# Patient Record
Sex: Male | Born: 1975
Health system: Southern US, Community
[De-identification: ages and names within clinical notes are randomized; demographics above are authoritative.]

## PROBLEM LIST (undated history)

## (undated) DIAGNOSIS — I10 Essential (primary) hypertension: Secondary | ICD-10-CM

## (undated) DIAGNOSIS — J45909 Unspecified asthma, uncomplicated: Secondary | ICD-10-CM

## (undated) DIAGNOSIS — J302 Other seasonal allergic rhinitis: Secondary | ICD-10-CM

## (undated) HISTORY — PX: OTHER SURGICAL HISTORY: SHX169

## (undated) HISTORY — DX: Unspecified asthma, uncomplicated: J45.909

## (undated) MED FILL — Dupilumab Subcutaneous Soln Auto-injector 300 MG/2ML: SUBCUTANEOUS | Fill #8 | Status: CN

---

## 2000-11-13 ENCOUNTER — Encounter: Payer: Self-pay | Admitting: *Deleted

## 2000-11-14 ENCOUNTER — Encounter: Payer: Self-pay | Admitting: Family Medicine

## 2000-11-14 ENCOUNTER — Inpatient Hospital Stay (HOSPITAL_COMMUNITY): Admission: RE | Admit: 2000-11-14 | Discharge: 2000-11-18 | Payer: Self-pay | Admitting: Family Medicine

## 2000-12-10 ENCOUNTER — Emergency Department (HOSPITAL_COMMUNITY): Admission: EM | Admit: 2000-12-10 | Discharge: 2000-12-10 | Payer: Self-pay | Admitting: *Deleted

## 2001-05-09 ENCOUNTER — Emergency Department (HOSPITAL_COMMUNITY): Admission: EM | Admit: 2001-05-09 | Discharge: 2001-05-09 | Payer: Self-pay | Admitting: Emergency Medicine

## 2001-10-19 ENCOUNTER — Emergency Department (HOSPITAL_COMMUNITY): Admission: EM | Admit: 2001-10-19 | Discharge: 2001-10-19 | Payer: Self-pay | Admitting: Emergency Medicine

## 2002-04-11 ENCOUNTER — Emergency Department (HOSPITAL_COMMUNITY): Admission: EM | Admit: 2002-04-11 | Discharge: 2002-04-11 | Payer: Self-pay | Admitting: *Deleted

## 2002-07-12 ENCOUNTER — Encounter: Payer: Self-pay | Admitting: Emergency Medicine

## 2002-07-12 ENCOUNTER — Emergency Department (HOSPITAL_COMMUNITY): Admission: EM | Admit: 2002-07-12 | Discharge: 2002-07-12 | Payer: Self-pay | Admitting: Emergency Medicine

## 2003-01-02 ENCOUNTER — Emergency Department (HOSPITAL_COMMUNITY): Admission: EM | Admit: 2003-01-02 | Discharge: 2003-01-03 | Payer: Self-pay | Admitting: Emergency Medicine

## 2007-02-15 ENCOUNTER — Emergency Department (HOSPITAL_COMMUNITY): Admission: EM | Admit: 2007-02-15 | Discharge: 2007-02-15 | Payer: Self-pay | Admitting: Emergency Medicine

## 2007-09-27 ENCOUNTER — Emergency Department (HOSPITAL_COMMUNITY): Admission: EM | Admit: 2007-09-27 | Discharge: 2007-09-28 | Payer: Self-pay | Admitting: Emergency Medicine

## 2008-04-20 ENCOUNTER — Emergency Department (HOSPITAL_COMMUNITY): Admission: EM | Admit: 2008-04-20 | Discharge: 2008-04-20 | Payer: Self-pay | Admitting: Emergency Medicine

## 2009-03-19 ENCOUNTER — Encounter: Admission: RE | Admit: 2009-03-19 | Discharge: 2009-03-19 | Payer: Self-pay | Admitting: Family Medicine

## 2009-06-08 ENCOUNTER — Emergency Department (HOSPITAL_COMMUNITY): Admission: EM | Admit: 2009-06-08 | Discharge: 2009-06-08 | Payer: Self-pay | Admitting: Emergency Medicine

## 2009-07-21 ENCOUNTER — Emergency Department (HOSPITAL_COMMUNITY): Admission: EM | Admit: 2009-07-21 | Discharge: 2009-07-21 | Payer: Self-pay | Admitting: Emergency Medicine

## 2009-10-06 ENCOUNTER — Emergency Department (HOSPITAL_COMMUNITY): Admission: EM | Admit: 2009-10-06 | Discharge: 2009-10-06 | Payer: Self-pay | Admitting: Emergency Medicine

## 2009-10-15 ENCOUNTER — Emergency Department (HOSPITAL_COMMUNITY): Admission: EM | Admit: 2009-10-15 | Discharge: 2009-10-16 | Payer: Self-pay | Admitting: Emergency Medicine

## 2009-10-16 ENCOUNTER — Emergency Department (HOSPITAL_COMMUNITY): Admission: EM | Admit: 2009-10-16 | Discharge: 2009-10-16 | Payer: Self-pay | Admitting: Emergency Medicine

## 2009-12-24 ENCOUNTER — Encounter: Admission: RE | Admit: 2009-12-24 | Discharge: 2009-12-24 | Payer: Self-pay | Admitting: Internal Medicine

## 2010-02-06 ENCOUNTER — Emergency Department (HOSPITAL_COMMUNITY): Admission: EM | Admit: 2010-02-06 | Discharge: 2010-02-06 | Payer: Self-pay | Admitting: Emergency Medicine

## 2010-03-12 ENCOUNTER — Emergency Department (HOSPITAL_COMMUNITY): Admission: EM | Admit: 2010-03-12 | Discharge: 2010-03-12 | Payer: Self-pay | Admitting: Emergency Medicine

## 2010-04-03 ENCOUNTER — Emergency Department (HOSPITAL_COMMUNITY): Admission: EM | Admit: 2010-04-03 | Discharge: 2010-04-03 | Payer: Self-pay | Admitting: Emergency Medicine

## 2010-04-11 ENCOUNTER — Emergency Department (HOSPITAL_COMMUNITY)
Admission: EM | Admit: 2010-04-11 | Discharge: 2010-04-12 | Payer: Self-pay | Source: Home / Self Care | Admitting: Emergency Medicine

## 2010-04-19 ENCOUNTER — Observation Stay (HOSPITAL_COMMUNITY): Admission: EM | Admit: 2010-04-19 | Discharge: 2010-04-20 | Payer: Self-pay | Source: Home / Self Care

## 2010-05-20 ENCOUNTER — Emergency Department (HOSPITAL_COMMUNITY)
Admission: EM | Admit: 2010-05-20 | Discharge: 2010-05-20 | Payer: Self-pay | Source: Home / Self Care | Admitting: Emergency Medicine

## 2010-05-24 ENCOUNTER — Emergency Department (HOSPITAL_COMMUNITY)
Admission: EM | Admit: 2010-05-24 | Discharge: 2010-05-24 | Payer: Self-pay | Source: Home / Self Care | Admitting: Emergency Medicine

## 2010-06-17 ENCOUNTER — Emergency Department (HOSPITAL_COMMUNITY): Payer: PRIVATE HEALTH INSURANCE

## 2010-06-17 ENCOUNTER — Emergency Department (HOSPITAL_COMMUNITY)
Admission: EM | Admit: 2010-06-17 | Discharge: 2010-06-17 | Disposition: A | Discharge status: Home / Self Care | Payer: PRIVATE HEALTH INSURANCE | Attending: Emergency Medicine | Admitting: Emergency Medicine

## 2010-06-17 DIAGNOSIS — J45909 Unspecified asthma, uncomplicated: Secondary | ICD-10-CM | POA: Insufficient documentation

## 2010-06-17 DIAGNOSIS — R0789 Other chest pain: Secondary | ICD-10-CM | POA: Insufficient documentation

## 2010-06-17 DIAGNOSIS — J40 Bronchitis, not specified as acute or chronic: Secondary | ICD-10-CM | POA: Insufficient documentation

## 2010-06-17 DIAGNOSIS — R059 Cough, unspecified: Secondary | ICD-10-CM | POA: Insufficient documentation

## 2010-06-17 DIAGNOSIS — R05 Cough: Secondary | ICD-10-CM | POA: Insufficient documentation

## 2010-07-05 ENCOUNTER — Emergency Department (HOSPITAL_COMMUNITY)
Admission: EM | Admit: 2010-07-05 | Discharge: 2010-07-05 | Disposition: A | Discharge status: Home / Self Care | Payer: PRIVATE HEALTH INSURANCE | Attending: Emergency Medicine | Admitting: Emergency Medicine

## 2010-07-05 ENCOUNTER — Emergency Department (HOSPITAL_COMMUNITY): Payer: PRIVATE HEALTH INSURANCE

## 2010-07-05 DIAGNOSIS — J45901 Unspecified asthma with (acute) exacerbation: Secondary | ICD-10-CM | POA: Insufficient documentation

## 2010-07-05 DIAGNOSIS — R0989 Other specified symptoms and signs involving the circulatory and respiratory systems: Secondary | ICD-10-CM | POA: Insufficient documentation

## 2010-07-05 DIAGNOSIS — R0609 Other forms of dyspnea: Secondary | ICD-10-CM | POA: Insufficient documentation

## 2010-07-16 ENCOUNTER — Encounter: Payer: Self-pay | Admitting: Pulmonary Disease

## 2010-07-16 ENCOUNTER — Institutional Professional Consult (permissible substitution) (INDEPENDENT_AMBULATORY_CARE_PROVIDER_SITE_OTHER): Payer: PRIVATE HEALTH INSURANCE | Admitting: Pulmonary Disease

## 2010-07-16 DIAGNOSIS — J4550 Severe persistent asthma, uncomplicated: Secondary | ICD-10-CM | POA: Insufficient documentation

## 2010-07-16 DIAGNOSIS — J45909 Unspecified asthma, uncomplicated: Secondary | ICD-10-CM

## 2010-07-16 HISTORY — DX: Severe persistent asthma, uncomplicated: J45.50

## 2010-07-20 LAB — BASIC METABOLIC PANEL
CO2: 25 mEq/L (ref 19–32)
Calcium: 9.5 mg/dL (ref 8.4–10.5)
Chloride: 107 mEq/L (ref 96–112)
Creatinine, Ser: 0.96 mg/dL (ref 0.4–1.5)
Potassium: 4.5 mEq/L (ref 3.5–5.1)

## 2010-07-20 LAB — URINALYSIS, ROUTINE W REFLEX MICROSCOPIC
Bilirubin Urine: NEGATIVE
Leukocytes, UA: NEGATIVE
Nitrite: NEGATIVE
Protein, ur: NEGATIVE mg/dL
Specific Gravity, Urine: 1.015 (ref 1.005–1.030)
pH: 7 (ref 5.0–8.0)

## 2010-07-20 LAB — DIFFERENTIAL
Basophils Relative: 0 % (ref 0–1)
Eosinophils Relative: 0 % (ref 0–5)
Lymphocytes Relative: 2 % — ABNORMAL LOW (ref 12–46)
Lymphs Abs: 0.2 10*3/uL — ABNORMAL LOW (ref 0.7–4.0)

## 2010-07-20 LAB — CBC
HCT: 42.6 % (ref 39.0–52.0)
MCHC: 34 g/dL (ref 30.0–36.0)
Platelets: 203 10*3/uL (ref 150–400)
RBC: 4.96 MIL/uL (ref 4.22–5.81)

## 2010-07-20 LAB — RAPID URINE DRUG SCREEN, HOSP PERFORMED: Opiates: NOT DETECTED

## 2010-07-21 LAB — CBC
Platelets: 170 10*3/uL (ref 150–400)
RDW: 13.2 % (ref 11.5–15.5)

## 2010-07-21 LAB — DIFFERENTIAL
Basophils Absolute: 0 10*3/uL (ref 0.0–0.1)
Lymphs Abs: 2.5 10*3/uL (ref 0.7–4.0)
Monocytes Relative: 6 % (ref 3–12)
Neutro Abs: 1.6 10*3/uL — ABNORMAL LOW (ref 1.7–7.7)
Neutrophils Relative %: 35 % — ABNORMAL LOW (ref 43–77)

## 2010-07-21 LAB — BASIC METABOLIC PANEL
CO2: 25 mEq/L (ref 19–32)
GFR calc Af Amer: >60 mL/min (ref >60)
Glucose, Bld: 99 mg/dL (ref 70–99)
Sodium: 141 mEq/L (ref 135–145)

## 2010-07-26 LAB — CBC
HCT: 41.2 % (ref 39.0–52.0)
MCHC: 33 g/dL (ref 30.0–36.0)
MCV: 92.2 fL (ref 78.0–100.0)
Platelets: 162 10*3/uL (ref 150–400)
WBC: 5.3 10*3/uL (ref 4.0–10.5)

## 2010-07-26 LAB — BASIC METABOLIC PANEL
GFR calc Af Amer: >60 mL/min (ref >60)
GFR calc non Af Amer: >60 mL/min (ref >60)

## 2010-07-26 LAB — DIFFERENTIAL
Lymphs Abs: 2.7 10*3/uL (ref 0.7–4.0)
Monocytes Relative: 8 % (ref 3–12)

## 2010-07-27 ENCOUNTER — Telehealth (INDEPENDENT_AMBULATORY_CARE_PROVIDER_SITE_OTHER): Payer: Self-pay | Admitting: *Deleted

## 2010-07-27 LAB — RAPID STREP SCREEN (MED CTR MEBANE ONLY): Streptococcus, Group A Screen (Direct): POSITIVE — AB

## 2010-07-28 NOTE — Assessment & Plan Note (Signed)
Summary: consult for asthma    Visit Type:  Initial Consult Copy to:  Renaye Rakers MD Primary Provider/Referring Provider:  Renaye Rakers MD  CC:  Pulmonary Consult. Pt states he has been having issues with his asthma. he is staying sob w/ activity and at rest and wheezing x couple months.  History of Present Illness: the pt is a 35y/o male who I have been asked to see for asthma.  The pt tells me he was diagnosed with asthma in Nov of last year, but has never had pfts.  At the time of his diagnosis, he was having sob, chest tightness, and was started on asthma meds.  Despite being compliant with his meds, he continues to have episodes of sob which can come on with rest or activity.  These are associated with chest tightness and audible wheezing.  He states he has one episode at least every other week, and he typically goes to hospital.  It is not improved by his rescue inhaler.  The pt is currently on decadron for a recent "flareup".  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  He also has chronic nasal congestion with allergic rhinitis symptoms, but has never been allergy tested.  He denies any history of reflux.  He did have a cxr last week at [facility] which showed hyperinflation but on acute process.   Preventive Screening-Counseling & Management  Alcohol-Tobacco     Smoking Status: quit  Current Medications (verified): 1)  Singulair 10 Mg Tabs (Montelukast Sodium) .Marland Kitchen.. 1 At Bedtime 2)  Symbicort 160-4.5 Mcg/act Aero (Budesonide-Formoterol Fumarate) .Marland Kitchen.. 1 Puff Once A Day 3)  Ventolin Hfa 108 (90 Base) Mcg/act Aers (Albuterol Sulfate) .... 2 Puffs As Needed 4)  Decadron (Unsure Dosage) .Marland Kitchen.. 1 Tablet Two Times A Day X 7 Days Then 1 Tablet Once Daily X 6 Days 5)  Ibuprofen 800 Mg Tabs (Ibuprofen) .... As Needed  Allergies (verified): 1)  ! Pcn  Past History:  Past Medical History: Asthma  Past Surgical History: none  Family  History: Reviewed history and no changes required. emphysema: father  Social History: Reviewed history and no changes required. Patient states former smoker. quit 04/09/2010. <1/2 ppd. started age 35 married occupation: sanitation Smoking Status:  quit  Review of Systems       The patient complains of shortness of breath with activity, shortness of breath at rest, productive cough, non-productive cough, tooth/dental problems, nasal congestion/difficulty breathing through nose, and change in color of mucus.  The patient denies coughing up blood, chest pain, irregular heartbeats, acid heartburn, indigestion, loss of appetite, weight change, abdominal pain, difficulty swallowing, sore throat, headaches, sneezing, itching, ear ache, anxiety, depression, hand/feet swelling, joint stiffness or pain, rash, and fever.    Vital Signs:  Patient profile:   35 year old male Height:      73 inches Weight:      175.38 pounds BMI:     23.22 O2 Sat:      96 % on Room air Temp:     97.8 degrees F oral Pulse rate:   70 / minute BP sitting:   124 / 72  (left arm) Cuff size:   regular  Vitals Entered By: Carver Fila (July 16, 2010 10:43 AM)  O2 Flow:  Room air CC: Pulmonary Consult. Pt states he has been having issues with his asthma. he is staying sob w/ activity and at rest, wheezing x couple months Comments meds  and allergies updated Phone number updated Carver Fila  July 16, 2010 10:44 AM    Physical Exam  General:  thin male in nad  Eyes:  PERRLA and EOMI.   Nose:  mild septal deviation to left with no obstruction no purulence or discharge Mouth:  clear, no lesons or exudates Neck:  no jvd, tmg, LN Lungs:  totally clear to auscultation  Heart:  rrr, no mrg  Abdomen:  soft and nontender, bs + Extremities:  no edema or cyanosis  pulses intact distally Neurologic:  alert and oriented, moves all 4    Impression & Recommendations:  Problem # 1:  ASTHMA (ICD-493.90) the pt has been  diagnosed with asthma on clinical grounds, but has never had pfts for documentation of airflow obstruction.  He is on a very good asthma regimen, and ongoing flareups in face of good medical compliance would make me very suspicious we are not dealing with asthma.  However, he does work in an environment with strong chemicals, and may have uncontrolled allergy symptoms which can cause flareups.  At this point, I have asked him to finish up his steroid taper, and need to see him asap next time he has worsening symptoms.  Will be able to do spirometry at that time to verify whether he truly has airflow obstruction.  I have asked him to call us for work in appt rather than going to ER (unless of course he is in distress).    Medications Added to Medication List This Visit: 1)  Singulair 10 Mg Tabs (Montelukast sodium) .Marland Kitchen.. 1 at bedtime 2)  Symbicort 160-4.5 Mcg/act Aero (Budesonide-formoterol fumarate) .Marland Kitchen.. 1 puff once a day 3)  Ventolin Hfa 108 (90 Base) Mcg/act Aers (Albuterol sulfate) .... 2 puffs as needed 4)  Decadron (unsure Dosage)  .Marland Kitchen.. 1 tablet two times a day x 7 days then 1 tablet once daily x 6 days 5)  Ibuprofen 800 Mg Tabs (Ibuprofen) .... As needed  Other Orders: Consultation Level IV (04540)  Patient Instructions: 1)  stay on symbicort 2puffs am and pm..rinse mouth well.  Take everyday no matter what 2)  stay on singulair 3)  finish up your decadron 4)  followup with me in 3 weeks, but call if start getting sick so we can get you in that day or within 24hrs.

## 2010-07-29 ENCOUNTER — Emergency Department (HOSPITAL_COMMUNITY): Payer: PRIVATE HEALTH INSURANCE

## 2010-07-29 ENCOUNTER — Emergency Department (HOSPITAL_COMMUNITY)
Admission: EM | Admit: 2010-07-29 | Discharge: 2010-07-29 | Disposition: A | Discharge status: Home / Self Care | Payer: PRIVATE HEALTH INSURANCE | Attending: Emergency Medicine | Admitting: Emergency Medicine

## 2010-07-29 DIAGNOSIS — J209 Acute bronchitis, unspecified: Secondary | ICD-10-CM | POA: Insufficient documentation

## 2010-07-29 DIAGNOSIS — R05 Cough: Secondary | ICD-10-CM | POA: Insufficient documentation

## 2010-07-29 DIAGNOSIS — R079 Chest pain, unspecified: Secondary | ICD-10-CM | POA: Insufficient documentation

## 2010-07-29 DIAGNOSIS — R059 Cough, unspecified: Secondary | ICD-10-CM | POA: Insufficient documentation

## 2010-07-29 LAB — CBC
HCT: 47.1 % (ref 39.0–52.0)
Hemoglobin: 15.7 g/dL (ref 13.0–17.0)
MCH: 30.1 pg (ref 26.0–34.0)
MCHC: 33.3 g/dL (ref 30.0–36.0)
MCV: 90.2 fL (ref 78.0–100.0)

## 2010-07-29 LAB — DIFFERENTIAL
Basophils Relative: 1 % (ref 0–1)
Lymphocytes Relative: 16 % (ref 12–46)
Monocytes Absolute: 0.5 10*3/uL (ref 0.1–1.0)
Monocytes Relative: 6 % (ref 3–12)
Neutro Abs: 5.8 10*3/uL (ref 1.7–7.7)

## 2010-07-29 LAB — POCT CARDIAC MARKERS: Myoglobin, poc: 68.5 ng/mL (ref 12–200)

## 2010-07-29 LAB — BASIC METABOLIC PANEL
CO2: 22 mEq/L (ref 19–32)
Calcium: 9.4 mg/dL (ref 8.4–10.5)
Creatinine, Ser: 1.04 mg/dL (ref 0.4–1.5)
Glucose, Bld: 121 mg/dL — ABNORMAL HIGH (ref 70–99)

## 2010-08-04 ENCOUNTER — Encounter: Payer: Self-pay | Admitting: Pulmonary Disease

## 2010-08-06 ENCOUNTER — Encounter: Payer: Self-pay | Admitting: Pulmonary Disease

## 2010-08-06 ENCOUNTER — Ambulatory Visit (INDEPENDENT_AMBULATORY_CARE_PROVIDER_SITE_OTHER): Payer: PRIVATE HEALTH INSURANCE | Admitting: Pulmonary Disease

## 2010-08-06 VITALS — BP 120/68 | HR 65 | Temp 98.2°F | Ht 75.0 in | Wt 180.2 lb

## 2010-08-06 DIAGNOSIS — J45909 Unspecified asthma, uncomplicated: Secondary | ICD-10-CM

## 2010-08-06 NOTE — Assessment & Plan Note (Signed)
The pt has had another acute sob since the last visit that does not really sound like an "asthma attack".  He is on excellent meds for asthma, but continues to have issues that may or may not be due to asthma.  He is either on prednisone, or just finishing a taper from the ER, and therefore it is difficult to get proper testing done.  He needs spirometry when he is having issues, and would also like to check a RAST to see if allergy is driving this.  He is having sinus symptoms today, so will check ct sinuses.   If pt comes in for acute visit and doesn't see me:  Needs RAST/IgE and spirometry.

## 2010-08-06 NOTE — Progress Notes (Signed)
Summary: would like to be seen today - LMTCB x 1  Phone Note Call from Patient   Caller: spouse/Susan Call For: Clance Summary of Call: Pts wife Darl Pikes phoned stated that Mr. Tullier is worse, he is having difficulty catching his breath. He stated that Dr Shelle Iron told him to call back and get worked in if he couldnt wait until his follow up 3/29. They would like to be seen today and can be reached at [phone] Initial call taken by: Vedia Coffer,  July 27, 2010 1:39 PM  Follow-up for Phone Call        Called # provided above.  Spoke with pt's wife.  States pt was told if he could not wait until appt on 3/29 to call back and he could be worked in sooner.  I then attempted to ask her if he is having problems but she said "but nevermind."  The call then ended.  I called # back - [facility]  Gweneth Dimitri RN  July 27, 2010 3:46 PM   Additional Follow-up for Phone Call Additional follow up Details #1::        will sign off on message and pt still has appt set for 08-06-10.Reynaldo Minium CMA  July 28, 2010 6:07 PM

## 2010-08-06 NOTE — Progress Notes (Signed)
  Subjective:    Patient ID: Drew Sandoval, male    DOB: [birth year], 35 y.o.   MRN: [id number]  HPI The pt comes in today for f/u of his ?asthma.  The plan was for the pt to come in today off prednisone and have spirometry and possibly rast.  He was in bed one night, and suddenly awakened with sob.  He went to the ER and was treated like an "asthma attack".  By history, this is not suggestive of acute exacerbation (?panic).  He has been treated with prednisone and feels he is at his usual baseline.  He is noting sinus pressure, congestion, and some scant purulence at times.   Review of Systems  Constitutional: Negative for fever, appetite change and unexpected weight change.  HENT: Positive for congestion, rhinorrhea, sneezing, dental problem and postnasal drip. Negative for ear pain, sore throat and trouble swallowing.   Eyes: Negative for redness.  Respiratory: Positive for shortness of breath and wheezing. Negative for cough.   Cardiovascular: Positive for palpitations. Negative for chest pain and leg swelling.  Gastrointestinal: Negative for nausea, abdominal pain and diarrhea.  Genitourinary: Negative for dysuria.  Musculoskeletal: Negative for joint swelling.  Skin: Negative for rash.  Neurological: Positive for headaches. Negative for syncope.  Hematological: Does not bruise/bleed easily.  Psychiatric/Behavioral: Negative for dysphoric mood. The patient is not nervous/anxious.        Objective:   Physical Exam Thin male in nad Nares with inflammation, edema, no purulence OP with no lesions or exudates No LN Chest totally clear to auscultation Cor with rrr, no mrg No edema or cyanosis  Alert and oriented, moves all 4        Assessment & Plan:

## 2010-08-06 NOTE — Patient Instructions (Signed)
No change in asthma meds.  Will check a scan of your sinuses followup with me in 3 weeks, but call for apptm if you get sick in the interim.

## 2010-08-07 ENCOUNTER — Ambulatory Visit (INDEPENDENT_AMBULATORY_CARE_PROVIDER_SITE_OTHER)
Admission: RE | Admit: 2010-08-07 | Discharge: 2010-08-07 | Disposition: A | Discharge status: Home / Self Care | Payer: PRIVATE HEALTH INSURANCE | Source: Ambulatory Visit | Attending: Pulmonary Disease | Admitting: Pulmonary Disease

## 2010-08-07 DIAGNOSIS — J45909 Unspecified asthma, uncomplicated: Secondary | ICD-10-CM

## 2010-08-11 ENCOUNTER — Ambulatory Visit (INDEPENDENT_AMBULATORY_CARE_PROVIDER_SITE_OTHER): Payer: PRIVATE HEALTH INSURANCE | Admitting: Adult Health

## 2010-08-11 ENCOUNTER — Other Ambulatory Visit (INDEPENDENT_AMBULATORY_CARE_PROVIDER_SITE_OTHER): Payer: PRIVATE HEALTH INSURANCE

## 2010-08-11 ENCOUNTER — Encounter: Payer: Self-pay | Admitting: Adult Health

## 2010-08-11 ENCOUNTER — Telehealth: Payer: Self-pay | Admitting: Pulmonary Disease

## 2010-08-11 DIAGNOSIS — J45909 Unspecified asthma, uncomplicated: Secondary | ICD-10-CM

## 2010-08-11 LAB — CBC WITH DIFFERENTIAL/PLATELET
Basophils Absolute: 0 10*3/uL (ref 0.0–0.1)
Eosinophils Absolute: 0.6 10*3/uL (ref 0.0–0.7)
Lymphocytes Relative: 19.3 % (ref 12.0–46.0)
MCHC: 34.1 g/dL (ref 30.0–36.0)
Monocytes Absolute: 0.7 10*3/uL (ref 0.1–1.0)
Neutro Abs: 4.3 10*3/uL (ref 1.4–7.7)
Neutrophils Relative %: 61.6 % (ref 43.0–77.0)
RDW: 14.3 % (ref 11.5–14.6)

## 2010-08-11 MED ORDER — PREDNISONE 10 MG PO TABS: ORAL_TABLET | ORAL | Status: AC

## 2010-08-11 MED ORDER — LEVOFLOXACIN 500 MG PO TABS: 500.0000 mg | ORAL_TABLET | Freq: Every day | ORAL | Status: AC

## 2010-08-11 NOTE — Telephone Encounter (Signed)
Called, spoke with pt.  He c/o increased SOB, wheezing, chest tightness, nonprod cough.  Onset last night.  Requesting appt.  Scheduled with TP for today at 3:30pm -- pt aware.  ED/UC advised if sxs worsen prior to OV.  He verbalized understanding of this.

## 2010-08-11 NOTE — Assessment & Plan Note (Addendum)
Flare: ?COPD component -he started smoking at age 35 (smoked 23+yrs ~1 PPD ) -send Alpha 1 today  FEV1 today 1.14 l/m (28%) Will need to be sorted out what is fixed and reversible once flare is over.  Will set up for full PFTS  Recent CT sinus shows acute sinusitis- will tx with abx,   Plan:  Levaquin 500mg  daily for 10 days  Mucinex DM Twice daily  As needed  Cough/congestion  Prednisone taper over next week.  Saline nasal rinses As needed   Albuterol inhaler 2 puffs every 4hr as needed for wheezing  I will call with blood work  We are sending genetic test for Emphysema  follow up in 2 weeks Dr. Shelle Iron as planned and As needed   follow up for PFT -lung function test -first available.  Please contact office for sooner follow up if symptoms do not improve or worsen or seek emergency care

## 2010-08-11 NOTE — Progress Notes (Signed)
Subjective:    Patient ID: Drew Sandoval, male    DOB: 07-07-1975, 35 y.o.   MRN: [id number]  HPI 35 yo male with dx of Asthma seen for initial pulmonary consult on 07/16/10.   07/16/10 -the pt is a 34y/o male who I have been asked to see for asthma. The pt tells me he was diagnosed with asthma in Nov of last year, but has never had pfts. At the time of his diagnosis, he was having sob, chest tightness, and was started on asthma meds. Despite being compliant with his meds, he continues to have episodes of sob which can come on with rest or activity. These are associated with chest tightness and audible wheezing. He states he has one episode at least every other week, and he typically goes to hospital. It is not improved by his rescue inhaler. The pt is currently on decadron for a recent "flareup". 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 He also has chronic nasal congestion with allergic rhinitis symptoms, but has never been allergy tested. He denies any history of reflux. He did have a cxr last week at [facility] which showed hyperinflation but on acute process.  REC: tapered off steroids, cont on symbicort.   08/11/2010 Acute OV- Presents for 1 day of acute cough-minimally productive, with wheezing and shortness of  Breath. Has increased albuterol use, 3 x yesterday. No fever, discolored mucus. Has a lot of drainage, post nasal drip. Says he has not missed any doses of Symbicort although he admits he depends on samples. Denies reflux or chest pain. Had CT sinus (3/30) which showed :  fluid within the right maxillary sinus and the sphenoid sinus consistent with sinusitis. He says he started smoking at age 35- and he quit few months ago. Father has empysema dx in early 79s. Today we did spirometry which showed FEV1 ~28% (although he is having acute flare and sinusitis).       Review of Systems Constitutional:   No  weight loss, night sweats,  Fevers,  chills, fatigue, or  lassitude.  HEENT:   No headaches,  Difficulty swallowing,  Tooth/dental problems, or  Sore throat,                Positive sneezing, itching, ear ache, nasal congestion, post nasal drip,   CV:  No chest pain,  Orthopnea, PND, swelling in lower extremities, anasarca, dizziness, palpitations, syncope.   GI  No heartburn, indigestion, abdominal pain, nausea, vomiting, diarrhea, change in bowel habits, loss of appetite, bloody stools.   Resp:    No coughing up of blood.  No change in color of mucus.     No chest wall deformity  Skin: no rash or lesions.  GU: no dysuria, change in color of urine, no urgency or frequency.  No flank pain, no hematuria   MS:  No joint pain or swelling.  No decreased range of motion.  No back pain.  Psych:  No change in mood or affect. No depression or anxiety.  No memory loss.         Objective:   Physical Exam GEN: A/Ox3; pleasant , NAD, well nourished   HEENT:  [place]/AT,  EACs-clear, TMs-wnl, NOSE-clear drainge, nontender sinus  THROAT-clear, no lesions, no postnasal drip or exudate noted.   NECK:  Supple w/ fair ROM; no JVD; normal carotid impulses w/o bruits; no thyromegaly or nodules palpated; no lymphadenopathy.  RESP  Coarse BS w/ faint exp  wheeze.no accessory muscle use, no dullness to percussion  CARD:  RRR, no m/r/g  , no peripheral edema, pulses intact, no cyanosis or clubbing.  GI:   Soft & nt; nml bowel sounds; no organomegaly or masses detected.  Musco: Warm bil, no deformities or joint swelling noted.   Neuro: alert, no focal deficits noted.    Skin: Warm, no lesions or rashes          Assessment & Plan:

## 2010-08-11 NOTE — Patient Instructions (Addendum)
Levaquin 500mg  daily for 10 days  Mucinex DM Twice daily  As needed  Cough/congestion  Prednisone taper over next week.  Saline nasal rinses As needed   Albuterol inhaler 2 puffs every 4hr as needed for wheezing  I will call with blood work  We are sending genetic test for Emphysema  follow up in 2 weeks Dr. Shelle Iron as planned and As needed   follow up for PFT -lung function test -first available.  Please contact office for sooner follow up if symptoms do not improve or worsen or seek emergency care

## 2010-08-13 ENCOUNTER — Telehealth: Payer: Self-pay | Admitting: Pulmonary Disease

## 2010-08-13 MED ORDER — CLARITHROMYCIN 500 MG PO TABS: 500.0000 mg | ORAL_TABLET | Freq: Two times a day (BID) | ORAL | Status: AC

## 2010-08-13 NOTE — Telephone Encounter (Signed)
Called to speak with pharmacy and they advised me they do not carry biaxin xl packs. Spoke with TD and Jess J about this and was to call in Biaxin 500 mg 1 bid x 10 days #20 0 refills take with food. Advised pharmacist we have attempted to contact pt about the change and was unable to reach him. Pharmacist states they will inform pt of this.

## 2010-08-13 NOTE — Telephone Encounter (Signed)
LMOMTCB X1 on cell phone and home #  Carver Fila, CMA

## 2010-08-13 NOTE — Telephone Encounter (Signed)
Pt is scheduled for pfts upcoming.  I would like to wait until he is over this episode before doing.  Megan, please cancel his upcoming pfts, but he is to keep his 2 week ov with me

## 2010-08-13 NOTE — Telephone Encounter (Signed)
Spoke w/ pt and he was put on levaquin on 08/11/10 when he saw TP and after taken the 1st dose pt states he broke out in a rash all over his body and it itches. Pt is requesting something different be called in to [facility]. Please advise Tammy. Thanks  Allergies  Allergen Reactions  . Penicillins     REACTION: rash    Carver Fila, CMA

## 2010-08-13 NOTE — Telephone Encounter (Signed)
Pt's wife called again.Darletta Moll

## 2010-08-13 NOTE — Telephone Encounter (Signed)
Biaxin XL pack for 10 days  Take with food.  Stop levaquin Please contact office for sooner follow up if symptoms do not improve or worsen or seek emergency care

## 2010-08-18 NOTE — Telephone Encounter (Signed)
Called and spoke with pt.  Pt aware to cancel pft appt but keep appt with kc for 4-19 at 10:45

## 2010-08-18 NOTE — Telephone Encounter (Signed)
ATC pt at home #.  NA and unable to leave message.  WCB.  Just need to inform pt that I have cancelled his PFT appt on 4-18 at 12pm but he needs to keep his f/u appt with [facility] for 4-19 at 10:45.

## 2010-08-21 ENCOUNTER — Encounter: Payer: Self-pay | Admitting: Adult Health

## 2010-08-25 ENCOUNTER — Encounter: Payer: Self-pay | Admitting: Pulmonary Disease

## 2010-08-25 ENCOUNTER — Encounter: Payer: Self-pay | Admitting: Adult Health

## 2010-08-26 ENCOUNTER — Emergency Department (HOSPITAL_COMMUNITY)
Admission: EM | Admit: 2010-08-26 | Discharge: 2010-08-26 | Disposition: A | Discharge status: Home / Self Care | Payer: Managed Care, Other (non HMO) | Attending: Emergency Medicine | Admitting: Emergency Medicine

## 2010-08-26 DIAGNOSIS — R05 Cough: Secondary | ICD-10-CM | POA: Insufficient documentation

## 2010-08-26 DIAGNOSIS — J45901 Unspecified asthma with (acute) exacerbation: Secondary | ICD-10-CM | POA: Insufficient documentation

## 2010-08-26 DIAGNOSIS — R059 Cough, unspecified: Secondary | ICD-10-CM | POA: Insufficient documentation

## 2010-08-26 DIAGNOSIS — R071 Chest pain on breathing: Secondary | ICD-10-CM | POA: Insufficient documentation

## 2010-08-27 ENCOUNTER — Ambulatory Visit (INDEPENDENT_AMBULATORY_CARE_PROVIDER_SITE_OTHER): Payer: PRIVATE HEALTH INSURANCE | Admitting: Pulmonary Disease

## 2010-08-27 ENCOUNTER — Encounter: Payer: Self-pay | Admitting: Pulmonary Disease

## 2010-08-27 VITALS — BP 142/88 | HR 79 | Temp 97.9°F | Ht 75.0 in | Wt 175.2 lb

## 2010-08-27 DIAGNOSIS — J45909 Unspecified asthma, uncomplicated: Secondary | ICD-10-CM

## 2010-08-27 MED ORDER — PREDNISONE 10 MG PO TABS: ORAL_TABLET | ORAL | Status: DC

## 2010-08-27 NOTE — Patient Instructions (Addendum)
Will treat with course of prednisone Continue with symbicort and singulair I have made an apptm with an allergist for 11am on 08/31/2010 followup with me in 4 weeks.

## 2010-08-27 NOTE — Progress Notes (Signed)
  Subjective:    Patient ID: Drew Sandoval, male    DOB: [birth year], 35 y.o.   MRN: [id number]  HPI The pt comes in today for an acute sick visit.  He has known asthma, and has been having very frequent ER visits with prednisone tapers.  He was recently at the ER again yesterday, and given one dose of prednisone and asked to f/u here.  He denies noncompliance with meds.  He recently had RAST testing done which showed impressive reactions to many allergens, with very high IgE as well.     Review of Systems  Constitutional: Negative for fever and unexpected weight change.  HENT: Positive for congestion, rhinorrhea, sneezing, dental problem and postnasal drip. Negative for ear pain, nosebleeds, sore throat, trouble swallowing and sinus pressure.   Eyes: Positive for itching. Negative for redness.  Respiratory: Positive for cough, chest tightness, shortness of breath and wheezing.   Cardiovascular: Negative for palpitations and leg swelling.  Gastrointestinal: Negative for nausea and vomiting.  Genitourinary: Negative for dysuria.  Musculoskeletal: Negative for joint swelling.  Skin: Negative for rash.  Neurological: Positive for headaches.  Hematological: Does not bruise/bleed easily.  Psychiatric/Behavioral: Negative for dysphoric mood. The patient is not nervous/anxious.        Objective:   Physical Exam Thin male in nad Nares without purulence or discharge OP clear Chest with wheezing and rhonchi noted throughout Cor with rrr, no mrg LE without edema, no cyanosis  Alert and oriented, moves all 4        Assessment & Plan:

## 2010-08-31 ENCOUNTER — Ambulatory Visit (INDEPENDENT_AMBULATORY_CARE_PROVIDER_SITE_OTHER): Payer: PRIVATE HEALTH INSURANCE | Admitting: Internal Medicine

## 2010-08-31 ENCOUNTER — Encounter: Payer: Self-pay | Admitting: Internal Medicine

## 2010-08-31 VITALS — BP 130/88 | HR 69 | Ht 75.0 in | Wt 179.6 lb

## 2010-08-31 DIAGNOSIS — D721 Eosinophilia, unspecified: Secondary | ICD-10-CM | POA: Insufficient documentation

## 2010-08-31 DIAGNOSIS — J320 Chronic maxillary sinusitis: Secondary | ICD-10-CM

## 2010-08-31 DIAGNOSIS — R768 Other specified abnormal immunological findings in serum: Secondary | ICD-10-CM | POA: Insufficient documentation

## 2010-08-31 DIAGNOSIS — R894 Abnormal immunological findings in specimens from other organs, systems and tissues: Secondary | ICD-10-CM

## 2010-08-31 DIAGNOSIS — J45909 Unspecified asthma, uncomplicated: Secondary | ICD-10-CM

## 2010-08-31 HISTORY — DX: Chronic maxillary sinusitis: J32.0

## 2010-08-31 MED ORDER — PHENYLEPHRINE HCL 1 % NA SOLN
3.0000 [drp] | Freq: Once | NASAL | Status: AC
  Administered 2010-08-31: 3 [drp] via NASAL

## 2010-08-31 MED ORDER — MOMETASONE FUROATE 50 MCG/ACT NA SUSP: NASAL | Status: DC

## 2010-08-31 NOTE — Assessment & Plan Note (Signed)
This would all fit if he has a chronic fungal allergic sinusitis and/or an ABPA pattern. CXR doesn't show mucus filled airways yet.  We will try getting sinuses to clear with treatment today, add Neti Pot and nasonex. All were discussed.

## 2010-08-31 NOTE — Assessment & Plan Note (Signed)
The hope is that correction of sinus disease may reduce inflammatory stimulation and allow airways to relax. He is on very reasonable meds now.

## 2010-08-31 NOTE — Progress Notes (Signed)
  Subjective:    Patient ID: Drew Sandoval, male    DOB: 03-09-1976, 35 y.o.   MRN: [id number]  HPI 74 yoM former smoker, referred by Dr Shelle Iron for allergy assessment due to hyper IgE. Hx of rhinitis and asthma. Wife is here. He says that he had no hx of asthma or allergy until around Thanksgiving, 2011. He doesn't remember any specific initial illness or event. Since then he has had persistent maxillary and frontal facial pressure, itching eyes, sneeze, nasal congestion and some postnasal drip; also persistent chest tightness witrh wheeze and dry cough. Denies problems with ears, eyes, nodes, skin or joints. Several prednisone bursts, most recent didn't make much difference, ending yesterday. He feels fairly well/ "okay" today. Always some wheeze and sinus pressure. CT sinus 08/06/10- sinusitis CBC/diff - WBC 6,900   EOS 8.5 H CXR 07/05/10- Hyperinflation c/w asthma.  Allergy Profile/ RAST- Total IgE 1,747. All specific agents on panel were elevated.   Review of Systems See HPI Constitutional:   No weight loss, night sweats,  Fevers, chills, fatigue, lassitude. HEENT:   No headaches,  Difficulty swallowing,  Tooth/dental problems,  Sore throat,                No sneezing, itching, ear ache,CV:  No chest pain,  Orthopnea, PND, swelling in lower extremities, anasarca, dizziness, palpitations  GI  No heartburn, indigestion, abdominal pain, nausea, vomiting, diarrhea, change in bowel habits, loss of appetite  Resp:.  No excess mucus, no productive cough,  No non-productive cough,  No coughing up of blood.  No change in color of mucus.  No wheezing.   Skin: no rash or lesions.  GU: no dysuria, change in color of urine, no urgency or frequency.  No flank pain.  MS:  No joint pain or swelling.  No decreased range of motion.  No back pain.  Psych:  No change in mood or affect. No depression or anxiety.  No memory loss.      Objective:   Physical Exam General- Alert, Oriented,  Affect-appropriate, Distress- none acute, quiet spoken, tall slender man  Skin- rash-none, , excoriation- none. Multiple hyperpigmented scars volar forearm attributed to an accident.  Lymphadenopathy- none  Head- atraumatic  Eyes- Gross vision intact, PERRLA, conjunctivae clear secretions. No injection or periorbital edema.  Ears- Normal- Hearing, canals, Tm L ,R ,  Nose- Clear, No- Septal dev, mucus, polyps, erosion, perforation   Throat- Mallampati II , mucosa clear , drainage- none, tonsils- atrophic  Neck- flexible , trachea midline, no stridor , thyroid nl, carotid no bruit  Chest - symmetrical excursion , unlabored     Heart/CV- RRR , no murmur , no gallop  , no rub, nl s1 s2                     - JVD- none , edema- none, stasis changes- none, varices- none     Lung- Unlabored, with distinct wheeze at right scapula, otw clear,       cough- none , dullness-none, rub- none     Chest wall- Abd- tender-no, distended-no, bowel sounds-present, HSM- no  Br/ Gen/ Rectal- Not done, not indicated  Extrem- cyanosis- none, clubbing, none, atrophy- none, strength- nl  Neuro- grossly intact to observation         Assessment & Plan:

## 2010-08-31 NOTE — Progress Notes (Signed)
Addended by: Reynaldo Minium on: 08/31/2010 02:48 PM   Modules accepted: Orders

## 2010-08-31 NOTE — Patient Instructions (Signed)
Orders-  1) neb neo nasal  2 ) Schedule return for allergy skin testing-    Antihistamines interfere with this test. For 3 days before your skin tests, please don't take any cold or allergy pills, cough meds, or otc sleep medicines. Your Singulair and the inhalers and nose spray we have given are ok to continue.  3) Sample and script for Nasonex nasal steroid spray-  2 puffs in each nostril every day at bedtime  4) Try rinsing your nasal passages with the Neti pot once or twice every day. Don't do it after the Nasonex so you don't rinse the Nasonex right out.

## 2010-09-04 ENCOUNTER — Encounter: Payer: Self-pay | Admitting: Pulmonary Disease

## 2010-09-04 NOTE — Assessment & Plan Note (Signed)
The pt has severe asthma with recurrent exacerbations that I suspect is being allergy driven.  He states that he has been compliant with his meds, and denies returning to smoking.  At this point, he will need to have a steroid taper to get him thru this acute exacerbation, but I think it is critical that he have an allergy evaluation.  More than likely he will need xolair or allergy vaccine.

## 2010-09-11 ENCOUNTER — Emergency Department (HOSPITAL_COMMUNITY)
Admission: EM | Admit: 2010-09-11 | Discharge: 2010-09-11 | Disposition: A | Discharge status: Home / Self Care | Payer: PRIVATE HEALTH INSURANCE | Attending: Emergency Medicine | Admitting: Emergency Medicine

## 2010-09-11 ENCOUNTER — Encounter: Payer: Self-pay | Admitting: Adult Health

## 2010-09-11 ENCOUNTER — Ambulatory Visit (INDEPENDENT_AMBULATORY_CARE_PROVIDER_SITE_OTHER): Payer: PRIVATE HEALTH INSURANCE | Admitting: Adult Health

## 2010-09-11 ENCOUNTER — Telehealth: Payer: Self-pay | Admitting: Pulmonary Disease

## 2010-09-11 VITALS — BP 110/84 | HR 96 | Temp 99.6°F | Ht 75.0 in | Wt 170.4 lb

## 2010-09-11 DIAGNOSIS — J45909 Unspecified asthma, uncomplicated: Secondary | ICD-10-CM | POA: Insufficient documentation

## 2010-09-11 DIAGNOSIS — R0989 Other specified symptoms and signs involving the circulatory and respiratory systems: Secondary | ICD-10-CM | POA: Insufficient documentation

## 2010-09-11 DIAGNOSIS — R0609 Other forms of dyspnea: Secondary | ICD-10-CM | POA: Insufficient documentation

## 2010-09-11 MED ORDER — PREDNISONE 10 MG PO TABS: ORAL_TABLET | ORAL | Status: AC

## 2010-09-11 MED ORDER — LEVOFLOXACIN 500 MG PO TABS: 500.0000 mg | ORAL_TABLET | Freq: Every day | ORAL | Status: AC

## 2010-09-11 NOTE — Progress Notes (Signed)
Subjective:    Patient ID: Drew Sandoval, male    DOB: 1975-11-04, 35 y.o.   MRN: [id number]  HPI 43 yoM former smoker, followed by by Dr Shelle Iron for asthma and Dr. Maple Hudson for Allergy. (Smoked from age [age] ~1/2PPD)  07/16/10 -the pt is a 34y/o male who I have been asked to see for asthma. The pt tells me he was diagnosed with asthma in Nov of last year, but has never had pfts. At the time of his diagnosis, he was having sob, chest tightness, and was started on asthma meds. Despite being compliant with his meds, he continues to have episodes of sob which can come on with rest or activity. These are associated with chest tightness and audible wheezing. He states he has one episode at least every other week, and he typically goes to hospital. It is not improved by his rescue inhaler. The pt is currently on decadron for a recent "flareup". 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 He also has chronic nasal congestion with allergic rhinitis symptoms, but has never been allergy tested. He denies any history of reflux. He did have a cxr last week at [facility] which showed hyperinflation but on acute process.  REC: tapered off steroids, cont on symbicort.   08/11/2010 Acute OV- Presents for 1 day of acute cough-minimally productive, with wheezing and shortness of  Breath. Has increased albuterol use, 3 x yesterday. No fever, discolored mucus. Has a lot of drainage, post nasal drip. Says he has not missed any doses of Symbicort although he admits he depends on samples. Denies reflux or chest pain. Had CT sinus (3/30) which showed :  fluid within the right maxillary sinus and the sphenoid sinus consistent with sinusitis. He says he started smoking at age 80- and he quit few months ago. Father has empysema dx in early 39s. Today we did spirometry which showed FEV1 ~28% (although he is having acute flare and sinusitis). >>tx w/ abx and steroid taper   08/31/10 -Allergy OV    for allergy assessment due to hyper IgE. Hx of rhinitis and asthma. Wife is here. He says that he had no hx of asthma or allergy until around Thanksgiving, 2011. He doesn't remember any specific initial illness or event. Since then he has had persistent maxillary and frontal facial pressure, itching eyes, sneeze, nasal congestion and some postnasal drip; also persistent chest tightness witrh wheeze and dry cough. Denies problems with ears, eyes, nodes, skin or joints. Several prednisone bursts, most recent didn't make much difference, ending yesterday. He feels fairly well/ "okay" today. Always some wheeze and sinus pressure. CT sinus 08/06/10- sinusitis CBC/diff - WBC 6,900   EOS 8.5 H CXR 07/05/10- Hyperinflation c/w asthma.  Allergy Profile/ RAST- Total IgE 1,747. All specific agents on panel were elevated. >>Nasonex added   09/11/10 Acute OV  Complains of increased cough, congestion, wheezing, sinus congestion increased on left side. Was seen ER yesterday given neb tx .  Says he did well for about 2 weeks but symptoms started up 1 week ago with increase ventolin use.  NO fever or chest pain. Feels tight especially with coughing.   Assures me of med compliance but uses only samples -has not filled any rx.   Was seen by Allergy 2 weeks ago started on Nasonex.   Review of Systems  See HPI Constitutional:   No weight loss, night sweats,  Fevers, chills, fatigue, lassitude. HEENT:   No headaches,  Difficulty swallowing,  Tooth/dental problems,  Sore throat,               + sneezing, itching, ear ache  CV:  No chest pain,  Orthopnea, PND, swelling in lower extremities, anasarca, dizziness, palpitations  GI  No heartburn, indigestion, abdominal pain, nausea, vomiting, diarrhea, change in bowel habits, loss of appetite  Resp:.  No excess mucus, no productive cough,  No non-productive cough,  No coughing up of blood.  No change in color of mucus.  No wheezing.   Skin: no rash or lesions.  GU:  no dysuria, change in color of urine, no urgency or frequency.  No flank pain.  MS:  No joint pain or swelling.  No decreased range of motion.  No back pain.  Psych:  No change in mood or affect. No depression or anxiety.  No memory loss.      Objective:   Physical Exam  General- Alert, Oriented, Affect-appropriate, Distress- none acute, quiet spoken, tall slender man  Skin- rash-none, , excoriation- none. Multiple hyperpigmented scars volar forearm attributed to an accident.  Lymphadenopathy- none  Head- atraumatic  Eyes- Gross vision intact, PERRLA, conjunctivae clear secretions. No injection or periorbital edema.  Ears- Normal- Hearing, canals, Tm L ,R ,  Nose- Clear, No- Septal dev, mucus, polyps, erosion, perforation , red and swollen membranes on left , frontal max tenderness   Throat- Mallampati II , mucosa clear , drainage- none, tonsils- atrophic, poor dental care  Neck- flexible , trachea midline, no stridor , thyroid nl, carotid no bruit  Chest - symmetrical excursion , unlabored     Heart/CV- RRR , no murmur , no gallop  , no rub, nl s1 s2                     - JVD- none , edema- none, stasis changes- none, varices- none     Lung- Unlabored, with exp  wheeze bilaterally.    Abd- tender-no, distended-no, bowel sounds-present, HSM- no   Extrem- cyanosis- none, clubbing, none, atrophy- none, strength- nl  Neuro- grossly intact to observation         Assessment & Plan:

## 2010-09-11 NOTE — Telephone Encounter (Signed)
Spoke w/ pt wife and she states pt was seen in the ER yesterday and was giving a breathing tx. Pt wife states pt is having a hard time breathing, coughing, and is stopped up. Pt requested to be seen. Since [facility] is not in the office pt is coming in to see TP today at 2:00

## 2010-09-11 NOTE — Patient Instructions (Addendum)
Levaquin 500mg  daily for 10 days  Mucinex DM Twice daily  For cough/congestion Saline nasal rinses Twice daily   Saline nasal gel At bedtime   Afrin Twice daily  For 3 days.  Prednisone taper over next week.  follow up 2 weeks and As needed  With dr Shelle Iron

## 2010-09-11 NOTE — Assessment & Plan Note (Addendum)
Recurrent exacerbation w/ suspected associated sinusitis May need ENT referral for sinus evaluation  Also if improved would like to repeat spirometry on return to see  If improved on tx  ? COPD vs asthma Plan:  Levaquin 500mg  daily for 10 days  Mucinex DM Twice daily  For cough/congestion Saline nasal rinses Twice daily   Saline nasal gel At bedtime   Afrin Twice daily  For 3 days.  Prednisone taper over next week.  follow up 2 weeks and As needed  With dr Shelle Iron

## 2010-09-14 ENCOUNTER — Encounter: Payer: Self-pay | Admitting: Adult Health

## 2010-09-17 ENCOUNTER — Telehealth: Payer: Self-pay | Admitting: Pulmonary Disease

## 2010-09-17 NOTE — Telephone Encounter (Signed)
Spoke w/ pt and he states his cough is not getting any better. Pt states he is getting up light green phlem. Pt is still on levaquin and will finish on Sunday and has 1 more day of prednisone left. Pt states the mucinex DM has not helped him at all. Pt requesting recs on what else to do for cough. Pt has upcoming OV 5/18 at 9:30. Please advise Dr. Shelle Iron. Thanks  Allergies  Allergen Reactions  . Penicillins     REACTION: rash    Carver Fila, CMA

## 2010-09-17 NOTE — Telephone Encounter (Signed)
KC out of office this afternoon.  Dr. Maple Hudson, pls advise.  Thanks!  Allergies  Allergen Reactions  . Penicillins     REACTION: rash

## 2010-09-18 ENCOUNTER — Emergency Department (HOSPITAL_COMMUNITY)
Admission: EM | Admit: 2010-09-18 | Discharge: 2010-09-18 | Disposition: A | Discharge status: Home / Self Care | Payer: PRIVATE HEALTH INSURANCE | Attending: Emergency Medicine | Admitting: Emergency Medicine

## 2010-09-18 DIAGNOSIS — R059 Cough, unspecified: Secondary | ICD-10-CM | POA: Insufficient documentation

## 2010-09-18 DIAGNOSIS — R05 Cough: Secondary | ICD-10-CM | POA: Insufficient documentation

## 2010-09-18 DIAGNOSIS — J45909 Unspecified asthma, uncomplicated: Secondary | ICD-10-CM | POA: Insufficient documentation

## 2010-09-18 MED ORDER — CLINDAMYCIN HCL 150 MG PO CAPS: ORAL_CAPSULE | ORAL | Status: DC

## 2010-09-18 NOTE — Telephone Encounter (Signed)
Would call in clindamycin 150mg  2 qid for 10 days.   He must really use neilmed sinus rinses twice a day to clean out sinuses.  Talk with Aundra Millet about getting him in to see me next week

## 2010-09-18 NOTE — Telephone Encounter (Signed)
Pt aware clindamycin will be sent to pharmacy and aware of directions. Pt states he has been using his neilmed sinus rinses twice a day. Pt already scheduled to come in 5/18 at 9:30 to see [facility].

## 2010-09-18 NOTE — Telephone Encounter (Signed)
I looked at this issue when he called and felt appropriate to leave till next day when Dr Shelle Iron returns.

## 2010-09-18 NOTE — Telephone Encounter (Signed)
Please advise Dr. Shelle Iron. Thanks  Carver Fila, CMA

## 2010-09-22 ENCOUNTER — Encounter: Payer: Self-pay | Admitting: Pulmonary Disease

## 2010-09-24 ENCOUNTER — Encounter: Payer: Self-pay | Admitting: Pulmonary Disease

## 2010-09-25 ENCOUNTER — Ambulatory Visit (INDEPENDENT_AMBULATORY_CARE_PROVIDER_SITE_OTHER): Payer: PRIVATE HEALTH INSURANCE | Admitting: Pulmonary Disease

## 2010-09-25 ENCOUNTER — Encounter: Payer: Self-pay | Admitting: Pulmonary Disease

## 2010-09-25 DIAGNOSIS — J45909 Unspecified asthma, uncomplicated: Secondary | ICD-10-CM

## 2010-09-25 DIAGNOSIS — J320 Chronic maxillary sinusitis: Secondary | ICD-10-CM

## 2010-09-25 NOTE — Progress Notes (Signed)
  Subjective:    Patient ID: Drew Sandoval, male    DOB: 1976/04/21, 35 y.o.   MRN: [id number]  HPI The pt comes in today for f/u of his severe asthma.  He has known allergic disease, and most recently had a flare of his asthma due to acute/chronic sinusitis.  He was treated with abx/steroids, and has returned near his usual baseline.  He has minimal cough at this time, and feels he is breathing ok.  He denies med noncompliance.    Review of Systems  Constitutional: Negative for fever and unexpected weight change.  HENT: Negative for ear pain, nosebleeds, congestion, sore throat, rhinorrhea, sneezing, trouble swallowing, dental problem, postnasal drip and sinus pressure.   Eyes: Negative for redness and itching.  Respiratory: Negative for cough, chest tightness, shortness of breath and wheezing.   Cardiovascular: Negative for palpitations and leg swelling.  Gastrointestinal: Negative for nausea and vomiting.  Genitourinary: Negative for dysuria.  Musculoskeletal: Negative for joint swelling.  Skin: Negative for rash.  Neurological: Negative for headaches.  Hematological: Does not bruise/bleed easily.  Psychiatric/Behavioral: Negative for dysphoric mood. The patient is not nervous/anxious.        Objective:   Physical Exam Wd male in nad Nares without purulence or discharge Chest is clear currently, no wheezing Cor with rrr LE without edema, no cyanosis Alert and oriented, moves all 4        Assessment & Plan:

## 2010-09-25 NOTE — Consult Note (Signed)
[place]. [facility]  Patient:    Drew Sandoval, Drew Sandoval                  MRN: [id number] Proc. Date: 11/16/00 Adm. Date:  [id number] Attending:  Beverely Low CC:         [name], M.D.   Consultation Report  HISTORY OF PRESENT ILLNESS:  The patient is a pleasant 35 year old black gentleman who I have been asked to see for probable pneumonia and an abnormal CT scan of the chest.  The patient has never had any pulmonary or dyspnea issues, until this past weekend, when he started with left-sided pleuritic chest pain, fever, increased shortness of breath, as well as cough that later produced green mucus.  The patient was seen at [facility] and was felt to have pneumonia.  He was treated with antibiotics by vein and also sent home on these.  The patient continued to have difficulties and saw Dr. Parke Simmers for evaluation and was admitted for further treatment.  The patient here has been treated with IV Tequin, as well as nebulizers for acute bronchospasm, who feels that he is definitely better.  He has undergone a CT scan of the chest to rule out a pulmonary and was negative for thromboembolic disease; however, he did have minimal hilar lymphadenopathy that is probably reactive, patchy areas of air space disease, primarily in the left upper lobe that is probably due to pneumonia, and finally, even though the radiologist mentioned something about emphysema, I really do not see anything that appears to be of significance.  PAST MEDICAL HISTORY:  Totally unremarkable.  He denies any history of hypertension, diabetes, or coronary disease.  ALLERGIES:  PENICILLIN.  SOCIAL HISTORY:  He is a Insurance account manager.  He has a history of smoking four cigars a day but no cigarettes.  REVIEW OF SYSTEMS:  As per history of present illness.  FAMILY HISTORY:  Noncontributory.  PHYSICAL EXAMINATION:  GENERAL:  He was a well-developed black male in no  acute distress.  VITAL SIGNS:  Blood pressure 110/68, pulse 81.  He is afebrile.  2 L saturation is 100%.  HEENT:  Pupils are equal, round and reactive to light and accommodation. Extraocular muscles are intact.  Nares patent without discharge.  Oropharynx is clear.  NECK:  Supple without JVD or lymphadenopathy.  There is no palpable thyromegaly.  CHEST:  Crackles primarily on the left but pops and wheezes throughout.  CARDIAC:  Regular rate and rhythm with no murmurs, rubs, or gallops.  ABDOMEN:  Soft and nontender with good bowel sounds.  GENITALIA/RECTAL/BREAST:  Not done, not indicated.  EXTREMITIES:  Lower extremities were without edema.  Good pulses distally. There was no calf tenderness.  NEUROLOGIC:  He is alert and oriented x 3 and no obvious motor deficits.  LABORATORY DATA:  Chest x-ray is totally clear.  CT scan as stated above.  His labs are currently unremarkable except for mild anemia.  His arterial blood gas on room air on admission was PO2 of 55, PCO2 of 33, pH of 7.43.  IMPRESSION:  Probable community-acquired pneumonia with acute asthmatic bronchitis.  I agree with antibiotics and nebulizers at this time.  I would also go ahead and add IV steroids, in light of his persistent bronchospasm.  I am not overly impressed by the CT findings.  His hilar lymphadenopathy is minimal and most likely is reactive.  I cannot 100% exclude mild sarcoidosis, which is usually asymptomatic.  I also do not see any significant emphysematous changes.  Of note, the patient has had PFTs at his work and the last ones were done approximately two years ago and they were normal, according to the patient.  PLAN: 1. Would continue Tequin, as well as albuterol, and add IV Solu-Medrol;    would therefore discontinue Pulmicort Respules with systemic steroids on    board. 2. Check sedimentation rate and angiotensin-converting enzyme level for    completeness. 3. Discontinue the O2 and  keep his saturations greater than or equal to 90%. 4. Stop smoking cigars. 5. Would get full PFTs, including spirometry, lung volumes, and DLCO as an    outpatient to follow this up. DD:  11/16/00 TD:  11/16/00 Job: [id number]

## 2010-09-25 NOTE — Discharge Summary (Signed)
[place]. [facility]  Patient:    Drew Sandoval, Drew Sandoval Visit Number: [id number] MRN: [id number]          Service Type: MED Location: 5000 [id number] [id number] Attending Physician:  Beverely Low Adm. Date:  [id number] Disc. Date: [id number]                             Discharge Summary  HISTORY OF PRESENT ILLNESS:  The patient is a 35 year old male who has been seen in the emergency room at [facility] and told that he had pneumonia.  He was started on medication and came to see me the next day complaining of severe shortness of breath.  He has to bend over to try to breathe.  His O2 saturations were low in our office and we felt that he needed to be hospitalized.  He was brought to the hospital and hospitalized.  His O2 was 55.4 with his pH being 7.428.  HOSPITAL COURSE:  Question of pneumonia versus pulmonary embolus was entertained.  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  The patient was started on IV antibiotics.  He was also noted to have bronchospasm and appropriate medications were started for that.  We initially started to start him on steroids with the definition of the pneumonia and the multiple focci, we were somewhat hesitant in starting steroids.  We sought consultation with pulmonology to ask if that was an appropriate method of treatment.  Dr. Shelle Iron saw the patient and he felt that it was probable COPD with acute asthmatic bronchitis.  He felt that we should add the IV steroids in light of the persistent bronchospasm.  He did not feel that the hilar adenopathy was significant, but rather reactive to what was going on with the patient.  We continued him on his IV  antibiotics and IV steroids and the patient was then able to maintain O2 saturations of 90.  His O2 was stopped.  He had an ACE done which was low at 16, so therefore not positive.  His sed rate was low at 9 also.  He continued to show improvement and we taught him how to use an inhaler and Serevent.  He was also started on decreasing prednisone and continued on Tequin and Biaxin.  Also he was informed of the findings in his lungs that showed him to have COPD at a young age of 38 and that he needed to cease his cigarette smoking immediately.  The patient was thus sent home with the diagnosis of COPD with bronchospasms, pneumonia.  He will be seen in the office in one week period of time.  He is to stay on the medications as prescribed earlier. DD:  12/26/00 TD:  12/27/00 Job: [id number]

## 2010-09-25 NOTE — Assessment & Plan Note (Signed)
The pt is getting over a recent asthma exacerbation.  He had acute/chronic sinusitis, and has been successfully treated with abx and steroids.  He has allergy testing upcoming with Dr. Maple Hudson.  I think it will be important to have ENT evaluate the pt from a nasal anatomy standpoint, since it is unlikely his asthma will stay controlled if his sinus disease is not controlled.  Allergy treatment may also help to minimize acute episodes of sinusitis.  I have again counseled the pt to not use the ER as his primary provider.  He needs to call the office if he is getting sick so that we can get him taken care of.

## 2010-09-25 NOTE — Patient Instructions (Addendum)
Continue on current meds Will refer you to ENT for evaluation of sinuses Keep allergy appt with Dr. Maple Hudson followup with me in 4 weeks.

## 2010-09-28 ENCOUNTER — Encounter: Payer: Self-pay | Admitting: Pulmonary Disease

## 2010-09-29 ENCOUNTER — Telehealth: Payer: Self-pay | Admitting: Pulmonary Disease

## 2010-09-29 MED ORDER — ALBUTEROL SULFATE HFA 108 (90 BASE) MCG/ACT IN AERS: 2.0000 | INHALATION_SPRAY | Freq: Four times a day (QID) | RESPIRATORY_TRACT | Status: DC | PRN

## 2010-09-29 NOTE — Telephone Encounter (Signed)
Refill sent. Pt aware.Drew Sandoval, CMA  

## 2010-09-30 ENCOUNTER — Telehealth: Payer: Self-pay | Admitting: Pulmonary Disease

## 2010-09-30 ENCOUNTER — Emergency Department (HOSPITAL_COMMUNITY)
Admission: EM | Admit: 2010-09-30 | Discharge: 2010-09-30 | Disposition: A | Discharge status: Home / Self Care | Payer: PRIVATE HEALTH INSURANCE | Attending: Emergency Medicine | Admitting: Emergency Medicine

## 2010-09-30 DIAGNOSIS — J45909 Unspecified asthma, uncomplicated: Secondary | ICD-10-CM | POA: Insufficient documentation

## 2010-09-30 DIAGNOSIS — T368X5A Adverse effect of other systemic antibiotics, initial encounter: Secondary | ICD-10-CM | POA: Insufficient documentation

## 2010-09-30 DIAGNOSIS — Z79899 Other long term (current) drug therapy: Secondary | ICD-10-CM | POA: Insufficient documentation

## 2010-09-30 DIAGNOSIS — B370 Candidal stomatitis: Secondary | ICD-10-CM | POA: Insufficient documentation

## 2010-09-30 NOTE — Telephone Encounter (Signed)
Pt aware of KC recs and Pt verbalized understanding

## 2010-09-30 NOTE — Telephone Encounter (Signed)
He needs to restart his clindamycin.  That is not the cause of his thrush symbicort probably is causing his thrush, and that means he is not rinsing well enough.  Needs to rinse with water, gargle, then swallow after each use of symbicort am and pm.  Brushing teeth and tongue after using symbicort helps as well.

## 2010-09-30 NOTE — Telephone Encounter (Signed)
Called, spoke with pt.  States he was seen at [facility] ED today bc his "throat was feeling funny."  States he was told he has a yeast infection on his tongue.  Per pt, he was given 2 pills in the ED and was told to stop the clindamycin and call Dr. Shelle Iron to see what he wants to do.  Allergies verified.  [facility].  Dr. Shelle Iron, pls advise.  Thanks!  Allergies  Allergen Reactions  . Penicillins     REACTION: rash

## 2010-10-03 ENCOUNTER — Inpatient Hospital Stay (HOSPITAL_COMMUNITY)
Admission: EM | Admit: 2010-10-03 | Discharge: 2010-10-05 | DRG: 202 | Disposition: A | Discharge status: Home / Self Care | Payer: PRIVATE HEALTH INSURANCE | Attending: Internal Medicine | Admitting: Internal Medicine

## 2010-10-03 ENCOUNTER — Emergency Department (HOSPITAL_COMMUNITY): Payer: PRIVATE HEALTH INSURANCE

## 2010-10-03 DIAGNOSIS — R911 Solitary pulmonary nodule: Secondary | ICD-10-CM | POA: Diagnosis present

## 2010-10-03 DIAGNOSIS — J45901 Unspecified asthma with (acute) exacerbation: Principal | ICD-10-CM | POA: Diagnosis present

## 2010-10-03 DIAGNOSIS — J189 Pneumonia, unspecified organism: Secondary | ICD-10-CM | POA: Diagnosis present

## 2010-10-03 LAB — BASIC METABOLIC PANEL
Calcium: 10.4 mg/dL (ref 8.4–10.5)
GFR calc Af Amer: >60 mL/min (ref >60)
GFR calc non Af Amer: >60 mL/min (ref >60)
Glucose, Bld: 102 mg/dL — ABNORMAL HIGH (ref 70–99)
Potassium: 4.1 mEq/L (ref 3.5–5.1)
Sodium: 139 mEq/L (ref 135–145)

## 2010-10-03 LAB — DIFFERENTIAL
Basophils Absolute: 0.1 10*3/uL (ref 0.0–0.1)
Basophils Relative: 1 % (ref 0–1)
Eosinophils Relative: 8 % — ABNORMAL HIGH (ref 0–5)
Lymphocytes Relative: 40 % (ref 12–46)
Neutro Abs: 3.9 10*3/uL (ref 1.7–7.7)

## 2010-10-03 LAB — BLOOD GAS, ARTERIAL
O2 Content: 4 L/min
Patient temperature: 37
TCO2: 18.9 mmol/L (ref 0–100)
pCO2 arterial: 42.4 mmHg (ref 35.0–45.0)
pH, Arterial: 7.333 — ABNORMAL LOW (ref 7.350–7.450)

## 2010-10-03 LAB — CBC
HCT: 47.9 % (ref 39.0–52.0)
RDW: 14 % (ref 11.5–15.5)
WBC: 9.1 10*3/uL (ref 4.0–10.5)

## 2010-10-04 ENCOUNTER — Inpatient Hospital Stay (HOSPITAL_COMMUNITY): Payer: PRIVATE HEALTH INSURANCE

## 2010-10-04 LAB — MRSA PCR SCREENING: MRSA by PCR: NEGATIVE

## 2010-10-04 LAB — BLOOD GAS, ARTERIAL
Acid-base deficit: 4.6 mmol/L — ABNORMAL HIGH (ref 0.0–2.0)
Bicarbonate: 19.9 mEq/L — ABNORMAL LOW (ref 20.0–24.0)
O2 Saturation: 94.4 %
Patient temperature: 37

## 2010-10-04 MED ORDER — IOHEXOL 350 MG/ML SOLN
100.0000 mL | Freq: Once | INTRAVENOUS | Status: AC | PRN
  Administered 2010-10-04: 100 mL via INTRAVENOUS

## 2010-10-06 NOTE — Discharge Summary (Signed)
Drew Sandoval, Drew Sandoval             ACCOUNT NO.:  [id number]  MEDICAL RECORD NO.:  [id number]           PATIENT TYPE:  I  LOCATION:  A305                          FACILITY:  [facility]  PHYSICIAN:  Jeoffrey Massed, MD    DATE OF BIRTH:  [birth year]  DATE OF ADMISSION:  10/03/2010 DATE OF DISCHARGE:  05/28/2012LH                         DISCHARGE SUMMARY-REFERRING   PRIMARY CARE PRACTITIONER:  Renaye Rakers, MD  PRIMARY PULMONOLOGIST:  Dr. Marcelyn Bruins, [facility].  PRIMARY DISCHARGE DIAGNOSES: 1. Bronchial asthma exacerbation. 2. Likely community-acquired pneumonia. 3. Small nonspecific pulmonary nodules seen on the CT scan.  May need     followup.  SECONDARY DISCHARGE DIAGNOSIS:  Bronchial asthma.  DISCHARGE MEDICATIONS: 1. Avelox 400 mg 1 tablet daily for 5 more days. 2. Prednisone 40 mg 1 tablet daily from May 28 to Oct 07, 2010. 3. Prednisone 30 mg 1 tablet daily from May 31 to October 10, 2010. 4. Prednisone 20 mg 1 tablet daily from June 3 to October 13, 2010. 5. Prednisone 10 mg 1 tablet p.o. daily from June 6 to October 16, 2010     and then discontinue. 6. Albuterol inhaler 2 puffs inhaled q.4 h. p.r.n. 7. Albuterol nebulization one vial inhale every 6 hours p.r.n. 8. Ibuprofen 200 mg tablet p.o. q.6 h p.r.n. 9. Afrin 2 sprays nasally daily at bedtime. 10.Singulair 10 mg 1 tablet p.o. twice daily. 11.Symbicort 160/4.5 mcg 1 puff inhaled daily.  CONSULTATIONS:  None.  HISTORY OF PRESENT ILLNESS:  The patient is a 35 year old black male with a history of asthma, came in for shortness of breath.  He was initially seen in by the ED by the admitting physician and admitted to a Step-Down area.  For further details, please see the history and physical that was dictated by Dr. Onalee Hua, on admission.  PERTINENT RADIOLOGICAL STUDIES: 1. X-ray of the chest done on Oct 03, 1010, showed lung is hyper     aerated, with chronic peribronchial thickening. 2. CT angiogram of the chest with  contrast showed right middle lobe     atelectasis/consolidation.  No pulmonary embolism.  Small     nonspecific pulmonary nodules which measured up to 4.6 mm.  PERTINENT LABORATORY DATA: 1. D-dimer was 1.23. 2. CBC on admission showed WBC of 9.1, hemoglobin of 6.2, and platelet     count of 202.  CHEMISTRIES ON ADMISSION:  Sodium of 139, potassium of 4.1, chloride of 105, bicarb of 24, glucose of 102, BUN of 21, creatinine of 0.85. Calcium is 10.4.  BRIEF HOSPITAL COURSE: 1. Bronchial asthma exacerbation.  The patient was admitted to the     Hospitalist Service, initially was placed in the Step-Down Unit.     However, he rapidly improved and the patient was then subsequently     transitioned to a regular floor.  He was started on Solu-Medrol and     given nebulizer treatment.  On the first day of his admission     although the patient had clear lungs.  He did appear to be hypoxic     and subsequent D-dimer was high and subsequently the patient did  undergo a CT angiogram of the chest which did not show any     pulmonary embolism, but did show a right-sided consolidation.  The     patient had persistent cough and has now been given Avelox on     discharge to complete 5 days of course.  On discharge, the patient     has clear lungs, is ambulating without any difficulty, and his     vital signs are stable.  He does not even require any oxygen to     maintain his O2 saturation.  The patient continued taking tapered     prednisone as outlined above.  The patient will follow up with his     primary care practitioner and primary pulmonologist within 1 or 2     weeks upon discharge he is to call and make an appointment. 2. Pulmonary nodule seen on CT scan.  We will defer further workup for     this to his primary care practitioner and primary pulmonologist.  DISPOSITION:  It is felt that this patient has met maximum benefit of inpatient hospitalization and is stable to be discharged  home.  FOLLOWUP INSTRUCTIONS: 1. The patient has been asked to call and make an appointment, Dr.     Renaye Rakers in 1-week time. 2. The patient has also been instructed to call Dr. Marcelyn Bruins and     make an appointment in the next 1 or 2     weeks as well.  He claims understanding. 3. Total time spent coordinating discharge 35 minutes.     Jeoffrey Massed, MD     SG/MEDQ  D:  10/05/2010  T:  10/05/2010  Job:  [id number]  cc:   Barbaraann Share, MD,FCCP [address] [place] [address]  Renaye Rakers, M.D. Fax: [phone]  Electronically Signed by Jeoffrey Massed  on 10/06/2010 05:50:48 PM

## 2010-10-09 NOTE — H&P (Signed)
Drew Sandoval, Drew Sandoval             ACCOUNT NO.:  [id number]  MEDICAL RECORD NO.:  [id number]           PATIENT TYPE:  I  LOCATION:  IC06                          FACILITY:  [facility]  PHYSICIAN:  Tarry Kos, MD       DATE OF BIRTH:  28-Sep-1975  DATE OF ADMISSION:  10/03/2010 DATE OF DISCHARGE:  LH                             HISTORY & PHYSICAL   CHIEF COMPLAINT:  Shortness of breath for 1 day.  HISTORY OF PRESENT ILLNESS:  Drew Sandoval is a 35 year old male with a history of asthma.  He came into the emergency department because he was having difficulty breathing since earlier this morning.  His shortness of breath progressively got worse along with his wheezing.  He came to the ED and was found to be in respiratory distress.  He was given several albuterol neb treatments, IV Solu-Medrol along with magnesium sulfate and he has had much improvement since his treatment in the ED and he denies having run any fevers.  He denies any productive cough. He says that he is much better and is breathing.  He is breathing a lot easier.  Denies any nausea, vomiting, or diarrhea.  No rashes.  REVIEW OF SYSTEMS:  Otherwise negative.  PAST MEDICAL HISTORY:  Asthma.  MEDICATIONS:  He takes Symbicort, albuterol, and Singulair.  He says he is compliant with these.  ALLERGIES:  PENICILLIN causes a rash.  SOCIAL HISTORY:  He does not smoke.  No alcohol.  No IV drug abuse.  PHYSICAL EXAMINATION:  VITAL SIGNS:  Temperature 97.7.  His initial O2 sats are 89% on room air.  Blood pressure 149/105, pulse initially 117, respiration was as high as 40 now it has normalized.  It is down to 15. GENERAL:  He is alert and oriented x4.  No apparent distress right now, cooperative and friendly. HEENT:  Extraocular movements are intact.  Pupils are equal and reactive to light.  Oropharynx is clear.  Mucous membranes is moist. NECK: No JVD.  No carotid bruits. CARDIOVASCULAR:  Regular rate and rhythm without  murmurs or gallops. CHEST:  Clear to auscultation bilaterally with no wheezes, rhonchi, or rales right now.  He has got very good air movement now. ABDOMEN:  Soft, nontender, nondistended.  Positive bowel sounds.  No hepatosplenomegaly. EXTREMITIES:  No clubbing, cyanosis, or edema. PSYCHIATRIC:  Normal affect. NEUROLOGICAL:  No focal neurologic deficits.  LABORATORY DATA:  Labs chest x-ray is negative for any infiltrate.  ABG initially was on 4 liters of nasal cannula, pH of 7.33, pCO2 of 42.4, pO2 of 67.5.  BMP is normal.  CBC is normal.  ASSESSMENT/PLAN:  This is a 35 year old male with acute asthma exacerbation.  Acute asthma exacerbation.  He has had marked improvement in the ED already.  He has already gotten Solu-Medrol IV currently he is not have any wheezing.  I am going to put him on prednisone 30 mg twice a day.  Give him q.2-hour albuterol neb treatments overnight and monitor him in the step-down unit.  He will probably be able to be obviously discharged tomorrow.  I will also repeat ABG in the  morning.  At discharge, I would obviously discharge him on steroids.  I will not institute antibiotics at this time as it is not indicated.  There is no source of infection on his chest x-ray.  He is afebrile.  His white count is normal.  The patient is full code.  Further recommendations pending on overall hospital course.                                           ______________________________ Tarry Kos, MD     RD/MEDQ  D:  10/03/2010  T:  10/04/2010  Job:  [id number]  Electronically Signed by Tarry Kos MD on 10/09/2010 11:50:29 AM

## 2010-10-19 ENCOUNTER — Ambulatory Visit (INDEPENDENT_AMBULATORY_CARE_PROVIDER_SITE_OTHER): Payer: PRIVATE HEALTH INSURANCE | Admitting: Internal Medicine

## 2010-10-19 ENCOUNTER — Telehealth: Payer: Self-pay | Admitting: Pulmonary Disease

## 2010-10-19 ENCOUNTER — Inpatient Hospital Stay (HOSPITAL_COMMUNITY): Payer: 59

## 2010-10-19 ENCOUNTER — Inpatient Hospital Stay (HOSPITAL_COMMUNITY)
Admission: AD | Admit: 2010-10-19 | Discharge: 2010-10-24 | DRG: 208 | Disposition: A | Discharge status: Home / Self Care | Payer: 59 | Source: Ambulatory Visit | Attending: Emergency Medicine | Admitting: Emergency Medicine

## 2010-10-19 ENCOUNTER — Encounter: Payer: Self-pay | Admitting: Internal Medicine

## 2010-10-19 VITALS — BP 120/88 | HR 88 | Temp 98.0°F | Ht 75.0 in | Wt 176.6 lb

## 2010-10-19 DIAGNOSIS — J45902 Unspecified asthma with status asthmaticus: Secondary | ICD-10-CM

## 2010-10-19 DIAGNOSIS — E876 Hypokalemia: Secondary | ICD-10-CM | POA: Diagnosis present

## 2010-10-19 DIAGNOSIS — J45909 Unspecified asthma, uncomplicated: Secondary | ICD-10-CM | POA: Insufficient documentation

## 2010-10-19 DIAGNOSIS — J9600 Acute respiratory failure, unspecified whether with hypoxia or hypercapnia: Principal | ICD-10-CM | POA: Diagnosis present

## 2010-10-19 DIAGNOSIS — IMO0002 Reserved for concepts with insufficient information to code with codable children: Secondary | ICD-10-CM | POA: Diagnosis present

## 2010-10-19 DIAGNOSIS — F3289 Other specified depressive episodes: Secondary | ICD-10-CM | POA: Diagnosis present

## 2010-10-19 DIAGNOSIS — R509 Fever, unspecified: Secondary | ICD-10-CM | POA: Diagnosis present

## 2010-10-19 DIAGNOSIS — I10 Essential (primary) hypertension: Secondary | ICD-10-CM | POA: Diagnosis present

## 2010-10-19 DIAGNOSIS — F329 Major depressive disorder, single episode, unspecified: Secondary | ICD-10-CM

## 2010-10-19 DIAGNOSIS — R7309 Other abnormal glucose: Secondary | ICD-10-CM | POA: Diagnosis not present

## 2010-10-19 DIAGNOSIS — F341 Dysthymic disorder: Secondary | ICD-10-CM

## 2010-10-19 DIAGNOSIS — I498 Other specified cardiac arrhythmias: Secondary | ICD-10-CM | POA: Diagnosis not present

## 2010-10-19 HISTORY — DX: Unspecified asthma with status asthmaticus: J45.902

## 2010-10-19 LAB — BLOOD GAS, ARTERIAL
Acid-Base Excess: 1.1 mmol/L (ref 0.0–2.0)
Bicarbonate: 25.3 meq/L — ABNORMAL HIGH (ref 20.0–24.0)
Bicarbonate: 28.1 mEq/L — ABNORMAL HIGH (ref 20.0–24.0)
Drawn by: 129801
Drawn by: 129801
O2 Content: 2 L/min
O2 Saturation: 92.6 %
O2 Saturation: 99.4 %
PEEP: 5 cmH2O
Patient temperature: 98.6
Patient temperature: 98.6
RATE: 10 resp/min
TCO2: 22.5 mmol/L (ref 0–100)
TCO2: 25.9 mmol/L (ref 0–100)
pCO2 arterial: 40.9 mmHg (ref 35.0–45.0)
pCO2 arterial: 70.5 mmHg (ref 35.0–45.0)
pH, Arterial: 7.224 — ABNORMAL LOW (ref 7.350–7.450)
pH, Arterial: 7.408 (ref 7.350–7.450)
pO2, Arterial: 66.6 mmHg — ABNORMAL LOW (ref 80.0–100.0)

## 2010-10-19 LAB — CBC
HCT: 42.3 % (ref 39.0–52.0)
Hemoglobin: 13.7 g/dL (ref 13.0–17.0)
MCHC: 32.4 g/dL (ref 30.0–36.0)
MCV: 89.1 fL (ref 78.0–100.0)

## 2010-10-19 LAB — BASIC METABOLIC PANEL
CO2: 28 mEq/L (ref 19–32)
Calcium: 9 mg/dL (ref 8.4–10.5)
Creatinine, Ser: 0.83 mg/dL (ref 0.4–1.5)
Glucose, Bld: 85 mg/dL (ref 70–99)

## 2010-10-19 LAB — HEPATIC FUNCTION PANEL
ALT: 10 U/L (ref 0–53)
Albumin: 3.6 g/dL (ref 3.5–5.2)
Alkaline Phosphatase: 61 U/L (ref 39–117)
Indirect Bilirubin: 0.3 mg/dL (ref 0.3–0.9)
Total Protein: 6.5 g/dL (ref 6.0–8.3)

## 2010-10-19 LAB — DIFFERENTIAL
Basophils Absolute: 0.1 10*3/uL (ref 0.0–0.1)
Lymphocytes Relative: 32 % (ref 12–46)
Lymphs Abs: 3.5 10*3/uL (ref 0.7–4.0)
Monocytes Absolute: 0.9 10*3/uL (ref 0.1–1.0)
Neutro Abs: 5.3 10*3/uL (ref 1.7–7.7)

## 2010-10-19 MED ORDER — LEVALBUTEROL HCL 1.25 MG/3ML IN NEBU
1.2500 mg | INHALATION_SOLUTION | Freq: Once | RESPIRATORY_TRACT | Status: AC
  Administered 2010-10-19: 1.25 mg via RESPIRATORY_TRACT

## 2010-10-19 MED ORDER — METHYLPREDNISOLONE ACETATE 80 MG/ML IJ SUSP
120.0000 mg | Freq: Once | INTRAMUSCULAR | Status: AC
  Administered 2010-10-19: 120 mg via INTRAMUSCULAR

## 2010-10-19 NOTE — Assessment & Plan Note (Signed)
Threatened to kill himself in office when I mentioned admission. This is in reaction to disease burden. He was sobbing and crying  Plan Need to explore further  Does not appear suicidal - does not likely need sitter Might need pscyh consult

## 2010-10-19 NOTE — Assessment & Plan Note (Addendum)
xopenex in office Dept medrol 120mg  IM given at 10:35 am in office Aggressive nebs in ICU step down IV steroids IV abx - doxy Check fungal HP panel and investigate for workplace asthma Check ACE level  40 minutes critical care service provided Monitor with spirometry for response Check basic labs Admit ICU

## 2010-10-19 NOTE — Progress Notes (Signed)
Addended by: Darrell Jewel on: 10/19/2010 11:56 AM   Modules accepted: Orders

## 2010-10-19 NOTE — Patient Instructions (Signed)
Admit icu [facility]

## 2010-10-19 NOTE — Telephone Encounter (Signed)
Called and spoke with pt's spouse.  Spouse states pt is having increased sob and is requesting an appt.  KC out of office this week.  Scheduled pt to see MR today at 9:15am

## 2010-10-19 NOTE — Telephone Encounter (Signed)
ATC pt x 3.  NA and no option to leave message. WCB

## 2010-10-19 NOTE — Progress Notes (Addendum)
Subjective:    Patient ID: Drew Sandoval, male    DOB: 01/03/1976, 35 y.o.   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 Gives hx of flour dust at work in another area but possibly in the air of the room he enters. Diagnosed with asthma thanksgiving 2011. Uses symbicort 1 puff bid and singulair. Non-smolker. Baseline igE 1747 - allergy test pending., Alpha 1 MM phenotype  He was admitted 5/26-5/28/2012 at [facility] for asthma exacerbation and possible CAP (PE ruled out, CT chest no ILD like sarcoid) and per hx states he was nearly intubated in ER. Discharged with prednisone taper ending 10/16/2010. Was feeling well. Last night, acute dyspnea at work with wheezing, cough, chest tightness and multiple albuterol use. Presents acutely to office with wife. Spirometry shows FEv1 0.83L/20% (was 1.14L/20% on 08/11/2010). Denies fever, sputum, edema, chest pain.  Past Medical History  Diagnosis Date  . Asthma      Family History  Problem Relation Age of Onset  . Emphysema Father      History   Social History  . Marital Status: Married    Spouse Name: N/A    Number of Children: N/A  . Years of Education: N/A   Occupational History  . sanitation    Social History Main Topics  . Smoking status: Former Smoker -- 0.5 packs/day for 11.5 years    Types: Cigarettes    Quit date: 04/09/2010  . Smokeless tobacco: Never Used   Comment: started at age 81.  1/2 ppd.    . Alcohol Use: No  . Drug Use: No  . Sexually Active: Not on file   Other Topics Concern  . Not on file   Social History Narrative  . No narrative on file     Allergies  Allergen Reactions  . Penicillins     REACTION: rash     Outpatient Prescriptions Prior to Visit  Medication Sig Dispense Refill  . albuterol (VENTOLIN HFA) 108 (90 BASE)  MCG/ACT inhaler Inhale 2 puffs into the lungs every 6 (six) hours as needed for wheezing. Take 2 puffs as needed  1 Inhaler  2  . budesonide-formoterol (SYMBICORT) 160-4.5 MCG/ACT inhaler Inhale 2 puffs into the lungs 2 (two) times daily.       . mometasone (NASONEX) 50 MCG/ACT nasal spray 2 prays each nostril, once daily at bedtime  17 g  2  . montelukast (SINGULAIR) 10 MG tablet Take 10 mg by mouth at bedtime.        Marland Kitchen ibuprofen (ADVIL,MOTRIN) 200 MG tablet Take as needed       . clindamycin (CLEOCIN) 150 MG capsule 2 capsules four times a day  80 capsule  0      Review of Systems  Constitutional: Negative for fever and unexpected weight change.  HENT: Positive for congestion, rhinorrhea, sneezing, dental problem, postnasal drip and sinus pressure. Negative for ear pain, nosebleeds, sore throat and trouble swallowing.   Eyes: Negative for redness and itching.  Respiratory: Positive for cough, chest tightness and shortness of breath. Negative for wheezing.   Cardiovascular: Negative for palpitations and leg swelling.  Gastrointestinal: Negative for nausea and vomiting.  Genitourinary: Negative for dysuria.  Musculoskeletal: Negative for joint swelling.  Skin: Negative for rash.  Neurological: Positive for headaches.  Hematological: Does not bruise/bleed easily.  Psychiatric/Behavioral: Negative for dysphoric mood. The patient is not nervous/anxious.        Objective:   Physical Exam  Nursing note and vitals reviewed. Constitutional: He is oriented to person, place, and time. He appears well-developed and well-nourished. No distress.  HENT:  Head: Normocephalic and atraumatic.  Right Ear: External ear normal.  Left Ear: External ear normal.  Mouth/Throat: Oropharynx is clear and moist. No oropharyngeal exudate.  Eyes: Conjunctivae and EOM are normal. Pupils are equal, round, and reactive to light. Right eye exhibits no discharge. Left eye exhibits no discharge. No scleral icterus.    Neck: Normal range of motion. Neck supple. No JVD present. No tracheal deviation present. No thyromegaly present.  Cardiovascular: Normal rate, regular rhythm and intact distal pulses.  Exam reveals no gallop and no friction rub.   No murmur heard. Pulmonary/Chest: He is in respiratory distress. He has wheezes. He has no rales. He exhibits no tenderness.       Using acc muscle Full sentennces Wheeze with diminshed air entry +  Abdominal: Soft. Bowel sounds are normal. He exhibits no distension and no mass. There is no tenderness. There is no rebound and no guarding.  Musculoskeletal: Normal range of motion. He exhibits no edema and no tenderness.  Lymphadenopathy:    He has no cervical adenopathy.  Neurological: He is alert and oriented to person, place, and time. He has normal reflexes. No cranial nerve deficit. Coordination normal.  Skin: Skin is warm and dry. No rash noted. He is not diaphoretic. No erythema. No pallor.  Psychiatric: Judgment and thought content normal.       Crying when I said I had to admit him Stated he would rather kill himself rather than go trhough this diseae Wife upset at chronicity of problems and caregiver inability to help him Initially refused admit Later agreed for admission          Assessment & Plan:

## 2010-10-20 ENCOUNTER — Inpatient Hospital Stay (HOSPITAL_COMMUNITY): Payer: 59

## 2010-10-20 LAB — GLUCOSE, CAPILLARY
Glucose-Capillary: 121 mg/dL — ABNORMAL HIGH (ref 70–99)
Glucose-Capillary: 121 mg/dL — ABNORMAL HIGH (ref 70–99)

## 2010-10-20 LAB — BLOOD GAS, ARTERIAL
Acid-base deficit: 0.8 mmol/L (ref 0.0–2.0)
Acid-base deficit: 0.6 mmol/L (ref 0.0–2.0)
Drawn by: 129801
FIO2: 0.4 %
FIO2: 0.4 %
MECHVT: 680 mL
MECHVT: 680 mL
O2 Saturation: 91.3 %
Patient temperature: 98.6
RATE: 12 resp/min
TCO2: 23.9 mmol/L (ref 0–100)
TCO2: 23.9 mmol/L (ref 0–100)
pCO2 arterial: 56.6 mmHg — ABNORMAL HIGH (ref 35.0–45.0)
pCO2 arterial: 54 mmHg — ABNORMAL HIGH (ref 35.0–45.0)

## 2010-10-20 LAB — BASIC METABOLIC PANEL
BUN: 16 mg/dL (ref 6–23)
CO2: 27 mEq/L (ref 19–32)
Chloride: 104 mEq/L (ref 96–112)
Creatinine, Ser: 1.11 mg/dL (ref 0.4–1.5)
Glucose, Bld: 148 mg/dL — ABNORMAL HIGH (ref 70–99)

## 2010-10-20 LAB — PHOSPHORUS: Phosphorus: 3.2 mg/dL (ref 2.3–4.6)

## 2010-10-20 LAB — CBC
HCT: 41.2 % (ref 39.0–52.0)
Hemoglobin: 13 g/dL (ref 13.0–17.0)
MCH: 29.1 pg (ref 26.0–34.0)
MCV: 92.4 fL (ref 78.0–100.0)
RBC: 4.46 MIL/uL (ref 4.22–5.81)

## 2010-10-20 LAB — DIFFERENTIAL
Eosinophils Absolute: 0.1 10*3/uL (ref 0.0–0.7)
Lymphocytes Relative: 4 % — ABNORMAL LOW (ref 12–46)
Lymphs Abs: 0.6 10*3/uL — ABNORMAL LOW (ref 0.7–4.0)
Monocytes Relative: 1 % — ABNORMAL LOW (ref 3–12)
Neutro Abs: 15.1 10*3/uL — ABNORMAL HIGH (ref 1.7–7.7)
Neutrophils Relative %: 95 % — ABNORMAL HIGH (ref 43–77)

## 2010-10-20 LAB — MAGNESIUM: Magnesium: 2.2 mg/dL (ref 1.5–2.5)

## 2010-10-20 LAB — RAPID URINE DRUG SCREEN, HOSP PERFORMED
Barbiturates: NOT DETECTED
Benzodiazepines: POSITIVE — AB

## 2010-10-20 NOTE — Telephone Encounter (Signed)
ATC number given again and NA, no option to leave a msg. Pt is currently being taken care of in ICU. Will sign encounter.

## 2010-10-21 ENCOUNTER — Inpatient Hospital Stay (HOSPITAL_COMMUNITY): Payer: 59

## 2010-10-21 ENCOUNTER — Institutional Professional Consult (permissible substitution): Payer: PRIVATE HEALTH INSURANCE | Admitting: Internal Medicine

## 2010-10-21 LAB — BLOOD GAS, ARTERIAL
Acid-Base Excess: 2.2 mmol/L — ABNORMAL HIGH (ref 0.0–2.0)
Bicarbonate: 26.2 mEq/L — ABNORMAL HIGH (ref 20.0–24.0)
FIO2: 0.4 %
MECHVT: 0.68 mL
MECHVT: 680 mL
O2 Saturation: 93.3 %
O2 Saturation: 95.1 %
PEEP: 8 cmH2O
Patient temperature: 98.2
Patient temperature: 98.6
TCO2: 24.4 mmol/L (ref 0–100)
TCO2: 24.8 mmol/L (ref 0–100)

## 2010-10-21 LAB — GLUCOSE, CAPILLARY
Glucose-Capillary: 132 mg/dL — ABNORMAL HIGH (ref 70–99)
Glucose-Capillary: 134 mg/dL — ABNORMAL HIGH (ref 70–99)
Glucose-Capillary: 154 mg/dL — ABNORMAL HIGH (ref 70–99)
Glucose-Capillary: 157 mg/dL — ABNORMAL HIGH (ref 70–99)

## 2010-10-21 LAB — CBC
Hemoglobin: 12 g/dL — ABNORMAL LOW (ref 13.0–17.0)
MCH: 29.1 pg (ref 26.0–34.0)
Platelets: 160 10*3/uL (ref 150–400)
RBC: 4.12 MIL/uL — ABNORMAL LOW (ref 4.22–5.81)
WBC: 20.1 10*3/uL — ABNORMAL HIGH (ref 4.0–10.5)

## 2010-10-21 LAB — COMPREHENSIVE METABOLIC PANEL
AST: 14 U/L (ref 0–37)
Albumin: 3.2 g/dL — ABNORMAL LOW (ref 3.5–5.2)
Alkaline Phosphatase: 55 U/L (ref 39–117)
BUN: 19 mg/dL (ref 6–23)
Chloride: 105 mEq/L (ref 96–112)
Potassium: 4.6 mEq/L (ref 3.5–5.1)
Total Protein: 6.2 g/dL (ref 6.0–8.3)

## 2010-10-21 LAB — PROCALCITONIN: Procalcitonin: <0.1 ng/mL

## 2010-10-21 LAB — VANCOMYCIN, TROUGH: Vancomycin Tr: 21 ug/mL — ABNORMAL HIGH (ref 10.0–20.0)

## 2010-10-21 LAB — PHOSPHORUS: Phosphorus: 4.5 mg/dL (ref 2.3–4.6)

## 2010-10-21 LAB — MAGNESIUM: Magnesium: 2.5 mg/dL (ref 1.5–2.5)

## 2010-10-21 LAB — LACTIC ACID, PLASMA: Lactic Acid, Venous: 2.2 mmol/L (ref 0.5–2.2)

## 2010-10-22 LAB — CBC
HCT: 40.3 % (ref 39.0–52.0)
Hemoglobin: 12.8 g/dL — ABNORMAL LOW (ref 13.0–17.0)
MCH: 28.9 pg (ref 26.0–34.0)
MCHC: 31.8 g/dL (ref 30.0–36.0)
MCV: 91 fL (ref 78.0–100.0)
RDW: 14.3 % (ref 11.5–15.5)

## 2010-10-22 LAB — BLOOD GAS, ARTERIAL
Acid-base deficit: 0.7 mmol/L (ref 0.0–2.0)
Bicarbonate: 22.7 mEq/L (ref 20.0–24.0)
O2 Saturation: 93.5 %
Patient temperature: 99.4
TCO2: 20.1 mmol/L (ref 0–100)
pH, Arterial: 7.416 (ref 7.350–7.450)

## 2010-10-22 LAB — GLUCOSE, CAPILLARY
Glucose-Capillary: 151 mg/dL — ABNORMAL HIGH (ref 70–99)
Glucose-Capillary: 113 mg/dL — ABNORMAL HIGH (ref 70–99)
Glucose-Capillary: 97 mg/dL (ref 70–99)

## 2010-10-22 LAB — COMPREHENSIVE METABOLIC PANEL
Albumin: 3.1 g/dL — ABNORMAL LOW (ref 3.5–5.2)
Alkaline Phosphatase: 56 U/L (ref 39–117)
BUN: 24 mg/dL — ABNORMAL HIGH (ref 6–23)
Creatinine, Ser: 0.98 mg/dL (ref 0.4–1.5)
GFR calc non Af Amer: >60 mL/min (ref >60)
Glucose, Bld: 152 mg/dL — ABNORMAL HIGH (ref 70–99)
Potassium: 3.7 mEq/L (ref 3.5–5.1)
Total Bilirubin: 0.2 mg/dL — ABNORMAL LOW (ref 0.3–1.2)
Total Protein: 7 g/dL (ref 6.0–8.3)

## 2010-10-22 LAB — URINE CULTURE: Special Requests: NEGATIVE

## 2010-10-23 ENCOUNTER — Inpatient Hospital Stay (HOSPITAL_COMMUNITY): Payer: 59

## 2010-10-23 ENCOUNTER — Telehealth: Payer: Self-pay | Admitting: Pulmonary Disease

## 2010-10-23 LAB — GLUCOSE, CAPILLARY
Glucose-Capillary: 105 mg/dL — ABNORMAL HIGH (ref 70–99)
Glucose-Capillary: 111 mg/dL — ABNORMAL HIGH (ref 70–99)
Glucose-Capillary: 114 mg/dL — ABNORMAL HIGH (ref 70–99)
Glucose-Capillary: 115 mg/dL — ABNORMAL HIGH (ref 70–99)

## 2010-10-23 NOTE — Telephone Encounter (Signed)
Per Aundra Millet, okay for pt to keep the 6.19.12 appt w/ KC.  Time was changed to and the open 12N slot was held d/t the overbook.  ATC Brandi to inform her that pt may keep this appt, but NA.

## 2010-10-24 LAB — GLUCOSE, CAPILLARY
Glucose-Capillary: 121 mg/dL — ABNORMAL HIGH (ref 70–99)
Glucose-Capillary: 130 mg/dL — ABNORMAL HIGH (ref 70–99)

## 2010-10-26 LAB — GLUCOSE, CAPILLARY: Glucose-Capillary: 116 mg/dL — ABNORMAL HIGH (ref 70–99)

## 2010-10-26 NOTE — Telephone Encounter (Signed)
ATC pt to verify that he is aware of appt date and time. Called number listed as his home number and NA, no option to leave a msg. Called cell number and  msg states this number is no longer in service. WCB later.

## 2010-10-27 ENCOUNTER — Encounter: Payer: Self-pay | Admitting: Pulmonary Disease

## 2010-10-27 ENCOUNTER — Ambulatory Visit (INDEPENDENT_AMBULATORY_CARE_PROVIDER_SITE_OTHER): Payer: 59 | Admitting: Pulmonary Disease

## 2010-10-27 DIAGNOSIS — J45902 Unspecified asthma with status asthmaticus: Secondary | ICD-10-CM

## 2010-10-27 DIAGNOSIS — J45909 Unspecified asthma, uncomplicated: Secondary | ICD-10-CM

## 2010-10-27 LAB — CULTURE, BLOOD (ROUTINE X 2): Culture  Setup Time: 201206132329

## 2010-10-27 NOTE — Patient Instructions (Signed)
You will follow up with Dr. Maple Hudson at 1:15pm on June 27th. Change your prednisone dose to 30mg  each day for 2 days, then 20mg  each day for 2 days, then 10mg  each day until you see Dr. Maple Hudson. Continue on dulera and singulair.   Please view xolair disc prior to visit with Dr. Maple Hudson. followup with me in 8 weeks.

## 2010-10-27 NOTE — Progress Notes (Signed)
  Subjective:    Patient ID: Drew Sandoval, male    DOB: 1976/03/06, 35 y.o.   MRN: [id number]  HPI The pt comes in today for posthospital f/u.  He recently had acute asthma exacerbation requiring ETI and vent short term.  He was discharged on high dose prednisone, and his symbicort changed to dulera.  He is doing well currently, and feels he is at his "good baseline".  Denies congestion, cough, or purulence.  He has significant allergic disease, and is due for f/u with Dr. Maple Hudson.    Review of Systems  Constitutional: Negative for fever and unexpected weight change.  HENT: Positive for sore throat. Negative for ear pain, nosebleeds, congestion, rhinorrhea, sneezing, trouble swallowing, dental problem, postnasal drip and sinus pressure.   Eyes: Negative for redness and itching.  Respiratory: Positive for cough. Negative for chest tightness, shortness of breath and wheezing.   Cardiovascular: Negative for palpitations and leg swelling.  Gastrointestinal: Negative for nausea and vomiting.  Genitourinary: Negative for dysuria.  Musculoskeletal: Negative for joint swelling.  Skin: Negative for rash.  Neurological: Negative for headaches.  Hematological: Does not bruise/bleed easily.  Psychiatric/Behavioral: Negative for dysphoric mood. The patient is not nervous/anxious.        Objective:   Physical Exam Thin male in nad Nares patent without discharge, no purulence Chest totally clear to auscultation Cor with rrr LE without edema, no cyanosis noted. Alert and oriented, moves all 4        Assessment & Plan:

## 2010-10-27 NOTE — Telephone Encounter (Signed)
Pt has an appt today with KC. Carron Curie, CMA'

## 2010-10-31 NOTE — Assessment & Plan Note (Signed)
The pt is s/p episode of status asthmaticus requiring short term ETI with ventilation.  He is much improved since discharge, and feels he is at a well functioning baseline currently.

## 2010-10-31 NOTE — Assessment & Plan Note (Signed)
The pt has significant asthma with a large allergy component.  It is unclear if his work environment around [employer] play a role here as well.  He is doing well on his current regimen, and we need to get his prednisone dosing tapered very quickly.  He needs to follow through with his allergy evaluation with Dr. Maple Hudson, and I have given him a xolair information DVD to view prior to his next visit with Dr. Maple Hudson so they can discuss.

## 2010-11-02 ENCOUNTER — Encounter: Payer: Self-pay | Admitting: Internal Medicine

## 2010-11-04 ENCOUNTER — Telehealth: Payer: Self-pay | Admitting: Internal Medicine

## 2010-11-04 ENCOUNTER — Encounter: Payer: Self-pay | Admitting: Internal Medicine

## 2010-11-04 ENCOUNTER — Telehealth: Payer: Self-pay | Admitting: Pulmonary Disease

## 2010-11-04 ENCOUNTER — Ambulatory Visit (INDEPENDENT_AMBULATORY_CARE_PROVIDER_SITE_OTHER): Payer: 59 | Admitting: Internal Medicine

## 2010-11-04 VITALS — BP 128/86 | HR 70 | Ht 75.0 in | Wt 188.6 lb

## 2010-11-04 DIAGNOSIS — J45909 Unspecified asthma, uncomplicated: Secondary | ICD-10-CM

## 2010-11-04 MED ORDER — EPINEPHRINE 0.3 MG/0.3ML IJ DEVI: 0.3000 mg | Freq: Once | INTRAMUSCULAR | Status: AC | PRN

## 2010-11-04 NOTE — Telephone Encounter (Signed)
Spoke with pt. He was just seen today for allergy eval with Dr. Maple Hudson. He is c/o swelling in both ankles x 1 wk. Denies any other new complaints. Would like to know KC's recs on this. Please advise, thanks!

## 2010-11-04 NOTE — Patient Instructions (Addendum)
We will start allergy shots as discussed. I will have the allergy lab call you when your vaccine is ready.  Script for Epipen  I suggest you strongly consider changing to a job without the irritant/ bleach exposure.

## 2010-11-04 NOTE — Telephone Encounter (Signed)
Error - duplicate

## 2010-11-04 NOTE — Progress Notes (Signed)
  Subjective:    Patient ID: Drew Sandoval, male    DOB: November 01, 1975, 35 y.o.   MRN: [id number]  HPI 11/04/10- 47 yo former smoker followed for severe asthma, chronic sinusitis, hype-reosinophilia and hyper IgE Sent to me by Dr Shelle Iron for allergy evaluation. 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 Allergy profile- 08/11/10 IgE 1,747 with broad specific elevation Last here- August 31, 2010.- note reviewed. Since last here has been in hospital again, getting out 2 weeks ago. Feels well today.  Comes today as planned for allergy skin testing Skin test: Strongly positive especially grass, tree, dust mite  Review of Systems Constitutional:   No weight loss, night sweats,  Fevers, chills, fatigue, lassitude. HEENT:   No headaches,  Difficulty swallowing,  Tooth/dental problems,  Sore throat,                No sneezing, itching, ear ache,                                Little  nasal congestion, post nasal drip,   CV:  No chest pain, orthopnea, PND, swelling in lower extremities, anasarca, dizziness, palpitations  GI  No heartburn, indigestion, abdominal pain, nausea, vomiting, diarrhea, change in bowel habits, loss of appetite  Resp: Today-  Denies shortness of breath with exertion or at rest today.  No excess mucus, no productive cough,  No non-productive cough,  No coughing up of blood.  No change in color of mucus.  No wheezing today.  Skin: no rash or lesions.  GU: no dysuria, change in color of urine, no urgency or frequency.  No flank pain.  MS:  No joint pain or swelling.  No decreased range of motion.  No back pain.  Psych:  No change in mood or affect. No depression or anxiety.  No memory loss.      Objective:   Physical Exam General- Alert, Oriented, Affect-appropriate, Distress- none acute  Tall, trim  Skin- rash-none, lesions- none, excoriation- none  Lymphadenopathy- none  Head- atraumatic  Eyes- Gross vision intact, PERRLA,  conjunctivae clear secretions  Ears- Hearing, canals, Tm - normal  Nose- Clear, No- Septal dev, mucus, polyps, erosion, perforation   Throat- Mallampati II , mucosa clear , drainage- none, tonsils- atrophic  Neck- flexible , trachea midline, no stridor , thyroid nl, carotid no bruit  Chest - symmetrical excursion , unlabored     Heart/CV- RRR , no murmur , no gallop  , no rub, nl s1 s2                     - JVD- none , edema- none, stasis changes- none, varices- none     Lung- clear to P&A, wheeze- none, cough- none , dullness-none, rub- none   Completely clear today     Chest wall-   Abd- tender-no, distended-no, bowel sounds-present, HSM- no  Br/ Gen/ Rectal- Not done, not indicated  Extrem- cyanosis- none, clubbing, none, atrophy- none, strength- nl  Neuro- grossly intact to observation         Assessment & Plan:

## 2010-11-04 NOTE — Telephone Encounter (Signed)
Let pt know that his breathing issues should not make his ankles swell, but the prednisone can do this at high doses.  Now that he is at lower dose, this should get better.  Make sure to keep feet elevated when sitting down.  If swelling persists,will need to see his primary md for evaluation.

## 2010-11-04 NOTE — Assessment & Plan Note (Addendum)
This is the kind of pattern suggestive of sustained reaginic/ IgE allergy stimulation, as seen with ABPA. His IgE level is out of range for Xolair at this time. With his wife here, we discussed opportunity to try standard allergy vaccine hyposensitization here. I was careful, detailed and frank about risk of anaphyllaxis, use of Epipen, and realistic expectations. He wishes to try vaccine.  We discussed dust control measures and irritant exposure mitigation including potential to get a different job without bleach exposure.  He does want to start allergy shots.

## 2010-11-04 NOTE — Telephone Encounter (Signed)
Spoke with pt and notified of recs per KC He verbalized understanding and denied any questions 

## 2010-11-08 NOTE — Assessment & Plan Note (Signed)
I explained potential for cumulative risk factors and suggested he and his wife think about job options without irritant exposure.

## 2010-11-10 ENCOUNTER — Ambulatory Visit (INDEPENDENT_AMBULATORY_CARE_PROVIDER_SITE_OTHER): Payer: 59

## 2010-11-10 DIAGNOSIS — J309 Allergic rhinitis, unspecified: Secondary | ICD-10-CM

## 2010-11-13 ENCOUNTER — Ambulatory Visit (INDEPENDENT_AMBULATORY_CARE_PROVIDER_SITE_OTHER): Payer: 59

## 2010-11-13 DIAGNOSIS — J309 Allergic rhinitis, unspecified: Secondary | ICD-10-CM

## 2010-11-16 ENCOUNTER — Encounter: Payer: Self-pay | Admitting: Internal Medicine

## 2010-11-17 ENCOUNTER — Emergency Department (HOSPITAL_COMMUNITY)
Admission: EM | Admit: 2010-11-17 | Discharge: 2010-11-17 | Disposition: A | Discharge status: Home / Self Care | Payer: 59 | Attending: Emergency Medicine | Admitting: Emergency Medicine

## 2010-11-17 ENCOUNTER — Emergency Department (HOSPITAL_COMMUNITY): Payer: 59

## 2010-11-17 ENCOUNTER — Ambulatory Visit (INDEPENDENT_AMBULATORY_CARE_PROVIDER_SITE_OTHER): Payer: 59

## 2010-11-17 ENCOUNTER — Encounter (HOSPITAL_COMMUNITY): Payer: Self-pay

## 2010-11-17 DIAGNOSIS — J309 Allergic rhinitis, unspecified: Secondary | ICD-10-CM

## 2010-11-17 DIAGNOSIS — Z87891 Personal history of nicotine dependence: Secondary | ICD-10-CM | POA: Insufficient documentation

## 2010-11-17 DIAGNOSIS — J45909 Unspecified asthma, uncomplicated: Secondary | ICD-10-CM

## 2010-11-17 HISTORY — DX: Other seasonal allergic rhinitis: J30.2

## 2010-11-17 MED ORDER — PREDNISONE 20 MG PO TABS
60.0000 mg | ORAL_TABLET | Freq: Once | ORAL | Status: AC
  Administered 2010-11-17: 60 mg via ORAL
  Filled 2010-11-17: qty 3

## 2010-11-17 MED ORDER — PREDNISONE 10 MG PO TABS: 20.0000 mg | ORAL_TABLET | Freq: Every day | ORAL | Status: DC

## 2010-11-17 MED ORDER — PREDNISONE 10 MG PO TABS: 20.0000 mg | ORAL_TABLET | Freq: Every day | ORAL | Status: AC

## 2010-11-17 MED ORDER — ALBUTEROL SULFATE (2.5 MG/3ML) 0.083% IN NEBU
INHALATION_SOLUTION | RESPIRATORY_TRACT | Status: AC
  Filled 2010-11-17: qty 3

## 2010-11-17 MED ORDER — IPRATROPIUM BROMIDE 0.02 % IN SOLN
RESPIRATORY_TRACT | Status: AC
  Filled 2010-11-17: qty 2.5

## 2010-11-17 MED ORDER — IPRATROPIUM BROMIDE 0.02 % IN SOLN: 0.5000 mg | Freq: Once | RESPIRATORY_TRACT | Status: DC

## 2010-11-17 MED ORDER — ALBUTEROL SULFATE (5 MG/ML) 0.5% IN NEBU: 5.0000 mg | INHALATION_SOLUTION | Freq: Once | RESPIRATORY_TRACT | Status: DC

## 2010-11-17 NOTE — ED Notes (Signed)
Patient standing in room. Airway patent. Respirations even and nonlabored. Resp in room to give breathing treatment. No acute distress noted. No verbalized requests given.

## 2010-11-17 NOTE — ED Notes (Signed)
Patient was given ordered albuterol and atrovent breathing treatment by Ladona Horns RT. Unable to remove from order screen.

## 2010-11-17 NOTE — ED Notes (Signed)
C/o sob for 1 week. Increases with activity and does not change with rest. nad noted in triage.

## 2010-11-17 NOTE — ED Notes (Signed)
Patient resting quietly in bed. Airway patent. Respirations even and nonlabored. No acute distress noted. No verbalized requests given. 

## 2010-11-17 NOTE — ED Provider Notes (Addendum)
History     Chief Complaint  Patient presents with  . Shortness of Breath   Patient is a 35 y.o. male presenting with shortness of breath. The history is provided by the patient.  Shortness of Breath  The current episode started 5 to 7 days ago. The problem occurs continuously. The problem has been unchanged. The problem is mild. The symptoms are relieved by nothing. The symptoms are aggravated by nothing. Associated symptoms include cough, shortness of breath and wheezing. Pertinent negatives include no chest pain, no fever and no rhinorrhea. His past medical history is significant for asthma.    Past Medical History  Diagnosis Date  . Asthma   . Seasonal allergies     History reviewed. No pertinent past surgical history.  Family History  Problem Relation Age of Onset  . Emphysema Father     History  Substance Use Topics  . Smoking status: Former Smoker -- 0.5 packs/day for 11.5 years    Types: Cigarettes    Quit date: 04/09/2010  . Smokeless tobacco: Never Used   Comment: started at age 29.  1/2 ppd.    . Alcohol Use: No      Review of Systems  Constitutional: Negative for fever.  HENT: Negative for rhinorrhea.   Respiratory: Positive for cough, shortness of breath and wheezing.   Cardiovascular: Negative for chest pain.  All other systems reviewed and are negative.    Physical Exam  BP 102/88  Pulse 87  Temp(Src) 97.7 F (36.5 C) (Oral)  Resp 14  Ht 6\' 3"  (1.905 m)  Wt 177 lb (80.287 kg)  BMI 22.12 kg/m2  SpO2 94%  Physical Exam  Nursing note and vitals reviewed. Constitutional: He is oriented to person, place, and time. Vital signs are normal. He appears well-developed and well-nourished.  Non-toxic appearance. No distress.  HENT:  Head: Normocephalic and atraumatic.  Eyes: Conjunctivae and EOM are normal. Pupils are equal, round, and reactive to light.  Neck: Normal range of motion. Neck supple. No tracheal deviation present.  Cardiovascular:  Normal rate, regular rhythm and normal heart sounds.  Exam reveals no gallop.   No murmur heard. Pulmonary/Chest: Effort normal. No stridor. No respiratory distress. He has wheezes.  Abdominal: Soft. Normal appearance and bowel sounds are normal. He exhibits no distension. There is no tenderness. There is no rebound.  Musculoskeletal: Normal range of motion. He exhibits no edema and no tenderness.  Neurological: He is alert and oriented to person, place, and time. He has normal strength. No cranial nerve deficit or sensory deficit. GCS eye subscore is 4. GCS verbal subscore is 5. GCS motor subscore is 6.  Skin: Skin is warm and dry.  Psychiatric: He has a normal mood and affect. His speech is normal and behavior is normal.    ED Course  Procedures  MDM  will give pt albuterol tx and prednisone, will f/u his pulmonolgist this week      Toy Baker, MD 11/17/10 1209  Toy Baker, MD 12/19/10 1715

## 2010-11-18 ENCOUNTER — Ambulatory Visit: Payer: PRIVATE HEALTH INSURANCE | Admitting: Internal Medicine

## 2010-11-18 NOTE — Discharge Summary (Addendum)
NAMEDEBORAH, Drew Sandoval             ACCOUNT NO.:  [id number]  MEDICAL RECORD NO.:  [id number]  LOCATION:  1316                         FACILITY:  [facility]  PHYSICIAN:  Kalman Shan, MD   DATE OF BIRTH:  06/15/1975  DATE OF ADMISSION:  10/19/2010 DATE OF DISCHARGE:  10/24/2010                              DISCHARGE SUMMARY   DISCHARGE DIAGNOSES: 1. Acute respiratory failure secondary to status asthmaticus. 2. Agitation/concern for depression. 3. Supraventricular tachycardia/hypertension. 4. Fever. 5. Hypokalemia. 6. Hyperglycemia.  LINES/TUBES:  Oral endotracheal tube from June 11th to June 14th at which time the patient self-extubated.  MICRO DATA: 1. MRSA PCR on June 11th is negative. 2. Lactate on June 11th and June 12th are normal. 3. On June 13th, procalcitonin is 0.1 and lactate is 2.2. 4. Blood cultures x2 on June 13th demonstrate no growth to date 5. Urine culture is negative on June 13th.  ANTIBIOTIC DATA:1. The patient was placed on vancomycin from June 11th to June 14th     for acute respiratory failure and asthmatic exacerbation. 2. Avelox from June 11th until June 16th.  At this practice, the patient was placed on proton pump inhibitor for stress ulcer prophylaxis and subcutaneous heparin for DVT prophylaxis.  PROTOCOL/CONSULTANTS:  Sedation protocol on June 11th until June 13th and Precedex from June 13th to 14th.  KEY EVENTS/STUDIES:  Urine drug screen positive for benzodiazepines. June 15th, full PFTs demonstrated FEV-1 of 1.5 which is 34% predicted forced vital capacity of 2.66 which is 50% predicted with a positive bronchodilator change.  DLCO is 57, this indicates severe obstruction.  HISTORY OF PRESENT ILLNESS:  Drew Sandoval is a 35 year old male with a past medical history of asthma who was recently admitted at [facility] for asthmatic exacerbation from May 26th to May 28th with a question of possible community-acquired pneumonia.  Post  discharge, he was feeling well and his prednisone taper was completed on June 8th.  On June 10th, he developed acute dyspnea while at work with wheezing, cough, and chest tightness.  This did not improve with short-acting beta agonist.  He presented to the pulmonary office on June 11th with acute dyspnea.  Spirometry in office on June 11th demonstrated an FEV-1 of 1.14 to 0.83 L.  He was admitted in acute respiratory distress and within 2 hours of arrival in the intensive care unit required intubation for progressive respiratory failure.  He was maintained on mechanical ventilation from June 11th to June 14th at which time he self-extubated. Culture data to date are negative.  He was placed on empiric vancomycin and Avelox for asthmatic exacerbation.  He was also placed on high-dose steroids.  Postextubation, he continued to improve from a respiratory standpoint and was transitioned from IV to p.o. regimen.  He previously was on Symbicort prior to admission for asthma maintenance, however, during this hospitalization, he was changed to [facility].  He indicates that prior to hospitalization he required using his short-acting beta agonist approximately 3-4 times daily.  He also indicates that the periods that he is on prednisone, he virtually has no symptoms of asthma; however, after tapering off, he notices repeat flare of symptoms.  This is something to be  considered as an outpatient in discussion with Dr. Shelle Iron whether or not Mr. Kaspar would benefit from low-dose prednisone therapy.  He currently will be tapered on a very slow prednisone taper.  He also will be continued on home Singulair. The patient at time of discharge indicates he does not need any prescriptions for refill of pulmonary medications. Also to be considered at time of followup with the CT of the chest/chest x-ray for low DLCO and possible pneumonia.  This can be reviewed with Dr. Shelle Iron at time of followup.  Mr. Salvia's  hospital course was complicated by transient SVT.  This resolved with position change and Cardizem.  His albuterol during the hospitalization was changed to Xopenex.  However, it was at this time his heart rate is ranging in the 60s and 70s and do not believe that he is having any negative cardiac effects of the albuterol at home and he will continue on it post discharge.  Mr. Loveland did have significant agitation and anxiety during hospitalization.  Apparently on admission in the pulmonary office, he said that "he would rather kill himself than be admitted" and cried inconsolably as a reaction to hospital admission.  Unfortunately, this is one of his weeks of vacation during the year and he spent it at the hospital.  Postextubation, he had a normal mental status and is not keen to see Psychiatry, however, is pending evaluation.  At time of dictation, Mr. Dillenburg is awake, alert, and oriented.  He is very pleasant and does have somewhat guarded/flat affect, however, with continued discussion he does open up and is cooperative to education regarding asthma.  Mr. Frazer works at [employer] for 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  It was discussed that he does have an option of face mask during work hours when near chemicals, strongly encouraged that he needs to wear this at all times anytime he is exposed to noxious chemicals if possible.  LABORATORY DATA:  Blood cultures x2 on June 13th demonstrate no growth on preliminary results, final results pending.  On June 14th, CMP demonstrates sodium 141, potassium 3.7, chloride 105, CO2 of 23, glucose 152, BUN 24, creatinine 0.98; total bilirubin 0.2, alkaline phosphatase 56, AST 17, ALT 13, total protein 7, albumin 3.1.  on June 14th, CBC demonstrates WBC 16.7, RBC 4.43, hemoglobin 12.8, hematocrit 40.3, platelet count 166.  RADIOLOGIC DATA:  On June 13th, chest x-ray  demonstrates low lung volumes with bibasilar atelectasis.  HOSPITAL COURSE BY DISCHARGE DIAGNOSES: 1. Acute respiratory failure secondary to status asthmaticus.  As per     HPI, Mr. Mcinerny was admitted on June 11th after being seen in the     pulmonary office with acute asthma exacerbation.  Symptoms     progressed to respiratory failure, requiring mechanical     ventilation.  He did unfortunately self-extubated on June 14th, his     respiratory status continued to improve post extubation.  Please     see history of present illness for details.  At time of discharge,     he will continue on a slow prednisone taper over 3 weeks.  He will     be changed from Symbicort to [facility] and continues home Singulair     and albuterol as needed.  He does have his medications at home.     This was reviewed at time of discharge.  He indicates he does not     need  prescriptions. 2. Agitation/concern for depression.  Mr. Leather did demonstrate     inconsolable crying after being told that he would have to be     admitted to the hospital and apparently made statements concerning     for self-harm, however, after discussing with him postextubation,     he was exasperated regarding health status, he wants to be well,     and indicates he has no suicidal ideations at this time. 3. Supraventricular tachycardia/hypertension.  Mr. Sobotka did have a     transient episode of supraventricular tachycardia during     hospitalization.  This resolved with Cardizem and position changing     as well as albuterol and Xopenex.  This was thought to likely be     secondary to acute stress.  No further needs/medications were     warranted during hospitalization.  At time of discharge, it was     felt that he can go back to home albuterol as he was previously     tolerating this without difficulty.  During hospital course, he did     have blood pressures ranging in the 140 systolic.  This needs to be     reviewed for  possible hypertension with primary care provider at     time of followup. 4. Fever.  He did have mild fever with negative sepsis biomarkers that     did resolve.  Pan cultures are negative. 5. Hypokalemia.  This was transient during hospital course and     replaced as needed. 6. Hyperglycemia secondary to systemic steroids.  It is anticipated     that this will resolve as steroids are tapered down.  DISCHARGE INSTRUCTIONS: 1. Return to work after being seen by Dr. Shelle Iron on June 19th. 2. Activity, increase activity slowly and as tolerated.  It is     recommended at time of discharge that Mr. 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  He     was given a prescription for protective mask at work as the     facility apparently does provide them to the workers if     prescription available. 3. Diet, no restrictions.  FOLLOWUP:  He is scheduled to follow up with Dr. Shelle Iron at [facility] on June 19th at 9:30 a.m.  DISCHARGE MEDICATIONS: 1. Prednisone 10 mg tablets 6 tablets for 3 days, then 4 tablets for 3     days, then 3 tablets for 3 days, then 2 tablets for 3 days, and 1     tablet for 3 days, and half tab for 3 days and stop. 2. Dulera 200/5 mcg 2 puffs inhaled twice daily. 3. Albuterol inhaler 2 puffs inhaled every 4 hours as needed. 4. Albuterol nebulization 1 puff inhaled every 6 hours as needed. 5. Singulair 10 mg 1 tablet by mouth daily.  The patient has been instructed to stop taking the following medications, 1. Aspirin. 2. Symbicort  DISPOSITION AT TIME OF DISCHARGE:  Mr. Nou has met maximum benefit of inpatient therapy and is currently medically stable and cleared for discharge pending followup with Dr. Shelle Iron.     Canary Brim, NP   ______________________________ Kalman Shan, MD    BO/MEDQ  D:  10/23/2010  T:  10/24/2010  Job:  [id number]  cc:   Renaye Rakers, M.D. Fax: [phone]  Barbaraann Share,  MD,FCCP [address] [place] [address]  Electronically Signed by Canary Brim  on  11/18/2010 09:17:24 AM Electronically Signed by Kalman Shan MD on 11/26/2010 02:57:42 AM MedRecNo: [id number] [facility], Account: [id number], DocSeq: [id number] 35 min discharge planning Electronically Signed by Kalman Shan MD on 11/26/2010 02:57:35 AM

## 2010-11-20 ENCOUNTER — Ambulatory Visit (INDEPENDENT_AMBULATORY_CARE_PROVIDER_SITE_OTHER): Payer: 59

## 2010-11-20 DIAGNOSIS — J309 Allergic rhinitis, unspecified: Secondary | ICD-10-CM

## 2010-11-24 ENCOUNTER — Ambulatory Visit (INDEPENDENT_AMBULATORY_CARE_PROVIDER_SITE_OTHER): Payer: 59

## 2010-11-24 DIAGNOSIS — J309 Allergic rhinitis, unspecified: Secondary | ICD-10-CM

## 2010-11-25 DIAGNOSIS — J329 Chronic sinusitis, unspecified: Secondary | ICD-10-CM | POA: Insufficient documentation

## 2010-11-25 DIAGNOSIS — R51 Headache: Secondary | ICD-10-CM | POA: Insufficient documentation

## 2010-11-25 DIAGNOSIS — J45909 Unspecified asthma, uncomplicated: Secondary | ICD-10-CM | POA: Insufficient documentation

## 2010-11-25 DIAGNOSIS — Z87891 Personal history of nicotine dependence: Secondary | ICD-10-CM | POA: Insufficient documentation

## 2010-11-26 ENCOUNTER — Encounter (HOSPITAL_COMMUNITY): Payer: Self-pay

## 2010-11-26 ENCOUNTER — Emergency Department (HOSPITAL_COMMUNITY)
Admission: EM | Admit: 2010-11-26 | Discharge: 2010-11-26 | Disposition: A | Discharge status: Home / Self Care | Payer: 59 | Attending: Emergency Medicine | Admitting: Emergency Medicine

## 2010-11-26 ENCOUNTER — Telehealth: Payer: Self-pay | Admitting: Pulmonary Disease

## 2010-11-26 DIAGNOSIS — R51 Headache: Secondary | ICD-10-CM

## 2010-11-26 DIAGNOSIS — J329 Chronic sinusitis, unspecified: Secondary | ICD-10-CM

## 2010-11-26 DIAGNOSIS — J45909 Unspecified asthma, uncomplicated: Secondary | ICD-10-CM

## 2010-11-26 MED ORDER — IPRATROPIUM BROMIDE 0.02 % IN SOLN
0.5000 mg | Freq: Once | RESPIRATORY_TRACT | Status: AC
  Administered 2010-11-26: 0.5 mg via RESPIRATORY_TRACT
  Filled 2010-11-26: qty 2.5

## 2010-11-26 MED ORDER — ALBUTEROL SULFATE (2.5 MG/3ML) 0.083% IN NEBU
INHALATION_SOLUTION | RESPIRATORY_TRACT | Status: AC
  Administered 2010-11-26: 2.5 mg via RESPIRATORY_TRACT
  Filled 2010-11-26: qty 6

## 2010-11-26 MED ORDER — SULFAMETHOXAZOLE-TRIMETHOPRIM 800-160 MG PO TABS: 1.0000 | ORAL_TABLET | Freq: Two times a day (BID) | ORAL | Status: AC

## 2010-11-26 MED ORDER — HYDROCODONE-ACETAMINOPHEN 5-325 MG PO TABS
1.0000 | ORAL_TABLET | Freq: Once | ORAL | Status: AC
  Administered 2010-11-26: 1 via ORAL
  Filled 2010-11-26: qty 1

## 2010-11-26 MED ORDER — ALBUTEROL SULFATE (5 MG/ML) 0.5% IN NEBU
5.0000 mg | INHALATION_SOLUTION | Freq: Once | RESPIRATORY_TRACT | Status: AC
  Administered 2010-11-26: 2.5 mg via RESPIRATORY_TRACT

## 2010-11-26 MED ORDER — SULFAMETHOXAZOLE-TMP DS 800-160 MG PO TABS
1.0000 | ORAL_TABLET | Freq: Once | ORAL | Status: AC
  Administered 2010-11-26: 1 via ORAL
  Filled 2010-11-26: qty 1

## 2010-11-26 MED ORDER — IBUPROFEN 800 MG PO TABS
800.0000 mg | ORAL_TABLET | Freq: Once | ORAL | Status: AC
  Administered 2010-11-26: 800 mg via ORAL
  Filled 2010-11-26: qty 1

## 2010-11-26 NOTE — Telephone Encounter (Signed)
Spoke with pt's spouse. She states that he was seen in ED yesterday for cough and SOB and this does not seem to be improving. She states that earlier when she called, he was out of breath and coughing a lot. ? If he is coughing up sputum, and now he is sleeping. She is requesting appt with [facility] for tomorrow, nothing available with him so sched pt to see TP at 9:45 am. I advised to seek emergency care in the meantime if needed. She verbalized understanding.

## 2010-11-26 NOTE — Progress Notes (Signed)
Patient feeling better. Ready for discharge

## 2010-11-26 NOTE — ED Provider Notes (Signed)
History     Chief Complaint  Patient presents with  . Facial Pain   HPI Comments: Patient with h/o asthma and recurrent sinusitis recently treated with albuterol and prednisone. Followed by Dr. Shelle Iron for pulmonology. Patient states albuterol and prednisone have not helped sinusitis or headache associated with sinusitis. Denies fever, chills, chest pain. Has mild cough associated with the wheezing. Sinusitis with increased light green mucus.  Patient is a 35 y.o. male presenting with sinusitis.  Sinusitis  This is a chronic problem. The current episode started more than 1 week ago. The problem has not changed since onset.There has been no fever. The pain is at a severity of 6/10. The pain is moderate. The pain has been constant since onset. Associated symptoms include congestion, sinus pressure and cough. Associated symptoms comments: headache. He has tried other medications and inhaler use for the symptoms. The treatment provided mild relief.    Past Medical History  Diagnosis Date  . Asthma   . Seasonal allergies     History reviewed. No pertinent past surgical history.  Family History  Problem Relation Age of Onset  . Emphysema Father     History  Substance Use Topics  . Smoking status: Former Smoker -- 0.5 packs/day for 11.5 years    Types: Cigarettes    Quit date: 04/09/2010  . Smokeless tobacco: Never Used   Comment: started at age 89.  1/2 ppd.    . Alcohol Use: No      Review of Systems  HENT: Positive for congestion and sinus pressure.   Respiratory: Positive for cough and wheezing.   Neurological: Positive for headaches.  All other systems reviewed and are negative.    Physical Exam  BP 156/94  Pulse 88  Temp(Src) 97.8 F (36.6 C) (Oral)  Resp 20  Ht 6\' 3"  (1.905 m)  Wt 177 lb (80.287 kg)  BMI 22.12 kg/m2  SpO2 98%  Physical Exam  Nursing note and vitals reviewed. Constitutional: He appears well-nourished. No distress.  HENT:  Head:  Normocephalic and atraumatic.       Mild facial tenderness to palpation over maxillary sinuses, no pain with palpation or percussion to frontal sinuses  Pulmonary/Chest: Effort normal. He has wheezes. He has no rales.  Abdominal: Soft.    ED Course  Procedures  MDM       Nicoletta Dress. Colon Branch, MD 11/26/10 [id number]

## 2010-11-26 NOTE — ED Notes (Signed)
Sinus headache, congestion for one week

## 2010-11-27 ENCOUNTER — Ambulatory Visit (INDEPENDENT_AMBULATORY_CARE_PROVIDER_SITE_OTHER): Payer: 59 | Admitting: Adult Health

## 2010-11-27 ENCOUNTER — Encounter: Payer: Self-pay | Admitting: *Deleted

## 2010-11-27 ENCOUNTER — Encounter: Payer: Self-pay | Admitting: Adult Health

## 2010-11-27 ENCOUNTER — Ambulatory Visit (INDEPENDENT_AMBULATORY_CARE_PROVIDER_SITE_OTHER): Payer: 59

## 2010-11-27 VITALS — BP 106/74 | HR 116 | Temp 98.9°F | Ht 75.0 in | Wt 170.0 lb

## 2010-11-27 DIAGNOSIS — J309 Allergic rhinitis, unspecified: Secondary | ICD-10-CM

## 2010-11-27 DIAGNOSIS — J45909 Unspecified asthma, uncomplicated: Secondary | ICD-10-CM

## 2010-11-27 MED ORDER — PREDNISONE 10 MG PO TABS: ORAL_TABLET | ORAL | Status: AC

## 2010-11-27 MED ORDER — MOMETASONE FURO-FORMOTEROL FUM 200-5 MCG/ACT IN AERO: 2.0000 | INHALATION_SPRAY | Freq: Two times a day (BID) | RESPIRATORY_TRACT | Status: DC

## 2010-11-27 NOTE — Progress Notes (Signed)
  Subjective:    Patient ID: Drew Sandoval, male    DOB: [birth year], 35 y.o.   MRN: [id number]  HPI 35 yo AAM with difficult to control severe asthma Acute Resp Failure w/ short term vent support 10/2010  11/04/10- 37 yo former smoker followed for severe asthma, chronic sinusitis, hype-reosinophilia and hyper IgE Sent to me by Dr Shelle Iron for allergy evaluation. 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 Allergy profile- 08/11/10 IgE 1,747 with broad specific elevation Last here- August 31, 2010.- note reviewed. Since last here has been in hospital again, getting out 2 weeks ago. Feels well today.  Comes today as planned for allergy skin testing Skin test: Strongly positive especially grass, tree, dust mite >>started on allergy vaccine twice weekly  11/27/2010 Acute OV  Pt complains of sinus pressure/congestion, prod cough with clear mucus x5days - was given bactrim 2 days ago by [name] Seen on 11/11/10 in ER as well given steroid taper.  PT continues to have frequent flares with frequent ER visits. We discussed possible triggers-he does have dog inside.  Discussed hypoallergenic pillow cases, dust control in home. He does have poor dental care-encourage dentist and tooth care.  He says he does not miss any of his meds. On Dulera Twice daily  .    Review of Systems  Constitutional:   No weight loss, night sweats,  Fevers, chills, fatigue, lassitude. HEENT:   No headaches,  Difficulty swallowing,  Tooth/dental problems,  Sore throat,  +sinus pressure , congestion sinus pain   CV:  No chest pain, orthopnea, PND, swelling in lower extremities, anasarca, dizziness, palpitations  GI  No heartburn, indigestion, abdominal pain, nausea, vomiting, diarrhea, change in bowel habits, loss of appetite  Resp:     No coughing up of blood.     Skin: no rash or lesions.  GU: no dysuria, change in color of urine, no urgency or frequency.  No flank pain.  MS:   No joint pain or swelling.  No decreased range of motion.  No back pain.  Psych:  No change in mood or affect. No depression or anxiety.  No memory loss.      Objective:   Physical Exam  General- Alert, Oriented, Affect-appropriate, Distress- none acute  Tall, trim  Skin- rash-none, lesions- none, excoriation- none  Lymphadenopathy- none  Head- atraumatic, poor dentition.   Eyes- Gross vision intact, PERRLA, conjunctivae clear secretions  Ears- Hearing, canals, Tm - normal  Nose- Clear, No- Septal dev, mucus, polyps, erosion, perforation   Throat- Mallampati II , mucosa clear , drainage- none, tonsils- atrophic  Neck- flexible , trachea midline, no stridor , thyroid nl, carotid no bruit  Chest - symmetrical excursion , unlabored     Heart/CV- RRR , no murmur , no gallop  , no rub, nl s1 s2                     - JVD- none , edema- none, stasis changes- none, varices- none     Lung- coarse BS w/ exp wheezing, speaking in full sentences, no accessory muscle use      Chest wall-   Abd- tender-no, distended-no, bowel sounds-present, HSM- no  Br/ Gen/ Rectal- Not done, not indicated  Extrem- cyanosis- none, clubbing, none, atrophy- none, strength- nl  Neuro- grossly intact to observation         Assessment & Plan:

## 2010-11-27 NOTE — Patient Instructions (Signed)
Finish Septra DS  Begin Prednisone taper.  Mucinex DM Twice daily  As needed  Cough/congestion  Begin Zyrtec 10mg  At bedtime  When finished with antibiotics Saline nasal rinses As needed   Better dental care and follow up with dentist on regular basis.  follow up Dr. Shelle Iron in 3 weeks and As needed  . Please contact office for sooner follow up if symptoms do not improve or worsen or seek emergency care

## 2010-11-27 NOTE — Progress Notes (Signed)
Note reviewed in detail. Agree with plan as outlined.

## 2010-11-27 NOTE — Assessment & Plan Note (Signed)
Flare with associated sinusitis  Plan:  Finis Bud DS  Begin Prednisone taper.  Mucinex DM Twice daily  As needed  Cough/congestion  Begin Zyrtec 10mg  At bedtime  When finished with antibiotics Saline nasal rinses As needed   Better dental care and follow up with dentist on regular basis.  follow up Dr. Shelle Iron in 3 weeks and As needed  . Please contact office for sooner follow up if symptoms do not improve or worsen or seek emergency care

## 2010-12-01 ENCOUNTER — Ambulatory Visit (INDEPENDENT_AMBULATORY_CARE_PROVIDER_SITE_OTHER): Payer: 59

## 2010-12-01 DIAGNOSIS — J309 Allergic rhinitis, unspecified: Secondary | ICD-10-CM

## 2010-12-02 ENCOUNTER — Other Ambulatory Visit: Payer: Self-pay

## 2010-12-02 ENCOUNTER — Emergency Department (HOSPITAL_COMMUNITY)
Admission: EM | Admit: 2010-12-02 | Discharge: 2010-12-02 | Disposition: A | Discharge status: Home / Self Care | Payer: 59 | Attending: Emergency Medicine | Admitting: Emergency Medicine

## 2010-12-02 ENCOUNTER — Emergency Department (HOSPITAL_COMMUNITY): Payer: 59

## 2010-12-02 ENCOUNTER — Encounter (HOSPITAL_COMMUNITY): Payer: Self-pay | Admitting: Emergency Medicine

## 2010-12-02 DIAGNOSIS — IMO0001 Reserved for inherently not codable concepts without codable children: Secondary | ICD-10-CM

## 2010-12-02 DIAGNOSIS — J45909 Unspecified asthma, uncomplicated: Secondary | ICD-10-CM | POA: Insufficient documentation

## 2010-12-02 DIAGNOSIS — Z87891 Personal history of nicotine dependence: Secondary | ICD-10-CM | POA: Insufficient documentation

## 2010-12-02 DIAGNOSIS — R0602 Shortness of breath: Secondary | ICD-10-CM | POA: Insufficient documentation

## 2010-12-02 LAB — BLOOD GAS, ARTERIAL
Bicarbonate: 23.7 mEq/L (ref 20.0–24.0)
pH, Arterial: 7.366 (ref 7.350–7.450)
pO2, Arterial: 67.7 mmHg — ABNORMAL LOW (ref 80.0–100.0)

## 2010-12-02 LAB — CBC
HCT: 42.8 % (ref 39.0–52.0)
Hemoglobin: 15 g/dL (ref 13.0–17.0)
MCH: 30.7 pg (ref 26.0–34.0)
RBC: 4.88 MIL/uL (ref 4.22–5.81)

## 2010-12-02 LAB — BASIC METABOLIC PANEL
CO2: 21 mEq/L (ref 19–32)
Calcium: 9.6 mg/dL (ref 8.4–10.5)
Glucose, Bld: 95 mg/dL (ref 70–99)
Potassium: 3.5 mEq/L (ref 3.5–5.1)
Sodium: 137 mEq/L (ref 135–145)

## 2010-12-02 MED ORDER — METHYLPREDNISOLONE SODIUM SUCC 125 MG IJ SOLR
125.0000 mg | Freq: Once | INTRAMUSCULAR | Status: AC
  Administered 2010-12-02: 125 mg via INTRAVENOUS
  Filled 2010-12-02 (×2): qty 2

## 2010-12-02 MED ORDER — ONDANSETRON HCL 4 MG/2ML IJ SOLN
4.0000 mg | Freq: Once | INTRAMUSCULAR | Status: AC
  Administered 2010-12-02: 4 mg via INTRAVENOUS
  Filled 2010-12-02: qty 2

## 2010-12-02 MED ORDER — IPRATROPIUM BROMIDE 0.02 % IN SOLN
0.5000 mg | Freq: Once | RESPIRATORY_TRACT | Status: AC
  Administered 2010-12-02: 0.5 mg via RESPIRATORY_TRACT
  Filled 2010-12-02: qty 2.5

## 2010-12-02 MED ORDER — PREDNISONE 10 MG PO TABS: 60.0000 mg | ORAL_TABLET | Freq: Every day | ORAL | Status: AC

## 2010-12-02 MED ORDER — ALBUTEROL SULFATE HFA 108 (90 BASE) MCG/ACT IN AERS: 2.0000 | INHALATION_SPRAY | RESPIRATORY_TRACT | Status: DC | PRN

## 2010-12-02 MED ORDER — MORPHINE SULFATE 4 MG/ML IJ SOLN
4.0000 mg | Freq: Once | INTRAMUSCULAR | Status: AC
  Administered 2010-12-02: 4 mg via INTRAVENOUS
  Filled 2010-12-02: qty 1

## 2010-12-02 MED ORDER — MAGNESIUM SULFATE 40 MG/ML IJ SOLN
2.0000 g | Freq: Once | INTRAMUSCULAR | Status: AC
  Administered 2010-12-02: 2 g via INTRAVENOUS
  Filled 2010-12-02 (×2): qty 50

## 2010-12-02 MED ORDER — ALBUTEROL SULFATE (5 MG/ML) 0.5% IN NEBU
5.0000 mg | INHALATION_SOLUTION | Freq: Once | RESPIRATORY_TRACT | Status: AC
  Administered 2010-12-02: 5 mg via RESPIRATORY_TRACT
  Filled 2010-12-02: qty 1

## 2010-12-02 MED ORDER — ALBUTEROL SULFATE (5 MG/ML) 0.5% IN NEBU
10.0000 mg | INHALATION_SOLUTION | Freq: Once | RESPIRATORY_TRACT | Status: AC
  Administered 2010-12-02: 10 mg via RESPIRATORY_TRACT
  Filled 2010-12-02: qty 2

## 2010-12-02 NOTE — ED Provider Notes (Signed)
History   Written by Enos Fling acting as scribe for Drew Co, MD.   Chief Complaint  Patient presents with  . Cough   HPI Comments: Pt c/o cough x few weeks which is making his asthma worse. Uses an albuterol inhaler at home, but it is providing no relief. Pt was admitted in June at [facility] x 6 days, placed on ventilator at that time for a few days. Breathing had improved but is worse again since cough. Pt currently on prednisone treatment, taking 3 pills a day currently.  Patient is a 35 y.o. male presenting with cough. The history is provided by the patient.  Cough This is a recurrent problem. The current episode started more than 1 week ago. The problem occurs constantly. The problem has been gradually worsening. The cough is non-productive. There has been no fever. Associated symptoms include shortness of breath and wheezing. Pertinent negatives include no chest pain, no headaches, no rhinorrhea, no sore throat and no eye redness. Treatments tried: albuterol inhaler. The treatment provided no relief. His past medical history is significant for asthma. Past medical history comments: seasonal allergies.    Past Medical History  Diagnosis Date  . Asthma   . Seasonal allergies     History reviewed. No pertinent past surgical history.  Family History  Problem Relation Age of Onset  . Emphysema Father     History  Substance Use Topics  . Smoking status: Former Smoker -- 0.5 packs/day for 11.5 years    Quit date: 04/09/2010  . Smokeless tobacco: Never Used   Comment: started at age 37.  1/2 ppd.    . Alcohol Use: No      Review of Systems  HENT: Negative for sore throat and rhinorrhea.   Eyes: Negative for redness.  Respiratory: Positive for cough, shortness of breath and wheezing.   Cardiovascular: Negative for chest pain.  Neurological: Negative for headaches.  All other systems reviewed and are negative.    Physical Exam  BP 132/79  Pulse 97   Temp(Src) 98.4 F (36.9 C) (Oral)  Resp 19  SpO2 92%  Physical Exam  Nursing note and vitals reviewed. Constitutional: He is oriented to person, place, and time. He appears well-developed and well-nourished. No distress.  HENT:  Head: Normocephalic.  Mouth/Throat: Mucous membranes are normal.  Eyes:       Normal appearance  Neck: Normal range of motion. Neck supple.  Cardiovascular: Normal rate and regular rhythm.  Exam reveals no gallop and no friction rub.   No murmur heard. Pulmonary/Chest: Effort normal. He has wheezes. He has no rhonchi. He has no rales.       Bilateral wheezing with good air movement down to bases.  Abdominal: Soft. There is no tenderness.  Musculoskeletal: Normal range of motion.       Normal appearance  Neurological: He is alert and oriented to person, place, and time.       Motor intact in all extremities  Skin: Skin is warm and dry. No rash noted.       Color normal  Psychiatric: He has a normal mood and affect.    ED Course  CRITICAL CARE Performed by: Drew Sandoval Authorized by: Drew Sandoval Total critical care time: 30 minutes Critical care was necessary to treat or prevent imminent or life-threatening deterioration of the following conditions: respiratory failure. Critical care was time spent personally by me on the following activities: evaluation of patient's response to treatment, ordering and  performing treatments and interventions, examination of patient, ordering and review of laboratory studies, obtaining history from patient or surrogate, ordering and review of radiographic studies, pulse oximetry, review of old charts and re-evaluation of patient's condition.    10:31 AM Pt recheck: pt feeling a little better on breathing treatment but reports increased coughing.  Results for orders placed during the hospital encounter of 12/02/10  CBC      Component Value Range   WBC 17.9 (*) 4.0 - 10.5 (K/uL)   RBC 4.88  4.22 - 5.81 (MIL/uL)     Hemoglobin 15.0  13.0 - 17.0 (g/dL)   HCT 95.6  21.3 - 08.6 (%)   MCV 87.7  78.0 - 100.0 (fL)   MCH 30.7  26.0 - 34.0 (pg)   MCHC 35.0  30.0 - 36.0 (g/dL)   RDW 57.8  46.9 - 62.9 (%)   Platelets 228  150 - 400 (K/uL)  BASIC METABOLIC PANEL      Component Value Range   Sodium 137  135 - 145 (mEq/L)   Potassium 3.5  3.5 - 5.1 (mEq/L)   Chloride 102  96 - 112 (mEq/L)   CO2 21  19 - 32 (mEq/L)   Glucose, Bld 95  70 - 99 (mg/dL)   BUN 18  6 - 23 (mg/dL)   Creatinine, Ser 5.28  0.50 - 1.35 (mg/dL)   Calcium 9.6  8.4 - 41.3 (mg/dL)   GFR calc non Af Amer >60  >60 (mL/min)   GFR calc Af Amer >60  >60 (mL/min)  BLOOD GAS, ARTERIAL      Component Value Range   O2 Content, Ven 2.0     Delivery systems NASAL CANNULA     pH, Arterial 7.366  7.350 - 7.450    pCO2 42.3  35.0 - 45.0 (mmHg)   pO2, Arterial 67.7 (*) 80.0 - 100.0 (mmHg)   Bicarbonate 23.7  20.0 - 24.0 (mEq/L)   TCO2 20.8  0 - 100 (mmol/L)   Acid-base deficit 0.9  0.0 - 2.0 (mmol/L)   O2 Saturation 91.9     Patient temperature 37.0     Collection site LEFT RADIAL     Drawn by [id number]     Sample type ARTERIAL     Allens test (pass/fail) PASS  PASS    Dg Chest 2 View  12/02/2010  *RADIOLOGY REPORT*  Clinical Data: Shortness of breath, cough x2 weeks, asthma  CHEST - 2 VIEW  Comparison: 11/17/2010  Findings: Unchanged cardiac silhouette and mediastinal contours. The lungs remain mildly hyperinflated.  No pneumothorax.  No focal airspace opacity.  No pleural effusion.  Unchanged bones.  IMPRESSION: No acute cardiopulmonary disease.  Original Report Authenticated By: Waynard Reeds, M.D.     MDM  Patient has evidence of an acute asthma exacerbation at this time.  No hypoxia currently will treat with hour-long albuterol nebulized treatment.  IV magnesium.  Awaiting chest x-ray and laboratory data as well as repeat evaluation.  On low dose prednisone at this time will provide the patient with an IV dose of Solu-Medrol.    12:42  PM The patient's symptoms at this time are improved however his breathing has not returned to baseline.  I think the best course of action for this patient given his significant history of asthma including recent intubation for status asthmaticus the admission for observation and ongoing therapy.  This option was presented to the patient and his spouse however the patient was adamant that he  would not be admitted.  I let him know I was concerned about significant worsening of his symptoms he returned home.  However at this time the patient still does not want to be admitted.  The patient has a clear sensorium, is not hypercarbic and has the ability to make that decision.  We'll plan to discharge home with a steroid burst and schedule albuterol nebulized treatments while at home.  Instructed to call the pulmonology office later today for followup either later today or tomorrow.  Patient understands to return to the ER for any new or worsening symptoms.  He understands that I disagree with this rationale and a significant concern if he were to return home.  Despite this he still wishes to return home.  The patient's spouse was in the room during this entire conversation.   I personally performed the services described in this documentation, which was scribed in my presence. The recorded information has been reviewed and considered. Drew Co, MD  Personally reviewed the patient's x-ray as well as laboratory data.  Old records reviewed including discharge summary    Drew Co, MD 12/02/10 1247

## 2010-12-02 NOTE — ED Notes (Signed)
Pt  C/o dry cough x 2 weeks and "triggering my asthma". nad at this time. No sob observed. C/o intermittant wheezing.

## 2010-12-04 ENCOUNTER — Ambulatory Visit (INDEPENDENT_AMBULATORY_CARE_PROVIDER_SITE_OTHER): Payer: 59

## 2010-12-04 DIAGNOSIS — J309 Allergic rhinitis, unspecified: Secondary | ICD-10-CM

## 2010-12-08 ENCOUNTER — Ambulatory Visit (INDEPENDENT_AMBULATORY_CARE_PROVIDER_SITE_OTHER): Payer: 59

## 2010-12-08 DIAGNOSIS — J309 Allergic rhinitis, unspecified: Secondary | ICD-10-CM

## 2010-12-09 ENCOUNTER — Ambulatory Visit (INDEPENDENT_AMBULATORY_CARE_PROVIDER_SITE_OTHER): Payer: 59

## 2010-12-09 DIAGNOSIS — J309 Allergic rhinitis, unspecified: Secondary | ICD-10-CM

## 2010-12-11 ENCOUNTER — Ambulatory Visit (INDEPENDENT_AMBULATORY_CARE_PROVIDER_SITE_OTHER): Payer: 59

## 2010-12-11 DIAGNOSIS — J309 Allergic rhinitis, unspecified: Secondary | ICD-10-CM

## 2010-12-15 ENCOUNTER — Telehealth: Payer: Self-pay | Admitting: Pulmonary Disease

## 2010-12-15 ENCOUNTER — Emergency Department (HOSPITAL_COMMUNITY)
Admission: EM | Admit: 2010-12-15 | Discharge: 2010-12-16 | Disposition: A | Discharge status: Home / Self Care | Payer: 59 | Attending: Emergency Medicine | Admitting: Emergency Medicine

## 2010-12-15 ENCOUNTER — Encounter (HOSPITAL_COMMUNITY): Payer: Self-pay

## 2010-12-15 ENCOUNTER — Emergency Department (HOSPITAL_COMMUNITY): Payer: 59

## 2010-12-15 DIAGNOSIS — J45901 Unspecified asthma with (acute) exacerbation: Secondary | ICD-10-CM

## 2010-12-15 DIAGNOSIS — J45909 Unspecified asthma, uncomplicated: Secondary | ICD-10-CM | POA: Insufficient documentation

## 2010-12-15 DIAGNOSIS — Z87891 Personal history of nicotine dependence: Secondary | ICD-10-CM | POA: Insufficient documentation

## 2010-12-15 LAB — BASIC METABOLIC PANEL
CO2: 21 mEq/L (ref 19–32)
GFR calc non Af Amer: >60 mL/min (ref >60)
Glucose, Bld: 88 mg/dL (ref 70–99)
Potassium: 4.3 mEq/L (ref 3.5–5.1)
Sodium: 140 mEq/L (ref 135–145)

## 2010-12-15 LAB — CBC
Hemoglobin: 14.1 g/dL (ref 13.0–17.0)
RBC: 4.77 MIL/uL (ref 4.22–5.81)

## 2010-12-15 MED ORDER — ALBUTEROL SULFATE (5 MG/ML) 0.5% IN NEBU
2.5000 mg | INHALATION_SOLUTION | Freq: Once | RESPIRATORY_TRACT | Status: AC
  Administered 2010-12-15: 2.5 mg via RESPIRATORY_TRACT
  Filled 2010-12-15: qty 0.5

## 2010-12-15 MED ORDER — IPRATROPIUM BROMIDE 0.02 % IN SOLN
RESPIRATORY_TRACT | Status: AC
  Administered 2010-12-15: 0.2 mg via RESPIRATORY_TRACT
  Filled 2010-12-15: qty 2.5

## 2010-12-15 MED ORDER — ALBUTEROL (5 MG/ML) CONTINUOUS INHALATION SOLN
10.0000 mg/h | INHALATION_SOLUTION | RESPIRATORY_TRACT | Status: AC
  Administered 2010-12-15: 10 mg/h via RESPIRATORY_TRACT

## 2010-12-15 MED ORDER — ALBUTEROL SULFATE (5 MG/ML) 0.5% IN NEBU
INHALATION_SOLUTION | RESPIRATORY_TRACT | Status: AC
  Administered 2010-12-15: 5 mg via RESPIRATORY_TRACT
  Filled 2010-12-15: qty 1.5

## 2010-12-15 MED ORDER — METHYLPREDNISOLONE SODIUM SUCC 125 MG IJ SOLR
125.0000 mg | Freq: Once | INTRAMUSCULAR | Status: AC
  Administered 2010-12-15: 22:00:00 via INTRAVENOUS

## 2010-12-15 MED ORDER — IPRATROPIUM BROMIDE 0.02 % IN SOLN
0.5000 mg | Freq: Once | RESPIRATORY_TRACT | Status: AC
  Administered 2010-12-15: 0.2 mg via RESPIRATORY_TRACT

## 2010-12-15 MED ORDER — LEVOFLOXACIN 750 MG PO TABS: 750.0000 mg | ORAL_TABLET | Freq: Every day | ORAL | Status: AC

## 2010-12-15 MED ORDER — METHYLPREDNISOLONE SODIUM SUCC 125 MG IJ SOLR
INTRAMUSCULAR | Status: AC
  Filled 2010-12-15: qty 2

## 2010-12-15 NOTE — Telephone Encounter (Signed)
Sounds like he has a sinusitis Call in levaquin 750mg  one a day for 10 days neilmed sinus rinses am and pm  (tell him what to get and spell for him) Can use afrin nasal spray 2 each nostril am and pm for 4 days ONLY.  Do not use after this. Keep apptm with me

## 2010-12-15 NOTE — ED Provider Notes (Signed)
History     CSN: [id number] Arrival date & time: 12/15/2010  9:53 PM  Chief Complaint  Patient presents with  . Asthma   HPI Comments: Patient has a history of severe asthma. He presents to the emergency department with gradual onset of shortness of breath which occurred last night. He has become gradually worse and is associated with a productive cough. It has improved only slightly with albuterol inhaler at home. He currently uses Singulair, dulera , albuterol with marginal relief. In June of this year he was intubated on a ventilator for severe asthma. Currently his symptoms are severe, oxygen levels approximately 93%.  Patient is a 35 y.o. male presenting with asthma. The history is provided by the patient, medical records and a significant other.  Asthma    Past Medical History  Diagnosis Date  . Asthma   . Seasonal allergies     History reviewed. No pertinent past surgical history.  Family History  Problem Relation Age of Onset  . Emphysema Father     History  Substance Use Topics  . Smoking status: Former Smoker -- 0.5 packs/day for 11.5 years    Quit date: 04/09/2010  . Smokeless tobacco: Never Used   Comment: started at age 38.  1/2 ppd.    . Alcohol Use: No      Review of Systems  All other systems reviewed and are negative.    Physical Exam  BP 128/85  Pulse 111  Temp(Src) 98.2 F (36.8 C) (Oral)  Resp 26  Ht 6\' 3"  (1.905 m)  Wt 172 lb (78.019 kg)  BMI 21.50 kg/m2  SpO2 93%  Physical Exam  Nursing note and vitals reviewed. Constitutional: He appears well-developed and well-nourished. He appears distressed (Mild).  HENT:  Head: Normocephalic and atraumatic.  Mouth/Throat: Oropharynx is clear and moist. No oropharyngeal exudate.  Eyes: Conjunctivae and EOM are normal. Pupils are equal, round, and reactive to light. Right eye exhibits no discharge. Left eye exhibits no discharge. No scleral icterus.  Neck: Normal range of motion. Neck supple. No  JVD present. No thyromegaly present.  Cardiovascular: Regular rhythm, normal heart sounds and intact distal pulses.  Exam reveals no gallop and no friction rub.   No murmur heard.      Tachycardia  Pulmonary/Chest: He is in respiratory distress. He has wheezes. He has no rales.       Increased respiratory rate, increased work of breathing. Wheezing in all lung fields. Speech in short sentences due to difficulty breathing.  Abdominal: Soft. Bowel sounds are normal. He exhibits no distension and no mass. There is no tenderness.  Musculoskeletal: Normal range of motion. He exhibits no edema and no tenderness.  Lymphadenopathy:    He has no cervical adenopathy.  Neurological: He is alert. Coordination normal.  Skin: Skin is warm and dry. No rash noted. No erythema.  Psychiatric: He has a normal mood and affect. His behavior is normal.    ED Course  Procedures  MDM Patient appears in mild respiratory distress with a mild tachycardia and hypoxia of 93% on room air. Will initiate a continuous nebulizer treatment of albuterol, Solu-Medrol, chest x-ray and labs.  Patient has improved significantly with breathing treatments, current oxygen status is 97% on room air. Repeat exam shows decreased wheezing and increased air movement throughout lung fields. Chest x-ray negative for acute infiltrate. Will discharge home at patient's request. I did offer admission for ongoing wheezing and he declined at this time.  Vida Roller, MD  12/16/10 0139 

## 2010-12-15 NOTE — Telephone Encounter (Signed)
Called spoke with patient who c/o prod cough with green mucus, increased SOB, sinus pressure/congestion, PND, sore throat, sweats x1week and lightheadedness with coughing spells onset last PM.  Pt also states that he has had a "contant HA" x2weeks that advil cold & sinus does not help with.  Pt has upcoming appt with [facility] 8.14.12.  Dr Shelle Iron please advise, thanks!  Allergies  Allergen Reactions  . Penicillins     REACTION: rash

## 2010-12-15 NOTE — ED Notes (Signed)
Sob, wheezing onset last night, worse this am,  Using albuterol inhaler without relief

## 2010-12-15 NOTE — Telephone Encounter (Signed)
CALLED BACK AGAIN. WANTS TO HAVE RECS ASAP TODAY. Drew Sandoval

## 2010-12-15 NOTE — Telephone Encounter (Signed)
lmomtcb x1 rx for levaquin already sent

## 2010-12-16 MED ORDER — ALBUTEROL SULFATE HFA 108 (90 BASE) MCG/ACT IN AERS: 1.0000 | INHALATION_SPRAY | Freq: Four times a day (QID) | RESPIRATORY_TRACT | Status: DC | PRN

## 2010-12-16 MED ORDER — ACETAMINOPHEN 500 MG PO TABS
1000.0000 mg | ORAL_TABLET | Freq: Once | ORAL | Status: AC
  Administered 2010-12-16: 1000 mg via ORAL
  Filled 2010-12-16: qty 2

## 2010-12-16 MED ORDER — PREDNISONE 20 MG PO TABS: 20.0000 mg | ORAL_TABLET | Freq: Every day | ORAL | Status: DC

## 2010-12-16 MED ORDER — AZITHROMYCIN 250 MG PO TABS: 250.0000 mg | ORAL_TABLET | Freq: Every day | ORAL | Status: AC

## 2010-12-16 MED ORDER — BENZONATATE 100 MG PO CAPS: 200.0000 mg | ORAL_CAPSULE | Freq: Two times a day (BID) | ORAL | Status: AC | PRN

## 2010-12-16 NOTE — ED Notes (Signed)
Pt self ambulated out with a steady gait stating no needs 

## 2010-12-16 NOTE — Telephone Encounter (Signed)
Pt advised of recs.Jennifer Castillo, CMA  

## 2010-12-16 NOTE — ED Notes (Signed)
Pt resting in bed in room family at bedside no noted distress no stated needs 

## 2010-12-18 ENCOUNTER — Ambulatory Visit (INDEPENDENT_AMBULATORY_CARE_PROVIDER_SITE_OTHER): Payer: 59

## 2010-12-18 DIAGNOSIS — J309 Allergic rhinitis, unspecified: Secondary | ICD-10-CM

## 2010-12-22 ENCOUNTER — Ambulatory Visit (INDEPENDENT_AMBULATORY_CARE_PROVIDER_SITE_OTHER): Payer: 59

## 2010-12-22 ENCOUNTER — Encounter: Payer: Self-pay | Admitting: Pulmonary Disease

## 2010-12-22 ENCOUNTER — Ambulatory Visit (INDEPENDENT_AMBULATORY_CARE_PROVIDER_SITE_OTHER): Payer: 59 | Admitting: Pulmonary Disease

## 2010-12-22 VITALS — BP 132/80 | HR 70 | Temp 97.9°F | Ht 75.0 in | Wt 183.4 lb

## 2010-12-22 DIAGNOSIS — J309 Allergic rhinitis, unspecified: Secondary | ICD-10-CM

## 2010-12-22 DIAGNOSIS — J45909 Unspecified asthma, uncomplicated: Secondary | ICD-10-CM

## 2010-12-22 MED ORDER — ALBUTEROL SULFATE (2.5 MG/3ML) 0.083% IN NEBU: 2.5000 mg | INHALATION_SOLUTION | Freq: Four times a day (QID) | RESPIRATORY_TRACT | Status: DC | PRN

## 2010-12-22 NOTE — Assessment & Plan Note (Signed)
The patient is back to his usual baseline after an acute exacerbation last month.  His recurrent flares have been due to medical compliance issues at times, significant allergic disease, and also sinusitis.  He feels the allergy vaccine is helping him, especially when he is getting higher concentrations.  Hopefully he will stabilize once on maintenance.  He continues to have issues, might consider trying Xolair even though his IgE is outside the treatment range.

## 2010-12-22 NOTE — Patient Instructions (Signed)
No change in current meds. Continue with allergy vaccine Please call early if you feel you are having worsening symptoms. followup with me in 3mos if you are doing well.

## 2010-12-22 NOTE — Progress Notes (Signed)
  Subjective:    Patient ID: Drew Sandoval, male    DOB: 28-Nov-1975, 35 y.o.   MRN: [id number]  HPI The patient comes in today for followup of his known severe asthma with significant allergy component.  He had a recent exacerbation last month, and has been treated with a course of antibiotics and prednisone that he is currently finishing.  He feels his breathing is much improved, and is returned to baseline.  He denies any significant cough, congestion, or increased rescue inhaler use.  He has noted that his asthma has been better as he has gotten to the higher allergy vaccine concentrations.  I would hope things will stabilize once he gets to the maintenance dose.   Review of Systems  Constitutional: Negative for fever and unexpected weight change.  HENT: Negative for ear pain, nosebleeds, congestion, sore throat, rhinorrhea, sneezing, trouble swallowing, dental problem, postnasal drip and sinus pressure.   Eyes: Negative for redness and itching.  Respiratory: Negative for cough, chest tightness, shortness of breath and wheezing.   Cardiovascular: Negative for palpitations and leg swelling.  Gastrointestinal: Negative for nausea and vomiting.  Genitourinary: Negative for dysuria.  Musculoskeletal: Negative for joint swelling.  Skin: Negative for rash.  Neurological: Negative for headaches.  Hematological: Does not bruise/bleed easily.  Psychiatric/Behavioral: Negative for dysphoric mood. The patient is not nervous/anxious.        Objective:   Physical Exam Well-developed male in no acute distress Nose without discharge or purulence noted Chest fairly clear to auscultation Cardiac with regular rate and rhythm Lower extremities without edema, no cyanosis noted Alert and oriented, moves all 4       Assessment & Plan:

## 2010-12-25 ENCOUNTER — Ambulatory Visit (INDEPENDENT_AMBULATORY_CARE_PROVIDER_SITE_OTHER): Payer: 59

## 2010-12-25 DIAGNOSIS — J309 Allergic rhinitis, unspecified: Secondary | ICD-10-CM

## 2010-12-29 ENCOUNTER — Encounter (HOSPITAL_COMMUNITY): Payer: Self-pay

## 2010-12-29 ENCOUNTER — Emergency Department (HOSPITAL_COMMUNITY)
Admission: EM | Admit: 2010-12-29 | Discharge: 2010-12-29 | Disposition: A | Discharge status: Home / Self Care | Payer: 59 | Attending: Emergency Medicine | Admitting: Emergency Medicine

## 2010-12-29 DIAGNOSIS — J069 Acute upper respiratory infection, unspecified: Secondary | ICD-10-CM | POA: Insufficient documentation

## 2010-12-29 DIAGNOSIS — J45909 Unspecified asthma, uncomplicated: Secondary | ICD-10-CM | POA: Insufficient documentation

## 2010-12-29 DIAGNOSIS — Z87891 Personal history of nicotine dependence: Secondary | ICD-10-CM | POA: Insufficient documentation

## 2010-12-29 MED ORDER — GUAIFENESIN-CODEINE 100-10 MG/5ML PO SYRP: 5.0000 mL | ORAL_SOLUTION | Freq: Three times a day (TID) | ORAL | Status: AC | PRN

## 2010-12-29 MED ORDER — AZITHROMYCIN 250 MG PO TABS: ORAL_TABLET | ORAL | Status: DC

## 2010-12-29 NOTE — ED Provider Notes (Signed)
History     CSN: [id number] Arrival date & time: 12/29/2010  9:09 AM  Chief Complaint  Patient presents with  . Nasal Congestion   Patient is a 35 y.o. male presenting with URI. The history is provided by the patient.  URI The primary symptoms include cough. Primary symptoms do not include fever, fatigue, headaches, ear pain, sore throat, swollen glands, abdominal pain, nausea, vomiting, myalgias or rash. The current episode started 3 to 5 days ago. This is a new problem. The problem has not changed since onset. The cough began 3 to 5 days ago. The cough is recurrent. The cough is non-productive. There is nondescript sputum produced.  Symptoms associated with the illness include sinus pressure, congestion and rhinorrhea. The illness is not associated with chills or facial pain.    Past Medical History  Diagnosis Date  . Asthma   . Seasonal allergies     History reviewed. No pertinent past surgical history.  Family History  Problem Relation Age of Onset  . Emphysema Father     History  Substance Use Topics  . Smoking status: Former Smoker -- 0.5 packs/day for 11.5 years    Quit date: 04/09/2010  . Smokeless tobacco: Never Used   Comment: started at age 80.  1/2 ppd.    . Alcohol Use: No      Review of Systems  Constitutional: Negative for fever, chills and fatigue.  HENT: Positive for congestion, rhinorrhea, sneezing and sinus pressure. Negative for ear pain, sore throat, facial swelling, trouble swallowing, neck pain, neck stiffness and dental problem.   Respiratory: Positive for cough.   Gastrointestinal: Negative for nausea, vomiting and abdominal pain.  Musculoskeletal: Negative for myalgias.  Skin: Negative for rash.  Neurological: Negative for dizziness and headaches.  All other systems reviewed and are negative.    Physical Exam  BP 150/85  Pulse 85  Temp 98.6 F (37 C)  Resp 18  Ht 6\' 3"  (1.905 m)  Wt 184 lb (83.462 kg)  BMI 23.00 kg/m2  SpO2  94%  Physical Exam  Nursing note and vitals reviewed. Constitutional: He is oriented to person, place, and time. He appears well-developed and well-nourished.  Non-toxic appearance. He does not have a sickly appearance. He does not appear ill.  HENT:  Head: Normocephalic and atraumatic.  Right Ear: External ear normal.  Left Ear: External ear normal.  Nose: Mucosal edema and rhinorrhea present.  Mouth/Throat: Uvula is midline, oropharynx is clear and moist and mucous membranes are normal. No oropharyngeal exudate.  Eyes: EOM are normal. Pupils are equal, round, and reactive to light.  Neck: Normal range of motion. Neck supple. No thyromegaly present.  Cardiovascular: Normal rate, regular rhythm and normal heart sounds.   Abdominal: Soft. There is no tenderness.  Musculoskeletal: He exhibits no edema and no tenderness.  Lymphadenopathy:    He has no cervical adenopathy.  Neurological: He is alert and oriented to person, place, and time. He exhibits normal muscle tone. Coordination normal.  Skin: Skin is warm and dry.  Psychiatric: He has a normal mood and affect.    ED Course  Procedures  MDM   1040  Pt previous ED charts and nursing notes were reviewed by me,  Pt is non-toxic appearing.  Vitals stable, no tachycardia, tachypnea or hypoxia.  Sx's today are likely related to URI, will prescribe antihistamine and cough medication  Patient / Family / Caregiver understand and agree with initial ED impression and plan with expectations set for ED  visit.   The patient appears reasonably screened and/or stabilized for discharge and I doubt any other medical condition or other [facility] requiring further screening, evaluation, or treatment in the ED at this time prior to discharge.       Gionna Polak L. Odie Rauen, [place] 01/06/11 1250

## 2010-12-29 NOTE — ED Notes (Signed)
Cough and congestion since friday

## 2011-01-01 ENCOUNTER — Ambulatory Visit (INDEPENDENT_AMBULATORY_CARE_PROVIDER_SITE_OTHER): Payer: 59

## 2011-01-01 DIAGNOSIS — J309 Allergic rhinitis, unspecified: Secondary | ICD-10-CM

## 2011-01-04 ENCOUNTER — Ambulatory Visit (INDEPENDENT_AMBULATORY_CARE_PROVIDER_SITE_OTHER): Payer: 59 | Admitting: Internal Medicine

## 2011-01-04 ENCOUNTER — Other Ambulatory Visit: Payer: 59

## 2011-01-04 ENCOUNTER — Encounter: Payer: Self-pay | Admitting: Internal Medicine

## 2011-01-04 VITALS — BP 140/88 | HR 77 | Ht 75.0 in | Wt 170.2 lb

## 2011-01-04 DIAGNOSIS — J45909 Unspecified asthma, uncomplicated: Secondary | ICD-10-CM

## 2011-01-04 DIAGNOSIS — D721 Eosinophilia, unspecified: Secondary | ICD-10-CM

## 2011-01-04 MED ORDER — PREDNISONE 20 MG PO TABS: 20.0000 mg | ORAL_TABLET | Freq: Every day | ORAL | Status: DC

## 2011-01-04 MED ORDER — METHYLPREDNISOLONE ACETATE 80 MG/ML IJ SUSP
80.0000 mg | Freq: Once | INTRAMUSCULAR | Status: AC
  Administered 2011-01-04: 80 mg via INTRAMUSCULAR

## 2011-01-04 NOTE — Assessment & Plan Note (Addendum)
Another exacerbation- unclear trigger, but note time of year.  I will put him on maintenance prednisone, trying to buy time as allergy vaccine builds. We will recheck IgE level in return Consider if Daliresp might have any role.  Will check allergy for chicken  If IgE gets down to 700, he may be candidate for Xolair.

## 2011-01-04 NOTE — Patient Instructions (Signed)
Depo 80  Prednisone script sent- to take 20 mg once daily till seen by Dr Maple Hudson again  Order-lab- chicken allergy [phone]                   Chicken feather  [id number]                   Chicken IgG [id number]                   Chicken IgG4 [id number]                    IgE

## 2011-01-04 NOTE — Progress Notes (Signed)
Subjective:    Patient ID: Drew Sandoval, male    DOB: 07/07/1975, 35 y.o.   MRN: [id number]  HPI  Subjective:    Patient ID: Drew Sandoval, male    DOB: 1975-08-08, 35 y.o.   MRN: [id number]  HPI 11/04/10- 67 yo former smoker followed for severe asthma, chronic sinusitis, hype-reosinophilia and hyper IgE Sent to me by Dr Shelle Iron for allergy evaluation. 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 Allergy profile- 08/11/10 IgE 1,747 with broad specific elevation Last here- August 31, 2010.- note reviewed. Since last here has been in hospital again, getting out 2 weeks ago. Feels well today.  Comes today as planned for allergy skin testing Skin test: Strongly positive especially grass, tree, dust mite  01/04/11- 35 yo former smoker followed for severe asthma, chronic sinusitis, hyper-eosinophilia and hyper IgE.  wife is with him Sent to me by Dr Shelle Iron for allergy evaluation. 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 Allergy vaccine was started 11/09/2010 and is building without problems He was still on prednisone when he saw Dr Shelle Iron recently and at that time he seemed improved after a recent exacerbation- 2 weeks ago. About 2 weeks after their visit he began coughing again. He hasn't noted any unusual exposure at work, or any sense that he had a cold.  His last IgE was 1747.  He doesn't feel "sick"- just tight and wheezy.  Review of Systems Constitutional:   No weight loss, night sweats,  Fevers, chills, fatigue, lassitude. HEENT:   No headaches,  Difficulty swallowing,  Tooth/dental problems,  Sore throat,                No sneezing, itching, ear ache,                                + sniffing CV:  No chest pain, orthopnea, PND, swelling in lower extremities, anasarca, dizziness, palpitations GI  No heartburn, indigestion, abdominal pain, nausea,  vomiting, diarrhea, change in bowel habits, loss of appetite Resp: Today-  .  No coughing up of blood.  No change in color of mucus.   Skin: no rash or lesions. GU: no dysuria, change in color of urine, no urgency or frequency.  No flank pain. MS:  No joint pain or swelling.  No decreased range of motion.  No back pain. Psych:  No change in mood or affect. No depression or anxiety.  No memory loss.      Objective:   Physical Exam General- Alert, Oriented, Affect-appropriate, Distress- none acute; not toxic but subdued. Tall, trim Skin- rash-none, lesions- none, excoriation- none Lymphadenopathy- none Head- atraumatic            Eyes- Gross vision intact, PERRLA, conjunctivae clear secretions            Ears- Hearing, canals normal            Nose- Clear, no-Septal dev, mucus, polyps, erosion, perforation             Throat- Mallampati II , mucosa clear , drainage- none, tonsils- atrophic Neck- flexible , trachea midline, no stridor , thyroid nl, carotid no bruit Chest - symmetrical excursion , unlabored           Heart/CV- RRR , no murmur ,  no gallop  , no rub, nl s1 s2                           - JVD- none , edema- none, stasis changes- none, varices- none           Lung- clear to P&A, +wheeze- bilateral without accessory muscle use,             cough- none , dullness-none, rub- none           Chest wall-  Abd- tender-no, distended-no, bowel sounds-present, HSM- no Br/ Gen/ Rectal- Not done, not indicated Extrem- cyanosis- none, clubbing, none, atrophy- none, strength- nl Neuro- grossly intact to observation         Assessment & Plan:     Review of Systems     Objective:   Physical Exam        Assessment & Plan:

## 2011-01-05 ENCOUNTER — Telehealth: Payer: Self-pay | Admitting: Pulmonary Disease

## 2011-01-05 ENCOUNTER — Encounter: Payer: Self-pay | Admitting: *Deleted

## 2011-01-05 ENCOUNTER — Ambulatory Visit (INDEPENDENT_AMBULATORY_CARE_PROVIDER_SITE_OTHER): Payer: 59

## 2011-01-05 DIAGNOSIS — J309 Allergic rhinitis, unspecified: Secondary | ICD-10-CM

## 2011-01-05 NOTE — Telephone Encounter (Signed)
Spoke with pt and notified of recs per [facility]. He states that the note that [facility] described will do. Wants to pick this up once completed.

## 2011-01-05 NOTE — Telephone Encounter (Signed)
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  He may get sob because it is a heavy load.  Most people do get sob lifting heavy items.   He has to do decide whether he can do that job or not, and whether he needs to consider a change.  If I write a note for "light loads", he may be at risk for losing his job.  He really needs to think about this.

## 2011-01-05 NOTE — Telephone Encounter (Signed)
i usually do not define what loads pt can and can't do, that is the job of an occupational md.  I am happy to write a note saying has significant asthma that may limit his capacity to lift loads.  Then he and his employer have to decide what that is.

## 2011-01-05 NOTE — Telephone Encounter (Signed)
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 Pt states when he does this he gets out of breath easily. Pt states he spoke with his supervisor and advised him the only way he can do light duties if he had a note from his physician stating so. Pt is wanting to know if Dr. Shelle Iron would be willing to write him a note. Please advise Dr. Shelle Iron Thanks  Carver Fila, CMA

## 2011-01-05 NOTE — Telephone Encounter (Signed)
Closed message before finishing note.  I spoke with the pt and he states his boss is ok with him doing light loads. He also states the he is looking for another job, but has not found another one yet. He requests that Dr. Shelle Iron write the letter statign he can only do "light loads." Please advise. Carron Curie, CMA

## 2011-01-05 NOTE — Telephone Encounter (Signed)
I spoke with the pt and advised of KC recs. He states that his job will not have an issue with him doing "light loads." He states he is looking for another job at this time but he has not found one yet.

## 2011-01-05 NOTE — Telephone Encounter (Signed)
See response

## 2011-01-06 NOTE — Telephone Encounter (Signed)
Pt picked up letter yesterday so will sign off message

## 2011-01-08 ENCOUNTER — Ambulatory Visit (INDEPENDENT_AMBULATORY_CARE_PROVIDER_SITE_OTHER): Payer: 59

## 2011-01-08 ENCOUNTER — Encounter: Payer: Self-pay | Admitting: Internal Medicine

## 2011-01-08 DIAGNOSIS — J309 Allergic rhinitis, unspecified: Secondary | ICD-10-CM

## 2011-01-12 ENCOUNTER — Ambulatory Visit (INDEPENDENT_AMBULATORY_CARE_PROVIDER_SITE_OTHER): Payer: 59

## 2011-01-12 DIAGNOSIS — J309 Allergic rhinitis, unspecified: Secondary | ICD-10-CM

## 2011-01-14 NOTE — ED Provider Notes (Signed)
Medical screening examination/treatment/procedure(s) were performed by non-physician practitioner and as supervising physician I was immediately available for consultation/collaboration.   Joya Gaskins, MD 01/14/11 [phone]

## 2011-01-15 ENCOUNTER — Ambulatory Visit (INDEPENDENT_AMBULATORY_CARE_PROVIDER_SITE_OTHER): Payer: 59

## 2011-01-15 DIAGNOSIS — J309 Allergic rhinitis, unspecified: Secondary | ICD-10-CM

## 2011-01-19 ENCOUNTER — Ambulatory Visit (INDEPENDENT_AMBULATORY_CARE_PROVIDER_SITE_OTHER): Payer: 59

## 2011-01-19 DIAGNOSIS — J309 Allergic rhinitis, unspecified: Secondary | ICD-10-CM

## 2011-01-20 ENCOUNTER — Ambulatory Visit (INDEPENDENT_AMBULATORY_CARE_PROVIDER_SITE_OTHER): Payer: 59

## 2011-01-20 DIAGNOSIS — J309 Allergic rhinitis, unspecified: Secondary | ICD-10-CM

## 2011-01-22 ENCOUNTER — Ambulatory Visit (INDEPENDENT_AMBULATORY_CARE_PROVIDER_SITE_OTHER): Payer: 59

## 2011-01-22 DIAGNOSIS — J309 Allergic rhinitis, unspecified: Secondary | ICD-10-CM

## 2011-01-26 ENCOUNTER — Ambulatory Visit (INDEPENDENT_AMBULATORY_CARE_PROVIDER_SITE_OTHER): Payer: 59

## 2011-01-26 DIAGNOSIS — J309 Allergic rhinitis, unspecified: Secondary | ICD-10-CM

## 2011-01-29 ENCOUNTER — Ambulatory Visit (INDEPENDENT_AMBULATORY_CARE_PROVIDER_SITE_OTHER): Payer: 59

## 2011-01-29 DIAGNOSIS — J309 Allergic rhinitis, unspecified: Secondary | ICD-10-CM

## 2011-02-01 ENCOUNTER — Telehealth: Payer: Self-pay | Admitting: Internal Medicine

## 2011-02-01 DIAGNOSIS — J45909 Unspecified asthma, uncomplicated: Secondary | ICD-10-CM

## 2011-02-01 MED ORDER — PREDNISONE 20 MG PO TABS: 20.0000 mg | ORAL_TABLET | Freq: Every day | ORAL | Status: AC

## 2011-02-01 NOTE — Telephone Encounter (Signed)
Rx for prednisone was refilled. LMTCB.

## 2011-02-02 ENCOUNTER — Ambulatory Visit (INDEPENDENT_AMBULATORY_CARE_PROVIDER_SITE_OTHER): Payer: 59

## 2011-02-02 DIAGNOSIS — J309 Allergic rhinitis, unspecified: Secondary | ICD-10-CM

## 2011-02-02 NOTE — Telephone Encounter (Signed)
LMOM for pt to be made aware that the rx for pred refilled.

## 2011-02-03 LAB — DIFFERENTIAL
Basophils Relative: 1
Eosinophils Absolute: 0.1
Eosinophils Relative: 2
Neutrophils Relative %: 79 — ABNORMAL HIGH

## 2011-02-03 LAB — CBC
HCT: 38.5 — ABNORMAL LOW
MCHC: 35.2
MCV: 89.7
Platelets: 172
RDW: 13.4

## 2011-02-05 ENCOUNTER — Encounter: Payer: Self-pay | Admitting: Internal Medicine

## 2011-02-05 ENCOUNTER — Ambulatory Visit (INDEPENDENT_AMBULATORY_CARE_PROVIDER_SITE_OTHER): Payer: 59

## 2011-02-05 DIAGNOSIS — J309 Allergic rhinitis, unspecified: Secondary | ICD-10-CM

## 2011-02-09 ENCOUNTER — Ambulatory Visit (INDEPENDENT_AMBULATORY_CARE_PROVIDER_SITE_OTHER): Payer: 59

## 2011-02-09 DIAGNOSIS — J309 Allergic rhinitis, unspecified: Secondary | ICD-10-CM

## 2011-02-11 LAB — RAPID STREP SCREEN (MED CTR MEBANE ONLY): Streptococcus, Group A Screen (Direct): NEGATIVE

## 2011-02-11 LAB — STREP A DNA PROBE

## 2011-02-12 ENCOUNTER — Ambulatory Visit (INDEPENDENT_AMBULATORY_CARE_PROVIDER_SITE_OTHER): Payer: 59

## 2011-02-12 DIAGNOSIS — J309 Allergic rhinitis, unspecified: Secondary | ICD-10-CM

## 2011-02-16 ENCOUNTER — Ambulatory Visit (INDEPENDENT_AMBULATORY_CARE_PROVIDER_SITE_OTHER): Payer: 59

## 2011-02-16 DIAGNOSIS — J309 Allergic rhinitis, unspecified: Secondary | ICD-10-CM

## 2011-02-18 ENCOUNTER — Ambulatory Visit (INDEPENDENT_AMBULATORY_CARE_PROVIDER_SITE_OTHER): Payer: 59 | Admitting: Internal Medicine

## 2011-02-18 ENCOUNTER — Encounter: Payer: Self-pay | Admitting: Internal Medicine

## 2011-02-18 VITALS — BP 122/80 | HR 76 | Ht 75.0 in | Wt 184.4 lb

## 2011-02-18 DIAGNOSIS — J45909 Unspecified asthma, uncomplicated: Secondary | ICD-10-CM

## 2011-02-18 DIAGNOSIS — J4550 Severe persistent asthma, uncomplicated: Secondary | ICD-10-CM

## 2011-02-18 DIAGNOSIS — Z23 Encounter for immunization: Secondary | ICD-10-CM

## 2011-02-18 LAB — BASIC METABOLIC PANEL
BUN: 10
CO2: 26
Chloride: 105
Creatinine, Ser: 0.94
Potassium: 3.5

## 2011-02-18 LAB — CBC
HCT: 38.7 — ABNORMAL LOW
MCHC: 34.1
MCV: 89.6
RBC: 4.31
WBC: 4.4

## 2011-02-18 LAB — DIFFERENTIAL
Basophils Relative: 1
Eosinophils Absolute: 0.1
Eosinophils Relative: 3
Lymphs Abs: 1.3
Monocytes Relative: 13 — ABNORMAL HIGH
Neutrophils Relative %: 53

## 2011-02-18 NOTE — Assessment & Plan Note (Signed)
Better control. We can try to taper prednisone some. Explained steroid withdrawal, adrenal issues.  Flu vax

## 2011-02-18 NOTE — Progress Notes (Signed)
Patient ID: Drew Sandoval, male    DOB: 1975-11-16, 35 y.o.   MRN: [id number]  HPI 11/04/10- 60 yo former smoker followed for severe asthma, chronic sinusitis, hype-reosinophilia and hyper IgE Sent to me by Dr Shelle Iron for allergy evaluation. 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 Allergy profile- 08/11/10 IgE 1,747 with broad specific elevation Last here- August 31, 2010.- note reviewed. Since last here has been in hospital again, getting out 2 weeks ago. Feels well today.  Comes today as planned for allergy skin testing Skin test: Strongly positive especially grass, tree, dust mite  01/04/11- 43 yo former smoker followed for severe asthma, chronic sinusitis, hyper-eosinophilia and hyper IgE.  wife is with him Sent to me by Dr Shelle Iron for allergy evaluation. 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 Allergy vaccine was started 11/09/2010 and is building without problems He was still on prednisone when he saw Dr Shelle Iron recently and at that time he seemed improved after a recent exacerbation- 2 weeks ago. About 2 weeks after their visit he began coughing again. He hasn't noted any unusual exposure at work, or any sense that he had a cold.  His last IgE was 1747.  He doesn't feel "sick"- just tight and wheezy.  02/18/11-  35 yo former smoker followed for severe asthma, chronic sinusitis, hyper-eosinophilia and hyper IgE.  wife is with him Cooler weather is more comfortable for him. He continues to build allergy vaccine, now at 1:500, without problems. Some days continued to be better than others. 2 days ago thought his eyes were starting to itch and took Benadryl. We discussed comparison antihistamines. Not much sneezing. He has not needed his rescue inhaler in a long time but continues prednisone 20 mg daily. We educated on prednisone again.   Review of  Systems Constitutional:   No weight loss, night sweats,  Fevers, chills, fatigue, lassitude. HEENT:   No headaches,  Difficulty swallowing,  Tooth/dental problems,  Sore throat,                No sneezing, ear ache,                                + some sniffing, throat itches CV:  No chest pain, orthopnea, PND, swelling in lower extremities, anasarca, dizziness, palpitations GI  No heartburn, indigestion, abdominal pain, nausea, vomiting, diarrhea, change in bowel habits, loss of appetite Resp: Today-  .  No coughing up of blood.  No change in color of mucus.   Skin: no rash or lesions. GU: no dysuria, change in color of urine, no urgency or frequency.  No flank pain. MS:  No joint pain or swelling.  No decreased range of motion.  No back pain. Psych:  No change in mood or affect. No depression or anxiety.  No memory loss.      Objective:   Physical Exam General- Alert, Oriented, Affect-appropriate, Distress- none acute; not toxic but subdued. Tall, trim Skin- rash-none, lesions- none, excoriation- none Lymphadenopathy- none Head- atraumatic            Eyes- Gross vision intact, PERRLA, conjunctivae clear secretions            Ears- Hearing, canals normal  Nose- Clear, no-Septal dev, mucus, polyps, erosion, perforation             Throat- Mallampati II , mucosa clear , drainage- none, tonsils- atrophic Neck- flexible , trachea midline, no stridor , thyroid nl, carotid no bruit Chest - symmetrical excursion , unlabored           Heart/CV- RRR , no murmur , no gallop  , no rub, nl s1 s2                           - JVD- none , edema- none, stasis changes- none, varices- none           Lung- clear to P&A,no wheeze            cough- none , dullness-none, rub- none           Chest wall-  Abd- tender-no, distended-no, bowel sounds-present, HSM- no Br/ Gen/ Rectal- Not done, not indicated Extrem- cyanosis- none, clubbing, none, atrophy- none, strength- nl Neuro- grossly intact to  observation

## 2011-02-18 NOTE — Patient Instructions (Addendum)
Flu vax  Ok to try slow prednisone taper, to lowest dose that holds you comfortable.     Try cutting your 20 mg tabs into halves, then half some of those to make 1/4 tablets.   Try taking 3/4 tab(15 mg) x 1 week. If stable, then try taking 1/2 tab (10 mg) daily for a week, then 1/4 tab (5 mg) for 1 week., then quit if your still feel comfortable and controlled.

## 2011-02-19 ENCOUNTER — Ambulatory Visit (INDEPENDENT_AMBULATORY_CARE_PROVIDER_SITE_OTHER): Payer: 59

## 2011-02-19 DIAGNOSIS — J309 Allergic rhinitis, unspecified: Secondary | ICD-10-CM

## 2011-02-23 ENCOUNTER — Ambulatory Visit (INDEPENDENT_AMBULATORY_CARE_PROVIDER_SITE_OTHER): Payer: 59

## 2011-02-23 DIAGNOSIS — J309 Allergic rhinitis, unspecified: Secondary | ICD-10-CM

## 2011-02-24 ENCOUNTER — Ambulatory Visit (INDEPENDENT_AMBULATORY_CARE_PROVIDER_SITE_OTHER): Payer: 59

## 2011-02-24 DIAGNOSIS — J309 Allergic rhinitis, unspecified: Secondary | ICD-10-CM

## 2011-02-26 ENCOUNTER — Ambulatory Visit (INDEPENDENT_AMBULATORY_CARE_PROVIDER_SITE_OTHER): Payer: 59

## 2011-02-26 DIAGNOSIS — J309 Allergic rhinitis, unspecified: Secondary | ICD-10-CM

## 2011-03-02 ENCOUNTER — Ambulatory Visit (INDEPENDENT_AMBULATORY_CARE_PROVIDER_SITE_OTHER): Payer: 59

## 2011-03-02 DIAGNOSIS — J309 Allergic rhinitis, unspecified: Secondary | ICD-10-CM

## 2011-03-03 ENCOUNTER — Telehealth: Payer: Self-pay | Admitting: *Deleted

## 2011-03-03 NOTE — Telephone Encounter (Signed)
I'm puzzled;here's why. I gave Mr.Elsey his shot 02-26-11(new vials 1:50 0.2) a knot came up about the size of a thumbtack.(lasted a day and 1/2 to 2 days) This wk.(03/02/11 1:50 0.15) a bigger knot came up(he called this morning). Am I doing something wrong? Please let me know.

## 2011-03-03 NOTE — Telephone Encounter (Signed)
Let's divide vials at 0.15 ml and see what happens.

## 2011-03-05 ENCOUNTER — Ambulatory Visit (INDEPENDENT_AMBULATORY_CARE_PROVIDER_SITE_OTHER): Payer: 59

## 2011-03-05 ENCOUNTER — Telehealth: Payer: Self-pay | Admitting: *Deleted

## 2011-03-05 DIAGNOSIS — J309 Allergic rhinitis, unspecified: Secondary | ICD-10-CM

## 2011-03-05 NOTE — Telephone Encounter (Signed)
[name] came in today for his allergy injection-had a pea sized reaction on his Right arm-was given 0.15 of Vial A-contains: 9 [place] grass, Ragweed, cocklebur, and feather. Pt was given a loratadine 10mg  tablet while in the office and was monitored for approx 15-20 minutes with no other reactions. I allowed patient to leave with the understanding if any other reactions happened or worsened then he was to seek care at nearest ER. Pt verbalized his understanding. Will send to [facility] for advice on what to decrease patient to for next injections.

## 2011-03-05 NOTE — Telephone Encounter (Signed)
Pt started new vial of 1:50 on 02-23-2011

## 2011-03-05 NOTE — Telephone Encounter (Signed)
Pt aware and we have documented in allergy for this dose and see how patients does.

## 2011-03-08 NOTE — Telephone Encounter (Signed)
I discussed with T.S. and advised splitting vials for now, with observation.

## 2011-03-09 ENCOUNTER — Ambulatory Visit (INDEPENDENT_AMBULATORY_CARE_PROVIDER_SITE_OTHER): Payer: 59

## 2011-03-09 DIAGNOSIS — J309 Allergic rhinitis, unspecified: Secondary | ICD-10-CM

## 2011-03-12 ENCOUNTER — Ambulatory Visit (INDEPENDENT_AMBULATORY_CARE_PROVIDER_SITE_OTHER): Payer: 59

## 2011-03-12 DIAGNOSIS — J309 Allergic rhinitis, unspecified: Secondary | ICD-10-CM

## 2011-03-16 ENCOUNTER — Ambulatory Visit (INDEPENDENT_AMBULATORY_CARE_PROVIDER_SITE_OTHER): Payer: 59

## 2011-03-16 DIAGNOSIS — J309 Allergic rhinitis, unspecified: Secondary | ICD-10-CM

## 2011-03-19 ENCOUNTER — Ambulatory Visit (INDEPENDENT_AMBULATORY_CARE_PROVIDER_SITE_OTHER): Payer: 59

## 2011-03-19 DIAGNOSIS — J309 Allergic rhinitis, unspecified: Secondary | ICD-10-CM

## 2011-03-22 ENCOUNTER — Ambulatory Visit: Payer: 59

## 2011-03-23 ENCOUNTER — Ambulatory Visit (INDEPENDENT_AMBULATORY_CARE_PROVIDER_SITE_OTHER): Payer: 59 | Admitting: Pulmonary Disease

## 2011-03-23 ENCOUNTER — Encounter: Payer: Self-pay | Admitting: Pulmonary Disease

## 2011-03-23 ENCOUNTER — Ambulatory Visit (INDEPENDENT_AMBULATORY_CARE_PROVIDER_SITE_OTHER): Payer: 59

## 2011-03-23 DIAGNOSIS — J320 Chronic maxillary sinusitis: Secondary | ICD-10-CM

## 2011-03-23 DIAGNOSIS — J45909 Unspecified asthma, uncomplicated: Secondary | ICD-10-CM

## 2011-03-23 DIAGNOSIS — J309 Allergic rhinitis, unspecified: Secondary | ICD-10-CM

## 2011-03-23 DIAGNOSIS — J4550 Severe persistent asthma, uncomplicated: Secondary | ICD-10-CM

## 2011-03-23 MED ORDER — MOXIFLOXACIN HCL 400 MG PO TABS: 400.0000 mg | ORAL_TABLET | Freq: Every day | ORAL | Status: AC

## 2011-03-23 NOTE — Progress Notes (Signed)
  Subjective:    Patient ID: Drew Sandoval, male    DOB: [birth year], 35 y.o.   MRN: [id number]  HPI The patient comes in today for followup of his known severe asthma with chronic sinusitis and significant allergic disease.  He has been on aggressive asthma regimen, as well as allergy vaccine.  Overall he has seen significant improvement in his breathing, and has not been to the emergency room in quite some time.  He is now off prednisone and seems to be holding his own.  His only complaint today is that of worsening sinus congestion with purulent material, and also has seen some worsening of his breathing.  However, he has not had to use his rescue inhaler.  He denies any fevers or chills.   Review of Systems  Constitutional: Negative for fever and unexpected weight change.  HENT: Positive for congestion and sore throat. Negative for ear pain, nosebleeds, rhinorrhea, sneezing, trouble swallowing, dental problem, postnasal drip and sinus pressure.   Eyes: Negative for redness and itching.  Respiratory: Positive for cough, shortness of breath and wheezing. Negative for chest tightness.   Cardiovascular: Negative for palpitations and leg swelling.  Gastrointestinal: Negative for nausea and vomiting.  Genitourinary: Negative for dysuria.  Musculoskeletal: Negative for joint swelling.  Skin: Negative for rash.  Neurological: Positive for headaches.  Hematological: Does not bruise/bleed easily.  Psychiatric/Behavioral: Negative for dysphoric mood. The patient is not nervous/anxious.        Objective:   Physical Exam Well-developed male in no acute distress No purulence or discharge noted from nose Chest completely clear, with good air flow bilaterally Cardiac exam with regular rate and rhythm Lower extremities without edema, no cyanosis noted Alert, oriented, moves all 4.         Assessment & Plan:

## 2011-03-23 NOTE — Patient Instructions (Signed)
Will treat with avelox 400mg  one each day for 7 days for possible sinusitis Continue nasal rinses am and pm until better, then back to at night only if doing well. No change in asthma meds, but call if you think you are not doing well from a breathing standpoint Continue on allergy vaccine followup with me in 3mos.

## 2011-03-23 NOTE — Assessment & Plan Note (Signed)
The pt has symptoms suggestive of acute on chronic sinusutis.  Will treat with a short course of abx, and have asked him to be more consistent with sinus rinses.  If he continues to have recurrent issues, will need to see ent.

## 2011-03-23 NOTE — Assessment & Plan Note (Signed)
The pt has been doing fairly well, even off prednisone.  He has not been using his rescue inhaler.  He has noticed some worsening with the weather change, and recent sx of sinusitis.  His lungs are clear today, so will try to avoid steroid taper.  He is to continue on his asthma meds and allergy vaccine, and to call us if symptoms are worsening.

## 2011-03-26 ENCOUNTER — Ambulatory Visit (INDEPENDENT_AMBULATORY_CARE_PROVIDER_SITE_OTHER): Payer: 59

## 2011-03-26 DIAGNOSIS — J309 Allergic rhinitis, unspecified: Secondary | ICD-10-CM

## 2011-03-30 ENCOUNTER — Ambulatory Visit (INDEPENDENT_AMBULATORY_CARE_PROVIDER_SITE_OTHER): Payer: 59

## 2011-03-30 DIAGNOSIS — J309 Allergic rhinitis, unspecified: Secondary | ICD-10-CM

## 2011-04-02 ENCOUNTER — Ambulatory Visit (INDEPENDENT_AMBULATORY_CARE_PROVIDER_SITE_OTHER): Payer: 59

## 2011-04-02 DIAGNOSIS — J309 Allergic rhinitis, unspecified: Secondary | ICD-10-CM

## 2011-04-05 ENCOUNTER — Encounter: Payer: Self-pay | Admitting: Internal Medicine

## 2011-04-06 ENCOUNTER — Ambulatory Visit (INDEPENDENT_AMBULATORY_CARE_PROVIDER_SITE_OTHER): Payer: 59

## 2011-04-06 DIAGNOSIS — J309 Allergic rhinitis, unspecified: Secondary | ICD-10-CM

## 2011-04-09 ENCOUNTER — Ambulatory Visit (INDEPENDENT_AMBULATORY_CARE_PROVIDER_SITE_OTHER): Payer: 59

## 2011-04-09 DIAGNOSIS — J309 Allergic rhinitis, unspecified: Secondary | ICD-10-CM

## 2011-04-11 ENCOUNTER — Emergency Department (HOSPITAL_COMMUNITY): Payer: 59

## 2011-04-11 ENCOUNTER — Emergency Department (HOSPITAL_COMMUNITY)
Admission: EM | Admit: 2011-04-11 | Discharge: 2011-04-11 | Disposition: A | Discharge status: Home / Self Care | Payer: 59 | Attending: Emergency Medicine | Admitting: Emergency Medicine

## 2011-04-11 ENCOUNTER — Encounter (HOSPITAL_COMMUNITY): Payer: Self-pay

## 2011-04-11 ENCOUNTER — Telehealth: Payer: Self-pay | Admitting: Internal Medicine

## 2011-04-11 DIAGNOSIS — J441 Chronic obstructive pulmonary disease with (acute) exacerbation: Secondary | ICD-10-CM | POA: Insufficient documentation

## 2011-04-11 DIAGNOSIS — I498 Other specified cardiac arrhythmias: Secondary | ICD-10-CM | POA: Insufficient documentation

## 2011-04-11 DIAGNOSIS — Z87891 Personal history of nicotine dependence: Secondary | ICD-10-CM | POA: Insufficient documentation

## 2011-04-11 DIAGNOSIS — R0902 Hypoxemia: Secondary | ICD-10-CM | POA: Insufficient documentation

## 2011-04-11 LAB — BLOOD GAS, ARTERIAL
Acid-base deficit: 1 mmol/L (ref 0.0–2.0)
O2 Content: 4 L/min
O2 Saturation: 93 %
TCO2: 20.3 mmol/L (ref 0–100)
pCO2 arterial: 37.4 mmHg (ref 35.0–45.0)
pO2, Arterial: 66.2 mmHg — ABNORMAL LOW (ref 80.0–100.0)

## 2011-04-11 LAB — BASIC METABOLIC PANEL
BUN: 14 mg/dL (ref 6–23)
Calcium: 9.4 mg/dL (ref 8.4–10.5)
Chloride: 105 mEq/L (ref 96–112)
Creatinine, Ser: 0.96 mg/dL (ref 0.50–1.35)
GFR calc Af Amer: >90 mL/min (ref >90)

## 2011-04-11 LAB — CARDIAC PANEL(CRET KIN+CKTOT+MB+TROPI)
CK, MB: 1.4 ng/mL (ref 0.3–4.0)
Total CK: 101 U/L (ref 7–232)

## 2011-04-11 LAB — CBC
HCT: 43.3 % (ref 39.0–52.0)
MCH: 30.3 pg (ref 26.0–34.0)
MCV: 90.4 fL (ref 78.0–100.0)
Platelets: 217 10*3/uL (ref 150–400)
RDW: 13.7 % (ref 11.5–15.5)

## 2011-04-11 LAB — D-DIMER, QUANTITATIVE: D-Dimer, Quant: 0.93 ug/mL-FEU — ABNORMAL HIGH (ref 0.00–0.48)

## 2011-04-11 MED ORDER — ALBUTEROL SULFATE (5 MG/ML) 0.5% IN NEBU
5.0000 mg | INHALATION_SOLUTION | Freq: Once | RESPIRATORY_TRACT | Status: AC
  Administered 2011-04-11: 5 mg via RESPIRATORY_TRACT
  Filled 2011-04-11: qty 1

## 2011-04-11 MED ORDER — SODIUM CHLORIDE 0.9 % IV SOLN
Freq: Once | INTRAVENOUS | Status: AC
  Administered 2011-04-11: 09:00:00 via INTRAVENOUS

## 2011-04-11 MED ORDER — PREDNISONE 10 MG PO TABS: ORAL_TABLET | ORAL | Status: DC

## 2011-04-11 MED ORDER — ACETAMINOPHEN 500 MG PO TABS
1000.0000 mg | ORAL_TABLET | Freq: Once | ORAL | Status: AC
  Administered 2011-04-11: 1000 mg via ORAL
  Filled 2011-04-11: qty 2

## 2011-04-11 MED ORDER — HYDROCOD POLST-CHLORPHEN POLST 10-8 MG/5ML PO LQCR: 5.0000 mL | Freq: Two times a day (BID) | ORAL | Status: DC

## 2011-04-11 MED ORDER — AZITHROMYCIN 250 MG PO TABS: ORAL_TABLET | ORAL | Status: DC

## 2011-04-11 MED ORDER — IOHEXOL 350 MG/ML SOLN
100.0000 mL | Freq: Once | INTRAVENOUS | Status: AC | PRN
  Administered 2011-04-11: 100 mL via INTRAVENOUS

## 2011-04-11 MED ORDER — METHYLPREDNISOLONE SODIUM SUCC 125 MG IJ SOLR
125.0000 mg | Freq: Once | INTRAMUSCULAR | Status: AC
  Administered 2011-04-11: 125 mg via INTRAVENOUS
  Filled 2011-04-11: qty 2

## 2011-04-11 MED ORDER — ALBUTEROL SULFATE (5 MG/ML) 0.5% IN NEBU
2.5000 mg | INHALATION_SOLUTION | RESPIRATORY_TRACT | Status: DC
  Administered 2011-04-11 (×2): 2.5 mg via RESPIRATORY_TRACT
  Filled 2011-04-11 (×2): qty 0.5

## 2011-04-11 MED ORDER — IPRATROPIUM BROMIDE 0.02 % IN SOLN
0.5000 mg | RESPIRATORY_TRACT | Status: DC
  Administered 2011-04-11 (×2): 0.5 mg via RESPIRATORY_TRACT
  Filled 2011-04-11 (×2): qty 2.5

## 2011-04-11 MED ORDER — HYDROCOD POLST-CHLORPHEN POLST 10-8 MG/5ML PO LQCR
5.0000 mL | Freq: Once | ORAL | Status: AC
  Administered 2011-04-11: 5 mL via ORAL
  Filled 2011-04-11: qty 5

## 2011-04-11 NOTE — ED Notes (Signed)
Pt's HR increased to 160 while PA in the room.  Continuing to monitor pt.

## 2011-04-11 NOTE — ED Notes (Signed)
PT's room air o2 sat 88%.  Pt says is not SOB.  NOtified H. Beverely Pace PA and asked pt to reconsider staying and being admitted but pt refuses.  Instructed family to return for any worsening SOB or heart racing.   Pt and family   Verbalized understanding.

## 2011-04-11 NOTE — ED Notes (Signed)
Pt's wife states pt has had cough, generalized body aches, wheezing, and chest since yesterday.  Called allergist and was started on prednisone and azithromycin.  Pt says is no better.  PT tearful.  Uses albuterol nebs at home but says hasn't helped.

## 2011-04-11 NOTE — ED Notes (Addendum)
Patient with 13 beat run of Vtach on monitor. Patient never lost consciousness, but stated he felt his heart racing. Patient diaphoretic when assessed after episode. Remains tachycardic at 118 with PVCs noted. Link Snuffer, [place] and Dr Effie Shy made aware and informed of pt condition. Verbal order for EKG obtained and Lupita Leash, ED tech in to obtain EKG.   Monitor strip placed on strip paper and placed with patient's chart.

## 2011-04-11 NOTE — Telephone Encounter (Signed)
On call 04/10/11- Wife reported he had sinus headache, cough till retch. Plan- Zpak and prednisone taper called to [facility] [phone]

## 2011-04-11 NOTE — ED Notes (Signed)
[name] PA at bedside, labs ordered.  Pt says feels a little better.

## 2011-04-11 NOTE — ED Provider Notes (Signed)
History     CSN: [id number] Arrival date & time: 04/11/2011  8:30 AM   First MD Initiated Contact with Patient 04/11/11 [phone]      Chief Complaint  Patient presents with  . Shortness of Breath    (Consider location/radiation/quality/duration/timing/severity/associated sxs/prior treatment) Patient is a 35 y.o. male presenting with shortness of breath. The history is provided by the patient.  Shortness of Breath  The current episode started yesterday. The problem occurs frequently. The problem has been gradually worsening. The problem is severe. The symptoms are relieved by nothing. The symptoms are aggravated by nothing. Associated symptoms include a fever, cough, shortness of breath and wheezing. Pertinent negatives include no chest pain. He was not exposed to toxic fumes. He has had prior hospitalizations. He has had prior ICU admissions. He has had prior intubations. His past medical history is significant for asthma and past wheezing. He has been behaving normally. Urine output has been normal. Recently, medical care has been given by a specialist. Services received include medications given.    Past Medical History  Diagnosis Date  . Asthma   . Seasonal allergies     History reviewed. No pertinent past surgical history.  Family History  Problem Relation Age of Onset  . Emphysema Father     History  Substance Use Topics  . Smoking status: Former Smoker -- 0.5 packs/day for 11.5 years    Types: Cigarettes    Quit date: 04/09/2010  . Smokeless tobacco: Never Used   Comment: started at age 59.  1/2 ppd.    . Alcohol Use: No      Review of Systems  Constitutional: Positive for fever. Negative for activity change.       All ROS Neg except as noted in HPI  HENT: Negative for nosebleeds and neck pain.   Eyes: Negative for photophobia and discharge.  Respiratory: Positive for cough, shortness of breath and wheezing.   Cardiovascular: Negative for chest pain and  palpitations.  Gastrointestinal: Negative for abdominal pain and blood in stool.  Genitourinary: Negative for dysuria, frequency and hematuria.  Musculoskeletal: Negative for back pain and arthralgias.  Skin: Negative.   Neurological: Negative for dizziness, seizures and speech difficulty.  Psychiatric/Behavioral: Negative for hallucinations and confusion.    Allergies  Penicillins  Home Medications   Current Outpatient Rx  Name Route Sig Dispense Refill  . ALBUTEROL SULFATE HFA 108 (90 BASE) MCG/ACT IN AERS Inhalation Inhale 1-2 puffs into the lungs every 6 (six) hours as needed for wheezing. 1 Inhaler 0  . ALBUTEROL SULFATE (2.5 MG/3ML) 0.083% IN NEBU Nebulization Take 2.5 mg by nebulization every 6 (six) hours as needed. For shortness of breath 75 mL 2  . DIPHENHYDRAMINE HCL 25 MG PO TABS Oral Take 25 mg by mouth at bedtime.      Marland Kitchen EPINEPHRINE 0.3 MG/0.3ML IJ DEVI Subcutaneous Inject 0.3 mLs (0.3 mg total) into the skin once as needed (For severe asthma or allergic reaction). 1 Device 6  . IBUPROFEN 200 MG PO TABS Oral Take 400 mg by mouth every 6 (six) hours as needed. pain    . MOMETASONE FURO-FORMOTEROL FUM 200-5 MCG/ACT IN AERO Inhalation Inhale 2 puffs into the lungs 2 (two) times daily. 1 Inhaler 5  . MONTELUKAST SODIUM 10 MG PO TABS Oral Take 10 mg by mouth at bedtime.        BP 133/93  Pulse 125  Temp(Src) 100.9 F (38.3 C) (Oral)  Resp 32  Ht 6\' 3"  (1.905  m)  Wt 191 lb (86.637 kg)  BMI 23.87 kg/m2  SpO2 88%  Physical Exam  Nursing note and vitals reviewed. Constitutional: He is oriented to person, place, and time. He appears well-developed and well-nourished.  Non-toxic appearance.  HENT:  Head: Normocephalic.  Right Ear: Tympanic membrane and external ear normal.  Left Ear: Tympanic membrane and external ear normal.  Eyes: EOM and lids are normal. Pupils are equal, round, and reactive to light.  Neck: Normal range of motion. Neck supple. Carotid bruit is not  present.  Cardiovascular: Regular rhythm, normal heart sounds, intact distal pulses and normal pulses.  Tachycardia present.   Pulmonary/Chest: He is in respiratory distress. He has wheezes.  Abdominal: Soft. Bowel sounds are normal. There is no tenderness. There is no guarding.  Musculoskeletal: Normal range of motion.  Lymphadenopathy:       Head (right side): No submandibular adenopathy present.       Head (left side): No submandibular adenopathy present.    He has no cervical adenopathy.  Neurological: He is alert and oriented to person, place, and time. He has normal strength. No cranial nerve deficit or sensory deficit.  Skin: Skin is warm and dry.  Psychiatric: He has a normal mood and affect. His speech is normal.    ED Course: Pt had a 5 beat run of V-Tach on monitor without change in mentation or mental status. No decrease in blood pressure. EKG and cardiac markers obtained. Will check Electrolytes. Cardiac markers negative for acute changes. D-dimer elevated. CT ordered. CT angio chest neg for PE or mass .  Procedures (including critical care time)  Labs Reviewed - No data to display No results found. EKG: EKG at 11:11. Rate 101, Rhythm: Sinus Tachycardia. Nonspecific T wave changes. NO STEMI, or life threatening arrhythmias.  No diagnosis found.    MDM  Pt has a run of V-Tach without hemodynamic changes. EKG unchanged from October 21, 2010. Cardiac markers negative. CT Chest neg for PE, but ABG reveals O2 of 63 on O2. Discussed need for admission with pt. He refuses, states he has an appointment with his pulmonary MD tomorrow 12/3. O2 taken off and final vital signs obtained at d/c. Pulse ox 88. Pt again advised to be admitted. He refuses and request to be discharged with cough med. He states he has neb machine and prednisone and antibiotics at home.        Kathie Dike, [place] 04/11/11 [phone]

## 2011-04-12 ENCOUNTER — Telehealth: Payer: Self-pay | Admitting: Pulmonary Disease

## 2011-04-12 ENCOUNTER — Encounter (HOSPITAL_COMMUNITY): Payer: Self-pay | Admitting: General Practice

## 2011-04-12 ENCOUNTER — Emergency Department (HOSPITAL_COMMUNITY)
Admission: EM | Admit: 2011-04-12 | Discharge: 2011-04-12 | Disposition: A | Discharge status: Home / Self Care | Payer: 59 | Attending: Emergency Medicine | Admitting: Emergency Medicine

## 2011-04-12 DIAGNOSIS — J45909 Unspecified asthma, uncomplicated: Secondary | ICD-10-CM | POA: Insufficient documentation

## 2011-04-12 DIAGNOSIS — J40 Bronchitis, not specified as acute or chronic: Secondary | ICD-10-CM

## 2011-04-12 DIAGNOSIS — Z87891 Personal history of nicotine dependence: Secondary | ICD-10-CM | POA: Insufficient documentation

## 2011-04-12 MED ORDER — ALBUTEROL SULFATE (5 MG/ML) 0.5% IN NEBU
5.0000 mg | INHALATION_SOLUTION | Freq: Once | RESPIRATORY_TRACT | Status: AC
  Administered 2011-04-12: 5 mg via RESPIRATORY_TRACT
  Filled 2011-04-12: qty 1

## 2011-04-12 MED ORDER — PREDNISONE 20 MG PO TABS
60.0000 mg | ORAL_TABLET | Freq: Once | ORAL | Status: AC
  Administered 2011-04-12: 60 mg via ORAL
  Filled 2011-04-12: qty 3

## 2011-04-12 NOTE — ED Provider Notes (Signed)
History    This chart was scribed for [employer]. Colon Branch, MD, MD by Smitty Pluck. The patient was seen in room APA11 and the patient's care was started at 11:25AM.   CSN: [id number] Arrival date & time: 04/12/2011 10:32 AM   First MD Initiated Contact with Patient 04/12/11 1118      Chief Complaint  Patient presents with  . Shortness of Breath    (Consider location/radiation/quality/duration/timing/severity/associated sxs/prior treatment) The history is provided by the patient.   Drew Sandoval is a 35 y.o. male who presents to the Emergency Department complaining of SOB with exertion onset 1 day ago. Pt was in ED 1 day ago and was diagnosed with bronchitis and low oxygen. Pt states exertion aggravates symptoms and He states he initially felt better but SOB has returned. Pt takes albuterol, z-pack and prednisone. PCP is Dr. Maple Hudson.   Past Medical History  Diagnosis Date  . Asthma   . Seasonal allergies     History reviewed. No pertinent past surgical history.  Family History  Problem Relation Age of Onset  . Emphysema Father     History  Substance Use Topics  . Smoking status: Former Smoker -- 0.5 packs/day for 11.5 years    Types: Cigarettes    Quit date: 04/09/2010  . Smokeless tobacco: Never Used   Comment: started at age 10.  1/2 ppd.    . Alcohol Use: No      Review of Systems  All other systems reviewed and are negative.   10 Systems reviewed and are negative for acute change except as noted in the HPI.  Allergies  Penicillins  Home Medications   Current Outpatient Rx  Name Route Sig Dispense Refill  . ALBUTEROL SULFATE HFA 108 (90 BASE) MCG/ACT IN AERS Inhalation Inhale 1-2 puffs into the lungs every 6 (six) hours as needed for wheezing. 1 Inhaler 0  . ALBUTEROL SULFATE (2.5 MG/3ML) 0.083% IN NEBU Nebulization Take 2.5 mg by nebulization every 6 (six) hours as needed. For shortness of breath 75 mL 2  . AZITHROMYCIN 250 MG PO TABS Oral Take 250-500 mg by  mouth daily. Take 2 tablets (500mg ) on day one then 1 tablet (250mg ) for 4 days. Started on 04/10/11     . DIPHENHYDRAMINE HCL 25 MG PO TABS Oral Take 25 mg by mouth at bedtime.      . IBUPROFEN 200 MG PO TABS Oral Take 400 mg by mouth every 6 (six) hours as needed. pain    . MOMETASONE FURO-FORMOTEROL FUM 200-5 MCG/ACT IN AERO Inhalation Inhale 2 puffs into the lungs 2 (two) times daily. 1 Inhaler 5  . MONTELUKAST SODIUM 10 MG PO TABS Oral Take 10 mg by mouth at bedtime.      Marland Kitchen PREDNISONE 10 MG PO TABS Oral Take 10-40 mg by mouth daily. Tapering regimen: start at 40mg  then reduce down to 10 mg daily.     Marland Kitchen EPINEPHRINE 0.3 MG/0.3ML IJ DEVI Subcutaneous Inject 0.3 mLs (0.3 mg total) into the skin once as needed (For severe asthma or allergic reaction). 1 Device 6    BP 132/85  Pulse 82  Temp 98.9 F (37.2 C)  Resp 20  Ht 6\' 3"  (1.905 m)  Wt 191 lb (86.637 kg)  BMI 23.87 kg/m2  SpO2 96%  Physical Exam  Nursing note and vitals reviewed. Constitutional: He is oriented to person, place, and time. He appears well-developed and well-nourished. No distress.  HENT:  Head: Normocephalic and atraumatic.  Eyes: Conjunctivae  and EOM are normal. Pupils are equal, round, and reactive to light.  Neck: Normal range of motion. Neck supple. No tracheal deviation present.  Cardiovascular: Normal rate, regular rhythm and normal heart sounds.   Pulmonary/Chest: Effort normal. No respiratory distress.       Fair air movement Wheezing in the upper fields Wheezing with expiration throughout  Abdominal: Soft. Bowel sounds are normal. He exhibits no distension. There is no tenderness.  Neurological: He is alert and oriented to person, place, and time.  Skin: Skin is warm and dry.  Psychiatric: He has a normal mood and affect. His behavior is normal.    ED Course  Procedures (including critical care time)  DIAGNOSTIC STUDIES: Oxygen Saturation is 100% on room air, normal by my interpretation.     COORDINATION OF CARE:  2:13PM Recheck: Pt feels better. He has stopped wheezing. Dr discusses results and treatment. Pt is ready for discharge.  Labs Reviewed - No data to display Dg Chest 2 View  04/11/2011  *RADIOLOGY REPORT*  Clinical Data: Shortness of breath, fever, wheezing.  CHEST - 2 VIEW  Comparison: 12/15/2010  Findings: Chronic peribronchial thickening.  Heart is normal size. Lungs are clear.  No effusions or acute bony abnormality.  IMPRESSION: Chronic bronchitic changes.  Original Report Authenticated By: Cyndie Chime, M.D.   Ct Angio Chest W/cm &/or Wo Cm  04/11/2011  **ADDENDUM** CREATED: 04/11/2011 13:21:57  2 cm left thyroid nodule.  Recommend further evaluation with a dedicated thyroid ultrasound.  **END ADDENDUM** SIGNED BY: Richarda Overlie, M.D.   04/11/2011  *RADIOLOGY REPORT*  Clinical Data:  Cough and fever.  Elevated D-dimer.  CT ANGIOGRAPHY CHEST WITH CONTRAST  Technique:  Multidetector CT imaging of the chest was performed using the standard protocol during bolus administration of intravenous contrast.  Multiplanar CT image reconstructions including MIPs were obtained to evaluate the vascular anatomy.  Contrast: OMNIPAQUE IOHEXOL 350 MG/ML IV SOLN  Comparison:  10/04/2010  Findings:  No evidence for pulmonary emboli.  There is a 2.0 cm left thyroid nodule.  Images of the upper abdomen are unremarkable. There is bronchiectasis in the lower lobes bilaterally.   New small ground-glass opacifications in the medial right upper lung. Improved aeration in the right middle lobe compared to the prior examination.  There is bronchiectasis and a small nodule in the right middle lobe.  There are stable sub centimeter nodules in the right lower lobe.  Stable 7 mm ground-glass density in the right lower lobe on sequence five, image 62.  Few stable nodules in the left lung.  There are no large areas of consolidation.  No acute bony abnormalities.  Review of the MIP images confirms the  above findings.  IMPRESSION: Negative for pulmonary embolism.  Evidence for postinflammatory changes.  There is bronchiectasis in the lower lungs and multiple stable nodular densities.   New small ground-glass densities in the medial right upper lung could represent acute inflammation or infection. A 51-month follow-up may be useful to ensure stability of these nodules and ground-glass opacities.  Original Report Authenticated By: Richarda Overlie, M.D.       MDM  Patient with h/o asthma seen yesterday in the ER with respiratory distress and encouraged to be admitted due to low O2 sats and continued wheezing. Patient did not want to be admitted. He went to work and had difficulty with SOB when active. He has been started on both antibiotics and prednisone. Improved today, O2 sates remain 96-100% on RA with  ambulation.  Given nebulizer treatment and additional prednisone. Wheezing resolved.Pt stable in ED with no significant deterioration in condition.The patient appears reasonably screened and/or stabilized for discharge and I doubt any other medical condition or other [facility] requiring further screening, evaluation, or treatment in the ED at this time prior to discharge.  I personally performed the services described in this documentation, which was scribed in my presence. The recorded information has been reviewed and considered.     MDM Reviewed: nursing note, vitals and previous chart Reviewed previous: labs and x-ray Interpretation: x-ray     Nicoletta Dress. Colon Branch, MD 04/13/11 [phone]

## 2011-04-12 NOTE — Telephone Encounter (Signed)
Per CY-he called in Zpak and prednisone this weekend; best he can suggest is return to ER for eval and admit if necessary. Pt is aware and will go to [facility].

## 2011-04-12 NOTE — ED Notes (Signed)
Pt ambulatory around department-walked entire length of department x 1 while maintaining RA SpO2 of 91%.  No dyspnea noted. R20, P110.

## 2011-04-12 NOTE — ED Notes (Signed)
Reports was seen and tx yesterday for same-c/o cough, nasal congestion and shortness of breath; states was Rx prednisone, but "it's not helping".  C/o left lateral chest wall pain, worse with deep breaths and cough; in no apparent distress; a&ox4; answers questions appropriately.

## 2011-04-12 NOTE — ED Notes (Signed)
Pt with c/o SOB starting yesterday.  Pt states he was in ED yesterday and went home against advice to be admitted.  Pt states he had worsening of symptoms after leaving yesterday and returns today.  Pt states symptoms are aggravated with activity.

## 2011-04-12 NOTE — Telephone Encounter (Signed)
Spoke with pt. He states having increased SOB x 2 days and also prod cough with white sputum. Fever last night 101.9. States that he went to ED yesterday am and had low sats and they wanted to admit pt but he refused stating that he thought it was not needed, but now thinks he should have stayed. No openings with [facility] today. Please advise, thanks! Allergies  Allergen Reactions  . Penicillins Rash

## 2011-04-12 NOTE — ED Notes (Signed)
RT at bedside to administer albuterol neb

## 2011-04-13 ENCOUNTER — Encounter: Payer: Self-pay | Admitting: *Deleted

## 2011-04-13 ENCOUNTER — Ambulatory Visit (INDEPENDENT_AMBULATORY_CARE_PROVIDER_SITE_OTHER): Payer: 59

## 2011-04-13 DIAGNOSIS — J309 Allergic rhinitis, unspecified: Secondary | ICD-10-CM

## 2011-04-13 NOTE — ED Provider Notes (Signed)
Medical screening examination/treatment/procedure(s) were performed by non-physician practitioner and as supervising physician I was immediately available for consultation/collaboration.  Flint Melter, MD 04/13/11 [phone]

## 2011-04-16 ENCOUNTER — Ambulatory Visit (INDEPENDENT_AMBULATORY_CARE_PROVIDER_SITE_OTHER): Payer: 59

## 2011-04-16 DIAGNOSIS — J309 Allergic rhinitis, unspecified: Secondary | ICD-10-CM

## 2011-04-20 ENCOUNTER — Ambulatory Visit (INDEPENDENT_AMBULATORY_CARE_PROVIDER_SITE_OTHER): Payer: 59

## 2011-04-20 ENCOUNTER — Telehealth: Payer: Self-pay | Admitting: Internal Medicine

## 2011-04-20 ENCOUNTER — Ambulatory Visit (INDEPENDENT_AMBULATORY_CARE_PROVIDER_SITE_OTHER): Payer: 59 | Admitting: Internal Medicine

## 2011-04-20 ENCOUNTER — Encounter: Payer: Self-pay | Admitting: Internal Medicine

## 2011-04-20 ENCOUNTER — Other Ambulatory Visit: Payer: 59

## 2011-04-20 VITALS — BP 122/88 | HR 86 | Ht 75.0 in | Wt 190.0 lb

## 2011-04-20 DIAGNOSIS — J309 Allergic rhinitis, unspecified: Secondary | ICD-10-CM

## 2011-04-20 DIAGNOSIS — J45909 Unspecified asthma, uncomplicated: Secondary | ICD-10-CM

## 2011-04-20 DIAGNOSIS — J4550 Severe persistent asthma, uncomplicated: Secondary | ICD-10-CM

## 2011-04-20 MED ORDER — PREDNISONE 10 MG PO TABS: ORAL_TABLET | ORAL | Status: DC

## 2011-04-20 NOTE — Progress Notes (Signed)
Patient ID: Drew Sandoval, male    DOB: 13-May-1975, 35 y.o.   MRN: [id number]  HPI 11/04/10- 35 yo former smoker followed for severe asthma, chronic sinusitis, hype-reosinophilia and hyper IgE Sent to me by Dr Shelle Iron for allergy evaluation. 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 Allergy profile- 08/11/10 IgE 1,747 with broad specific elevation Last here- August 31, 2010.- note reviewed. Since last here has been in hospital again, getting out 2 weeks ago. Feels well today.  Comes today as planned for allergy skin testing Skin test: Strongly positive especially grass, tree, dust mite  01/04/11- 35 yo former smoker followed for severe asthma, chronic sinusitis, hyper-eosinophilia and hyper IgE.  wife is with him Sent to me by Dr Shelle Iron for allergy evaluation. 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 Allergy vaccine was started 11/09/2010 and is building without problems He was still on prednisone when he saw Dr Shelle Iron recently and at that time he seemed improved after a recent exacerbation- 2 weeks ago. About 2 weeks after their visit he began coughing again. He hasn't noted any unusual exposure at work, or any sense that he had a cold.  His last IgE was 1747.  He doesn't feel "sick"- just tight and wheezy.  02/18/11-  35 yo former smoker followed for severe asthma, chronic sinusitis, hyper-eosinophilia and hyper IgE.  wife is with him Cooler weather is more comfortable for him. He continues to build allergy vaccine, now at 1:500, without problems. Some days continued to be better than others. 2 days ago thought his eyes were starting to itch and took Benadryl. We discussed comparison antihistamines. Not much sneezing. He has not needed his rescue inhaler in a long time but continues prednisone 20 mg daily. We educated on prednisone again.  04/20/11- 35  yo former smoker followed for severe asthma, chronic sinusitis, hyper-eosinophilia and hyper IgE.  wife is with him Went to ER 3 days ago- got neb and prednisone 60 mg x1. He finished our last taper yesterday. Has had flu shot. Bending over, long walks or climbing stairs, and long work shifts all make him short of breath with wheezing. He worked last night and now feels "65%". He does not think he has an acute cold or infection. He was last really well about 5 days ago. He denies heartburn. He continues building allergy vaccine now at 1:50, without problems. IgE level has been out of range for Xolair.  Review of Systems- see HPI Constitutional:   No weight loss, night sweats,  Fevers, chills, fatigue, lassitude. HEENT:   No headaches,  Difficulty swallowing,  Tooth/dental problems,  Sore throat,                No sneezing, ear ache,                                + some sniffing, throat itches CV:  No chest pain, orthopnea, PND, swelling in lower extremities, anasarca, dizziness, palpitations GI  No heartburn, indigestion, abdominal pain, nausea, vomiting, diarrhea, change in bowel habits, loss of appetite Resp: Today-  .  No coughing up of blood.  No change in color of mucus.   Skin: no rash or lesions. GU: no dysuria, change in color of urine, no urgency  or frequency.  No flank pain. MS:  No joint pain or swelling.  No decreased range of motion.  No back pain. Psych:  No change in mood or affect. No depression or anxiety.  No memory loss.      Objective:   Physical Exam General- Alert, Oriented, Affect-appropriate, Distress- none acute; not toxic but subdued. Tall, trim Skin- rash-none, lesions- none, excoriation- none Lymphadenopathy- none Head- atraumatic            Eyes- Gross vision intact, PERRLA, conjunctivae clear secretions            Ears- Hearing, canals normal            Nose- Clear, no-Septal dev, mucus, polyps, erosion, perforation             Throat- Mallampati II , mucosa  clear , drainage- none, tonsils- atrophic Neck- flexible , trachea midline, no stridor , thyroid nl, carotid no bruit Chest - symmetrical excursion , unlabored           Heart/CV- RRR , no murmur , no gallop  , no rub, nl s1 s2                           - JVD- none , edema- none, stasis changes- none, varices- none           Lung-  crackles right lower lobe, no wheeze, unlabored, cough- none , dullness-none, rub- none           Chest wall-  Abd- tender-no, distended-no, bowel sounds-present, HSM- no Br/ Gen/ Rectal- Not done, not indicated Extrem- cyanosis- none, clubbing, none, atrophy- none, strength- nl Neuro- grossly intact to observation

## 2011-04-20 NOTE — Patient Instructions (Signed)
Script sent for prednisone taper to hold  Order lab- IgE dx allergic asthma

## 2011-04-20 NOTE — Telephone Encounter (Signed)
Pharmacist advised pt has already picked up Prednisone.

## 2011-04-21 ENCOUNTER — Telehealth: Payer: Self-pay | Admitting: Internal Medicine

## 2011-04-21 LAB — IGE: IgE (Immunoglobulin E), Serum: 6648.9 IU/mL — ABNORMAL HIGH (ref 0.0–180.0)

## 2011-04-21 MED ORDER — PREDNISONE 5 MG PO TABS: ORAL_TABLET | ORAL | Status: DC

## 2011-04-21 NOTE — Telephone Encounter (Signed)
Per CY- He was just intending to let him hold a taper Rx. If they think he needs to stay on maintenance prednisone, we can call in Prednisone 5mg  #100 take 2 daily or as directed no refills.

## 2011-04-21 NOTE — Telephone Encounter (Signed)
LMOMTCB x 1 

## 2011-04-21 NOTE — Telephone Encounter (Signed)
Pt is wanting rx for Prednisone called to his pharmacy to take daily. He was given instructions for use.

## 2011-04-21 NOTE — Telephone Encounter (Signed)
Spoke with pt's spouse. She states that they thought that [facility] was going to send prescription to pharmacy electronically for prednisone and also print rx for him to hold. She states that when they went to pharmacy to pick up, no rx was there so he had to fill the rx that he gave him to hold. Wants new rx for prednisone sent to pharm- I am assuming that she means for maintenance dosing, but she sounded very confused. Please advise, thanks!

## 2011-04-23 ENCOUNTER — Ambulatory Visit (INDEPENDENT_AMBULATORY_CARE_PROVIDER_SITE_OTHER): Payer: 59

## 2011-04-23 DIAGNOSIS — J309 Allergic rhinitis, unspecified: Secondary | ICD-10-CM

## 2011-04-24 NOTE — Assessment & Plan Note (Signed)
I remain concerned that there is an exposure in his work place which is aggravating. We will recheck IgE level, hoping for an opportunity to initiate Xolair therapy. Meanwhile allergy vaccine has not reached goal yet and we will continue with that.

## 2011-04-30 ENCOUNTER — Ambulatory Visit (INDEPENDENT_AMBULATORY_CARE_PROVIDER_SITE_OTHER): Payer: 59

## 2011-04-30 DIAGNOSIS — J309 Allergic rhinitis, unspecified: Secondary | ICD-10-CM

## 2011-04-30 NOTE — Progress Notes (Signed)
Quick Note:  Pt aware of results. ______ 

## 2011-05-07 ENCOUNTER — Ambulatory Visit (INDEPENDENT_AMBULATORY_CARE_PROVIDER_SITE_OTHER): Payer: 59

## 2011-05-07 DIAGNOSIS — J309 Allergic rhinitis, unspecified: Secondary | ICD-10-CM

## 2011-05-14 ENCOUNTER — Ambulatory Visit (INDEPENDENT_AMBULATORY_CARE_PROVIDER_SITE_OTHER): Payer: 59

## 2011-05-14 ENCOUNTER — Telehealth: Payer: Self-pay | Admitting: *Deleted

## 2011-05-14 DIAGNOSIS — J309 Allergic rhinitis, unspecified: Secondary | ICD-10-CM

## 2011-05-14 NOTE — Telephone Encounter (Signed)
Noted. Please let us know if we need to do anything else.

## 2011-05-14 NOTE — Telephone Encounter (Signed)
Drew Sandoval had a gold dollar size reaction to his shot last Friday.

## 2011-05-14 NOTE — Telephone Encounter (Signed)
Continuation of first note. He didn't tell me until he came in today for his next shot. It was his lt.arm this time. That was his third dose at 0.5. I backed him down to 0.4(vial b) I asked him to call us and let us know if had any problems this time. He also asked me about redness in his eyes, it's been going on for about a wk.. I gave him your recommendation to try visine allergy eye drops and if that didn't work to call and leave a message for you.

## 2011-05-17 ENCOUNTER — Encounter: Payer: Self-pay | Admitting: Internal Medicine

## 2011-05-25 ENCOUNTER — Ambulatory Visit (INDEPENDENT_AMBULATORY_CARE_PROVIDER_SITE_OTHER): Payer: 59

## 2011-05-25 DIAGNOSIS — J309 Allergic rhinitis, unspecified: Secondary | ICD-10-CM

## 2011-06-04 ENCOUNTER — Ambulatory Visit (INDEPENDENT_AMBULATORY_CARE_PROVIDER_SITE_OTHER): Payer: 59

## 2011-06-04 DIAGNOSIS — J309 Allergic rhinitis, unspecified: Secondary | ICD-10-CM

## 2011-06-11 ENCOUNTER — Ambulatory Visit (INDEPENDENT_AMBULATORY_CARE_PROVIDER_SITE_OTHER): Payer: 59

## 2011-06-11 DIAGNOSIS — J309 Allergic rhinitis, unspecified: Secondary | ICD-10-CM

## 2011-06-13 ENCOUNTER — Other Ambulatory Visit: Payer: Self-pay | Admitting: Internal Medicine

## 2011-06-14 ENCOUNTER — Telehealth: Payer: Self-pay | Admitting: Pulmonary Disease

## 2011-06-14 MED ORDER — MOMETASONE FURO-FORMOTEROL FUM 200-5 MCG/ACT IN AERO: 2.0000 | INHALATION_SPRAY | Freq: Two times a day (BID) | RESPIRATORY_TRACT | Status: DC

## 2011-06-14 NOTE — Telephone Encounter (Signed)
Calling back stating patient works 3rd shift and needs dulera rx called in asap.  [facility] [place]

## 2011-06-14 NOTE — Telephone Encounter (Signed)
I spoke with pt and is aware the rx for [facility]ulera [facility] [place]. Pt needed nothing further

## 2011-06-15 ENCOUNTER — Ambulatory Visit (INDEPENDENT_AMBULATORY_CARE_PROVIDER_SITE_OTHER): Payer: 59

## 2011-06-15 DIAGNOSIS — J309 Allergic rhinitis, unspecified: Secondary | ICD-10-CM

## 2011-06-22 ENCOUNTER — Ambulatory Visit: Payer: 59 | Admitting: Pulmonary Disease

## 2011-06-24 ENCOUNTER — Emergency Department (HOSPITAL_COMMUNITY): Payer: 59

## 2011-06-24 ENCOUNTER — Encounter (HOSPITAL_COMMUNITY): Payer: Self-pay | Admitting: Emergency Medicine

## 2011-06-24 ENCOUNTER — Emergency Department (HOSPITAL_COMMUNITY)
Admission: EM | Admit: 2011-06-24 | Discharge: 2011-06-24 | Disposition: A | Discharge status: Home / Self Care | Payer: 59 | Attending: Emergency Medicine | Admitting: Emergency Medicine

## 2011-06-24 DIAGNOSIS — J45909 Unspecified asthma, uncomplicated: Secondary | ICD-10-CM | POA: Insufficient documentation

## 2011-06-24 DIAGNOSIS — R109 Unspecified abdominal pain: Secondary | ICD-10-CM

## 2011-06-24 DIAGNOSIS — Z87891 Personal history of nicotine dependence: Secondary | ICD-10-CM | POA: Insufficient documentation

## 2011-06-24 LAB — URINALYSIS, ROUTINE W REFLEX MICROSCOPIC
Leukocytes, UA: NEGATIVE
Nitrite: NEGATIVE
Specific Gravity, Urine: 1.01 (ref 1.005–1.030)
Urobilinogen, UA: 0.2 mg/dL (ref 0.0–1.0)
pH: 7 (ref 5.0–8.0)

## 2011-06-24 LAB — URINE MICROSCOPIC-ADD ON

## 2011-06-24 MED ORDER — HYDROCODONE-ACETAMINOPHEN 5-500 MG PO TABS: 1.0000 | ORAL_TABLET | Freq: Four times a day (QID) | ORAL | Status: AC | PRN

## 2011-06-24 MED ORDER — NAPROXEN 500 MG PO TABS: 500.0000 mg | ORAL_TABLET | Freq: Two times a day (BID) | ORAL | Status: DC

## 2011-06-24 MED ORDER — SODIUM CHLORIDE 0.9 % IV BOLUS (SEPSIS)
1000.0000 mL | Freq: Once | INTRAVENOUS | Status: AC
  Administered 2011-06-24: 1000 mL via INTRAVENOUS

## 2011-06-24 MED ORDER — KETOROLAC TROMETHAMINE 30 MG/ML IJ SOLN
30.0000 mg | Freq: Once | INTRAMUSCULAR | Status: AC
  Administered 2011-06-24: 30 mg via INTRAVENOUS
  Filled 2011-06-24: qty 1

## 2011-06-24 NOTE — ED Notes (Signed)
Patient with c/o left flank pain that started Monday. Patient started right flank Monday and moved to left flank yesterday. Patient reports pressure when sitting down. Reports dysuria.

## 2011-06-24 NOTE — Discharge Instructions (Signed)
Flank Pain Flank pain refers to pain that is located on the side of the body between the upper abdomen and the back. It can be caused by many things. CAUSES  Some of the more common causes of flank pain include:  Muscle strain.   Muscle spasms.   A disease of your spine (vertebral disk disease).   A lung infection (pneumonia).   Fluid around your lungs (pulmonary edema).   A kidney infection.   Kidney stones.   A very painful skin rash on only one side of your body (shingles).   Gallbladder disease.  DIAGNOSIS  Blood tests, urine tests, and X-rays may help your caregiver determine what is wrong. TREATMENT  The treatment of pain depends on the cause. Your caregiver will determine what treatment will work best for you. HOME CARE INSTRUCTIONS   Home care will depend on the cause of your pain.   Some medications may help relieve the pain. Take medication for relief of pain as directed by your caregiver.   Tell your caregiver about any changes in your pain.   Follow up with your caregiver.  SEEK IMMEDIATE MEDICAL CARE IF:   Your pain is not controlled with medication.   The pain increases.   You have abdominal pain.   You have shortness of breath.   You have persistent nausea or vomiting.   You have swelling in your abdomen.   You feel faint or pass out.   You have a temperature by mouth above 102 F (38.9 C), not controlled by medicine.  MAKE SURE YOU:   Understand these instructions.   Will watch your condition.   Will get help right away if you are not doing well or get worse.  Document Released: 06/17/2005 Document Revised: 01/06/2011 Document Reviewed: 10/11/2009 [employer] Patient Information 2012 [employer]. 

## 2011-06-24 NOTE — ED Provider Notes (Signed)
History   This chart was scribed for Drew Bailiff, MD scribed by Magnus Sinning. The patient was seen in room APA03/APA03 seen at 8:45.      CSN: [id number]  Arrival date & time 06/24/11  0825   First MD Initiated Contact with Patient 06/24/11 [phone]      Chief Complaint  Patient presents with  . Flank Pain    (Consider location/radiation/quality/duration/timing/severity/associated sxs/prior treatment) HPI Drew Sandoval is a 36 y.o. male who presents to the Emergency Department complaining of constant moderate flank pain, onset 4 days ago, which he believes is associated kidney stones that he claims to have had before. He states he had been taking plenty of fluids. Describes pain as starting at LUQ, but that it has now moved to his right extending to his groin. Pt denies taking any medications or any urinary symptoms.  Has not tried any alleviating measures at home.  Denies cp, sob, n/v, hematuria  Past Medical History  Diagnosis Date  . Asthma   . Seasonal allergies     History reviewed. No pertinent past surgical history.  Family History  Problem Relation Age of Onset  . Emphysema Father     History  Substance Use Topics  . Smoking status: Former Smoker -- 0.5 packs/day for 11.5 years    Types: Cigarettes    Quit date: 04/09/2010  . Smokeless tobacco: Never Used   Comment: started at age 24.  1/2 ppd.    . Alcohol Use: No     Review of Systems  Gastrointestinal: Positive for abdominal pain.  Genitourinary: Negative.   All other systems reviewed and are negative.   Allergies  Penicillins  Home Medications   Current Outpatient Rx  Name Route Sig Dispense Refill  . ALBUTEROL SULFATE HFA 108 (90 BASE) MCG/ACT IN AERS Inhalation Inhale 1-2 puffs into the lungs every 6 (six) hours as needed for wheezing. 1 Inhaler 0  . ALBUTEROL SULFATE (2.5 MG/3ML) 0.083% IN NEBU Nebulization Take 2.5 mg by nebulization every 6 (six) hours as needed. For shortness of breath 75 mL  2  . DIPHENHYDRAMINE HCL 25 MG PO TABS Oral Take 25 mg by mouth at bedtime.      Marland Kitchen EPINEPHRINE 0.3 MG/0.3ML IJ DEVI Subcutaneous Inject 0.3 mLs (0.3 mg total) into the skin once as needed (For severe asthma or allergic reaction). 1 Device 6  . HYDROCODONE-ACETAMINOPHEN 5-500 MG PO TABS Oral Take 1-2 tablets by mouth every 6 (six) hours as needed for pain. 8 tablet 0  . IBUPROFEN 200 MG PO TABS Oral Take 400 mg by mouth every 6 (six) hours as needed. pain    . MOMETASONE FURO-FORMOTEROL FUM 200-5 MCG/ACT IN AERO Inhalation Inhale 2 puffs into the lungs 2 (two) times daily. 1 Inhaler 5  . MONTELUKAST SODIUM 10 MG PO TABS Oral Take 10 mg by mouth at bedtime.      Marland Kitchen NAPROXEN 500 MG PO TABS Oral Take 1 tablet (500 mg total) by mouth 2 (two) times daily. 30 tablet 0  . PREDNISONE 10 MG PO TABS  4 X 2 DAYS, 3 X 2 DAYS, 2 X 2 DAYS, 1 X 2 DAYS  20 tablet 0  . PREDNISONE 5 MG PO TABS  Take 2 tablets by mouth daily or as instructed 100 tablet 0    BP 140/87  Pulse 74  Temp(Src) 98.3 F (36.8 C) (Oral)  Resp 16  Ht 6\' 3"  (1.905 m)  Wt 190 lb (86.183 kg)  BMI 23.75  kg/m2  SpO2 98%  Physical Exam  Nursing note and vitals reviewed. Constitutional: He is oriented to person, place, and time. He appears well-developed and well-nourished. No distress.  HENT:  Head: Normocephalic and atraumatic.  Eyes: EOM are normal. Pupils are equal, round, and reactive to light.  Neck: Neck supple. No tracheal deviation present.  Cardiovascular: Normal rate.   Pulmonary/Chest: Effort normal. No respiratory distress.  Abdominal: Soft. He exhibits no distension. There is no rebound and no guarding. Hernia confirmed negative in the right inguinal area and confirmed negative in the left inguinal area.  Genitourinary: Testes normal and penis normal. Right testis shows no mass, no swelling and no tenderness. Left testis shows no mass, no swelling and no tenderness. Circumcised.  Musculoskeletal: Normal range of motion. He  exhibits no edema.  Neurological: He is alert and oriented to person, place, and time. No sensory deficit.  Skin: Skin is warm and dry.  Psychiatric: He has a normal mood and affect. His behavior is normal.    ED Course  Procedures (including critical care time) DIAGNOSTIC STUDIES: Oxygen Saturation is 98% on room air, normal by my interpretation.    COORDINATION OF CARE: Labs Reviewed  URINALYSIS, ROUTINE W REFLEX MICROSCOPIC - Abnormal; Notable for the following:    Hgb urine dipstick SMALL (*)    All other components within normal limits  URINE MICROSCOPIC-ADD ON   Ct Abdomen Pelvis Wo Contrast  06/24/2011  *RADIOLOGY REPORT*  Clinical Data: Bilateral flank pain since June 21, 2011. History of kidney stones.  CT ABDOMEN AND PELVIS WITHOUT CONTRAST  Technique:  Multidetector CT imaging of the abdomen and pelvis was performed following the standard protocol without intravenous contrast.  Comparison: CT of abdomen and pelvis dated of 06/08/2009.  Findings:  Lung Bases: 6 mm nodule in the lateral aspect of the left lower lobe (image 7 of series 3), unchanged compared to prior study 06/08/2009.  3 mm nodule in the inferior aspect of the right major fissure (image 2 of series 3) and 2 mm nodule in the periphery of the right lower lobe (image 380 of series 3) also unchanged compared to prior exam from 06/08/2009.  Abdomen/Pelvis:  No abnormal urinary tract calcifications.  No signs of hydroureteronephrosis.  Within the anatomic pelvis there are two small calcifications which appears slightly larger than the prior exam from 06/08/2009, but the these are both consistent with phleboliths (the one on the right side is well below the right ureter, the one on the left side is just above the distal left ureter).  The unenhanced appearance of the liver, gallbladder, pancreas, spleen, bilateral adrenal glands and bilateral kidneys is unremarkable.  No ascites or pneumoperitoneum and no pathologic  distension of bowel.  There are a few colonic diverticula, predominately in the region of the sigmoid colon, without surrounding inflammatory changes to suggest acute diverticulitis at this time.  The appendix is normal.  Urinary bladder is unremarkable.  Musculoskeletal: There are no aggressive appearing lytic or blastic lesions noted in the visualized portions of the skeleton.  IMPRESSION:  1.  No acute findings in the abdomen or pelvis to account for the patient's history of flank pain.  Specifically, no abnormal urinary tract calculi. 2.  Multiple tiny pulmonary nodules seen in the lung bases bilaterally.  In retrospect, these are completely unchanged compared to prior examination from 06/08/2009, and given their stability in size and appearance over greater than 2-year time interval these can be considered radiographically benign requiring no further  imaging follow-up. This recommendation follows the consensus statement: Guidelines for Management of Small Pulmonary Nodules Detected on CT Scans:  A Statement from the Fleischner Society as published in Radiology 2005; 237:395-400.  Available online at:  DietDisorder.cz. 3. Mild colonic diverticulosis without findings to suggest acute diverticulitis.  Original Report Authenticated By: Florencia Reasons, M.D.    1. Flank pain       MDM  CT scan with no evidence of kidney stone. There may have been a stone she's passed in the past few days and he has some residuals ureteral spasm. His pain was treated numerous department with Toradol. He received IV fluids. Symptoms have improved. I have no concern about a testicular etiology of his pain as his testicular exam is normal. His urinalysis is unremarkable. He'll be discharged home with pain medication. I personally performed the services described in this documentation, which was scribed in my presence. The recorded information has been reviewed and  considered.         Drew Bailiff, MD 06/24/11 1017

## 2011-06-25 ENCOUNTER — Ambulatory Visit (INDEPENDENT_AMBULATORY_CARE_PROVIDER_SITE_OTHER): Payer: 59

## 2011-06-25 DIAGNOSIS — J309 Allergic rhinitis, unspecified: Secondary | ICD-10-CM

## 2011-07-09 ENCOUNTER — Ambulatory Visit (INDEPENDENT_AMBULATORY_CARE_PROVIDER_SITE_OTHER): Payer: 59

## 2011-07-09 DIAGNOSIS — J309 Allergic rhinitis, unspecified: Secondary | ICD-10-CM

## 2011-07-15 ENCOUNTER — Encounter: Payer: Self-pay | Admitting: Internal Medicine

## 2011-07-19 ENCOUNTER — Ambulatory Visit (INDEPENDENT_AMBULATORY_CARE_PROVIDER_SITE_OTHER): Payer: 59 | Admitting: Internal Medicine

## 2011-07-19 ENCOUNTER — Encounter: Payer: Self-pay | Admitting: Internal Medicine

## 2011-07-19 VITALS — BP 146/80 | HR 110 | Ht 75.0 in | Wt 176.0 lb

## 2011-07-19 DIAGNOSIS — L239 Allergic contact dermatitis, unspecified cause: Secondary | ICD-10-CM

## 2011-07-19 DIAGNOSIS — J4550 Severe persistent asthma, uncomplicated: Secondary | ICD-10-CM

## 2011-07-19 DIAGNOSIS — L259 Unspecified contact dermatitis, unspecified cause: Secondary | ICD-10-CM

## 2011-07-19 DIAGNOSIS — J45909 Unspecified asthma, uncomplicated: Secondary | ICD-10-CM

## 2011-07-19 NOTE — Patient Instructions (Signed)
For the skin rash- eczema- try an otc cortisone cream   I recommend we continue allergy vaccine for now.

## 2011-07-19 NOTE — Progress Notes (Signed)
Patient ID: Drew Sandoval, male    DOB: [birth year], 36 y.o.   MRN: [id number]  HPI 11/04/10- 79 yo former smoker followed for severe asthma, chronic sinusitis, hype-reosinophilia and hyper IgE Sent to me by Dr Shelle Iron for allergy evaluation. 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 Allergy profile- 08/11/10 IgE 1,747 with broad specific elevation Last here- August 31, 2010.- note reviewed. Since last here has been in hospital again, getting out 2 weeks ago. Feels well today.  Comes today as planned for allergy skin testing Skin test: Strongly positive especially grass, tree, dust mite  01/04/11- 42 yo former smoker followed for severe asthma, chronic sinusitis, hyper-eosinophilia and hyper IgE.  wife is with him Sent to me by Dr Shelle Iron for allergy evaluation. 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 Allergy vaccine was started 11/09/2010 and is building without problems He was still on prednisone when he saw Dr Shelle Iron recently and at that time he seemed improved after a recent exacerbation- 2 weeks ago. About 2 weeks after their visit he began coughing again. He hasn't noted any unusual exposure at work, or any sense that he had a cold.  His last IgE was 1747.  He doesn't feel "sick"- just tight and wheezy.  02/18/11-  6 yo former smoker followed for severe asthma, chronic sinusitis, hyper-eosinophilia and hyper IgE.  wife is with him Cooler weather is more comfortable for him. He continues to build allergy vaccine, now at 1:500, without problems. Some days continued to be better than others. 2 days ago thought his eyes were starting to itch and took Benadryl. We discussed comparison antihistamines. Not much sneezing. He has not needed his rescue inhaler in a long time but continues prednisone 20 mg daily. We educated on prednisone again.  04/20/11- 83  yo former smoker followed for severe asthma, chronic sinusitis, hyper-eosinophilia and hyper IgE.  wife is with him Went to ER 3 days ago- got neb and prednisone 60 mg x1. He finished our last taper yesterday. Has had flu shot. Bending over, long walks or climbing stairs, and long work shifts all make him short of breath with wheezing. He worked last night and now feels "65%". He does not think he has an acute cold or infection. He was last really well about 5 days ago. He denies heartburn. He continues building allergy vaccine now at 1:50, without problems. IgE level has been out of range for Xolair.  07/19/11- 64 yo former smoker followed for severe asthma, chronic sinusitis, hyper-eosinophilia and hyper IgE.   IgE level has been out of range for Xolair. Separated from wife have now living with his mother. Asthma control is "a whole lot better". He has not missed work. Did have to go to emergency room for kidney stone. Questions allergy vaccine causing "dark spots" on his neck. Using prednisone only occasionally. No routine wheeze, does short of breath at times.  Review of Systems- see HPI Constitutional:   No weight loss, night sweats,  Fevers, chills, fatigue, lassitude. HEENT:   No headaches,  Difficulty swallowing,  Tooth/dental problems,  Sore throat,                No sneezing, ear ache,  + some sniffing, throat itches CV:  No chest pain, orthopnea, PND, swelling in lower extremities, anasarca, dizziness, palpitations GI  No heartburn, indigestion, abdominal pain, nausea, vomiting,  Resp: Today-  .  No coughing up of blood.  No change in color of mucus.   Skin: "dark spots". GU: no dysuria,  MS:  No joint pain or swelling.   Psych:  No change in mood or affect. No depression or anxiety.  No memory loss.      Objective:   Physical Exam General- Alert, Oriented, Affect-appropriate, Distress- none acute; not toxic but subdued. Tall, trim Skin- areas of mildly hyperpigmented eczema on  the sides of his neck Lymphadenopathy- none Head- atraumatic            Eyes- Gross vision intact, PERRLA, conjunctivae clear secretions            Ears- Hearing, canals normal            Nose- Clear, no-Septal dev, mucus, polyps, erosion, perforation             Throat- Mallampati II , mucosa clear , drainage- none, tonsils- atrophic Neck- flexible , trachea midline, no stridor , thyroid nl, carotid no bruit Chest - symmetrical excursion , unlabored           Heart/CV- RRR , no murmur , no gallop  , no rub, nl s1 s2                           - JVD- none , edema- none, stasis changes- none, varices- none           Lung-  clear, no wheeze, unlabored, cough- none , dullness-none, rub- none           Chest wall-  Abd-  Br/ Gen/ Rectal- Not done, not indicated Extrem- cyanosis- none, clubbing, none, atrophy- none, strength- nl Neuro- grossly intact to observation

## 2011-07-22 DIAGNOSIS — L239 Allergic contact dermatitis, unspecified cause: Secondary | ICD-10-CM

## 2011-07-22 HISTORY — DX: Allergic contact dermatitis, unspecified cause: L23.9

## 2011-07-22 NOTE — Assessment & Plan Note (Signed)
Areas of mild eczema on the sides of his neck. I don't believe allergy vaccine is causing this. We discussed management options. Recommend OTC topical steroid.

## 2011-07-22 NOTE — Assessment & Plan Note (Signed)
She credits allergy vaccine for much better asthma control. Plan-continue present treatment.

## 2011-08-03 ENCOUNTER — Telehealth: Payer: Self-pay | Admitting: Internal Medicine

## 2011-08-03 ENCOUNTER — Ambulatory Visit (INDEPENDENT_AMBULATORY_CARE_PROVIDER_SITE_OTHER): Payer: 59

## 2011-08-03 DIAGNOSIS — J309 Allergic rhinitis, unspecified: Secondary | ICD-10-CM

## 2011-08-03 MED ORDER — ALBUTEROL SULFATE HFA 108 (90 BASE) MCG/ACT IN AERS: 1.0000 | INHALATION_SPRAY | Freq: Four times a day (QID) | RESPIRATORY_TRACT | Status: DC | PRN

## 2011-08-03 NOTE — Telephone Encounter (Signed)
Sent in refill to patient's pharmacy and notified patient

## 2011-08-13 ENCOUNTER — Ambulatory Visit (INDEPENDENT_AMBULATORY_CARE_PROVIDER_SITE_OTHER): Payer: 59

## 2011-08-13 DIAGNOSIS — J309 Allergic rhinitis, unspecified: Secondary | ICD-10-CM

## 2011-08-18 ENCOUNTER — Encounter (HOSPITAL_COMMUNITY): Payer: Self-pay | Admitting: Emergency Medicine

## 2011-08-18 ENCOUNTER — Emergency Department (HOSPITAL_COMMUNITY)
Admission: EM | Admit: 2011-08-18 | Discharge: 2011-08-18 | Disposition: A | Discharge status: Home / Self Care | Payer: 59 | Attending: Emergency Medicine | Admitting: Emergency Medicine

## 2011-08-18 ENCOUNTER — Emergency Department (HOSPITAL_COMMUNITY): Payer: 59

## 2011-08-18 DIAGNOSIS — Z87891 Personal history of nicotine dependence: Secondary | ICD-10-CM | POA: Insufficient documentation

## 2011-08-18 DIAGNOSIS — J45901 Unspecified asthma with (acute) exacerbation: Secondary | ICD-10-CM

## 2011-08-18 DIAGNOSIS — J45909 Unspecified asthma, uncomplicated: Secondary | ICD-10-CM | POA: Insufficient documentation

## 2011-08-18 MED ORDER — ALBUTEROL SULFATE (5 MG/ML) 0.5% IN NEBU
2.5000 mg | INHALATION_SOLUTION | Freq: Once | RESPIRATORY_TRACT | Status: AC
  Administered 2011-08-18: 2.5 mg via RESPIRATORY_TRACT
  Filled 2011-08-18: qty 0.5

## 2011-08-18 MED ORDER — IPRATROPIUM BROMIDE 0.02 % IN SOLN
0.5000 mg | Freq: Once | RESPIRATORY_TRACT | Status: AC
  Administered 2011-08-18: 0.5 mg via RESPIRATORY_TRACT
  Filled 2011-08-18: qty 2.5

## 2011-08-18 MED ORDER — ALBUTEROL SULFATE (5 MG/ML) 0.5% IN NEBU
5.0000 mg | INHALATION_SOLUTION | Freq: Once | RESPIRATORY_TRACT | Status: AC
  Administered 2011-08-18: 5 mg via RESPIRATORY_TRACT
  Filled 2011-08-18: qty 1

## 2011-08-18 MED ORDER — METHYLPREDNISOLONE SODIUM SUCC 125 MG IJ SOLR
125.0000 mg | Freq: Once | INTRAMUSCULAR | Status: AC
  Administered 2011-08-18: 125 mg via INTRAVENOUS
  Filled 2011-08-18: qty 2

## 2011-08-18 MED ORDER — PREDNISONE 10 MG PO TABS: 40.0000 mg | ORAL_TABLET | Freq: Every day | ORAL | Status: DC

## 2011-08-18 MED ORDER — SODIUM CHLORIDE 0.9 % IV SOLN
Freq: Once | INTRAVENOUS | Status: AC
  Administered 2011-08-18: 1000 mL via INTRAVENOUS

## 2011-08-18 NOTE — ED Notes (Signed)
Pt c/o sob since last night.  

## 2011-08-18 NOTE — Discharge Instructions (Signed)
Asthma, Adult Asthma is a disease of the lungs and can make it hard to breathe. Asthma cannot be cured, but medicine can help control it. Asthma may be started (triggered) by:  Pollen.   Dust.   Animal skin flakes (dander).   Molds.   Foods.   Respiratory infections (colds, flu).   Smoke.   Exercise.   Stress.   Other things that cause allergic reactions or allergies (allergens).  HOME CARE   Talk to your doctor about how to manage your attacks at home. This may include:   Using a tool called a peak flow meter.   Having medicine ready to stop the attack.   Take all medicine as told by your doctor.   Wash bed sheets and blankets every week in hot water and put them in the dryer.   Drink enough fluids to keep your pee (urine) clear or pale yellow.   Always be ready to get emergency help. Write down the phone number for your doctor. Keep it where you can easily find it.   Talk about exercise routines with your doctor.   If animal dander is causing your asthma, you may need to find a new home for your pet(s).  GET HELP RIGHT AWAY IF:   You have muscle aches.   You cough more.   You have chest pain.   You have thick spit (sputum) that changes to yellow, green, gray, or bloody.   Medicine does not stop your wheezing.   You have problems breathing.   You have a fever.   Your medicine causes:   A rash.   Itching.   Puffiness (swelling).   Breathing problems.  MAKE SURE YOU:   Understand these instructions.   Will watch your condition.   Will get help right away if you are not doing well or get worse.  Document Released: 10/13/2007 Document Revised: 04/15/2011 Document Reviewed: 03/06/2008 [facility] Patient Information 2012 [place], [place].  returnfor any new or worse breathing problems. Take prednisone as directed until completed. Followup with your primary care Dr. As scheduled. Use albuterol inhaler every 6 hours 2 puffs for the next week and then  as usually scheduled.

## 2011-08-18 NOTE — ED Notes (Signed)
Prior to breathing treatment pt's breath sounds are more diminished.   Breathing treatment administered by respiratory therapy, edp notified and IV steroids administered as well.

## 2011-08-18 NOTE — ED Provider Notes (Addendum)
History   This chart was scribed for Drew Jakes, MD by Brooks Sailors. The patient was seen in room APA06/APA06. Patient's care was started at 0810.   CSN: [id number]  Arrival date & time 08/18/11  0810   First MD Initiated Contact with Patient 08/18/11 0818      Chief Complaint  Patient presents with  . Shortness of Breath    (Consider location/radiation/quality/duration/timing/severity/associated sxs/prior treatment) HPI  Drew Sandoval is a 36 y.o. male who presents to the Emergency Department complaining of constant severe SOB onset last night and persistent since. Patient has history of asthma and uses albuterol inhaler to relieve SOB. Last use of inhaler was last night. Previously used but not currently on prednisone.     Past Medical History  Diagnosis Date  . Asthma   . Seasonal allergies     History reviewed. No pertinent past surgical history.  Family History  Problem Relation Age of Onset  . Emphysema Father     History  Substance Use Topics  . Smoking status: Former Smoker -- 0.5 packs/day for 11.5 years    Types: Cigarettes    Quit date: 04/09/2010  . Smokeless tobacco: Never Used   Comment: started at age 25.  1/2 ppd.    . Alcohol Use: No      Review of Systems  Constitutional: Negative for fever.  HENT: Negative for neck pain.   Respiratory: Positive for cough (dry) and shortness of breath.   Gastrointestinal: Negative for nausea, vomiting, abdominal pain and diarrhea.  Genitourinary: Negative for dysuria.  Musculoskeletal: Negative for back pain and joint swelling.  Skin: Negative for rash.  Neurological: Positive for headaches.  Hematological: Does not bruise/bleed easily.  All other systems reviewed and are negative.    Allergies  Penicillins  Home Medications   Current Outpatient Rx  Name Route Sig Dispense Refill  . ALBUTEROL SULFATE HFA 108 (90 BASE) MCG/ACT IN AERS Inhalation Inhale 1-2 puffs into the lungs every 6  (six) hours as needed for wheezing. 1 Inhaler 5  . ALBUTEROL SULFATE (2.5 MG/3ML) 0.083% IN NEBU Nebulization Take 2.5 mg by nebulization every 6 (six) hours as needed. For shortness of breath 75 mL 2  . DIPHENHYDRAMINE HCL 25 MG PO TABS Oral Take 25 mg by mouth at bedtime.      Marland Kitchen EPINEPHRINE 0.3 MG/0.3ML IJ DEVI Subcutaneous Inject 0.3 mLs (0.3 mg total) into the skin once as needed (For severe asthma or allergic reaction). 1 Device 6  . IBUPROFEN 200 MG PO TABS Oral Take 400 mg by mouth every 6 (six) hours as needed. pain    . MOMETASONE FURO-FORMOTEROL FUM 200-5 MCG/ACT IN AERO Inhalation Inhale 2 puffs into the lungs 2 (two) times daily. 1 Inhaler 5  . MONTELUKAST SODIUM 10 MG PO TABS Oral Take 10 mg by mouth at bedtime.      Marland Kitchen NAPROXEN 500 MG PO TABS Oral Take 1 tablet (500 mg total) by mouth 2 (two) times daily. 30 tablet 0  . PREDNISONE 10 MG PO TABS  4 X 2 DAYS, 3 X 2 DAYS, 2 X 2 DAYS, 1 X 2 DAYS  20 tablet 0  . PREDNISONE 5 MG PO TABS  Take 2 tablets by mouth daily or as instructed 100 tablet 0    BP 143/84  Pulse 82  Temp(Src) 97.6 F (36.4 C) (Oral)  Resp 24  Ht 6\' 3"  (1.905 m)  Wt 174 lb (78.926 kg)  BMI 21.75 kg/m2  SpO2 94%  Physical Exam  Nursing note and vitals reviewed. Constitutional: He is oriented to person, place, and time. He appears well-developed and well-nourished. No distress.  HENT:  Head: Normocephalic and atraumatic.  Mouth/Throat: Oropharynx is clear and moist.  Eyes: EOM are normal. Pupils are equal, round, and reactive to light.  Neck: Neck supple. No tracheal deviation present.  Cardiovascular: Normal rate.  Exam reveals no gallop and no friction rub.   No murmur heard. Pulmonary/Chest: He has wheezes (bilateral).  Abdominal: Soft. Bowel sounds are normal. He exhibits no distension. There is no tenderness.  Musculoskeletal: Normal range of motion. He exhibits no edema.  Lymphadenopathy:    He has no cervical adenopathy.  Neurological: He is alert  and oriented to person, place, and time. No sensory deficit.  Skin: Skin is warm and dry.  Psychiatric: He has a normal mood and affect. His behavior is normal.    ED Course  Procedures (including critical care time) DIAGNOSTIC STUDIES:   COORDINATION OF CARE: 8:40AM- Patient informed of current plan for treatment and evaluation and agrees with plan at this time.  10:00AM- Patient stats he feels much better after albuterol treatment. Still has wheezes. Ordering another albuterol treatment.  11:14AM- Patient feeling cleared up and says he would be comfortable going home on own. No wheezing at this time.    Labs Reviewed - No data to display No results found.   No diagnosis found. Results for orders placed during the hospital encounter of 06/24/11  URINALYSIS, ROUTINE W REFLEX MICROSCOPIC      Component Value Range   Color, Urine YELLOW  YELLOW    APPearance CLEAR  CLEAR    Specific Gravity, Urine 1.010  1.005 - 1.030    pH 7.0  5.0 - 8.0    Glucose, UA NEGATIVE  NEGATIVE (mg/dL)   Hgb urine dipstick SMALL (*) NEGATIVE    Bilirubin Urine NEGATIVE  NEGATIVE    Ketones, ur NEGATIVE  NEGATIVE (mg/dL)   Protein, ur NEGATIVE  NEGATIVE (mg/dL)   Urobilinogen, UA 0.2  0.0 - 1.0 (mg/dL)   Nitrite NEGATIVE  NEGATIVE    Leukocytes, UA NEGATIVE  NEGATIVE   URINE MICROSCOPIC-ADD ON      Component Value Range   RBC / HPF 7-10  <3 (RBC/hpf)      MDM  Patient with history of severe asthma in the past. Presented today with bronchospasm room air oxygen saturations was always good 96-97% on room air lowest recorded was 94%. Patient received albuterol Atrovent nebulizers x2 for mild wheezing in both lung fields. After the second nebulizer all wheezing had resolved patient feels much better. Chest x-ray negative no evidence of pneumothorax or pneumonia. Patient also received IV Solu-Medrol and 25 mg all in the emergency department in case he deteriorated into more severe bronchospasm as is done  in the past. Patient clinically stable now patient also feels very comfortable with going home states that he feels well enough to go home. We will continue prednisone and have him use his albuterol inhaler and and/or nebulizer every 6 hours for the next week.      I personally performed the services described in this documentation, which was scribed in my presence. The recorded information has been reviewed and considered.     Drew Jakes, MD 08/18/11 1125  Drew Jakes, MD 08/18/11 [phone]

## 2011-08-20 ENCOUNTER — Ambulatory Visit (INDEPENDENT_AMBULATORY_CARE_PROVIDER_SITE_OTHER): Payer: 59

## 2011-08-20 ENCOUNTER — Telehealth: Payer: Self-pay | Admitting: Internal Medicine

## 2011-08-20 DIAGNOSIS — J309 Allergic rhinitis, unspecified: Secondary | ICD-10-CM

## 2011-08-23 ENCOUNTER — Ambulatory Visit (INDEPENDENT_AMBULATORY_CARE_PROVIDER_SITE_OTHER): Payer: 59

## 2011-08-23 DIAGNOSIS — J309 Allergic rhinitis, unspecified: Secondary | ICD-10-CM

## 2011-08-23 NOTE — Telephone Encounter (Signed)
Pt. had not been in since 08-13-2011, just called to check on him. Pt. came in Fri. had not had any problems so I continued with build-up.

## 2011-08-23 NOTE — Telephone Encounter (Signed)
Drew Sandoval, please advise if you attempted to reach this patient and if you did please document and close encounter. If not please call patient back. Thanks.

## 2011-08-24 ENCOUNTER — Ambulatory Visit (INDEPENDENT_AMBULATORY_CARE_PROVIDER_SITE_OTHER): Payer: 59

## 2011-08-24 DIAGNOSIS — J309 Allergic rhinitis, unspecified: Secondary | ICD-10-CM

## 2011-08-27 ENCOUNTER — Ambulatory Visit (INDEPENDENT_AMBULATORY_CARE_PROVIDER_SITE_OTHER): Payer: 59

## 2011-08-27 DIAGNOSIS — J309 Allergic rhinitis, unspecified: Secondary | ICD-10-CM

## 2011-08-31 ENCOUNTER — Ambulatory Visit (INDEPENDENT_AMBULATORY_CARE_PROVIDER_SITE_OTHER): Payer: 59

## 2011-08-31 DIAGNOSIS — J309 Allergic rhinitis, unspecified: Secondary | ICD-10-CM

## 2011-09-07 ENCOUNTER — Encounter (HOSPITAL_COMMUNITY): Payer: Self-pay

## 2011-09-07 ENCOUNTER — Emergency Department (HOSPITAL_COMMUNITY): Payer: 59

## 2011-09-07 ENCOUNTER — Emergency Department (HOSPITAL_COMMUNITY)
Admission: EM | Admit: 2011-09-07 | Discharge: 2011-09-07 | Disposition: A | Discharge status: Home / Self Care | Payer: 59 | Attending: Emergency Medicine | Admitting: Emergency Medicine

## 2011-09-07 DIAGNOSIS — Z87891 Personal history of nicotine dependence: Secondary | ICD-10-CM | POA: Insufficient documentation

## 2011-09-07 DIAGNOSIS — J45901 Unspecified asthma with (acute) exacerbation: Secondary | ICD-10-CM | POA: Insufficient documentation

## 2011-09-07 MED ORDER — PREDNISONE 20 MG PO TABS: 60.0000 mg | ORAL_TABLET | Freq: Every day | ORAL | Status: DC

## 2011-09-07 MED ORDER — ALBUTEROL SULFATE (5 MG/ML) 0.5% IN NEBU
2.5000 mg | INHALATION_SOLUTION | Freq: Once | RESPIRATORY_TRACT | Status: AC
  Administered 2011-09-07: 2.5 mg via RESPIRATORY_TRACT
  Filled 2011-09-07: qty 0.5

## 2011-09-07 MED ORDER — IPRATROPIUM BROMIDE 0.02 % IN SOLN
0.5000 mg | Freq: Once | RESPIRATORY_TRACT | Status: AC
  Administered 2011-09-07: 0.5 mg via RESPIRATORY_TRACT
  Filled 2011-09-07: qty 2.5

## 2011-09-07 MED ORDER — PREDNISONE 20 MG PO TABS
60.0000 mg | ORAL_TABLET | Freq: Once | ORAL | Status: AC
  Administered 2011-09-07: 60 mg via ORAL
  Filled 2011-09-07: qty 3

## 2011-09-07 NOTE — Discharge Instructions (Signed)
Followup with either your primary care doctor or your lung Dr. to set up an appropriate taper of your prednisone dose.  Asthma, Adult Asthma is caused by narrowing of the air passages in the lungs. It may be triggered by pollen, dust, animal dander, molds, some foods, respiratory infections, exposure to smoke, exercise, emotional stress or other allergens (things that cause allergic reactions or allergies). Repeat attacks are common. HOME CARE INSTRUCTIONS   Use prescription medications as ordered by your caregiver.   Avoid pollen, dust, animal dander, molds, smoke and other things that cause attacks at home and at work.   You may have fewer attacks if you decrease dust in your home. Electrostatic air cleaners may help.   It may help to replace your pillows or mattress with materials less likely to cause allergies.   Talk to your caregiver about an action plan for managing asthma attacks at home, including, the use of a peak flow meter which measures the severity of your asthma attack. An action plan can help minimize or stop the attack without having to seek medical care.   If you are not on a fluid restriction, drink 8 to 10 glasses of water each day.   Always have a plan prepared for seeking medical attention, including, calling your physician, accessing local emergency care, and calling 911 (in the [place] for a severe attack.   Discuss possible exercise routines with your caregiver.   If animal dander is the cause of asthma, you may need to get rid of pets.  SEEK MEDICAL CARE IF:   You have wheezing and shortness of breath even if taking medicine to prevent attacks.   You have muscle aches, chest pain or thickening of sputum.   Your sputum changes from clear or white to yellow, green, gray, or bloody.   You have any problems that may be related to the medicine you are taking (such as a rash, itching, swelling or trouble breathing).  SEEK IMMEDIATE MEDICAL CARE IF:   Your usual  medicines do not stop your wheezing or there is increased coughing and/or shortness of breath.   You have increased difficulty breathing.   You have a fever.  MAKE SURE YOU:   Understand these instructions.   Will watch your condition.   Will get help right away if you are not doing well or get worse.  Document Released: 04/26/2005 Document Revised: 04/15/2011 Document Reviewed: 12/13/2007 [employer] Patient Information 2012 [place], [place].  Prednisone tablets What is this medicine? PREDNISONE (PRED ni sone) is a corticosteroid. It is commonly used to treat inflammation of the skin, joints, lungs, and other organs. Common conditions treated include asthma, allergies, and arthritis. It is also used for other conditions, such as blood disorders and diseases of the adrenal glands. This medicine may be used for other purposes; ask your health care provider or pharmacist if you have questions. What should I tell my health care provider before I take this medicine? They need to know if you have any of these conditions: -Cushing's syndrome -diabetes -glaucoma -heart disease -high blood pressure -infection (especially a virus infection such as chickenpox, cold sores, or herpes) -kidney disease -liver disease -mental illness -myasthenia gravis -osteoporosis -seizures -stomach or intestine problems -thyroid disease -an unusual or allergic reaction to lactose, prednisone, other medicines, foods, dyes, or preservatives -pregnant or trying to get pregnant -breast-feeding How should I use this medicine? Take this medicine by mouth with a glass of water. Follow the directions on the prescription label.  Take this medicine with food. If you are taking this medicine once a day, take it in the morning. Do not take more medicine than you are told to take. Do not suddenly stop taking your medicine because you may develop a severe reaction. Your doctor will tell you how much medicine to take. If your  doctor wants you to stop the medicine, the dose may be slowly lowered over time to avoid any side effects. Talk to your pediatrician regarding the use of this medicine in children. Special care may be needed. Overdosage: If you think you have taken too much of this medicine contact a poison control center or emergency room at once. NOTE: This medicine is only for you. Do not share this medicine with others. What if I miss a dose? If you miss a dose, take it as soon as you can. If it is almost time for your next dose, talk to your doctor or health care professional. You may need to miss a dose or take an extra dose. Do not take double or extra doses without advice. What may interact with this medicine? Do not take this medicine with any of the following medications: -metyrapone -mifepristone This medicine may also interact with the following medications: -aminoglutethimide -amphotericin B -aspirin and aspirin-like medicines -barbiturates -certain medicines for diabetes, like glipizide or glyburide -cholestyramine -cholinesterase inhibitors -cyclosporine -digoxin -diuretics -ephedrine -male hormones, like estrogens and birth control pills -isoniazid -ketoconazole -NSAIDS, medicines for pain and inflammation, like ibuprofen or naproxen -phenytoin -rifampin -toxoids -vaccines -warfarin This list may not describe all possible interactions. Give your health care provider a list of all the medicines, herbs, non-prescription drugs, or dietary supplements you use. Also tell them if you smoke, drink alcohol, or use illegal drugs. Some items may interact with your medicine. What should I watch for while using this medicine? Visit your doctor or health care professional for regular checks on your progress. If you are taking this medicine over a prolonged period, carry an identification card with your name and address, the type and dose of your medicine, and your doctor's name and  address. This medicine may increase your risk of getting an infection. Tell your doctor or health care professional if you are around anyone with measles or chickenpox, or if you develop sores or blisters that do not heal properly. If you are going to have surgery, tell your doctor or health care professional that you have taken this medicine within the last twelve months. Ask your doctor or health care professional about your diet. You may need to lower the amount of salt you eat. This medicine may affect blood sugar levels. If you have diabetes, check with your doctor or health care professional before you change your diet or the dose of your diabetic medicine. What side effects may I notice from receiving this medicine? Side effects that you should report to your doctor or health care professional as soon as possible: -allergic reactions like skin rash, itching or hives, swelling of the face, lips, or tongue -changes in emotions or moods -changes in vision -depressed mood -eye pain -fever or chills, cough, sore throat, pain or difficulty passing urine -increased thirst -swelling of ankles, feet Side effects that usually do not require medical attention (report to your doctor or health care professional if they continue or are bothersome): -confusion, excitement, restlessness -headache -nausea, vomiting -skin problems, acne, thin and shiny skin -trouble sleeping -weight gain This list may not describe all possible side effects. Call your  doctor for medical advice about side effects. You may report side effects to FDA at 1-800-FDA-1088. Where should I keep my medicine? Keep out of the reach of children. Store at room temperature between 15 and 30 degrees C (59 and 86 degrees F). Protect from light. Keep container tightly closed. Throw away any unused medicine after the expiration date. NOTE: This sheet is a summary. It may not cover all possible information. If you have questions about  this medicine, talk to your doctor, pharmacist, or health care provider.  2012, [employer]. (12/10/2010 10:57:14 AM)

## 2011-09-07 NOTE — ED Notes (Signed)
"  my asthma is acting up", out of meds and no money to refill, has been having trouble breathing since sunday

## 2011-09-07 NOTE — ED Provider Notes (Signed)
History   This chart was scribed for [name], MD by Sofie Rower. The patient was seen in room APA07/APA07 and the patient's care was started at 8:25 AM     CSN: [id number]  Arrival date & time 09/07/11  0809   First MD Initiated Contact with Patient 09/07/11 0820      Chief Complaint  Patient presents with  . Asthma    (Consider location/radiation/quality/duration/timing/severity/associated sxs/prior treatment) HPI  Drew Sandoval is a 36 y.o. male who presents to the Emergency Department complaining of moderate, episodic asthma onset two days ago with associated symptoms of difficulty breathing, shortness of breath, wheezing. The pt states "my asthma has been acting up and getting worse since Sunday." Modifying factors include application of a "breathing machine" which provides moderate relief, walking which intensifies the shortness of breath. Pt has a hx of asthma, seasonal allergies.   Pt denies fever, chills, sweats, smoking,   PCP is Dr. Parke Simmers.   Past Medical History  Diagnosis Date  . Asthma   . Seasonal allergies      Family History  Problem Relation Age of Onset  . Emphysema Father     History  Substance Use Topics  . Smoking status: Former Smoker -- 0.5 packs/day for 11.5 years    Types: Cigarettes    Quit date: 04/09/2010  . Smokeless tobacco: Never Used   Comment: started at age 62.  1/2 ppd.    . Alcohol Use: No      Review of Systems  All other systems reviewed and are negative.    10 Systems reviewed and all are negative for acute change except as noted in the HPI.    Allergies  Penicillins  Home Medications   Current Outpatient Rx  Name Route Sig Dispense Refill  . ALBUTEROL SULFATE HFA 108 (90 BASE) MCG/ACT IN AERS Inhalation Inhale 1-2 puffs into the lungs every 6 (six) hours as needed for wheezing. 1 Inhaler 5  . ALBUTEROL SULFATE (2.5 MG/3ML) 0.083% IN NEBU Nebulization Take 2.5 mg by nebulization every 6 (six) hours as needed.  For shortness of breath 75 mL 2  . EPINEPHRINE 0.3 MG/0.3ML IJ DEVI Subcutaneous Inject 0.3 mLs (0.3 mg total) into the skin once as needed (For severe asthma or allergic reaction). 1 Device 6  . IBUPROFEN 200 MG PO TABS Oral Take 400 mg by mouth every 6 (six) hours as needed. pain    . MOMETASONE FURO-FORMOTEROL FUM 200-5 MCG/ACT IN AERO Inhalation Inhale 2 puffs into the lungs 2 (two) times daily. 1 Inhaler 5  . MONTELUKAST SODIUM 10 MG PO TABS Oral Take 10 mg by mouth at bedtime.      Marland Kitchen PREDNISONE 10 MG PO TABS Oral Take 4 tablets (40 mg total) by mouth daily. 20 tablet 0    BP 119/68  Pulse 93  Temp(Src) 97.9 F (36.6 C) (Oral)  Resp 20  Ht 6\' 3"  (1.905 m)  Wt 170 lb (77.111 kg)  BMI 21.25 kg/m2  SpO2 96%  Physical Exam  Nursing note and vitals reviewed. Constitutional: He is oriented to person, place, and time. He appears well-developed and well-nourished.  HENT:  Head: Normocephalic and atraumatic.  Nose: Nose normal.  Eyes: Conjunctivae and EOM are normal. Right eye exhibits no discharge. Left eye exhibits no discharge.  Neck: Normal range of motion. Neck supple.  Cardiovascular: Normal rate, regular rhythm and normal heart sounds.   Pulmonary/Chest: Effort normal. He has wheezes (Diffusely. ).  Abdominal: Soft. Bowel sounds  are normal.  Musculoskeletal: Normal range of motion. He exhibits no edema.  Neurological: He is alert and oriented to person, place, and time.  Skin: Skin is warm and dry.  Psychiatric: He has a normal mood and affect. His behavior is normal.    ED Course  Procedures (including critical care time)  DIAGNOSTIC STUDIES: Oxygen Saturation is 96% on room air, adequate by my interpretation.    COORDINATION OF CARE:     Dg Chest 2 View  09/07/2011  *RADIOLOGY REPORT*  Clinical Data: Cough and shortness of breath.  CHEST - 2 VIEW  Comparison: Chest x-ray 08/18/2011.  Findings: Lungs are borderline hyperexpanded without focal airspace consolidation.   No pleural effusions.  Pulmonary vasculature and the cardiomediastinal silhouette are within normal limits.  IMPRESSION: 1.  Borderline hyperexpansion of the lungs, as above.  No other acute findings.  Findings are nonspecific, but could be seen in the setting of reactive airway disease.  Original Report Authenticated By: Florencia Reasons, M.D.    Reexam after albuterol with Atrovent and oral prednisone: The lungs are completely clear and patient states he feels like he is back to baseline. He states he has an adequate supply of albuterol at home. He is sent home with a prescription for prednisone and advised to followup with either his PCP or his pulmonologist to set up an appropriate taper of his steroid dose.   1. Asthma      8:29AM- EDP at bedside discusses treatment plan concerning following up with a PCP.   MDM  Exacerbation of asthma. I am concerned he had a recent exacerbation for which she had been on prednisone. Old records are reviewed he has several ED visits for asthma exacerbation.      I personally performed the services described in this documentation, which was scribed in my presence. The recorded information has been reviewed and considered.      [name], MD 09/07/11 [phone]

## 2011-09-10 ENCOUNTER — Ambulatory Visit (INDEPENDENT_AMBULATORY_CARE_PROVIDER_SITE_OTHER): Payer: 59

## 2011-09-10 DIAGNOSIS — J309 Allergic rhinitis, unspecified: Secondary | ICD-10-CM

## 2011-09-17 ENCOUNTER — Ambulatory Visit (INDEPENDENT_AMBULATORY_CARE_PROVIDER_SITE_OTHER): Payer: 59

## 2011-09-17 DIAGNOSIS — J309 Allergic rhinitis, unspecified: Secondary | ICD-10-CM

## 2011-09-24 ENCOUNTER — Ambulatory Visit (INDEPENDENT_AMBULATORY_CARE_PROVIDER_SITE_OTHER): Payer: 59

## 2011-09-24 DIAGNOSIS — J309 Allergic rhinitis, unspecified: Secondary | ICD-10-CM

## 2011-10-01 ENCOUNTER — Ambulatory Visit (INDEPENDENT_AMBULATORY_CARE_PROVIDER_SITE_OTHER): Payer: 59

## 2011-10-01 DIAGNOSIS — J309 Allergic rhinitis, unspecified: Secondary | ICD-10-CM

## 2011-10-08 ENCOUNTER — Ambulatory Visit (INDEPENDENT_AMBULATORY_CARE_PROVIDER_SITE_OTHER): Payer: 59

## 2011-10-08 DIAGNOSIS — J309 Allergic rhinitis, unspecified: Secondary | ICD-10-CM

## 2011-11-05 ENCOUNTER — Ambulatory Visit (INDEPENDENT_AMBULATORY_CARE_PROVIDER_SITE_OTHER): Payer: 59

## 2011-11-05 DIAGNOSIS — J309 Allergic rhinitis, unspecified: Secondary | ICD-10-CM

## 2011-11-11 ENCOUNTER — Emergency Department (HOSPITAL_COMMUNITY)
Admission: EM | Admit: 2011-11-11 | Discharge: 2011-11-11 | Disposition: A | Discharge status: Home / Self Care | Payer: 59 | Attending: Emergency Medicine | Admitting: Emergency Medicine

## 2011-11-11 ENCOUNTER — Emergency Department (HOSPITAL_COMMUNITY): Payer: 59

## 2011-11-11 ENCOUNTER — Encounter (HOSPITAL_COMMUNITY): Payer: Self-pay | Admitting: *Deleted

## 2011-11-11 DIAGNOSIS — Z79899 Other long term (current) drug therapy: Secondary | ICD-10-CM | POA: Insufficient documentation

## 2011-11-11 DIAGNOSIS — J45901 Unspecified asthma with (acute) exacerbation: Secondary | ICD-10-CM

## 2011-11-11 DIAGNOSIS — R0602 Shortness of breath: Secondary | ICD-10-CM | POA: Insufficient documentation

## 2011-11-11 DIAGNOSIS — Z87891 Personal history of nicotine dependence: Secondary | ICD-10-CM | POA: Insufficient documentation

## 2011-11-11 MED ORDER — IPRATROPIUM BROMIDE 0.02 % IN SOLN
0.5000 mg | Freq: Once | RESPIRATORY_TRACT | Status: AC
  Administered 2011-11-11: 0.5 mg via RESPIRATORY_TRACT
  Filled 2011-11-11: qty 2.5

## 2011-11-11 MED ORDER — ALBUTEROL (5 MG/ML) CONTINUOUS INHALATION SOLN
15.0000 mg/h | INHALATION_SOLUTION | RESPIRATORY_TRACT | Status: AC
  Administered 2011-11-11: 15 mg/h via RESPIRATORY_TRACT
  Filled 2011-11-11: qty 20

## 2011-11-11 MED ORDER — PREDNISONE 20 MG PO TABS: ORAL_TABLET | ORAL | Status: DC

## 2011-11-11 MED ORDER — PREDNISONE 20 MG PO TABS
60.0000 mg | ORAL_TABLET | ORAL | Status: AC
  Administered 2011-11-11: 60 mg via ORAL
  Filled 2011-11-11: qty 3

## 2011-11-11 NOTE — ED Notes (Signed)
Pt c/o sob, wheezing, cough with clear sputum production since last Wednesday, denies any n/v, fever, pt states that he has been having to use his inhaler more during the past week

## 2011-11-11 NOTE — ED Provider Notes (Signed)
History     CSN: [id number]  Arrival date & time 11/11/11  1349   First MD Initiated Contact with Patient 11/11/11 1400      Chief Complaint  Patient presents with  . Wheezing  . Shortness of Breath    (Consider location/radiation/quality/duration/timing/severity/associated sxs/prior treatment) HPI  Patient reports he has a long history of asthma and is followed by Dr. Fannie Knee. He has been having allergy shots for about a year. He relates his last hospitalization was a year ago in January. He reports with the frequent rains and humid weather he has started having shortness of breath and wheezing about 8 days ago that's getting progressively worse. He states he feels short of breath and is having wheezing. He's coughing up clear mucus and has clear rhinorrhea and feels like his nose is often stuffy. He denies any fever, sore throat, vomiting or diarrhea. He states he has to use his nebulizer 2 or 3 times a day because of shortness of breath. He relates he's not currently on steroids.  PCP Dr Lowella Bandy Pulmonary Dr Fannie Knee  Past Medical History  Diagnosis Date  . Asthma   . Seasonal allergies     History reviewed. No pertinent past surgical history.  Family History  Problem Relation Age of Onset  . Emphysema Father     History  Substance Use Topics  . Smoking status: Former Smoker -- 0.5 packs/day for 11.5 years    Types: Cigarettes    Quit date: 04/09/2010  . Smokeless tobacco: Never Used   Comment: started at age 87.  1/2 ppd.    . Alcohol Use: No   employed   Review of Systems  All other systems reviewed and are negative.    Allergies  Penicillins  Home Medications   Current Outpatient Rx  Name Route Sig Dispense Refill  . ALBUTEROL SULFATE HFA 108 (90 BASE) MCG/ACT IN AERS Inhalation Inhale 1-2 puffs into the lungs every 6 (six) hours as needed for wheezing. 1 Inhaler 5  . ALBUTEROL SULFATE (2.5 MG/3ML) 0.083% IN NEBU Nebulization Take 2.5 mg by  nebulization every 6 (six) hours as needed. For shortness of breath 75 mL 2  . MOMETASONE FURO-FORMOTEROL FUM 200-5 MCG/ACT IN AERO Inhalation Inhale 2 puffs into the lungs 2 (two) times daily. 1 Inhaler 5  . MONTELUKAST SODIUM 10 MG PO TABS Oral Take 10 mg by mouth at bedtime.        BP 119/70  Pulse 70  Temp 98.3 F (36.8 C) (Oral)  Resp 20  Ht 6' (1.829 m)  Wt 170 lb (77.111 kg)  BMI 23.06 kg/m2  SpO2 95%  Vital signs normal    Physical Exam  Constitutional: He is oriented to person, place, and time. He appears well-developed and well-nourished.  Non-toxic appearance. He does not appear ill. No distress.  HENT:  Head: Normocephalic and atraumatic.  Right Ear: External ear normal.  Left Ear: External ear normal.  Nose: Nose normal. No mucosal edema or rhinorrhea.  Mouth/Throat: Oropharynx is clear and moist and mucous membranes are normal. No dental abscesses or uvula swelling.  Eyes: Conjunctivae and EOM are normal. Pupils are equal, round, and reactive to light.  Neck: Normal range of motion and full passive range of motion without pain. Neck supple.  Cardiovascular: Normal rate, regular rhythm and normal heart sounds.  Exam reveals no gallop and no friction rub.   No murmur heard. Pulmonary/Chest: He has wheezes. He has no rhonchi. He has  no rales. He exhibits no tenderness and no crepitus.       Retractions. He has diffuse expiratory wheezing in all lung fields.  Abdominal: Soft. Normal appearance and bowel sounds are normal. He exhibits no distension. There is no tenderness. There is no rebound and no guarding.  Musculoskeletal: Normal range of motion. He exhibits no edema and no tenderness.       Moves all extremities well.   Neurological: He is alert and oriented to person, place, and time. He has normal strength. No cranial nerve deficit.  Skin: Skin is warm, dry and intact. No rash noted. No erythema. No pallor.  Psychiatric: He has a normal mood and affect. His speech  is normal and behavior is normal. His mood appears not anxious.    ED Course  Procedures (including critical care time)   Medications  albuterol (PROVENTIL,VENTOLIN) solution continuous neb (15 mg/hr Nebulization New Bag/Given 11/11/11 1431)  predniSONE (DELTASONE) 20 MG tablet (not administered)  predniSONE (DELTASONE) tablet 60 mg (60 mg Oral Given 11/11/11 1416)  ipratropium (ATROVENT) nebulizer solution 0.5 mg (0.5 mg Nebulization Given 11/11/11 1431)   15:35 recheck towards end of continuous neb--lungs clear, rare rhonchi, states he feels better.   Labs Reviewed - No data to display Dg Chest 2 View  11/11/2011  *RADIOLOGY REPORT*  Clinical Data: Wheezing, shortness of breath  CHEST - 2 VIEW  Comparison: 09/07/2011  Findings: Cardiomediastinal silhouette is stable.  Mild hyperinflation again noted.  No acute infiltrate or pleural effusion.  No pulmonary edema.  Bony thorax is stable.  IMPRESSION: No active disease.  Hyperinflation again noted.  Original Report Authenticated By: Natasha Mead, M.D.     1. Asthma exacerbation    New Prescriptions   PREDNISONE (DELTASONE) 20 MG TABLET    Take 3 po QD x 2d starting tomorrow, then 2 po QD x 3d then 1 po QD x 3d    Plan discharge  [name], MD, [facility] Gang    MDM          Ward Givens, MD 11/11/11 1537

## 2011-11-18 ENCOUNTER — Other Ambulatory Visit (INDEPENDENT_AMBULATORY_CARE_PROVIDER_SITE_OTHER): Payer: 59

## 2011-11-18 ENCOUNTER — Ambulatory Visit (INDEPENDENT_AMBULATORY_CARE_PROVIDER_SITE_OTHER): Payer: 59 | Admitting: Internal Medicine

## 2011-11-18 ENCOUNTER — Encounter: Payer: Self-pay | Admitting: Internal Medicine

## 2011-11-18 VITALS — BP 102/64 | HR 54 | Ht 75.0 in | Wt 167.0 lb

## 2011-11-18 DIAGNOSIS — J4550 Severe persistent asthma, uncomplicated: Secondary | ICD-10-CM

## 2011-11-18 DIAGNOSIS — J45909 Unspecified asthma, uncomplicated: Secondary | ICD-10-CM

## 2011-11-18 DIAGNOSIS — R894 Abnormal immunological findings in specimens from other organs, systems and tissues: Secondary | ICD-10-CM

## 2011-11-18 DIAGNOSIS — R768 Other specified abnormal immunological findings in serum: Secondary | ICD-10-CM

## 2011-11-18 LAB — CBC WITH DIFFERENTIAL/PLATELET
Basophils Absolute: 0 10*3/uL (ref 0.0–0.1)
Eosinophils Relative: 0.1 % (ref 0.0–5.0)
HCT: 41 % (ref 39.0–52.0)
Lymphocytes Relative: 20.6 % (ref 12.0–46.0)
Lymphs Abs: 1.9 10*3/uL (ref 0.7–4.0)
Monocytes Relative: 7.9 % (ref 3.0–12.0)
Neutrophils Relative %: 71.2 % (ref 43.0–77.0)
Platelets: 186 10*3/uL (ref 150.0–400.0)
RDW: 14.5 % (ref 11.5–14.6)
WBC: 9.1 10*3/uL (ref 4.5–10.5)

## 2011-11-18 MED ORDER — PREDNISONE 5 MG PO TABS: ORAL_TABLET | ORAL | Status: DC

## 2011-11-18 NOTE — Patient Instructions (Addendum)
When you run out of your prednisone taper, start the prednisone script to take 10 mg daily- sent to drug store  Order- lab- CBC w/ diff, IgE     Dx asthma

## 2011-11-18 NOTE — Progress Notes (Signed)
Patient ID: Drew Sandoval, male    DOB: July 06, 1975, 36 y.o.   MRN: [id number]  HPI 11/04/10- 36 yo former smoker followed for severe asthma, chronic sinusitis, hype-reosinophilia and hyper IgE Sent to me by Dr [name] for allergy evaluation. 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 Allergy profile- 08/11/10 IgE 1,747 with broad specific elevation Last here- August 31, 2010.- note reviewed. Since last here has been in hospital again, getting out 2 weeks ago. Feels well today.  Comes today as planned for allergy skin testing Skin test: Strongly positive especially grass, tree, dust mite  01/04/11- 89 yo former smoker followed for severe asthma, chronic sinusitis, hyper-eosinophilia and hyper IgE.  wife is with him Sent to me by Dr [name] for allergy evaluation. 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 Allergy vaccine was started 11/09/2010 and is building without problems He was still on prednisone when he saw Dr [name] recently and at that time he seemed improved after a recent exacerbation- 2 weeks ago. About 2 weeks after their visit he began coughing again. He hasn't noted any unusual exposure at work, or any sense that he had a cold.  His last IgE was 1747.  He doesn't feel "sick"- just tight and wheezy.  02/18/11-  36 yo former smoker followed for severe asthma, chronic sinusitis, hyper-eosinophilia and hyper IgE.  wife is with him Cooler weather is more comfortable for him. He continues to build allergy vaccine, now at 1:500, without problems. Some days continued to be better than others. 2 days ago thought his eyes were starting to itch and took Benadryl. We discussed comparison antihistamines. Not much sneezing. He has not needed his rescue inhaler in a long time but continues prednisone 20 mg daily. We educated on prednisone again.  04/20/11- 36  yo former smoker followed for severe asthma, chronic sinusitis, hyper-eosinophilia and hyper IgE.  wife is with him Went to ER 3 days ago- got neb and prednisone 60 mg x1. He finished our last taper yesterday. Has had flu shot. Bending over, long walks or climbing stairs, and long work shifts all make him short of breath with wheezing. He worked last night and now feels "65%". He does not think he has an acute cold or infection. He was last really well about 5 days ago. He denies heartburn. He continues building allergy vaccine now at 1:50, without problems. IgE level has been out of range for Xolair.  07/19/11- 36 yo former smoker followed for severe asthma, chronic sinusitis, hyper-eosinophilia and hyper IgE. Separated from wife have now living with his mother. Asthma control is "a whole lot better". He has not missed work. Did have to go to emergency room for kidney stone. Questions allergy vaccine causing "dark spots" on his neck. Using prednisone only occasionally. No routine wheeze, does short of breath at times.  11/18/11- 36 yo former smoker followed for severe asthma, chronic sinusitis, hyper-eosinophilia and hyper IgE. Went to ER for approximately 4 asthma attacks in the past month; He has continued allergy vaccine at 1:50. He is needed his nebulizer 2 or 3 times daily and occasional rescue inhaler. He denies infection or change in workplace exposure. He does not recognize reflux. He usually flares as soon as he finishes the prednisone taper so he and his wife asked about maintenance prednisone which we  discussed. Carefully. Sinus congestion tends to parallel his asthma episodes but he denies purulent nasal discharge or headache. We again discussed the significance of his elevated IgE and eosinophil levels. His IgE has been too high for Xolair but we will reassess. CXR 11/11/11-  IMPRESSION: No active disease. Hyperinflation again noted.  Original Report Authenticated By: Natasha Mead, M.D.     Review of Systems- see HPI Constitutional:   No weight loss, night sweats,  Fevers, chills, fatigue, lassitude. HEENT:   No headaches,  Difficulty swallowing,  Tooth/dental problems,  Sore throat,                No sneezing, ear ache,  + some sniffing, throat itches CV:  No chest pain, orthopnea, PND, swelling in lower extremities, anasarca, dizziness, palpitations GI  No heartburn, indigestion, abdominal pain, nausea, vomiting,  Resp: Today-  .  No coughing up of blood.  No change in color of mucus.   Skin: "dark spots" have improved GU: no dysuria,  MS:  No joint pain or swelling.   Psych:  No change in mood or affect. No depression or anxiety.  No memory loss.  Objective:   Physical Exam General- Alert, Oriented, Affect-appropriate, Distress- none acute;  Tall, trim Skin- areas of mildly hyperpigmented eczema on the sides of his neck Lymphadenopathy- none Head- atraumatic            Eyes- Gross vision intact, PERRLA, conjunctivae clear secretions            Ears- Hearing, canals normal            Nose- Clear, no-Septal dev, mucus, polyps, erosion, perforation             Throat- Mallampati II , mucosa clear , drainage- none, tonsils- atrophic Neck- flexible , trachea midline, no stridor , thyroid nl, carotid no bruit Chest - symmetrical excursion , unlabored           Heart/CV- RRR , no murmur , no gallop  , no rub, nl s1 s2                           - JVD- none , edema- none, stasis changes- none, varices- none           Lung-  end-expiratory rattle with laughter otherwise breath sounds are diminished but clear , dullness-none, rub- none           Chest wall-  Abd-  Br/ Gen/ Rectal- Not done, not indicated Extrem- cyanosis- none, clubbing, none, atrophy- none, strength- nl Neuro- grossly intact to observation

## 2011-11-19 ENCOUNTER — Ambulatory Visit (INDEPENDENT_AMBULATORY_CARE_PROVIDER_SITE_OTHER): Payer: 59

## 2011-11-19 DIAGNOSIS — J309 Allergic rhinitis, unspecified: Secondary | ICD-10-CM

## 2011-11-19 LAB — IGE: IgE (Immunoglobulin E), Serum: 5342.8 IU/mL — ABNORMAL HIGH (ref 0.0–180.0)

## 2011-11-19 NOTE — Progress Notes (Signed)
Quick Note:  LMTCB ______ 

## 2011-11-24 NOTE — Progress Notes (Signed)
Quick Note:  lmomtcb ______ 

## 2011-11-27 NOTE — Assessment & Plan Note (Signed)
Recent triggers are not well defined but probably are related to weather and seasonal changes. His elevated IgE and eosinophilia are probably related and may respond to low-dose maintenance prednisone better than anything else.

## 2011-11-30 ENCOUNTER — Telehealth: Payer: Self-pay | Admitting: Internal Medicine

## 2011-11-30 NOTE — Telephone Encounter (Signed)
Notes Recorded by [name], MD on 11/19/2011 at 1:55 PM Blood count is normal, but IgE allergy antibody score remains too high for Xolair therapy. Best to continue as we are doing for now.  ---  Called, spoke with pt.  I informed him of above results and recs per Dr. Maple Hudson.  He verbalized understanding of this and voiced no further questions/concerns at this time.

## 2011-11-30 NOTE — Progress Notes (Signed)
Quick Note:  Spoke with pt. I informed him of results and recs per Dr. Maple Hudson. He verbalized understanding of this and voiced no further questions/concerns at this time. ______

## 2011-12-10 ENCOUNTER — Ambulatory Visit (INDEPENDENT_AMBULATORY_CARE_PROVIDER_SITE_OTHER): Payer: 59

## 2011-12-10 DIAGNOSIS — J309 Allergic rhinitis, unspecified: Secondary | ICD-10-CM

## 2011-12-30 ENCOUNTER — Emergency Department (HOSPITAL_COMMUNITY): Payer: 59

## 2011-12-30 ENCOUNTER — Encounter (HOSPITAL_COMMUNITY): Payer: Self-pay

## 2011-12-30 ENCOUNTER — Emergency Department (HOSPITAL_COMMUNITY)
Admission: EM | Admit: 2011-12-30 | Discharge: 2011-12-30 | Disposition: A | Discharge status: Home / Self Care | Payer: 59 | Attending: Emergency Medicine | Admitting: Emergency Medicine

## 2011-12-30 DIAGNOSIS — J45901 Unspecified asthma with (acute) exacerbation: Secondary | ICD-10-CM | POA: Insufficient documentation

## 2011-12-30 DIAGNOSIS — Z9109 Other allergy status, other than to drugs and biological substances: Secondary | ICD-10-CM | POA: Insufficient documentation

## 2011-12-30 DIAGNOSIS — Z87891 Personal history of nicotine dependence: Secondary | ICD-10-CM | POA: Insufficient documentation

## 2011-12-30 DIAGNOSIS — Z79899 Other long term (current) drug therapy: Secondary | ICD-10-CM | POA: Insufficient documentation

## 2011-12-30 MED ORDER — PREDNISONE 10 MG PO TABS: 20.0000 mg | ORAL_TABLET | Freq: Every day | ORAL | Status: DC

## 2011-12-30 MED ORDER — SODIUM CHLORIDE 0.9 % IV BOLUS (SEPSIS)
500.0000 mL | Freq: Once | INTRAVENOUS | Status: AC
  Administered 2011-12-30: 500 mL via INTRAVENOUS

## 2011-12-30 MED ORDER — ALBUTEROL SULFATE (5 MG/ML) 0.5% IN NEBU: 2.5000 mg | INHALATION_SOLUTION | Freq: Four times a day (QID) | RESPIRATORY_TRACT | Status: DC | PRN

## 2011-12-30 MED ORDER — ALBUTEROL SULFATE (5 MG/ML) 0.5% IN NEBU
2.5000 mg | INHALATION_SOLUTION | Freq: Once | RESPIRATORY_TRACT | Status: DC
  Filled 2011-12-30: qty 0.5

## 2011-12-30 MED ORDER — METHYLPREDNISOLONE SODIUM SUCC 125 MG IJ SOLR
125.0000 mg | Freq: Once | INTRAMUSCULAR | Status: AC
  Administered 2011-12-30: 125 mg via INTRAVENOUS
  Filled 2011-12-30: qty 2

## 2011-12-30 MED ORDER — ALBUTEROL SULFATE (5 MG/ML) 0.5% IN NEBU
2.5000 mg | INHALATION_SOLUTION | Freq: Once | RESPIRATORY_TRACT | Status: AC
  Administered 2011-12-30: 2.5 mg via RESPIRATORY_TRACT

## 2011-12-30 MED ORDER — SODIUM CHLORIDE 0.9 % IV SOLN: INTRAVENOUS | Status: DC

## 2011-12-30 MED ORDER — IPRATROPIUM BROMIDE HFA 17 MCG/ACT IN AERS: 2.0000 | INHALATION_SPRAY | Freq: Once | RESPIRATORY_TRACT | Status: DC

## 2011-12-30 MED ORDER — IPRATROPIUM BROMIDE 0.02 % IN SOLN
RESPIRATORY_TRACT | Status: AC
  Administered 2011-12-30: 0.5 mg via RESPIRATORY_TRACT
  Filled 2011-12-30: qty 2.5

## 2011-12-30 MED ORDER — IPRATROPIUM BROMIDE 0.02 % IN SOLN
0.5000 mg | Freq: Once | RESPIRATORY_TRACT | Status: AC
  Administered 2011-12-30: 0.5 mg via RESPIRATORY_TRACT

## 2011-12-30 NOTE — ED Notes (Signed)
Asthma attack. States he has been having problems for a few weeks but has been using is inhaler. States he left his inhaler at home today. Two albuterol breathing treatment given prior to arrival

## 2011-12-30 NOTE — ED Provider Notes (Signed)
History    This chart was scribed for Shelda Jakes, MD, MD by Smitty Pluck. The patient was seen in room APA15 and the patient's care was started at 3:37PM.   CSN: [id number]  Arrival date & time 12/30/11  1524   First MD Initiated Contact with Patient 12/30/11 1537      Chief Complaint  Patient presents with  . Shortness of Breath    (Consider location/radiation/quality/duration/timing/severity/associated sxs/prior treatment) Patient is a 36 y.o. male presenting with shortness of breath. The history is provided by the patient.  Shortness of Breath  The current episode started more than 2 weeks ago. The onset was sudden. The problem occurs frequently. The problem has been gradually worsening. The problem is moderate. The symptoms are relieved by beta-agonist inhalers. Nothing aggravates the symptoms. Associated symptoms include chest pain, a fever, cough and shortness of breath. His past medical history is significant for asthma. There were no sick contacts. Recently, medical care has been given by EMS. Services received include medications given.   Drew Sandoval is a 36 y.o. male who presents to the Emergency Department BIB EMS due to asthma and SOB onset 2-3 weeks ago but with symptoms worsening today. Pt reports that he has persistent cough and is unable to cough up phlegm. Pt was given two albuterol treatments via EMS with minor relief. He states he is still wheezing. Pt reports mild chest pain associated with breathing.  Past Medical History  Diagnosis Date  . Asthma   . Seasonal allergies     History reviewed. No pertinent past surgical history.  Family History  Problem Relation Age of Onset  . Emphysema Father     History  Substance Use Topics  . Smoking status: Former Smoker -- 0.5 packs/day for 11.5 years    Types: Cigarettes    Quit date: 04/09/2010  . Smokeless tobacco: Never Used   Comment: started at age 11.  1/2 ppd.    . Alcohol Use: No       Review of Systems  Constitutional: Positive for fever.  HENT: Negative for congestion and neck pain.   Respiratory: Positive for cough and shortness of breath.   Cardiovascular: Positive for chest pain.  Gastrointestinal: Positive for abdominal pain. Negative for nausea, vomiting and diarrhea.  Musculoskeletal: Negative for back pain.    Allergies  Penicillins  Home Medications   Current Outpatient Rx  Name Route Sig Dispense Refill  . ALBUTEROL SULFATE HFA 108 (90 BASE) MCG/ACT IN AERS Inhalation Inhale 1-2 puffs into the lungs every 6 (six) hours as needed for wheezing. 1 Inhaler 5  . ALBUTEROL SULFATE (2.5 MG/3ML) 0.083% IN NEBU Nebulization Take 2.5 mg by nebulization every 6 (six) hours as needed. For shortness of breath 75 mL 2  . ALBUTEROL SULFATE (5 MG/ML) 0.5% IN NEBU Nebulization Take 0.5 mLs (2.5 mg total) by nebulization every 6 (six) hours as needed for wheezing. 20 mL 12  . MOMETASONE FURO-FORMOTEROL FUM 200-5 MCG/ACT IN AERO Inhalation Inhale 2 puffs into the lungs 2 (two) times daily. 1 Inhaler 5  . MONTELUKAST SODIUM 10 MG PO TABS Oral Take 10 mg by mouth at bedtime.      Marland Kitchen PREDNISONE 10 MG PO TABS Oral Take 2 tablets (20 mg total) by mouth daily. 10 tablet 0  . PREDNISONE 20 MG PO TABS  Take 3 po QD x 2d starting tomorrow, then 2 po QD x 3d then 1 po QD x 3d 15 tablet 0  . PREDNISONE  5 MG PO TABS  Take 2 tablets by mouth daily or as instructed 100 tablet 0    BP 118/74  Pulse 79  Temp 97.9 F (36.6 C) (Oral)  Resp 20  SpO2 97%  Physical Exam  Nursing note and vitals reviewed. Constitutional: He is oriented to person, place, and time. He appears well-developed and well-nourished. No distress.  HENT:  Head: Normocephalic and atraumatic.  Neck: Normal range of motion. Neck supple.  Cardiovascular: Regular rhythm and normal heart sounds.  Tachycardia present.   Pulmonary/Chest: Effort normal. No respiratory distress. He has wheezes (bilaterally).        Good air movement   Abdominal: Soft. Bowel sounds are normal. He exhibits no distension. There is no tenderness.  Lymphadenopathy:    He has no cervical adenopathy.  Neurological: He is alert and oriented to person, place, and time. No cranial nerve deficit.  Skin: Skin is warm and dry.  Psychiatric: He has a normal mood and affect. His behavior is normal.    ED Course  Procedures (including critical care time) DIAGNOSTIC STUDIES: Oxygen Saturation is 97% on room air, normal by my interpretation.    COORDINATION OF CARE: 3:45PM Ordered:  Medications  0.9 %  sodium chloride infusion (not administered)  ipratropium (ATROVENT HFA) inhaler 2 puff (  Inhalation Canceled Entry 12/30/11 1605)  predniSONE (DELTASONE) 10 MG tablet (not administered)  albuterol (PROVENTIL) (5 MG/ML) 0.5% nebulizer solution (not administered)  sodium chloride 0.9 % bolus 500 mL (500 mL Intravenous Given 12/30/11 1551)  methylPREDNISolone sodium succinate (SOLU-MEDROL) 125 mg/2 mL injection 125 mg (125 mg Intravenous Given 12/30/11 1551)  albuterol (PROVENTIL) (5 MG/ML) 0.5% nebulizer solution 2.5 mg (2.5 mg Nebulization Given 12/30/11 1606)  ipratropium (ATROVENT) nebulizer solution 0.5 mg (0.5 mg Nebulization Given 12/30/11 1606)     Labs Reviewed - No data to display Dg Chest 2 View  12/30/2011  *RADIOLOGY REPORT*  Clinical Data: 36 year old male with shortness of breath x2 weeks.  CHEST - 2 VIEW  Comparison: 11/11/2011 and earlier.  Findings: Stable or slightly lower lung volumes.  Cardiac size and mediastinal contours are within normal limits.  Visualized tracheal air column is within normal limits.  No pneumothorax, pulmonary edema, pleural effusion or confluent pulmonary opacity. No acute osseous abnormality identified.  IMPRESSION: No acute cardiopulmonary abnormality.   Original Report Authenticated By: Harley Hallmark, M.D.      1. Asthma exacerbation attacks       MDM   Patient presented with  wheezing Re: had 2 albuterol nebulizers via EMS wheezing was still persistent bilaterally. Oxygen saturations were normal. Patient given IV Solu Medrol having another treatment of albuterol Atrovent nebulizer wheezing all resolved. Chest x-rays negative no acute findings. Patient's albuterol nebulizer solution will be renewed and he was started on a five-day course of prednisone 40 mg each day.     I personally performed the services described in this documentation, which was scribed in my presence. The recorded information has been reviewed and considered.     Shelda Jakes, MD 12/30/11 [phone]

## 2011-12-31 ENCOUNTER — Ambulatory Visit: Payer: 59 | Admitting: Internal Medicine

## 2012-01-14 ENCOUNTER — Ambulatory Visit (INDEPENDENT_AMBULATORY_CARE_PROVIDER_SITE_OTHER): Payer: 59

## 2012-01-14 DIAGNOSIS — J309 Allergic rhinitis, unspecified: Secondary | ICD-10-CM

## 2012-01-21 ENCOUNTER — Ambulatory Visit (INDEPENDENT_AMBULATORY_CARE_PROVIDER_SITE_OTHER): Payer: 59

## 2012-01-21 DIAGNOSIS — J309 Allergic rhinitis, unspecified: Secondary | ICD-10-CM

## 2012-02-01 ENCOUNTER — Emergency Department (HOSPITAL_COMMUNITY)
Admission: EM | Admit: 2012-02-01 | Discharge: 2012-02-01 | Disposition: A | Discharge status: Home / Self Care | Payer: 59 | Attending: Emergency Medicine | Admitting: Emergency Medicine

## 2012-02-01 ENCOUNTER — Encounter (HOSPITAL_COMMUNITY): Payer: Self-pay

## 2012-02-01 ENCOUNTER — Emergency Department (HOSPITAL_COMMUNITY): Payer: 59

## 2012-02-01 DIAGNOSIS — J9801 Acute bronchospasm: Secondary | ICD-10-CM

## 2012-02-01 DIAGNOSIS — J45909 Unspecified asthma, uncomplicated: Secondary | ICD-10-CM | POA: Insufficient documentation

## 2012-02-01 DIAGNOSIS — Z87891 Personal history of nicotine dependence: Secondary | ICD-10-CM | POA: Insufficient documentation

## 2012-02-01 LAB — CBC WITH DIFFERENTIAL/PLATELET
Basophils Absolute: 0.1 10*3/uL (ref 0.0–0.1)
Basophils Relative: 1 % (ref 0–1)
Hemoglobin: 15.2 g/dL (ref 13.0–17.0)
Lymphocytes Relative: 14 % (ref 12–46)
MCHC: 34 g/dL (ref 30.0–36.0)
Monocytes Relative: 7 % (ref 3–12)
Neutro Abs: 4.7 10*3/uL (ref 1.7–7.7)
Neutrophils Relative %: 62 % (ref 43–77)
WBC: 7.6 10*3/uL (ref 4.0–10.5)

## 2012-02-01 LAB — COMPREHENSIVE METABOLIC PANEL
AST: 14 U/L (ref 0–37)
Albumin: 4.1 g/dL (ref 3.5–5.2)
Alkaline Phosphatase: 73 U/L (ref 39–117)
BUN: 8 mg/dL (ref 6–23)
CO2: 28 mEq/L (ref 19–32)
Chloride: 105 mEq/L (ref 96–112)
Potassium: 3.5 mEq/L (ref 3.5–5.1)
Total Bilirubin: 0.5 mg/dL (ref 0.3–1.2)

## 2012-02-01 MED ORDER — LEVALBUTEROL HCL 1.25 MG/0.5ML IN NEBU
1.2500 mg | INHALATION_SOLUTION | Freq: Once | RESPIRATORY_TRACT | Status: AC
  Administered 2012-02-01: 1.25 mg via RESPIRATORY_TRACT
  Filled 2012-02-01: qty 0.5

## 2012-02-01 MED ORDER — LORAZEPAM 2 MG/ML IJ SOLN
1.0000 mg | Freq: Once | INTRAMUSCULAR | Status: AC
  Administered 2012-02-01: 1 mg via INTRAVENOUS
  Filled 2012-02-01: qty 1

## 2012-02-01 MED ORDER — IPRATROPIUM BROMIDE HFA 17 MCG/ACT IN AERS: INHALATION_SPRAY | RESPIRATORY_TRACT | Status: DC

## 2012-02-01 MED ORDER — PREDNISONE 10 MG PO TABS: 20.0000 mg | ORAL_TABLET | Freq: Every day | ORAL | Status: DC

## 2012-02-01 MED ORDER — METHYLPREDNISOLONE SODIUM SUCC 125 MG IJ SOLR
125.0000 mg | Freq: Once | INTRAMUSCULAR | Status: AC
  Administered 2012-02-01: 125 mg via INTRAVENOUS
  Filled 2012-02-01: qty 2

## 2012-02-01 MED ORDER — IPRATROPIUM BROMIDE 0.02 % IN SOLN
0.5000 mg | Freq: Once | RESPIRATORY_TRACT | Status: AC
  Administered 2012-02-01: 0.5 mg via RESPIRATORY_TRACT
  Filled 2012-02-01: qty 2.5

## 2012-02-01 MED ORDER — ALBUTEROL SULFATE (5 MG/ML) 0.5% IN NEBU
5.0000 mg | INHALATION_SOLUTION | Freq: Once | RESPIRATORY_TRACT | Status: AC
  Administered 2012-02-01: 5 mg via RESPIRATORY_TRACT
  Filled 2012-02-01: qty 1

## 2012-02-01 NOTE — ED Notes (Signed)
Respiratory paged on pt's arrival to ED room.

## 2012-02-01 NOTE — ED Provider Notes (Signed)
History   This chart was scribed for Benny Lennert, MD by Gerlean Ren. This patient was seen in room APA14/APA14 and the patient's care was started at 10:42AM.   CSN: [id number]  Arrival date & time 02/01/12  1028   First MD Initiated Contact with Patient 02/01/12 1039      Chief Complaint  Patient presents with  . Cough  . Shortness of Breath    (Consider location/radiation/quality/duration/timing/severity/associated sxs/prior treatment) Patient is a 36 y.o. male presenting with shortness of breath. The history is provided by the patient. No language interpreter was used.  Shortness of Breath  The current episode started today. The onset was sudden. The problem occurs continuously. The problem has been unchanged. The problem is moderate. Associated symptoms include cough and shortness of breath. Pertinent negatives include no chest pain and no wheezing.  Drew Sandoval is a 36 y.o. male with h/o asthma who presents to the Emergency Department complaining of sudden onset SOB beginning approx. 8 hours ago with associated cough producing clear sputum.  Pt reports taking breathing treatment at onset of SOB with improvements, but symptoms have persisted.  Pt is a former smoker (0.5pack/day for 11.5 years, quit 04/2010) and denies alcohol use.     Past Medical History  Diagnosis Date  . Asthma   . Seasonal allergies     History reviewed. No pertinent past surgical history.  Family History  Problem Relation Age of Onset  . Emphysema Father     History  Substance Use Topics  . Smoking status: Former Smoker -- 0.5 packs/day for 11.5 years    Types: Cigarettes    Quit date: 04/09/2010  . Smokeless tobacco: Never Used   Comment: started at age 35.  1/2 ppd.    . Alcohol Use: No      Review of Systems  Constitutional: Negative for fatigue.  HENT: Negative for congestion, sinus pressure and ear discharge.   Eyes: Negative for discharge.  Respiratory: Positive for cough and  shortness of breath. Negative for wheezing.   Cardiovascular: Negative for chest pain.  Gastrointestinal: Negative for abdominal pain and diarrhea.  Genitourinary: Negative for frequency and hematuria.  Musculoskeletal: Negative for back pain.  Skin: Negative for rash.  Neurological: Negative for seizures.  Hematological: Negative.   Psychiatric/Behavioral: Negative for hallucinations.    Allergies  Penicillins  Home Medications   Current Outpatient Rx  Name Route Sig Dispense Refill  . ALBUTEROL SULFATE HFA 108 (90 BASE) MCG/ACT IN AERS Inhalation Inhale 1-2 puffs into the lungs every 6 (six) hours as needed for wheezing. 1 Inhaler 5  . ALBUTEROL SULFATE (2.5 MG/3ML) 0.083% IN NEBU Nebulization Take 2.5 mg by nebulization every 6 (six) hours as needed. For shortness of breath 75 mL 2  . ALBUTEROL SULFATE (5 MG/ML) 0.5% IN NEBU Nebulization Take 0.5 mLs (2.5 mg total) by nebulization every 6 (six) hours as needed for wheezing. 20 mL 12  . MOMETASONE FURO-FORMOTEROL FUM 200-5 MCG/ACT IN AERO Inhalation Inhale 2 puffs into the lungs 2 (two) times daily. 1 Inhaler 5  . MONTELUKAST SODIUM 10 MG PO TABS Oral Take 10 mg by mouth at bedtime.      Marland Kitchen PREDNISONE 10 MG PO TABS Oral Take 2 tablets (20 mg total) by mouth daily. 10 tablet 0  . PREDNISONE 20 MG PO TABS  Take 3 po QD x 2d starting tomorrow, then 2 po QD x 3d then 1 po QD x 3d 15 tablet 0  . PREDNISONE 5  MG PO TABS  Take 2 tablets by mouth daily or as instructed 100 tablet 0    BP 150/116  Pulse 91  Temp 98 F (36.7 C) (Oral)  Resp 28  Ht 6\' 3"  (1.905 m)  Wt 170 lb (77.111 kg)  BMI 21.25 kg/m2  SpO2 95%  Physical Exam  Nursing note and vitals reviewed. Constitutional: He is oriented to person, place, and time. He appears well-developed.       Anxious.  HENT:  Head: Normocephalic and atraumatic.  Eyes: Conjunctivae normal and EOM are normal. No scleral icterus.  Neck: Neck supple. No thyromegaly present.  Cardiovascular:  Normal rate, regular rhythm and normal heart sounds.  Exam reveals no gallop and no friction rub.   No murmur heard. Pulmonary/Chest: Effort normal and breath sounds normal. No stridor. He has no wheezes. He has no rales. He exhibits no tenderness.       Tachypneic.  Abdominal: He exhibits no distension. There is no tenderness. There is no rebound.  Musculoskeletal: Normal range of motion. He exhibits no edema.  Lymphadenopathy:    He has no cervical adenopathy.  Neurological: He is oriented to person, place, and time. Coordination normal.  Skin: No rash noted. No erythema.  Psychiatric: He has a normal mood and affect. His behavior is normal.    ED Course  Procedures (including critical care time) DIAGNOSTIC STUDIES: Oxygen Saturation is 95% on [place], adequate by my interpretation.    COORDINATION OF CARE: 10:45AM- Ordered breathing treatment and ativan injection.    Labs Reviewed - No data to display No results found. Results for orders placed during the hospital encounter of 02/01/12  CBC WITH DIFFERENTIAL      Component Value Range   WBC 7.6  4.0 - 10.5 K/uL   RBC 5.07  4.22 - 5.81 MIL/uL   Hemoglobin 15.2  13.0 - 17.0 g/dL   HCT 40.9  81.1 - 91.4 %   MCV 88.2  78.0 - 100.0 fL   MCH 30.0  26.0 - 34.0 pg   MCHC 34.0  30.0 - 36.0 g/dL   RDW 78.2  95.6 - 21.3 %   Platelets 254  150 - 400 K/uL   Neutrophils Relative 62  43 - 77 %   Neutro Abs 4.7  1.7 - 7.7 K/uL   Lymphocytes Relative 14  12 - 46 %   Lymphs Abs 1.1  0.7 - 4.0 K/uL   Monocytes Relative 7  3 - 12 %   Monocytes Absolute 0.6  0.1 - 1.0 K/uL   Eosinophils Relative 15 (*) 0 - 5 %   Eosinophils Absolute 1.2 (*) 0.0 - 0.7 K/uL   Basophils Relative 1  0 - 1 %   Basophils Absolute 0.1  0.0 - 0.1 K/uL  COMPREHENSIVE METABOLIC PANEL      Component Value Range   Sodium 143  135 - 145 mEq/L   Potassium 3.5  3.5 - 5.1 mEq/L   Chloride 105  96 - 112 mEq/L   CO2 28  19 - 32 mEq/L   Glucose, Bld 88  70 - 99 mg/dL    BUN 8  6 - 23 mg/dL   Creatinine, Ser 0.86  0.50 - 1.35 mg/dL   Calcium 9.1  8.4 - 57.8 mg/dL   Total Protein 7.6  6.0 - 8.3 g/dL   Albumin 4.1  3.5 - 5.2 g/dL   AST 14  0 - 37 U/L   ALT 8  0 - 53  U/L   Alkaline Phosphatase 73  39 - 117 U/L   Total Bilirubin 0.5  0.3 - 1.2 mg/dL   GFR calc non Af Amer >90  >90 mL/min   GFR calc Af Amer >90  >90 mL/min    Dg Chest Port 1 View  02/01/2012  *RADIOLOGY REPORT*  Clinical Data: Asthma attack, shortness of breath  PORTABLE CHEST - 1 VIEW  Comparison: Portable exam 1058 hours compared to 12/30/2011  Findings: Very tips of the lung apices are excluded. Normal heart size, mediastinal contours, and pulmonary vascularity. Peribronchial thickening and hyperlucency compatible with history asthma. No infiltrate, pleural effusion or definite pneumothorax. Bones unremarkable.  IMPRESSION: Peribronchial thickening and hyperlucency of the lungs compatible with history of asthma. No acute abnormalities.   Original Report Authenticated By: Lollie Marrow, M.D.     No diagnosis found.  Pt improved at discharge. Pt had minimal wheezes  MDM   The chart was scribed for me under my direct supervision.  I personally performed the history, physical, and medical decision making and all procedures in the evaluation of this patient.Benny Lennert, MD 02/01/12 [phone]

## 2012-02-01 NOTE — ED Notes (Signed)
Pt c/o cough and SOB since yesterday.  Pt tearful, labored respirations.  Last albuterol treatment was around 3am.

## 2012-02-03 ENCOUNTER — Telehealth: Payer: Self-pay | Admitting: Internal Medicine

## 2012-02-03 NOTE — Telephone Encounter (Signed)
Per Katie:  Enter message as relayed by Dr. Laural Benes, send directly to [facility] & close message.  Drew Sandoval

## 2012-02-07 ENCOUNTER — Encounter: Payer: Self-pay | Admitting: *Deleted

## 2012-02-07 ENCOUNTER — Ambulatory Visit (INDEPENDENT_AMBULATORY_CARE_PROVIDER_SITE_OTHER): Payer: 59 | Admitting: Internal Medicine

## 2012-02-07 ENCOUNTER — Encounter: Payer: Self-pay | Admitting: Internal Medicine

## 2012-02-07 ENCOUNTER — Ambulatory Visit: Payer: 59 | Admitting: Internal Medicine

## 2012-02-07 VITALS — BP 120/76 | HR 71 | Ht 75.0 in | Wt 161.8 lb

## 2012-02-07 DIAGNOSIS — R0989 Other specified symptoms and signs involving the circulatory and respiratory systems: Secondary | ICD-10-CM

## 2012-02-07 DIAGNOSIS — J4550 Severe persistent asthma, uncomplicated: Secondary | ICD-10-CM

## 2012-02-07 DIAGNOSIS — D71 Functional disorders of polymorphonuclear neutrophils: Secondary | ICD-10-CM

## 2012-02-07 DIAGNOSIS — J45909 Unspecified asthma, uncomplicated: Secondary | ICD-10-CM

## 2012-02-07 DIAGNOSIS — D824 Hyperimmunoglobulin E [IgE] syndrome: Secondary | ICD-10-CM

## 2012-02-07 DIAGNOSIS — R0600 Dyspnea, unspecified: Secondary | ICD-10-CM

## 2012-02-07 MED ORDER — THEOPHYLLINE ER 400 MG PO CP24: 400.0000 mg | ORAL_CAPSULE | Freq: Every day | ORAL | Status: DC

## 2012-02-07 NOTE — Progress Notes (Signed)
Patient ID: Drew Sandoval, male    DOB: July 06, 1975, 36 y.o.   MRN: [id number]  HPI 11/04/10- 36 yo former smoker followed for severe asthma, chronic sinusitis, hype-reosinophilia and hyper IgE Sent to me by Dr [name] for allergy evaluation. 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 Allergy profile- 08/11/10 IgE 1,747 with broad specific elevation Last here- August 31, 2010.- note reviewed. Since last here has been in hospital again, getting out 2 weeks ago. Feels well today.  Comes today as planned for allergy skin testing Skin test: Strongly positive especially grass, tree, dust mite  01/04/11- 89 yo former smoker followed for severe asthma, chronic sinusitis, hyper-eosinophilia and hyper IgE.  wife is with him Sent to me by Dr [name] for allergy evaluation. 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 Allergy vaccine was started 11/09/2010 and is building without problems He was still on prednisone when he saw Dr [name] recently and at that time he seemed improved after a recent exacerbation- 2 weeks ago. About 2 weeks after their visit he began coughing again. He hasn't noted any unusual exposure at work, or any sense that he had a cold.  His last IgE was 1747.  He doesn't feel "sick"- just tight and wheezy.  02/18/11-  36 yo former smoker followed for severe asthma, chronic sinusitis, hyper-eosinophilia and hyper IgE.  wife is with him Cooler weather is more comfortable for him. He continues to build allergy vaccine, now at 1:500, without problems. Some days continued to be better than others. 2 days ago thought his eyes were starting to itch and took Benadryl. We discussed comparison antihistamines. Not much sneezing. He has not needed his rescue inhaler in a long time but continues prednisone 20 mg daily. We educated on prednisone again.  04/20/11- 36  yo former smoker followed for severe asthma, chronic sinusitis, hyper-eosinophilia and hyper IgE.  wife is with him Went to ER 3 days ago- got neb and prednisone 60 mg x1. He finished our last taper yesterday. Has had flu shot. Bending over, long walks or climbing stairs, and long work shifts all make him short of breath with wheezing. He worked last night and now feels "65%". He does not think he has an acute cold or infection. He was last really well about 5 days ago. He denies heartburn. He continues building allergy vaccine now at 1:50, without problems. IgE level has been out of range for Xolair.  07/19/11- 36 yo former smoker followed for severe asthma, chronic sinusitis, hyper-eosinophilia and hyper IgE. Separated from wife have now living with his mother. Asthma control is "a whole lot better". He has not missed work. Did have to go to emergency room for kidney stone. Questions allergy vaccine causing "dark spots" on his neck. Using prednisone only occasionally. No routine wheeze, does short of breath at times.  11/18/11- 36 yo former smoker followed for severe asthma, chronic sinusitis, hyper-eosinophilia and hyper IgE. Went to ER for approximately 4 asthma attacks in the past month; He has continued allergy vaccine at 1:50. He is needed his nebulizer 2 or 3 times daily and occasional rescue inhaler. He denies infection or change in workplace exposure. He does not recognize reflux. He usually flares as soon as he finishes the prednisone taper so he and his wife asked about maintenance prednisone which we  discussed. Carefully. Sinus congestion tends to parallel his asthma episodes but he denies purulent nasal discharge or headache. We again discussed the significance of his elevated IgE and eosinophil levels. His IgE has been too high for Xolair but we will reassess. CXR 11/11/11-  IMPRESSION: No active disease. Hyperinflation again noted.  Original Report Authenticated By: Natasha Mead, M.D.    02/07/12- 36 yo former smoker followed for severe asthma, chronic sinusitis, hyper-eosinophilia and hyper IgE. FOLLOWS FOR: post hospital at [facility] 924 through 02/04/2012. DC summary reviewed. Discharge diagnosis asthma. Asthma began during the night. He took his own nebulizer, felt better and tried to go to work but had to leave. He had presented to [facility] ER where he was treated with 3 nebulizer treatments and sent out. He went from there to the [facility] emergency room. Discharged on a steroid taper. They comment in the summary that he was significantly depressed because he had watched his father died of asthma. He admits today that he had not told us of that history before. He feels better now but still wheezing and washed out as he tapers prednisone. He denies any sense of reflux or any recognized triggering experience. In the past he had thought there was something associated with his work place as discussed in earlier notes. He comments today that he can't breathe if he sleeps on his left side. There is no history of heart disease. He quit allergy vaccine in April, 2013.  Review of Systems- see HPI Constitutional:   No weight loss, night sweats,  Fevers, chills, fatigue, lassitude. HEENT:   No headaches,  Difficulty swallowing,  Tooth/dental problems,  Sore throat,                No sneezing, ear ache,  + some sniffing, throat itches CV:  No chest pain, orthopnea, PND, swelling in lower extremities, anasarca, dizziness, palpitations GI  No heartburn, indigestion, abdominal pain, nausea, vomiting,  Resp: Today-  + wheeze.  No coughing up of blood.  No change in color of mucus.   Skin: "dark spots" have improved GU: no dysuria,  MS:  No joint pain or swelling.   Psych:  No change in mood or affect. + Situational depression or anxiety.  No memory loss.  Objective:   Physical Exam General- Alert, Oriented, Affect-+nonchalant, Distress- none acute;   Tall, trim Skin- clear Lymphadenopathy- none Head- atraumatic            Eyes- Gross vision intact, PERRLA, conjunctivae clear secretions            Ears- Hearing, canals normal            Nose- Clear, no-Septal dev, mucus, polyps, erosion, perforation             Throat- Mallampati II , mucosa clear , drainage- none, tonsils- atrophic Neck- flexible , trachea midline, no stridor , thyroid nl, carotid no bruit Chest - symmetrical excursion , unlabored           Heart/CV- RRR , no murmur , no gallop  , no rub, nl s1 s2                           - JVD- none , edema- none, stasis changes- none, varices- none           Lung- + bilateral wheeze, unlabored  , dullness-none, rub- none  Chest wall-  Abd-  Br/ Gen/ Rectal- Not done, not indicated Extrem- cyanosis- none, clubbing, none, atrophy- none, strength- nl Neuro- grossly intact to observation

## 2012-02-07 NOTE — Patient Instructions (Addendum)
Taper the steroid pills as directed then go back to prednisone 10 mg daily  Script sent to try theophylline/ Theo-24  1 each morning with food  Order- schedule 2D echocardiogram  Dx dyspnea, question pulmonary hypertension ( comment- dyspnea prevents sleep on left side)

## 2012-02-10 ENCOUNTER — Ambulatory Visit: Payer: 59 | Admitting: Internal Medicine

## 2012-02-11 ENCOUNTER — Ambulatory Visit (INDEPENDENT_AMBULATORY_CARE_PROVIDER_SITE_OTHER): Payer: 59

## 2012-02-11 DIAGNOSIS — J309 Allergic rhinitis, unspecified: Secondary | ICD-10-CM

## 2012-02-13 DIAGNOSIS — D824 Hyperimmunoglobulin E [IgE] syndrome: Secondary | ICD-10-CM | POA: Insufficient documentation

## 2012-02-13 NOTE — Assessment & Plan Note (Signed)
Repeated elevation of IgE level into the thousands precludes use of Xolair

## 2012-02-13 NOTE — Assessment & Plan Note (Addendum)
Severe asthma exacerbation, uncertain trigger. Slowly resolving on steroid taper. No obvious infection.  Question why he is too dyspneic to sleep on his left side. He has no cardiac history but this can be a cardiac marker related to left ventricular filling. We will seek evaluation because his asthma is so difficult.-> Echo  Plan-taper  steroids down to 10 mg daily maintenance. Add theophylline with discussion

## 2012-02-14 ENCOUNTER — Ambulatory Visit: Payer: 59 | Admitting: Internal Medicine

## 2012-02-16 ENCOUNTER — Other Ambulatory Visit (HOSPITAL_COMMUNITY): Payer: 59

## 2012-02-18 ENCOUNTER — Ambulatory Visit (INDEPENDENT_AMBULATORY_CARE_PROVIDER_SITE_OTHER): Payer: 59

## 2012-02-18 DIAGNOSIS — J309 Allergic rhinitis, unspecified: Secondary | ICD-10-CM

## 2012-03-03 ENCOUNTER — Ambulatory Visit (INDEPENDENT_AMBULATORY_CARE_PROVIDER_SITE_OTHER): Payer: 59

## 2012-03-03 ENCOUNTER — Telehealth: Payer: Self-pay | Admitting: Internal Medicine

## 2012-03-03 DIAGNOSIS — J309 Allergic rhinitis, unspecified: Secondary | ICD-10-CM

## 2012-03-03 DIAGNOSIS — J45909 Unspecified asthma, uncomplicated: Secondary | ICD-10-CM

## 2012-03-03 MED ORDER — ALBUTEROL SULFATE (2.5 MG/3ML) 0.083% IN NEBU: 2.5000 mg | INHALATION_SOLUTION | Freq: Four times a day (QID) | RESPIRATORY_TRACT | Status: DC | PRN

## 2012-03-03 MED ORDER — PREDNISONE 10 MG PO TABS: ORAL_TABLET | ORAL | Status: DC

## 2012-03-03 NOTE — Telephone Encounter (Signed)
Spoke with pt to advise that I refilled his albuterol neb sol He is now requesting a pred taper be called in- c/o increased SOB, wheezing and chest tightness- onset approx 4 hours ago.  Please advise thanks! Allergies  Allergen Reactions  . Penicillins Rash

## 2012-03-03 NOTE — Telephone Encounter (Signed)
Per Dr. Maple Hudson - okay to send in prednisone taper, 10mg  QID for 2 days, TID for 2 days, BID for 2 days, 1 Q day for 2 days then stop.  This has been sent to pt pharmacy and he is aware of this.

## 2012-03-10 ENCOUNTER — Ambulatory Visit (INDEPENDENT_AMBULATORY_CARE_PROVIDER_SITE_OTHER): Payer: 59

## 2012-03-10 DIAGNOSIS — J309 Allergic rhinitis, unspecified: Secondary | ICD-10-CM

## 2012-03-13 ENCOUNTER — Telehealth: Payer: Self-pay | Admitting: Internal Medicine

## 2012-03-13 ENCOUNTER — Encounter: Payer: Self-pay | Admitting: *Deleted

## 2012-03-13 MED ORDER — PREDNISONE 10 MG PO TABS: ORAL_TABLET | ORAL | Status: DC

## 2012-03-13 NOTE — Telephone Encounter (Signed)
Spoke with pt. He states "asthma acting up this morning"- had some chest tightness and increased SOB, so did not go to work. I offered ov with CDY for tomorrow, but he refused. He states that he is feeling much better now after using neb tx and feels like he can go back to work tomorrow. Pt is requesting that CDY write him a note to excuse him from the day he missed today. Please advise, thanks!

## 2012-03-13 NOTE — Telephone Encounter (Signed)
Letter given to patient and aware that [facility] would like patient to begin prednisone taper. Sent to pharmacy on file.

## 2012-03-21 ENCOUNTER — Encounter: Payer: Self-pay | Admitting: Internal Medicine

## 2012-03-21 ENCOUNTER — Ambulatory Visit (INDEPENDENT_AMBULATORY_CARE_PROVIDER_SITE_OTHER): Payer: 59

## 2012-03-21 ENCOUNTER — Ambulatory Visit: Payer: 59

## 2012-03-21 ENCOUNTER — Ambulatory Visit (INDEPENDENT_AMBULATORY_CARE_PROVIDER_SITE_OTHER): Payer: 59 | Admitting: Internal Medicine

## 2012-03-21 VITALS — BP 124/70 | HR 68 | Ht 75.0 in | Wt 164.0 lb

## 2012-03-21 DIAGNOSIS — J309 Allergic rhinitis, unspecified: Secondary | ICD-10-CM

## 2012-03-21 DIAGNOSIS — J4550 Severe persistent asthma, uncomplicated: Secondary | ICD-10-CM

## 2012-03-21 DIAGNOSIS — J45909 Unspecified asthma, uncomplicated: Secondary | ICD-10-CM

## 2012-03-21 MED ORDER — MOMETASONE FURO-FORMOTEROL FUM 200-5 MCG/ACT IN AERO: 2.0000 | INHALATION_SPRAY | Freq: Two times a day (BID) | RESPIRATORY_TRACT | Status: DC

## 2012-03-21 MED ORDER — MONTELUKAST SODIUM 10 MG PO TABS: 10.0000 mg | ORAL_TABLET | Freq: Every day | ORAL | Status: DC

## 2012-03-21 MED ORDER — PREDNISONE 10 MG PO TABS: ORAL_TABLET | ORAL | Status: DC

## 2012-03-21 NOTE — Progress Notes (Signed)
Patient ID: Drew Sandoval, male    DOB: July 06, 1975, 36 y.o.   MRN: [id number]  HPI 11/04/10- 36 yo former smoker followed for severe asthma, chronic sinusitis, hype-reosinophilia and hyper IgE Sent to me by Dr [name] for allergy evaluation. 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 Allergy profile- 08/11/10 IgE 1,747 with broad specific elevation Last here- August 31, 2010.- note reviewed. Since last here has been in hospital again, getting out 2 weeks ago. Feels well today.  Comes today as planned for allergy skin testing Skin test: Strongly positive especially grass, tree, dust mite  01/04/11- 36 yo former smoker followed for severe asthma, chronic sinusitis, hyper-eosinophilia and hyper IgE.  wife is with him Sent to me by Dr [name] for allergy evaluation. 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 Allergy vaccine was started 11/09/2010 and is building without problems He was still on prednisone when he saw Dr [name] recently and at that time he seemed improved after a recent exacerbation- 2 weeks ago. About 2 weeks after their visit he began coughing again. He hasn't noted any unusual exposure at work, or any sense that he had a cold.  His last IgE was 1747.  He doesn't feel "sick"- just tight and wheezy.  02/18/11-  36 yo former smoker followed for severe asthma, chronic sinusitis, hyper-eosinophilia and hyper IgE.  wife is with him Cooler weather is more comfortable for him. He continues to build allergy vaccine, now at 1:500, without problems. Some days continued to be better than others. 2 days ago thought his eyes were starting to itch and took Benadryl. We discussed comparison antihistamines. Not much sneezing. He has not needed his rescue inhaler in a long time but continues prednisone 20 mg daily. We educated on prednisone again.  04/20/11- 36  yo former smoker followed for severe asthma, chronic sinusitis, hyper-eosinophilia and hyper IgE.  wife is with him Went to ER 3 days ago- got neb and prednisone 60 mg x1. He finished our last taper yesterday. Has had flu shot. Bending over, long walks or climbing stairs, and long work shifts all make him short of breath with wheezing. He worked last night and now feels "65%". He does not think he has an acute cold or infection. He was last really well about 5 days ago. He denies heartburn. He continues building allergy vaccine now at 1:50, without problems. IgE level has been out of range for Xolair.  07/19/11- 36 yo former smoker followed for severe asthma, chronic sinusitis, hyper-eosinophilia and hyper IgE. Separated from wife have now living with his mother. Asthma control is "a whole lot better". He has not missed work. Did have to go to emergency room for kidney stone. Questions allergy vaccine causing "dark spots" on his neck. Using prednisone only occasionally. No routine wheeze, does short of breath at times.  11/18/11- 36 yo former smoker followed for severe asthma, chronic sinusitis, hyper-eosinophilia and hyper IgE. Went to ER for approximately 4 asthma attacks in the past month; He has continued allergy vaccine at 1:50. He is needed his nebulizer 2 or 3 times daily and occasional rescue inhaler. He denies infection or change in workplace exposure. He does not recognize reflux. He usually flares as soon as he finishes the prednisone taper so he and his wife asked about maintenance prednisone which we  discussed. Carefully. Sinus congestion tends to parallel his asthma episodes but he denies purulent nasal discharge or headache. We again discussed the significance of his elevated IgE and eosinophil levels. His IgE has been too high for Xolair but we will reassess. CXR 11/11/11-  IMPRESSION: No active disease. Hyperinflation again noted.  Original Report Authenticated By: Natasha Mead, M.D.    02/07/12- 36 yo former smoker followed for severe asthma, chronic sinusitis, hyper-eosinophilia and hyper IgE. FOLLOWS FOR: post hospital at [facility] 924 through 02/04/2012. DC summary reviewed. Discharge diagnosis asthma. Asthma began during the night. He took his own nebulizer, felt better and tried to go to work but had to leave. He had presented to [facility] ER where he was treated with 3 nebulizer treatments and sent out. He went from there to the [facility] emergency room. Discharged on a steroid taper. They comment in the summary that he was significantly depressed because he had watched his father died of asthma. He admits today that he had not told us of that history before. He feels better now but still wheezing and washed out as he tapers prednisone. He denies any sense of reflux or any recognized triggering experience. In the past he had thought there was something associated with his work place as discussed in earlier notes. He comments today that he can't breathe if he sleeps on his left side. There is no history of heart disease. He quit allergy vaccine in April, 2013.  03/21/12- 36 yo former smoker followed for severe asthma, chronic sinusitis, hyper-eosinophilia and hyper IgE. FOLLOWS FOR: states that breathing is doing good, still gets allergy shots 1:50 GH. He says he is doing well since her last call for prednisone on November 4. He had to Miss work for a day and needed a note. Missed one day last week as well. He says his job is safe and he actually has most asthma flares at home, not at work. Denies any infection now. Tends to wake up in the morning wheezing without any sense of acid reflux. He can usually tell at bedtime if he is going to have trouble during the night or in the morning. [name] is cost prohibitive. Uses nebulizer about twice daily. We are asking for records release so [facility] consent as results of cultures and chest x-ray  report.   Review of Systems- see HPI Constitutional:   No weight loss, night sweats,  Fevers, chills, fatigue, lassitude. HEENT:   No headaches,  Difficulty swallowing,  Tooth/dental problems,  Sore throat,                No sneezing, ear ache,  + some sniffing, throat itches CV:  No chest pain, orthopnea, PND, swelling in lower extremities, anasarca, dizziness, palpitations GI  No heartburn, indigestion, abdominal pain, nausea, vomiting,  Resp: Today-  no- wheeze.  No coughing up of blood.  No change in color of mucus.   Skin: "dark spots" have improved GU: no dysuria,  MS:  No joint pain or swelling.   Psych:  No change in mood or affect. + Situational depression or anxiety.  No memory loss.  Objective:   Physical Exam General- Alert, Oriented, Affect-+nonchalant, Distress- none acute;  Tall, trim Skin- clear Lymphadenopathy- none Head- atraumatic            Eyes- Gross vision intact, PERRLA, conjunctivae clear secretions            Ears- Hearing, canals normal  Nose- Clear, no-Septal dev, mucus, polyps, erosion, perforation             Throat- Mallampati II , mucosa clear , drainage- none, tonsils- atrophic Neck- flexible , trachea midline, no stridor , thyroid nl, carotid no bruit Chest - symmetrical excursion , unlabored           Heart/CV- RRR , no murmur , no gallop  , no rub, nl s1 s2                           - JVD- none , edema- none, stasis changes- none, varices- none           Lung-  clear, unlabored  , dullness-none, rub- none, wheeze-none           Chest wall-  Abd-  Br/ Gen/ Rectal- Not done, not indicated Extrem- cyanosis- none, clubbing, none, atrophy- none, strength- nl Neuro- grossly intact to observation

## 2012-03-21 NOTE — Patient Instructions (Addendum)
We will ask [facility] for lab and CXR results including microbiology/ blood cultures  Refill scripts for prednisone and Singulair  Sample x 2 and script Dulera 200    2puffs, then rinse mouth, twice daily

## 2012-03-29 NOTE — Assessment & Plan Note (Signed)
Intermittently steroid sensitive. Unable to afford [facility] and I think access to a maintenance controller will be he. We have educated on avoidance of allergy triggers and good environmental precautions. If his IgE  will come down, he may someday qualify for Xolair. For now we will continue allergy vaccine.

## 2012-03-31 ENCOUNTER — Ambulatory Visit (INDEPENDENT_AMBULATORY_CARE_PROVIDER_SITE_OTHER): Payer: 59

## 2012-03-31 DIAGNOSIS — J309 Allergic rhinitis, unspecified: Secondary | ICD-10-CM

## 2012-04-03 ENCOUNTER — Ambulatory Visit (INDEPENDENT_AMBULATORY_CARE_PROVIDER_SITE_OTHER): Payer: 59

## 2012-04-03 DIAGNOSIS — J309 Allergic rhinitis, unspecified: Secondary | ICD-10-CM

## 2012-05-22 ENCOUNTER — Ambulatory Visit (INDEPENDENT_AMBULATORY_CARE_PROVIDER_SITE_OTHER): Payer: 59 | Admitting: Internal Medicine

## 2012-05-22 ENCOUNTER — Encounter: Payer: Self-pay | Admitting: Internal Medicine

## 2012-05-22 VITALS — BP 124/74 | HR 78 | Ht 75.0 in | Wt 165.0 lb

## 2012-05-22 DIAGNOSIS — J4550 Severe persistent asthma, uncomplicated: Secondary | ICD-10-CM

## 2012-05-22 DIAGNOSIS — J45909 Unspecified asthma, uncomplicated: Secondary | ICD-10-CM

## 2012-05-22 MED ORDER — ALBUTEROL SULFATE HFA 108 (90 BASE) MCG/ACT IN AERS: 2.0000 | INHALATION_SPRAY | RESPIRATORY_TRACT | Status: DC | PRN

## 2012-05-22 NOTE — Patient Instructions (Addendum)
Script for albuterol HFA rescue inhaler printed  Take the prednisone if you need it, but pay attention to how much you take so we know over time, as discussed  Please call as needed

## 2012-05-22 NOTE — Progress Notes (Signed)
Patient ID: Drew Sandoval, male    DOB: July 06, 1975, 37 y.o.   MRN: [id number]  HPI 11/04/10- 37 yo former smoker followed for severe asthma, chronic sinusitis, hype-reosinophilia and hyper IgE Sent to me by Dr [name] for allergy evaluation. 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 Allergy profile- 08/11/10 IgE 1,747 with broad specific elevation Last here- August 31, 2010.- note reviewed. Since last here has been in hospital again, getting out 2 weeks ago. Feels well today.  Comes today as planned for allergy skin testing Skin test: Strongly positive especially grass, tree, dust mite  01/04/11- 89 yo former smoker followed for severe asthma, chronic sinusitis, hyper-eosinophilia and hyper IgE.  wife is with him Sent to me by Dr [name] for allergy evaluation. 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 Allergy vaccine was started 11/09/2010 and is building without problems He was still on prednisone when he saw Dr [name] recently and at that time he seemed improved after a recent exacerbation- 2 weeks ago. About 2 weeks after their visit he began coughing again. He hasn't noted any unusual exposure at work, or any sense that he had a cold.  His last IgE was 1747.  He doesn't feel "sick"- just tight and wheezy.  02/18/11-  37 yo former smoker followed for severe asthma, chronic sinusitis, hyper-eosinophilia and hyper IgE.  wife is with him Cooler weather is more comfortable for him. He continues to build allergy vaccine, now at 1:500, without problems. Some days continued to be better than others. 2 days ago thought his eyes were starting to itch and took Benadryl. We discussed comparison antihistamines. Not much sneezing. He has not needed his rescue inhaler in a long time but continues prednisone 20 mg daily. We educated on prednisone again.  04/20/11- 37  yo former smoker followed for severe asthma, chronic sinusitis, hyper-eosinophilia and hyper IgE.  wife is with him Went to ER 3 days ago- got neb and prednisone 60 mg x1. He finished our last taper yesterday. Has had flu shot. Bending over, long walks or climbing stairs, and long work shifts all make him short of breath with wheezing. He worked last night and now feels "65%". He does not think he has an acute cold or infection. He was last really well about 5 days ago. He denies heartburn. He continues building allergy vaccine now at 1:50, without problems. IgE level has been out of range for Xolair.  07/19/11- 37 yo former smoker followed for severe asthma, chronic sinusitis, hyper-eosinophilia and hyper IgE. Separated from wife have now living with his mother. Asthma control is "a whole lot better". He has not missed work. Did have to go to emergency room for kidney stone. Questions allergy vaccine causing "dark spots" on his neck. Using prednisone only occasionally. No routine wheeze, does short of breath at times.  11/18/11- 37 yo former smoker followed for severe asthma, chronic sinusitis, hyper-eosinophilia and hyper IgE. Went to ER for approximately 4 asthma attacks in the past month; He has continued allergy vaccine at 1:50. He is needed his nebulizer 2 or 3 times daily and occasional rescue inhaler. He denies infection or change in workplace exposure. He does not recognize reflux. He usually flares as soon as he finishes the prednisone taper so he and his wife asked about maintenance prednisone which we  discussed. Carefully. Sinus congestion tends to parallel his asthma episodes but he denies purulent nasal discharge or headache. We again discussed the significance of his elevated IgE and eosinophil levels. His IgE has been too high for Xolair but we will reassess. CXR 11/11/11-  IMPRESSION: No active disease. Hyperinflation again noted.  Original Report Authenticated By: Natasha Mead, M.D.    02/07/12- 38 yo former smoker followed for severe asthma, chronic sinusitis, hyper-eosinophilia and hyper IgE. FOLLOWS FOR: post hospital at [facility] 924 through 02/04/2012. DC summary reviewed. Discharge diagnosis asthma. Asthma began during the night. He took his own nebulizer, felt better and tried to go to work but had to leave. He had presented to [facility] ER where he was treated with 3 nebulizer treatments and sent out. He went from there to the [facility] emergency room. Discharged on a steroid taper. They comment in the summary that he was significantly depressed because he had watched his father died of asthma. He admits today that he had not told Drew Sandoval of that history before. He feels better now but still wheezing and washed out as he tapers prednisone. He denies any sense of reflux or any recognized triggering experience. In the past he had thought there was something associated with his work place as discussed in earlier notes. He comments today that he can't breathe if he sleeps on his left side. There is no history of heart disease. He quit allergy vaccine in April, 2013.  03/21/12- 34 yo former smoker followed for severe asthma, chronic sinusitis, hyper-eosinophilia and hyper IgE. FOLLOWS FOR: states that breathing is doing good, still gets allergy shots 1:50 GH. He says he is doing well since her last call for prednisone on November 4. He had to Miss work for a day and needed a note. Missed one day last week as well. He says his job is safe and he actually has most asthma flares at home, not at work. Denies any infection now. Tends to wake up in the morning wheezing without any sense of acid reflux. He can usually tell at bedtime if he is going to have trouble during the night or in the morning. Drew Sandoval is cost prohibitive. Uses nebulizer about twice daily. We are asking for records release so [facility] consent as results of cultures and chest x-ray  report.  05/22/12- 18 yo former smoker followed for severe asthma, chronic sinusitis, hyper-eosinophilia and hyper IgE.  Wife here FOLLOWS FOR: breathing was doing bad earlier today but he took a breathing tx and that helped. denies chest pain,  chest tightness or increased SOB. Last pred yeterday- we did steroid safety talk again. Started feeling tighter last night- blames"getting hot". Used his neb. CXR 02/01/12 IMPRESSION:  Peribronchial thickening and hyperlucency of the lungs compatible  with history of asthma.  No acute abnormalities.  Original Report Authenticated By: Lollie Marrow, M.D.   Review of Systems- see HPI Constitutional:   No weight loss, night sweats,  Fevers, chills, fatigue, lassitude. HEENT:   No headaches,  Difficulty swallowing,  Tooth/dental problems,  Sore throat,                No sneezing, ear ache,  + some sniffing, throat itches CV:  No chest pain, orthopnea, PND, swelling in lower extremities, anasarca, dizziness, palpitations GI  No heartburn, indigestion, abdominal pain, nausea, vomiting,  Resp: + wheeze.  No coughing up of blood.  No change in color of mucus.   Skin: no  acute rash GU: no dysuria,  MS:  No joint pain or swelling.   Psych:  No change in mood or affect. No depression or anxiety.  No memory loss.  Objective:   Physical Exam General- Alert, Oriented, Affect-+calm, Distress- none acute;  Tall, trim Skin- clear Lymphadenopathy- none Head- atraumatic            Eyes- Gross vision intact, PERRLA, conjunctivae clear secretions            Ears- Hearing, canals normal            Nose- Clear, no-Septal dev, mucus, polyps, erosion, perforation             Throat- Mallampati II , mucosa clear , drainage- none, tonsils- atrophic Neck- flexible , trachea midline, no stridor , thyroid nl, carotid no bruit Chest - symmetrical excursion , unlabored           Heart/CV- RRR , no murmur , no gallop  , no rub, nl s1 s2                           - JVD- none  , edema- none, stasis changes- none, varices- none           Lung-   unlabored, no cough , dullness-none, rub- none, + light wheeze R chest           Chest wall-  Abd-  Br/ Gen/ Rectal- Not done, not indicated Extrem- cyanosis- none, clubbing, none, atrophy- none, strength- nl Neuro- grossly intact to observation

## 2012-06-02 ENCOUNTER — Telehealth: Payer: Self-pay | Admitting: Internal Medicine

## 2012-06-02 NOTE — Assessment & Plan Note (Signed)
Recent exacerbation, doesn't sound like infection. He feels he can control with his usual meds. We had a discussion about steroids. If his IgE ever comes down, he may be candidate for Xolair.

## 2012-06-02 NOTE — Telephone Encounter (Signed)
Spoke with pt He is c/o increased SOB, chest tightness, and dry cough x 2 wks He states that he finished a pred taper 4 days ago, and while he took the med it only helped for about 2 days He states taking neb tx 5 times per day with very little relief Please advise thanks! Allergies  Allergen Reactions  . Penicillins Rash   Last ov 05/22/12 Next ov 08/21/12

## 2012-06-02 NOTE — Telephone Encounter (Signed)
Per CY-Prednisone 40 mg daily till more stable then taper gradually

## 2012-06-02 NOTE — Telephone Encounter (Signed)
Spoke with patient, informed him of recs as listed below per Dr. Maple Hudson. Patient verbalized understand, states he does not need any more prednisone called in and nothing further needed at this time.

## 2012-06-26 ENCOUNTER — Telehealth: Payer: Self-pay | Admitting: Internal Medicine

## 2012-06-26 MED ORDER — HYDROCODONE-HOMATROPINE 5-1.5 MG/5ML PO SYRP: 5.0000 mL | ORAL_SOLUTION | Freq: Four times a day (QID) | ORAL | Status: DC | PRN

## 2012-06-26 NOTE — Telephone Encounter (Signed)
Last OV 05-22-12. Pt states he went to the hospital in [place] on Feb 8th due to an asthma flare and he was prescribed some cough syrup. Pt states he is out and is still having a dry cough mainly at night and is asking for an rx for hydrocodone cough syrup. Please advise. Carron Curie, CMA Allergies  Allergen Reactions  . Penicillins Rash

## 2012-06-26 NOTE — Telephone Encounter (Signed)
Ok to refill hydromet cough syrup  200 ml, 1 tsp every 6 hours for occasional use  For cough    No refill

## 2012-06-26 NOTE — Telephone Encounter (Signed)
Pt aware that RX has been called to [facility] in [place], [place] at his request.

## 2012-07-18 ENCOUNTER — Telehealth: Payer: Self-pay | Admitting: Internal Medicine

## 2012-07-18 ENCOUNTER — Encounter: Payer: Self-pay | Admitting: *Deleted

## 2012-07-18 NOTE — Telephone Encounter (Signed)
Per CY-okay to give patient letter. Pt came by the office and got letter.

## 2012-08-11 ENCOUNTER — Telehealth: Payer: Self-pay

## 2012-08-11 ENCOUNTER — Telehealth: Payer: Self-pay | Admitting: Internal Medicine

## 2012-08-11 MED ORDER — PREDNISONE 10 MG PO TABS: ORAL_TABLET | ORAL | Status: DC

## 2012-08-11 NOTE — Telephone Encounter (Signed)
Called and spoke with family member and they stated that the pt is requesting a refill of the prednisone.  Stated that pt is c/o chest tightness and increase sob x 2 days.  CY please advise. Thanks  Last ov--05/22/2012 Next ov--08/21/2012  Allergies  Allergen Reactions  . Penicillins Rash

## 2012-08-11 NOTE — Telephone Encounter (Signed)
Error.  Wrong doc.  Holly D Pryor ° °

## 2012-08-11 NOTE — Telephone Encounter (Signed)
Ok to refill prednisone 10 mg, # 20, 4 X 2 DAYS, 3 X 2 DAYS, 2 X 2 DAYS, 1 X 2 DAYS

## 2012-08-11 NOTE — Telephone Encounter (Signed)
Called, spoke with pt's family member.  Informed her of below per Dr. Maple Hudson.  She verbalized understanding of this, is aware [facility] [place], and will inform pt.  She will have him call back if symptoms do not improve or worsen.  Nothing further needed at this time.

## 2012-08-16 ENCOUNTER — Telehealth: Payer: Self-pay | Admitting: Internal Medicine

## 2012-08-16 NOTE — Telephone Encounter (Signed)
Per pt's chart, was given an 8-day pred taper on 4.4.14 for chest tightness and SOB.  Is he still taking this?  Called spoke with patient who c/o "constant dry cough", denying any SOB, tightness, wheezing, f/c/s, mucus production.  Asked patient if he is still taking his prednisone prescribed on 4.4.14 and how many tabs he is taking.  Pt stated that he is not taking the prednisone anymore and "only takes it when he needs it."  Asked pt to clarify, as the script was sent with specific tapering instructions.  Pt stated that he took 2 tabs when he got the rx "to open him up and help his breathing" and has not taken any since.  Explained to pt the importance of taking his medication the way CY prescribes it to get maximum benefit but pt just stated again that he only takes prednisone prn.  Dr Maple Hudson please advise, thanks. [facility] Upcoming ov w/ CY 4.14.14 @ 4pm (verified that pt is aware of ov) Allergies  Allergen Reactions  . Penicillins Rash   Katie spoke with CY Per CY: needs to take prednisone as directed from the 4.4.14 prescription.  If symptoms are no better or are worse before appt, needs to call or seek emergency assistance.  Called spoke with patient.  Advised of CY's recs as stated above with explicit tapering instructions.  Pt verbalized his understanding and denied any questions.  Pt to call if anything further is needed prior to appt or seek emergency care.  Will sign off.

## 2012-08-21 ENCOUNTER — Encounter: Payer: Self-pay | Admitting: Internal Medicine

## 2012-08-21 ENCOUNTER — Ambulatory Visit (INDEPENDENT_AMBULATORY_CARE_PROVIDER_SITE_OTHER): Payer: 59 | Admitting: Internal Medicine

## 2012-08-21 VITALS — BP 136/80 | HR 98 | Ht 75.0 in | Wt 161.2 lb

## 2012-08-21 DIAGNOSIS — J45909 Unspecified asthma, uncomplicated: Secondary | ICD-10-CM

## 2012-08-21 DIAGNOSIS — J4550 Severe persistent asthma, uncomplicated: Secondary | ICD-10-CM

## 2012-08-21 MED ORDER — LEVALBUTEROL HCL 0.63 MG/3ML IN NEBU
0.6300 mg | INHALATION_SOLUTION | Freq: Once | RESPIRATORY_TRACT | Status: AC
  Administered 2012-08-21: 0.63 mg via RESPIRATORY_TRACT

## 2012-08-21 MED ORDER — PREDNISONE 10 MG PO TABS: ORAL_TABLET | ORAL | Status: DC

## 2012-08-21 MED ORDER — METHYLPREDNISOLONE ACETATE 80 MG/ML IJ SUSP
80.0000 mg | Freq: Once | INTRAMUSCULAR | Status: AC
  Administered 2012-08-21: 80 mg via INTRAMUSCULAR

## 2012-08-21 NOTE — Progress Notes (Signed)
Patient ID: Drew Sandoval, male    DOB: July 06, 1975, 37 y.o.   MRN: [id number]  HPI 11/04/10- 37 yo former smoker followed for severe asthma, chronic sinusitis, hype-reosinophilia and hyper IgE Sent to me by Dr [name] for allergy evaluation. 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 Allergy profile- 08/11/10 IgE 1,747 with broad specific elevation Last here- August 31, 2010.- note reviewed. Since last here has been in hospital again, getting out 2 weeks ago. Feels well today.  Comes today as planned for allergy skin testing Skin test: Strongly positive especially grass, tree, dust mite  01/04/11- 89 yo former smoker followed for severe asthma, chronic sinusitis, hyper-eosinophilia and hyper IgE.  wife is with him Sent to me by Dr [name] for allergy evaluation. 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 Allergy vaccine was started 11/09/2010 and is building without problems He was still on prednisone when he saw Dr [name] recently and at that time he seemed improved after a recent exacerbation- 2 weeks ago. About 2 weeks after their visit he began coughing again. He hasn't noted any unusual exposure at work, or any sense that he had a cold.  His last IgE was 1747.  He doesn't feel "sick"- just tight and wheezy.  02/18/11-  37 yo former smoker followed for severe asthma, chronic sinusitis, hyper-eosinophilia and hyper IgE.  wife is with him Cooler weather is more comfortable for him. He continues to build allergy vaccine, now at 1:500, without problems. Some days continued to be better than others. 2 days ago thought his eyes were starting to itch and took Benadryl. We discussed comparison antihistamines. Not much sneezing. He has not needed his rescue inhaler in a long time but continues prednisone 20 mg daily. We educated on prednisone again.  04/20/11- 37  yo former smoker followed for severe asthma, chronic sinusitis, hyper-eosinophilia and hyper IgE.  wife is with him Went to ER 3 days ago- got neb and prednisone 60 mg x1. He finished our last taper yesterday. Has had flu shot. Bending over, long walks or climbing stairs, and long work shifts all make him short of breath with wheezing. He worked last night and now feels "65%". He does not think he has an acute cold or infection. He was last really well about 5 days ago. He denies heartburn. He continues building allergy vaccine now at 1:50, without problems. IgE level has been out of range for Xolair.  07/19/11- 37 yo former smoker followed for severe asthma, chronic sinusitis, hyper-eosinophilia and hyper IgE. Separated from wife have now living with his mother. Asthma control is "a whole lot better". He has not missed work. Did have to go to emergency room for kidney stone. Questions allergy vaccine causing "dark spots" on his neck. Using prednisone only occasionally. No routine wheeze, does short of breath at times.  11/18/11- 37 yo former smoker followed for severe asthma, chronic sinusitis, hyper-eosinophilia and hyper IgE. Went to ER for approximately 4 asthma attacks in the past month; He has continued allergy vaccine at 1:50. He is needed his nebulizer 2 or 3 times daily and occasional rescue inhaler. He denies infection or change in workplace exposure. He does not recognize reflux. He usually flares as soon as he finishes the prednisone taper so he and his wife asked about maintenance prednisone which we  discussed. Carefully. Sinus congestion tends to parallel his asthma episodes but he denies purulent nasal discharge or headache. We again discussed the significance of his elevated IgE and eosinophil levels. His IgE has been too high for Xolair but we will reassess. CXR 11/11/11-  IMPRESSION: No active disease. Hyperinflation again noted.  Original Report Authenticated By: Natasha Mead, M.D.    02/07/12- 37 yo former smoker followed for severe asthma, chronic sinusitis, hyper-eosinophilia and hyper IgE. FOLLOWS FOR: post hospital at [facility] 924 through 02/04/2012. DC summary reviewed. Discharge diagnosis asthma. Asthma began during the night. He took his own nebulizer, felt better and tried to go to work but had to leave. He had presented to [facility] ER where he was treated with 3 nebulizer treatments and sent out. He went from there to the [facility] emergency room. Discharged on a steroid taper. They comment in the summary that he was significantly depressed because he had watched his father died of asthma. He admits today that he had not told us of that history before. He feels better now but still wheezing and washed out as he tapers prednisone. He denies any sense of reflux or any recognized triggering experience. In the past he had thought there was something associated with his work place as discussed in earlier notes. He comments today that he can't breathe if he sleeps on his left side. There is no history of heart disease. He quit allergy vaccine in April, 2013.  03/21/12- 37 yo former smoker followed for severe asthma, chronic sinusitis, hyper-eosinophilia and hyper IgE. FOLLOWS FOR: states that breathing is doing good, still gets allergy shots 1:50 GH. He says he is doing well since her last call for prednisone on November 4. He had to Miss work for a day and needed a note. Missed one day last week as well. He says his job is safe and he actually has most asthma flares at home, not at work. Denies any infection now. Tends to wake up in the morning wheezing without any sense of acid reflux. He can usually tell at bedtime if he is going to have trouble during the night or in the morning. [name] is cost prohibitive. Uses nebulizer about twice daily. We are asking for records release so [facility] consent as results of cultures and chest x-ray  report.  05/22/12- 37 yo former smoker followed for severe asthma, chronic sinusitis, hyper-eosinophilia and hyper IgE.  Wife here FOLLOWS FOR: breathing was doing bad earlier today but he took a breathing tx and that helped. denies chest pain,  chest tightness or increased SOB. Last pred yeterday- we did steroid safety talk again. Started feeling tighter last night- blames"getting hot". Used his neb. CXR 02/01/12 IMPRESSION:  Peribronchial thickening and hyperlucency of the lungs compatible  with history of asthma.  No acute abnormalities.  Original Report Authenticated By: Lollie Marrow, M.D.   08/21/12- 73 yo former smoker followed for severe asthma, chronic sinusitis, hyper-eosinophilia and hyper IgE- too high for Xolair.   FOLLOWS FOR: went to hospital in [place] last night-asthmatic bronchitis. Alot of wheezing, cough; was given Zpak(has to get filled today) Has not taken allergy injection in a while due to illness ER visit in [place] last night for exacerbation of asthma again. Treated with antibiotic and still tightly wheezing. He has continued allergy vaccine 1:50 GH with no reactions. He is only clear when he is on prednisone.  Review of Systems- see HPI Constitutional:   No weight  loss, night sweats,  Fevers, chills, fatigue, lassitude. HEENT:   No headaches,  Difficulty swallowing,  Tooth/dental problems,  Sore throat,                No sneezing, ear ache,  + some sniffing, throat itches CV:  No chest pain, orthopnea, PND, swelling in lower extremities, anasarca, dizziness, palpitations GI  No heartburn, indigestion, abdominal pain, nausea, vomiting,  Resp: + wheeze.  No coughing up of blood.  No change in color of mucus.   Skin: no acute rash GU: no dysuria,  MS:  No joint pain or swelling.   Psych:  No change in mood or affect. No depression or anxiety.  No memory loss.  Objective:   Physical Exam General- Alert, Oriented, Affect-+calm, Distress- none acute;  Tall, trim Skin-  clear Lymphadenopathy- none Head- atraumatic            Eyes- Gross vision intact, PERRLA, conjunctivae clear secretions            Ears- Hearing, canals normal            Nose- Clear, no-Septal dev, mucus, polyps, erosion, perforation             Throat- Mallampati II , mucosa clear , drainage- none, tonsils- atrophic Neck- flexible , trachea midline, no stridor , thyroid nl, carotid no bruit Chest - symmetrical excursion , unlabored           Heart/CV- RRR , no murmur , no gallop  , no rub, nl s1 s2                           - JVD- none , edema- none, stasis changes- none, varices- none           Lung-   +labored, no cough , dullness-none, rub- none, + bilateral wheeze I&E           Chest wall-  Abd-  Br/ Gen/ Rectal- Not done, not indicated Extrem- cyanosis- none, clubbing, none, atrophy- none, strength- nl Neuro- grossly intact to observation

## 2012-08-21 NOTE — Patient Instructions (Addendum)
Neb xop 0.63   Depo 11  Ok to fill your prescription for zithromax from [facility] for slow prednisone taper   40 mg x 3 days, 30 mg x 3 days, 20 mg x 3 days, 10 mg x 3 days

## 2012-08-22 ENCOUNTER — Telehealth: Payer: Self-pay | Admitting: Internal Medicine

## 2012-08-22 ENCOUNTER — Encounter: Payer: Self-pay | Admitting: *Deleted

## 2012-08-22 NOTE — Telephone Encounter (Signed)
Per CY-okay to give letter

## 2012-08-22 NOTE — Telephone Encounter (Signed)
Spoke with patient, patient seen yesterday by Dr. Maple Hudson. States he used his FMLA to be off work and now needs a note to return. Would like note to say that its ok to return to work WITHOUT any restrictions. Dr. Maple Hudson please advise if this note can be done, thank you   If so, patient would like to pick it up from our office.

## 2012-08-22 NOTE — Telephone Encounter (Signed)
Letter done and pt aware; picked up in office today.

## 2012-08-27 NOTE — Assessment & Plan Note (Signed)
Another exacerbation requiring ER trip in [place] on 08/20/2012. We discussed treatment options. Plan-Xopenex nebulizer treatment, Depo-Medrol, prednisone slow taper from 40 mg

## 2012-08-30 ENCOUNTER — Telehealth: Payer: Self-pay | Admitting: Internal Medicine

## 2012-08-30 NOTE — Telephone Encounter (Signed)
If and only if he is prepared to restart and come regularly for his shots, then restart him at 1:50, 0.1 ml/ vial and build as tolerated.

## 2012-08-30 NOTE — Telephone Encounter (Signed)
Drew Sandoval's last inj. Was 03/31/12(1:50 at 0.5). I'll call him and ask how are things going I know you've been sick. Are you ready to start your shots again? If he says yes what dose and strength do you want me to start him at? Please advise.

## 2012-08-30 NOTE — Telephone Encounter (Signed)
Called pt. Told him what you said and He said he'd be in Fri.(09/01/12). He said he ment to ask you when he saw you earlier this month,he forgot.

## 2012-09-05 ENCOUNTER — Ambulatory Visit (INDEPENDENT_AMBULATORY_CARE_PROVIDER_SITE_OTHER): Payer: 59

## 2012-09-05 DIAGNOSIS — J309 Allergic rhinitis, unspecified: Secondary | ICD-10-CM

## 2012-09-12 ENCOUNTER — Ambulatory Visit (INDEPENDENT_AMBULATORY_CARE_PROVIDER_SITE_OTHER): Payer: 59

## 2012-09-12 ENCOUNTER — Encounter: Payer: Self-pay | Admitting: *Deleted

## 2012-09-12 ENCOUNTER — Telehealth: Payer: Self-pay | Admitting: Internal Medicine

## 2012-09-12 DIAGNOSIS — J309 Allergic rhinitis, unspecified: Secondary | ICD-10-CM

## 2012-09-12 NOTE — Telephone Encounter (Signed)
Per CY-okay to give work note.

## 2012-09-12 NOTE — Telephone Encounter (Signed)
I spoke with pt. 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 Please advise Dr. Maple Hudson thanks Last OV 08/21/12 Pending 09/19/12

## 2012-09-12 NOTE — Telephone Encounter (Signed)
Spoke with patient, patient is aware note will be left upfront for his pickup. Nothing further at this time.

## 2012-09-12 NOTE — Telephone Encounter (Signed)
ATC patient, no answer LMOMTCB 

## 2012-09-19 ENCOUNTER — Encounter: Payer: Self-pay | Admitting: Internal Medicine

## 2012-09-19 ENCOUNTER — Ambulatory Visit (INDEPENDENT_AMBULATORY_CARE_PROVIDER_SITE_OTHER): Payer: 59

## 2012-09-19 ENCOUNTER — Ambulatory Visit (INDEPENDENT_AMBULATORY_CARE_PROVIDER_SITE_OTHER): Payer: 59 | Admitting: Internal Medicine

## 2012-09-19 VITALS — BP 130/84 | HR 93 | Ht 75.0 in | Wt 163.0 lb

## 2012-09-19 DIAGNOSIS — J309 Allergic rhinitis, unspecified: Secondary | ICD-10-CM

## 2012-09-19 DIAGNOSIS — J4550 Severe persistent asthma, uncomplicated: Secondary | ICD-10-CM

## 2012-09-19 DIAGNOSIS — J45909 Unspecified asthma, uncomplicated: Secondary | ICD-10-CM

## 2012-09-19 NOTE — Progress Notes (Signed)
Patient ID: Drew Sandoval, male    DOB: July 06, 1975, 37 y.o.   MRN: [id number]  HPI 11/04/10- 37 yo former smoker followed for severe asthma, chronic sinusitis, hype-reosinophilia and hyper IgE Sent to me by Dr [name] for allergy evaluation. 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 Allergy profile- 08/11/10 IgE 1,747 with broad specific elevation Last here- August 31, 2010.- note reviewed. Since last here has been in hospital again, getting out 2 weeks ago. Feels well today.  Comes today as planned for allergy skin testing Skin test: Strongly positive especially grass, tree, dust mite  01/04/11- 37 yo former smoker followed for severe asthma, chronic sinusitis, hyper-eosinophilia and hyper IgE.  wife is with him Sent to me by Dr [name] for allergy evaluation. 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 Allergy vaccine was started 11/09/2010 and is building without problems He was still on prednisone when he saw Dr [name] recently and at that time he seemed improved after a recent exacerbation- 2 weeks ago. About 2 weeks after their visit he began coughing again. He hasn't noted any unusual exposure at work, or any sense that he had a cold.  His last IgE was 1747.  He doesn't feel "sick"- just tight and wheezy.  02/18/11-  37 yo former smoker followed for severe asthma, chronic sinusitis, hyper-eosinophilia and hyper IgE.  wife is with him Cooler weather is more comfortable for him. He continues to build allergy vaccine, now at 1:500, without problems. Some days continued to be better than others. 2 days ago thought his eyes were starting to itch and took Benadryl. We discussed comparison antihistamines. Not much sneezing. He has not needed his rescue inhaler in a long time but continues prednisone 20 mg daily. We educated on prednisone again.  04/20/11- 37 yo former smoker followed for severe asthma, chronic sinusitis, hyper-eosinophilia and hyper IgE.  wife is with him Went to ER 3 days ago- got neb and prednisone 60 mg x1. He finished our last taper yesterday. Has had flu shot. Bending over, long walks or climbing stairs, and long work shifts all make him short of breath with wheezing. He worked last night and now feels "65%". He does not think he has an acute cold or infection. He was last really well about 5 days ago. He denies heartburn. He continues building allergy vaccine now at 1:50, without problems. IgE level has been out of range for Xolair.  07/19/11- 37 yo former smoker followed for severe asthma, chronic sinusitis, hyper-eosinophilia and hyper IgE. Separated from wife have now living with his mother. Asthma control is "a whole lot better". He has not missed work. Did have to go to emergency room for kidney stone. Questions allergy vaccine causing "dark spots" on his neck. Using prednisone only occasionally. No routine wheeze, does short of breath at times.  11/18/11- 37 yo former smoker followed for severe asthma, chronic sinusitis, hyper-eosinophilia and hyper IgE. Went to ER for approximately 4 asthma attacks in the past month; He has continued allergy vaccine at 1:50. He is needed his nebulizer 2 or 3 times daily and occasional rescue inhaler. He denies infection or change in workplace exposure. He does not recognize reflux. He usually flares as soon as he finishes the prednisone taper so he and his wife asked about maintenance prednisone which we  discussed. Carefully. Sinus congestion tends to parallel his asthma episodes but he denies purulent nasal discharge or headache. We again discussed the significance of his elevated IgE and eosinophil levels. His IgE has been too high for Xolair but we will reassess. CXR 11/11/11-  IMPRESSION: No active disease. Hyperinflation again noted.  Original Report Authenticated By: Natasha Mead, M.D.    02/07/12- 37 yo former smoker followed for severe asthma, chronic sinusitis, hyper-eosinophilia and hyper IgE. FOLLOWS FOR: post hospital at [facility] 924 through 02/04/2012. DC summary reviewed. Discharge diagnosis asthma. Asthma began during the night. He took his own nebulizer, felt better and tried to go to work but had to leave. He had presented to [facility] ER where he was treated with 3 nebulizer treatments and sent out. He went from there to the [facility] emergency room. Discharged on a steroid taper. They comment in the summary that he was significantly depressed because he had watched his father died of asthma. He admits today that he had not told us of that history before. He feels better now but still wheezing and washed out as he tapers prednisone. He denies any sense of reflux or any recognized triggering experience. In the past he had thought there was something associated with his work place as discussed in earlier notes. He comments today that he can't breathe if he sleeps on his left side. There is no history of heart disease. He quit allergy vaccine in April, 2013.  03/21/12- 37 yo former smoker followed for severe asthma, chronic sinusitis, hyper-eosinophilia and hyper IgE. FOLLOWS FOR: states that breathing is doing good, still gets allergy shots 1:50 GH. He says he is doing well since her last call for prednisone on November 4. He had to Miss work for a day and needed a note. Missed one day last week as well. He says his job is safe and he actually has most asthma flares at home, not at work. Denies any infection now. Tends to wake up in the morning wheezing without any sense of acid reflux. He can usually tell at bedtime if he is going to have trouble during the night or in the morning. [name] is cost prohibitive. Uses nebulizer about twice daily. We are asking for records release so [facility] consent as results of cultures and chest x-ray  report.  05/22/12- 51 yo former smoker followed for severe asthma, chronic sinusitis, hyper-eosinophilia and hyper IgE.  Wife here FOLLOWS FOR: breathing was doing bad earlier today but he took a breathing tx and that helped. denies chest pain,  chest tightness or increased SOB. Last pred yeterday- we did steroid safety talk again. Started feeling tighter last night- blames"getting hot". Used his neb. CXR 02/01/12 IMPRESSION:  Peribronchial thickening and hyperlucency of the lungs compatible  with history of asthma.  No acute abnormalities.  Original Report Authenticated By: Lollie Marrow, M.D.   08/21/12- 7 yo former smoker followed for severe asthma, chronic sinusitis, hyper-eosinophilia and hyper IgE- too high for Xolair.   FOLLOWS FOR: went to hospital in [place] last night-asthmatic bronchitis. Alot of wheezing, cough; was given Zpak(has to get filled today) Has not taken allergy injection in a while due to illness ER visit in [place] last night for exacerbation of asthma again. Treated with antibiotic and still tightly wheezing. He has continued allergy vaccine 1:50 GH with no reactions. He is only clear when he is on prednisone.  09/19/12- 39 yo former smoker followed for severe asthma, chronic  sinusitis, hyper-eosinophilia and hyper IgE- too high for Xolair.   FOLLOWS FOR: pt states he has good and bad days with asthma flare ups-more good than bad though. No Er visits since last here. Not missing work. Not reacting to his allergy shots 1:50 GH.Discussed possibility of moving up to 1:10.   Review of Systems- see HPI Constitutional:   No weight loss, night sweats,  Fevers, chills, fatigue, lassitude. HEENT:   No headaches,  Difficulty swallowing,  Tooth/dental problems,  Sore throat,                No sneezing, ear ache,  + some sniffing, throat itches CV:  No chest pain, orthopnea, PND, swelling in lower extremities, anasarca, dizziness, palpitations GI  No heartburn, indigestion, abdominal  pain, nausea, vomiting,  Resp: + occasional wheeze.  No coughing up of blood.  No change in color of mucus.   Skin: no acute rash GU: no dysuria,  MS:  No joint pain or swelling.   Psych:  No change in mood or affect. No depression or anxiety.  No memory loss.  Objective:   Physical Exam General- Alert, Oriented, Affect-+calm, Distress- none acute;  Tall, trim Skin- clear Lymphadenopathy- none Head- atraumatic            Eyes- Gross vision intact, PERRLA, conjunctivae clear secretions            Ears- Hearing, canals normal            Nose- Clear, no-Septal dev, mucus, polyps, erosion, perforation             Throat- Mallampati II , mucosa clear , drainage- none, tonsils- atrophic Neck- flexible , trachea midline, no stridor , thyroid nl, carotid no bruit Chest - symmetrical excursion , unlabored           Heart/CV- RRR , no murmur , no gallop  , no rub, nl s1 s2                           - JVD- none , edema- none, stasis changes- none, varices- none           Lung-   Very clear, no cough , dullness-none, rub- none, + bilateral wheeze I&E           Chest wall-  Abd-  Br/ Gen/ Rectal- Not done, not indicated Extrem- cyanosis- none, clubbing, none, atrophy- none, strength- nl Neuro- grossly intact to observation

## 2012-09-19 NOTE — Assessment & Plan Note (Signed)
Better currently, probably because he is being compliant with treatment and has avoided colds.  Consider advance vaccine to 1:10- will discuss with lab.

## 2012-09-19 NOTE — Patient Instructions (Addendum)
We can continue allergy vaccine. i will discuss dosing with the allergy lab.  We can continue present meds.  Please call as needed

## 2012-09-26 ENCOUNTER — Ambulatory Visit (INDEPENDENT_AMBULATORY_CARE_PROVIDER_SITE_OTHER): Payer: 59

## 2012-09-26 DIAGNOSIS — J309 Allergic rhinitis, unspecified: Secondary | ICD-10-CM

## 2012-10-03 ENCOUNTER — Ambulatory Visit (INDEPENDENT_AMBULATORY_CARE_PROVIDER_SITE_OTHER): Payer: 59

## 2012-10-03 ENCOUNTER — Telehealth: Payer: Self-pay | Admitting: Internal Medicine

## 2012-10-03 DIAGNOSIS — J309 Allergic rhinitis, unspecified: Secondary | ICD-10-CM

## 2012-10-03 MED ORDER — PREDNISONE 10 MG PO TABS: ORAL_TABLET | ORAL | Status: DC

## 2012-10-03 NOTE — Telephone Encounter (Signed)
Per CY-okay to give Prednisone 10 mg #20 take 4 x 2 days, 3 x 2 days, 2 x 2 days, 1 x 2 days then stop no refills. Pt aware of rx and [facility] [place], [place]. Nothing more needed at this time. Will sign off on message.

## 2012-10-03 NOTE — Telephone Encounter (Signed)
Spoke to pt. Reports SOB, chest tightness and coughing with production of white mucus. Onset was 4 days ago. Denies fever/chills or body aches. Wants prednisone called in.  Last OV 09/19/12 Pending OV 01/22/13  Allergies  Allergen Reactions  . Penicillins Rash    CY - please advise. Thanks.

## 2012-10-10 ENCOUNTER — Ambulatory Visit: Payer: 59

## 2012-10-17 ENCOUNTER — Ambulatory Visit (INDEPENDENT_AMBULATORY_CARE_PROVIDER_SITE_OTHER): Payer: 59

## 2012-10-17 DIAGNOSIS — J309 Allergic rhinitis, unspecified: Secondary | ICD-10-CM

## 2012-10-24 ENCOUNTER — Ambulatory Visit: Payer: 59

## 2012-10-26 ENCOUNTER — Encounter: Payer: Self-pay | Admitting: *Deleted

## 2012-10-26 ENCOUNTER — Telehealth: Payer: Self-pay | Admitting: Internal Medicine

## 2012-10-26 NOTE — Telephone Encounter (Signed)
CY please advise if okay to give letter as requested?

## 2012-10-26 NOTE — Telephone Encounter (Signed)
Per CY-okay to give letter just need to know what 2 days patient was out of work for. Thanks.

## 2012-10-26 NOTE — Telephone Encounter (Signed)
Pt was out of work Tuesday and wed. Letter done and placed on [facility] cart for signature.

## 2012-10-26 NOTE — Telephone Encounter (Signed)
Spoke with patient-aware that letter is done and would like to pick up letter today-at front for pick up. Pt will be here by closing today.

## 2012-10-27 ENCOUNTER — Ambulatory Visit (INDEPENDENT_AMBULATORY_CARE_PROVIDER_SITE_OTHER): Payer: 59

## 2012-10-27 DIAGNOSIS — J309 Allergic rhinitis, unspecified: Secondary | ICD-10-CM

## 2012-10-31 ENCOUNTER — Ambulatory Visit: Payer: 59

## 2012-11-07 ENCOUNTER — Emergency Department (HOSPITAL_COMMUNITY)
Admission: EM | Admit: 2012-11-07 | Discharge: 2012-11-07 | Disposition: A | Discharge status: Home / Self Care | Payer: 59 | Attending: Emergency Medicine | Admitting: Emergency Medicine

## 2012-11-07 ENCOUNTER — Telehealth: Payer: Self-pay | Admitting: Internal Medicine

## 2012-11-07 ENCOUNTER — Encounter (HOSPITAL_COMMUNITY): Payer: Self-pay | Admitting: *Deleted

## 2012-11-07 DIAGNOSIS — R05 Cough: Secondary | ICD-10-CM | POA: Insufficient documentation

## 2012-11-07 DIAGNOSIS — J4541 Moderate persistent asthma with (acute) exacerbation: Secondary | ICD-10-CM

## 2012-11-07 DIAGNOSIS — R1110 Vomiting, unspecified: Secondary | ICD-10-CM | POA: Insufficient documentation

## 2012-11-07 DIAGNOSIS — Z87891 Personal history of nicotine dependence: Secondary | ICD-10-CM | POA: Insufficient documentation

## 2012-11-07 DIAGNOSIS — R059 Cough, unspecified: Secondary | ICD-10-CM | POA: Insufficient documentation

## 2012-11-07 DIAGNOSIS — J029 Acute pharyngitis, unspecified: Secondary | ICD-10-CM | POA: Insufficient documentation

## 2012-11-07 DIAGNOSIS — J45901 Unspecified asthma with (acute) exacerbation: Secondary | ICD-10-CM | POA: Insufficient documentation

## 2012-11-07 DIAGNOSIS — R0789 Other chest pain: Secondary | ICD-10-CM | POA: Insufficient documentation

## 2012-11-07 MED ORDER — IPRATROPIUM BROMIDE 0.02 % IN SOLN
0.5000 mg | Freq: Once | RESPIRATORY_TRACT | Status: AC
  Administered 2012-11-07: 0.5 mg via RESPIRATORY_TRACT
  Filled 2012-11-07: qty 2.5

## 2012-11-07 MED ORDER — PREDNISONE 10 MG PO TABS: ORAL_TABLET | ORAL | Status: DC

## 2012-11-07 MED ORDER — ALBUTEROL SULFATE (5 MG/ML) 0.5% IN NEBU
5.0000 mg | INHALATION_SOLUTION | Freq: Once | RESPIRATORY_TRACT | Status: AC
  Administered 2012-11-07: 5 mg via RESPIRATORY_TRACT
  Filled 2012-11-07: qty 0.5

## 2012-11-07 MED ORDER — PREDNISONE 20 MG PO TABS: ORAL_TABLET | ORAL | Status: DC

## 2012-11-07 MED ORDER — ALBUTEROL SULFATE (5 MG/ML) 0.5% IN NEBU: 2.5000 mg | INHALATION_SOLUTION | Freq: Once | RESPIRATORY_TRACT | Status: DC

## 2012-11-07 NOTE — ED Provider Notes (Signed)
History  This chart was scribed for Ward Givens, MD, by Yevette Edwards, ED Scribe. This patient was seen in room APA04/APA04 and the patient's care was started at 8:24 AM.  CSN: [id number] Arrival date & time 11/07/12  0808  First MD Initiated Contact with Patient 11/07/12 [phone]     Chief Complaint  Patient presents with  . Asthma    The history is provided by the patient. No language interpreter was used.   HPI Comments:  Drew Sandoval is a 37 y.o. male who presents to the Emergency Department complaining of an asthma flare-up which began four days ago. He states the he has experienced a non-productive cough, post-tussive emesis, wheezing, chest tightness, SOB with exertion, and a sore throat which has since resolved. The pt denies a fever, rhinorrhea, and diarrhea. He states that his nebulizer is no longer effective for his asthma. The pt reports that most months, he experiences several asthma attacks. His most recent hospital admission due to asthma occurred last year, and in 2011 he was hospitalized in the ICU and placed on a ventilator due to his asthma. He has a h/o seasonal allergies in addition to his asthma. He is a former smoker, and he admits to occasional alcohol use.   PCP Dr Lowella Bandy Pulmonologist Dr Maple Hudson  Past Medical History  Diagnosis Date  . Asthma   . Seasonal allergies    History reviewed. No pertinent past surgical history. Family History  Problem Relation Age of Onset  . Emphysema Father    History  Substance Use Topics  . Smoking status: Former Smoker -- 0.50 packs/day for 11.5 years    Types: Cigarettes    Quit date: 04/09/2010  . Smokeless tobacco: Never Used     Comment: started at age 53.  1/2 ppd.    . Alcohol Use: No  employed  Review of Systems  Constitutional: Negative for fever.  HENT: Positive for sore throat (Resolved.). Negative for rhinorrhea.   Respiratory: Positive for cough, chest tightness, shortness of breath and wheezing.    Gastrointestinal: Positive for vomiting (Posttussive vomiting ). Negative for nausea and diarrhea.  All other systems reviewed and are negative.   Allergies  Banana and Penicillins  Home Medications   Current Outpatient Rx  Name  Route  Sig  Dispense  Refill  . albuterol (PROVENTIL HFA;VENTOLIN HFA) 108 (90 BASE) MCG/ACT inhaler   Inhalation   Inhale 2 puffs into the lungs every 4 (four) hours as needed for wheezing or shortness of breath.   1 Inhaler   prn   . albuterol (PROVENTIL) (2.5 MG/3ML) 0.083% nebulizer solution   Nebulization   Take 3 mLs (2.5 mg total) by nebulization every 6 (six) hours as needed. For shortness of breath   75 mL   11     Dx-493.90   . ibuprofen (ADVIL,MOTRIN) 200 MG tablet   Oral   Take 200 mg by mouth every 6 (six) hours as needed. Pain         . mometasone-formoterol (DULERA) 200-5 MCG/ACT AERO   Inhalation   Inhale 2 puffs into the lungs 2 (two) times daily.         . montelukast (SINGULAIR) 10 MG tablet   Oral   Take 1 tablet (10 mg total) by mouth at bedtime.   30 tablet   prn    Triage Vitals: BP 152/87  Pulse 82  Temp(Src) 97.8 F (36.6 C)  Resp 24  SpO2 95%  Vital signs  normal    Physical Exam  Nursing note and vitals reviewed. Constitutional: He is oriented to person, place, and time.  Non-toxic appearance. He does not appear ill. No distress.  Tall, thin  HENT:  Head: Normocephalic and atraumatic.  Right Ear: External ear normal.  Left Ear: External ear normal.  Nose: Nose normal. No mucosal edema or rhinorrhea.  Mouth/Throat: Oropharynx is clear and moist and mucous membranes are normal. No dental abscesses or edematous.  Eyes: Conjunctivae and EOM are normal. Pupils are equal, round, and reactive to light.  Neck: Normal range of motion and full passive range of motion without pain. Neck supple.  Cardiovascular: Normal rate, regular rhythm and normal heart sounds.  Exam reveals no gallop and no friction rub.    No murmur heard. Pulmonary/Chest: Effort normal and breath sounds normal. No respiratory distress. He has no wheezes. He has no rhonchi. He has no rales. He exhibits no tenderness and no crepitus.  Has a tight sounding cough during exam. Lungs were clear. Moved good air.   Abdominal: Soft. Normal appearance and bowel sounds are normal. He exhibits no distension. There is no tenderness. There is no rebound and no guarding.  Musculoskeletal: Normal range of motion. He exhibits no edema and no tenderness.  Moves all extremities well.   Neurological: He is alert and oriented to person, place, and time. He has normal strength. No cranial nerve deficit.  Skin: Skin is warm, dry and intact. No rash noted. No erythema. No pallor.  Psychiatric: He has a normal mood and affect. His speech is normal and behavior is normal. His mood appears not anxious.    ED Course  Procedures (including critical care time)  Medications  ipratropium (ATROVENT) nebulizer solution 0.5 mg (0.5 mg Nebulization Given 11/07/12 0913)  albuterol (PROVENTIL) (5 MG/ML) 0.5% nebulizer solution 5 mg (5 mg Nebulization Given 11/07/12 0913)  albuterol (PROVENTIL) (5 MG/ML) 0.5% nebulizer solution 5 mg (5 mg Nebulization Given 11/07/12 1027)  ipratropium (ATROVENT) nebulizer solution 0.5 mg (0.5 mg Nebulization Given 11/07/12 1027)      DIAGNOSTIC STUDIES: Oxygen Saturation is 95% on room air, adequate by my interpretation.    COORDINATION OF CARE:  8:28 AM- Discussed treatment plan with pt which includes a visit by the respiratory therapist, and the pt agreed.   10:10 AM- Rechecked pt and performed a second PE; there was scattered wheezing present.   11:27 AM- Rechecked pt. Before his first nebulizer, his peak flow was 190, and after the treatment his peak flow went up to 260. Before the second nebulizer, his peak flow was 260, and after the treatment his peak flow went up to 290. He has improved air movement, though he has some  rhonchi. The pt reports that post-treatments, he has been coughing up clear sputum.  He feels ready to go home    1. Asthma exacerbation, moderate persistent     Discharge Medication List as of 11/07/2012 11:32 AM    START taking these medications   Details  predniSONE (DELTASONE) 20 MG tablet Take 3 po QD x 3d , then 2 po QD x 3d then 1 po QD x 3d, Print        Plan discharge  [name], MD, FACEP   MDM    I personally performed the services described in this documentation, which was scribed in my presence. The recorded information has been reviewed and considered.  [name], MD, Armando Gang    Ward Givens, MD 11/08/12 [phone]

## 2012-11-07 NOTE — Telephone Encounter (Signed)
Pt was seen in [facility] ED this morning for increased Sob and wheezing for 3 days.  Dry cough. Chest tightness.  Per pt he was given a breathing treatment only.  Pt is taking Dulera bid, Singulair and Albuterol HHN prn at home.  Please advise on what else pt can do.   Last ov: 09-19-12    Next ov: 01-22-13 Allergies  Allergen Reactions  . Banana     Throat sweeling   . Penicillins Rash

## 2012-11-07 NOTE — ED Notes (Signed)
Pt c/o asthma flaring up, non productive cough since Friday, has used his nebulizer and inhaler with minimal improvement in symptoms,

## 2012-11-07 NOTE — Telephone Encounter (Signed)
[facility] [place] [place] [email or web address] to inform pt

## 2012-11-07 NOTE — Telephone Encounter (Signed)
Pt returned call Advised of the recs by Crystal below Pt verbalized his understanding and denied any questions Will sign off

## 2012-11-07 NOTE — Telephone Encounter (Signed)
Per CY-give patient Prednisone 10 mg #20 take 4 x 2 days, 3 x 2 days, 2 x 2 days, 1 x 2 days, then stop no refills.

## 2012-11-17 ENCOUNTER — Ambulatory Visit (INDEPENDENT_AMBULATORY_CARE_PROVIDER_SITE_OTHER): Payer: 59

## 2012-11-17 DIAGNOSIS — J309 Allergic rhinitis, unspecified: Secondary | ICD-10-CM

## 2012-11-24 ENCOUNTER — Ambulatory Visit (INDEPENDENT_AMBULATORY_CARE_PROVIDER_SITE_OTHER): Payer: 59

## 2012-11-24 DIAGNOSIS — J309 Allergic rhinitis, unspecified: Secondary | ICD-10-CM

## 2012-12-01 ENCOUNTER — Ambulatory Visit (INDEPENDENT_AMBULATORY_CARE_PROVIDER_SITE_OTHER): Payer: 59

## 2012-12-01 DIAGNOSIS — J309 Allergic rhinitis, unspecified: Secondary | ICD-10-CM

## 2012-12-08 ENCOUNTER — Ambulatory Visit: Payer: 59

## 2012-12-15 ENCOUNTER — Ambulatory Visit (INDEPENDENT_AMBULATORY_CARE_PROVIDER_SITE_OTHER): Payer: 59

## 2012-12-15 DIAGNOSIS — J309 Allergic rhinitis, unspecified: Secondary | ICD-10-CM

## 2012-12-21 ENCOUNTER — Telehealth: Payer: Self-pay | Admitting: Internal Medicine

## 2012-12-21 MED ORDER — PREDNISONE 10 MG PO TABS: ORAL_TABLET | ORAL | Status: DC

## 2012-12-21 MED ORDER — CLARITHROMYCIN 500 MG PO TABS: 500.0000 mg | ORAL_TABLET | Freq: Two times a day (BID) | ORAL | Status: DC

## 2012-12-21 NOTE — Telephone Encounter (Signed)
Called, spoke with pt -  C/o increased SOB at rest and with exertion, wheezing, dry cough, and chest tightness since Monday.  No f/c/s.  Symptoms started on Monday.  Is using albuterol neb and albuterol hfa with relief for approx 1 hr.  Requesting rx be called in - Dr. Maple Hudson, pls advise.  Thank you.  Last OV with CDY: 09/19/12; was asked to f/u in 4 months Pending OV with CDY: 01/22/13  [facility] [place]  Allergies verified with pt: Allergies  Allergen Reactions  . Banana     Throat sweeling   . Penicillins Rash

## 2012-12-21 NOTE — Telephone Encounter (Signed)
Pt aware of recs. RX's called in. Pt stated he will try to tuff this week.

## 2012-12-21 NOTE — Telephone Encounter (Signed)
Per CY-okay to give Prednisone 10 mg #20 take 4 x 2 days, 3 x 2 days, 2 x 2 days, 1 x 2 days, then stop no refills, IF patient feels he needs an ABX okay to give Biaxin 500 mg #14 take 1 po BID no refills. Also, if patient needs a work note for this episode(flare up) then okay to give.

## 2012-12-22 ENCOUNTER — Ambulatory Visit: Payer: 59

## 2013-01-01 ENCOUNTER — Encounter (HOSPITAL_COMMUNITY): Payer: Self-pay | Admitting: *Deleted

## 2013-01-01 ENCOUNTER — Emergency Department (HOSPITAL_COMMUNITY)
Admission: EM | Admit: 2013-01-01 | Discharge: 2013-01-01 | Disposition: A | Discharge status: Home / Self Care | Payer: 59 | Attending: Emergency Medicine | Admitting: Emergency Medicine

## 2013-01-01 DIAGNOSIS — Z87891 Personal history of nicotine dependence: Secondary | ICD-10-CM | POA: Insufficient documentation

## 2013-01-01 DIAGNOSIS — Z79899 Other long term (current) drug therapy: Secondary | ICD-10-CM | POA: Insufficient documentation

## 2013-01-01 DIAGNOSIS — R059 Cough, unspecified: Secondary | ICD-10-CM | POA: Insufficient documentation

## 2013-01-01 DIAGNOSIS — Z880 Allergy status to penicillin: Secondary | ICD-10-CM | POA: Insufficient documentation

## 2013-01-01 DIAGNOSIS — R05 Cough: Secondary | ICD-10-CM | POA: Insufficient documentation

## 2013-01-01 DIAGNOSIS — J45909 Unspecified asthma, uncomplicated: Secondary | ICD-10-CM

## 2013-01-01 DIAGNOSIS — R0789 Other chest pain: Secondary | ICD-10-CM | POA: Insufficient documentation

## 2013-01-01 DIAGNOSIS — J45901 Unspecified asthma with (acute) exacerbation: Secondary | ICD-10-CM | POA: Insufficient documentation

## 2013-01-01 MED ORDER — ALBUTEROL SULFATE (5 MG/ML) 0.5% IN NEBU
5.0000 mg | INHALATION_SOLUTION | Freq: Once | RESPIRATORY_TRACT | Status: AC
  Administered 2013-01-01: 5 mg via RESPIRATORY_TRACT
  Filled 2013-01-01: qty 1

## 2013-01-01 MED ORDER — PREDNISONE 20 MG PO TABS: ORAL_TABLET | ORAL | Status: DC

## 2013-01-01 MED ORDER — IPRATROPIUM BROMIDE 0.02 % IN SOLN
0.5000 mg | Freq: Once | RESPIRATORY_TRACT | Status: AC
  Administered 2013-01-01: 0.5 mg via RESPIRATORY_TRACT
  Filled 2013-01-01: qty 2.5

## 2013-01-01 MED ORDER — PREDNISONE 50 MG PO TABS
60.0000 mg | ORAL_TABLET | Freq: Once | ORAL | Status: AC
  Administered 2013-01-01: 60 mg via ORAL
  Filled 2013-01-01: qty 1

## 2013-01-01 NOTE — ED Provider Notes (Signed)
CSN: [id number]     Arrival date & time 01/01/13  0708 History  This chart was scribed for Donnetta Hutching, MD by Leone Payor, ED Scribe. This patient was seen in room APA14/APA14 and the patient's care was started 7:20 AM.    Chief Complaint  Patient presents with  . Asthma    The history is provided by the patient. No language interpreter was used.   HPI Comments: Drew Sandoval is a 37 y.o. male with past medical history of asthma who presents to the Emergency Department complaining of a new episode of asthma that began about 1 week ago. Pt has associated, intermittent dry cough, SOB, and chest tightness. He has used his MDI inhaler (uses about 4 per day) and nebulizer (3 times per day) with minimal relief. He reports having a breathing treatment and prednisone that has been helpful for him in the past. He explains that his asthma flares up mostly with increased physical activity.   Pt is a former smoker but denies alcohol use.  PCP Dr. Parke Simmers  Past Medical History  Diagnosis Date  . Asthma   . Seasonal allergies    No past surgical history on file. Family History  Problem Relation Age of Onset  . Emphysema Father    History  Substance Use Topics  . Smoking status: Former Smoker -- 0.50 packs/day for 11.5 years    Types: Cigarettes    Quit date: 04/09/2010  . Smokeless tobacco: Never Used     Comment: started at age 80.  1/2 ppd.    . Alcohol Use: No    Review of Systems A complete 10 system review of systems was obtained and all systems are negative except as noted in the HPI and PMH.   Allergies  Banana and Penicillins  Home Medications   Current Outpatient Rx  Name  Route  Sig  Dispense  Refill  . albuterol (PROVENTIL HFA;VENTOLIN HFA) 108 (90 BASE) MCG/ACT inhaler   Inhalation   Inhale 2 puffs into the lungs every 4 (four) hours as needed for wheezing or shortness of breath.   1 Inhaler   prn   . albuterol (PROVENTIL) (2.5 MG/3ML) 0.083% nebulizer solution    Nebulization   Take 3 mLs (2.5 mg total) by nebulization every 6 (six) hours as needed. For shortness of breath   75 mL   11     Dx-493.90   . clarithromycin (BIAXIN) 500 MG tablet   Oral   Take 1 tablet (500 mg total) by mouth 2 (two) times daily.   14 tablet   0   . ibuprofen (ADVIL,MOTRIN) 200 MG tablet   Oral   Take 200 mg by mouth every 6 (six) hours as needed. Pain         . mometasone-formoterol (DULERA) 200-5 MCG/ACT AERO   Inhalation   Inhale 2 puffs into the lungs 2 (two) times daily.         . montelukast (SINGULAIR) 10 MG tablet   Oral   Take 1 tablet (10 mg total) by mouth at bedtime.   30 tablet   prn   . predniSONE (DELTASONE) 10 MG tablet      take 4 x 2 days, 3 x 2 days, 2 x 2 days, 1 x 2 days, then stop   20 tablet   0   . predniSONE (DELTASONE) 20 MG tablet      Take 3 po QD x 3d , then 2 po QD x 3d then  1 po QD x 3d   18 tablet   0    There were no vitals taken for this visit. Physical Exam  Nursing note and vitals reviewed. Constitutional: He is oriented to person, place, and time. He appears well-developed and well-nourished.  HENT:  Head: Normocephalic and atraumatic.  Eyes: Conjunctivae and EOM are normal. Pupils are equal, round, and reactive to light.  Neck: Normal range of motion. Neck supple.  Cardiovascular: Normal rate, regular rhythm and normal heart sounds.   Pulmonary/Chest: Effort normal. He has wheezes (Minimal expiratory wheezing. ).  Abdominal: Soft. Bowel sounds are normal.  Musculoskeletal: Normal range of motion.  Neurological: He is alert and oriented to person, place, and time.  Skin: Skin is warm and dry.  Psychiatric: He has a normal mood and affect.    ED Course  DIAGNOSTIC STUDIES: Oxygen Saturation is 94% on RA, adequate by my interpretation.    COORDINATION OF CARE: 7:36 AM Discussed treatment plan with pt at bedside and pt agreed to plan. Will order breathing treatment and prednisone.   Procedures  (including critical care time)  Labs Reviewed - No data to display No results found. No diagnosis found.  MDM  Patient feels better after breathing treatment of albuterol and Atrovent. Discharge meds prednisone.  I personally performed the services described in this documentation, which was scribed in my presence. The recorded information has been reviewed and is accurate.   Donnetta Hutching, MD 01/01/13 [phone]

## 2013-01-01 NOTE — ED Notes (Signed)
Last used nebulizer and inhalers before work last night.  Recently completed course of steroids Rx by Dr. Maple Hudson.  States he "just feels tight" in anterior chest.

## 2013-01-01 NOTE — ED Notes (Signed)
Patient with no complaints at this time. Respirations even and unlabored. Skin warm/dry. Discharge instructions reviewed with patient at this time. Patient given opportunity to voice concerns/ask questions. Patient discharged at this time and left Emergency Department with steady gait.   

## 2013-01-01 NOTE — ED Notes (Addendum)
Asthma flare up x 1 week. Pt states dry cough and sob at times. NAD at this time. No audible wheezing at this time.

## 2013-01-05 ENCOUNTER — Ambulatory Visit (INDEPENDENT_AMBULATORY_CARE_PROVIDER_SITE_OTHER): Payer: 59

## 2013-01-05 DIAGNOSIS — J309 Allergic rhinitis, unspecified: Secondary | ICD-10-CM

## 2013-01-11 ENCOUNTER — Encounter (HOSPITAL_COMMUNITY): Payer: Self-pay | Admitting: Emergency Medicine

## 2013-01-11 ENCOUNTER — Emergency Department (HOSPITAL_COMMUNITY): Payer: 59

## 2013-01-11 ENCOUNTER — Emergency Department (HOSPITAL_COMMUNITY)
Admission: EM | Admit: 2013-01-11 | Discharge: 2013-01-11 | Disposition: A | Discharge status: Home / Self Care | Payer: 59 | Attending: Emergency Medicine | Admitting: Emergency Medicine

## 2013-01-11 DIAGNOSIS — R0789 Other chest pain: Secondary | ICD-10-CM

## 2013-01-11 DIAGNOSIS — IMO0002 Reserved for concepts with insufficient information to code with codable children: Secondary | ICD-10-CM | POA: Insufficient documentation

## 2013-01-11 DIAGNOSIS — Z79899 Other long term (current) drug therapy: Secondary | ICD-10-CM | POA: Insufficient documentation

## 2013-01-11 DIAGNOSIS — R071 Chest pain on breathing: Secondary | ICD-10-CM | POA: Insufficient documentation

## 2013-01-11 DIAGNOSIS — Z880 Allergy status to penicillin: Secondary | ICD-10-CM | POA: Insufficient documentation

## 2013-01-11 DIAGNOSIS — Z87891 Personal history of nicotine dependence: Secondary | ICD-10-CM | POA: Insufficient documentation

## 2013-01-11 DIAGNOSIS — J45909 Unspecified asthma, uncomplicated: Secondary | ICD-10-CM | POA: Insufficient documentation

## 2013-01-11 LAB — CBC WITH DIFFERENTIAL/PLATELET
Basophils Absolute: 0.1 10*3/uL (ref 0.0–0.1)
Basophils Relative: 1 % (ref 0–1)
Eosinophils Absolute: 0.6 10*3/uL (ref 0.0–0.7)
Eosinophils Relative: 6 % — ABNORMAL HIGH (ref 0–5)
HCT: 42.6 % (ref 39.0–52.0)
MCHC: 34.5 g/dL (ref 30.0–36.0)
MCV: 89.9 fL (ref 78.0–100.0)
Monocytes Absolute: 0.8 10*3/uL (ref 0.1–1.0)
RDW: 13.4 % (ref 11.5–15.5)

## 2013-01-11 LAB — BASIC METABOLIC PANEL
Calcium: 9.5 mg/dL (ref 8.4–10.5)
Creatinine, Ser: 0.84 mg/dL (ref 0.50–1.35)
GFR calc Af Amer: >90 mL/min (ref >90)

## 2013-01-11 LAB — TROPONIN I: Troponin I: <0.3 ng/mL (ref <0.30)

## 2013-01-11 MED ORDER — NAPROXEN 250 MG PO TABS: 250.0000 mg | ORAL_TABLET | Freq: Two times a day (BID) | ORAL | Status: DC

## 2013-01-11 MED ORDER — METHOCARBAMOL 500 MG PO TABS: 1000.0000 mg | ORAL_TABLET | Freq: Four times a day (QID) | ORAL | Status: DC | PRN

## 2013-01-11 MED ORDER — HYDROCODONE-ACETAMINOPHEN 5-325 MG PO TABS: ORAL_TABLET | ORAL | Status: DC

## 2013-01-11 MED ORDER — OXYCODONE-ACETAMINOPHEN 5-325 MG PO TABS
2.0000 | ORAL_TABLET | Freq: Once | ORAL | Status: AC
  Administered 2013-01-11: 2 via ORAL
  Filled 2013-01-11: qty 2

## 2013-01-11 MED ORDER — IBUPROFEN 400 MG PO TABS
400.0000 mg | ORAL_TABLET | Freq: Once | ORAL | Status: AC
  Administered 2013-01-11: 400 mg via ORAL
  Filled 2013-01-11: qty 1

## 2013-01-11 NOTE — ED Notes (Signed)
Patient informed that he cannot drive home after the administration of percocet. Patient verbalized understanding and stated would have someone to pick him up if he is discharged.

## 2013-01-11 NOTE — ED Notes (Signed)
Patient complaining of right-sided chest pain that started today. States constant pain with intermittent sharp pains, worse with deep inspiration.

## 2013-01-11 NOTE — ED Provider Notes (Signed)
CSN: [id number]     Arrival date & time 01/11/13  7829 History   First MD Initiated Contact with Patient 01/11/13 1024     Chief Complaint  Patient presents with  . Chest Pain    HPI Pt was seen at 1055. Per pt, c/o gradual onset and persistence of constant right sided chest wall "pain" that began overnight last night. Describes the pain as "sharp," worsens with deep breath, palpation of the area and body position changes. Denies palpitations, no SOB/cough, no wheezing, no rash, no fevers, no abd pain, no N/V/D, no back pain, no injury.     Past Medical History  Diagnosis Date  . Asthma   . Seasonal allergies    History reviewed. No pertinent past surgical history.  Family History  Problem Relation Age of Onset  . Emphysema Father    History  Substance Use Topics  . Smoking status: Former Smoker -- 0.50 packs/day for 11.5 years    Types: Cigarettes    Quit date: 04/09/2010  . Smokeless tobacco: Never Used     Comment: started at age 75.  1/2 ppd.    . Alcohol Use: No    Review of Systems ROS: Statement: All systems negative except as marked or noted in the HPI; Constitutional: Negative for fever and chills. ; ; Eyes: Negative for eye pain, redness and discharge. ; ; ENMT: Negative for ear pain, hoarseness, nasal congestion, sinus pressure and sore throat. ; ; Cardiovascular: +CP. Negative for palpitations, diaphoresis, dyspnea and peripheral edema. ; ; Respiratory: Negative for cough, wheezing and stridor. ; ; Gastrointestinal: Negative for nausea, vomiting, diarrhea, abdominal pain, blood in stool, hematemesis, jaundice and rectal bleeding. . ; ; Genitourinary: Negative for dysuria, flank pain and hematuria. ; ; Musculoskeletal: Negative for back pain and neck pain. Negative for swelling and trauma.; ; Skin: Negative for pruritus, rash, abrasions, blisters, bruising and skin lesion.; ; Neuro: Negative for headache, lightheadedness and neck stiffness. Negative for weakness, altered  level of consciousness , altered mental status, extremity weakness, paresthesias, involuntary movement, seizure and syncope.       Allergies  Banana and Penicillins  Home Medications   Current Outpatient Rx  Name  Route  Sig  Dispense  Refill  . albuterol (PROVENTIL HFA;VENTOLIN HFA) 108 (90 BASE) MCG/ACT inhaler   Inhalation   Inhale 2 puffs into the lungs every 4 (four) hours as needed for wheezing or shortness of breath.   1 Inhaler   prn   . mometasone-formoterol (DULERA) 200-5 MCG/ACT AERO   Inhalation   Inhale 2 puffs into the lungs 2 (two) times daily.         . montelukast (SINGULAIR) 10 MG tablet   Oral   Take 1 tablet (10 mg total) by mouth at bedtime.   30 tablet   prn   . predniSONE (DELTASONE) 20 MG tablet      Take 3 po QD x 3d , then 2 po QD x 3d then 1 po QD x 3d   18 tablet   0   . albuterol (PROVENTIL) (2.5 MG/3ML) 0.083% nebulizer solution   Nebulization   Take 3 mLs (2.5 mg total) by nebulization every 6 (six) hours as needed. For shortness of breath   75 mL   11     Dx-493.90   . ibuprofen (ADVIL,MOTRIN) 200 MG tablet   Oral   Take 200 mg by mouth every 6 (six) hours as needed. Pain  BP 132/95  Pulse 86  Temp(Src) 98.2 F (36.8 C) (Oral)  Resp 18  Ht 6\' 3"  (1.905 m)  Wt 160 lb (72.576 kg)  BMI 20 kg/m2  SpO2 96% Physical Exam 1100: Physical examination:  Nursing notes reviewed; Vital signs and O2 SAT reviewed;  Constitutional: Well developed, Well nourished, Well hydrated, In no acute distress; Head:  Normocephalic, atraumatic; Eyes: EOMI, PERRL, No scleral icterus; ENMT: Mouth and pharynx normal, Mucous membranes moist; Neck: Supple, Full range of motion, No lymphadenopathy; Cardiovascular: Regular rate and rhythm, No murmur, rub, or gallop; Respiratory: Breath sounds clear & equal bilaterally, No rales, rhonchi, wheezes.  Speaking full sentences with ease, Normal respiratory effort/excursion; Chest: +TTP right parasternal  and anterior chest wall areas which reproduces pt's pain. No rash. No deformity. Movement normal; Abdomen: Soft, Nontender, Nondistended, Normal bowel sounds; Genitourinary: No CVA tenderness; Extremities: Pulses normal, No tenderness, No edema, No calf edema or asymmetry.; Neuro: AA&Ox3, Major CN grossly intact.  Speech clear. No gross focal motor or sensory deficits in extremities.; Skin: Color normal, Warm, Dry.   ED Course  Procedures    MDM  MDM Reviewed: previous chart, nursing note and vitals Reviewed previous: labs and ECG Interpretation: labs, ECG and x-ray       Date: 01/11/2013  Rate: 72  Rhythm: normal sinus rhythm  QRS Axis: normal  Intervals: normal  ST/T Wave abnormalities: normal  Conduction Disutrbances:none  Narrative Interpretation:   Old EKG Reviewed: unchanged; no significant changes from previous EKG dated 04/11/2011.  Results for orders placed during the hospital encounter of 01/11/13  CBC WITH DIFFERENTIAL      Result Value Range   WBC 10.7 (*) 4.0 - 10.5 K/uL   RBC 4.74  4.22 - 5.81 MIL/uL   Hemoglobin 14.7  13.0 - 17.0 g/dL   HCT 62.9  52.8 - 41.3 %   MCV 89.9  78.0 - 100.0 fL   MCH 31.0  26.0 - 34.0 pg   MCHC 34.5  30.0 - 36.0 g/dL   RDW 24.4  01.0 - 27.2 %   Platelets 182  150 - 400 K/uL   Neutrophils Relative % 50  43 - 77 %   Neutro Abs 5.4  1.7 - 7.7 K/uL   Lymphocytes Relative 36  12 - 46 %   Lymphs Abs 3.9  0.7 - 4.0 K/uL   Monocytes Relative 8  3 - 12 %   Monocytes Absolute 0.8  0.1 - 1.0 K/uL   Eosinophils Relative 6 (*) 0 - 5 %   Eosinophils Absolute 0.6  0.0 - 0.7 K/uL   Basophils Relative 1  0 - 1 %   Basophils Absolute 0.1  0.0 - 0.1 K/uL  BASIC METABOLIC PANEL      Result Value Range   Sodium 142  135 - 145 mEq/L   Potassium 3.6  3.5 - 5.1 mEq/L   Chloride 105  96 - 112 mEq/L   CO2 29  19 - 32 mEq/L   Glucose, Bld 80  70 - 99 mg/dL   BUN 14  6 - 23 mg/dL   Creatinine, Ser 5.36  0.50 - 1.35 mg/dL   Calcium 9.5  8.4 - 64.4  mg/dL   GFR calc non Af Amer >90  >90 mL/min   GFR calc Af Amer >90  >90 mL/min  TROPONIN I      Result Value Range   Troponin I <0.30  <0.30 ng/mL  D-DIMER, QUANTITATIVE  Result Value Range   D-Dimer, Quant <0.27  0.00 - 0.48 ug/mL-FEU   Dg Chest 2 View 01/11/2013   CLINICAL DATA:  Right-sided pleuritic chest pain. Asthma.  EXAM: CHEST  2 VIEW  COMPARISON:  02/01/2012  FINDINGS: The heart size and mediastinal contours are within normal limits. Both lungs are clear. The visualized skeletal structures are unremarkable.  IMPRESSION: No active cardiopulmonary disease.   Electronically Signed   By: Myles Rosenthal   On: 01/11/2013 11:23    1300:  Workup reassuring. Will tx symptomatically for msk pain. Dx and testing d/w pt.  Questions answered.  Verb understanding, agreeable to d/c home with outpt f/u.     Laray Anger, DO 01/14/13 1156

## 2013-01-12 ENCOUNTER — Ambulatory Visit: Payer: 59

## 2013-01-19 ENCOUNTER — Ambulatory Visit (INDEPENDENT_AMBULATORY_CARE_PROVIDER_SITE_OTHER): Payer: 59

## 2013-01-19 DIAGNOSIS — J309 Allergic rhinitis, unspecified: Secondary | ICD-10-CM

## 2013-01-22 ENCOUNTER — Encounter: Payer: Self-pay | Admitting: Internal Medicine

## 2013-01-22 ENCOUNTER — Ambulatory Visit (INDEPENDENT_AMBULATORY_CARE_PROVIDER_SITE_OTHER): Payer: 59 | Admitting: Internal Medicine

## 2013-01-22 VITALS — BP 120/70 | HR 97 | Ht 75.0 in | Wt 150.0 lb

## 2013-01-22 DIAGNOSIS — D71 Functional disorders of polymorphonuclear neutrophils: Secondary | ICD-10-CM

## 2013-01-22 DIAGNOSIS — J4550 Severe persistent asthma, uncomplicated: Secondary | ICD-10-CM

## 2013-01-22 DIAGNOSIS — D824 Hyperimmunoglobulin E [IgE] syndrome: Secondary | ICD-10-CM

## 2013-01-22 DIAGNOSIS — Z23 Encounter for immunization: Secondary | ICD-10-CM

## 2013-01-22 DIAGNOSIS — J45909 Unspecified asthma, uncomplicated: Secondary | ICD-10-CM

## 2013-01-22 MED ORDER — CICLESONIDE 160 MCG/ACT IN AERS: 2.0000 | INHALATION_SPRAY | Freq: Two times a day (BID) | RESPIRATORY_TRACT | Status: DC

## 2013-01-22 MED ORDER — UMECLIDINIUM-VILANTEROL 62.5-25 MCG/INH IN AEPB: 1.0000 | INHALATION_SPRAY | Freq: Every day | RESPIRATORY_TRACT | Status: DC

## 2013-01-22 NOTE — Progress Notes (Signed)
Patient ID: Drew PavlovMarshall Sandoval, male    DOB: July 06, 1975, 37 y.o.   MRN: [id number]  HPI 11/04/10- 37 yo former smoker followed for severe asthma, chronic sinusitis, hype-reosinophilia and hyper IgE Sent to me by Dr [name] for allergy evaluation. 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 Allergy profile- 08/11/10 IgE 1,747 with broad specific elevation Last here- August 31, 2010.- note reviewed. Since last here has been in hospital again, getting out 2 weeks ago. Feels well today.  Comes today as planned for allergy skin testing Skin test: Strongly positive especially grass, tree, dust mite  01/04/11- 89 yo former smoker followed for severe asthma, chronic sinusitis, hyper-eosinophilia and hyper IgE.  wife is with him Sent to me by Dr [name] for allergy evaluation. 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 Allergy vaccine was started 11/09/2010 and is building without problems He was still on prednisone when he saw Dr [name] recently and at that time he seemed improved after a recent exacerbation- 2 weeks ago. About 2 weeks after their visit he began coughing again. He hasn't noted any unusual exposure at work, or any sense that he had a cold.  His last IgE was 1747.  He doesn't feel "sick"- just tight and wheezy.  02/18/11-  37 yo former smoker followed for severe asthma, chronic sinusitis, hyper-eosinophilia and hyper IgE.  wife is with him Cooler weather is more comfortable for him. He continues to build allergy vaccine, now at 1:500, without problems. Some days continued to be better than others. 2 days ago thought his eyes were starting to itch and took Benadryl. We discussed comparison antihistamines. Not much sneezing. He has not needed his rescue inhaler in a long time but continues prednisone 20 mg daily. We educated on prednisone again.  04/20/11- 37  yo former smoker followed for severe asthma, chronic sinusitis, hyper-eosinophilia and hyper IgE.  wife is with him Went to ER 3 days ago- got neb and prednisone 60 mg x1. He finished our last taper yesterday. Has had flu shot. Bending over, long walks or climbing stairs, and long work shifts all make him short of breath with wheezing. He worked last night and now feels "65%". He does not think he has an acute cold or infection. He was last really well about 5 days ago. He denies heartburn. He continues building allergy vaccine now at 1:50, without problems. IgE level has been out of range for Xolair.  07/19/11- 37 yo former smoker followed for severe asthma, chronic sinusitis, hyper-eosinophilia and hyper IgE. Separated from wife have now living with his mother. Asthma control is "a whole lot better". He has not missed work. Did have to go to emergency room for kidney stone. Questions allergy vaccine causing "dark spots" on his neck. Using prednisone only occasionally. No routine wheeze, does short of breath at times.  11/18/11- 37 yo former smoker followed for severe asthma, chronic sinusitis, hyper-eosinophilia and hyper IgE. Went to ER for approximately 4 asthma attacks in the past month; He has continued allergy vaccine at 1:50. He is needed his nebulizer 2 or 3 times daily and occasional rescue inhaler. He denies infection or change in workplace exposure. He does not recognize reflux. He usually flares as soon as he finishes the prednisone taper so he and his wife asked about maintenance prednisone which we  discussed. Carefully. Sinus congestion tends to parallel his asthma episodes but he denies purulent nasal discharge or headache. We again discussed the significance of his elevated IgE and eosinophil levels. His IgE has been too high for Xolair but we will reassess. CXR 11/11/11-  IMPRESSION: No active disease. Hyperinflation again noted.  Original Report Authenticated By: Natasha Mead, M.D.    02/07/12- 37 yo former smoker followed for severe asthma, chronic sinusitis, hyper-eosinophilia and hyper IgE. FOLLOWS FOR: post hospital at [facility] 924 through 02/04/2012. DC summary reviewed. Discharge diagnosis asthma. Asthma began during the night. He took his own nebulizer, felt better and tried to go to work but had to leave. He had presented to [facility] ER where he was treated with 3 nebulizer treatments and sent out. He went from there to the [facility] emergency room. Discharged on a steroid taper. They comment in the summary that he was significantly depressed because he had watched his father died of asthma. He admits today that he had not told us of that history before. He feels better now but still wheezing and washed out as he tapers prednisone. He denies any sense of reflux or any recognized triggering experience. In the past he had thought there was something associated with his work place as discussed in earlier notes. He comments today that he can't breathe if he sleeps on his left side. There is no history of heart disease. He quit allergy vaccine in April, 2013.  03/21/12- 37 yo former smoker followed for severe asthma, chronic sinusitis, hyper-eosinophilia and hyper IgE. FOLLOWS FOR: states that breathing is doing good, still gets allergy shots 1:50 GH. He says he is doing well since her last call for prednisone on November 4. He had to Miss work for a day and needed a note. Missed one day last week as well. He says his job is safe and he actually has most asthma flares at home, not at work. Denies any infection now. Tends to wake up in the morning wheezing without any sense of acid reflux. He can usually tell at bedtime if he is going to have trouble during the night or in the morning. [name] is cost prohibitive. Uses nebulizer about twice daily. We are asking for records release so [facility] consent as results of cultures and chest x-ray  report.  05/22/12- 51 yo former smoker followed for severe asthma, chronic sinusitis, hyper-eosinophilia and hyper IgE.  Wife here FOLLOWS FOR: breathing was doing bad earlier today but he took a breathing tx and that helped. denies chest pain,  chest tightness or increased SOB. Last pred yeterday- we did steroid safety talk again. Started feeling tighter last night- blames"getting hot". Used his neb. CXR 02/01/12 IMPRESSION:  Peribronchial thickening and hyperlucency of the lungs compatible  with history of asthma.  No acute abnormalities.  Original Report Authenticated By: Lollie Marrow, M.D.   08/21/12- 7 yo former smoker followed for severe asthma, chronic sinusitis, hyper-eosinophilia and hyper IgE- too high for Xolair.   FOLLOWS FOR: went to hospital in [place] last night-asthmatic bronchitis. Alot of wheezing, cough; was given Zpak(has to get filled today) Has not taken allergy injection in a while due to illness ER visit in [place] last night for exacerbation of asthma again. Treated with antibiotic and still tightly wheezing. He has continued allergy vaccine 1:50 GH with no reactions. He is only clear when he is on prednisone.  09/19/12- 39 yo former smoker followed for severe asthma, chronic  sinusitis, hyper-eosinophilia and hyper IgE- too high for Xolair.   FOLLOWS FOR: pt states he has good and bad days with asthma flare ups-more good than bad though. No Er visits since last here. Not missing work. Not reacting to his allergy shots 1:50 GH.Discussed possibility of moving up to 1:10.   01/22/13- 49 yo former smoker followed for severe asthma, chronic sinusitis, hyper-eosinophilia and hyper IgE- too high for Xolair.  FOLLOWS FOR:  Waking up in nights and having asthma attacks x1 month. Also, having some snoring Tell him he has apnea. Wheezing is worse at night. Denies reflux and denies infection. Says he now feels "fine" at work.   Review of Systems- see HPI Constitutional:   No weight  loss, night sweats,  Fevers, chills, fatigue, lassitude. HEENT:   No headaches,  Difficulty swallowing,  Tooth/dental problems,  Sore throat,                No sneezing, ear ache,  + some sniffing, throat itches CV:  No chest pain, orthopnea, PND, swelling in lower extremities, anasarca, dizziness, palpitations GI  No heartburn, indigestion, abdominal pain, nausea, vomiting,  Resp: +  wheeze.  No coughing up of blood.  No change in color of mucus.   Skin: no acute rash GU: no dysuria,  MS:  No joint pain or swelling.   Psych:  No change in mood or affect. No depression or anxiety.  No memory loss.  Objective:   Physical Exam General- Alert, Oriented, Affect-+calm, Distress- none acute;  Tall, trim Skin- clear Lymphadenopathy- none Head- atraumatic            Eyes- Gross vision intact, PERRLA, conjunctivae clear secretions            Ears- Hearing, canals normal            Nose- Clear, no-Septal dev, mucus, polyps, erosion, perforation             Throat- Mallampati II , mucosa clear , drainage- none, tonsils- atrophic Neck- flexible , trachea midline, no stridor , thyroid nl, carotid no bruit Chest - symmetrical excursion , unlabored           Heart/CV- RRR , no murmur , no gallop  , no rub, nl s1 s2                           - JVD- none , edema- none, stasis changes- none, varices- none           Lung-   Very clear, no cough , dullness-none, rub- none, + bibasilar wheeze I&E           Chest wall-  Abd-  Br/ Gen/ Rectal- Not done, not indicated Extrem- cyanosis- none, clubbing, none, atrophy- none, strength- nl Neuro- grossly intact to observation

## 2013-01-22 NOTE — Patient Instructions (Addendum)
Try sample Anoro Ellipta (#2)   1 puff, ONE TIME DAILY         Sample Alvesco inhaler (#2)   2 puffs then rinse mouth, twice daily   Try these instead of Dulera. When the samples run out, go back to [employer]

## 2013-01-26 ENCOUNTER — Ambulatory Visit: Payer: 59

## 2013-01-28 NOTE — Assessment & Plan Note (Signed)
He is not recognizing his work exposure as a trigger for his wheezing now. We discussed medication options. Plan-working with samples for trial, Anoro and Alvesco 160, flu vaccine

## 2013-02-02 ENCOUNTER — Ambulatory Visit (INDEPENDENT_AMBULATORY_CARE_PROVIDER_SITE_OTHER): Payer: 59

## 2013-02-02 DIAGNOSIS — J309 Allergic rhinitis, unspecified: Secondary | ICD-10-CM

## 2013-02-05 ENCOUNTER — Ambulatory Visit (INDEPENDENT_AMBULATORY_CARE_PROVIDER_SITE_OTHER): Payer: 59

## 2013-02-05 DIAGNOSIS — J309 Allergic rhinitis, unspecified: Secondary | ICD-10-CM

## 2013-02-09 ENCOUNTER — Ambulatory Visit (INDEPENDENT_AMBULATORY_CARE_PROVIDER_SITE_OTHER): Payer: 59

## 2013-02-09 ENCOUNTER — Other Ambulatory Visit: Payer: Self-pay | Admitting: Internal Medicine

## 2013-02-09 ENCOUNTER — Telehealth: Payer: Self-pay | Admitting: Internal Medicine

## 2013-02-09 DIAGNOSIS — J309 Allergic rhinitis, unspecified: Secondary | ICD-10-CM

## 2013-02-09 MED ORDER — PREDNISONE 10 MG PO TABS: ORAL_TABLET | ORAL | Status: DC

## 2013-02-09 NOTE — Telephone Encounter (Signed)
Drew Sandoval and wife walked in to report few days of itching rash, mild chest tightness. He questions samples of Anoro and Alvesco given last ov. Exam- Unlabored. Excoriated eczematous areas on back and neck, conjunctival injection L eye. No evident wheeze. Imp- Possibly reaction to one of sample meds. Had also gotten flu vax. Plan- D/C Anoro and Alvesco. Prednisone 10 mg sent to [facility] to take 10 mg daily x 7 days to control rash and asthma. Call then for update.

## 2013-02-16 ENCOUNTER — Ambulatory Visit (INDEPENDENT_AMBULATORY_CARE_PROVIDER_SITE_OTHER): Payer: 59

## 2013-02-16 ENCOUNTER — Telehealth: Payer: Self-pay | Admitting: Internal Medicine

## 2013-02-16 DIAGNOSIS — J309 Allergic rhinitis, unspecified: Secondary | ICD-10-CM

## 2013-02-16 NOTE — Telephone Encounter (Signed)
I spoke with pt. He stated the prednisone Dr. Maple Hudson called in helped clear up his rash and it helped with his asthma. This is just FYI and does not need a call back

## 2013-02-16 NOTE — Telephone Encounter (Signed)
We will need to explain to him that Medicare is requiring a new sleep study to cover for his CPAP machine.   Order- Split protocol NPSG for dx OSA Thanks

## 2013-02-16 NOTE — Telephone Encounter (Signed)
Information entered by CY in wrong chart.

## 2013-02-23 ENCOUNTER — Ambulatory Visit (INDEPENDENT_AMBULATORY_CARE_PROVIDER_SITE_OTHER): Payer: 59

## 2013-02-23 DIAGNOSIS — J309 Allergic rhinitis, unspecified: Secondary | ICD-10-CM

## 2013-03-02 ENCOUNTER — Ambulatory Visit: Payer: 59

## 2013-03-05 ENCOUNTER — Encounter: Payer: Self-pay | Admitting: Internal Medicine

## 2013-03-05 ENCOUNTER — Ambulatory Visit (INDEPENDENT_AMBULATORY_CARE_PROVIDER_SITE_OTHER): Payer: 59 | Admitting: Internal Medicine

## 2013-03-05 VITALS — BP 108/62 | HR 94 | Ht 75.0 in | Wt 152.8 lb

## 2013-03-05 DIAGNOSIS — J45909 Unspecified asthma, uncomplicated: Secondary | ICD-10-CM

## 2013-03-05 DIAGNOSIS — J4550 Severe persistent asthma, uncomplicated: Secondary | ICD-10-CM

## 2013-03-05 MED ORDER — EPINEPHRINE 0.3 MG/0.3ML IJ SOAJ: INTRAMUSCULAR | Status: DC

## 2013-03-05 NOTE — Progress Notes (Signed)
Patient ID: Drew Sandoval, male    DOB: July 06, 1975, 37 y.o.   MRN: [id number]  HPI 11/04/10- 37 yo former smoker followed for severe asthma, chronic sinusitis, hype-reosinophilia and hyper IgE Sent to me by Dr [name] for allergy evaluation. 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 Allergy profile- 08/11/10 IgE 1,747 with broad specific elevation Last here- August 31, 2010.- note reviewed. Since last here has been in hospital again, getting out 2 weeks ago. Feels well today.  Comes today as planned for allergy skin testing Skin test: Strongly positive especially grass, tree, dust mite  01/04/11- 89 yo former smoker followed for severe asthma, chronic sinusitis, hyper-eosinophilia and hyper IgE.  wife is with him Sent to me by Dr [name] for allergy evaluation. 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 Allergy vaccine was started 11/09/2010 and is building without problems He was still on prednisone when he saw Dr [name] recently and at that time he seemed improved after a recent exacerbation- 2 weeks ago. About 2 weeks after their visit he began coughing again. He hasn't noted any unusual exposure at work, or any sense that he had a cold.  His last IgE was 1747.  He doesn't feel "sick"- just tight and wheezy.  02/18/11-  37 yo former smoker followed for severe asthma, chronic sinusitis, hyper-eosinophilia and hyper IgE.  wife is with him Cooler weather is more comfortable for him. He continues to build allergy vaccine, now at 1:500, without problems. Some days continued to be better than others. 2 days ago thought his eyes were starting to itch and took Benadryl. We discussed comparison antihistamines. Not much sneezing. He has not needed his rescue inhaler in a long time but continues prednisone 20 mg daily. We educated on prednisone again.  04/20/11- 37  yo former smoker followed for severe asthma, chronic sinusitis, hyper-eosinophilia and hyper IgE.  wife is with him Went to ER 3 days ago- got neb and prednisone 60 mg x1. He finished our last taper yesterday. Has had flu shot. Bending over, long walks or climbing stairs, and long work shifts all make him short of breath with wheezing. He worked last night and now feels "65%". He does not think he has an acute cold or infection. He was last really well about 5 days ago. He denies heartburn. He continues building allergy vaccine now at 1:50, without problems. IgE level has been out of range for Xolair.  07/19/11- 37 yo former smoker followed for severe asthma, chronic sinusitis, hyper-eosinophilia and hyper IgE. Separated from wife have now living with his mother. Asthma control is "a whole lot better". He has not missed work. Did have to go to emergency room for kidney stone. Questions allergy vaccine causing "dark spots" on his neck. Using prednisone only occasionally. No routine wheeze, does short of breath at times.  11/18/11- 37 yo former smoker followed for severe asthma, chronic sinusitis, hyper-eosinophilia and hyper IgE. Went to ER for approximately 4 asthma attacks in the past month; He has continued allergy vaccine at 1:50. He is needed his nebulizer 2 or 3 times daily and occasional rescue inhaler. He denies infection or change in workplace exposure. He does not recognize reflux. He usually flares as soon as he finishes the prednisone taper so he and his wife asked about maintenance prednisone which we  discussed. Carefully. Sinus congestion tends to parallel his asthma episodes but he denies purulent nasal discharge or headache. We again discussed the significance of his elevated IgE and eosinophil levels. His IgE has been too high for Xolair but we will reassess. CXR 11/11/11-  IMPRESSION: No active disease. Hyperinflation again noted.  Original Report Authenticated By: Natasha Mead, M.D.    02/07/12- 56 yo former smoker followed for severe asthma, chronic sinusitis, hyper-eosinophilia and hyper IgE. FOLLOWS FOR: post hospital at [facility] 924 through 02/04/2012. DC summary reviewed. Discharge diagnosis asthma. Asthma began during the night. He took his own nebulizer, felt better and tried to go to work but had to leave. He had presented to [facility] ER where he was treated with 3 nebulizer treatments and sent out. He went from there to the [facility] emergency room. Discharged on a steroid taper. They comment in the summary that he was significantly depressed because he had watched his father died of asthma. He admits today that he had not told us of that history before. He feels better now but still wheezing and washed out as he tapers prednisone. He denies any sense of reflux or any recognized triggering experience. In the past he had thought there was something associated with his work place as discussed in earlier notes. He comments today that he can't breathe if he sleeps on his left side. There is no history of heart disease. He quit allergy vaccine in April, 2013.  03/21/12- 34 yo former smoker followed for severe asthma, chronic sinusitis, hyper-eosinophilia and hyper IgE. FOLLOWS FOR: states that breathing is doing good, still gets allergy shots 1:50 GH. He says he is doing well since her last call for prednisone on November 4. He had to Miss work for a day and needed a note. Missed one day last week as well. He says his job is safe and he actually has most asthma flares at home, not at work. Denies any infection now. Tends to wake up in the morning wheezing without any sense of acid reflux. He can usually tell at bedtime if he is going to have trouble during the night or in the morning. [name] is cost prohibitive. Uses nebulizer about twice daily. We are asking for records release so [facility] consent as results of cultures and chest x-ray  report.  05/22/12- 51 yo former smoker followed for severe asthma, chronic sinusitis, hyper-eosinophilia and hyper IgE.  Wife here FOLLOWS FOR: breathing was doing bad earlier today but he took a breathing tx and that helped. denies chest pain,  chest tightness or increased SOB. Last pred yeterday- we did steroid safety talk again. Started feeling tighter last night- blames"getting hot". Used his neb. CXR 02/01/12 IMPRESSION:  Peribronchial thickening and hyperlucency of the lungs compatible  with history of asthma.  No acute abnormalities.  Original Report Authenticated By: Lollie Marrow, M.D.   08/21/12- 7 yo former smoker followed for severe asthma, chronic sinusitis, hyper-eosinophilia and hyper IgE- too high for Xolair.   FOLLOWS FOR: went to hospital in [place] last night-asthmatic bronchitis. Alot of wheezing, cough; was given Zpak(has to get filled today) Has not taken allergy injection in a while due to illness ER visit in [place] last night for exacerbation of asthma again. Treated with antibiotic and still tightly wheezing. He has continued allergy vaccine 1:50 GH with no reactions. He is only clear when he is on prednisone.  09/19/12- 39 yo former smoker followed for severe asthma, chronic  sinusitis, hyper-eosinophilia and hyper IgE- too high for Xolair.   FOLLOWS FOR: pt states he has good and bad days with asthma flare ups-more good than bad though. No Er visits since last here. Not missing work. Not reacting to his allergy shots 1:50 GH.Discussed possibility of moving up to 1:10.   01/22/13- 57 yo former smoker followed for severe asthma, chronic sinusitis, hyper-eosinophilia and hyper IgE- too high for Xolair.  FOLLOWS FOR:  Waking up in nights and having asthma attacks x1 month. Also, having some snoring Tell him he has apnea. Wheezing is worse at night. Denies reflux and denies infection. Says he now feels "fine" at work.  03/05/13- 29 yo former smoker followed for severe asthma,  chronic sinusitis, hyper-eosinophilia and hyper IgE- too high for Xolair.  FOLLOWS FOR: denies any recent asthma attacks or use of prednisone; still on allergy vaccine 1:50 GH. He says he is doing very well since last here with no prednisone in 6 weeks. It may help with that the weather is cooler. He's working the same job, but no longer working with [employer]  Review of Systems- see HPI Constitutional:   No weight loss, night sweats,  Fevers, chills, fatigue, lassitude. HEENT:   No headaches,  Difficulty swallowing,  Tooth/dental problems,  Sore throat,                No sneezing, ear ache,  + some sniffing, throat itches CV:  No chest pain, orthopnea, PND, swelling in lower extremities, anasarca, dizziness, palpitations GI  No heartburn, indigestion, abdominal pain, nausea, vomiting,  Resp: +  wheeze.  No coughing up of blood.  No change in color of mucus.   Skin: no acute rash GU: no dysuria,  MS:  No joint pain or swelling.   Psych:  No change in mood or affect. No depression or anxiety.  No memory loss.  Objective:   Physical Exam General- Alert, Oriented, Affect-+calm, Distress- none acute;  Tall, trim Skin- clear Lymphadenopathy- none Head- atraumatic            Eyes- Gross vision intact, PERRLA, conjunctivae clear secretions            Ears- Hearing, canals normal            Nose- Clear, no-Septal dev, mucus, polyps, erosion, perforation             Throat- Mallampati II , mucosa clear , drainage- none, tonsils- atrophic Neck- flexible , trachea midline, no stridor , thyroid nl, carotid no bruit Chest - symmetrical excursion , unlabored           Heart/CV- RRR , no murmur , no gallop  , no rub, nl s1 s2                           - JVD- none , edema- none, stasis changes- none, varices- none           Lung-   +coarse without wheeze, unlabored, no cough , dullness-none, rub- none,            Chest wall-  Abd-  Br/ Gen/ Rectal- Not done, not indicated Extrem- cyanosis- none,  clubbing, none, atrophy- none, strength- nl Neuro- grossly intact to observation

## 2013-03-05 NOTE — Patient Instructions (Signed)
Script sent to refill Epipen  We can increase the strength of your allergy shots up to 1:10 next time you order.

## 2013-03-09 ENCOUNTER — Ambulatory Visit (INDEPENDENT_AMBULATORY_CARE_PROVIDER_SITE_OTHER): Payer: 59

## 2013-03-09 DIAGNOSIS — J309 Allergic rhinitis, unspecified: Secondary | ICD-10-CM

## 2013-03-15 ENCOUNTER — Other Ambulatory Visit: Payer: Self-pay | Admitting: Internal Medicine

## 2013-03-15 ENCOUNTER — Telehealth: Payer: Self-pay | Admitting: Internal Medicine

## 2013-03-15 MED ORDER — ALBUTEROL SULFATE (2.5 MG/3ML) 0.083% IN NEBU: 2.5000 mg | INHALATION_SOLUTION | Freq: Four times a day (QID) | RESPIRATORY_TRACT | Status: DC | PRN

## 2013-03-15 NOTE — Telephone Encounter (Signed)
Pt advised that rx for Albuterol neb was sent to pharmacy requested.

## 2013-03-18 NOTE — Assessment & Plan Note (Signed)
Better control. Plan-advance allergy vaccine to 1:10, GH, refill EpiPen

## 2013-03-23 ENCOUNTER — Ambulatory Visit (INDEPENDENT_AMBULATORY_CARE_PROVIDER_SITE_OTHER): Payer: 59

## 2013-03-23 DIAGNOSIS — J309 Allergic rhinitis, unspecified: Secondary | ICD-10-CM

## 2013-03-28 ENCOUNTER — Telehealth: Payer: Self-pay | Admitting: Internal Medicine

## 2013-03-28 ENCOUNTER — Other Ambulatory Visit: Payer: Self-pay | Admitting: Internal Medicine

## 2013-03-28 MED ORDER — MOMETASONE FURO-FORMOTEROL FUM 200-5 MCG/ACT IN AERO: 2.0000 | INHALATION_SPRAY | Freq: Two times a day (BID) | RESPIRATORY_TRACT | Status: DC

## 2013-03-28 NOTE — Telephone Encounter (Signed)
Rx has been sent in. Pt is aware. Nothing further was needed. 

## 2013-03-30 ENCOUNTER — Ambulatory Visit: Payer: 59

## 2013-04-06 ENCOUNTER — Other Ambulatory Visit: Payer: Self-pay | Admitting: Internal Medicine

## 2013-04-11 ENCOUNTER — Telehealth: Payer: Self-pay | Admitting: Internal Medicine

## 2013-04-11 MED ORDER — ALBUTEROL SULFATE HFA 108 (90 BASE) MCG/ACT IN AERS: 2.0000 | INHALATION_SPRAY | RESPIRATORY_TRACT | Status: DC | PRN

## 2013-04-11 NOTE — Telephone Encounter (Signed)
Spoke with patient -aware that I have sent RX

## 2013-05-04 ENCOUNTER — Ambulatory Visit (INDEPENDENT_AMBULATORY_CARE_PROVIDER_SITE_OTHER): Payer: 59

## 2013-05-04 ENCOUNTER — Telehealth: Payer: Self-pay | Admitting: Internal Medicine

## 2013-05-04 DIAGNOSIS — J309 Allergic rhinitis, unspecified: Secondary | ICD-10-CM

## 2013-05-04 MED ORDER — PREDNISONE 10 MG PO TABS: ORAL_TABLET | ORAL | Status: DC

## 2013-05-04 MED ORDER — DOXYCYCLINE HYCLATE 100 MG PO TABS: ORAL_TABLET | ORAL | Status: DC

## 2013-05-04 NOTE — Telephone Encounter (Signed)
Called and spoke with pt and he is aware of CY recs and that these meds have been sent to the pharmacy per pts request. Nothing further is needed.

## 2013-05-04 NOTE — Telephone Encounter (Signed)
Dry cough, chest tightness since Christmas; thick phelgm at times-clear in color. Has tired cough meds OTC and no relief. Feels like Prednisone taper and possible abx to hold in case phelgm changes color. Please advise.   Allergies  Allergen Reactions  . Azithromycin Rash  . Alvesco [Ciclesonide]     Possible reaction- rash  . Anoro Ellipta [Umeclidinium-Vilanterol]     Possible- rash  . Banana Swelling    Throat swelling   . Penicillins Rash

## 2013-05-04 NOTE — Telephone Encounter (Signed)
Per CY-okay to give Prednisone 10 mg #20 take 4 x 2 days, 3 x 2 days, 2 x 2 days, 1 x 2 days, then stop no refills; Doxycycline 100 mg #8 take 2 today then 1 daily no refills if he starts to feel worse.

## 2013-05-23 ENCOUNTER — Emergency Department (HOSPITAL_COMMUNITY): Payer: 59

## 2013-05-23 ENCOUNTER — Encounter (HOSPITAL_COMMUNITY): Payer: Self-pay | Admitting: Emergency Medicine

## 2013-05-23 DIAGNOSIS — Z792 Long term (current) use of antibiotics: Secondary | ICD-10-CM | POA: Insufficient documentation

## 2013-05-23 DIAGNOSIS — Z79899 Other long term (current) drug therapy: Secondary | ICD-10-CM | POA: Insufficient documentation

## 2013-05-23 DIAGNOSIS — IMO0002 Reserved for concepts with insufficient information to code with codable children: Secondary | ICD-10-CM | POA: Insufficient documentation

## 2013-05-23 DIAGNOSIS — Z87891 Personal history of nicotine dependence: Secondary | ICD-10-CM | POA: Insufficient documentation

## 2013-05-23 DIAGNOSIS — Z880 Allergy status to penicillin: Secondary | ICD-10-CM | POA: Insufficient documentation

## 2013-05-23 DIAGNOSIS — J45901 Unspecified asthma with (acute) exacerbation: Secondary | ICD-10-CM | POA: Insufficient documentation

## 2013-05-23 NOTE — ED Notes (Signed)
Patient states he has had a cough since yesterday; states he feels like his chest is on fire.  States brings up clear sputum when he coughs.

## 2013-05-24 ENCOUNTER — Emergency Department (HOSPITAL_COMMUNITY)
Admission: EM | Admit: 2013-05-24 | Discharge: 2013-05-24 | Disposition: A | Discharge status: Home / Self Care | Payer: 59 | Attending: Emergency Medicine | Admitting: Emergency Medicine

## 2013-05-24 DIAGNOSIS — J45901 Unspecified asthma with (acute) exacerbation: Secondary | ICD-10-CM

## 2013-05-24 MED ORDER — ALBUTEROL SULFATE (2.5 MG/3ML) 0.083% IN NEBU
5.0000 mg | INHALATION_SOLUTION | Freq: Once | RESPIRATORY_TRACT | Status: AC
  Administered 2013-05-24: 5 mg via RESPIRATORY_TRACT

## 2013-05-24 MED ORDER — PREDNISONE 50 MG PO TABS
60.0000 mg | ORAL_TABLET | Freq: Once | ORAL | Status: AC
  Administered 2013-05-24: 03:00:00 60 mg via ORAL
  Filled 2013-05-24 (×2): qty 1

## 2013-05-24 MED ORDER — PREDNISONE 20 MG PO TABS: 60.0000 mg | ORAL_TABLET | Freq: Every day | ORAL | Status: DC

## 2013-05-24 NOTE — ED Provider Notes (Signed)
CSN: [id number]     Arrival date & time 05/23/13  2315 History   First MD Initiated Contact with Patient 05/24/13 0159     Chief Complaint  Patient presents with  . Cough   (Consider location/radiation/quality/duration/timing/severity/associated sxs/prior Treatment) HPI History provided by patient. Has a history of asthma followed by Dr. [name] pulmonology. His asthma has been getting worse or lasts 24 hours with wheezing and difficulty breathing despite using his inhaler as prescribed. He denies any fevers, sore throat, runny nose or congestion. No known sick contacts.  No productive sputum. No hemoptysis. Symptoms moderate in severity.  Past Medical History  Diagnosis Date  . Asthma   . Seasonal allergies    History reviewed. No pertinent past surgical history. Family History  Problem Relation Age of Onset  . Emphysema Father    History  Substance Use Topics  . Smoking status: Former Smoker -- 0.50 packs/day for 11.5 years    Types: Cigarettes    Quit date: 04/09/2010  . Smokeless tobacco: Never Used     Comment: started at age 38.  1/2 ppd.    . Alcohol Use: No    Review of Systems  Constitutional: Negative for fever and chills.  Respiratory: Positive for shortness of breath and wheezing.   Cardiovascular: Negative for chest pain.  Gastrointestinal: Negative for abdominal pain.  Genitourinary: Negative for flank pain.  Musculoskeletal: Negative for back pain, neck pain and neck stiffness.  Skin: Negative for rash.  Neurological: Negative for headaches.  All other systems reviewed and are negative.    Allergies  Azithromycin; Alvesco; Anoro ellipta; Banana; and Penicillins  Home Medications   Current Outpatient Rx  Name  Route  Sig  Dispense  Refill  . albuterol (PROVENTIL HFA;VENTOLIN HFA) 108 (90 BASE) MCG/ACT inhaler   Inhalation   Inhale 2 puffs into the lungs every 4 (four) hours as needed for wheezing or shortness of breath.   1 Inhaler   prn   .  albuterol (PROVENTIL) (2.5 MG/3ML) 0.083% nebulizer solution      USE 1 VIAL IN VEBULIZER EVERY 6 HRS.AS NEEDED FOR SHORTNESS OF BREATH   75 mL   8   . DULERA 200-5 MCG/ACT AERO      INHALE 2 PUFFS TWICE A DAY   1 Inhaler   3     PATIENT NEEDS REFILLS   . EPINEPHrine (EPIPEN) 0.3 mg/0.3 mL SOAJ injection      Inject into thigh for severe allergic reaction   1 Device   prn   . ibuprofen (ADVIL,MOTRIN) 200 MG tablet   Oral   Take 200 mg by mouth every 6 (six) hours as needed. Pain         . mometasone-formoterol (DULERA) 200-5 MCG/ACT AERO   Inhalation   Inhale 2 puffs into the lungs 2 (two) times daily.   1 Inhaler   5   . EXPIRED: montelukast (SINGULAIR) 10 MG tablet   Oral   Take 1 tablet (10 mg total) by mouth at bedtime.   30 tablet   prn   . doxycycline (VIBRA-TABS) 100 MG tablet      Take 2 tablets by mouth today then 1 daily until gone   8 tablet   0   . predniSONE (DELTASONE) 10 MG tablet      TAKE 1 TABLET BY MOUTH DAILY   30 tablet   1   . predniSONE (DELTASONE) 10 MG tablet      Take 4 tabs x  2 days, 3 tabs x 2 days, 2 tabs x 2 days, 1 tab x 2 days then stop   20 tablet   0    BP 130/85  Pulse 66  Temp(Src) 97.8 F (36.6 C) (Oral)  Resp 20  Ht 6\' 3"  (1.905 m)  Wt 150 lb (68.04 kg)  BMI 18.75 kg/m2  SpO2 98% Physical Exam  Constitutional: He is oriented to person, place, and time. He appears well-developed and well-nourished.  HENT:  Head: Normocephalic and atraumatic.  Eyes: EOM are normal. Pupils are equal, round, and reactive to light.  Neck: Neck supple.  Cardiovascular: Normal rate, regular rhythm and intact distal pulses.   Pulmonary/Chest: No respiratory distress. He exhibits no tenderness.  Bilateral expiratory wheezes with some prolonged expirations  Abdominal: Soft. He exhibits no distension. There is no tenderness.  Musculoskeletal: Normal range of motion. He exhibits no edema and no tenderness.  Neurological: He is  alert and oriented to person, place, and time.  Skin: Skin is warm and dry.    ED Course  Procedures (including critical care time) Labs Review Labs Reviewed - No data to display Imaging Review Dg Chest 2 View  05/24/2013   CLINICAL DATA:  Cough for 2 day  EXAM: CHEST  2 VIEW  COMPARISON:  01/11/2013  FINDINGS: Hyperinflation. No infiltrate or edema. No effusion in a pneumothorax. Normal heart size.  IMPRESSION: No active cardiopulmonary disease.   Electronically Signed   By: Tiburcio Pea M.D.   On: 05/24/2013 00:25    EKG Interpretation   None      Room air pulse ox 90% borderline hypoxic  Albuterol and prednisone provided. Repeat pulse ox improving after treatment.  6:17 AM feeling better with lung sounds improved. Plan discharge home with prescription for prednisone and followup with Dr. Maple Hudson, pulmonologist. Patient has albuterol inhaler at home and will use it as prescribed. He has no flu symptoms. No fevers or productive sputum.  MDM  Diagnosis: Asthma exacerbation.  Improved with steroids and albuterol treatments. Chest x-ray obtained and reviewed as above. No infiltrates. Vital signs nurse's notes reviewed and considered.   Sunnie Nielsen, MD 05/24/13 [phone]

## 2013-05-24 NOTE — Discharge Instructions (Signed)
Asthma, Adult Asthma is a recurring condition in which the airways tighten and narrow. Asthma can make it difficult to breathe. It can cause coughing, wheezing, and shortness of breath. Asthma episodes (also called asthma attacks) range from minor to life-threatening. Asthma cannot be cured, but medicines and lifestyle changes can help control it. CAUSES Asthma is believed to be caused by inherited (genetic) and environmental factors, but its exact cause is unknown. Asthma may be triggered by allergens, lung infections, or irritants in the air. Asthma triggers are different for each person. Common triggers include:   Animal dander.  Dust mites.  Cockroaches.  Pollen from trees or grass.  Mold.  Smoke.  Air pollutants such as dust, household cleaners, hair sprays, aerosol sprays, paint fumes, strong chemicals, or strong odors.  Cold air, weather changes, and winds (which increase molds and pollens in the air).  Strong emotional expressions such as crying or laughing hard.  Stress.  Certain medicines (such as aspirin) or types of drugs (such as beta-blockers).  Sulfites in foods and drinks. Foods and drinks that may contain sulfites include dried fruit, potato chips, and sparkling grape juice.  Infections or inflammatory conditions such as the flu, a cold, or an inflammation of the nasal membranes (rhinitis).  Gastroesophageal reflux disease (GERD).  Exercise or strenuous activity. SYMPTOMS Symptoms may occur immediately after asthma is triggered or many hours later. Symptoms include:  Wheezing.  Excessive nighttime or early morning coughing.  Frequent or severe coughing with a common cold.  Chest tightness.  Shortness of breath. DIAGNOSIS  The diagnosis of asthma is made by a review of your medical history and a physical exam. Tests may also be performed. These may include:  Lung function studies. These tests show how much air you breath in and out.  Allergy  tests.  Imaging tests such as X-rays. TREATMENT  Asthma cannot be cured, but it can usually be controlled. Treatment involves identifying and avoiding your asthma triggers. It also involves medicines. There are 2 classes of medicine used for asthma treatment:   Controller medicines. These prevent asthma symptoms from occurring. They are usually taken every day.  Reliever or rescue medicines. These quickly relieve asthma symptoms. They are used as needed and provide short-term relief. Your health care provider will help you create an asthma action plan. An asthma action plan is a written plan for managing and treating your asthma attacks. It includes a list of your asthma triggers and how they may be avoided. It also includes information on when medicines should be taken and when their dosage should be changed. An action plan may also involve the use of a device called a peak flow meter. A peak flow meter measures how well the lungs are working. It helps you monitor your condition. HOME CARE INSTRUCTIONS   Take medicine as directed by your health care provider. Speak with your health care provider if you have questions about how or when to take the medicines.  Use a peak flow meter as directed by your health care provider. Record and keep track of readings.  Understand and use the action plan to help minimize or stop an asthma attack without needing to seek medical care.  Control your home environment in the following ways to help prevent asthma attacks:  Do not smoke. Avoid being exposed to secondhand smoke.  Change your heating and air conditioning filter regularly.  Limit your use of fireplaces and wood stoves.  Get rid of pests (such as roaches and   mice) and their droppings.  Throw away plants if you see mold on them.  Clean your floors and dust regularly. Use unscented cleaning products.  Try to have someone else vacuum for you regularly. Stay out of rooms while they are being  vacuumed and for a short while afterward. If you vacuum, use a dust mask from a hardware store, a double-layered or microfilter vacuum cleaner bag, or a vacuum cleaner with a HEPA filter.  Replace carpet with wood, tile, or vinyl flooring. Carpet can trap dander and dust.  Use allergy-proof pillows, mattress covers, and box spring covers.  Wash bed sheets and blankets every week in hot water and dry them in a dryer.  Use blankets that are made of polyester or cotton.  Clean bathrooms and kitchens with bleach. If possible, have someone repaint the walls in these rooms with mold-resistant paint. Keep out of the rooms that are being cleaned and painted.  Wash hands frequently. SEEK MEDICAL CARE IF:   You have wheezing, shortness of breath, or a cough even if taking medicine to prevent attacks.  The colored mucus you cough up (sputum) is thicker than usual.  Your sputum changes from clear or white to yellow, green, gray, or bloody.  You have any problems that may be related to the medicines you are taking (such as a rash, itching, swelling, or trouble breathing).  You are using a reliever medicine more than 2 3 times per week.  Your peak flow is still at 50 79% of you personal best after following your action plan for 1 hour. SEEK IMMEDIATE MEDICAL CARE IF:   You seem to be getting worse and are unresponsive to treatment during an asthma attack.  You are short of breath even at rest.  You get short of breath when doing very little physical activity.  You have difficulty eating, drinking, or talking due to asthma symptoms.  You develop chest pain.  You develop a fast heartbeat.  You have a bluish color to your lips or fingernails.  You are lightheaded, dizzy, or faint.  Your peak flow is less than 50% of your personal best.  You have a fever or persistent symptoms for more than 2 3 days.  You have a fever and symptoms suddenly get worse. MAKE SURE YOU:   Understand these  instructions.  Will watch your condition.  Will get help right away if you are not doing well or get worse. Document Released: 04/26/2005 Document Revised: 12/27/2012 Document Reviewed: 11/23/2012 [employer] Patient Information 2014 [employer].  

## 2013-05-25 ENCOUNTER — Telehealth: Payer: Self-pay | Admitting: Internal Medicine

## 2013-05-25 ENCOUNTER — Encounter: Payer: Self-pay | Admitting: *Deleted

## 2013-05-25 NOTE — Telephone Encounter (Signed)
Last OV 03-05-13. Pt states he went to the ER on late Wed night for sob. He states they gave him neb treatments and 60 mg prednisone. Pt states they gave him a note for wed and Thursday. He states he is still having increased SOB, chest tightness, productive cough with clear phelgm. He is requesting to have a work note for tonight and Saturday night. He wants to return on Monday without restrictions. Pt in lobby. Please advise.Carron CurieJennifer Trayton Szabo, CMA Allergies  Allergen Reactions  . Azithromycin Rash  . Alvesco [Ciclesonide]     Possible reaction- rash  . Anoro Ellipta [Umeclidinium-Vilanterol]     Possible- rash  . Banana Swelling    Throat swelling   . Penicillins Rash

## 2013-05-25 NOTE — Telephone Encounter (Signed)
Per CY ok to do letter. Letter given to patient. Drew CurieJennifer Lacee Sandoval, CMA

## 2013-06-01 ENCOUNTER — Telehealth: Payer: Self-pay | Admitting: Internal Medicine

## 2013-06-01 ENCOUNTER — Ambulatory Visit (INDEPENDENT_AMBULATORY_CARE_PROVIDER_SITE_OTHER): Payer: 59

## 2013-06-01 DIAGNOSIS — J309 Allergic rhinitis, unspecified: Secondary | ICD-10-CM

## 2013-06-01 MED ORDER — PREDNISONE 10 MG PO TABS: ORAL_TABLET | ORAL | Status: DC

## 2013-06-01 NOTE — Telephone Encounter (Signed)
Per CDY: call in prednisone 8 day pack Called pt lmtcb x1 on # listed Called alternate contact # for pt and they are not working #'s Called mother phone # listed. Spoke with pt and made aware of recs. RX has been called in. Nothing further needed

## 2013-06-01 NOTE — Telephone Encounter (Signed)
Spoke with the pt  He is c/o increased SOB, wheezing with exertion and increased cough x 4 days  Cough is mainly non prod but will occ produce minimal clear sputum  No f/c/s, CP or other co's  Please advise, thanks! Last ov 03/05/14  Next appt 07/06/13   Allergies  Allergen Reactions  . Azithromycin Rash  . Alvesco [Ciclesonide]     Possible reaction- rash  . Anoro Ellipta [Umeclidinium-Vilanterol]     Possible- rash  . Banana Swelling    Throat swelling   . Penicillins Rash   Current Outpatient Prescriptions on File Prior to Visit  Medication Sig Dispense Refill  . albuterol (PROVENTIL HFA;VENTOLIN HFA) 108 (90 BASE) MCG/ACT inhaler Inhale 2 puffs into the lungs every 4 (four) hours as needed for wheezing or shortness of breath.  1 Inhaler  prn  . albuterol (PROVENTIL) (2.5 MG/3ML) 0.083% nebulizer solution USE 1 VIAL IN VEBULIZER EVERY 6 HRS.AS NEEDED FOR SHORTNESS OF BREATH  75 mL  8  . doxycycline (VIBRA-TABS) 100 MG tablet Take 2 tablets by mouth today then 1 daily until gone  8 tablet  0  . DULERA 200-5 MCG/ACT AERO INHALE 2 PUFFS TWICE A DAY  1 Inhaler  3  . EPINEPHrine (EPIPEN) 0.3 mg/0.3 mL SOAJ injection Inject into thigh for severe allergic reaction  1 Device  prn  . ibuprofen (ADVIL,MOTRIN) 200 MG tablet Take 200 mg by mouth every 6 (six) hours as needed. Pain      . mometasone-formoterol (DULERA) 200-5 MCG/ACT AERO Inhale 2 puffs into the lungs 2 (two) times daily.  1 Inhaler  5  . montelukast (SINGULAIR) 10 MG tablet Take 1 tablet (10 mg total) by mouth at bedtime.  30 tablet  prn  . predniSONE (DELTASONE) 10 MG tablet TAKE 1 TABLET BY MOUTH DAILY  30 tablet  1  . predniSONE (DELTASONE) 10 MG tablet Take 4 tabs x 2 days, 3 tabs x 2 days, 2 tabs x 2 days, 1 tab x 2 days then stop  20 tablet  0  . predniSONE (DELTASONE) 20 MG tablet Take 3 tablets (60 mg total) by mouth daily.  15 tablet  0   No current facility-administered medications on file prior to visit.

## 2013-06-08 ENCOUNTER — Ambulatory Visit (INDEPENDENT_AMBULATORY_CARE_PROVIDER_SITE_OTHER): Payer: 59

## 2013-06-08 ENCOUNTER — Other Ambulatory Visit: Payer: Self-pay | Admitting: Internal Medicine

## 2013-06-08 DIAGNOSIS — J309 Allergic rhinitis, unspecified: Secondary | ICD-10-CM

## 2013-06-15 ENCOUNTER — Ambulatory Visit: Payer: 59

## 2013-06-18 ENCOUNTER — Emergency Department (HOSPITAL_COMMUNITY)
Admission: EM | Admit: 2013-06-18 | Discharge: 2013-06-18 | Disposition: A | Discharge status: Home / Self Care | Payer: 59 | Attending: Emergency Medicine | Admitting: Emergency Medicine

## 2013-06-18 ENCOUNTER — Encounter (HOSPITAL_COMMUNITY): Payer: Self-pay | Admitting: Emergency Medicine

## 2013-06-18 DIAGNOSIS — Z880 Allergy status to penicillin: Secondary | ICD-10-CM | POA: Insufficient documentation

## 2013-06-18 DIAGNOSIS — Z87891 Personal history of nicotine dependence: Secondary | ICD-10-CM | POA: Insufficient documentation

## 2013-06-18 DIAGNOSIS — Z79899 Other long term (current) drug therapy: Secondary | ICD-10-CM | POA: Insufficient documentation

## 2013-06-18 DIAGNOSIS — K0889 Other specified disorders of teeth and supporting structures: Secondary | ICD-10-CM

## 2013-06-18 DIAGNOSIS — J45909 Unspecified asthma, uncomplicated: Secondary | ICD-10-CM | POA: Insufficient documentation

## 2013-06-18 DIAGNOSIS — K089 Disorder of teeth and supporting structures, unspecified: Secondary | ICD-10-CM | POA: Insufficient documentation

## 2013-06-18 DIAGNOSIS — IMO0002 Reserved for concepts with insufficient information to code with codable children: Secondary | ICD-10-CM | POA: Insufficient documentation

## 2013-06-18 DIAGNOSIS — Z792 Long term (current) use of antibiotics: Secondary | ICD-10-CM | POA: Insufficient documentation

## 2013-06-18 MED ORDER — TRAMADOL HCL 50 MG PO TABS: 50.0000 mg | ORAL_TABLET | Freq: Four times a day (QID) | ORAL | Status: DC | PRN

## 2013-06-18 MED ORDER — CLINDAMYCIN HCL 150 MG PO CAPS: 300.0000 mg | ORAL_CAPSULE | Freq: Three times a day (TID) | ORAL | Status: DC

## 2013-06-18 MED ORDER — KETOROLAC TROMETHAMINE 60 MG/2ML IM SOLN
60.0000 mg | Freq: Once | INTRAMUSCULAR | Status: AC
  Administered 2013-06-18: 60 mg via INTRAMUSCULAR
  Filled 2013-06-18: qty 2

## 2013-06-18 MED ORDER — NAPROXEN 500 MG PO TABS: 500.0000 mg | ORAL_TABLET | Freq: Two times a day (BID) | ORAL | Status: DC

## 2013-06-18 NOTE — ED Provider Notes (Signed)
CSN: [id number]     Arrival date & time 06/18/13  0107 History   First MD Initiated Contact with Patient 06/18/13 0158     Chief Complaint  Patient presents with  . Dental Pain   (Consider location/radiation/quality/duration/timing/severity/associated sxs/prior Treatment) HPI Comments: 38 year old male, history of poor dentition, states that he had a filling that came out several days ago, strangely the patient describes calling for a dental appointment on Friday however he states that in the same sentence the dental implant did not come out until Saturday. He denies fevers chills nausea vomiting but does have tenderness chewing and states that this came out while he was chewing on something hard. He has tried over-the-counter medications including anti-inflammatories with minimal improvement  Patient is a 38 y.o. male presenting with tooth pain. The history is provided by the patient.  Dental Pain Associated symptoms: no facial swelling and no fever     Past Medical History  Diagnosis Date  . Asthma   . Seasonal allergies    History reviewed. No pertinent past surgical history. Family History  Problem Relation Age of Onset  . Emphysema Father    History  Substance Use Topics  . Smoking status: Former Smoker -- 0.50 packs/day for 11.5 years    Types: Cigarettes    Quit date: 04/09/2010  . Smokeless tobacco: Never Used     Comment: started at age 38.  1/2 ppd.    . Alcohol Use: No    Review of Systems  Constitutional: Negative for fever and chills.  HENT: Positive for dental problem. Negative for facial swelling, sore throat, trouble swallowing and voice change.        Toothache  Gastrointestinal: Negative for nausea and vomiting.    Allergies  Azithromycin; Alvesco; Anoro ellipta; Banana; and Penicillins  Home Medications   Current Outpatient Rx  Name  Route  Sig  Dispense  Refill  . albuterol (PROVENTIL HFA;VENTOLIN HFA) 108 (90 BASE) MCG/ACT inhaler   Inhalation    Inhale 2 puffs into the lungs every 4 (four) hours as needed for wheezing or shortness of breath.   1 Inhaler   prn   . albuterol (PROVENTIL) (2.5 MG/3ML) 0.083% nebulizer solution      USE 1 VIAL IN VEBULIZER EVERY 6 HRS.AS NEEDED FOR SHORTNESS OF BREATH   75 mL   8   . clindamycin (CLEOCIN) 150 MG capsule   Oral   Take 2 capsules (300 mg total) by mouth 3 (three) times daily. May dispense as 150mg  capsules   60 capsule   0   . doxycycline (VIBRA-TABS) 100 MG tablet      Take 2 tablets by mouth today then 1 daily until gone   8 tablet   0   . DULERA 200-5 MCG/ACT AERO      INHALE 2 PUFFS TWICE A DAY   1 Inhaler   3     PATIENT NEEDS REFILLS   . EPINEPHrine (EPIPEN) 0.3 mg/0.3 mL SOAJ injection      Inject into thigh for severe allergic reaction   1 Device   prn   . ibuprofen (ADVIL,MOTRIN) 200 MG tablet   Oral   Take 200 mg by mouth every 6 (six) hours as needed. Pain         . mometasone-formoterol (DULERA) 200-5 MCG/ACT AERO   Inhalation   Inhale 2 puffs into the lungs 2 (two) times daily.   1 Inhaler   5   . montelukast (SINGULAIR) 10 MG  tablet      TAKE 1 TABLET BY MOUTH AT BEDTIME   30 tablet   2   . naproxen (NAPROSYN) 500 MG tablet   Oral   Take 1 tablet (500 mg total) by mouth 2 (two) times daily with a meal.   30 tablet   0   . predniSONE (DELTASONE) 10 MG tablet      Take 4 tabs daily x 2 days, 3 tabs daily x 2 days, 2 tabs daily x 2 days, 1 tab daily x 2 days then stop   20 tablet   0   . traMADol (ULTRAM) 50 MG tablet   Oral   Take 1 tablet (50 mg total) by mouth every 6 (six) hours as needed.   15 tablet   0    BP 173/96  Pulse 71  Temp(Src) 98.4 F (36.9 C) (Oral)  Resp 16  Ht 6\' 3"  (1.905 m)  Wt 150 lb (68.04 kg)  BMI 18.75 kg/m2  SpO2 97% Physical Exam  Nursing note and vitals reviewed. Constitutional: He appears well-developed and well-nourished. No distress.  HENT:  Head: Normocephalic and atraumatic.   Mouth/Throat: Oropharynx is clear and moist. No oropharyngeal exudate.  Dental Disease - rear molar on the bottom right jaw with partial filling which is missing, no surrounding abscess, no surrounding erythema, mild tenderness over the tooth. No pain with tongue extrusion, no tenderness underneath the tongue to palpation, no trismus or torticollis  Eyes: Conjunctivae are normal. No scleral icterus.  Neck: Normal range of motion. Neck supple. No thyromegaly present.  Cardiovascular: Normal rate and regular rhythm.   Pulmonary/Chest: Effort normal and breath sounds normal.  Lymphadenopathy:    He has no cervical adenopathy.  Neurological: He is alert.  Skin: Skin is warm and dry. No rash noted. He is not diaphoretic.    ED Course  Procedures (including critical care time) Labs Review Labs Reviewed - No data to display Imaging Review No results found.  EKG Interpretation   None       MDM   1. Pain, dental    The patient appears to have some dental pain, there is no associated abscess which is drainable, no asymmetry to the jaw and no trismus or torticollis or signs of Ludwig angina, medications as below, encouraged", patient states that he can be seen on Wednesday.  Penicillin allergy   Meds given in ED:  Medications  ketorolac (TORADOL) injection 60 mg (not administered)    New Prescriptions   CLINDAMYCIN (CLEOCIN) 150 MG CAPSULE    Take 2 capsules (300 mg total) by mouth 3 (three) times daily. May dispense as 150mg  capsules   NAPROXEN (NAPROSYN) 500 MG TABLET    Take 1 tablet (500 mg total) by mouth 2 (two) times daily with a meal.   TRAMADOL (ULTRAM) 50 MG TABLET    Take 1 tablet (50 mg total) by mouth every 6 (six) hours as needed.        Vida Roller, MD 06/18/13 [phone]

## 2013-06-18 NOTE — Discharge Instructions (Signed)
Please call your doctor for a followup appointment within 24-48 hours. When you talk to your doctor please let them know that you were seen in the emergency department and have them acquire all of your records so that they can discuss the findings with you and formulate a treatment plan to fully care for your new and ongoing problems. ° °

## 2013-06-18 NOTE — ED Notes (Signed)
Patient reports noticed dental filling came out of tooth Saturday night. Complaining of pain since.

## 2013-06-21 ENCOUNTER — Telehealth: Payer: Self-pay | Admitting: Internal Medicine

## 2013-06-21 MED ORDER — PREDNISONE 10 MG PO TABS: ORAL_TABLET | ORAL | Status: DC

## 2013-06-21 NOTE — Telephone Encounter (Signed)
Offer prednisone 10 mg, # 20, 4 X 2 DAYS, 3 X 2 DAYS, 2 X 2 DAYS, 1 X 2 DAYS  

## 2013-06-21 NOTE — Telephone Encounter (Signed)
Spoke with pt. Reports SOB, wheezing, dry cough and chest tightness since last night. Used albuterol neb and inhaler with minimal relief. Still using Dulera 2 puffs BID. Wants CY's recs.  Allergies  Allergen Reactions  . Azithromycin Rash  . Alvesco [Ciclesonide]     Possible reaction- rash  . Anoro Ellipta [Umeclidinium-Vilanterol]     Possible- rash  . Banana Swelling    Throat swelling   . Penicillins Rash   Current Outpatient Prescriptions on File Prior to Visit  Medication Sig Dispense Refill  . albuterol (PROVENTIL HFA;VENTOLIN HFA) 108 (90 BASE) MCG/ACT inhaler Inhale 2 puffs into the lungs every 4 (four) hours as needed for wheezing or shortness of breath.  1 Inhaler  prn  . albuterol (PROVENTIL) (2.5 MG/3ML) 0.083% nebulizer solution USE 1 VIAL IN VEBULIZER EVERY 6 HRS.AS NEEDED FOR SHORTNESS OF BREATH  75 mL  8  . clindamycin (CLEOCIN) 150 MG capsule Take 2 capsules (300 mg total) by mouth 3 (three) times daily. May dispense as 150mg  capsules  60 capsule  0  . doxycycline (VIBRA-TABS) 100 MG tablet Take 2 tablets by mouth today then 1 daily until gone  8 tablet  0  . DULERA 200-5 MCG/ACT AERO INHALE 2 PUFFS TWICE A DAY  1 Inhaler  3  . EPINEPHrine (EPIPEN) 0.3 mg/0.3 mL SOAJ injection Inject into thigh for severe allergic reaction  1 Device  prn  . ibuprofen (ADVIL,MOTRIN) 200 MG tablet Take 200 mg by mouth every 6 (six) hours as needed. Pain      . mometasone-formoterol (DULERA) 200-5 MCG/ACT AERO Inhale 2 puffs into the lungs 2 (two) times daily.  1 Inhaler  5  . montelukast (SINGULAIR) 10 MG tablet TAKE 1 TABLET BY MOUTH AT BEDTIME  30 tablet  2  . naproxen (NAPROSYN) 500 MG tablet Take 1 tablet (500 mg total) by mouth 2 (two) times daily with a meal.  30 tablet  0  . predniSONE (DELTASONE) 10 MG tablet Take 4 tabs daily x 2 days, 3 tabs daily x 2 days, 2 tabs daily x 2 days, 1 tab daily x 2 days then stop  20 tablet  0  . traMADol (ULTRAM) 50 MG tablet Take 1 tablet (50  mg total) by mouth every 6 (six) hours as needed.  15 tablet  0   No current facility-administered medications on file prior to visit.    CY - please advise. Thanks.

## 2013-06-21 NOTE — Telephone Encounter (Signed)
Spoke with pt and advised of Dr Roxy CedarYoung's recommendations.  Prednisone rx sent to pharmacy.

## 2013-06-27 ENCOUNTER — Inpatient Hospital Stay (HOSPITAL_COMMUNITY)
Admission: EM | Admit: 2013-06-27 | Discharge: 2013-06-28 | DRG: 203 | Disposition: A | Discharge status: Home / Self Care | Payer: 59 | Attending: Family Medicine | Admitting: Family Medicine

## 2013-06-27 ENCOUNTER — Encounter (HOSPITAL_COMMUNITY): Payer: Self-pay | Admitting: Emergency Medicine

## 2013-06-27 ENCOUNTER — Emergency Department (HOSPITAL_COMMUNITY): Payer: 59

## 2013-06-27 DIAGNOSIS — D71 Functional disorders of polymorphonuclear neutrophils: Secondary | ICD-10-CM | POA: Diagnosis present

## 2013-06-27 DIAGNOSIS — D824 Hyperimmunoglobulin E [IgE] syndrome: Secondary | ICD-10-CM | POA: Diagnosis present

## 2013-06-27 DIAGNOSIS — D721 Eosinophilia, unspecified: Secondary | ICD-10-CM | POA: Diagnosis present

## 2013-06-27 DIAGNOSIS — Z87891 Personal history of nicotine dependence: Secondary | ICD-10-CM

## 2013-06-27 DIAGNOSIS — R768 Other specified abnormal immunological findings in serum: Secondary | ICD-10-CM | POA: Diagnosis present

## 2013-06-27 DIAGNOSIS — J4550 Severe persistent asthma, uncomplicated: Secondary | ICD-10-CM | POA: Diagnosis present

## 2013-06-27 DIAGNOSIS — J45909 Unspecified asthma, uncomplicated: Secondary | ICD-10-CM

## 2013-06-27 DIAGNOSIS — J4540 Moderate persistent asthma, uncomplicated: Secondary | ICD-10-CM | POA: Diagnosis present

## 2013-06-27 DIAGNOSIS — J45901 Unspecified asthma with (acute) exacerbation: Secondary | ICD-10-CM | POA: Diagnosis present

## 2013-06-27 LAB — CBC WITH DIFFERENTIAL/PLATELET
Basophils Absolute: 0.1 10*3/uL (ref 0.0–0.1)
Basophils Relative: 1 % (ref 0–1)
EOS PCT: 17 % — AB (ref 0–5)
Eosinophils Absolute: 1.5 10*3/uL — ABNORMAL HIGH (ref 0.0–0.7)
HCT: 42.9 % (ref 39.0–52.0)
Hemoglobin: 14.6 g/dL (ref 13.0–17.0)
LYMPHS ABS: 4 10*3/uL (ref 0.7–4.0)
LYMPHS PCT: 46 % (ref 12–46)
MCH: 30.5 pg (ref 26.0–34.0)
MCHC: 34 g/dL (ref 30.0–36.0)
MCV: 89.6 fL (ref 78.0–100.0)
Monocytes Absolute: 0.6 10*3/uL (ref 0.1–1.0)
Monocytes Relative: 6 % (ref 3–12)
NEUTROS ABS: 2.6 10*3/uL (ref 1.7–7.7)
Neutrophils Relative %: 30 % — ABNORMAL LOW (ref 43–77)
PLATELETS: 276 10*3/uL (ref 150–400)
RBC: 4.79 MIL/uL (ref 4.22–5.81)
RDW: 13.5 % (ref 11.5–15.5)
WBC: 8.8 10*3/uL (ref 4.0–10.5)

## 2013-06-27 LAB — COMPREHENSIVE METABOLIC PANEL
ALK PHOS: 94 U/L (ref 39–117)
ALT: 6 U/L (ref 0–53)
AST: 16 U/L (ref 0–37)
Albumin: 3.6 g/dL (ref 3.5–5.2)
BILIRUBIN TOTAL: 0.2 mg/dL — AB (ref 0.3–1.2)
BUN: 11 mg/dL (ref 6–23)
CALCIUM: 8.8 mg/dL (ref 8.4–10.5)
CHLORIDE: 106 meq/L (ref 96–112)
CO2: 28 meq/L (ref 19–32)
Creatinine, Ser: 0.78 mg/dL (ref 0.50–1.35)
GLUCOSE: 94 mg/dL (ref 70–99)
POTASSIUM: 3.7 meq/L (ref 3.7–5.3)
SODIUM: 146 meq/L (ref 137–147)
Total Protein: 7.5 g/dL (ref 6.0–8.3)

## 2013-06-27 MED ORDER — ALBUTEROL SULFATE (2.5 MG/3ML) 0.083% IN NEBU
2.5000 mg | INHALATION_SOLUTION | Freq: Once | RESPIRATORY_TRACT | Status: AC
  Administered 2013-06-27: 2.5 mg via RESPIRATORY_TRACT
  Filled 2013-06-27: qty 3

## 2013-06-27 MED ORDER — SODIUM CHLORIDE 0.9 % IJ SOLN
3.0000 mL | Freq: Two times a day (BID) | INTRAMUSCULAR | Status: DC
  Administered 2013-06-27 – 2013-06-28 (×2): 3 mL via INTRAVENOUS

## 2013-06-27 MED ORDER — ALBUTEROL SULFATE HFA 108 (90 BASE) MCG/ACT IN AERS: 2.0000 | INHALATION_SPRAY | Freq: Four times a day (QID) | RESPIRATORY_TRACT | Status: DC

## 2013-06-27 MED ORDER — ALBUTEROL SULFATE (2.5 MG/3ML) 0.083% IN NEBU
INHALATION_SOLUTION | RESPIRATORY_TRACT | Status: AC
  Filled 2013-06-27: qty 12

## 2013-06-27 MED ORDER — ACETAMINOPHEN 325 MG PO TABS: 650.0000 mg | ORAL_TABLET | Freq: Four times a day (QID) | ORAL | Status: DC | PRN

## 2013-06-27 MED ORDER — IPRATROPIUM-ALBUTEROL 0.5-2.5 (3) MG/3ML IN SOLN
3.0000 mL | Freq: Once | RESPIRATORY_TRACT | Status: AC
  Administered 2013-06-27: 3 mL via RESPIRATORY_TRACT
  Filled 2013-06-27: qty 3

## 2013-06-27 MED ORDER — ALBUTEROL SULFATE (2.5 MG/3ML) 0.083% IN NEBU
2.5000 mg | INHALATION_SOLUTION | RESPIRATORY_TRACT | Status: DC | PRN
  Filled 2013-06-27: qty 3

## 2013-06-27 MED ORDER — SODIUM CHLORIDE 0.9 % IV SOLN: 250.0000 mL | INTRAVENOUS | Status: DC | PRN

## 2013-06-27 MED ORDER — METHYLPREDNISOLONE SODIUM SUCC 125 MG IJ SOLR
125.0000 mg | Freq: Once | INTRAMUSCULAR | Status: AC
  Administered 2013-06-27: 125 mg via INTRAVENOUS
  Filled 2013-06-27: qty 2

## 2013-06-27 MED ORDER — ONDANSETRON HCL 4 MG PO TABS: 4.0000 mg | ORAL_TABLET | Freq: Four times a day (QID) | ORAL | Status: DC | PRN

## 2013-06-27 MED ORDER — METHYLPREDNISOLONE SODIUM SUCC 40 MG IJ SOLR
40.0000 mg | Freq: Four times a day (QID) | INTRAMUSCULAR | Status: DC
  Administered 2013-06-27 – 2013-06-28 (×3): 40 mg via INTRAVENOUS
  Filled 2013-06-27 (×3): qty 1

## 2013-06-27 MED ORDER — ACETAMINOPHEN 650 MG RE SUPP
650.0000 mg | Freq: Four times a day (QID) | RECTAL | Status: DC | PRN
Start: 2013-06-27 — End: 2013-06-28

## 2013-06-27 MED ORDER — ALBUTEROL (5 MG/ML) CONTINUOUS INHALATION SOLN
10.0000 mg/h | INHALATION_SOLUTION | Freq: Once | RESPIRATORY_TRACT | Status: AC
  Administered 2013-06-27: 10 mg/h via RESPIRATORY_TRACT

## 2013-06-27 MED ORDER — SODIUM CHLORIDE 0.9 % IJ SOLN: 3.0000 mL | INTRAMUSCULAR | Status: DC | PRN

## 2013-06-27 MED ORDER — ONDANSETRON HCL 4 MG/2ML IJ SOLN: 4.0000 mg | Freq: Four times a day (QID) | INTRAMUSCULAR | Status: DC | PRN

## 2013-06-27 MED ORDER — MONTELUKAST SODIUM 10 MG PO TABS
10.0000 mg | ORAL_TABLET | Freq: Every day | ORAL | Status: DC
  Administered 2013-06-27: 10 mg via ORAL
  Filled 2013-06-27: qty 1

## 2013-06-27 MED ORDER — ALBUTEROL SULFATE (2.5 MG/3ML) 0.083% IN NEBU
2.5000 mg | INHALATION_SOLUTION | Freq: Four times a day (QID) | RESPIRATORY_TRACT | Status: DC
  Administered 2013-06-27 – 2013-06-28 (×2): 2.5 mg via RESPIRATORY_TRACT
  Filled 2013-06-27 (×2): qty 3

## 2013-06-27 MED ORDER — IPRATROPIUM BROMIDE 0.02 % IN SOLN
0.5000 mg | Freq: Once | RESPIRATORY_TRACT | Status: AC
  Administered 2013-06-27: 0.5 mg via RESPIRATORY_TRACT
  Filled 2013-06-27: qty 2.5

## 2013-06-27 NOTE — ED Notes (Signed)
Pt asthma attack that started a few min ago.   Pt having SOB, chest pain, and cough.

## 2013-06-27 NOTE — H&P (Signed)
History and Physical  Drew Sandoval [id number] DOB: 04-09-1976 DOA: 06/27/2013  Referring physician: Dr. Estell HarpinZammit PCP: [name],[name], MD  Pulmonologist: Fannie Kneelint Young, MD  Chief Complaint: SOB  HPI:  37yom with severe persistent asthma followed by Dr. [name] presents to the emergency department with increasing shortness of breath. Initial evaluation suggested asthma exacerbation, patient was treated with nebulizer therapy and Solu-Medrol and referred for admission. Chest x-ray no acute abnormalities.  He felt well yesterday. This morning he awoke very short of breath. No specific aggravating or alleviating factors, no inciting factors. He feels better now after treatment in the emergency department. He does note that his asthma exacerbations only start in the morning.  In the emergency department afebrile, respiratory rate is 26, no hypoxia. Complete metabolic panel and CBC unremarkable.  Review of Systems:  Negative for fever, visual changes, sore throat, rash, new muscle aches, chest pain, dysuria, bleeding, n/v/abdominal pain.  Past Medical History  Diagnosis Date  . Asthma   . Seasonal allergies     Past Surgical History  Procedure Laterality Date  . None      Social History:  reports that he quit smoking about 3 years ago. His smoking use included Cigarettes. He has a 5.75 pack-year smoking history. He has never used smokeless tobacco. He reports that he does not drink alcohol or use illicit drugs.  Allergies  Allergen Reactions  . Azithromycin Rash  . Alvesco [Ciclesonide]     Possible reaction- rash  . Anoro Ellipta [Umeclidinium-Vilanterol]     Possible- rash  . Banana Swelling    Throat swelling   . Penicillins Rash    Family History  Problem Relation Age of Onset  . Emphysema Father      Prior to Admission medications   Medication Sig Start Date End Date Taking? Authorizing Provider  albuterol (PROVENTIL HFA;VENTOLIN HFA) 108 (90 BASE) MCG/ACT inhaler  Inhale 2 puffs into the lungs every 4 (four) hours as needed for wheezing or shortness of breath. 04/11/13 04/11/14 Yes Clinton D Young, MD  albuterol (PROVENTIL) (2.5 MG/3ML) 0.083% nebulizer solution USE 1 VIAL IN VEBULIZER EVERY 6 HRS.AS NEEDED FOR SHORTNESS OF BREATH 03/15/13  Yes Waymon Budgelinton D Young, MD  EPINEPHrine (EPIPEN) 0.3 mg/0.3 mL SOAJ injection Inject into thigh for severe allergic reaction 03/05/13  Yes Waymon Budgelinton D Young, MD  ibuprofen (ADVIL,MOTRIN) 200 MG tablet Take 200 mg by mouth every 6 (six) hours as needed. Pain   Yes Historical Provider, MD  mometasone-formoterol (DULERA) 200-5 MCG/ACT AERO Inhale 2 puffs into the lungs 2 (two) times daily. 03/28/13  Yes Waymon Budgelinton D Young, MD  montelukast (SINGULAIR) 10 MG tablet TAKE 1 TABLET BY MOUTH AT BEDTIME 06/08/13  Yes Waymon Budgelinton D Young, MD   Physical Exam: Filed Vitals:   06/27/13 1151 06/27/13 1213 06/27/13 1331 06/27/13 1459  BP:   129/79 133/67  Pulse:   90 83  Temp:      TempSrc:      Resp:   18 18  Height:      Weight:      SpO2: 98% 99% 93% 93%    General: Examined in emergency department.  Appears calm and mildly uncomfortable. Nontoxic. Eyes: PERRL, normal lids, irises  ENT: grossly normal hearing, lips & tongue Neck: no LAD, masses or thyromegaly Cardiovascular: RRR, no m/r/g. No LE edema. Respiratory: Poor air movement. No frank wheezes, rales or rhonchi. Mild increased respiratory effort. It will speak in full sentences. Abdomen: soft, ntnd Skin: no rash or induration seen  Musculoskeletal: grossly normal tone BUE/BLE Psychiatric: grossly normal mood and affect, speech fluent and appropriate Neurologic: grossly non-focal.  Wt Readings from Last 3 Encounters:  06/27/13 68.04 kg (150 lb)  06/18/13 68.04 kg (150 lb)  05/23/13 68.04 kg (150 lb)    Labs on Admission:  Basic Metabolic Panel:  Recent Labs Lab 06/27/13 1155  NA 146  K 3.7  CL 106  CO2 28  GLUCOSE 94  BUN 11  CREATININE 0.78  CALCIUM 8.8     Liver Function Tests:  Recent Labs Lab 06/27/13 1155  AST 16  ALT 6  ALKPHOS 94  BILITOT 0.2*  PROT 7.5  ALBUMIN 3.6   CBC:  Recent Labs Lab 06/27/13 1155  WBC 8.8  NEUTROABS 2.6  HGB 14.6  HCT 42.9  MCV 89.6  PLT 276    Radiological Exams on Admission: Dg Chest Portable 1 View  06/27/2013   CLINICAL DATA:  Shortness of Breath ; history of asthma  EXAM: PORTABLE CHEST - 1 VIEW  COMPARISON:  May 23, 2013  FINDINGS: The lungs are hyperexpanded but clear. Heart size and pulmonary vascularity are normal. No adenopathy. No pneumothorax. No bone lesions.  IMPRESSION: Lungs hyperexpanded; this finding may be consistent with history of chronic asthma. No edema or consolidation.   Electronically Signed   By: Bretta Bang M.D.   On: 06/27/2013 12:07    Principal Problem:   Asthma exacerbation Active Problems:   Asthma, severe persistent   Allergic eosinophilia   Hyper-IgE syndrome   Asthma   Assessment/Plan 1. Asthma exacerbation superimposed on severe asthma. Somewhat improved in the emergency department. Continue steroids, nebulizers, oxygen as needed. 2. Hypereosinophilia. 3. Hyper IgE.    Plan admission for further treatment of asthma exacerbation as above. Continue Singulair. Resume Dulera on discharge.  Code Status: full code  DVT prophylaxis: SCDs Family Communication: none peresent Disposition Plan/Anticipated LOS: admit 1-2 days  Time spent: 50 minutes  Brendia Sacks, MD  [facility] Pager [phone] 06/27/2013, 3:14 PM

## 2013-06-27 NOTE — ED Notes (Signed)
[facility] and no where else.

## 2013-06-27 NOTE — ED Notes (Signed)
Pt is currently sleeping

## 2013-06-27 NOTE — ED Provider Notes (Signed)
CSN: [id number]     Arrival date & time 06/27/13  1133 History  This chart was scribed for Drew LennertJoseph L Sandoval Dorce, MD by Leone PayorSonum Patel, ED Scribe. This patient was seen in room APA04/APA04 and the patient's care was started 11:45 AM.    Chief Complaint  Patient presents with  . Shortness of Breath      Patient is a 38 y.o. male presenting with shortness of breath. The history is provided by the patient. No language interpreter was used.  Shortness of Breath Severity:  Moderate Onset quality:  Sudden Timing:  Constant Progression:  Unchanged Chronicity:  Recurrent Relieved by:  Nothing Associated symptoms: no abdominal pain, no chest pain, no cough, no fever, no headaches, no rash and no sore throat     HPI Comments: Drew Sandoval is a 38 y.o. male who presents to the Emergency Department complaining of sudden onset, constant SOB and chest tightness that began PTA. Pt reports a history of asthma and states this is a flare up. He denies cough, sore throat, fever. He is no longer a smoker.   Past Medical History  Diagnosis Date  . Asthma   . Seasonal allergies    History reviewed. No pertinent past surgical history. Family History  Problem Relation Age of Onset  . Emphysema Father    History  Substance Use Topics  . Smoking status: Former Smoker -- 0.50 packs/day for 11.5 years    Types: Cigarettes    Quit date: 04/09/2010  . Smokeless tobacco: Never Used     Comment: started at age 38.  1/2 ppd.    . Alcohol Use: No    Review of Systems  Constitutional: Negative for fever, appetite change and fatigue.  HENT: Negative for congestion, ear discharge, sinus pressure and sore throat.   Eyes: Negative for discharge.  Respiratory: Positive for chest tightness and shortness of breath. Negative for cough.   Cardiovascular: Negative for chest pain.  Gastrointestinal: Negative for abdominal pain and diarrhea.  Genitourinary: Negative for frequency and hematuria.  Musculoskeletal:  Negative for back pain.  Skin: Negative for rash.  Neurological: Negative for seizures and headaches.  Psychiatric/Behavioral: Negative for hallucinations.      Allergies  Azithromycin; Alvesco; Anoro ellipta; Banana; and Penicillins  Home Medications   Current Outpatient Rx  Name  Route  Sig  Dispense  Refill  . albuterol (PROVENTIL HFA;VENTOLIN HFA) 108 (90 BASE) MCG/ACT inhaler   Inhalation   Inhale 2 puffs into the lungs every 4 (four) hours as needed for wheezing or shortness of breath.   1 Inhaler   prn   . albuterol (PROVENTIL) (2.5 MG/3ML) 0.083% nebulizer solution      USE 1 VIAL IN VEBULIZER EVERY 6 HRS.AS NEEDED FOR SHORTNESS OF BREATH   75 mL   8   . EPINEPHrine (EPIPEN) 0.3 mg/0.3 mL SOAJ injection      Inject into thigh for severe allergic reaction   1 Device   prn   . ibuprofen (ADVIL,MOTRIN) 200 MG tablet   Oral   Take 200 mg by mouth every 6 (six) hours as needed. Pain         . mometasone-formoterol (DULERA) 200-5 MCG/ACT AERO   Inhalation   Inhale 2 puffs into the lungs 2 (two) times daily.   1 Inhaler   5   . montelukast (SINGULAIR) 10 MG tablet      TAKE 1 TABLET BY MOUTH AT BEDTIME   30 tablet   2  BP 129/79  Pulse 90  Temp(Src) 97.5 F (36.4 C) (Oral)  Resp 18  Ht 6\' 3"  (1.905 m)  Wt 150 lb (68.04 kg)  BMI 18.75 kg/m2  SpO2 93% Physical Exam  Nursing note and vitals reviewed. Constitutional: He is oriented to person, place, and time. He appears well-developed.  HENT:  Head: Normocephalic.  Eyes: Conjunctivae and EOM are normal. No scleral icterus.  Neck: Neck supple. No thyromegaly present.  Cardiovascular: Normal rate and regular rhythm.  Exam reveals no gallop and no friction rub.   No murmur heard. Pulmonary/Chest: No stridor. Tachypnea noted. He is in respiratory distress. He has wheezes (moderate wheezing bilaterally). He has no rales. He exhibits no tenderness.  Abdominal: He exhibits no distension. There is no  tenderness. There is no rebound.  Musculoskeletal: Normal range of motion. He exhibits no edema.  Lymphadenopathy:    He has no cervical adenopathy.  Neurological: He is oriented to person, place, and time. He exhibits normal muscle tone. Coordination normal.  Skin: No rash noted. No erythema.  Psychiatric: He has a normal mood and affect. His behavior is normal.    ED Course  Procedures (including critical care time)  DIAGNOSTIC STUDIES: Oxygen Saturation is 91% on RA, low by my interpretation.    COORDINATION OF CARE: 11:45 AM Discussed treatment plan with pt at bedside and pt agreed to plan.   Labs Review Labs Reviewed  CBC WITH DIFFERENTIAL - Abnormal; Notable for the following:    Neutrophils Relative % 30 (*)    Eosinophils Relative 17 (*)    Eosinophils Absolute 1.5 (*)    All other components within normal limits  COMPREHENSIVE METABOLIC PANEL - Abnormal; Notable for the following:    Total Bilirubin 0.2 (*)    All other components within normal limits   Imaging Review Dg Chest Portable 1 View  06/27/2013   CLINICAL DATA:  Shortness of Breath ; history of asthma  EXAM: PORTABLE CHEST - 1 VIEW  COMPARISON:  May 23, 2013  FINDINGS: The lungs are hyperexpanded but clear. Heart size and pulmonary vascularity are normal. No adenopathy. No pneumothorax. No bone lesions.  IMPRESSION: Lungs hyperexpanded; this finding may be consistent with history of chronic asthma. No edema or consolidation.   Electronically Signed   By: Bretta Bang M.D.   On: 06/27/2013 12:07    EKG Interpretation   None       MDM   Final diagnoses:  None  asthma The chart was scribed for me under my direct supervision.  I personally performed the history, physical, and medical decision making and all procedures in the evaluation of this patient.Drew Lennert, MD 06/27/13 [phone]

## 2013-06-28 ENCOUNTER — Encounter: Payer: Self-pay | Admitting: *Deleted

## 2013-06-28 ENCOUNTER — Telehealth: Payer: Self-pay | Admitting: Internal Medicine

## 2013-06-28 LAB — BASIC METABOLIC PANEL
BUN: 11 mg/dL (ref 6–23)
CALCIUM: 9.2 mg/dL (ref 8.4–10.5)
CO2: 23 meq/L (ref 19–32)
CREATININE: 0.58 mg/dL (ref 0.50–1.35)
Chloride: 107 mEq/L (ref 96–112)
GFR calc Af Amer: >90 mL/min (ref >90)
GFR calc non Af Amer: >90 mL/min (ref >90)
GLUCOSE: 103 mg/dL — AB (ref 70–99)
Potassium: 4.4 mEq/L (ref 3.7–5.3)
Sodium: 142 mEq/L (ref 137–147)

## 2013-06-28 LAB — CBC
HCT: 41.3 % (ref 39.0–52.0)
Hemoglobin: 14.2 g/dL (ref 13.0–17.0)
MCH: 30.5 pg (ref 26.0–34.0)
MCHC: 34.4 g/dL (ref 30.0–36.0)
MCV: 88.6 fL (ref 78.0–100.0)
PLATELETS: 283 10*3/uL (ref 150–400)
RBC: 4.66 MIL/uL (ref 4.22–5.81)
RDW: 13.5 % (ref 11.5–15.5)
WBC: 10.5 10*3/uL (ref 4.0–10.5)

## 2013-06-28 MED ORDER — PREDNISONE 10 MG PO TABS: 40.0000 mg | ORAL_TABLET | Freq: Every day | ORAL | Status: DC

## 2013-06-28 MED ORDER — PREDNISONE 20 MG PO TABS: 40.0000 mg | ORAL_TABLET | Freq: Every day | ORAL | Status: DC

## 2013-06-28 NOTE — Discharge Summary (Signed)
Physician Discharge Summary  Drew Sandoval [id number] DOB: 1975/06/28 DOA: 06/27/2013  PCP: Drew Sandoval,[name], MD Pulmonologist: Dr. Jetty Duhamellinton Drew Sandoval  Admit date: 06/27/2013 Discharge date: 06/28/2013  Recommendations for Outpatient Follow-up:  1. Resolution of asthma exacerbation.  Follow-up Information   Follow up with Drew Sandoval,[name], MD In 2 weeks.   Specialty:  Family Medicine   Contact information:   [address] [place] [address] [phone]      Discharge Diagnoses:  1. Asthma exacerbation. 2. Severe persistent asthma. 3. History of hypereosinophilia and hyper IgE  Discharge Condition: Improved Disposition: Home  Diet recommendation: Regular  Filed Weights   06/27/13 1140 06/27/13 1615  Weight: 68.04 kg (150 lb) 68.04 kg (150 lb)    History of present illness:  37yom with severe persistent asthma followed by Dr. [name] presents to the emergency department with increasing shortness of breath. Initial evaluation suggested asthma exacerbation, patient was treated with nebulizer therapy and Solu-Medrol and referred for admission.   Hospital Course:  Patient history with IV steroids, nebulizer treatments and rapidly improved with resolution of dyspnea on exertion and shortness of breath. No hypoxia. No evidence of complicating features. He will be discharged home today, complete steroid taper as an outpatient, he has followup arranged with pulmonology next week.  1. Asthma exacerbation. Much improved today. No hypoxia. No dyspnea on exertion. Improved air movement. Slow steroid taper as an outpatient. 2. Severe persistent asthma 3. Hypereosinophilia 4. Hyper-IgE  Consultants:  none Procedures:  none Antibiotics:  None  Discharge Instructions  Discharge Orders   Future Appointments Provider Department Dept Phone   07/06/2013 9:15 AM Waymon Budgelinton D Young, MD [facility] [phone]   Future Orders Complete By Expires   Activity as tolerated -  No restrictions  As directed    Diet general  As directed    Discharge instructions  As directed    Comments:     Be sure to finish prednisone. Call your physician or seek immediate medical attention for increased shortness of breath, wheezing or worsening of condition.       Medication List         albuterol (2.5 MG/3ML) 0.083% nebulizer solution  Commonly known as:  PROVENTIL  USE 1 VIAL IN VEBULIZER EVERY 6 HRS.AS NEEDED FOR SHORTNESS OF BREATH     albuterol 108 (90 BASE) MCG/ACT inhaler  Commonly known as:  PROVENTIL HFA;VENTOLIN HFA  Inhale 2 puffs into the lungs every 4 (four) hours as needed for wheezing or shortness of breath.     EPINEPHrine 0.3 mg/0.3 mL Soaj injection  Commonly known as:  EPIPEN  Inject into thigh for severe allergic reaction     ibuprofen 200 MG tablet  Commonly known as:  ADVIL,MOTRIN  Take 200 mg by mouth every 6 (six) hours as needed. Pain     mometasone-formoterol 200-5 MCG/ACT Aero  Commonly known as:  DULERA  Inhale 2 puffs into the lungs 2 (two) times daily.     montelukast 10 MG tablet  Commonly known as:  SINGULAIR  TAKE 1 TABLET BY MOUTH AT BEDTIME     predniSONE 10 MG tablet  Commonly known as:  DELTASONE  Take 4 tablets (40 mg total) by mouth daily with breakfast. 2/20-2/22: Take 40 mg by mouth daily. 2/23-2/25: Take 20 mg by mouth daily. 2/26-2/28: Take 10 mg by mouth daily then stop.  Start taking on:  06/29/2013       Allergies  Allergen Reactions  . Azithromycin Rash  .  Alvesco [Ciclesonide]     Possible reaction- rash  . Anoro Ellipta [Umeclidinium-Vilanterol]     Possible- rash  . Banana Swelling    Throat swelling   . Penicillins Rash    The results of significant diagnostics from this hospitalization (including imaging, microbiology, ancillary and laboratory) are listed below for reference.    Significant Diagnostic Studies: Dg Chest Portable 1 View  06/27/2013   CLINICAL DATA:  Shortness of Breath ; history of  asthma  EXAM: PORTABLE CHEST - 1 VIEW  COMPARISON:  May 23, 2013  FINDINGS: The lungs are hyperexpanded but clear. Heart size and pulmonary vascularity are normal. No adenopathy. No pneumothorax. No bone lesions.  IMPRESSION: Lungs hyperexpanded; this finding may be consistent with history of chronic asthma. No edema or consolidation.   Electronically Signed   By: Bretta Bang M.D.   On: 06/27/2013 12:07    Labs: Basic Metabolic Panel:  Recent Labs Lab 06/27/13 1155 06/28/13 0512  NA 146 142  K 3.7 4.4  CL 106 107  CO2 28 23  GLUCOSE 94 103*  BUN 11 11  CREATININE 0.78 0.58  CALCIUM 8.8 9.2   Liver Function Tests:  Recent Labs Lab 06/27/13 1155  AST 16  ALT 6  ALKPHOS 94  BILITOT 0.2*  PROT 7.5  ALBUMIN 3.6   CBC:  Recent Labs Lab 06/27/13 1155 06/28/13 0512  WBC 8.8 10.5  NEUTROABS 2.6  --   HGB 14.6 14.2  HCT 42.9 41.3  MCV 89.6 88.6  PLT 276 283    Principal Problem:   Asthma exacerbation Active Problems:   Asthma, severe persistent   Allergic eosinophilia   Hyper-IgE syndrome   Asthma   Time coordinating discharge: 20 minutes  Signed:  Brendia Sacks, MD [facility] 06/28/2013, 10:32 AM

## 2013-06-28 NOTE — Progress Notes (Signed)
UR chart review completed.  

## 2013-06-28 NOTE — Progress Notes (Signed)
Patient states  Understanding of discharge instructions, prescription given.

## 2013-06-28 NOTE — Progress Notes (Signed)
  PROGRESS NOTE  Drew Sandoval [id number] DOB: Sep 18, 1975 DOA: 06/27/2013 PCP: [name],[name], MD  Summary: 37yom with severe persistent asthma followed by Dr. [name] presents to the emergency department with increasing shortness of breath. Initial evaluation suggested asthma exacerbation, patient was treated with nebulizer therapy and Solu-Medrol and referred for admission.   Assessment/Plan: 1. Asthma exacerbation. Much improved today. No hypoxia. No dyspnea on exertion. Improved air movement. Slow steroid taper as an outpatient. 2. Severe persistent asthma 3. Hypereosinophilia 4. Hyper-IgE   Discharge home today on steroid taper.  He has followup appointment with Dr. [name] arrange for next week.  Brendia Sacksaniel Winford Hehn, MD  [facility]  Pager [phone] If 7PM-7AM, please contact night-coverage at [email or web address], password [email or web address] 06/28/2013, 10:21 AM  LOS: 1 day   Consultants:  none  Procedures:  none  Antibiotics:  none  HPI/Subjective: Feels much better today. Ambulating without shortness of breath. No difficulty breathing this morning. No issues overnight.  Objective: Filed Vitals:   06/27/13 1951 06/27/13 2002 06/28/13 0438 06/28/13 0704  BP:  154/83 132/77   Pulse:  85 96   Temp:  97.7 F (36.5 C) 98.4 F (36.9 C)   TempSrc:  Oral Oral   Resp:  20 20   Height:      Weight:      SpO2: 94% 95% 93% 94%    Intake/Output Summary (Last 24 hours) at 06/28/13 1021 Last data filed at 06/27/13 1828  Gross per 24 hour  Intake      0 ml  Output      0 ml  Net      0 ml     Filed Weights   06/27/13 1140 06/27/13 1615  Weight: 68.04 kg (150 lb) 68.04 kg (150 lb)    Exam:   Afebrile, vital signs stable. No hypoxia.Respiratory: Clear to auscultation bilaterally. No wheezes, rales, rhonchi. Normal respiratory effort..  Gen. Appears calm and comfortable. Speech fluent and clear.  Cardiovascular. Regular rate and rhythm. No murmur, rub or  gallop.  Respiratory. Decreased breath sounds. No frank wheezes, rales or rhonchi. Normal respiratory effort. Improved air movement today.  Psychiatric. Grossly normal mood and affect.  Data Reviewed:  Basic metabolic panel unremarkable. CBC unremarkable.  Scheduled Meds: . albuterol  2.5 mg Nebulization QID  . methylPREDNISolone (SOLU-MEDROL) injection  40 mg Intravenous Q6H  . montelukast  10 mg Oral QHS  . sodium chloride  3 mL Intravenous Q12H   Continuous Infusions:   Principal Problem:   Asthma exacerbation Active Problems:   Asthma, severe persistent   Allergic eosinophilia   Hyper-IgE syndrome   Asthma

## 2013-06-28 NOTE — Telephone Encounter (Signed)
Ok to give him his work note.

## 2013-06-28 NOTE — Telephone Encounter (Signed)
Spoke with the pt  He states had to use FMLA day for asthma last night  He had a flare, but feeling better today and wants to return to work  Needs note that he can return today with no restrictions  Please advise thanks!

## 2013-06-28 NOTE — Telephone Encounter (Signed)
Note is ready to pick up  Spoke with the pt and notified of this

## 2013-07-06 ENCOUNTER — Ambulatory Visit: Payer: 59 | Admitting: Internal Medicine

## 2013-07-06 ENCOUNTER — Ambulatory Visit (INDEPENDENT_AMBULATORY_CARE_PROVIDER_SITE_OTHER): Payer: 59

## 2013-07-06 DIAGNOSIS — J309 Allergic rhinitis, unspecified: Secondary | ICD-10-CM

## 2013-07-09 ENCOUNTER — Telehealth: Payer: Self-pay | Admitting: Internal Medicine

## 2013-07-09 NOTE — Telephone Encounter (Signed)
Pt came by to pick up letter. Pt is very mad that it was not taken care of today. Pt wants to pick this up first thing in the morning.

## 2013-07-09 NOTE — Telephone Encounter (Signed)
[place]rista came to discuss as pt had come to the office to pick up work note A work note was last done on 2.19.15 that pt never picked up from the office (was found in the brown folder up front)  Last ov w/ CDY 10.27.14, follow up in 4 months >> appt scheduled for 3.11.15  CDY not in the office this afternoon after 3pm and unfortunately work note was not completed and we cannot do this in CDY's absence without his approval  Will forward to CDY to be addressed when he returns to the office in the morning.  Dr [name] please advise if pt may have work note, thank you.

## 2013-07-09 NOTE — Telephone Encounter (Signed)
CDY stopped by the office -- he is actually scheduled out of the office tomorrow but did okay for pt's work note. Work note has been done for 3.2.15 Detailed message left on pt's voicemail apologizing for the inconvenience and informing pt that CDY okayed for work note and it is being placed up front to be picked up at his convenience. Will sign off.

## 2013-07-10 NOTE — Telephone Encounter (Signed)
Pt here to pick up letter, stated that it is incorrect and needs to excuse him for 2/28, 3/2 with no restriction and he may return to work [employer]. CDY not in office, but as this was discussed with him yesterday evening, will go ahead and okay letter. Done and handed to pt by [place].

## 2013-07-13 ENCOUNTER — Ambulatory Visit (INDEPENDENT_AMBULATORY_CARE_PROVIDER_SITE_OTHER): Payer: 59

## 2013-07-13 DIAGNOSIS — J309 Allergic rhinitis, unspecified: Secondary | ICD-10-CM

## 2013-07-16 ENCOUNTER — Telehealth: Payer: Self-pay | Admitting: Internal Medicine

## 2013-07-16 NOTE — Telephone Encounter (Signed)
Spoke with pt briefly until phone was disconnected.  Tried to call back but had to LM to call back.

## 2013-07-17 ENCOUNTER — Emergency Department (HOSPITAL_COMMUNITY)
Admission: EM | Admit: 2013-07-17 | Discharge: 2013-07-17 | Disposition: A | Discharge status: Home / Self Care | Payer: 59 | Attending: Emergency Medicine | Admitting: Emergency Medicine

## 2013-07-17 ENCOUNTER — Emergency Department (HOSPITAL_COMMUNITY): Payer: 59

## 2013-07-17 ENCOUNTER — Encounter (HOSPITAL_COMMUNITY): Payer: Self-pay | Admitting: Emergency Medicine

## 2013-07-17 DIAGNOSIS — Z880 Allergy status to penicillin: Secondary | ICD-10-CM | POA: Insufficient documentation

## 2013-07-17 DIAGNOSIS — IMO0002 Reserved for concepts with insufficient information to code with codable children: Secondary | ICD-10-CM | POA: Insufficient documentation

## 2013-07-17 DIAGNOSIS — Z79899 Other long term (current) drug therapy: Secondary | ICD-10-CM | POA: Insufficient documentation

## 2013-07-17 DIAGNOSIS — J45901 Unspecified asthma with (acute) exacerbation: Secondary | ICD-10-CM

## 2013-07-17 DIAGNOSIS — Z87891 Personal history of nicotine dependence: Secondary | ICD-10-CM | POA: Insufficient documentation

## 2013-07-17 MED ORDER — ALBUTEROL SULFATE (2.5 MG/3ML) 0.083% IN NEBU
5.0000 mg | INHALATION_SOLUTION | Freq: Once | RESPIRATORY_TRACT | Status: AC
  Administered 2013-07-17: 5 mg via RESPIRATORY_TRACT
  Filled 2013-07-17: qty 6

## 2013-07-17 MED ORDER — ALBUTEROL SULFATE (2.5 MG/3ML) 0.083% IN NEBU
INHALATION_SOLUTION | RESPIRATORY_TRACT | Status: AC
  Administered 2013-07-17: 2.5 mg via RESPIRATORY_TRACT
  Filled 2013-07-17: qty 3

## 2013-07-17 MED ORDER — SULFAMETHOXAZOLE-TRIMETHOPRIM 800-160 MG PO TABS: 1.0000 | ORAL_TABLET | Freq: Two times a day (BID) | ORAL | Status: DC

## 2013-07-17 MED ORDER — METHYLPREDNISOLONE SODIUM SUCC 125 MG IJ SOLR
125.0000 mg | Freq: Once | INTRAMUSCULAR | Status: AC
  Administered 2013-07-17: 125 mg via INTRAVENOUS
  Filled 2013-07-17: qty 2

## 2013-07-17 MED ORDER — ALBUTEROL SULFATE (2.5 MG/3ML) 0.083% IN NEBU
2.5000 mg | INHALATION_SOLUTION | Freq: Once | RESPIRATORY_TRACT | Status: AC
  Administered 2013-07-17: 2.5 mg via RESPIRATORY_TRACT
  Filled 2013-07-17: qty 3

## 2013-07-17 MED ORDER — ALBUTEROL SULFATE (2.5 MG/3ML) 0.083% IN NEBU
2.5000 mg | INHALATION_SOLUTION | Freq: Once | RESPIRATORY_TRACT | Status: AC
  Administered 2013-07-17: 2.5 mg via RESPIRATORY_TRACT

## 2013-07-17 NOTE — Discharge Instructions (Signed)
Asthma, Adult Asthma is a recurring condition in which the airways tighten and narrow. Asthma can make it difficult to breathe. It can cause coughing, wheezing, and shortness of breath. Asthma episodes (also called asthma attacks) range from minor to life-threatening. Asthma cannot be cured, but medicines and lifestyle changes can help control it. CAUSES Asthma is believed to be caused by inherited (genetic) and environmental factors, but its exact cause is unknown. Asthma may be triggered by allergens, lung infections, or irritants in the air. Asthma triggers are different for each person. Common triggers include:   Animal dander.  Dust mites.  Cockroaches.  Pollen from trees or grass.  Mold.  Smoke.  Air pollutants such as dust, household cleaners, hair sprays, aerosol sprays, paint fumes, strong chemicals, or strong odors.  Cold air, weather changes, and winds (which increase molds and pollens in the air).  Strong emotional expressions such as crying or laughing hard.  Stress.  Certain medicines (such as aspirin) or types of drugs (such as beta-blockers).  Sulfites in foods and drinks. Foods and drinks that may contain sulfites include dried fruit, potato chips, and sparkling grape juice.  Infections or inflammatory conditions such as the flu, a cold, or an inflammation of the nasal membranes (rhinitis).  Gastroesophageal reflux disease (GERD).  Exercise or strenuous activity. SYMPTOMS Symptoms may occur immediately after asthma is triggered or many hours later. Symptoms include:  Wheezing.  Excessive nighttime or early morning coughing.  Frequent or severe coughing with a common cold.  Chest tightness.  Shortness of breath. DIAGNOSIS  The diagnosis of asthma is made by a review of your medical history and a physical exam. Tests may also be performed. These may include:  Lung function studies. These tests show how much air you breath in and out.  Allergy  tests.  Imaging tests such as X-rays. TREATMENT  Asthma cannot be cured, but it can usually be controlled. Treatment involves identifying and avoiding your asthma triggers. It also involves medicines. There are 2 classes of medicine used for asthma treatment:   Controller medicines. These prevent asthma symptoms from occurring. They are usually taken every day.  Reliever or rescue medicines. These quickly relieve asthma symptoms. They are used as needed and provide short-term relief. Your health care provider will help you create an asthma action plan. An asthma action plan is a written plan for managing and treating your asthma attacks. It includes a list of your asthma triggers and how they may be avoided. It also includes information on when medicines should be taken and when their dosage should be changed. An action plan may also involve the use of a device called a peak flow meter. A peak flow meter measures how well the lungs are working. It helps you monitor your condition. HOME CARE INSTRUCTIONS   Take medicine as directed by your health care provider. Speak with your health care provider if you have questions about how or when to take the medicines.  Use a peak flow meter as directed by your health care provider. Record and keep track of readings.  Understand and use the action plan to help minimize or stop an asthma attack without needing to seek medical care.  Control your home environment in the following ways to help prevent asthma attacks:  Do not smoke. Avoid being exposed to secondhand smoke.  Change your heating and air conditioning filter regularly.  Limit your use of fireplaces and wood stoves.  Get rid of pests (such as roaches and   mice) and their droppings.  Throw away plants if you see mold on them.  Clean your floors and dust regularly. Use unscented cleaning products.  Try to have someone else vacuum for you regularly. Stay out of rooms while they are being  vacuumed and for a short while afterward. If you vacuum, use a dust mask from a hardware store, a double-layered or microfilter vacuum cleaner bag, or a vacuum cleaner with a HEPA filter.  Replace carpet with wood, tile, or vinyl flooring. Carpet can trap dander and dust.  Use allergy-proof pillows, mattress covers, and box spring covers.  Wash bed sheets and blankets every week in hot water and dry them in a dryer.  Use blankets that are made of polyester or cotton.  Clean bathrooms and kitchens with bleach. If possible, have someone repaint the walls in these rooms with mold-resistant paint. Keep out of the rooms that are being cleaned and painted.  Wash hands frequently. SEEK MEDICAL CARE IF:   You have wheezing, shortness of breath, or a cough even if taking medicine to prevent attacks.  The colored mucus you cough up (sputum) is thicker than usual.  Your sputum changes from clear or white to yellow, green, gray, or bloody.  You have any problems that may be related to the medicines you are taking (such as a rash, itching, swelling, or trouble breathing).  You are using a reliever medicine more than 2 3 times per week.  Your peak flow is still at 50 79% of you personal best after following your action plan for 1 hour. SEEK IMMEDIATE MEDICAL CARE IF:   You seem to be getting worse and are unresponsive to treatment during an asthma attack.  You are short of breath even at rest.  You get short of breath when doing very little physical activity.  You have difficulty eating, drinking, or talking due to asthma symptoms.  You develop chest pain.  You develop a fast heartbeat.  You have a bluish color to your lips or fingernails.  You are lightheaded, dizzy, or faint.  Your peak flow is less than 50% of your personal best.  You have a fever or persistent symptoms for more than 2 3 days.  You have a fever and symptoms suddenly get worse. MAKE SURE YOU:   Understand these  instructions.  Will watch your condition.  Will get help right away if you are not doing well or get worse. Document Released: 04/26/2005 Document Revised: 12/27/2012 Document Reviewed: 11/23/2012 [employer] Patient Information 2014 [employer].  

## 2013-07-17 NOTE — ED Notes (Signed)
Pt has HX of asthma, noticed worsening symptoms this weekend, has used inhaler and breathing treatments at home with no improvement.

## 2013-07-17 NOTE — ED Notes (Signed)
Pt's sats 93-94% on room air while ambulating.

## 2013-07-17 NOTE — ED Provider Notes (Signed)
CSN: [id number]     Arrival date & time 07/17/13  0815 History   First MD Initiated Contact with Patient 07/17/13 0830     Chief Complaint  Patient presents with  . Asthma     (Consider location/radiation/quality/duration/timing/severity/associated sxs/prior Treatment) Patient is a 38 y.o. male presenting with wheezing. The history is provided by the patient.  Wheezing Severity:  Moderate Severity compared to prior episodes:  Similar Onset quality:  Gradual Duration:  3 days Timing:  Constant Progression:  Unchanged Chronicity:  Recurrent Context comment:  Patient has hx of asthma Relieved by:  Nothing Worsened by:  Activity Ineffective treatments:  Beta-agonist inhaler and steroid inhaler Associated symptoms: chest tightness, cough and shortness of breath   Associated symptoms: no chest pain, no ear pain, no fatigue, no fever, no headaches, no orthopnea, no PND, no rash, no rhinorrhea, no sore throat, no sputum production, no stridor and no swollen glands   Cough:    Cough characteristics:  Non-productive and harsh   Sputum characteristics:  Yellow and white   Severity:  Moderate   Onset quality:  Gradual   Timing:  Intermittent   Progression:  Unchanged   Chronicity:  Recurrent Shortness of breath:    Severity:  Mild   Onset quality:  Gradual   Timing:  Intermittent   Progression:  Waxing and waning Risk factors: not exposed to toxic fumes, no prior hospitalizations and no prior intubations       Past Medical History  Diagnosis Date  . Asthma   . Seasonal allergies    Past Surgical History  Procedure Laterality Date  . None     Family History  Problem Relation Age of Onset  . Emphysema Father    History  Substance Use Topics  . Smoking status: Former Smoker -- 0.50 packs/day for 11.5 years    Types: Cigarettes    Quit date: 04/09/2010  . Smokeless tobacco: Never Used     Comment: started at age 73.  1/2 ppd.    . Alcohol Use: No    Review of Systems   Constitutional: Negative for fever, chills, activity change, appetite change and fatigue.  HENT: Positive for congestion. Negative for ear pain, facial swelling, rhinorrhea, sore throat and trouble swallowing.   Eyes: Negative for visual disturbance.  Respiratory: Positive for cough, chest tightness, shortness of breath and wheezing. Negative for sputum production and stridor.   Cardiovascular: Negative for chest pain, orthopnea, leg swelling and PND.  Gastrointestinal: Negative for nausea and vomiting.  Genitourinary: Negative for dysuria and hematuria.  Musculoskeletal: Negative for neck pain and neck stiffness.  Skin: Negative.  Negative for rash.  Neurological: Negative for dizziness, weakness, numbness and headaches.  Hematological: Negative for adenopathy.  Psychiatric/Behavioral: Negative for confusion.  All other systems reviewed and are negative.      Allergies  Azithromycin; Alvesco; Anoro ellipta; Banana; and Penicillins  Home Medications   Current Outpatient Rx  Name  Route  Sig  Dispense  Refill  . albuterol (PROVENTIL HFA;VENTOLIN HFA) 108 (90 BASE) MCG/ACT inhaler   Inhalation   Inhale 2 puffs into the lungs every 4 (four) hours as needed for wheezing or shortness of breath.   1 Inhaler   prn   . albuterol (PROVENTIL) (2.5 MG/3ML) 0.083% nebulizer solution   Nebulization   Take 2.5 mg by nebulization every 6 (six) hours as needed for wheezing or shortness of breath.         Marland Kitchen ibuprofen (ADVIL,MOTRIN) 200 MG tablet  Oral   Take 200 mg by mouth every 6 (six) hours as needed. Pain         . mometasone-formoterol (DULERA) 200-5 MCG/ACT AERO   Inhalation   Inhale 2 puffs into the lungs 2 (two) times daily.   1 Inhaler   5   . montelukast (SINGULAIR) 10 MG tablet   Oral   Take 10 mg by mouth at bedtime.         Marland Kitchen. EPINEPHrine (EPIPEN) 0.3 mg/0.3 mL SOAJ injection      Inject into thigh for severe allergic reaction   1 Device   prn    BP 138/91   Pulse 77  Temp(Src) 98.2 F (36.8 C) (Oral)  Resp 18  Ht 6\' 3"  (1.905 m)  Wt 150 lb (68.04 kg)  BMI 18.75 kg/m2  SpO2 99% Physical Exam  Nursing note and vitals reviewed. Constitutional: He is oriented to person, place, and time. He appears well-developed and well-nourished. No distress.  HENT:  Head: Normocephalic and atraumatic.  Right Ear: Tympanic membrane and ear canal normal.  Left Ear: Tympanic membrane and ear canal normal.  Mouth/Throat: Uvula is midline, oropharynx is clear and moist and mucous membranes are normal. No oropharyngeal exudate.  Eyes: EOM are normal. Pupils are equal, round, and reactive to light.  Neck: Normal range of motion, full passive range of motion without pain and phonation normal. Neck supple.  Cardiovascular: Normal rate, regular rhythm, normal heart sounds and intact distal pulses.   No murmur heard. Pulmonary/Chest: No accessory muscle usage or stridor. Tachypnea noted. No respiratory distress. He has decreased breath sounds. He has wheezes. He has rhonchi. He has no rales. He exhibits no tenderness.  Coarse lungs sounds bilaterally with scattered inspiratory and expiratory wheezing.  No rales   Abdominal: Soft. He exhibits no distension. There is no tenderness. There is no rebound.  Musculoskeletal: Normal range of motion. He exhibits no edema.  Lymphadenopathy:    He has no cervical adenopathy.  Neurological: He is alert and oriented to person, place, and time. He exhibits normal muscle tone. Coordination normal.  Skin: Skin is warm and dry.    ED Course  Procedures (including critical care time) Labs Review Labs Reviewed - No data to display Imaging Review Dg Chest 2 View  07/17/2013   CLINICAL DATA:  Asthma and chest tightness  EXAM: CHEST  2 VIEW  COMPARISON:  June 27, 2013  FINDINGS: Lungs are mildly hyperexpanded, a finding that may be seen with asthma. There is no edema or consolidation. The heart size and pulmonary vascularity  are normal. There is some slight central interstitial and peribronchial thickening which may be seen with chronic asthma. No adenopathy. No bone lesions.  IMPRESSION: Central interstitial and peribronchial thickening as well as mild a per expansion, all findings at may be seen with asthma. Superimposed acute bronchitis may well be present as well. No consolidation.   Electronically Signed   By: Bretta BangWilliam  Woodruff M.D.   On: 07/17/2013 09:39     EKG Interpretation None      MDM   Final diagnoses:  Asthma exacerbation   patiet with hx of asthma and previous exacerbations, no recent hospital admissions.  Has appt with pulmonologist tomorrow.  He is feeling much better after nebs.  Discussed XR findings with pt and EDP.  Will treat with antibiotics and pt was given steroids in dept and also had recent oral steroid taper, and since pt has appt tomorrow, I will not prescribe  additional steriods at this time.    Patient ambulated in the dept w/o difficulty.  Mantained O2 sat of 94% on RA while ambulating.  Lung sounds improved, and pt is feeling much better. Pt understands and agrees to care plan and advised to return if needed.  Will prescribe z-pack.    The patient appears reasonably screened and/or stabilized for discharge and I doubt any other medical condition or other [facility] requiring further screening, evaluation, or treatment in the ED at this time prior to discharge.   Kaedance Magos L. Trisha Mangle, PA-C 07/19/13 1938

## 2013-07-17 NOTE — Telephone Encounter (Signed)
I spoke with the pt and he states he no longer needs any prednisone. Nothing further needed.Drew CurieJennifer Castillo, CMA

## 2013-07-18 ENCOUNTER — Ambulatory Visit (INDEPENDENT_AMBULATORY_CARE_PROVIDER_SITE_OTHER): Payer: 59 | Admitting: Internal Medicine

## 2013-07-18 ENCOUNTER — Encounter: Payer: Self-pay | Admitting: Internal Medicine

## 2013-07-18 VITALS — BP 118/76 | HR 70 | Ht 75.0 in | Wt 150.8 lb

## 2013-07-18 DIAGNOSIS — J4550 Severe persistent asthma, uncomplicated: Secondary | ICD-10-CM

## 2013-07-18 DIAGNOSIS — J45909 Unspecified asthma, uncomplicated: Secondary | ICD-10-CM

## 2013-07-18 NOTE — Progress Notes (Signed)
Patient ID: Carlus PavlovMarshall Hymas, male    DOB: July 06, 1975, 38 y.o.   MRN: [id number]  HPI 11/04/10- 38 yo former smoker followed for severe asthma, chronic sinusitis, hype-reosinophilia and hyper IgE Sent to me by Dr [name] for allergy evaluation. 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 Allergy profile- 08/11/10 IgE 1,747 with broad specific elevation Last here- August 31, 2010.- note reviewed. Since last here has been in hospital again, getting out 2 weeks ago. Feels well today.  Comes today as planned for allergy skin testing Skin test: Strongly positive especially grass, tree, dust mite  01/04/11- 89 yo former smoker followed for severe asthma, chronic sinusitis, hyper-eosinophilia and hyper IgE.  wife is with him Sent to me by Dr [name] for allergy evaluation. 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 Allergy vaccine was started 11/09/2010 and is building without problems He was still on prednisone when he saw Dr [name] recently and at that time he seemed improved after a recent exacerbation- 2 weeks ago. About 2 weeks after their visit he began coughing again. He hasn't noted any unusual exposure at work, or any sense that he had a cold.  His last IgE was 1747.  He doesn't feel "sick"- just tight and wheezy.  02/18/11-  38 yo former smoker followed for severe asthma, chronic sinusitis, hyper-eosinophilia and hyper IgE.  wife is with him Cooler weather is more comfortable for him. He continues to build allergy vaccine, now at 1:500, without problems. Some days continued to be better than others. 2 days ago thought his eyes were starting to itch and took Benadryl. We discussed comparison antihistamines. Not much sneezing. He has not needed his rescue inhaler in a long time but continues prednisone 20 mg daily. We educated on prednisone again.  04/20/11- 38  yo former smoker followed for severe asthma, chronic sinusitis, hyper-eosinophilia and hyper IgE.  wife is with him Went to ER 3 days ago- got neb and prednisone 60 mg x1. He finished our last taper yesterday. Has had flu shot. Bending over, long walks or climbing stairs, and long work shifts all make him short of breath with wheezing. He worked last night and now feels "65%". He does not think he has an acute cold or infection. He was last really well about 5 days ago. He denies heartburn. He continues building allergy vaccine now at 1:50, without problems. IgE level has been out of range for Xolair.  07/19/11- 38 yo former smoker followed for severe asthma, chronic sinusitis, hyper-eosinophilia and hyper IgE. Separated from wife have now living with his mother. Asthma control is "a whole lot better". He has not missed work. Did have to go to emergency room for kidney stone. Questions allergy vaccine causing "dark spots" on his neck. Using prednisone only occasionally. No routine wheeze, does short of breath at times.  11/18/11- 38 yo former smoker followed for severe asthma, chronic sinusitis, hyper-eosinophilia and hyper IgE. Went to ER for approximately 4 asthma attacks in the past month; He has continued allergy vaccine at 1:50. He is needed his nebulizer 2 or 3 times daily and occasional rescue inhaler. He denies infection or change in workplace exposure. He does not recognize reflux. He usually flares as soon as he finishes the prednisone taper so he and his wife asked about maintenance prednisone which we  discussed. Carefully. Sinus congestion tends to parallel his asthma episodes but he denies purulent nasal discharge or headache. We again discussed the significance of his elevated IgE and eosinophil levels. His IgE has been too high for Xolair but we will reassess. CXR 11/11/11-  IMPRESSION: No active disease. Hyperinflation again noted.  Original Report Authenticated By: Natasha Mead, M.D.    02/07/12- 56 yo former smoker followed for severe asthma, chronic sinusitis, hyper-eosinophilia and hyper IgE. FOLLOWS FOR: post hospital at [facility] 924 through 02/04/2012. DC summary reviewed. Discharge diagnosis asthma. Asthma began during the night. He took his own nebulizer, felt better and tried to go to work but had to leave. He had presented to [facility] ER where he was treated with 3 nebulizer treatments and sent out. He went from there to the [facility] emergency room. Discharged on a steroid taper. They comment in the summary that he was significantly depressed because he had watched his father died of asthma. He admits today that he had not told us of that history before. He feels better now but still wheezing and washed out as he tapers prednisone. He denies any sense of reflux or any recognized triggering experience. In the past he had thought there was something associated with his work place as discussed in earlier notes. He comments today that he can't breathe if he sleeps on his left side. There is no history of heart disease. He quit allergy vaccine in April, 2013.  03/21/12- 34 yo former smoker followed for severe asthma, chronic sinusitis, hyper-eosinophilia and hyper IgE. FOLLOWS FOR: states that breathing is doing good, still gets allergy shots 1:50 GH. He says he is doing well since her last call for prednisone on November 4. He had to Miss work for a day and needed a note. Missed one day last week as well. He says his job is safe and he actually has most asthma flares at home, not at work. Denies any infection now. Tends to wake up in the morning wheezing without any sense of acid reflux. He can usually tell at bedtime if he is going to have trouble during the night or in the morning. [name] is cost prohibitive. Uses nebulizer about twice daily. We are asking for records release so [facility] consent as results of cultures and chest x-ray  report.  05/22/12- 51 yo former smoker followed for severe asthma, chronic sinusitis, hyper-eosinophilia and hyper IgE.  Wife here FOLLOWS FOR: breathing was doing bad earlier today but he took a breathing tx and that helped. denies chest pain,  chest tightness or increased SOB. Last pred yeterday- we did steroid safety talk again. Started feeling tighter last night- blames"getting hot". Used his neb. CXR 02/01/12 IMPRESSION:  Peribronchial thickening and hyperlucency of the lungs compatible  with history of asthma.  No acute abnormalities.  Original Report Authenticated By: Lollie Marrow, M.D.   08/21/12- 7 yo former smoker followed for severe asthma, chronic sinusitis, hyper-eosinophilia and hyper IgE- too high for Xolair.   FOLLOWS FOR: went to hospital in [place] last night-asthmatic bronchitis. Alot of wheezing, cough; was given Zpak(has to get filled today) Has not taken allergy injection in a while due to illness ER visit in [place] last night for exacerbation of asthma again. Treated with antibiotic and still tightly wheezing. He has continued allergy vaccine 1:50 GH with no reactions. He is only clear when he is on prednisone.  09/19/12- 39 yo former smoker followed for severe asthma, chronic  sinusitis, hyper-eosinophilia and hyper IgE- too high for Xolair.   FOLLOWS FOR: pt states he has good and bad days with asthma flare ups-more good than bad though. No Er visits since last here. Not missing work. Not reacting to his allergy shots 1:50 GH.Discussed possibility of moving up to 1:10.   01/22/13- 5 yo former smoker followed for severe asthma, chronic sinusitis, hyper-eosinophilia and hyper IgE- too high for Xolair.  FOLLOWS FOR:  Waking up in nights and having asthma attacks x1 month. Also, having some snoring Tell him he has apnea. Wheezing is worse at night. Denies reflux and denies infection. Says he now feels "fine" at work.  03/05/13- 73 yo former smoker followed for severe asthma,  chronic sinusitis, hyper-eosinophilia and hyper IgE- too high for Xolair.  FOLLOWS FOR: denies any recent asthma attacks or use of prednisone; still on allergy vaccine 1:50 GH. He says he is doing very well since last here with no prednisone in 6 weeks. It may help with that the weather is cooler. He's working the same job, but no longer working with [employer]  07/18/13- 8 yo former smoker followed for severe asthma, chronic sinusitis, hyper-eosinophilia and hyper IgE- too high for Xolair.  FOLLOWS FOR:  Cough with yellow/clear mucus.  Denies wheezing and sob.   Allergy Vaccine 1:50  doing well Emergency room for acute bronchitis after a cold, treated with Z-Pak. Bothersome wheezy cough is usually dry. CXR 07/17/13 IMPRESSION:  Central interstitial and peribronchial thickening as well as mild a  per expansion, all findings at may be seen with asthma. Superimposed  acute bronchitis may well be present as well. No consolidation.  Electronically Signed  By: Bretta Bang M.D.  On: 07/17/2013 09:39  Review of Systems- see HPI Constitutional:   No weight loss, night sweats,  Fevers, chills, fatigue, lassitude. HEENT:   No headaches,  Difficulty swallowing,  Tooth/dental problems,  Sore throat,                No sneezing, ear ache,  + some sniffing, throat itches CV:  No chest pain, orthopnea, PND, swelling in lower extremities, anasarca, dizziness, palpitations GI  No heartburn, indigestion, abdominal pain, nausea, vomiting,  Resp: +  wheeze.  No coughing up of blood.  No change in color of mucus.   Skin: no acute rash GU: no dysuria,  MS:  No joint pain or swelling.   Psych:  No change in mood or affect. No depression or anxiety.  No memory loss.  Objective:   Physical Exam General- Alert, Oriented, Affect-+calm, Distress- none acute;  Tall, trim Skin- clear Lymphadenopathy- none Head- atraumatic            Eyes- Gross vision intact, PERRLA, conjunctivae clear secretions             Ears- Hearing, canals normal            Nose- Clear, no-Septal dev, mucus, polyps, erosion, perforation             Throat- Mallampati II , mucosa clear , drainage- none, tonsils- atrophic Neck- flexible , trachea midline, no stridor , thyroid nl, carotid no bruit Chest - symmetrical excursion , unlabored           Heart/CV- RRR , no murmur , no gallop  , no rub, nl s1 s2                           - JVD-  none , edema- none, stasis changes- none, varices- none           Lung-   +coarse without wheeze, unlabored, no cough , dullness-none, rub- none,            Chest wall-  Abd-  Br/ Gen/ Rectal- Not done, not indicated Extrem- cyanosis- none, clubbing, none, atrophy- none, strength- nl Neuro- grossly intact to observation

## 2013-07-18 NOTE — Patient Instructions (Addendum)
Ok to finish the Zpak antibiotic  We will hold off on giving prednisone since you are doing pretty well  Call us as needed, so you don't run out of your regular medicines  We will increase the strength of your allergy shots as planned, with the next order.

## 2013-07-19 NOTE — ED Provider Notes (Signed)
Medical screening examination/treatment/procedure(s) were performed by non-physician practitioner and as supervising physician I was immediately available for consultation/collaboration.   EKG Interpretation None        Sandee Bernath L Gabriell Daigneault, MD 07/19/13 2247 

## 2013-07-23 ENCOUNTER — Emergency Department (HOSPITAL_COMMUNITY): Payer: 59

## 2013-07-23 ENCOUNTER — Encounter (HOSPITAL_COMMUNITY): Payer: Self-pay | Admitting: Emergency Medicine

## 2013-07-23 ENCOUNTER — Inpatient Hospital Stay (HOSPITAL_COMMUNITY)
Admission: EM | Admit: 2013-07-23 | Discharge: 2013-07-27 | DRG: 202 | Disposition: A | Discharge status: Home / Self Care | Payer: 59 | Attending: Family Medicine | Admitting: Family Medicine

## 2013-07-23 DIAGNOSIS — J9601 Acute respiratory failure with hypoxia: Secondary | ICD-10-CM

## 2013-07-23 DIAGNOSIS — Z87891 Personal history of nicotine dependence: Secondary | ICD-10-CM | POA: Diagnosis not present

## 2013-07-23 DIAGNOSIS — Z23 Encounter for immunization: Secondary | ICD-10-CM

## 2013-07-23 DIAGNOSIS — J45902 Unspecified asthma with status asthmaticus: Principal | ICD-10-CM | POA: Diagnosis present

## 2013-07-23 DIAGNOSIS — J45909 Unspecified asthma, uncomplicated: Secondary | ICD-10-CM

## 2013-07-23 DIAGNOSIS — J4550 Severe persistent asthma, uncomplicated: Secondary | ICD-10-CM | POA: Diagnosis present

## 2013-07-23 DIAGNOSIS — J9600 Acute respiratory failure, unspecified whether with hypoxia or hypercapnia: Secondary | ICD-10-CM | POA: Diagnosis present

## 2013-07-23 DIAGNOSIS — R0602 Shortness of breath: Secondary | ICD-10-CM | POA: Diagnosis not present

## 2013-07-23 DIAGNOSIS — J45901 Unspecified asthma with (acute) exacerbation: Secondary | ICD-10-CM | POA: Diagnosis present

## 2013-07-23 HISTORY — DX: Acute respiratory failure with hypoxia: J96.01

## 2013-07-23 LAB — I-STAT CHEM 8, ED
BUN: 26 mg/dL — ABNORMAL HIGH (ref 6–23)
CREATININE: 0.9 mg/dL (ref 0.50–1.35)
Calcium, Ion: 1.11 mmol/L — ABNORMAL LOW (ref 1.12–1.23)
Chloride: 109 mEq/L (ref 96–112)
GLUCOSE: 97 mg/dL (ref 70–99)
HCT: 48 % (ref 39.0–52.0)
Hemoglobin: 16.3 g/dL (ref 13.0–17.0)
Potassium: 4.3 mEq/L (ref 3.7–5.3)
Sodium: 143 mEq/L (ref 137–147)
TCO2: 23 mmol/L (ref 0–100)

## 2013-07-23 LAB — CBC
HCT: 45.2 % (ref 39.0–52.0)
HEMOGLOBIN: 15.5 g/dL (ref 13.0–17.0)
MCH: 30.6 pg (ref 26.0–34.0)
MCHC: 34.3 g/dL (ref 30.0–36.0)
MCV: 89.2 fL (ref 78.0–100.0)
Platelets: 216 10*3/uL (ref 150–400)
RBC: 5.07 MIL/uL (ref 4.22–5.81)
RDW: 14.1 % (ref 11.5–15.5)
WBC: 8.2 10*3/uL (ref 4.0–10.5)

## 2013-07-23 LAB — CREATININE, SERUM: CREATININE: 0.92 mg/dL (ref 0.50–1.35)

## 2013-07-23 LAB — MRSA PCR SCREENING: MRSA BY PCR: NEGATIVE

## 2013-07-23 MED ORDER — ALBUTEROL (5 MG/ML) CONTINUOUS INHALATION SOLN
10.0000 mg/h | INHALATION_SOLUTION | Freq: Once | RESPIRATORY_TRACT | Status: AC
  Administered 2013-07-23: 10 mg/h via RESPIRATORY_TRACT

## 2013-07-23 MED ORDER — ALBUTEROL SULFATE (2.5 MG/3ML) 0.083% IN NEBU: 2.5000 mg | INHALATION_SOLUTION | RESPIRATORY_TRACT | Status: DC | PRN

## 2013-07-23 MED ORDER — ONDANSETRON HCL 4 MG PO TABS: 4.0000 mg | ORAL_TABLET | Freq: Four times a day (QID) | ORAL | Status: DC | PRN

## 2013-07-23 MED ORDER — IPRATROPIUM BROMIDE 0.02 % IN SOLN
RESPIRATORY_TRACT | Status: AC
  Administered 2013-07-23: 0.5 mg via RESPIRATORY_TRACT
  Filled 2013-07-23: qty 2.5

## 2013-07-23 MED ORDER — INFLUENZA VAC SPLIT QUAD 0.5 ML IM SUSP
0.5000 mL | INTRAMUSCULAR | Status: AC
Start: 2013-07-24 — End: 2013-07-24
  Administered 2013-07-24: 0.5 mL via INTRAMUSCULAR
  Filled 2013-07-23: qty 0.5

## 2013-07-23 MED ORDER — ALBUTEROL SULFATE (2.5 MG/3ML) 0.083% IN NEBU
2.5000 mg | INHALATION_SOLUTION | RESPIRATORY_TRACT | Status: DC
Start: 2013-07-23 — End: 2013-07-24
  Administered 2013-07-23 – 2013-07-24 (×4): 2.5 mg via RESPIRATORY_TRACT
  Filled 2013-07-23 (×5): qty 3

## 2013-07-23 MED ORDER — MOMETASONE FURO-FORMOTEROL FUM 200-5 MCG/ACT IN AERO
2.0000 | INHALATION_SPRAY | Freq: Two times a day (BID) | RESPIRATORY_TRACT | Status: DC
  Administered 2013-07-23 – 2013-07-27 (×8): 2 via RESPIRATORY_TRACT
  Filled 2013-07-23: qty 8.8

## 2013-07-23 MED ORDER — IPRATROPIUM-ALBUTEROL 0.5-2.5 (3) MG/3ML IN SOLN
3.0000 mL | Freq: Once | RESPIRATORY_TRACT | Status: AC
  Administered 2013-07-23: 3 mL via RESPIRATORY_TRACT
  Filled 2013-07-23: qty 3

## 2013-07-23 MED ORDER — MONTELUKAST SODIUM 10 MG PO TABS
10.0000 mg | ORAL_TABLET | Freq: Every day | ORAL | Status: DC
  Administered 2013-07-23 – 2013-07-26 (×4): 10 mg via ORAL
  Filled 2013-07-23 (×4): qty 1

## 2013-07-23 MED ORDER — LEVOFLOXACIN IN D5W 750 MG/150ML IV SOLN
750.0000 mg | INTRAVENOUS | Status: DC
  Administered 2013-07-23 – 2013-07-24 (×2): 750 mg via INTRAVENOUS
  Filled 2013-07-23 (×2): qty 150

## 2013-07-23 MED ORDER — IPRATROPIUM-ALBUTEROL 0.5-2.5 (3) MG/3ML IN SOLN
RESPIRATORY_TRACT | Status: AC
  Filled 2013-07-23: qty 3

## 2013-07-23 MED ORDER — ALBUTEROL SULFATE (2.5 MG/3ML) 0.083% IN NEBU
5.0000 mg | INHALATION_SOLUTION | Freq: Once | RESPIRATORY_TRACT | Status: AC
  Administered 2013-07-23: 5 mg via RESPIRATORY_TRACT
  Filled 2013-07-23: qty 6

## 2013-07-23 MED ORDER — SODIUM CHLORIDE 0.9 % IJ SOLN
3.0000 mL | Freq: Two times a day (BID) | INTRAMUSCULAR | Status: DC
  Administered 2013-07-23 – 2013-07-26 (×4): 3 mL via INTRAVENOUS

## 2013-07-23 MED ORDER — ENOXAPARIN SODIUM 40 MG/0.4ML ~~LOC~~ SOLN
40.0000 mg | SUBCUTANEOUS | Status: DC
  Administered 2013-07-23 – 2013-07-25 (×3): 40 mg via SUBCUTANEOUS
  Filled 2013-07-23 (×3): qty 0.4

## 2013-07-23 MED ORDER — ACETAMINOPHEN 650 MG RE SUPP: 650.0000 mg | Freq: Four times a day (QID) | RECTAL | Status: DC | PRN

## 2013-07-23 MED ORDER — BENZONATATE 100 MG PO CAPS
100.0000 mg | ORAL_CAPSULE | Freq: Four times a day (QID) | ORAL | Status: DC | PRN
  Administered 2013-07-23 – 2013-07-24 (×4): 100 mg via ORAL
  Filled 2013-07-23 (×4): qty 1

## 2013-07-23 MED ORDER — ONDANSETRON HCL 4 MG/2ML IJ SOLN: 4.0000 mg | Freq: Four times a day (QID) | INTRAMUSCULAR | Status: DC | PRN

## 2013-07-23 MED ORDER — PREDNISONE 50 MG PO TABS
60.0000 mg | ORAL_TABLET | Freq: Once | ORAL | Status: AC
  Administered 2013-07-23: 60 mg via ORAL
  Filled 2013-07-23 (×2): qty 1

## 2013-07-23 MED ORDER — ALBUTEROL (5 MG/ML) CONTINUOUS INHALATION SOLN
INHALATION_SOLUTION | RESPIRATORY_TRACT | Status: AC
  Administered 2013-07-23: 10 mg/h via RESPIRATORY_TRACT
  Filled 2013-07-23: qty 20

## 2013-07-23 MED ORDER — OXYCODONE-ACETAMINOPHEN 5-325 MG PO TABS
1.0000 | ORAL_TABLET | Freq: Three times a day (TID) | ORAL | Status: DC | PRN
  Administered 2013-07-23 – 2013-07-25 (×5): 1 via ORAL
  Filled 2013-07-23 (×6): qty 1

## 2013-07-23 MED ORDER — ACETAMINOPHEN 325 MG PO TABS
650.0000 mg | ORAL_TABLET | Freq: Four times a day (QID) | ORAL | Status: DC | PRN
  Administered 2013-07-23: 650 mg via ORAL
  Filled 2013-07-23: qty 2

## 2013-07-23 MED ORDER — IPRATROPIUM BROMIDE 0.02 % IN SOLN
0.5000 mg | Freq: Once | RESPIRATORY_TRACT | Status: AC
  Administered 2013-07-23: 0.5 mg via RESPIRATORY_TRACT

## 2013-07-23 MED ORDER — PNEUMOCOCCAL VAC POLYVALENT 25 MCG/0.5ML IJ INJ
0.5000 mL | INJECTION | INTRAMUSCULAR | Status: DC
  Filled 2013-07-23: qty 0.5

## 2013-07-23 MED ORDER — METHYLPREDNISOLONE SODIUM SUCC 125 MG IJ SOLR
80.0000 mg | Freq: Four times a day (QID) | INTRAMUSCULAR | Status: DC
  Administered 2013-07-23 – 2013-07-25 (×8): 80 mg via INTRAVENOUS
  Filled 2013-07-23 (×8): qty 2

## 2013-07-23 NOTE — ED Notes (Signed)
Paged RT.

## 2013-07-23 NOTE — ED Notes (Signed)
Pt c/o continued sob, productive cough-clear sputum and low oxygen level. 02 sats 88% on ra.

## 2013-07-23 NOTE — ED Provider Notes (Signed)
CSN: [id number]     Arrival date & time 07/23/13  [id number] History   This chart was scribed for Joya Gaskinsonald W Johncharles Fusselman, MD by Manuela Schwartzaylor Day, ED scribe. This patient was seen in room APA01/APA01 and the patient's care was started at 0814.  Chief Complaint  Patient presents with  . Asthma   The history is provided by the patient. No language interpreter was used.   HPI Comments: Drew Sandoval is a 38 y.o. male who presents to the Emergency Department with h/o asthma complaining of asthma exacerbation this AM and has had x3 ER visits for same within the past week; he was [facility] and is currently on steroids which he states does not seem to be helping. He reports associated coughing, wheezing, SOB and asthma/SOB worsens while walking around. He denies cp, abdominal pain or back pain. He has been in the ICU for asthma and was also [facility] in [facility]. He does not smoke. He has no other health problems.   [place] Dr. [name] in [place]  Past Medical History  Diagnosis Date  . Asthma   . Seasonal allergies    Past Surgical History  Procedure Laterality Date  . None     Family History  Problem Relation Age of Onset  . Emphysema Father    History  Substance Use Topics  . Smoking status: Former Smoker -- 0.50 packs/day for 11.5 years    Types: Cigarettes    Quit date: 04/09/2010  . Smokeless tobacco: Never Used     Comment: started at age 38.  1/2 ppd.    . Alcohol Use: No    Review of Systems  Constitutional: Negative for fever and chills.  Respiratory: Positive for cough, shortness of breath and wheezing.   Cardiovascular: Negative for chest pain.  Gastrointestinal: Negative for abdominal pain.  Musculoskeletal: Negative for back pain.  All other systems reviewed and are negative.    Allergies  Azithromycin; Alvesco; Anoro ellipta; Banana; and Penicillins  Home Medications   Current Outpatient Rx  Name  Route  Sig  Dispense  Refill  .  albuterol (PROVENTIL HFA;VENTOLIN HFA) 108 (90 BASE) MCG/ACT inhaler   Inhalation   Inhale 2 puffs into the lungs every 4 (four) hours as needed for wheezing or shortness of breath.   1 Inhaler   prn   . albuterol (PROVENTIL) (2.5 MG/3ML) 0.083% nebulizer solution   Nebulization   Take 2.5 mg by nebulization every 6 (six) hours as needed for wheezing or shortness of breath.         . EPINEPHrine (EPIPEN) 0.3 mg/0.3 mL SOAJ injection      Inject into thigh for severe allergic reaction   1 Device   prn   . ibuprofen (ADVIL,MOTRIN) 200 MG tablet   Oral   Take 200 mg by mouth every 6 (six) hours as needed. Pain         . mometasone-formoterol (DULERA) 200-5 MCG/ACT AERO   Inhalation   Inhale 2 puffs into the lungs 2 (two) times daily.   1 Inhaler   5   . montelukast (SINGULAIR) 10 MG tablet   Oral   Take 10 mg by mouth at bedtime.         . sulfamethoxazole-trimethoprim (SEPTRA DS) 800-160 MG per tablet   Oral   Take 1 tablet by mouth 2 (two) times daily. For 10 days   20 tablet   0    Triage Vitals: BP 131/85  Pulse 104  Temp(Src) 98.2 F (36.8 C)  Resp 22  Ht 6\' 3"  (1.905 m)  Wt 150 lb (68.04 kg)  BMI 18.75 kg/m2  SpO2 90%  Physical Exam CONSTITUTIONAL: Well developed/well nourished HEAD: Normocephalic/atraumatic EYES: EOMI/PERRL ENMT: Mucous membranes moist NECK: supple no meningeal signs SPINE:entire spine nontender CV: S1/S2 noted, no murmurs/rubs/gallops noted LUNGS: wheezing and tachypnea bilaterally ABDOMEN: soft, nontender, no rebound or guarding GU:no cva tenderness NEURO: Pt is awake/alert, moves all extremitiesx4 EXTREMITIES: pulses normal, full ROM SKIN: warm, color normal PSYCH: no abnormalities of mood noted ED Course  Procedures  CRITICAL CARE Performed by: Joya Gaskins Total critical care time: 33 Critical care time was exclusive of separately billable procedures and treating other patients. Critical care was necessary to  treat or prevent imminent or life-threatening deterioration. Critical care was time spent personally by me on the following activities: development of treatment plan with patient and/or surrogate as well as nursing, discussions with consultants, evaluation of patient's response to treatment, examination of patient, obtaining history from patient or surrogate, ordering and performing treatments and interventions, ordering and review of laboratory studies, ordering and review of radiographic studies, pulse oximetry and re-evaluation of patient's condition.  DIAGNOSTIC STUDIES: Oxygen Saturation is 92% on room air, adequate by my interpretation.    COORDINATION OF CARE: At 845 AM Discussed treatment plan with patient which includes proventil, deltasone, atrovent. Patient agrees.   10:12 AM Pt now with RA hypoxia despite nebs.  Will need continued treatment and will need admission due to failure of outpatient management 11:24 AM I checked on patient frequently and he is still hypoxic Pt with continued hypoxia, now on 5L [place] He is stable and protecting airway, but still with wheezing D/w dr York Spaniel, will place on stepdown  Labs Review Labs Reviewed  I-STAT CHEM 8, ED - Abnormal; Notable for the following:    BUN 26 (*)    Calcium, Ion 1.11 (*)    All other components within normal limits   Imaging Review Dg Chest Portable 1 View  07/23/2013   CLINICAL DATA:  Shortness of breath and cough  EXAM: PORTABLE CHEST - 1 VIEW  COMPARISON:  07/17/2013  FINDINGS: The heart size and mediastinal contours are within normal limits. Both lungs are clear. The visualized skeletal structures are unremarkable.  IMPRESSION: No active disease.   Electronically Signed   By: Alcide Clever M.D.   On: 07/23/2013 10:27     MDM   Final diagnoses:  Status asthmaticus    Nursing notes including past medical history and social history reviewed and considered in documentation xrays reviewed and considered Labs/vital  reviewed and considered Previous records reviewed and considered  I personally performed the services described in this documentation, which was scribed in my presence. The recorded information has been reviewed and is accurate.      Joya Gaskins, MD 07/23/13 1125

## 2013-07-23 NOTE — H&P (Signed)
[facility] History and Physical  Carlus PavlovMarshall Hochstatter JYN:[id number] DOB: 1975-11-22 DOA: 07/23/2013  Referring physician:  PCP: [name],[name], MD  Specialists:   Chief Complaint: SOB  HPI: Carlus PavlovMarshall Vineyard is a 38 y.o. male with PMH of asthma presented with SOB, cough, chest tightness found to have hypoxia with asthma; he was recently seen in ED for asthma exacerbation, prescribed steroids, inhalers but still having intermittent persistent symptoms; he was [facility] and is currently on steroids which he states does not seem to be helping. He reports associated coughing, wheezing, SOB and asthma/SOB worsens while walking around -denies chest pain, no fever, no nausea, vomiting, diarrhea   Review of Systems: The patient denies anorexia, fever, weight loss,, vision loss, decreased hearing, hoarseness, chest pain, syncope,peripheral edema, balance deficits, hemoptysis, abdominal pain, melena, hematochezia, severe indigestion/heartburn, hematuria, incontinence, genital sores, muscle weakness, suspicious skin lesions, transient blindness, difficulty walking, depression, unusual weight change, abnormal bleeding, enlarged lymph nodes, angioedema, and breast masses.    Past Medical History  Diagnosis Date  . Asthma   . Seasonal allergies    Past Surgical History  Procedure Laterality Date  . None     Social History:  reports that he quit smoking about 3 years ago. His smoking use included Cigarettes. He has a 5.75 pack-year smoking history. He has never used smokeless tobacco. He reports that he does not drink alcohol or use illicit drugs. Home;  where does patient live--home, ALF, SNF? and with whom if at home? Yes;  Can patient participate in ADLs?  Allergies  Allergen Reactions  . Azithromycin Rash  . Alvesco [Ciclesonide]     Possible reaction- rash  . Anoro Ellipta [Umeclidinium-Vilanterol]     Possible- rash  . Banana Swelling    Throat swelling   .  Penicillins Rash    Family History  Problem Relation Age of Onset  . Emphysema Father     (be sure to complete)  Prior to Admission medications   Medication Sig Start Date End Date Taking? Authorizing Provider  albuterol (PROVENTIL HFA;VENTOLIN HFA) 108 (90 BASE) MCG/ACT inhaler Inhale 2 puffs into the lungs every 4 (four) hours as needed for wheezing or shortness of breath. 04/11/13 04/11/14 Yes Clinton D Young, MD  albuterol (PROVENTIL) (2.5 MG/3ML) 0.083% nebulizer solution Take 2.5 mg by nebulization every 6 (six) hours as needed for wheezing or shortness of breath.   Yes Historical Provider, MD  EPINEPHrine (EPIPEN) 0.3 mg/0.3 mL SOAJ injection Inject into thigh for severe allergic reaction 03/05/13  Yes Waymon Budgelinton D Young, MD  mometasone-formoterol (DULERA) 200-5 MCG/ACT AERO Inhale 2 puffs into the lungs 2 (two) times daily. 03/28/13  Yes Waymon Budgelinton D Young, MD  montelukast (SINGULAIR) 10 MG tablet Take 10 mg by mouth at bedtime.   Yes Historical Provider, MD  sulfamethoxazole-trimethoprim (SEPTRA DS) 800-160 MG per tablet Take 1 tablet by mouth 2 (two) times daily. For 10 days 07/17/13  Yes Tammy L. Triplett, PA-C   Physical Exam: Filed Vitals:   07/23/13 1106  BP: 130/72  Pulse: 102  Temp:   Resp: 21     General:  alert  Eyes: eom-i, perrla   ENT: no oral ulcers   Neck: supple   Cardiovascular: s1,s2 rrr  Respiratory: poor ventilation BL  Abdomen: soft, nt,nd   Skin: no rash  Musculoskeletal: no LE edema  Psychiatric: no hallucinations   Neurologic: CN 2-12 intact  Labs on Admission:  Basic Metabolic Panel:  Recent Labs Lab 07/23/13 1010  NA 143  K 4.3  CL 109  GLUCOSE 97  BUN 26*  CREATININE 0.90   Liver Function Tests: No results found for this basename: AST, ALT, ALKPHOS, BILITOT, PROT, ALBUMIN,  in the last 168 hours No results found for this basename: LIPASE, AMYLASE,  in the last 168 hours No results found for this basename: AMMONIA,  in the last  168 hours CBC:  Recent Labs Lab 07/23/13 1010  HGB 16.3  HCT 48.0   Cardiac Enzymes: No results found for this basename: CKTOTAL, CKMB, CKMBINDEX, TROPONINI,  in the last 168 hours  BNP (last 3 results) No results found for this basename: PROBNP,  in the last 8760 hours CBG: No results found for this basename: GLUCAP,  in the last 168 hours  Radiological Exams on Admission: Dg Chest Portable 1 View  07/23/2013   CLINICAL DATA:  Shortness of breath and cough  EXAM: PORTABLE CHEST - 1 VIEW  COMPARISON:  07/17/2013  FINDINGS: The heart size and mediastinal contours are within normal limits. Both lungs are clear. The visualized skeletal structures are unremarkable.  IMPRESSION: No active disease.   Electronically Signed   By: Alcide Clever M.D.   On: 07/23/2013 10:27    EKG: Independently reviewed. Not don e  Assessment/Plan Principal Problem:   Status asthmaticus Active Problems:   Asthma, severe persistent   Asthma exacerbation   Acute respiratory failure with hypoxia   38 y.o. male with PMH of asthma presented with SOB, cough, chest tightness found to have hypoxia with asthma;   1. Acute asthma exacerbation; persistent severe asthma at baseline;   -started IV steroids, continue bronchodilators, IV atx, oxygen; NiPPV prn;   2. Acute respiratory failure with hypoxia due to asthma; as above; monitor SDU   None;  if consultant consulted, please document name and whether formally or informally consulted  Code Status: full (must indicate code status--if unknown or must be presumed, indicate so) Family Communication: d/w patient (indicate person spoken with, if applicable, with phone number if by telephone) Disposition Plan: home 2-3 days (indicate anticipated LOS)  Time spent: >35 minutes   Esperanza Sheets [facility] Pager [phone]  If 7PM-7AM, please contact night-coverage [email or web address] Password [facility] 07/23/2013, 11:37 AM

## 2013-07-24 MED ORDER — LEVALBUTEROL HCL 0.63 MG/3ML IN NEBU
0.6300 mg | INHALATION_SOLUTION | RESPIRATORY_TRACT | Status: DC
  Administered 2013-07-24 – 2013-07-25 (×6): 0.63 mg via RESPIRATORY_TRACT
  Filled 2013-07-24 (×6): qty 3

## 2013-07-24 MED ORDER — GUAIFENESIN ER 600 MG PO TB12
600.0000 mg | ORAL_TABLET | Freq: Two times a day (BID) | ORAL | Status: DC
  Administered 2013-07-24 (×2): 600 mg via ORAL
  Filled 2013-07-24 (×2): qty 1

## 2013-07-24 MED ORDER — LEVALBUTEROL HCL 0.63 MG/3ML IN NEBU: 0.6300 mg | INHALATION_SOLUTION | Freq: Four times a day (QID) | RESPIRATORY_TRACT | Status: DC

## 2013-07-24 MED ORDER — LEVALBUTEROL HCL 0.63 MG/3ML IN NEBU
INHALATION_SOLUTION | RESPIRATORY_TRACT | Status: AC
  Administered 2013-07-24: 0.63 mg
  Filled 2013-07-24: qty 3

## 2013-07-24 NOTE — Care Management Note (Signed)
    Page 1 of 1   07/24/2013     3:11:24 PM   CARE MANAGEMENT NOTE 07/24/2013  Patient:  Carlus PavlovRUCKER,Mister   Account Number:  [id number]  Date Initiated:  07/24/2013  Documentation initiated by:  Sharrie RothmanBLACKWELL,Aldean Suddeth C  Subjective/Objective Assessment:   Pt admitted from home with asthma exacerbation. Pt lives alone and will return home at discharge. Pt is independent with ADL's. pt has a neb machine for home use.     Action/Plan:   Pt would like a new PCP and this was established with Dr. Karilyn CotaGosrani. Appt made and documented on AVS. No other CM needs noted.   Anticipated DC Date:  07/26/2013   Anticipated DC Plan:  HOME/SELF CARE      DC Planning Services  CM consult      Choice offered to / List presented to:             Status of service:  Completed, signed off Medicare Important Message given?   (If response is "NO", the following Medicare IM given date fields will be blank) Date Medicare IM given:   Date Additional Medicare IM given:    Discharge Disposition:  HOME/SELF CARE  Per UR Regulation:    If discussed at Long Length of Stay Meetings, dates discussed:    Comments:  07/24/13 1510 Arlyss Queenammy Breeze Angell, RN BSN CM

## 2013-07-24 NOTE — Progress Notes (Signed)
[facility] PROGRESS NOTE  Drew Sandoval ZOX:[id number] DOB: 04/30/1976 DOA: 07/23/2013 PCP: Drew Sandoval,Drew J, MD   Brief narrative 38 y.o. male with PMH of asthma presented with SOB, cough, chest tightness found to have hypoxia with asthma; he was recently seen in ED for asthma exacerbation, prescribed steroids, inhalers but still having intermittent persistent symptoms; he was [facility] 1 day prior to admission on steroids which did not help his symptoms. He reports associated coughing, wheezing, SOB and asthma/SOB worsens while walking around.  Assessment/Plan: Acute hypoxic respiratory failure secondary to asthma exacerbation Patient has persistent severe asthma. Continue IV Solu Medrol 80 mg every 6 hours. Oxygen via nasal cannula. He should tachycardic on albuterol nebs, switched to Xopenex nebs q4 hours. Continue dulera, Singulair and when necessary albuterol nebs. -Continue empiric Levaquin. Added mucinex. -patient follows with Dr [name] and reports frequent asthma attacks.     Code Status: full code Family Communication: none at bedside Disposition Plan: home once improved   Consultants:  None  Procedures:  none  Antibiotics:  IV Levaquin  HPI/Subjective: Patient seen and examined this morning. Reports mild improvement in his symptoms but still being wheezy and persistent cough.  Objective: Filed Vitals:   07/24/13 0800  BP: 125/89  Pulse: 89  Temp: 97.8 F (36.6 C)  Resp: 23    Intake/Output Summary (Last 24 hours) at 07/24/13 1131 Last data filed at 07/24/13 0500  Gross per 24 hour  Intake      0 ml  Output    600 ml  Net   -600 ml   Filed Weights   07/23/13 0821 07/23/13 1238 07/24/13 0500  Weight: 68.04 kg (150 lb) 68.04 kg (150 lb) 63.4 kg (139 lb 12.4 oz)    Exam:   General:  Revealed aged thin built male lying in bed ,  using accessory muscles of respiration  HEENT: No pallor, moist oral mucosa  Chest: scattered  wheeze bilateral, fine crackles over left lung base  Cardiovascular: S1&S2 tachycardic, no murmurs, rubs or gallop  Abdomen: Soft, nontender, nondistended, bowel sounds present  Musculoskeletal: Long: No edema  CNS: AAO x3  Data Reviewed: Basic Metabolic Panel:  Recent Labs Lab 07/23/13 1000 07/23/13 1010  NA  --  143  K  --  4.3  CL  --  109  GLUCOSE  --  97  BUN  --  26*  CREATININE 0.92 0.90   Liver Function Tests: No results found for this basename: AST, ALT, ALKPHOS, BILITOT, PROT, ALBUMIN,  in the last 168 hours No results found for this basename: LIPASE, AMYLASE,  in the last 168 hours No results found for this basename: AMMONIA,  in the last 168 hours CBC:  Recent Labs Lab 07/23/13 1000 07/23/13 1010  WBC 8.2  --   HGB 15.5 16.3  HCT 45.2 48.0  MCV 89.2  --   PLT 216  --    Cardiac Enzymes: No results found for this basename: CKTOTAL, CKMB, CKMBINDEX, TROPONINI,  in the last 168 hours BNP (last 3 results) No results found for this basename: PROBNP,  in the last 8760 hours CBG: No results found for this basename: GLUCAP,  in the last 168 hours  Recent Results (from the past 240 hour(s))  MRSA PCR SCREENING     Status: None   Collection Time    07/23/13 12:35 PM      Result Value Ref Range Status   MRSA by PCR NEGATIVE  NEGATIVE Final  Comment:            The GeneXpert MRSA Assay (FDA     approved for NASAL specimens     only), is one component of a     comprehensive MRSA colonization     surveillance program. It is not     intended to diagnose MRSA     infection nor to guide or     monitor treatment for     MRSA infections.     Studies: Dg Chest Portable 1 View  07/23/2013   CLINICAL DATA:  Shortness of breath and cough  EXAM: PORTABLE CHEST - 1 VIEW  COMPARISON:  07/17/2013  FINDINGS: The heart size and mediastinal contours are within normal limits. Both lungs are clear. The visualized skeletal structures are unremarkable.  IMPRESSION: No  active disease.   Electronically Signed   By: Alcide Clever M.D.   On: 07/23/2013 10:27    Scheduled Meds: . enoxaparin (LOVENOX) injection  40 mg Subcutaneous Q24H  . guaiFENesin  600 mg Oral BID  . levalbuterol  0.63 mg Nebulization Q4H  . levofloxacin (LEVAQUIN) IV  750 mg Intravenous Q24H  . methylPREDNISolone (SOLU-MEDROL) injection  80 mg Intravenous Q6H  . mometasone-formoterol  2 puff Inhalation BID  . montelukast  10 mg Oral QHS  . pneumococcal 23 valent vaccine  0.5 mL Intramuscular Tomorrow-1000  . sodium chloride  3 mL Intravenous Q12H   Continuous Infusions:    Time spent: 35 minutes    Janavia Rottman  [facility] Pager [phone] If 7PM-7AM, please contact night-coverage at [email or web address], password [facility] 07/24/2013, 11:31 AM  LOS: 1 day

## 2013-07-24 NOTE — Progress Notes (Signed)
UR chart review completed.  

## 2013-07-25 MED ORDER — METOPROLOL TARTRATE 1 MG/ML IV SOLN
2.5000 mg | Freq: Once | INTRAVENOUS | Status: DC
  Filled 2013-07-25: qty 5

## 2013-07-25 MED ORDER — ALBUTEROL SULFATE (2.5 MG/3ML) 0.083% IN NEBU
5.0000 mg | INHALATION_SOLUTION | Freq: Four times a day (QID) | RESPIRATORY_TRACT | Status: DC
  Administered 2013-07-26 – 2013-07-27 (×5): 5 mg via RESPIRATORY_TRACT
  Filled 2013-07-25 (×5): qty 6

## 2013-07-25 MED ORDER — ALBUTEROL SULFATE (2.5 MG/3ML) 0.083% IN NEBU
5.0000 mg | INHALATION_SOLUTION | RESPIRATORY_TRACT | Status: DC
  Administered 2013-07-25 (×3): 5 mg via RESPIRATORY_TRACT
  Filled 2013-07-25 (×4): qty 6

## 2013-07-25 NOTE — Progress Notes (Signed)
[facility] PROGRESS NOTE  Keeon Zurn ZOX:[id number] DOB: 20-Apr-1976 DOA: 07/23/2013 PCP: [name], MD   Brief narrative 38 y.o. male with PMH of asthma with significant environmental allergies, being treated with vaccines, admitted 3/60/14 with SOB, cough, chest tightness found to have hypoxia with asthma; he was recently seen in ED for asthma exacerbation, prescribed steroids, inhalers but still having intermittent persistent symptoms; he was discharge  from [facility] ER 1 day prior to admission on steroids which did not help his symptoms. He reports associated coughing, wheezing, SOB and asthma/SOB worsens while walking around.  Assessment/Plan: Acute hypoxic respiratory failure secondary to asthma exacerbation Patient has persistent severe asthma.  Initially placed on IV Solu Medrol 80 mg every 6 hours--. Tapered on 3/18 to prednisone 60 daily Initially was on Oxygen via nasal cannula-currently does not have an oxygen requirement. Initiallytachycardic on albuterol nebs-initially was switched to Xopenex but did not like the same and therefore switch back to albuterol nebs  Continue dulera, Singulair and when necessary albuterol nebs. -Discontinued  empiric Levaquin 3/80 . Added mucinex. -patient follows with Dr Maple Hudson and reports frequent asthma attacks. -Stable transfer to the step down this morning -He will need further vaccines/immunotherapy to prevent this from worsening   Code Status: full code Family Communication: none at bedside Disposition Plan:  Transfer to MedSurg, home in one to 2 days with close followup with pulmonology     Consultants:  None  Procedures:  none  Antibiotics:  IV Levaquin  HPI/Subjective: Doing fair, and feels that 60% of his normal self Tolerating diet Thinks he is almost December 21 came in Has not ambulated without oxygen however Patient off oxygen mask this morning on exam and was 92% Chest pain or shortness No  sputum   Objective: Filed Vitals:   07/25/13 0800  BP: 109/65  Pulse: 79  Temp:   Resp: 18    Intake/Output Summary (Last 24 hours) at 07/25/13 1121 Last data filed at 07/25/13 0700  Gross per 24 hour  Intake    240 ml  Output   1100 ml  Net   -860 ml   Filed Weights   07/23/13 1238 07/24/13 0500 07/25/13 0500  Weight: 68.04 kg (150 lb) 63.4 kg (139 lb 12.4 oz) 62.9 kg (138 lb 10.7 oz)    Exam:   General:  Pleasant oriented NAD using accessory muscles of respiration  HEENT: No pallor, moist oral mucosa  Chest: scattered wheeze bilateral, no rales or rhonchi no fremitus  Cardiovascular: S1&S2 tachycardic, no murmurs, rubs or gallop  Abdomen: Soft, nontender, nondistended, bowel sounds present  Musculoskeletal: Long: No edema  CNS: AAO x3  Data Reviewed: Basic Metabolic Panel:  Recent Labs Lab 07/23/13 1000 07/23/13 1010  NA  --  143  K  --  4.3  CL  --  109  GLUCOSE  --  97  BUN  --  26*  CREATININE 0.92 0.90   Liver Function Tests: No results found for this basename: AST, ALT, ALKPHOS, BILITOT, PROT, ALBUMIN,  in the last 168 hours No results found for this basename: LIPASE, AMYLASE,  in the last 168 hours No results found for this basename: AMMONIA,  in the last 168 hours CBC:  Recent Labs Lab 07/23/13 1000 07/23/13 1010  WBC 8.2  --   HGB 15.5 16.3  HCT 45.2 48.0  MCV 89.2  --   PLT 216  --    Cardiac Enzymes: No results found for this basename: CKTOTAL, CKMB, CKMBINDEX, TROPONINI,  in the last 168 hours BNP (last 3 results) No results found for this basename: PROBNP,  in the last 8760 hours CBG: No results found for this basename: GLUCAP,  in the last 168 hours  Recent Results (from the past 240 hour(s))  MRSA PCR SCREENING     Status: None   Collection Time    07/23/13 12:35 PM      Result Value Ref Range Status   MRSA by PCR NEGATIVE  NEGATIVE Final   Comment:            The GeneXpert MRSA Assay (FDA     approved for NASAL  specimens     only), is one component of a     comprehensive MRSA colonization     surveillance program. It is not     intended to diagnose MRSA     infection nor to guide or     monitor treatment for     MRSA infections.     Studies: No results found.  Scheduled Meds: . albuterol  5 mg Nebulization Q4H  . enoxaparin (LOVENOX) injection  40 mg Subcutaneous Q24H  . mometasone-formoterol  2 puff Inhalation BID  . montelukast  10 mg Oral QHS  . pneumococcal 23 valent vaccine  0.5 mL Intramuscular Tomorrow-1000  . sodium chloride  3 mL Intravenous Q12H   Continuous Infusions:    Time spent: 25 minutes   Pleas KochJai Jady Braggs, MD [facility] [phone]

## 2013-07-25 NOTE — Progress Notes (Signed)
Pt ambulated around dept approx 200 ft. Patient had no complaints of SOB, patient's O2 sat remained above 95% during ambulation. Patient ambulated on room air.

## 2013-07-26 LAB — COMPREHENSIVE METABOLIC PANEL WITH GFR
ALT: 14 U/L (ref 0–53)
AST: 18 U/L (ref 0–37)
Albumin: 3.4 g/dL — ABNORMAL LOW (ref 3.5–5.2)
Alkaline Phosphatase: 93 U/L (ref 39–117)
BUN: 28 mg/dL — ABNORMAL HIGH (ref 6–23)
CO2: 30 meq/L (ref 19–32)
Calcium: 9 mg/dL (ref 8.4–10.5)
Chloride: 105 meq/L (ref 96–112)
Creatinine, Ser: 0.68 mg/dL (ref 0.50–1.35)
GFR calc Af Amer: >90 mL/min
GFR calc non Af Amer: >90 mL/min
Glucose, Bld: 95 mg/dL (ref 70–99)
Potassium: 4.2 meq/L (ref 3.7–5.3)
Sodium: 144 meq/L (ref 137–147)
Total Bilirubin: 0.2 mg/dL — ABNORMAL LOW (ref 0.3–1.2)
Total Protein: 7.4 g/dL (ref 6.0–8.3)

## 2013-07-26 LAB — CBC
HCT: 45.2 % (ref 39.0–52.0)
Hemoglobin: 14.9 g/dL (ref 13.0–17.0)
MCH: 30 pg (ref 26.0–34.0)
MCHC: 33 g/dL (ref 30.0–36.0)
MCV: 91.1 fL (ref 78.0–100.0)
Platelets: 224 10*3/uL (ref 150–400)
RBC: 4.96 MIL/uL (ref 4.22–5.81)
RDW: 14.1 % (ref 11.5–15.5)
WBC: 8.5 10*3/uL (ref 4.0–10.5)

## 2013-07-26 MED ORDER — PREDNISONE 20 MG PO TABS
60.0000 mg | ORAL_TABLET | Freq: Every day | ORAL | Status: DC
  Administered 2013-07-26 – 2013-07-27 (×2): 60 mg via ORAL
  Filled 2013-07-26 (×2): qty 3

## 2013-07-26 NOTE — Progress Notes (Signed)
[facility] PROGRESS NOTE  Drew Sandoval ZOX:[id number] DOB: [birth year] DOA: 07/23/2013 PCP: [name], MD   Brief narrative 38 y.o. male with PMH of asthma with significant environmental allergies, being treated with vaccines, admitted 3/60/14 with SOB, cough, chest tightness found to have hypoxia with asthma; he was recently seen in ED for asthma exacerbation, prescribed steroids, inhalers but still having intermittent persistent symptoms; he was discharge  from [facility] ER 1 day prior to admission on steroids which did not help his symptoms. He reports associated coughing, wheezing, SOB and asthma/SOB worsens while walking around.  Assessment/Plan:  Acute hypoxic respiratory failure secondary to asthma exacerbation Patient has persistent severe asthma.  Initially placed on IV Solu Medrol 80 mg every 6 hours--Tapered on 3/18 to prednisone 60 daily Initially was on Oxygen via nasal cannula-currently does not have an oxygen requirement. Initially tachycardic on albuterol nebs-initially was switched to Xopenex but did not like the same and therefore switch back to albuterol nebs  -Continue dulera, Singulair and when necessary albuterol nebs. -Discontinued  empiric Levaquin 3/18 . Added mucinex. -patient follows with Dr Maple Hudson and reports frequent asthma attacks. -Transferred to telemetry -He will need further vaccines/immunotherapy to prevent this from worsening   Code Status: full code Family Communication: none at bedside Disposition Plan:  Transfer to MedSurg, home in one to 2 days with close followup with pulmonology     Consultants:  None  Procedures:  none  Antibiotics:  IV Levaquin  HPI/Subjective: Doing fair, doesn't feel close to normal Look well, no wheeze no n/v/d   Objective: Filed Vitals:   07/26/13 1455  BP: 127/90  Pulse: 104  Temp: 97.7 F (36.5 C)  Resp: 22    Intake/Output Summary (Last 24 hours) at 07/26/13 1517 Last data filed at  07/26/13 1300  Gross per 24 hour  Intake    360 ml  Output    250 ml  Net    110 ml   Filed Weights   07/24/13 0500 07/25/13 0500 07/26/13 0547  Weight: 63.4 kg (139 lb 12.4 oz) 62.9 kg (138 lb 10.7 oz) 63.4 kg (139 lb 12.4 oz)    Exam:   General:  Pleasant oriented NAD using accessory muscles of respiration  HEENT: No pallor, moist oral mucosa  Chest: scattered wheeze bilateral, no rales or rhonchi no fremitus  Cardiovascular: S1&S2 tachycardic, no murmurs, rubs or gallop  Abdomen: Soft, nontender, nondistended, bowel sounds present  Musculoskeletal: Long: No edema  CNS: AAO x3  Data Reviewed: Basic Metabolic Panel:  Recent Labs Lab 07/23/13 1000 07/23/13 1010 07/26/13 0522  NA  --  143 144  K  --  4.3 4.2  CL  --  109 105  CO2  --   --  30  GLUCOSE  --  97 95  BUN  --  26* 28*  CREATININE 0.92 0.90 0.68  CALCIUM  --   --  9.0   Liver Function Tests:  Recent Labs Lab 07/26/13 0522  AST 18  ALT 14  ALKPHOS 93  BILITOT 0.2*  PROT 7.4  ALBUMIN 3.4*   No results found for this basename: LIPASE, AMYLASE,  in the last 168 hours No results found for this basename: AMMONIA,  in the last 168 hours CBC:  Recent Labs Lab 07/23/13 1000 07/23/13 1010 07/26/13 0522  WBC 8.2  --  8.5  HGB 15.5 16.3 14.9  HCT 45.2 48.0 45.2  MCV 89.2  --  91.1  PLT 216  --  224  Cardiac Enzymes: No results found for this basename: CKTOTAL, CKMB, CKMBINDEX, TROPONINI,  in the last 168 hours BNP (last 3 results) No results found for this basename: PROBNP,  in the last 8760 hours CBG: No results found for this basename: GLUCAP,  in the last 168 hours  Recent Results (from the past 240 hour(s))  MRSA PCR SCREENING     Status: None   Collection Time    07/23/13 12:35 PM      Result Value Ref Range Status   MRSA by PCR NEGATIVE  NEGATIVE Final   Comment:            The GeneXpert MRSA Assay (FDA     approved for NASAL specimens     only), is one component of a      comprehensive MRSA colonization     surveillance program. It is not     intended to diagnose MRSA     infection nor to guide or     monitor treatment for     MRSA infections.     Studies: No results found.  Scheduled Meds: . albuterol  5 mg Nebulization Q6H  . enoxaparin (LOVENOX) injection  40 mg Subcutaneous Q24H  . metoprolol  2.5 mg Intravenous Once  . mometasone-formoterol  2 puff Inhalation BID  . montelukast  10 mg Oral QHS  . pneumococcal 23 valent vaccine  0.5 mL Intramuscular Tomorrow-1000  . predniSONE  60 mg Oral QAC breakfast  . sodium chloride  3 mL Intravenous Q12H   Continuous Infusions:    Time spent: 15 minutes   Pleas KochJai Caylie Sandquist, MD [facility] [phone]([phone]

## 2013-07-27 ENCOUNTER — Encounter: Payer: Self-pay | Admitting: Family Medicine

## 2013-07-27 MED ORDER — PREDNISONE 20 MG PO TABS: 60.0000 mg | ORAL_TABLET | Freq: Every day | ORAL | Status: DC

## 2013-07-27 NOTE — Discharge Summary (Signed)
Physician Discharge Summary  Drew PavlovMarshall Sandoval NFA:[id number] DOB: [birth year] DOA: 07/23/2013  PCP: [name],[name], MD  Admit date: 07/23/2013 Discharge date: 07/27/2013  Time spent: 35 minutes  Recommendations for Outpatient Follow-up:  1. Complete course of steroids 07/29/13 2. Followup closely with lung doctor Dr. [name] for further management of allergy disease   Discharge Diagnoses:  Principal Problem:   Status asthmaticus Active Problems:   Asthma, severe persistent   Asthma exacerbation   Acute respiratory failure with hypoxia   Acute respiratory failure   Discharge Condition: Stable  Diet recommendation: Regular  Filed Weights   07/24/13 0500 07/25/13 0500 07/26/13 0547  Weight: 63.4 kg (139 lb 12.4 oz) 62.9 kg (138 lb 10.7 oz) 63.4 kg (139 lb 12.4 oz)    History of present illness:  38 y.o. male with PMH of asthma with significant environmental allergies, being treated with immunotherapy/ vaccines, admitted 07/23/12 with SOB, cough, chest tightness found to have hypoxia with asthma; he was recently seen in ED for asthma exacerbation, prescribed steroids, inhalers but still having intermittent persistent symptoms; he was [facility] 1 day prior to admission on steroids which did not help his symptoms.    Hospital Course:  Acute hypoxic respiratory failure secondary to asthma exacerbation  Patient has persistent severe asthma.  Initially placed on IV Solu Medrol 80 mg every 6 hours--Tapered on 3/18 to prednisone 60 daily-stop date = 3/22 Initially was on Oxygen via nasal cannula-currently does not have an oxygen requirement.  Initially tachycardic on albuterol nebs-initially was switched to Xopenex but did not like the same and therefore switch back to albuterol nebs  -Continue dulera, Singulair and when necessary albuterol nebs-these will be continued at home -Discontinued empiric Levaquin 3/18 as he did productive sputum . Added mucinex.  -patient follows  with Dr [name] and reports frequent asthma attacks-will need close follow up  -He will need further vaccines/immunotherapy to prevent this from worsening  Discharge Exam: Filed Vitals:   07/27/13 0533  BP: 133/80  Pulse: 77  Temp: 98 F (36.7 C)  Resp: 20   patient looks well. He feels ready to go home. He is ambulated well  General:  EOMI, NCAT Cardiovascular:  S1-S2 no murmur rub or gallop  Respiratory:  mild wheeze but significantly improved from previous day   Discharge Instructions   Future Appointments Provider Department Dept Phone   11/19/2013 9:45 AM Waymon Budgelinton D Young, MD [facility] [phone]       Medication List    STOP taking these medications       sulfamethoxazole-trimethoprim 800-160 MG per tablet  Commonly known as:  SEPTRA DS      TAKE these medications       albuterol 108 (90 BASE) MCG/ACT inhaler  Commonly known as:  PROVENTIL HFA;VENTOLIN HFA  Inhale 2 puffs into the lungs every 4 (four) hours as needed for wheezing or shortness of breath.     EPINEPHrine 0.3 mg/0.3 mL Soaj injection  Commonly known as:  EPIPEN  Inject into thigh for severe allergic reaction     mometasone-formoterol 200-5 MCG/ACT Aero  Commonly known as:  DULERA  Inhale 2 puffs into the lungs 2 (two) times daily.     montelukast 10 MG tablet  Commonly known as:  SINGULAIR  Take 10 mg by mouth at bedtime.     predniSONE 20 MG tablet  Commonly known as:  DELTASONE  Take 3 tablets (60 mg total) by mouth daily before breakfast.  Allergies  Allergen Reactions  . Azithromycin Rash  . Alvesco [Ciclesonide]     Possible reaction- rash  . Anoro Ellipta [Umeclidinium-Vilanterol]     Possible- rash  . Banana Swelling    Throat swelling   . Penicillins Rash       Follow-up Information   Follow up with Wilson Singer, MD.   Specialty:  Internal Medicine   Contact information:   [address] [place] [address] [phone]        The  results of significant diagnostics from this hospitalization (including imaging, microbiology, ancillary and laboratory) are listed below for reference.    Significant Diagnostic Studies: Dg Chest 2 View  07/17/2013   CLINICAL DATA:  Asthma and chest tightness  EXAM: CHEST  2 VIEW  COMPARISON:  June 27, 2013  FINDINGS: Lungs are mildly hyperexpanded, a finding that may be seen with asthma. There is no edema or consolidation. The heart size and pulmonary vascularity are normal. There is some slight central interstitial and peribronchial thickening which may be seen with chronic asthma. No adenopathy. No bone lesions.  IMPRESSION: Central interstitial and peribronchial thickening as well as mild a per expansion, all findings at may be seen with asthma. Superimposed acute bronchitis may well be present as well. No consolidation.   Electronically Signed   By: Bretta Bang M.D.   On: 07/17/2013 09:39   Dg Chest Portable 1 View  07/23/2013   CLINICAL DATA:  Shortness of breath and cough  EXAM: PORTABLE CHEST - 1 VIEW  COMPARISON:  07/17/2013  FINDINGS: The heart size and mediastinal contours are within normal limits. Both lungs are clear. The visualized skeletal structures are unremarkable.  IMPRESSION: No active disease.   Electronically Signed   By: Alcide Clever M.D.   On: 07/23/2013 10:27   Dg Chest Portable 1 View  06/27/2013   CLINICAL DATA:  Shortness of Breath ; history of asthma  EXAM: PORTABLE CHEST - 1 VIEW  COMPARISON:  May 23, 2013  FINDINGS: The lungs are hyperexpanded but clear. Heart size and pulmonary vascularity are normal. No adenopathy. No pneumothorax. No bone lesions.  IMPRESSION: Lungs hyperexpanded; this finding may be consistent with history of chronic asthma. No edema or consolidation.   Electronically Signed   By: Bretta Bang M.D.   On: 06/27/2013 12:07    Microbiology: Recent Results (from the past 240 hour(s))  MRSA PCR SCREENING     Status: None   Collection  Time    07/23/13 12:35 PM      Result Value Ref Range Status   MRSA by PCR NEGATIVE  NEGATIVE Final   Comment:            The GeneXpert MRSA Assay (FDA     approved for NASAL specimens     only), is one component of a     comprehensive MRSA colonization     surveillance program. It is not     intended to diagnose MRSA     infection nor to guide or     monitor treatment for     MRSA infections.     Labs: Basic Metabolic Panel:  Recent Labs Lab 07/23/13 1000 07/23/13 1010 07/26/13 0522  NA  --  143 144  K  --  4.3 4.2  CL  --  109 105  CO2  --   --  30  GLUCOSE  --  97 95  BUN  --  26* 28*  CREATININE 0.92  0.90 0.68  CALCIUM  --   --  9.0   Liver Function Tests:  Recent Labs Lab 07/26/13 0522  AST 18  ALT 14  ALKPHOS 93  BILITOT 0.2*  PROT 7.4  ALBUMIN 3.4*   No results found for this basename: LIPASE, AMYLASE,  in the last 168 hours No results found for this basename: AMMONIA,  in the last 168 hours CBC:  Recent Labs Lab 07/23/13 1000 07/23/13 1010 07/26/13 0522  WBC 8.2  --  8.5  HGB 15.5 16.3 14.9  HCT 45.2 48.0 45.2  MCV 89.2  --  91.1  PLT 216  --  224   Cardiac Enzymes: No results found for this basename: CKTOTAL, CKMB, CKMBINDEX, TROPONINI,  in the last 168 hours BNP: BNP (last 3 results) No results found for this basename: PROBNP,  in the last 8760 hours CBG: No results found for this basename: GLUCAP,  in the last 168 hours     Signed:  Rhetta Mura  [facility] 07/27/2013, 10:15 AM

## 2013-07-27 NOTE — Progress Notes (Signed)
Discharge instructions and work note given, verbalized understanding, out ambulatory in stable condition with staff.

## 2013-08-03 ENCOUNTER — Ambulatory Visit (INDEPENDENT_AMBULATORY_CARE_PROVIDER_SITE_OTHER): Payer: 59

## 2013-08-03 DIAGNOSIS — J309 Allergic rhinitis, unspecified: Secondary | ICD-10-CM

## 2013-08-05 NOTE — Assessment & Plan Note (Signed)
He insists there is less irritant exposure in his job now. It sounds as if the major trigger through the winter has been virus colds Plan-finish Z-Pak. Advance allergy vaccine to 1:10

## 2013-08-09 ENCOUNTER — Ambulatory Visit: Payer: 59

## 2013-08-17 ENCOUNTER — Ambulatory Visit (INDEPENDENT_AMBULATORY_CARE_PROVIDER_SITE_OTHER): Payer: 59

## 2013-08-17 DIAGNOSIS — J309 Allergic rhinitis, unspecified: Secondary | ICD-10-CM

## 2013-08-24 ENCOUNTER — Ambulatory Visit (INDEPENDENT_AMBULATORY_CARE_PROVIDER_SITE_OTHER): Payer: 59

## 2013-08-24 DIAGNOSIS — J309 Allergic rhinitis, unspecified: Secondary | ICD-10-CM

## 2013-08-27 ENCOUNTER — Telehealth: Payer: Self-pay | Admitting: Internal Medicine

## 2013-08-27 ENCOUNTER — Encounter: Payer: Self-pay | Admitting: *Deleted

## 2013-08-27 MED ORDER — PREDNISONE 10 MG PO TABS: ORAL_TABLET | ORAL | Status: DC

## 2013-08-27 NOTE — Telephone Encounter (Signed)
Pt is aware of CY's response. Letter will be drafted and printed for CY to sign. Rx for prednisone has been printed. Pt is aware the these will be placed at the front desk for pick up.

## 2013-08-27 NOTE — Telephone Encounter (Signed)
Pt states that he missed work on Friday due to increased asthma symptoms (SOB and wheezing).  Pt states he took Prednisone 3 tablets of 20mg  each that he had left from [facility] and he feels better now.  Pt is requesting a letter for work stating that he missed Friday due to Asthma and will return tonight.

## 2013-08-27 NOTE — Telephone Encounter (Signed)
Per CY-ok for work letter and also RX Prednisone 10 mg #20 take 4 x 2 days, 3 x 2 days,  2 x 2 days, 1 x 2 days, then stop no refills to HOLD in case he feels he needs it. Thanks.

## 2013-08-31 ENCOUNTER — Ambulatory Visit (INDEPENDENT_AMBULATORY_CARE_PROVIDER_SITE_OTHER): Payer: 59

## 2013-08-31 DIAGNOSIS — J309 Allergic rhinitis, unspecified: Secondary | ICD-10-CM

## 2013-09-07 ENCOUNTER — Ambulatory Visit (INDEPENDENT_AMBULATORY_CARE_PROVIDER_SITE_OTHER): Payer: 59

## 2013-09-07 DIAGNOSIS — J309 Allergic rhinitis, unspecified: Secondary | ICD-10-CM

## 2013-09-14 ENCOUNTER — Ambulatory Visit (INDEPENDENT_AMBULATORY_CARE_PROVIDER_SITE_OTHER): Payer: 59

## 2013-09-14 DIAGNOSIS — J309 Allergic rhinitis, unspecified: Secondary | ICD-10-CM

## 2013-09-19 ENCOUNTER — Emergency Department (HOSPITAL_COMMUNITY)
Admission: EM | Admit: 2013-09-19 | Discharge: 2013-09-19 | Disposition: A | Discharge status: Home / Self Care | Payer: 59 | Attending: Emergency Medicine | Admitting: Emergency Medicine

## 2013-09-19 ENCOUNTER — Encounter (HOSPITAL_COMMUNITY): Payer: Self-pay | Admitting: Emergency Medicine

## 2013-09-19 ENCOUNTER — Emergency Department (HOSPITAL_COMMUNITY): Payer: 59

## 2013-09-19 DIAGNOSIS — J441 Chronic obstructive pulmonary disease with (acute) exacerbation: Secondary | ICD-10-CM | POA: Insufficient documentation

## 2013-09-19 DIAGNOSIS — J45901 Unspecified asthma with (acute) exacerbation: Secondary | ICD-10-CM

## 2013-09-19 DIAGNOSIS — Z880 Allergy status to penicillin: Secondary | ICD-10-CM | POA: Insufficient documentation

## 2013-09-19 DIAGNOSIS — Z79899 Other long term (current) drug therapy: Secondary | ICD-10-CM | POA: Insufficient documentation

## 2013-09-19 DIAGNOSIS — IMO0002 Reserved for concepts with insufficient information to code with codable children: Secondary | ICD-10-CM | POA: Insufficient documentation

## 2013-09-19 DIAGNOSIS — Z87891 Personal history of nicotine dependence: Secondary | ICD-10-CM | POA: Insufficient documentation

## 2013-09-19 MED ORDER — IPRATROPIUM BROMIDE 0.02 % IN SOLN
1.0000 mg | Freq: Once | RESPIRATORY_TRACT | Status: AC
  Administered 2013-09-19: 1 mg via RESPIRATORY_TRACT
  Filled 2013-09-19: qty 5

## 2013-09-19 MED ORDER — PREDNISONE 20 MG PO TABS: 40.0000 mg | ORAL_TABLET | Freq: Every day | ORAL | Status: DC

## 2013-09-19 MED ORDER — ALBUTEROL (5 MG/ML) CONTINUOUS INHALATION SOLN
10.0000 mg/h | INHALATION_SOLUTION | Freq: Once | RESPIRATORY_TRACT | Status: AC
  Administered 2013-09-19: 10 mg/h via RESPIRATORY_TRACT
  Filled 2013-09-19: qty 20

## 2013-09-19 MED ORDER — PREDNISONE 50 MG PO TABS
60.0000 mg | ORAL_TABLET | Freq: Once | ORAL | Status: AC
  Administered 2013-09-19: 60 mg via ORAL
  Filled 2013-09-19 (×2): qty 1

## 2013-09-19 NOTE — ED Provider Notes (Signed)
CSN: [id number]     Arrival date & time 09/19/13  [id number] History   First MD Initiated Contact with Patient 09/19/13 [phone]     Chief Complaint  Patient presents with  . Shortness of Breath      HPI Pt was seen at 0850.  Per pt, c/o gradual onset and worsening of persistent cough, wheezing and SOB for the past 2 to 3 days.  Describes her symptoms as "my asthma is acting up."  Has been using home MDI and nebs with transient relief.  Denies CP/palpitations, no back pain, no abd pain, no N/V/D, no fevers, no rash. The symptoms have been associated with no other complaints. The patient has a significant history of similar symptoms previously, recently being evaluated for this complaint and multiple prior evals for same.       Past Medical History  Diagnosis Date  . Asthma   . Seasonal allergies    Past Surgical History  Procedure Laterality Date  . None     Family History  Problem Relation Age of Onset  . Emphysema Father   . Lung cancer Mother    History  Substance Use Topics  . Smoking status: Former Smoker -- 0.50 packs/day for 11.5 years    Types: Cigarettes    Quit date: 04/09/2010  . Smokeless tobacco: Never Used     Comment: started at age 38.  1/2 ppd.    . Alcohol Use: No    Review of Systems ROS: Statement: All systems negative except as marked or noted in the HPI; Constitutional: Negative for fever and chills. ; ; Eyes: Negative for eye pain, redness and discharge. ; ; ENMT: Negative for ear pain, hoarseness, nasal congestion, sinus pressure and sore throat. ; ; Cardiovascular: Negative for chest pain, palpitations, diaphoresis, and peripheral edema. ; ; Respiratory: +cough, wheezing, SOB. Negative for stridor. ; ; Gastrointestinal: Negative for nausea, vomiting, diarrhea, abdominal pain, blood in stool, hematemesis, jaundice and rectal bleeding. ; ; Genitourinary: Negative for dysuria, flank pain and hematuria. ; ; Musculoskeletal: Negative for back pain and neck pain.  Negative for swelling and trauma.; ; Skin: Negative for pruritus, rash, abrasions, blisters, bruising and skin lesion.; ; Neuro: Negative for headache, lightheadedness and neck stiffness. Negative for weakness, altered level of consciousness , altered mental status, extremity weakness, paresthesias, involuntary movement, seizure and syncope.      Allergies  Azithromycin; Alvesco; Anoro ellipta; Banana; and Penicillins  Home Medications   Prior to Admission medications   Medication Sig Start Date End Date Taking? Authorizing Provider  albuterol (PROVENTIL HFA;VENTOLIN HFA) 108 (90 BASE) MCG/ACT inhaler Inhale 2 puffs into the lungs every 4 (four) hours as needed for wheezing or shortness of breath. 04/11/13 04/11/14  Waymon Budgelinton D Young, MD  EPINEPHrine (EPIPEN) 0.3 mg/0.3 mL SOAJ injection Inject into thigh for severe allergic reaction 03/05/13   Waymon Budgelinton D Young, MD  mometasone-formoterol [facility](DULERA) 200-5 MCG/ACT AERO Inhale 2 puffs into the lungs 2 (two) times daily. 03/28/13   Waymon Budgelinton D Young, MD  montelukast (SINGULAIR) 10 MG tablet Take 10 mg by mouth at bedtime.    Historical Provider, MD  predniSONE (DELTASONE) 10 MG tablet Take 4 x 2 days, 3 x 2 days,  2 x 2 days, 1 x 2 days, then stop 08/27/13   Waymon Budgelinton D Young, MD  predniSONE (DELTASONE) 20 MG tablet Take 3 tablets (60 mg total) by mouth daily before breakfast. 07/27/13   Rhetta MuraJai-Gurmukh Samtani, MD   BP 152/94  Pulse 76  Temp(Src) 97.7 F (36.5 C) (Oral)  Resp 18  Ht 6\' 3"  (1.905 m)  Wt 150 lb (68.04 kg)  BMI 18.75 kg/m2  SpO2 93% Physical Exam 0855: Physical examination:  Nursing notes reviewed; Vital signs and O2 SAT reviewed;  Constitutional: Well developed, Well nourished, Well hydrated, In no acute distress; Head:  Normocephalic, atraumatic; Eyes: EOMI, PERRL, No scleral icterus; ENMT: TM's clear bilat  +edemetous nasal turbinates bilat with clear rhinorrhea. Mouth and pharynx normal, Mucous membranes moist; Neck: Supple, Full range of  motion, No lymphadenopathy; Cardiovascular: Regular rate and rhythm, No murmur, rub, or gallop; Respiratory: Breath sounds diminished & equal bilaterally, faint scattered wheezes. No audible wheezing. Speaking full sentences, no retrax or access mm use. Normal respiratory effort/excursion; Chest: Nontender, Movement normal; Abdomen: Soft, Nontender, Nondistended, Normal bowel sounds; Genitourinary: No CVA tenderness; Extremities: Pulses normal, No tenderness, No edema, No calf edema or asymmetry.; Neuro: AA&Ox3, Major CN grossly intact.  Speech clear. No gross focal motor or sensory deficits in extremities.; Skin: Color normal, Warm, Dry.   ED Course  Procedures     EKG Interpretation None      MDM  MDM Reviewed: previous chart, nursing note and vitals Reviewed previous: x-ray Interpretation: x-ray    Dg Chest 2 View 09/19/2013   CLINICAL DATA:  Shortness of breath; history of asthma  EXAM: CHEST  2 VIEW  COMPARISON:  DG CHEST 1V PORT dated 07/23/2013; DG CHEST 2 VIEW dated 07/17/2013; DG CHEST 2 VIEW dated 01/11/2013  FINDINGS: The lungs are mildly hyperinflated. There is no focal infiltrate. The cardiopericardial silhouette is normal in size. The pulmonary vascularity is not engorged. The mediastinum is normal in width. There is no pleural effusion. There is mild stable prominence of central pulmonary vascularity. The observed portions of the bony thorax appear normal.  IMPRESSION: There is hyperinflation consistent with reactive airway disease or COPD. There is no evidence of pneumonia.   Electronically Signed   By: David  [place]   On: 09/19/2013 08:56    1155:  Pt states he "feels better" after neb and steroid.  NAD, lungs CTA bilat, no wheezing, resps easy, speaking full sentences, Sats 99% R/A.  Pt ambulated around the ED with resps easy, NAD.  Pt has gotten himself dressed and states he wants to go home now. States he has enough MDI and neb solution at home and does not need a rx for  either. Dx and testing d/w pt.  Questions answered.  Verb understanding, agreeable to d/c home with outpt f/u.    Laray AngerKathleen M Benjamine Strout, DO 09/21/13 2144

## 2013-09-19 NOTE — ED Notes (Addendum)
Pt states productive cough, clear, along with sob x 3 days. States SOB became worse this morning. Hx of asthma. NAD at this time.

## 2013-09-19 NOTE — ED Notes (Signed)
nad noted prior to dc. Dc instructions reviewed and explained. Voiced understanding. 1 script given to pt and ambulated out without difficulty.

## 2013-09-19 NOTE — ED Notes (Signed)
Pt report mask coming loose and solution falling out. Solution just about gone from nebulizer. Resp paged and aware.

## 2013-09-19 NOTE — Discharge Instructions (Signed)
°Emergency Department Resource Guide °1) Find a Doctor and Pay Out of Pocket °Although you won't have to find out who is covered by your insurance plan, it is a good idea to ask around and get recommendations. You will then need to call the office and see if the doctor you have chosen will accept you as a new patient and what types of options they offer for patients who are self-pay. Some doctors offer discounts or will set up payment plans for their patients who do not have insurance, but you will need to ask so you aren't surprised when you get to your appointment. ° °2) Contact Your Local Health Department °Not all health departments have doctors that can see patients for sick visits, but many do, so it is worth a call to see if yours does. If you don't know where your local health department is, you can check in your phone book. The CDC also has a tool to help you locate your state's health department, and many state websites also have listings of all of their local health departments. ° °3) Find a Walk-in Clinic °If your illness is not likely to be very severe or complicated, you may want to try a walk in clinic. These are popping up all over the country in pharmacies, drugstores, and shopping centers. They're usually staffed by nurse practitioners or physician assistants that have been trained to treat common illnesses and complaints. They're usually fairly quick and inexpensive. However, if you have serious medical issues or chronic medical problems, these are probably not your best option. ° °No Primary Care Doctor: °- Call Health Connect at  832-8000 - they can help you locate a primary care doctor that  accepts your insurance, provides certain services, etc. °- Physician Referral Service- 1-800-533-3463 ° °Chronic Pain Problems: °Organization         Address  Phone   Notes  °[facility]  ([phone]) [phone] Patients need to be referred by their primary care doctor.  ° °Medication  Assistance: °Organization         Address  Phone   Notes  °[place] [facility] [address] °[place], [place] [address] (336) 641-8030 --Must be a resident of [place] °-- Must have NO insurance coverage whatsoever (no Medicaid/ Medicare, etc.) °-- The pt. MUST have a primary care doctor that directs their care regularly and follows them in the community °  °[facility]  (866) 331-1348   °[employer] Way  (888) 892-1162   ° °Agencies that provide inexpensive medical care: °Organization         Address  Phone   Notes  °[facility]  (336) 832-8035   °[facility]    (336) 832-7272   °[facility] [address] °[place], [place] [address] (336) 832-4777   °[facility] [address], °[place] (336) 271-4999   °[facility]    (336) 373-0678   °[facility]    (336) 272-1050   °[facility] ° [address], [place] Phone:  (336) 832-4444, Fax:  (336) 832-4440 Hours of Operation:  9 am - 6 pm, M-F.  Also accepts Medicaid/Medicare and self-pay.  °[facility] ° [address], [place] Phone: (336) [phone], Fax: (336) [phone]. Hours of Operation:  8:30 am - 5:30 pm, M-F.  Also accepts Medicaid and self-pay.  °[facility] [place] [address], [place] Phone: (336) 878-6027   °[facility] [address], [place], [place] (336)723-1848, Ext. 123 Mondays & Thursdays: 7-9 AM.  First 15 patients are seen on a first come, first serve basis. °  ° °Medicaid-accepting [place] Providers: ° °Organization         Address  Phone   Notes  °[facility] [address], [place] (336) 641-2100 Also accepts self-pay patients.  °[facility] [address], [place] ° (336) 856-9996   °[facility] [address], [place]  (336) 288-8857   °[facility] [address] [place] [address], [place] (336) 299-7000   °Veita Bland [address], [place]  ° (336) 373-1557 Only accepts [place] Access Medicaid patients after they have their name applied to their card.  ° °Self-Pay (no insurance) in [place]: ° °Organization         Address  Phone   Notes  °Sickle Cell Patients, [facility] [address], [place] (336) 832-1970   °[facility] [address], [place] (336) 832-4400   °[facility] ° [address], [place] (336) 992-4800   °[facility]/Dr. Osei-Bonsu ° [address] [place] [address], [place] or [address], [place] (336) 841-8500 Phone number for both [place] and [place] locations is the same.  °[facility] [address], [place] (336) 299-0000   °[facility] [address] [place] [address], [place] or [address] (336) 852-7530 °(336) 878-2260   °[facility] [address], [place] (336) 350-1642, phone; (336) 294-5005, fax Sees patients 1st and 3rd Saturday of every month.  Must not qualify for public or private insurance (i.e. Medicaid, Medicare, [place] Health Choice, Veterans' Benefits) • Household income should be no more than 200% of the poverty level •The clinic cannot treat you if you are pregnant or think you are pregnant • Sexually transmitted diseases are not treated at the clinic.  ° ° °Dental Care: °Organization         Address  Phone  Notes  °[place] [facility] [address], [place] (336) 641-6152 Accepts children up to age 21 who are enrolled in Medicaid or [place] Health Choice; pregnant women with a Medicaid card; and children who have applied for Medicaid or [place] Health Choice, but were declined, whose parents can pay a reduced fee at time of service.  °[place]  [facility] [place]  [address], [place] (336) 641-7733 Accepts children up to age 21 who are enrolled in Medicaid or [place] Health Choice; pregnant women with a Medicaid card; and children who have applied for Medicaid or [place] Health Choice, but were declined, whose parents can pay a reduced fee at time of service.  °[facility] ° [address], [place] (336) 641-4533 Patients are seen by appointment only. Walk-ins are not accepted. [facility] will see patients 18 years of age and older. °Monday - Tuesday (8am-5pm) °Most Wednesdays (8:30-5pm) °$30 per visit, cash only  °[facility] ° [address], [place] (336) 641-4533 Patients are seen by appointment only. Walk-ins are not accepted. [facility] will see patients 18 years of age and older. °One   Wednesday Evening (Monthly: Volunteer Based).  $30 per visit, cash only  °[facility]  (919) [phone] for adults; Children under age 4, call Graduate Pediatric Dentistry at ([phone]) [phone]. Children aged 4-14, please call ([phone]) [phone] to request a pediatric application. ° Dental services are provided in all areas of dental care including fillings, crowns and bridges, complete and partial dentures, implants, gum treatment, root canals, and extractions. Preventive care is also provided. Treatment is provided to both adults and children. °Patients are selected via a lottery and there is often a waiting list. °  °[facility] [address], °[place] ° (336) 763-8833 [email or web address] °  °[facility] [address], [place], [place] (336)723-1848, Ext. 123 Second and Fourth Thursday of each month, opens at 6:30 AM; Clinic ends at 9 AM.  Patients are seen on a first-come first-served basis, and a limited number are seen during each clinic.  ° °[facility] ° [address], [place], [place] (336) 723-7904    Eligibility Requirements °You must have lived in [place], [place], or [place] counties for at least the last three months. °  You cannot be eligible for state or federal sponsored healthcare insurance, including Veterans Administration, Medicaid, or Medicare. °  You generally cannot be eligible for healthcare insurance through your employer.  °  How to apply: °Eligibility screenings are held every Tuesday and Wednesday afternoon from 1:00 pm until 4:00 pm. You do not need an appointment for the interview!  °[facility] [address], [place], [place] 336-631-2330   °[facility]  336-342-8273   °[facility]  336-703-3100   °[facility]  336-570-6415   ° °Behavioral Health Resources in the Community: °Intensive Outpatient Programs °Organization         Address  Phone  Notes  °[facility] [address], [place], [place] 336-878-6098   °[facility] [address], [place], [place] 336-832-9800   °[facility] [address], [place], [place] ° 336-882-2125   °[facility] [address],  °[place], [place] 1-800-853-5163 or 336-641-4981   °Substance Abuse Resources °Organization         Address  Phone  Notes  °[facility]  336-882-2125   °[facility]  336-784-9470   °[facility]  336-285-9073   °[facility]  336-845-3988   °Residential & Outpatient Substance Abuse Program  1-800-659-3381   °Psychological Services °Organization         Address  Phone  Notes  °[facility]  336- 832-9600   °[facility]  336- 378-7881   °[facility] [address], [place] 1-800-853-5163 or 336-641-4981   ° °Mobile Crisis Teams °Organization         Address  Phone  Notes  °[facility], Mobile Crisis Care Unit  1-877-626-1772   °Assertive °[facility] ° [address]  [place], [place] 336-834-9664   °Sharon DeEsch [address] °[place] 336-554-5454   ° °Self-Help/Support Groups °Organization         Address  Phone             Notes  °[facility]oc[facility] of  - variety of support groups  336- 373-1402 Call for more information  °Narcotics Anonymous (NA), [facility] [address], °[place]  2 meetings at this location  ° °  Residential Treatment Programs Organization         Address  Phone  Notes  [facility] [address],    [place]  [phone]-(603) [phone]   [facility]  [address], [place] [phone], [place], [place] [phone]   [facility] [address], [place] [phone] Admissions: 8am-3pm M-F  [facility] [address]    [place], [place] [phone]   [facility] [address], [place], [place] [phone]   [facility] [address]  [place], [place] [phone]   [facility] Intensive Outpatient [address], [place], [place] [phone])[phone]   [facility] ([facility] [address]  [place], [place] [phone] or [phone]   [facility] ([facility] [address] [place], [place] [phone] Accepts Medicaid  [facility] [address]  [place] [phone] Substance Abuse/Addiction Treatment   [facility] Organization         Address  Phone  Notes  [facility]  531-209-2168(888) (269) [phone]   Angie FavaJulie Brannon, PhD [address], [address] [place], [place]   [phone](336) [phone] or (947)707-1317(336) [phone]   [facility]   [address] [place], [place] ([phone](336) [phone]   [facility] [address], [place], [place] [phone](336) [phone] Insurance/Medicaid/sponsorship through [facility]  [facility] [address]                                    [place], [place] [phone]) [phone](336) [phone] Therapy/tele-psych/case    [facility] [address]   [place], [place] [phone](336) [phone]    Dr. Lolly MustacheArfeen  [phone](336) [phone]   [facility] [place]  [employer] [facility] 1) [address], [place] 2) [address], [place] 3)  [address], [place] [phone](336) [phone] [phone](336) [phone]  [phone](336) [phone]   [facility] Child Abuse Hotline 5862798780(336) (201)123-9365 or [phone](336) [phone] (After Hours)       Take the prescription as directed.  Use your albuterol inhaler (2 to 4 puffs) or your albuterol nebulizer (1 unit dose) every 4 hours for the next 7 days, then as needed for cough, wheezing, or shortness of breath.  Call your regular Pulmonary doctor today to schedule a follow up appointment within the next 2 days.  Return to the Emergency Department immediately sooner if worsening.

## 2013-09-21 ENCOUNTER — Ambulatory Visit: Payer: 59

## 2013-10-03 ENCOUNTER — Other Ambulatory Visit: Payer: Self-pay | Admitting: Internal Medicine

## 2013-10-04 MED ORDER — PREDNISONE 10 MG PO TABS: ORAL_TABLET | ORAL | Status: DC

## 2013-10-04 NOTE — Telephone Encounter (Signed)
Spoke with the pt  He is requesting pred taper  Having wheezing, increased SOB and prod cough with minimal clear sputum x 3 days  Please advise if okay to refill pred, thanks! Allergies  Allergen Reactions  . Azithromycin Rash  . Alvesco [Ciclesonide]     Possible reaction- rash  . Anoro Ellipta [Umeclidinium-Vilanterol]     Possible- rash  . Banana Swelling    Throat swelling   . Penicillins Rash   Current Outpatient Prescriptions on File Prior to Visit  Medication Sig Dispense Refill  . albuterol (PROVENTIL HFA;VENTOLIN HFA) 108 (90 BASE) MCG/ACT inhaler Inhale 2 puffs into the lungs every 4 (four) hours as needed for wheezing or shortness of breath.  1 Inhaler  prn  . albuterol (PROVENTIL) (2.5 MG/3ML) 0.083% nebulizer solution Take 2.5 mg by nebulization every 6 (six) hours as needed for wheezing or shortness of breath.      . EPINEPHrine (EPIPEN) 0.3 mg/0.3 mL SOAJ injection Inject into thigh for severe allergic reaction  1 Device  prn  . mometasone-formoterol (DULERA) 200-5 MCG/ACT AERO Inhale 2 puffs into the lungs 2 (two) times daily.  1 Inhaler  5  . montelukast (SINGULAIR) 10 MG tablet Take 10 mg by mouth at bedtime.      . predniSONE (DELTASONE) 20 MG tablet Take 2 tablets (40 mg total) by mouth daily. Start 09/20/2013  10 tablet  0   No current facility-administered medications on file prior to visit.

## 2013-10-04 NOTE — Telephone Encounter (Signed)
Pt calling for a refill on prednisone [facility] [place].Caren Griffins

## 2013-10-04 NOTE — Telephone Encounter (Signed)
Per CY---  Ok to refill the prednisone 10 mg  #20 4 x 2 3x2 2x2  1x2  Then stop.    Called and spoke with pt and he is aware of pred taper that has been sent to the pharmacy.

## 2013-10-09 ENCOUNTER — Encounter: Payer: Self-pay | Admitting: Internal Medicine

## 2013-10-25 ENCOUNTER — Other Ambulatory Visit: Payer: Self-pay | Admitting: Internal Medicine

## 2013-10-28 ENCOUNTER — Encounter (HOSPITAL_COMMUNITY): Payer: Self-pay | Admitting: Emergency Medicine

## 2013-10-28 ENCOUNTER — Emergency Department (HOSPITAL_COMMUNITY)
Admission: EM | Admit: 2013-10-28 | Discharge: 2013-10-28 | Disposition: A | Discharge status: Home / Self Care | Payer: 59 | Attending: Emergency Medicine | Admitting: Emergency Medicine

## 2013-10-28 ENCOUNTER — Emergency Department (HOSPITAL_COMMUNITY): Payer: 59

## 2013-10-28 DIAGNOSIS — J45901 Unspecified asthma with (acute) exacerbation: Secondary | ICD-10-CM | POA: Insufficient documentation

## 2013-10-28 DIAGNOSIS — Z79899 Other long term (current) drug therapy: Secondary | ICD-10-CM | POA: Insufficient documentation

## 2013-10-28 DIAGNOSIS — J4520 Mild intermittent asthma, uncomplicated: Secondary | ICD-10-CM

## 2013-10-28 DIAGNOSIS — Z880 Allergy status to penicillin: Secondary | ICD-10-CM | POA: Insufficient documentation

## 2013-10-28 DIAGNOSIS — Z87891 Personal history of nicotine dependence: Secondary | ICD-10-CM | POA: Insufficient documentation

## 2013-10-28 MED ORDER — CIPROFLOXACIN HCL 500 MG PO TABS: 500.0000 mg | ORAL_TABLET | Freq: Two times a day (BID) | ORAL | Status: DC

## 2013-10-28 MED ORDER — PREDNISONE 50 MG PO TABS
60.0000 mg | ORAL_TABLET | Freq: Once | ORAL | Status: AC
  Administered 2013-10-28: 60 mg via ORAL
  Filled 2013-10-28 (×2): qty 1

## 2013-10-28 MED ORDER — ALBUTEROL SULFATE (2.5 MG/3ML) 0.083% IN NEBU
5.0000 mg | INHALATION_SOLUTION | Freq: Once | RESPIRATORY_TRACT | Status: AC
  Administered 2013-10-28: 5 mg via RESPIRATORY_TRACT
  Filled 2013-10-28: qty 6

## 2013-10-28 MED ORDER — IPRATROPIUM-ALBUTEROL 0.5-2.5 (3) MG/3ML IN SOLN: 3.0000 mL | Freq: Once | RESPIRATORY_TRACT | Status: DC

## 2013-10-28 MED ORDER — HYDROCOD POLST-CHLORPHEN POLST 10-8 MG/5ML PO LQCR: 5.0000 mL | Freq: Two times a day (BID) | ORAL | Status: DC | PRN

## 2013-10-28 MED ORDER — PREDNISONE 20 MG PO TABS: ORAL_TABLET | ORAL | Status: DC

## 2013-10-28 NOTE — ED Notes (Signed)
Wheezing for one month, worse in past few days. Tonight unrelieved by home nebulizer

## 2013-10-28 NOTE — ED Provider Notes (Signed)
CSN: [id number]     Arrival date & time 10/28/13  0607 History  This chart was scribed for Donnetta HutchingBrian Cook, MD by Karle PlumberJennifer Tensley, ED Scribe. This patient was seen in room APA19/APA19 and the patient's care was started at 7:12 AM.  Chief Complaint  Patient presents with  . Wheezing   The history is provided by the patient. No language interpreter was used.   HPI Comments:  Drew Sandoval is a 38 y.o. male with h/o seasonal allergies and asthma, who presents to the Emergency Department complaining of worsening wheezing that started approximately one month ago. He reports an associated productive cough of phlegm. He reports using his nebulizer and inhalers at home. He reports taking Prednisone last month but is not currently taking it now. 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 Pt denies smoking. He denies CP. He states his pulmonologist is Dr. [name]. His PCP is Dr. Parke SimmersBland.  Past Medical History  Diagnosis Date  . Asthma   . Seasonal allergies    Past Surgical History  Procedure Laterality Date  . None     Family History  Problem Relation Age of Onset  . Emphysema Father   . Lung cancer Mother    History  Substance Use Topics  . Smoking status: Former Smoker -- 0.50 packs/day for 11.5 years    Types: Cigarettes    Quit date: 04/09/2010  . Smokeless tobacco: Never Used     Comment: started at age 38.  1/2 ppd.    . Alcohol Use: No    Review of Systems A complete 10 system review of systems was obtained and all systems are negative except as noted in the HPI and PMH.   Allergies  Azithromycin; Alvesco; Anoro ellipta; Banana; and Penicillins  Home Medications   Prior to Admission medications   Medication Sig Start Date End Date Taking? Authorizing Provider  albuterol (PROVENTIL HFA;VENTOLIN HFA) 108 (90 BASE) MCG/ACT inhaler Inhale 2 puffs into the lungs every 4 (four) hours as needed for wheezing or shortness of breath. 04/11/13 04/11/14  Waymon Budgelinton D Young, MD   albuterol (PROVENTIL) (2.5 MG/3ML) 0.083% nebulizer solution Take 2.5 mg by nebulization every 6 (six) hours as needed for wheezing or shortness of breath.    Historical Provider, MD  chlorpheniramine-HYDROcodone (TUSSIONEX PENNKINETIC ER) 10-8 MG/5ML LQCR Take 5 mLs by mouth every 12 (twelve) hours as needed for cough. 10/28/13   Donnetta HutchingBrian Cook, MD  ciprofloxacin (CIPRO) 500 MG tablet Take 1 tablet (500 mg total) by mouth 2 (two) times daily. One po bid x 7 days 10/28/13   Donnetta HutchingBrian Cook, MD  EPINEPHrine [facility](EPIPEN) 0.3 mg/0.3 mL SOAJ injection Inject into thigh for severe allergic reaction 03/05/13   Waymon Budgelinton D Young, MD  mometasone-formoterol [facility](DULERA) 200-5 MCG/ACT AERO Inhale 2 puffs into the lungs 2 (two) times daily. 03/28/13   Waymon Budgelinton D Young, MD  montelukast (SINGULAIR) 10 MG tablet TAKE 1 TABLET BY MOUTH AT BEDTIME 10/25/13   Waymon Budgelinton D Young, MD  predniSONE (DELTASONE) 10 MG tablet Take 4 tabs x 2 days, 3 tabs x 2 days, 2 tabs x 2 days, 1 tab x 2 days then stop 10/04/13   Waymon Budgelinton D Young, MD  predniSONE (DELTASONE) 20 MG tablet Take 2 tablets (40 mg total) by mouth daily. Start 09/20/2013 09/19/13   Laray AngerKathleen M McManus, DO  predniSONE (DELTASONE) 20 MG tablet 3 tabs po day one, then 2 po daily x 4 days 10/28/13   Donnetta HutchingBrian Cook, MD   Triage  Vitals: BP 122/77  Pulse 77  Temp(Src) 97.9 F (36.6 C) (Oral)  Resp 16  Ht 6\' 4"  (1.93 m)  Wt 150 lb (68.04 kg)  BMI 18.27 kg/m2  SpO2 93% Physical Exam  Nursing note and vitals reviewed. Constitutional: He is oriented to person, place, and time. He appears well-developed and well-nourished.  HENT:  Head: Normocephalic and atraumatic.  Eyes: Conjunctivae and EOM are normal. Pupils are equal, round, and reactive to light.  Neck: Normal range of motion. Neck supple.  Cardiovascular: Normal rate, regular rhythm and normal heart sounds.   Pulmonary/Chest: Effort normal. No respiratory distress. He has wheezes. He has no rales.  Bilateral expiratory wheezing.   Abdominal: Soft. Bowel sounds are normal.  Musculoskeletal: Normal range of motion.  Neurological: He is alert and oriented to person, place, and time.  Skin: Skin is warm and dry.  Psychiatric: He has a normal mood and affect. His behavior is normal.    ED Course  Procedures (including critical care time) DIAGNOSTIC STUDIES: Oxygen Saturation is 93% on RA, low by my interpretation.   COORDINATION OF CARE: 7:17 AM- Will order CXR. Will prescribe Prednisone, cough medication, and antibiotics. Pt verbalizes understanding and agrees to plan.  Medications  albuterol (PROVENTIL) (2.5 MG/3ML) 0.083% nebulizer solution 5 mg (5 mg Nebulization Given 10/28/13 0644)  predniSONE (DELTASONE) tablet 60 mg (60 mg Oral Given 10/28/13 0636)    Labs Review Labs Reviewed - No data to display  Imaging Review Dg Chest 2 View  10/28/2013   CLINICAL DATA:  Wheezing with history of asthma  EXAM: CHEST  2 VIEW  COMPARISON:  09/19/2013  FINDINGS: Hyperinflation consistent with COPD. Heart size and vascular pattern normal. Lungs are clear with no infiltrate consolidation or effusion.  IMPRESSION: Hyperinflation, otherwise negative.  No change from prior study.   Electronically Signed   By: Esperanza Heiraymond  Rubner M.D.   On: 10/28/2013 07:59     EKG Interpretation None      MDM   Final diagnoses:  Asthma, mild intermittent, uncomplicated    Patient is hemodynamically stable. He feels better after breathing treatment. He has been coughing and wheezing for one month.  Rx Cipro, prednisone, Tussionex  I personally performed the services described in this documentation, which was scribed in my presence. The recorded information has been reviewed and is accurate.    Donnetta HutchingBrian Cook, MD 10/28/13 [phone]

## 2013-10-28 NOTE — Discharge Instructions (Signed)
Chest x-ray shows no pneumonia. Prescription for antibiotic, penicillin, cough syrup. Continue breathing treatments. Followup your primary care Dr.

## 2013-11-19 ENCOUNTER — Ambulatory Visit (INDEPENDENT_AMBULATORY_CARE_PROVIDER_SITE_OTHER): Payer: BC Managed Care – PPO | Admitting: Internal Medicine

## 2013-11-19 ENCOUNTER — Encounter: Payer: Self-pay | Admitting: Internal Medicine

## 2013-11-19 VITALS — BP 118/70 | HR 76 | Ht 75.0 in | Wt 146.4 lb

## 2013-11-19 DIAGNOSIS — D721 Eosinophilia, unspecified: Secondary | ICD-10-CM

## 2013-11-19 DIAGNOSIS — J4550 Severe persistent asthma, uncomplicated: Secondary | ICD-10-CM

## 2013-11-19 DIAGNOSIS — L239 Allergic contact dermatitis, unspecified cause: Secondary | ICD-10-CM

## 2013-11-19 DIAGNOSIS — L259 Unspecified contact dermatitis, unspecified cause: Secondary | ICD-10-CM

## 2013-11-19 DIAGNOSIS — J45909 Unspecified asthma, uncomplicated: Secondary | ICD-10-CM

## 2013-11-19 MED ORDER — TRAMADOL HCL 50 MG PO TABS: ORAL_TABLET | ORAL | Status: DC

## 2013-11-19 MED ORDER — THEOPHYLLINE ER 200 MG PO TB12: ORAL_TABLET | ORAL | Status: DC

## 2013-11-19 NOTE — Assessment & Plan Note (Signed)
Allergy shots stopped because he hasn't gotten here in 2 months. Not clear him much difference they made because of difficulty continuing treatment. He doesn't recognize specific triggers but has had a significant atopic component on testing. Plan-try tramadol as a daytime cough suppressant, not replacing bronchodilators. Try theophylline again. Hopefully to reduce use of systemic steroids.

## 2013-11-19 NOTE — Assessment & Plan Note (Signed)
controlled 

## 2013-11-19 NOTE — Patient Instructions (Signed)
Continue routine meds  Script for tramadol to try for daytime cough You can also use an otc cough med like Delsym in between doses of tramadol if needed  Script to try theophylline for breathing and cough. Take this med regularly, 1 twice daily after a meal, with food in your stomach. If it too strong, break it in half and take 1/2 tab twice daily  We are stopping allergy shots for now, since you can't get down here easily.

## 2013-11-19 NOTE — Progress Notes (Signed)
Patient ID: Drew Sandoval, male    DOB: July 06, 1975, 38 y.o.   MRN: [id number]  HPI 11/04/10- 38 yo former smoker followed for severe asthma, chronic sinusitis, hype-reosinophilia and hyper IgE Sent to me by Dr Drew Sandoval for allergy evaluation. 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 Allergy profile- 08/11/10 IgE 1,747 with broad specific elevation Last here- August 31, 2010.- note reviewed. Since last here has been in hospital again, getting out 2 weeks ago. Feels well today.  Comes today as planned for allergy skin testing Skin test: Strongly positive especially grass, tree, dust mite  01/04/11- 89 yo former smoker followed for severe asthma, chronic sinusitis, hyper-eosinophilia and hyper IgE.  wife is with him Sent to me by Dr Drew Sandoval for allergy evaluation. 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 Allergy vaccine was started 11/09/2010 and is building without problems He was still on prednisone when he saw Dr Drew Sandoval recently and at that time he seemed improved after a recent exacerbation- 2 weeks ago. About 2 weeks after their visit he began coughing again. He hasn't noted any unusual exposure at work, or any sense that he had a cold.  His last IgE was 1747.  He doesn't feel "sick"- just tight and wheezy.  02/18/11-  38 yo former smoker followed for severe asthma, chronic sinusitis, hyper-eosinophilia and hyper IgE.  wife is with him Cooler weather is more comfortable for him. He continues to build allergy vaccine, now at 1:500, without problems. Some days continued to be better than others. 2 days ago thought his eyes were starting to itch and took Benadryl. We discussed comparison antihistamines. Not much sneezing. He has not needed his rescue inhaler in a long time but continues prednisone 20 mg daily. We educated on prednisone again.  04/20/11- 38  yo former smoker followed for severe asthma, chronic sinusitis, hyper-eosinophilia and hyper IgE.  wife is with him Went to ER 3 days ago- got neb and prednisone 60 mg x1. He finished our last taper yesterday. Has had flu shot. Bending over, long walks or climbing stairs, and long work shifts all make him short of breath with wheezing. He worked last night and now feels "65%". He does not think he has an acute cold or infection. He was last really well about 5 days ago. He denies heartburn. He continues building allergy vaccine now at 1:50, without problems. IgE level has been out of range for Xolair.  07/19/11- 38 yo former smoker followed for severe asthma, chronic sinusitis, hyper-eosinophilia and hyper IgE. Separated from wife have now living with his mother. Asthma control is "a whole lot better". He has not missed work. Did have to go to emergency room for kidney stone. Questions allergy vaccine causing "dark spots" on his neck. Using prednisone only occasionally. No routine wheeze, does short of breath at times.  11/18/11- 38 yo former smoker followed for severe asthma, chronic sinusitis, hyper-eosinophilia and hyper IgE. Went to ER for approximately 4 asthma attacks in the past month; He has continued allergy vaccine at 1:50. He is needed his nebulizer 2 or 3 times daily and occasional rescue inhaler. He denies infection or change in workplace exposure. He does not recognize reflux. He usually flares as soon as he finishes the prednisone taper so he and his wife asked about maintenance prednisone which we  discussed. Carefully. Sinus congestion tends to parallel his asthma episodes but he denies purulent nasal discharge or headache. We again discussed the significance of his elevated IgE and eosinophil levels. His IgE has been too high for Xolair but we will reassess. CXR 11/11/11-  IMPRESSION: No active disease. Hyperinflation again noted.  Original Report Authenticated By: Drew Sandoval, M.D.    02/07/12- 56 yo former smoker followed for severe asthma, chronic sinusitis, hyper-eosinophilia and hyper IgE. FOLLOWS FOR: post hospital at [facility] 924 through 02/04/2012. DC summary reviewed. Discharge diagnosis asthma. Asthma began during the night. He took his own nebulizer, felt better and tried to go to work but had to leave. He had presented to [facility] ER where he was treated with 3 nebulizer treatments and sent out. He went from there to the [facility] emergency room. Discharged on a steroid taper. They comment in the summary that he was significantly depressed because he had watched his father died of asthma. He admits today that he had not told us of that history before. He feels better now but still wheezing and washed out as he tapers prednisone. He denies any sense of reflux or any recognized triggering experience. In the past he had thought there was something associated with his work place as discussed in earlier notes. He comments today that he can't breathe if he sleeps on his left side. There is no history of heart disease. He quit allergy vaccine in April, 2013.  03/21/12- 38 yo former smoker followed for severe asthma, chronic sinusitis, hyper-eosinophilia and hyper IgE. FOLLOWS FOR: states that breathing is doing good, still gets allergy shots 1:50 GH. He says he is doing well since her last call for prednisone on November 4. He had to Miss work for a day and needed a note. Missed one day last week as well. He says his job is safe and he actually has most asthma flares at home, not at work. Denies any infection now. Tends to wake up in the morning wheezing without any sense of acid reflux. He can usually tell at bedtime if he is going to have trouble during the night or in the morning. Drew Sandoval is cost prohibitive. Uses nebulizer about twice daily. We are asking for records release so [facility] consent as results of cultures and chest x-ray  report.  05/22/12- 51 yo former smoker followed for severe asthma, chronic sinusitis, hyper-eosinophilia and hyper IgE.  Wife here FOLLOWS FOR: breathing was doing bad earlier today but he took a breathing tx and that helped. denies chest pain,  chest tightness or increased SOB. Last pred yeterday- we did steroid safety talk again. Started feeling tighter last night- blames"getting hot". Used his neb. CXR 02/01/12 IMPRESSION:  Peribronchial thickening and hyperlucency of the lungs compatible  with history of asthma.  No acute abnormalities.  Original Report Authenticated By: Lollie Marrow, M.D.   08/21/12- 7 yo former smoker followed for severe asthma, chronic sinusitis, hyper-eosinophilia and hyper IgE- too high for Xolair.   FOLLOWS FOR: went to hospital in [place] last night-asthmatic bronchitis. Alot of wheezing, cough; was given Zpak(has to get filled today) Has not taken allergy injection in a while due to illness ER visit in [place] last night for exacerbation of asthma again. Treated with antibiotic and still tightly wheezing. He has continued allergy vaccine 1:50 GH with no reactions. He is only clear when he is on prednisone.  09/19/12- 39 yo former smoker followed for severe asthma, chronic  sinusitis, hyper-eosinophilia and hyper IgE- too high for Xolair.   FOLLOWS FOR: pt states he has good and bad days with asthma flare ups-more good than bad though. No Er visits since last here. Not missing work. Not reacting to his allergy shots 1:50 GH.Discussed possibility of moving up to 1:10.   01/22/13- 44 yo former smoker followed for severe asthma, chronic sinusitis, hyper-eosinophilia and hyper IgE- too high for Xolair.  FOLLOWS FOR:  Waking up in nights and having asthma attacks x1 month. Also, having some snoring Tell him he has apnea. Wheezing is worse at night. Denies reflux and denies infection. Says he now feels "fine" at work.  03/05/13- 21 yo former smoker followed for severe asthma,  chronic sinusitis, hyper-eosinophilia and hyper IgE- too high for Xolair.  FOLLOWS FOR: denies any recent asthma attacks or use of prednisone; still on allergy vaccine 1:50 GH. He says he is doing very well since last here with no prednisone in 6 weeks. It may help with that the weather is cooler. He's working the same job, but no longer working with [employer]  07/18/13- 20 yo former smoker followed for severe asthma, chronic sinusitis, hyper-eosinophilia and hyper IgE- too high for Xolair.  FOLLOWS FOR:  Cough with yellow/clear mucus.  Denies wheezing and sob.   Allergy Vaccine 1:50  doing well Emergency room for acute bronchitis after a cold, treated with Z-Pak. Bothersome wheezy cough is usually dry. CXR 07/17/13 IMPRESSION:  Central interstitial and peribronchial thickening as well as mild a  per expansion, all findings at may be seen with asthma. Superimposed  acute bronchitis may well be present as well. No consolidation.  Electronically Signed  By: Bretta Bang M.D.  On: 07/17/2013 09:39  11/19/13- 49 yo former smoker followed for severe asthma, chronic sinusitis, hyper-eosinophilia and hyper IgE- too high for Xolair.  FOLLOWS FOR:  Reports has good and bad days with breathing.  When having bad day, sob, wheezing and cough non-productive.  Hasn't done Allergy vaccine in 3-4 months due to no ride here to have done Hospital admission in March, emergency room visits in May and June because of asthma/cough. He denies any awareness of reflux, adenopathy, post nasal drainage. Cough is dry or scant white sputum. Has cough syrup at night but Tessalon does not help and daytime. He feels tight today. Last prednisone several weeks ago. Easy dyspnea on exertion without chest pain, palpitation, edema. 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 CXR 10/28/13- IMPRESSION:  Hyperinflation, otherwise negative. No change from prior study.   Electronically Signed  By: Esperanza Heir M.D.  On: 10/28/2013 07:59   Review of Systems- see HPI Constitutional:   No weight loss, night sweats, fevers, chills, fatigue, lassitude. HEENT:   No headaches,  Difficulty swallowing,  Tooth/dental problems,  Sore throat,                No sneezing, ear ache,  + some sniffing, throat itches CV:  No chest pain, orthopnea, PND, swelling in lower extremities, anasarca, dizziness, palpitations GI  No heartburn, indigestion, abdominal pain, nausea, vomiting,  Resp: +  wheeze.  No coughing up of blood.  No change in color of mucus.   Skin: no acute rash GU: no dysuria,  MS:  No joint pain or swelling.   Psych:  No change in mood or affect. No depression or anxiety.  No memory loss.  Objective:   Physical Exam General- Alert, Oriented,  Affect-+calm, Distress- none acute;  Tall, trim Skin- clear Lymphadenopathy- none Head- atraumatic            Eyes- Gross vision intact, PERRLA, conjunctivae clear secretions            Ears- Hearing, canals normal            Nose- Clear, no-Septal dev, mucus, polyps, erosion, perforation             Throat- Mallampati II , mucosa clear , drainage- none, tonsils- atrophic Neck- flexible , trachea midline, no stridor , thyroid nl, carotid no bruit Chest - symmetrical excursion , unlabored           Heart/CV- RRR , no murmur , no gallop  , no rub, nl s1 s2                           - JVD- none , edema- none, stasis changes- none, varices- none           Lung-  clear, unlabored, no cough , dullness-none, rub- none,            Chest wall-  Abd-  Br/ Gen/ Rectal- Not done, not indicated Extrem- cyanosis- none, clubbing, none, atrophy- none, strength- nl Neuro- grossly intact to observation

## 2013-11-19 NOTE — Assessment & Plan Note (Signed)
Unable to maintain continuity of allergy vaccine

## 2013-11-29 ENCOUNTER — Telehealth: Payer: Self-pay | Admitting: Internal Medicine

## 2013-11-29 MED ORDER — PREDNISONE 10 MG PO TABS: ORAL_TABLET | ORAL | Status: DC

## 2013-11-29 NOTE — Telephone Encounter (Signed)
Per OV 11/19/13; Patient Instructions      Continue routine meds Script for tramadol to try for daytime cough You can also use an otc cough med like Delsym in between doses of tramadol if needed Script to try theophylline for breathing and cough. Take this med regularly, 1 twice daily after a meal, with food in your stomach. If it too strong, break it in half and take 1/2 tab twice daily We are stopping allergy shots for now, since you can't get down here easily   Called spoke w/ pt. He increased SOB rest/activity x Thursday. C/o lots of wheezing, chest tx, non prod cough. Denies any PND, no f/c/s/n/v. Using nebs as directed. Please advise Dr. [name] thanks  Allergies  Allergen Reactions  . Azithromycin Rash  . Alvesco [Ciclesonide]     Possible reaction- rash  . Anoro Ellipta [Umeclidinium-Vilanterol]     Possible- rash  . Banana Swelling    Throat swelling   . Penicillins Rash     Current Outpatient Prescriptions on File Prior to Visit  Medication Sig Dispense Refill  . albuterol (PROVENTIL HFA;VENTOLIN HFA) 108 (90 BASE) MCG/ACT inhaler Inhale 2 puffs into the lungs every 4 (four) hours as needed for wheezing or shortness of breath.  1 Inhaler  prn  . albuterol (PROVENTIL) (2.5 MG/3ML) 0.083% nebulizer solution Take 2.5 mg by nebulization every 6 (six) hours as needed for wheezing or shortness of breath.      . chlorpheniramine-HYDROcodone (TUSSIONEX PENNKINETIC ER) 10-8 MG/5ML LQCR Take 5 mLs by mouth every 12 (twelve) hours as needed for cough.  120 mL  0  . EPINEPHrine (EPIPEN) 0.3 mg/0.3 mL SOAJ injection Inject into thigh for severe allergic reaction  1 Device  prn  . mometasone-formoterol (DULERA) 200-5 MCG/ACT AERO Inhale 2 puffs into the lungs 2 (two) times daily.  1 Inhaler  5  . montelukast (SINGULAIR) 10 MG tablet TAKE 1 TABLET BY MOUTH AT BEDTIME  30 tablet  2  . theophylline (THEODUR) 200 MG 12 hr tablet 1 twice daily after meals  60 tablet  5  . traMADol (ULTRAM) 50 MG  tablet 1 every 8 hours if needed for cough  40 tablet  0   No current facility-administered medications on file prior to visit.

## 2013-11-29 NOTE — Telephone Encounter (Signed)
Pt aware of recs. RX sent in. Nothing further needed 

## 2013-11-29 NOTE — Telephone Encounter (Signed)
Unless he sees infection, all we really can do is more prednisone  10 mg tabs, # 100   6 x 2 days, 5 x 2 days, 4 x 2 days, 3 x 2 days, 2 x 2 tays, 1 x 2 days, then stop or as directed

## 2013-12-07 ENCOUNTER — Encounter: Payer: Self-pay | Admitting: Internal Medicine

## 2013-12-10 ENCOUNTER — Ambulatory Visit (INDEPENDENT_AMBULATORY_CARE_PROVIDER_SITE_OTHER): Payer: BC Managed Care – PPO | Admitting: Internal Medicine

## 2013-12-10 ENCOUNTER — Telehealth: Payer: Self-pay | Admitting: Internal Medicine

## 2013-12-10 ENCOUNTER — Encounter: Payer: Self-pay | Admitting: Internal Medicine

## 2013-12-10 VITALS — BP 124/72 | HR 95 | Ht 75.0 in | Wt 139.0 lb

## 2013-12-10 DIAGNOSIS — J4550 Severe persistent asthma, uncomplicated: Secondary | ICD-10-CM

## 2013-12-10 DIAGNOSIS — J320 Chronic maxillary sinusitis: Secondary | ICD-10-CM

## 2013-12-10 DIAGNOSIS — J45909 Unspecified asthma, uncomplicated: Secondary | ICD-10-CM

## 2013-12-10 MED ORDER — FAMOTIDINE 20 MG PO TABS: ORAL_TABLET | ORAL | Status: DC

## 2013-12-10 MED ORDER — PANTOPRAZOLE SODIUM 40 MG PO TBEC: 40.0000 mg | DELAYED_RELEASE_TABLET | Freq: Every day | ORAL | Status: DC

## 2013-12-10 NOTE — Patient Instructions (Signed)
Please see patient coordinator before you leave today  to schedule sinus CT   Work on perfecting  inhaler technique:  relax and gently blow all the way out then take a nice smooth deep breath back in, triggering the inhaler at same time you start breathing in.  Hold for up to 5 seconds if you can.  Rinse and gargle with water when done   Stop theophylline  Pantoprazole (protonix) 40 mg   Take 30-60 min before first meal of the day and Pepcid 20 mg one bedtime until return to office - this is the best way to tell whether stomach acid is contributing to your problem.    GERD (REFLUX)  is an extremely common cause of respiratory symptoms, many times with no significant heartburn at all.    It can be treated with medication, but also with lifestyle changes including avoidance of late meals, excessive alcohol, smoking cessation, and avoid fatty foods, chocolate, peppermint, colas, red wine, and acidic juices such as orange juice.  NO MINT OR MENTHOL PRODUCTS SO NO COUGH DROPS  USE SUGARLESS CANDY INSTEAD (jolley ranchers or Stover's)  NO OIL BASED VITAMINS - use powdered substitutes.  You are cleared to return to work  If you need to start to using your nebulizer on any kind of regular basis you by Dr [name], his nurse practioner Tammy Np, or me.    Reschedule Dr [name] for 4 weeks

## 2013-12-10 NOTE — Assessment & Plan Note (Signed)
See asthma/ poorly controlled

## 2013-12-10 NOTE — Assessment & Plan Note (Addendum)
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 DC'd by pt 2013- resumed 2013> 1:50 GH; stopped 2015 lack of transportation [facility]- Status asthma, no vent. [facility] 3/ 2015- status asthma  I had an extended discussion with the patient today lasting 15 to 20 minutes of a 25 minute visit on the following issues:  The greatest risk of asthma morbidity/ mortality is a h/o status asthmaticus with persistent wheezing even on max rx including high dose steroids and theoph.  DDX of  difficult airways management all start with A and  include Adherence, Ace Inhibitors, Acid Reflux, Active Sinus Disease, Alpha 1 Antitripsin deficiency, Anxiety masquerading as Airways dz,  ABPA,  allergy(esp in young), Aspiration (esp in elderly), Adverse effects of DPI,  Active smokers, plus two Bs  = Bronchiectasis and Beta blocker use..and one C= CHF  Adherence is always the initial "prime suspect" and is a multilayered concern that requires a "trust but verify" approach in every patient - starting with knowing how to use medications, especially inhalers, correctly, keeping up with refills and understanding the fundamental difference between maintenance and prns vs those medications only taken for a very short course and then stopped and not refilled.  The proper method of use, as well as anticipated side effects, of a metered-dose inhaler are discussed and demonstrated to the patient. Improved effectiveness after extensive coaching during this visit to a level of approximately  90% so continue dulera 200 2bid  Allergy > defer to Dr [name], reconsider allergy rx/ xolair at some point - doubt singulair helping but ok to stay on for now  ? Acid (or non-acid) GERD > always difficult to  exclude as up to 75% of pts in some series report no assoc GI/ Heartburn symptoms> rec max (24h)  acid suppression and diet restrictions/ reviewed and instructions given in writing. Since not better on theoph, albeit relatively short trial, will hold for now as can excac gerd.   ? Active sinus dz > sinus CT next   ? Alpha one > already excluded   ? ABPA ? Have we excluded this - somewhat of a moot issue  His "action plan" is to add more saba in multiple forms until he becomes refractory then go to ER - I told him this was not acceptable and I'm certain not what Dr [name] intended.  Will use need for neb to trigger an ov or at least a phone call to Dr [name]  See instructions for specific recommendations which were reviewed directly with the patient who was given a copy with highlighter outlining the key components.

## 2013-12-10 NOTE — Progress Notes (Signed)
Patient ID: Drew Sandoval, male    DOB: Oct 02, 1975    MRN: [id number]  HPI 11/04/10- 37 yobm former smoker followed for severe asthma, chronic sinusitis, hype-reosinophilia and hyper IgE  Sent to Dr [name]  by Dr [name] for allergy evaluation. 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 Allergy profile- 08/11/10 IgE 1,747 with broad specific elevation Last here- August 31, 2010.- note reviewed. Since last here has been in hospital again, getting out 2 weeks ago. Feels well today.  Comes today as planned for allergy skin testing Skin test: Strongly positive especially grass, tree, dust mite  01/04/11- 38 yo former smoker followed for severe asthma, chronic sinusitis, hyper-eosinophilia and hyper IgE.  wife is with him  .Allergy vaccine was started 11/09/2010 and is building without problems He was still on prednisone when he saw Dr [name] recently and at that time he seemed improved after a recent exacerbation- 2 weeks ago. About 2 weeks after their visit he began coughing again. He hasn't noted any unusual exposure at work, or any sense that he had a cold.  His last IgE was 1747.  He doesn't feel "sick"- just tight and wheezy.  02/18/11-  89 yo former smoker followed for severe asthma, chronic sinusitis, hyper-eosinophilia and hyper IgE.  wife is with him Cooler weather is more comfortable for him. He continues to build allergy vaccine, now at 1:500, without problems. Some days continued to be better than others. 2 days ago thought his eyes were starting to itch and took Benadryl. We discussed comparison antihistamines. Not much sneezing. He has not needed his rescue inhaler in a long time but continues prednisone 20 mg daily. We educated on prednisone again.  04/20/11- 38 yo former smoker followed for severe asthma, chronic sinusitis, hyper-eosinophilia and hyper IgE.  wife is with him Went to ER 3 days ago- got neb and prednisone 60 mg x1. He finished  our last taper yesterday. Has had flu shot. Bending over, long walks or climbing stairs, and long work shifts all make him short of breath with wheezing. He worked last night and now feels "65%". He does not think he has an acute cold or infection. He was last really well about 5 days ago. He denies heartburn. He continues building allergy vaccine now at 1:50, without problems. IgE level has been out of range for Xolair.  07/19/11- 38 yo former smoker followed for severe asthma, chronic sinusitis, hyper-eosinophilia and hyper IgE. Separated from wife have now living with his mother. Asthma control is "a whole lot better". He has not missed work. Did have to go to emergency room for kidney stone. Questions allergy vaccine causing "dark spots" on his neck. Using prednisone only occasionally. No routine wheeze, does short of breath at times.  11/18/11- 38 yo former smoker followed for severe asthma, chronic sinusitis, hyper-eosinophilia and hyper IgE. Went to ER for approximately 4 asthma attacks in the past month; He has continued allergy vaccine at 1:50. He is needed his nebulizer 2 or 3 times daily and occasional rescue inhaler. He denies infection or change in workplace exposure. He does not recognize reflux. He usually flares as soon as he finishes the prednisone taper so he and his wife asked about maintenance prednisone which we discussed. Carefully. Sinus congestion tends to parallel his asthma episodes but he denies purulent nasal discharge or headache. We again discussed the significance of his elevated IgE and eosinophil levels. His IgE has been  too high for Xolair but we will reassess. CXR 11/11/11-  IMPRESSION: No active disease. Hyperinflation again noted.  Original Report Authenticated By: Natasha MeadLIVIU POP, M.D.   02/07/12- 38 yo former smoker followed for severe asthma, chronic sinusitis, hyper-eosinophilia and hyper IgE. FOLLOWS FOR: [facility] through  02/04/2012. DC summary reviewed. Discharge diagnosis asthma. Asthma began during the night. He took his own nebulizer, felt better and tried to go to work but had to leave. He had [facility] where he was treated with 3 nebulizer treatments and sent out. He went from [facility]. Discharged on a steroid taper. They comment in the summary that he was significantly depressed because he had watched his father died of asthma. He admits today that he had not told us of that history before. He feels better now but still wheezing and washed out as he tapers prednisone. He denies any sense of reflux or any recognized triggering experience. In the past he had thought there was something associated with his work place as discussed in earlier notes. He comments today that he can't breathe if he sleeps on his left side. There is no history of heart disease. He quit allergy vaccine in April, 2013.          11/19/13- 38 yo former smoker followed for severe asthma, chronic sinusitis, hyper-eosinophilia and hyper IgE- too high for Xolair.  FOLLOWS FOR:  Reports has good and bad days with breathing.  When having bad day, sob, wheezing and cough non-productive.  Hasn't done Allergy vaccine in 3-4 months due to no ride here to have done Hospital admission in March, emergency room visits in May and June because of asthma/cough. He denies any awareness of reflux, adenopathy, post nasal drainage. Cough is dry or scant white sputum. Has cough syrup at night but Tessalon does not help and daytime. He feels tight today. Last prednisone several weeks ago. Easy dyspnea on exertion without chest pain, palpitation, edema. 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 CXR 10/28/13- IMPRESSION:  Hyperinflation, otherwise negative. No change from prior study.  Electronically Signed  rec Continue routine meds Script for  tramadol to try for daytime cough You can also use an otc cough med like Delsym in between doses of tramadol if needed Script to try theophylline for breathing and cough. Take this med regularly, 1 twice daily after a meal, with food in your stomach. If it too strong, break it in half and take 1/2 tab twice daily We are stopping allergy shots for now, since you can't get down here easily.      12/10/2013 f/u ov/Drew Sandoval re:  Severe asthma/ hyper E sp smoking cessation 2011/ 30 mg pred per day  Chief Complaint  Patient presents with  . Follow-up    Pt c/o wheezing with exertion, sometimes prod cough with clear mucus X3 months.  Recently saw CY for this, is being seen today to get a work release note.    breathing fine now and not needing rescue, typical of how he feels on prednisone, no better on theoph in terms of sob or need for saba No cough meds during the day, tramdol not helpful, does fine p tussionex, produces clear mucus each am.   No obvious day to day or daytime variabilty or assoc  cp or chest tightness, subjective wheeze overt sinus or hb symptoms. No  unusual exp hx or h/o childhood pna/ asthma or knowledge of premature birth.  Sleeping ok p tussionex without nocturnal  or early am exacerbation  of respiratory  c/o's or need for noct saba. Also denies any obvious fluctuation of symptoms with weather or environmental changes or other aggravating or alleviating factors except as outlined above   Current Medications, Allergies, Complete Past Medical History, Past Surgical History, Family History, and Social History were reviewed in [employer] record.  ROS  The following are not active complaints unless bolded sore throat, dysphagia, dental problems, itching, sneezing,  nasal congestion or excess/ purulent secretions, ear ache,   fever, chills, sweats, unintended wt loss, pleuritic or exertional cp, hemoptysis,  orthopnea pnd or leg swelling, presyncope, palpitations,  heartburn, abdominal pain, anorexia, nausea, vomiting, diarrhea  or change in bowel or urinary habits, change in stools or urine, dysuria,hematuria,  rash, arthralgias, visual complaints, headache, numbness weakness or ataxia or problems with walking or coordination,  change in mood/affect or memory.            Objective:   Physical Exam General- Alert, Oriented, Affect-+calm, Distress- none acute;  Tall, trim Skin- clear Lymphadenopathy- none Head- atraumatic            Eyes- Gross vision intact, PERRLA, conjunctivae clear secretions            Ears- Hearing, canals normal            Nose- Clear, no-Septal dev, mucus, polyps, erosion, perforation             Throat- Mallampati II , mucosa clear , drainage- none, tonsils- atrophic Neck- flexible , trachea midline, no stridor , thyroid nl, carotid no bruit Chest - symmetrical excursion , unlabored           Heart/CV- RRR , no murmur , no gallop  , no rub, nl s1 s2                           - JVD- none , edema- none, stasis changes- none, varices- none           Lung-  clear, unlabored, no cough , dullness-none, rub- none,                         Mid exp wheeze bilaterally        Abd- soft and benign  Br/ Gen/ Rectal- Not done, not indicated Extrem- cyanosis- none, clubbing, none, atrophy- none, strength- nl Neuro- grossly intact to observation

## 2013-12-10 NOTE — Telephone Encounter (Signed)
Spoke with pt. He reports he missed work d/t his asthma. He is scheduled to come in and see MW this afternoon at 4:30. Nothing further needed

## 2013-12-19 ENCOUNTER — Ambulatory Visit (HOSPITAL_COMMUNITY): Payer: BC Managed Care – PPO

## 2014-01-08 ENCOUNTER — Telehealth: Payer: Self-pay | Admitting: Internal Medicine

## 2014-01-08 ENCOUNTER — Encounter: Payer: Self-pay | Admitting: *Deleted

## 2014-01-08 NOTE — Telephone Encounter (Signed)
Pt will pick up letter tomorrow. Nothing further needed

## 2014-01-08 NOTE — Telephone Encounter (Signed)
So he needs work note LOA today, 9/1, and may return unrestricted 9/2- Ok

## 2014-01-08 NOTE — Telephone Encounter (Signed)
Called spoke with pt. He reports he has been running fever of 100 and problems with his asthma. He reports he works 3rd shift. He has not worked since yesterday. He took tonight off per pt d/t his symptoms. He wants a work note stating he can return tomorrow w/o restrictions. Please advise CDY thanks

## 2014-01-09 ENCOUNTER — Encounter: Payer: Self-pay | Admitting: *Deleted

## 2014-01-11 ENCOUNTER — Ambulatory Visit: Payer: BC Managed Care – PPO | Admitting: Internal Medicine

## 2014-01-24 ENCOUNTER — Encounter: Payer: Self-pay | Admitting: Internal Medicine

## 2014-01-24 ENCOUNTER — Ambulatory Visit: Payer: BC Managed Care – PPO | Admitting: Internal Medicine

## 2014-01-24 VITALS — BP 120/58 | HR 84 | Ht 75.0 in | Wt 146.0 lb

## 2014-01-24 DIAGNOSIS — J45909 Unspecified asthma, uncomplicated: Secondary | ICD-10-CM

## 2014-01-24 NOTE — Progress Notes (Signed)
Patient ID: Drew PavlovMarshall Sandoval, male    DOB: Oct 02, 1975    MRN: [id number]  HPI 11/04/10- 37 yobm former smoker followed for severe asthma, chronic sinusitis, hype-reosinophilia and hyper IgE  Sent to Dr [name]  by Dr [name] for allergy evaluation. 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 Allergy profile- 08/11/10 IgE 1,747 with broad specific elevation Last here- August 31, 2010.- note reviewed. Since last here has been in hospital again, getting out 2 weeks ago. Feels well today.  Comes today as planned for allergy skin testing Skin test: Strongly positive especially grass, tree, dust mite  01/04/11- 38 yo former smoker followed for severe asthma, chronic sinusitis, hyper-eosinophilia and hyper IgE.  wife is with him  .Allergy vaccine was started 11/09/2010 and is building without problems He was still on prednisone when he saw Dr [name] recently and at that time he seemed improved after a recent exacerbation- 2 weeks ago. About 2 weeks after their visit he began coughing again. He hasn't noted any unusual exposure at work, or any sense that he had a cold.  His last IgE was 1747.  He doesn't feel "sick"- just tight and wheezy.  02/18/11-  38 yo former smoker followed for severe asthma, chronic sinusitis, hyper-eosinophilia and hyper IgE.  wife is with him Cooler weather is more comfortable for him. He continues to build allergy vaccine, now at 1:500, without problems. Some days continued to be better than others. 2 days ago thought his eyes were starting to itch and took Benadryl. We discussed comparison antihistamines. Not much sneezing. He has not needed his rescue inhaler in a long time but continues prednisone 20 mg daily. We educated on prednisone again.  04/20/11- 38 yo former smoker followed for severe asthma, chronic sinusitis, hyper-eosinophilia and hyper IgE.  wife is with him Went to ER 3 days ago- got neb and prednisone 60 mg x1. He finished  our last taper yesterday. Has had flu shot. Bending over, long walks or climbing stairs, and long work shifts all make him short of breath with wheezing. He worked last night and now feels "65%". He does not think he has an acute cold or infection. He was last really well about 5 days ago. He denies heartburn. He continues building allergy vaccine now at 1:50, without problems. IgE level has been out of range for Xolair.  07/19/11- 38 yo former smoker followed for severe asthma, chronic sinusitis, hyper-eosinophilia and hyper IgE. Separated from wife have now living with his mother. Asthma control is "a whole lot better". He has not missed work. Did have to go to emergency room for kidney stone. Questions allergy vaccine causing "dark spots" on his neck. Using prednisone only occasionally. No routine wheeze, does short of breath at times.  11/18/11- 38 yo former smoker followed for severe asthma, chronic sinusitis, hyper-eosinophilia and hyper IgE. Went to ER for approximately 4 asthma attacks in the past month; He has continued allergy vaccine at 1:50. He is needed his nebulizer 2 or 3 times daily and occasional rescue inhaler. He denies infection or change in workplace exposure. He does not recognize reflux. He usually flares as soon as he finishes the prednisone taper so he and his wife asked about maintenance prednisone which we discussed. Carefully. Sinus congestion tends to parallel his asthma episodes but he denies purulent nasal discharge or headache. We again discussed the significance of his elevated IgE and eosinophil levels. His IgE has been  too high for Xolair but we will reassess. CXR 11/11/11-  IMPRESSION: No active disease. Hyperinflation again noted.  Original Report Authenticated By: Natasha Mead, M.D.   02/07/12- 38 yo former smoker followed for severe asthma, chronic sinusitis, hyper-eosinophilia and hyper IgE. FOLLOWS FOR: post hospital at [facility] 924 through  02/04/2012. DC summary reviewed. Discharge diagnosis asthma. Asthma began during the night. He took his own nebulizer, felt better and tried to go to work but had to leave. He had presented to [facility] ER where he was treated with 3 nebulizer treatments and sent out. He went from there to the [facility] emergency room. Discharged on a steroid taper. They comment in the summary that he was significantly depressed because he had watched his father died of asthma. He admits today that he had not told us of that history before. He feels better now but still wheezing and washed out as he tapers prednisone. He denies any sense of reflux or any recognized triggering experience. In the past he had thought there was something associated with his work place as discussed in earlier notes. He comments today that he can't breathe if he sleeps on his left side. There is no history of heart disease. He quit allergy vaccine in April, 2013.          11/19/13- 38 yo former smoker followed for severe asthma, chronic sinusitis, hyper-eosinophilia and hyper IgE- too high for Xolair.  FOLLOWS FOR:  Reports has good and bad days with breathing.  When having bad day, sob, wheezing and cough non-productive.  Hasn't done Allergy vaccine in 3-4 months due to no ride here to have done Hospital admission in March, emergency room visits in May and June because of asthma/cough. He denies any awareness of reflux, adenopathy, post nasal drainage. Cough is dry or scant white sputum. Has cough syrup at night but Tessalon does not help and daytime. He feels tight today. Last prednisone several weeks ago. Easy dyspnea on exertion without chest pain, palpitation, edema. 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 CXR 10/28/13- IMPRESSION:  Hyperinflation, otherwise negative. No change from prior study.  Electronically Signed  rec Continue routine meds Script for  tramadol to try for daytime cough You can also use an otc cough med like Delsym in between doses of tramadol if needed Script to try theophylline for breathing and cough. Take this med regularly, 1 twice daily after a meal, with food in your stomach. If it too strong, break it in half and take 1/2 tab twice daily We are stopping allergy shots for now, since you can't get down here easily.   12/10/2013 f/u ov/Wert re:  Severe asthma/ hyper E sp smoking cessation 2011/ 30 mg pred per day  Chief Complaint  Patient presents with  . Follow-up    Pt c/o wheezing with exertion, sometimes prod cough with clear mucus X3 months.  Recently saw CY for this, is being seen today to get a work release note.    breathing fine now and not needing rescue, typical of how he feels on prednisone, no better on theoph in terms of sob or need for saba No cough meds during the day, tramdol not helpful, does fine p tussionex, produces clear mucus each am.  No obvious day to day or daytime variabilty or assoc  cp or chest tightness, subjective wheeze overt sinus or hb symptoms. No unusual exp hx or  h/o childhood pna/ asthma or knowledge of premature birth. Sleeping ok p tussionex without nocturnal  or early am exacerbation  of respiratory  c/o's or need for noct saba. Also denies any obvious fluctuation of symptoms with weather or environmental changes or other aggravating or alleviating factors except as outlined above   01/24/14- 38 yoM former smoker followed for severe asthma, chronic sinusitis, hyper-eosinophilia and hyper IgE- too high for Xolair.  FOLLOWS FOR: Pt last seen by MW on 12/10/13. Pt has stopped with his vaccine for 1 year but wants to start up again d/t itchy eyes, runny nose, prod cough. Pt denies SOB and CP/tightness.      Objective:   Physical Exam General- Alert, Oriented, Affect-+calm, Distress- none acute;  Tall, trim Skin- clear Lymphadenopathy- none Head- atraumatic            Eyes- Gross vision  intact, PERRLA, conjunctivae clear secretions            Ears- Hearing, canals normal            Nose- Clear, no-Septal dev, mucus, polyps, erosion, perforation             Throat- Mallampati II , mucosa clear , drainage- none, tonsils- atrophic Neck- flexible , trachea midline, no stridor , thyroid nl, carotid no bruit Chest - symmetrical excursion , unlabored           Heart/CV- RRR , no murmur , no gallop  , no rub, nl s1 s2                           - JVD- none , edema- none, stasis changes- none, varices- none           Lung-  clear, unlabored, no cough , dullness-none, rub- none,                         Mid exp wheeze bilaterally        Abd- soft and benign  Br/ Gen/ Rectal- Not done, not indicated Extrem- cyanosis- none, clubbing, none, atrophy- none, strength- nl Neuro- grossly intact to observation

## 2014-01-24 NOTE — Patient Instructions (Addendum)
We can continue current asthma meds  For the allergic nose  Try otc Flonase/ fluticasone nasal spray    1-2 puffs each nostril once daily at bedtime                                     And      Allegra 180/ fexofenadine antihistamine tabs    1 daily while needed  Please call if we can help  Flu vax

## 2014-02-04 ENCOUNTER — Encounter: Payer: Self-pay | Admitting: *Deleted

## 2014-02-04 ENCOUNTER — Telehealth: Payer: Self-pay | Admitting: Internal Medicine

## 2014-02-05 NOTE — Telephone Encounter (Signed)
Per Florentina AddisonKatie, letter was given to pt. Nothing further needed.

## 2014-02-21 ENCOUNTER — Telehealth: Payer: Self-pay | Admitting: Internal Medicine

## 2014-02-21 MED ORDER — PREDNISONE 10 MG PO TABS: ORAL_TABLET | ORAL | Status: DC

## 2014-02-21 NOTE — Telephone Encounter (Signed)
Offer prednisone 10 mg, # 20, 4 X 2 DAYS, 3 X 2 DAYS, 2 X 2 DAYS, 1 X 2 DAYS  

## 2014-02-21 NOTE — Telephone Encounter (Signed)
Called and spoke with pt and he stated that he has been cough with off white sputum, SOB/wheezing and sore throat  That started on Monday.  Pt denies any fever, chills, sweats, body aches.  Pt stated that his nebulizer is not helping him as much.  CY please advise. Thanks  Last ov--01/24/2014 Next ov--03/22/2014  Allergies  Allergen Reactions  . Azithromycin Rash  . Alvesco [Ciclesonide]     Possible reaction- rash  . Anoro Ellipta [Umeclidinium-Vilanterol]     Possible- rash  . Banana Swelling    Throat swelling   . Penicillins Rash    Current Outpatient Prescriptions on File Prior to Visit  Medication Sig Dispense Refill  . albuterol (PROVENTIL HFA;VENTOLIN HFA) 108 (90 BASE) MCG/ACT inhaler Inhale 2 puffs into the lungs every 4 (four) hours as needed for wheezing or shortness of breath.  1 Inhaler  prn  . albuterol (PROVENTIL) (2.5 MG/3ML) 0.083% nebulizer solution Take 2.5 mg by nebulization every 6 (six) hours as needed for wheezing or shortness of breath.      . EPINEPHrine (EPIPEN) 0.3 mg/0.3 mL SOAJ injection Inject into thigh for severe allergic reaction  1 Device  prn  . famotidine (PEPCID) 20 MG tablet One at bedtime  30 tablet  11  . mometasone-formoterol (DULERA) 200-5 MCG/ACT AERO Inhale 2 puffs into the lungs 2 (two) times daily.  1 Inhaler  5  . montelukast (SINGULAIR) 10 MG tablet TAKE 1 TABLET BY MOUTH AT BEDTIME  30 tablet  2  . traMADol (ULTRAM) 50 MG tablet 1 every 8 hours if needed for cough  40 tablet  0   No current facility-administered medications on file prior to visit.

## 2014-02-21 NOTE — Telephone Encounter (Signed)
Called spoke with patient and discussed CY's recommendations Pt okay with the prednisone Rx sent to verified pharmacy Pt aware to call the office if his symptoms do not improve or worsen  Nothing further needed at this time, will sign off

## 2014-02-26 ENCOUNTER — Telehealth: Payer: Self-pay | Admitting: Internal Medicine

## 2014-02-26 ENCOUNTER — Encounter: Payer: Self-pay | Admitting: *Deleted

## 2014-02-26 NOTE — Telephone Encounter (Signed)
Spoke with the pt  He states that he had a hard time with his breathing yesterday and did not go to work  He now needs a note to return to work today with no restrictions  [facility] also [facility]  Please advise, thanks!

## 2014-02-26 NOTE — Telephone Encounter (Signed)
Per CDY- okay to provide letter and give sample of symbicort 160/4.5  to try out  Letter and sample given  I advised take 2 puffs and rinse mouth am and pm and call to let us know how it's working  Pt verbalized understanding  Nothing further needed

## 2014-03-21 ENCOUNTER — Telehealth: Payer: Self-pay | Admitting: Internal Medicine

## 2014-03-21 ENCOUNTER — Encounter: Payer: Self-pay | Admitting: Internal Medicine

## 2014-03-21 MED ORDER — PREDNISONE 10 MG PO TABS: ORAL_TABLET | ORAL | Status: DC

## 2014-03-21 NOTE — Telephone Encounter (Signed)
Called pt. He has appt tomorrow AM w/ CDY. He reports his chest is very tight, feeling SOB, wheezing, dry cough.  Denies any nasal congestion, no PND, no f/c/s/n/v.  Pt has used rescue inhaler 3 times and from my understanding he has used his albuterol neb 4 times today Can something be called in until appt in AM? Please advise thanks  Allergies  Allergen Reactions  . Azithromycin Rash  . Alvesco [Ciclesonide]     Possible reaction- rash  . Anoro Ellipta [Umeclidinium-Vilanterol]     Possible- rash  . Banana Swelling    Throat swelling   . Penicillins Rash     Current Outpatient Prescriptions on File Prior to Visit  Medication Sig Dispense Refill  . albuterol (PROVENTIL HFA;VENTOLIN HFA) 108 (90 BASE) MCG/ACT inhaler Inhale 2 puffs into the lungs every 4 (four) hours as needed for wheezing or shortness of breath. 1 Inhaler prn  . albuterol (PROVENTIL) (2.5 MG/3ML) 0.083% nebulizer solution Take 2.5 mg by nebulization every 6 (six) hours as needed for wheezing or shortness of breath.    . EPINEPHrine (EPIPEN) 0.3 mg/0.3 mL SOAJ injection Inject into thigh for severe allergic reaction 1 Device prn  . famotidine (PEPCID) 20 MG tablet One at bedtime 30 tablet 11  . mometasone-formoterol (DULERA) 200-5 MCG/ACT AERO Inhale 2 puffs into the lungs 2 (two) times daily. 1 Inhaler 5  . montelukast (SINGULAIR) 10 MG tablet TAKE 1 TABLET BY MOUTH AT BEDTIME 30 tablet 2  . predniSONE (DELTASONE) 10 MG tablet 4 X 2 DAYS, 3 X 2 DAYS, 2 X 2 DAYS, 1 X 2 DAYS 20 tablet 0  . traMADol (ULTRAM) 50 MG tablet 1 every 8 hours if needed for cough 40 tablet 0   No current facility-administered medications on file prior to visit.

## 2014-03-21 NOTE — Telephone Encounter (Signed)
I called spoke with pt. Aware of recs. RX sent in. Nothing further needed

## 2014-03-21 NOTE — Telephone Encounter (Signed)
Start prednisone taper 10 mg, # 20, 4 X 2 DAYS, 3 X 2 DAYS, 2 X 2 DAYS, 1 X 2 DAYS

## 2014-03-21 NOTE — Telephone Encounter (Signed)
Error

## 2014-03-22 ENCOUNTER — Encounter: Payer: Self-pay | Admitting: Internal Medicine

## 2014-03-22 ENCOUNTER — Ambulatory Visit (INDEPENDENT_AMBULATORY_CARE_PROVIDER_SITE_OTHER): Payer: BC Managed Care – PPO | Admitting: Internal Medicine

## 2014-03-22 ENCOUNTER — Encounter (INDEPENDENT_AMBULATORY_CARE_PROVIDER_SITE_OTHER): Payer: Self-pay

## 2014-03-22 VITALS — BP 108/62 | HR 71 | Ht 75.0 in | Wt 146.8 lb

## 2014-03-22 DIAGNOSIS — J4551 Severe persistent asthma with (acute) exacerbation: Secondary | ICD-10-CM

## 2014-03-22 MED ORDER — ALBUTEROL SULFATE (2.5 MG/3ML) 0.083% IN NEBU: 2.5000 mg | INHALATION_SOLUTION | Freq: Four times a day (QID) | RESPIRATORY_TRACT | Status: DC | PRN

## 2014-03-22 MED ORDER — MONTELUKAST SODIUM 10 MG PO TABS: ORAL_TABLET | ORAL | Status: DC

## 2014-03-22 MED ORDER — BUDESONIDE-FORMOTEROL FUMARATE 160-4.5 MCG/ACT IN AERO: INHALATION_SPRAY | RESPIRATORY_TRACT | Status: DC

## 2014-03-22 NOTE — Patient Instructions (Signed)
Script for Symbicort sent to your drug store (? Coupon)  Use this instead of Dyulera.  Finish the prednisone taper.  Please call us earlier- don't wait till you get in real trouble, because then you may have to go to ER for help.

## 2014-03-22 NOTE — Assessment & Plan Note (Signed)
He knows what to do and what medicines to use, but he lets himself drift into trouble before he seeks help. Education done again reviewed Plan-at his request change Dulera back to Symbicort 160 with discussion

## 2014-03-22 NOTE — Progress Notes (Signed)
Patient ID: Drew Sandoval, male    DOB: Oct 02, 1975    MRN: [id number]  HPI 11/04/10- 37 yobm former smoker followed for severe asthma, chronic sinusitis, hype-reosinophilia and hyper IgE  Sent to Dr [name]  by Dr [name] for allergy evaluation. 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 Allergy profile- 08/11/10 IgE 1,747 with broad specific elevation Last here- August 31, 2010.- note reviewed. Since last here has been in hospital again, getting out 2 weeks ago. Feels well today.  Comes today as planned for allergy skin testing Skin test: Strongly positive especially grass, tree, dust mite  01/04/11- 38 yo former smoker followed for severe asthma, chronic sinusitis, hyper-eosinophilia and hyper IgE.  wife is with him  .Allergy vaccine was started 11/09/2010 and is building without problems He was still on prednisone when he saw Dr [name] recently and at that time he seemed improved after a recent exacerbation- 2 weeks ago. About 2 weeks after their visit he began coughing again. He hasn't noted any unusual exposure at work, or any sense that he had a cold.  His last IgE was 1747.  He doesn't feel "sick"- just tight and wheezy.  02/18/11-  89 yo former smoker followed for severe asthma, chronic sinusitis, hyper-eosinophilia and hyper IgE.  wife is with him Cooler weather is more comfortable for him. He continues to build allergy vaccine, now at 1:500, without problems. Some days continued to be better than others. 2 days ago thought his eyes were starting to itch and took Benadryl. We discussed comparison antihistamines. Not much sneezing. He has not needed his rescue inhaler in a long time but continues prednisone 20 mg daily. We educated on prednisone again.  04/20/11- 38 yo former smoker followed for severe asthma, chronic sinusitis, hyper-eosinophilia and hyper IgE.  wife is with him Went to ER 3 days ago- got neb and prednisone 60 mg x1. He finished  our last taper yesterday. Has had flu shot. Bending over, long walks or climbing stairs, and long work shifts all make him short of breath with wheezing. He worked last night and now feels "65%". He does not think he has an acute cold or infection. He was last really well about 5 days ago. He denies heartburn. He continues building allergy vaccine now at 1:50, without problems. IgE level has been out of range for Xolair.  07/19/11- 38 yo former smoker followed for severe asthma, chronic sinusitis, hyper-eosinophilia and hyper IgE. Separated from wife have now living with his mother. Asthma control is "a whole lot better". He has not missed work. Did have to go to emergency room for kidney stone. Questions allergy vaccine causing "dark spots" on his neck. Using prednisone only occasionally. No routine wheeze, does short of breath at times.  11/18/11- 38 yo former smoker followed for severe asthma, chronic sinusitis, hyper-eosinophilia and hyper IgE. Went to ER for approximately 4 asthma attacks in the past month; He has continued allergy vaccine at 1:50. He is needed his nebulizer 2 or 3 times daily and occasional rescue inhaler. He denies infection or change in workplace exposure. He does not recognize reflux. He usually flares as soon as he finishes the prednisone taper so he and his wife asked about maintenance prednisone which we discussed. Carefully. Sinus congestion tends to parallel his asthma episodes but he denies purulent nasal discharge or headache. We again discussed the significance of his elevated IgE and eosinophil levels. His IgE has been  too high for Xolair but we will reassess. CXR 11/11/11-  IMPRESSION: No active disease. Hyperinflation again noted.  Original Report Authenticated By: Natasha Mead, M.D.   02/07/12- 31 yo former smoker followed for severe asthma, chronic sinusitis, hyper-eosinophilia and hyper IgE. FOLLOWS FOR: post hospital at [facility] 924 through  02/04/2012. DC summary reviewed. Discharge diagnosis asthma. Asthma began during the night. He took his own nebulizer, felt better and tried to go to work but had to leave. He had presented to [facility] ER where he was treated with 3 nebulizer treatments and sent out. He went from there to the [facility] emergency room. Discharged on a steroid taper. They comment in the summary that he was significantly depressed because he had watched his father died of asthma. He admits today that he had not told us of that history before. He feels better now but still wheezing and washed out as he tapers prednisone. He denies any sense of reflux or any recognized triggering experience. In the past he had thought there was something associated with his work place as discussed in earlier notes. He comments today that he can't breathe if he sleeps on his left side. There is no history of heart disease. He quit allergy vaccine in April, 2013.          11/19/13- 37 yo former smoker followed for severe asthma, chronic sinusitis, hyper-eosinophilia and hyper IgE- too high for Xolair.  FOLLOWS FOR:  Reports has good and bad days with breathing.  When having bad day, sob, wheezing and cough non-productive.  Hasn't done Allergy vaccine in 3-4 months due to no ride here to have done Hospital admission in March, emergency room visits in May and June because of asthma/cough. He denies any awareness of reflux, adenopathy, post nasal drainage. Cough is dry or scant white sputum. Has cough syrup at night but Tessalon does not help and daytime. He feels tight today. Last prednisone several weeks ago. Easy dyspnea on exertion without chest pain, palpitation, edema. 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 CXR 10/28/13- IMPRESSION:  Hyperinflation, otherwise negative. No change from prior study.  Electronically Signed  rec Continue routine meds Script for  tramadol to try for daytime cough You can also use an otc cough med like Delsym in between doses of tramadol if needed Script to try theophylline for breathing and cough. Take this med regularly, 1 twice daily after a meal, with food in your stomach. If it too strong, break it in half and take 1/2 tab twice daily We are stopping allergy shots for now, since you can't get down here easily.   12/10/2013 f/u ov/Wert re:  Severe asthma/ hyper E sp smoking cessation 2011/ 30 mg pred per day  Chief Complaint  Patient presents with  . Follow-up    Pt c/o wheezing with exertion, sometimes prod cough with clear mucus X3 months.  Recently saw CY for this, is being seen today to get a work release note.    breathing fine now and not needing rescue, typical of how he feels on prednisone, no better on theoph in terms of sob or need for saba No cough meds during the day, tramdol not helpful, does fine p tussionex, produces clear mucus each am.  No obvious day to day or daytime variabilty or assoc  cp or chest tightness, subjective wheeze overt sinus or hb symptoms. No unusual exp hx or  h/o childhood pna/ asthma or knowledge of premature birth. Sleeping ok p tussionex without nocturnal  or early am exacerbation  of respiratory  c/o's or need for noct saba. Also denies any obvious fluctuation of symptoms with weather or environmental changes or other aggravating or alleviating factors except as outlined above   01/24/14- 38 yoM former smoker followed for severe asthma, chronic sinusitis, hyper-eosinophilia and hyper IgE- too high for Xolair.  FOLLOWS FOR: Pt last seen by MW on 12/10/13. Pt has stopped with his vaccine for 1 year but wants to start up again d/t itchy eyes, runny nose, prod cough. Pt denies SOB and CP/tightness.   03/22/14- [age] yoM former smoker followed for severe asthma, chronic sinusitis, hyper-eosinophilia and hyper IgE- too high for Xolair FOLLOWS FOR: had attack yesterday. Pt states he lets his  breathing get bad. Like Symbicort better than Dulera and needs RX.We called in Pred taper yesterday.  Had flu vaccine. 3 weeks ago asthma began getting bad and really flared yesterday. Blames smell of burning leaves outdoors. Doesn't think he has a cold. Admits he let himself get out of hand before calling. Started prednisone last night and today feels much better.  Objective:   Physical Exam General- Alert, Oriented, Affect-+calm, Distress- none acute;  Tall, trim Skin- clear Lymphadenopathy- none Head- atraumatic            Eyes- Gross vision intact, PERRLA, conjunctivae clear secretions            Ears- Hearing, canals normal            Nose- Clear, no-Septal dev, mucus, polyps, erosion, perforation             Throat- Mallampati II , mucosa clear , drainage- none, tonsils- atrophic Neck- flexible , trachea midline, no stridor , thyroid nl, carotid no bruit Chest - symmetrical excursion , unlabored           Heart/CV- RRR , no murmur , no gallop  , no rub, nl s1 s2                           - JVD- none , edema- none, stasis changes- none, varices- none           Lung-  Clear/ diminished, unlabored, no cough , dullness-none, rub- none,                         Wheeze-none       Abd- soft and benign  Br/ Gen/ Rectal- Not done, not indicated Extrem- cyanosis- none, clubbing, none, atrophy- none, strength- nl Neuro- grossly intact to observation

## 2014-04-01 ENCOUNTER — Encounter: Payer: Self-pay | Admitting: *Deleted

## 2014-04-01 ENCOUNTER — Telehealth: Payer: Self-pay | Admitting: Internal Medicine

## 2014-04-01 NOTE — Telephone Encounter (Signed)
I spoke with CY-he has agreed to give patient a work note. This has been created, signed, and given to patient. Nothing more needed at this time.

## 2014-04-16 ENCOUNTER — Telehealth: Payer: Self-pay | Admitting: Internal Medicine

## 2014-04-16 MED ORDER — PREDNISONE 10 MG PO TABS: ORAL_TABLET | ORAL | Status: DC

## 2014-04-16 NOTE — Telephone Encounter (Signed)
Offer prednisone 10 mg, # 20, 4 X 2 DAYS, 3 X 2 DAYS, 2 X 2 DAYS, 1 X 2 DAYS  

## 2014-04-16 NOTE — Telephone Encounter (Signed)
Called and spoke to pt. Informed pt of the pred script. Rx sent to preferred pharmacy. Pt verbalized understanding and denied any further questions or concerns at this time.

## 2014-04-16 NOTE — Telephone Encounter (Signed)
Spoke with patient-he state he is having a "bad" asthma flare up and thought about going to the hospital but wanted to get recs from CY and possible prednisone Rx first. Pt did not sound bad on phone when speaking with him(he has sounded worse in the past).   CY please advise. Thanks.

## 2014-04-29 ENCOUNTER — Telehealth: Payer: Self-pay | Admitting: Internal Medicine

## 2014-04-29 MED ORDER — ALBUTEROL SULFATE HFA 108 (90 BASE) MCG/ACT IN AERS: 2.0000 | INHALATION_SPRAY | RESPIRATORY_TRACT | Status: DC | PRN

## 2014-04-29 NOTE — Telephone Encounter (Signed)
Pt calling to see why no one called him back about refill on inhaler.Caren GriffinsStanley A Dalton

## 2014-04-29 NOTE — Telephone Encounter (Signed)
Pt made aware that Rx has been sent to pharmacy as requested.

## 2014-04-29 NOTE — Telephone Encounter (Signed)
lmtcb for pt.  

## 2014-05-13 ENCOUNTER — Telehealth: Payer: Self-pay | Admitting: Internal Medicine

## 2014-05-13 MED ORDER — METHYLPREDNISOLONE 8 MG PO TABS: ORAL_TABLET | ORAL | Status: DC

## 2014-05-13 MED ORDER — ALBUTEROL SULFATE (2.5 MG/3ML) 0.083% IN NEBU: 2.5000 mg | INHALATION_SOLUTION | Freq: Four times a day (QID) | RESPIRATORY_TRACT | Status: DC | PRN

## 2014-05-13 NOTE — Telephone Encounter (Signed)
Called and spoke to pt. Pt c/o increase in SOB, dry cough, chest tightness and insomnia d/t cough x 2 weeks. Pt stated the pred that was previously scripted, on 04/16/14, didn't help much. Pt stated he has used all of his albuterol neb solution and is now requesting more- last filled on 03/22/2014 with 5 refills. Pt denies f/c/s, swelling. Pt last seen on 03/22/14 by CY.   Dr. Maple Hudson please advise.   Allergies  Allergen Reactions  . Azithromycin Rash  . Alvesco [Ciclesonide]     Possible reaction- rash  . Anoro Ellipta [Umeclidinium-Vilanterol]     Possible- rash  . Banana Swelling    Throat swelling   . Penicillins Rash    Current Outpatient Prescriptions on File Prior to Visit  Medication Sig Dispense Refill  . albuterol (PROVENTIL HFA;VENTOLIN HFA) 108 (90 BASE) MCG/ACT inhaler Inhale 2 puffs into the lungs every 4 (four) hours as needed for wheezing or shortness of breath. 1 Inhaler prn  . albuterol (PROVENTIL) (2.5 MG/3ML) 0.083% nebulizer solution Take 3 mLs (2.5 mg total) by nebulization every 6 (six) hours as needed for wheezing or shortness of breath. 75 mL 5  . budesonide-formoterol (SYMBICORT) 160-4.5 MCG/ACT inhaler 2 puffs twice daily, then rinse mouth 1 Inhaler 6  . EPINEPHrine (EPIPEN) 0.3 mg/0.3 mL SOAJ injection Inject into thigh for severe allergic reaction 1 Device prn  . famotidine (PEPCID) 20 MG tablet One at bedtime 30 tablet 11  . montelukast (SINGULAIR) 10 MG tablet TAKE 1 TABLET BY MOUTH AT BEDTIME 30 tablet 5  . predniSONE (DELTASONE) 10 MG tablet     . predniSONE (DELTASONE) 10 MG tablet Take 4 tab for 2 days, 3 tab for 2 days, 2 tab for 2 tabs, 1 tab for 2 days and then stop 20 tablet 0  . traMADol (ULTRAM) 50 MG tablet 1 every 8 hours if needed for cough 40 tablet 0   No current facility-administered medications on file prior to visit.

## 2014-05-13 NOTE — Telephone Encounter (Signed)
Medrol rx printed.  It was faxed to [facility] at [phone].

## 2014-05-13 NOTE — Telephone Encounter (Signed)
Ok to refill his neb solution  Offer script medrol 8 mg,  6 x 2 days, 5 x 2 days, 4 X 2 DAYS, 3 X 2 DAYS, 2 X 2 DAYS, 1 X 2 DAYS   # 42

## 2014-05-13 NOTE — Telephone Encounter (Signed)
Called, spoke wit pt.  Discussed below recs per Dr. Maple Hudson.  Pt verbalized understanding, is aware albuterol neb and medrol rxs sent to [facility], and is to call office back or seek emergency care if needed if symptoms do not improve or worsen.  Pt in agreement with plan and voiced no further questions or concerns at this time.

## 2014-06-05 ENCOUNTER — Ambulatory Visit: Payer: BC Managed Care – PPO | Admitting: Internal Medicine

## 2014-06-24 ENCOUNTER — Telehealth: Payer: Self-pay | Admitting: Internal Medicine

## 2014-06-24 MED ORDER — PREDNISONE 10 MG PO TABS: ORAL_TABLET | ORAL | Status: DC

## 2014-06-24 NOTE — Telephone Encounter (Signed)
Spoke with pt regarding below recs per Dr. [name].  He verbalized understanding and is aware [facility] [place].  Pt is to call back if symptoms do not improve or worsen and is to seek emergency care if needed.  He verbalized understanding, is in agreement with plan, and voiced no further questions or concerns at this time.

## 2014-06-24 NOTE — Telephone Encounter (Signed)
Pt c/o increased SOB, chest tightness and cough with clear-yellow mucus at times.  Using Symbicort as directed. Has been using Albuterol nebulizer, not working to control symptoms.  [facility]ing [facility] [place]   Please advise Dr [name]. Thanks. Allergies  Allergen Reactions  . Azithromycin Rash  . Alvesco [Ciclesonide]     Possible reaction- rash  . Anoro Ellipta [Umeclidinium-Vilanterol]     Possible- rash  . Banana Swelling    Throat swelling   . Penicillins Rash     Medication List       This list is accurate as of: 06/24/14 10:58 AM.  Always use your most recent med list.               albuterol 108 (90 BASE) MCG/ACT inhaler  Commonly known as:  PROVENTIL HFA;VENTOLIN HFA  Inhale 2 puffs into the lungs every 4 (four) hours as needed for wheezing or shortness of breath.     albuterol (2.5 MG/3ML) 0.083% nebulizer solution  Commonly known as:  PROVENTIL  Take 3 mLs (2.5 mg total) by nebulization every 6 (six) hours as needed for wheezing or shortness of breath.     budesonide-formoterol 160-4.5 MCG/ACT inhaler  Commonly known as:  SYMBICORT  2 puffs twice daily, then rinse mouth     EPINEPHrine 0.3 mg/0.3 mL Soaj injection  Commonly known as:  EPIPEN  Inject into thigh for severe allergic reaction     famotidine 20 MG tablet  Commonly known as:  PEPCID  One at bedtime     montelukast 10 MG tablet  Commonly known as:  SINGULAIR  TAKE 1 TABLET BY MOUTH AT BEDTIME     traMADol 50 MG tablet  Commonly known as:  ULTRAM  1 every 8 hours if needed for cough

## 2014-06-24 NOTE — Telephone Encounter (Signed)
Offer prednisone 10 mg, # 20, 4 X 2 DAYS, 3 X 2 DAYS, 2 X 2 DAYS, 1 X 2 DAYS    Ref x 2     

## 2014-07-17 ENCOUNTER — Other Ambulatory Visit (INDEPENDENT_AMBULATORY_CARE_PROVIDER_SITE_OTHER): Payer: BLUE CROSS/BLUE SHIELD

## 2014-07-17 ENCOUNTER — Encounter: Payer: Self-pay | Admitting: Internal Medicine

## 2014-07-17 ENCOUNTER — Ambulatory Visit (INDEPENDENT_AMBULATORY_CARE_PROVIDER_SITE_OTHER): Payer: BLUE CROSS/BLUE SHIELD | Admitting: Internal Medicine

## 2014-07-17 VITALS — BP 114/70 | HR 79 | Ht 75.0 in | Wt 137.8 lb

## 2014-07-17 DIAGNOSIS — J3089 Other allergic rhinitis: Secondary | ICD-10-CM

## 2014-07-17 DIAGNOSIS — J302 Other seasonal allergic rhinitis: Secondary | ICD-10-CM

## 2014-07-17 DIAGNOSIS — J4540 Moderate persistent asthma, uncomplicated: Secondary | ICD-10-CM

## 2014-07-17 DIAGNOSIS — J309 Allergic rhinitis, unspecified: Secondary | ICD-10-CM

## 2014-07-17 DIAGNOSIS — J4551 Severe persistent asthma with (acute) exacerbation: Secondary | ICD-10-CM

## 2014-07-17 HISTORY — DX: Other seasonal allergic rhinitis: J30.2

## 2014-07-17 HISTORY — DX: Other allergic rhinitis: J30.89

## 2014-07-17 LAB — CBC WITH DIFFERENTIAL/PLATELET
Basophils Absolute: 0.1 10*3/uL (ref 0.0–0.1)
Basophils Relative: 1.1 % (ref 0.0–3.0)
EOS ABS: 1 10*3/uL — AB (ref 0.0–0.7)
Eosinophils Relative: 14 % — ABNORMAL HIGH (ref 0.0–5.0)
HCT: 42.1 % (ref 39.0–52.0)
Hemoglobin: 14.2 g/dL (ref 13.0–17.0)
LYMPHS PCT: 38.4 % (ref 12.0–46.0)
Lymphs Abs: 2.8 10*3/uL (ref 0.7–4.0)
MCHC: 33.7 g/dL (ref 30.0–36.0)
MCV: 90.4 fl (ref 78.0–100.0)
MONOS PCT: 5.9 % (ref 3.0–12.0)
Monocytes Absolute: 0.4 10*3/uL (ref 0.1–1.0)
NEUTROS PCT: 40.6 % — AB (ref 43.0–77.0)
Neutro Abs: 2.9 10*3/uL (ref 1.4–7.7)
PLATELETS: 196 10*3/uL (ref 150.0–400.0)
RBC: 4.66 Mil/uL (ref 4.22–5.81)
RDW: 14.4 % (ref 11.5–15.5)
WBC: 7.2 10*3/uL (ref 4.0–10.5)

## 2014-07-17 MED ORDER — METHYLPREDNISOLONE 8 MG PO TABS: ORAL_TABLET | ORAL | Status: DC

## 2014-07-17 MED ORDER — MONTELUKAST SODIUM 10 MG PO TABS: ORAL_TABLET | ORAL | Status: DC

## 2014-07-17 MED ORDER — TRAMADOL HCL 50 MG PO TABS: ORAL_TABLET | ORAL | Status: DC

## 2014-07-17 NOTE — Assessment & Plan Note (Signed)
Recent exacerbation now responding to prednisone taper. We discussed steroid sparing therapies. IgE has been too high for Xolair. He wants to go with allergy shots which we discussed. We will update allergy profile and IgE, anticipating we can build a vaccine from this. Plan-CBC with differential, allergy profile, refill Singulair, refill tramadol for cough

## 2014-07-17 NOTE — Patient Instructions (Signed)
Script sent for [facility] [facility]ulair  Scripts printed for methylprednisolone taper and for tramadol tabs  Order- lab- Allergy profile, CBC w diff,   Dx asthma moderate persistent  For the allergic nose now- otc allegra antihistamine, and Nasacort nasal spray  We should be able to build an allergy vaccine based on the blood tests, when those come back. If it looks ok, then we will have the allergy lab call you when your allergy  Vaccine is ready to start.

## 2014-07-17 NOTE — Progress Notes (Signed)
Patient ID: Drew Sandoval, male    DOB: Oct 02, 1975    MRN: [id number]  HPI 11/04/10- 37 yobm former smoker followed for severe asthma, chronic sinusitis, hype-reosinophilia and hyper IgE  Sent to Dr [name]  by Dr [name] for allergy evaluation. 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 Allergy profile- 08/11/10 IgE 1,747 with broad specific elevation Last here- August 31, 2010.- note reviewed. Since last here has been in hospital again, getting out 2 weeks ago. Feels well today.  Comes today as planned for allergy skin testing Skin test: Strongly positive especially grass, tree, dust mite  01/04/11- 39 yo former smoker followed for severe asthma, chronic sinusitis, hyper-eosinophilia and hyper IgE.  wife is with him  .Allergy vaccine was started 11/09/2010 and is building without problems He was still on prednisone when he saw Dr [name] recently and at that time he seemed improved after a recent exacerbation- 2 weeks ago. About 2 weeks after their visit he began coughing again. He hasn't noted any unusual exposure at work, or any sense that he had a cold.  His last IgE was 1747.  He doesn't feel "sick"- just tight and wheezy.  02/18/11-  89 yo former smoker followed for severe asthma, chronic sinusitis, hyper-eosinophilia and hyper IgE.  wife is with him Cooler weather is more comfortable for him. He continues to build allergy vaccine, now at 1:500, without problems. Some days continued to be better than others. 2 days ago thought his eyes were starting to itch and took Benadryl. We discussed comparison antihistamines. Not much sneezing. He has not needed his rescue inhaler in a long time but continues prednisone 20 mg daily. We educated on prednisone again.  04/20/11- 39 yo former smoker followed for severe asthma, chronic sinusitis, hyper-eosinophilia and hyper IgE.  wife is with him Went to ER 3 days ago- got neb and prednisone 60 mg x1. He finished  our last taper yesterday. Has had flu shot. Bending over, long walks or climbing stairs, and long work shifts all make him short of breath with wheezing. He worked last night and now feels "65%". He does not think he has an acute cold or infection. He was last really well about 5 days ago. He denies heartburn. He continues building allergy vaccine now at 1:50, without problems. IgE level has been out of range for Xolair.  07/19/11- 39 yo former smoker followed for severe asthma, chronic sinusitis, hyper-eosinophilia and hyper IgE. Separated from wife have now living with his mother. Asthma control is "a whole lot better". He has not missed work. Did have to go to emergency room for kidney stone. Questions allergy vaccine causing "dark spots" on his neck. Using prednisone only occasionally. No routine wheeze, does short of breath at times.  11/18/11- 39 yo former smoker followed for severe asthma, chronic sinusitis, hyper-eosinophilia and hyper IgE. Went to ER for approximately 4 asthma attacks in the past month; He has continued allergy vaccine at 1:50. He is needed his nebulizer 2 or 3 times daily and occasional rescue inhaler. He denies infection or change in workplace exposure. He does not recognize reflux. He usually flares as soon as he finishes the prednisone taper so he and his wife asked about maintenance prednisone which we discussed. Carefully. Sinus congestion tends to parallel his asthma episodes but he denies purulent nasal discharge or headache. We again discussed the significance of his elevated IgE and eosinophil levels. His IgE has been  too high for Xolair but we will reassess. CXR 11/11/11-  IMPRESSION: No active disease. Hyperinflation again noted.  Original Report Authenticated By: Natasha MeadLIVIU POP, M.D.   02/07/12- 39 yo former smoker followed for severe asthma, chronic sinusitis, hyper-eosinophilia and hyper IgE. FOLLOWS FOR: [facility]  02/04/2012. DC summary reviewed. Discharge diagnosis asthma. Asthma began during the night. He took his own nebulizer, felt better and tried to go to work but had to leave. He had [facility] where he was treated with 3 nebulizer treatments and sent out. [facility]. Discharged on a steroid taper. They comment in the summary that he was significantly depressed because he had watched his father died of asthma. He admits today that he had not told us of that history before. He feels better now but still wheezing and washed out as he tapers prednisone. He denies any sense of reflux or any recognized triggering experience. In the past he had thought there was something associated with his work place as discussed in earlier notes. He comments today that he can't breathe if he sleeps on his left side. There is no history of heart disease. He quit allergy vaccine in April, 2013.  11/19/13- 39 yo former smoker followed for severe asthma, chronic sinusitis, hyper-eosinophilia and hyper IgE- too high for Xolair.  FOLLOWS FOR:  Reports has good and bad days with breathing.  When having bad day, sob, wheezing and cough non-productive.  Hasn't done Allergy vaccine in 3-4 months due to no ride here to have done Hospital admission in March, emergency room visits in May and June because of asthma/cough. He denies any awareness of reflux, adenopathy, post nasal drainage. Cough is dry or scant white sputum. Has cough syrup at night but Tessalon does not help and daytime. He feels tight today. Last prednisone several weeks ago. Easy dyspnea on exertion without chest pain, palpitation, edema. 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 CXR 10/28/13- IMPRESSION:  Hyperinflation, otherwise negative. No change from prior study.  Electronically Signed  rec Continue routine meds Script for tramadol to try for  daytime cough You can also use an otc cough med like Delsym in between doses of tramadol if needed Script to try theophylline for breathing and cough. Take this med regularly, 1 twice daily after a meal, with food in your stomach. If it too strong, break it in half and take 1/2 tab twice daily We are stopping allergy shots for now, since you can't get down here easily.   12/10/2013 f/u ov/Wert re:  Severe asthma/ hyper E sp smoking cessation 2011/ 30 mg pred per day  Chief Complaint  Patient presents with  . Follow-up    Pt c/o wheezing with exertion, sometimes prod cough with clear mucus X3 months.  Recently saw CY for this, is being seen today to get a work release note.    breathing fine now and not needing rescue, typical of how he feels on prednisone, no better on theoph in terms of sob or need for saba No cough meds during the day, tramdol not helpful, does fine p tussionex, produces clear mucus each am.  No obvious day to day or daytime variabilty or assoc  cp or chest tightness, subjective wheeze overt sinus or hb symptoms. No unusual exp hx or h/o childhood pna/ asthma or knowledge of premature  birth. Sleeping ok p tussionex without nocturnal  or early am exacerbation  of respiratory  c/o's or need for noct saba. Also denies any obvious fluctuation of symptoms with weather or environmental changes or other aggravating or alleviating factors except as outlined above   01/24/14- 38 yoM former smoker followed for severe asthma, chronic sinusitis, hyper-eosinophilia and hyper IgE- too high for Xolair.  FOLLOWS FOR: Pt last seen by MW on 12/10/13. Pt has stopped with his vaccine for 1 year but wants to start up again d/t itchy eyes, runny nose, prod cough. Pt denies SOB and CP/tightness.   03/22/14- [age] yoM former smoker followed for severe asthma, chronic sinusitis, hyper-eosinophilia and hyper IgE- too high for Xolair FOLLOWS FOR: had attack yesterday. Pt states he lets his breathing get bad. Like  Symbicort better than Dulera and needs RX.We called in Pred taper yesterday.  Had flu vaccine. 3 weeks ago asthma began getting bad and really flared yesterday. Blames smell of burning leaves outdoors. Doesn't think he has a cold. Admits he let himself get out of hand before calling. Started prednisone last night and today feels much better.  07/17/14- 38 yoM former smoker followed for severe asthma, chronic sinusitis, hyper-eosinophilia and hyper IgE- too high for Xolair FOLLOWS FOR: Pt had attack last week-was given Prednisone; pt feels m-pred helps better. Also, patient needs refill for Singulair and Tessalon. Discuss allergy injections. Wants to try allergy shots again now that he has insurance to cover it. Blames recent exacerbation on weather change. Noticing increased sinus drainage without sneeze or headache.  ROS-see HPI   Negative unless "+" Constitutional:    weight loss, night sweats, fevers, chills, fatigue, lassitude. HEENT:    headaches, difficulty swallowing, tooth/dental problems, sore throat,       sneezing, itching, ear ache, +nasal congestion, post nasal drip, snoring CV:    chest pain, orthopnea, PND, swelling in lower extremities, anasarca,                                  dizziness, palpitations Resp:   shortness of breath with exertion or at rest.                productive cough,   non-productive cough, coughing up of blood.              change in color of mucus.  +wheezing.   Skin:    rash or lesions. GI:  No-   heartburn, indigestion, abdominal pain, nausea, vomiting,  GU: d. MS:   joint pain, stiffness, decreased range of motion, back pain. Neuro-     nothing unusual Psych:  change in mood or affect.  depression or anxiety.   memory loss.  Objective:   Physical Exam General- Alert, Oriented, Affect-+calm, Distress- none acute;  Tall, trim Skin- clear Lymphadenopathy- none Head- atraumatic            Eyes- Gross vision intact, PERRLA, conjunctivae clear  secretions            Ears- Hearing, canals normal            Nose- Clear, no-Septal dev, mucus, polyps, erosion, perforation             Throat- Mallampati II , mucosa clear , drainage- none, tonsils- atrophic Neck- flexible , trachea midline, no stridor , thyroid nl, carotid no bruit Chest - symmetrical excursion , unlabored  Heart/CV- RRR , no murmur , no gallop  , no rub, nl s1 s2                           - JVD- none , edema- none, stasis changes- none, varices- none           Lung-  Clear/ diminished, unlabored, no cough , dullness-none, rub- none,                         Wheeze-none       Abd- soft and benign  Br/ Gen/ Rectal- Not done, not indicated Extrem- cyanosis- none, clubbing, none, atrophy- none, strength- nl Neuro- grossly intact to observation

## 2014-07-18 LAB — ALLERGY FULL PROFILE
ALTERNARIA ALTERNATA: 6.81 kU/L — AB
ASPERGILLUS FUMIGATUS M3: 0.45 kU/L — AB
Allergen, D pternoyssinus,d7: 94.1 kU/L — ABNORMAL HIGH
BAHIA GRASS: 3.37 kU/L — AB
BERMUDA GRASS: 2.36 kU/L — AB
Box Elder IgE: 1.61 kU/L — ABNORMAL HIGH
CANDIDA ALBICANS: 20.3 kU/L — AB
COMMON RAGWEED: 3.11 kU/L — AB
Cat Dander: 2.15 kU/L — ABNORMAL HIGH
Curvularia lunata: 1.07 kU/L — ABNORMAL HIGH
DOG DANDER: 5.41 kU/L — AB
ELM IGE: 4.32 kU/L — AB
Fescue: 2.31 kU/L — ABNORMAL HIGH
G005 RYE, PERENNIAL: 2.11 kU/L — AB
G009 Red Top: 2.72 kU/L — ABNORMAL HIGH
GOOSE FEATHERS: 0.45 kU/L — AB
Goldenrod: 1.55 kU/L — ABNORMAL HIGH
HOUSE DUST HOLLISTER: 10.5 kU/L — AB
Helminthosporium halodes: 1.36 kU/L — ABNORMAL HIGH
IGE (IMMUNOGLOBULIN E), SERUM: 3283 kU/L — AB (ref <115)
LAMB'S QUARTERS CLASS: 1.33 kU/L — AB
OAK CLASS: 1.51 kU/L — AB
PLANTAIN: 2.25 kU/L — AB
STEMPHYLIUM BOTRYOSUM: 1.04 kU/L — AB
Sycamore Tree: 2.81 kU/L — ABNORMAL HIGH
Timothy Grass: 2.18 kU/L — ABNORMAL HIGH

## 2014-07-26 ENCOUNTER — Ambulatory Visit (INDEPENDENT_AMBULATORY_CARE_PROVIDER_SITE_OTHER): Payer: BLUE CROSS/BLUE SHIELD

## 2014-07-26 DIAGNOSIS — J309 Allergic rhinitis, unspecified: Secondary | ICD-10-CM

## 2014-07-30 ENCOUNTER — Ambulatory Visit (INDEPENDENT_AMBULATORY_CARE_PROVIDER_SITE_OTHER): Payer: BLUE CROSS/BLUE SHIELD

## 2014-07-30 DIAGNOSIS — J309 Allergic rhinitis, unspecified: Secondary | ICD-10-CM | POA: Diagnosis not present

## 2014-08-01 ENCOUNTER — Ambulatory Visit (INDEPENDENT_AMBULATORY_CARE_PROVIDER_SITE_OTHER): Payer: BLUE CROSS/BLUE SHIELD

## 2014-08-01 DIAGNOSIS — J309 Allergic rhinitis, unspecified: Secondary | ICD-10-CM | POA: Diagnosis not present

## 2014-08-06 ENCOUNTER — Ambulatory Visit (INDEPENDENT_AMBULATORY_CARE_PROVIDER_SITE_OTHER): Payer: BLUE CROSS/BLUE SHIELD

## 2014-08-06 DIAGNOSIS — J309 Allergic rhinitis, unspecified: Secondary | ICD-10-CM

## 2014-08-09 ENCOUNTER — Ambulatory Visit (INDEPENDENT_AMBULATORY_CARE_PROVIDER_SITE_OTHER): Payer: BLUE CROSS/BLUE SHIELD

## 2014-08-09 DIAGNOSIS — J309 Allergic rhinitis, unspecified: Secondary | ICD-10-CM | POA: Diagnosis not present

## 2014-08-13 ENCOUNTER — Ambulatory Visit (INDEPENDENT_AMBULATORY_CARE_PROVIDER_SITE_OTHER): Payer: BLUE CROSS/BLUE SHIELD

## 2014-08-13 DIAGNOSIS — J309 Allergic rhinitis, unspecified: Secondary | ICD-10-CM

## 2014-08-16 ENCOUNTER — Ambulatory Visit (INDEPENDENT_AMBULATORY_CARE_PROVIDER_SITE_OTHER): Payer: BLUE CROSS/BLUE SHIELD

## 2014-08-16 DIAGNOSIS — J309 Allergic rhinitis, unspecified: Secondary | ICD-10-CM

## 2014-08-20 ENCOUNTER — Ambulatory Visit (INDEPENDENT_AMBULATORY_CARE_PROVIDER_SITE_OTHER): Payer: BLUE CROSS/BLUE SHIELD

## 2014-08-20 DIAGNOSIS — J309 Allergic rhinitis, unspecified: Secondary | ICD-10-CM | POA: Diagnosis not present

## 2014-08-23 ENCOUNTER — Ambulatory Visit (INDEPENDENT_AMBULATORY_CARE_PROVIDER_SITE_OTHER): Payer: BLUE CROSS/BLUE SHIELD

## 2014-08-23 DIAGNOSIS — J309 Allergic rhinitis, unspecified: Secondary | ICD-10-CM

## 2014-08-27 ENCOUNTER — Ambulatory Visit (INDEPENDENT_AMBULATORY_CARE_PROVIDER_SITE_OTHER): Payer: BLUE CROSS/BLUE SHIELD

## 2014-08-27 DIAGNOSIS — J309 Allergic rhinitis, unspecified: Secondary | ICD-10-CM | POA: Diagnosis not present

## 2014-08-28 ENCOUNTER — Ambulatory Visit (INDEPENDENT_AMBULATORY_CARE_PROVIDER_SITE_OTHER): Payer: BLUE CROSS/BLUE SHIELD

## 2014-08-28 DIAGNOSIS — J309 Allergic rhinitis, unspecified: Secondary | ICD-10-CM | POA: Diagnosis not present

## 2014-08-30 ENCOUNTER — Ambulatory Visit (INDEPENDENT_AMBULATORY_CARE_PROVIDER_SITE_OTHER): Payer: BLUE CROSS/BLUE SHIELD

## 2014-08-30 DIAGNOSIS — J309 Allergic rhinitis, unspecified: Secondary | ICD-10-CM | POA: Diagnosis not present

## 2014-09-03 ENCOUNTER — Encounter: Payer: Self-pay | Admitting: Internal Medicine

## 2014-09-03 ENCOUNTER — Ambulatory Visit (INDEPENDENT_AMBULATORY_CARE_PROVIDER_SITE_OTHER): Payer: BLUE CROSS/BLUE SHIELD

## 2014-09-03 DIAGNOSIS — J309 Allergic rhinitis, unspecified: Secondary | ICD-10-CM | POA: Diagnosis not present

## 2014-09-06 ENCOUNTER — Ambulatory Visit (INDEPENDENT_AMBULATORY_CARE_PROVIDER_SITE_OTHER): Payer: BLUE CROSS/BLUE SHIELD

## 2014-09-06 DIAGNOSIS — J309 Allergic rhinitis, unspecified: Secondary | ICD-10-CM | POA: Diagnosis not present

## 2014-09-09 ENCOUNTER — Telehealth: Payer: Self-pay | Admitting: Internal Medicine

## 2014-09-09 ENCOUNTER — Ambulatory Visit: Payer: BLUE CROSS/BLUE SHIELD

## 2014-09-09 MED ORDER — TRAMADOL HCL 50 MG PO TABS: ORAL_TABLET | ORAL | Status: DC

## 2014-09-09 NOTE — Telephone Encounter (Signed)
I called spoke with the pharmacists. He needed to know Drew Sandoval's last name since she called in RX and also DEA #. I have done so. Nothing further needed

## 2014-09-09 NOTE — Telephone Encounter (Signed)
Pt is now calling back [phone] said someone called

## 2014-09-09 NOTE — Telephone Encounter (Signed)
Ok to refill 

## 2014-09-09 NOTE — Telephone Encounter (Signed)
Pt requesting a refill of Tramadol (use prn cough #40)  Last filled 06/18/13 Upcoming appt 09/19/14 CY- Please advise on refill request. Thanks.     Medication List       This list is accurate as of: 09/09/14  1:01 PM.  Always use your most recent med list.               albuterol 108 (90 BASE) MCG/ACT inhaler  Commonly known as:  PROVENTIL HFA;VENTOLIN HFA  Inhale 2 puffs into the lungs every 4 (four) hours as needed for wheezing or shortness of breath.     albuterol (2.5 MG/3ML) 0.083% nebulizer solution  Commonly known as:  PROVENTIL  Take 3 mLs (2.5 mg total) by nebulization every 6 (six) hours as needed for wheezing or shortness of breath.     budesonide-formoterol 160-4.5 MCG/ACT inhaler  Commonly known as:  SYMBICORT  2 puffs twice daily, then rinse mouth     EPINEPHrine 0.3 mg/0.3 mL Soaj injection  Commonly known as:  EPIPEN  Inject into thigh for severe allergic reaction     famotidine 20 MG tablet  Commonly known as:  PEPCID  One at bedtime     methylPREDNISolone 8 MG tablet  Commonly known as:  MEDROL  4 X 2 DAYS, 3 X 2 DAYS, 2 X 2 DAYS, 1 X 2 DAYS     montelukast 10 MG tablet  Commonly known as:  SINGULAIR  TAKE 1 TABLET BY MOUTH AT BEDTIME     traMADol 50 MG tablet  Commonly known as:  ULTRAM  1 every 8 hours if needed for cough       Allergies  Allergen Reactions  . Azithromycin Rash  . Alvesco [Ciclesonide]     Possible reaction- rash  . Anoro Ellipta [Umeclidinium-Vilanterol]     Possible- rash  . Banana Swelling    Throat swelling   . Penicillins Rash

## 2014-09-09 NOTE — Telephone Encounter (Signed)
Rx has been called in per CY. Pt is aware. Nothing further was needed. 

## 2014-09-10 ENCOUNTER — Ambulatory Visit (INDEPENDENT_AMBULATORY_CARE_PROVIDER_SITE_OTHER): Payer: BLUE CROSS/BLUE SHIELD

## 2014-09-10 DIAGNOSIS — J309 Allergic rhinitis, unspecified: Secondary | ICD-10-CM

## 2014-09-13 ENCOUNTER — Ambulatory Visit (INDEPENDENT_AMBULATORY_CARE_PROVIDER_SITE_OTHER): Payer: BLUE CROSS/BLUE SHIELD

## 2014-09-13 DIAGNOSIS — J309 Allergic rhinitis, unspecified: Secondary | ICD-10-CM

## 2014-09-17 ENCOUNTER — Ambulatory Visit (INDEPENDENT_AMBULATORY_CARE_PROVIDER_SITE_OTHER): Payer: BLUE CROSS/BLUE SHIELD

## 2014-09-17 DIAGNOSIS — J309 Allergic rhinitis, unspecified: Secondary | ICD-10-CM | POA: Diagnosis not present

## 2014-09-19 ENCOUNTER — Encounter: Payer: Self-pay | Admitting: Internal Medicine

## 2014-09-19 ENCOUNTER — Ambulatory Visit (INDEPENDENT_AMBULATORY_CARE_PROVIDER_SITE_OTHER): Payer: BLUE CROSS/BLUE SHIELD | Admitting: Internal Medicine

## 2014-09-19 VITALS — BP 102/68 | HR 79 | Ht 75.0 in | Wt 140.0 lb

## 2014-09-19 DIAGNOSIS — D721 Eosinophilia, unspecified: Secondary | ICD-10-CM

## 2014-09-19 DIAGNOSIS — J309 Allergic rhinitis, unspecified: Secondary | ICD-10-CM | POA: Diagnosis not present

## 2014-09-19 DIAGNOSIS — J9601 Acute respiratory failure with hypoxia: Secondary | ICD-10-CM

## 2014-09-19 DIAGNOSIS — H1031 Unspecified acute conjunctivitis, right eye: Secondary | ICD-10-CM | POA: Diagnosis not present

## 2014-09-19 DIAGNOSIS — J302 Other seasonal allergic rhinitis: Secondary | ICD-10-CM

## 2014-09-19 DIAGNOSIS — J4540 Moderate persistent asthma, uncomplicated: Secondary | ICD-10-CM

## 2014-09-19 DIAGNOSIS — J3089 Other allergic rhinitis: Secondary | ICD-10-CM
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
## 2014-09-19 NOTE — Patient Instructions (Signed)
We can continue current meds  Suggest otc eye drops Artificial tears like Systane or Genteal  Or Allergy eye drops like Allway or Naphcon-A  If your eye gets worse, you may need to see an eye doctor.

## 2014-09-20 ENCOUNTER — Encounter: Payer: Self-pay | Admitting: Internal Medicine

## 2014-09-20 ENCOUNTER — Ambulatory Visit (INDEPENDENT_AMBULATORY_CARE_PROVIDER_SITE_OTHER): Payer: BLUE CROSS/BLUE SHIELD

## 2014-09-20 DIAGNOSIS — J309 Allergic rhinitis, unspecified: Secondary | ICD-10-CM

## 2014-09-21 ENCOUNTER — Encounter (HOSPITAL_COMMUNITY): Payer: Self-pay

## 2014-09-21 ENCOUNTER — Emergency Department (HOSPITAL_COMMUNITY)
Admission: EM | Admit: 2014-09-21 | Discharge: 2014-09-21 | Disposition: A | Discharge status: Home / Self Care | Payer: BLUE CROSS/BLUE SHIELD | Attending: Emergency Medicine | Admitting: Emergency Medicine

## 2014-09-21 ENCOUNTER — Emergency Department (HOSPITAL_COMMUNITY): Payer: BLUE CROSS/BLUE SHIELD

## 2014-09-21 DIAGNOSIS — J45901 Unspecified asthma with (acute) exacerbation: Secondary | ICD-10-CM | POA: Insufficient documentation

## 2014-09-21 DIAGNOSIS — R05 Cough: Secondary | ICD-10-CM | POA: Diagnosis present

## 2014-09-21 DIAGNOSIS — M791 Myalgia: Secondary | ICD-10-CM | POA: Insufficient documentation

## 2014-09-21 DIAGNOSIS — Z79899 Other long term (current) drug therapy: Secondary | ICD-10-CM | POA: Diagnosis not present

## 2014-09-21 DIAGNOSIS — Z880 Allergy status to penicillin: Secondary | ICD-10-CM | POA: Insufficient documentation

## 2014-09-21 DIAGNOSIS — Z87891 Personal history of nicotine dependence: Secondary | ICD-10-CM | POA: Diagnosis not present

## 2014-09-21 DIAGNOSIS — Z7951 Long term (current) use of inhaled steroids: Secondary | ICD-10-CM | POA: Insufficient documentation

## 2014-09-21 DIAGNOSIS — B349 Viral infection, unspecified: Secondary | ICD-10-CM | POA: Insufficient documentation

## 2014-09-21 MED ORDER — ONDANSETRON HCL 4 MG/2ML IJ SOLN
4.0000 mg | Freq: Once | INTRAMUSCULAR | Status: AC
  Administered 2014-09-21: 4 mg via INTRAMUSCULAR
  Filled 2014-09-21: qty 2

## 2014-09-21 MED ORDER — PREDNISONE 50 MG PO TABS
60.0000 mg | ORAL_TABLET | Freq: Once | ORAL | Status: AC
  Administered 2014-09-21: 60 mg via ORAL
  Filled 2014-09-21 (×2): qty 1

## 2014-09-21 MED ORDER — KETOROLAC TROMETHAMINE 30 MG/ML IJ SOLN
30.0000 mg | Freq: Once | INTRAMUSCULAR | Status: AC
  Administered 2014-09-21: 30 mg via INTRAVENOUS
  Filled 2014-09-21: qty 1

## 2014-09-21 MED ORDER — SODIUM CHLORIDE 0.9 % IV BOLUS (SEPSIS)
1000.0000 mL | Freq: Once | INTRAVENOUS | Status: AC
  Administered 2014-09-21: 1000 mL via INTRAVENOUS

## 2014-09-21 MED ORDER — ALBUTEROL SULFATE HFA 108 (90 BASE) MCG/ACT IN AERS
1.0000 | INHALATION_SPRAY | Freq: Once | RESPIRATORY_TRACT | Status: AC
  Administered 2014-09-21: 1 via RESPIRATORY_TRACT
  Filled 2014-09-21: qty 6.7

## 2014-09-21 MED ORDER — DOXYCYCLINE HYCLATE 100 MG PO CAPS: 100.0000 mg | ORAL_CAPSULE | Freq: Two times a day (BID) | ORAL | Status: DC

## 2014-09-21 MED ORDER — PREDNISONE 20 MG PO TABS: 60.0000 mg | ORAL_TABLET | Freq: Every day | ORAL | Status: DC

## 2014-09-21 NOTE — ED Notes (Signed)
Pt reports generalized body aches for a couple of days

## 2014-09-21 NOTE — ED Notes (Signed)
Ambulated around nurse's station. Oxygen sats 94% dropped to 90%, but patient denied any shortness of breath or difficulty with ambulation.

## 2014-09-21 NOTE — ED Provider Notes (Signed)
CSN: [id number]     Arrival date & time 09/21/14  0343 History   First MD Initiated Contact with Patient 09/21/14 0354     Chief Complaint  Patient presents with  . Generalized Body Aches     (Consider location/radiation/quality/duration/timing/severity/associated sxs/prior Treatment) HPI  This is a 39 year old male with a history of asthma who presents with generalized body ache, cough, and vomiting. Patient reports onset of symptoms today. He has had known sick contacts.  Reports nonproductive cough. Endorses chills without fevers. Also reports vomiting. No abdominal pain or diarrhea. Did not receive a flu shot this year.  Past Medical History  Diagnosis Date  . Asthma   . Seasonal allergies    Past Surgical History  Procedure Laterality Date  . None     Family History  Problem Relation Age of Onset  . Emphysema Father   . Lung cancer Mother    History  Substance Use Topics  . Smoking status: Former Smoker -- 0.50 packs/day for 11.5 years    Types: Cigarettes    Quit date: 04/09/2010  . Smokeless tobacco: Never Used     Comment: started at age 39.  1/2 ppd.    . Alcohol Use: No    Review of Systems  Constitutional: Positive for chills. Negative for fever.  Respiratory: Positive for cough. Negative for chest tightness and shortness of breath.   Cardiovascular: Negative.  Negative for chest pain.  Gastrointestinal: Positive for nausea and vomiting. Negative for abdominal pain and diarrhea.  Genitourinary: Negative.  Negative for dysuria.  Musculoskeletal: Positive for myalgias. Negative for back pain.  Skin: Negative for rash.  Neurological: Negative for headaches.  All other systems reviewed and are negative.     Allergies  Azithromycin; Alvesco; Anoro ellipta; Banana; and Penicillins  Home Medications   Prior to Admission medications   Medication Sig Start Date End Date Taking? Authorizing Provider  albuterol (PROVENTIL HFA;VENTOLIN HFA) 108 (90 BASE)  MCG/ACT inhaler Inhale 2 puffs into the lungs every 4 (four) hours as needed for wheezing or shortness of breath. 04/29/14 04/29/15 Yes Waymon Budgelinton D Young, MD  albuterol (PROVENTIL) (2.5 MG/3ML) 0.083% nebulizer solution Take 3 mLs (2.5 mg total) by nebulization every 6 (six) hours as needed for wheezing or shortness of breath. 05/13/14  Yes Waymon Budgelinton D Young, MD  budesonide-formoterol [facility](SYMBICORT) 160-4.5 MCG/ACT inhaler 2 puffs twice daily, then rinse mouth 03/22/14  Yes Waymon Budgelinton D Young, MD  EPINEPHrine (EPIPEN) 0.3 mg/0.3 mL SOAJ injection Inject into thigh for severe allergic reaction 03/05/13  Yes Waymon Budgelinton D Young, MD  montelukast (SINGULAIR) 10 MG tablet TAKE 1 TABLET BY MOUTH AT BEDTIME 07/17/14  Yes Waymon Budgelinton D Young, MD  traMADol (ULTRAM) 50 MG tablet 1 every 8 hours if needed for cough 09/09/14  Yes Waymon Budgelinton D Young, MD  doxycycline (VIBRAMYCIN) 100 MG capsule Take 1 capsule (100 mg total) by mouth 2 (two) times daily. 09/21/14   Shon Batonourtney F Horton, MD  famotidine (PEPCID) 20 MG tablet One at bedtime Patient not taking: Reported on 09/19/2014 12/10/13   Nyoka CowdenMichael B Wert, MD  predniSONE (DELTASONE) 20 MG tablet Take 3 tablets (60 mg total) by mouth daily with breakfast. 09/21/14   Shon Batonourtney F Horton, MD   BP 123/68 mmHg  Pulse 80  Temp(Src) 98.9 F (37.2 C) (Oral)  Resp 16  Ht 6\' 3"  (1.905 m)  Wt 140 lb (63.504 kg)  BMI 17.50 kg/m2  SpO2 90% Physical Exam  Constitutional: He is oriented to person, place, and time.  He appears well-developed and well-nourished. No distress.  HENT:  Head: Normocephalic and atraumatic.  Cardiovascular: Normal rate, regular rhythm and normal heart sounds.   No murmur heard. Pulmonary/Chest: Effort normal. No respiratory distress. He has wheezes.  Scant expiratory wheeze  Abdominal: Soft. Bowel sounds are normal. There is no tenderness. There is no rebound.  Musculoskeletal: He exhibits no edema.  Neurological: He is alert and oriented to person, place, and time.  Skin:  Skin is warm and dry. No rash noted.  Psychiatric: He has a normal mood and affect.  Nursing note and vitals reviewed.   ED Course  Procedures (including critical care time) Labs Review Labs Reviewed - No data to display  Imaging Review Dg Chest 2 View  09/21/2014   CLINICAL DATA:  Generalized body aches for a couple of days with congestion. History of asthma.  EXAM: CHEST  2 VIEW  COMPARISON:  Chest radiograph October 28, 2013  FINDINGS: RIGHT middle lobe partial collapse. Increased lung volumes, similar to prior examination. No pleural effusion. Cardiomediastinal silhouette is unremarkable. No pneumothorax. Soft tissue planes and included osseous structures are nonsuspicious.  IMPRESSION: RIGHT middle lobe partial collapse.  Similar hyperinflation.   Electronically Signed   By: Awilda Metroourtnay  Bloomer   On: 09/21/2014 06:51     EKG Interpretation None      MDM   Final diagnoses:  Viral syndrome  Asthma exacerbation    Patient presents with chills and bodyaches. History of asthma with scant wheezing on exam. Otherwise nontoxic. Temperature 99.6. Patient given fluids and Toradol. Chest x-ray obtained to rule out pneumonia. Chest x-ray with right middle lobe partial collapse but otherwise unremarkable. Patient given an inhaler with some improvement of his cough. Given chest x-ray findings, would elect to treat for atypical pneumonia. He has an allergy to azithromycin. Will treat with doxycycline. Will also place on steroids given wheezing and history of asthma. Patient reports improvement with Toradol. Will discharge home with supportive care.  After history, exam, and medical workup I feel the patient has been appropriately medically screened and is safe for discharge home. Pertinent diagnoses were discussed with the patient. Patient was given return precautions.     Shon Batonourtney F Horton, MD 09/22/14 [phone]

## 2014-09-21 NOTE — ED Notes (Signed)
Patient with no complaints at this time. Respirations even and unlabored. Skin warm/dry. Discharge instructions reviewed with patient at this time. Patient given opportunity to voice concerns/ask questions. IV removed per policy and band-aid applied to site. Patient discharged at this time and left Emergency Department with steady gait.  

## 2014-09-21 NOTE — Discharge Instructions (Signed)
Viral Infections A viral infection can be caused by different types of viruses.Most viral infections are not serious and resolve on their own. However, some infections may cause severe symptoms and may lead to further complications. SYMPTOMS Viruses can frequently cause:  Minor sore throat.  Aches and pains.  Headaches.  Runny nose.  Different types of rashes.  Watery eyes.  Tiredness.  Cough.  Loss of appetite.  Gastrointestinal infections, resulting in nausea, vomiting, and diarrhea. These symptoms do not respond to antibiotics because the infection is not caused by bacteria. However, you might catch a bacterial infection following the viral infection. This is sometimes called a "superinfection." Symptoms of such a bacterial infection may include:  Worsening sore throat with pus and difficulty swallowing.  Swollen neck glands.  Chills and a high or persistent fever.  Severe headache.  Tenderness over the sinuses.  Persistent overall ill feeling (malaise), muscle aches, and tiredness (fatigue).  Persistent cough.  Yellow, green, or brown mucus production with coughing. HOME CARE INSTRUCTIONS   Only take over-the-counter or prescription medicines for pain, discomfort, diarrhea, or fever as directed by your caregiver.  Drink enough water and fluids to keep your urine clear or pale yellow. Sports drinks can provide valuable electrolytes, sugars, and hydration.  Get plenty of rest and maintain proper nutrition. Soups and broths with crackers or rice are fine. SEEK IMMEDIATE MEDICAL CARE IF:   You have severe headaches, shortness of breath, chest pain, neck pain, or an unusual rash.  You have uncontrolled vomiting, diarrhea, or you are unable to keep down fluids.  You or your child has an oral temperature above 102 F (38.9 C), not controlled by medicine.  Your baby is older than 3 months with a rectal temperature of 102 F (38.9 C) or higher.  Your baby is 983  months old or younger with a rectal temperature of 100.4 F (38 C) or higher. MAKE SURE YOU:   Understand these instructions.  Will watch your condition.  Will get help right away if you are not doing well or get worse. Document Released: 02/03/2005 Document Revised: 07/19/2011 Document Reviewed: 08/31/2010 [facility] Patient Information 2015 [place], [place]. This information is not intended to replace advice given to you by your health care provider. Make sure you discuss any questions you have with your health care provider.   Asthma, Acute Bronchospasm Acute bronchospasm caused by asthma is also referred to as an asthma attack. Bronchospasm means your air passages become narrowed. The narrowing is caused by inflammation and tightening of the muscles in the air tubes (bronchi) in your lungs. This can make it hard to breathe or cause you to wheeze and cough. CAUSES Possible triggers are:  Animal dander from the skin, hair, or feathers of animals.  Dust mites contained in house dust.  Cockroaches.  Pollen from trees or grass.  Mold.  Cigarette or tobacco smoke.  Air pollutants such as dust, household cleaners, hair sprays, aerosol sprays, paint fumes, strong chemicals, or strong odors.  Cold air or weather changes. Cold air may trigger inflammation. Winds increase molds and pollens in the air.  Strong emotions such as crying or laughing hard.  Stress.  Certain medicines such as aspirin or beta-blockers.  Sulfites in foods and drinks, such as dried fruits and wine.  Infections or inflammatory conditions, such as a flu, cold, or inflammation of the nasal membranes (rhinitis).  Gastroesophageal reflux disease (GERD). GERD is a condition where stomach acid backs up into your esophagus.  Exercise  or strenuous activity. SIGNS AND SYMPTOMS   Wheezing.  Excessive coughing, particularly at night.  Chest tightness.  Shortness of breath. DIAGNOSIS  Your health care  provider will ask you about your medical history and perform a physical exam. A chest X-ray or blood testing may be performed to look for other causes of your symptoms or other conditions that may have triggered your asthma attack. TREATMENT  Treatment is aimed at reducing inflammation and opening up the airways in your lungs. Most asthma attacks are treated with inhaled medicines. These include quick relief or rescue medicines (such as bronchodilators) and controller medicines (such as inhaled corticosteroids). These medicines are sometimes given through an inhaler or a nebulizer. Systemic steroid medicine taken by mouth or given through an IV tube also can be used to reduce the inflammation when an attack is moderate or severe. Antibiotic medicines are only used if a bacterial infection is present.  HOME CARE INSTRUCTIONS   Rest.  Drink plenty of liquids. This helps the mucus to remain thin and be easily coughed up. Only use caffeine in moderation and do not use alcohol until you have recovered from your illness.  Do not smoke. Avoid being exposed to secondhand smoke.  You play a critical role in keeping yourself in good health. Avoid exposure to things that cause you to wheeze or to have breathing problems.  Keep your medicines up-to-date and available. Carefully follow your health care provider's treatment plan.  Take your medicine exactly as prescribed.  When pollen or pollution is bad, keep windows closed and use an air conditioner or go to places with air conditioning.  Asthma requires careful medical care. See your health care provider for a follow-up as advised. If you are more than [redacted] weeks pregnant and you were prescribed any new medicines, let your obstetrician know about the visit and how you are doing. Follow up with your health care provider as directed.  After you have recovered from your asthma attack, make an appointment with your outpatient doctor to talk about ways to  reduce the likelihood of future attacks. If you do not have a doctor who manages your asthma, make an appointment with a primary care doctor to discuss your asthma. SEEK IMMEDIATE MEDICAL CARE IF:   You are getting worse.  You have trouble breathing. If severe, call your local emergency services (911 in the [place]  You develop chest pain or discomfort.  You are vomiting.  You are not able to keep fluids down.  You are coughing up yellow, green, brown, or bloody sputum.  You have a fever and your symptoms suddenly get worse.  You have trouble swallowing. MAKE SURE YOU:   Understand these instructions.  Will watch your condition.  Will get help right away if you are not doing well or get worse. Document Released: 08/11/2006 Document Revised: 05/01/2013 Document Reviewed: 11/01/2012 [facility]ExitCare Patient Information 2015 [place], [place]. This information is not intended to replace advice given to you by your health care provider. Make sure you discuss any questions you have with your health care provider.

## 2014-09-24 ENCOUNTER — Emergency Department (HOSPITAL_COMMUNITY): Payer: BLUE CROSS/BLUE SHIELD

## 2014-09-24 ENCOUNTER — Observation Stay (HOSPITAL_COMMUNITY)
Admission: EM | Admit: 2014-09-24 | Discharge: 2014-09-25 | Disposition: A | Discharge status: Home / Self Care | Payer: BLUE CROSS/BLUE SHIELD | Attending: Internal Medicine | Admitting: Internal Medicine

## 2014-09-24 ENCOUNTER — Encounter (HOSPITAL_COMMUNITY): Payer: Self-pay | Admitting: Emergency Medicine

## 2014-09-24 ENCOUNTER — Other Ambulatory Visit: Payer: Self-pay

## 2014-09-24 ENCOUNTER — Encounter: Payer: Self-pay | Admitting: Internal Medicine

## 2014-09-24 ENCOUNTER — Ambulatory Visit (INDEPENDENT_AMBULATORY_CARE_PROVIDER_SITE_OTHER): Payer: BLUE CROSS/BLUE SHIELD | Admitting: Internal Medicine

## 2014-09-24 ENCOUNTER — Other Ambulatory Visit (HOSPITAL_COMMUNITY): Payer: Self-pay

## 2014-09-24 ENCOUNTER — Ambulatory Visit (INDEPENDENT_AMBULATORY_CARE_PROVIDER_SITE_OTHER): Payer: BLUE CROSS/BLUE SHIELD

## 2014-09-24 VITALS — BP 122/72 | HR 162 | Temp 98.9°F | Ht 75.0 in | Wt 138.8 lb

## 2014-09-24 DIAGNOSIS — E440 Moderate protein-calorie malnutrition: Secondary | ICD-10-CM | POA: Diagnosis present

## 2014-09-24 DIAGNOSIS — I471 Supraventricular tachycardia, unspecified: Secondary | ICD-10-CM

## 2014-09-24 DIAGNOSIS — J45901 Unspecified asthma with (acute) exacerbation: Principal | ICD-10-CM | POA: Diagnosis present

## 2014-09-24 DIAGNOSIS — R0602 Shortness of breath: Secondary | ICD-10-CM

## 2014-09-24 DIAGNOSIS — Z881 Allergy status to other antibiotic agents status: Secondary | ICD-10-CM | POA: Insufficient documentation

## 2014-09-24 DIAGNOSIS — Z888 Allergy status to other drugs, medicaments and biological substances status: Secondary | ICD-10-CM | POA: Insufficient documentation

## 2014-09-24 DIAGNOSIS — Z91018 Allergy to other foods: Secondary | ICD-10-CM | POA: Diagnosis not present

## 2014-09-24 DIAGNOSIS — J309 Allergic rhinitis, unspecified: Secondary | ICD-10-CM

## 2014-09-24 DIAGNOSIS — Z880 Allergy status to penicillin: Secondary | ICD-10-CM | POA: Diagnosis not present

## 2014-09-24 DIAGNOSIS — R636 Underweight: Secondary | ICD-10-CM | POA: Diagnosis present

## 2014-09-24 DIAGNOSIS — Z681 Body mass index (BMI) 19 or less, adult: Secondary | ICD-10-CM | POA: Diagnosis not present

## 2014-09-24 DIAGNOSIS — J302 Other seasonal allergic rhinitis: Secondary | ICD-10-CM

## 2014-09-24 DIAGNOSIS — R000 Tachycardia, unspecified: Secondary | ICD-10-CM | POA: Diagnosis not present

## 2014-09-24 DIAGNOSIS — J9601 Acute respiratory failure with hypoxia: Secondary | ICD-10-CM | POA: Diagnosis not present

## 2014-09-24 DIAGNOSIS — J3089 Other allergic rhinitis: Secondary | ICD-10-CM

## 2014-09-24 DIAGNOSIS — Z87891 Personal history of nicotine dependence: Secondary | ICD-10-CM | POA: Diagnosis not present

## 2014-09-24 DIAGNOSIS — J189 Pneumonia, unspecified organism: Secondary | ICD-10-CM | POA: Diagnosis not present

## 2014-09-24 HISTORY — DX: Supraventricular tachycardia: I47.1

## 2014-09-24 HISTORY — DX: Supraventricular tachycardia, unspecified: I47.10

## 2014-09-24 LAB — CBC WITH DIFFERENTIAL/PLATELET
BASOS PCT: 1 % (ref 0–1)
Basophils Absolute: 0.1 10*3/uL (ref 0.0–0.1)
Eosinophils Absolute: 0 10*3/uL (ref 0.0–0.7)
Eosinophils Relative: 0 % (ref 0–5)
HCT: 42.7 % (ref 39.0–52.0)
Hemoglobin: 14.6 g/dL (ref 13.0–17.0)
LYMPHS ABS: 2.2 10*3/uL (ref 0.7–4.0)
Lymphocytes Relative: 38 % (ref 12–46)
MCH: 30.5 pg (ref 26.0–34.0)
MCHC: 34.2 g/dL (ref 30.0–36.0)
MCV: 89.3 fL (ref 78.0–100.0)
MONO ABS: 0.8 10*3/uL (ref 0.1–1.0)
MONOS PCT: 14 % — AB (ref 3–12)
Neutro Abs: 2.8 10*3/uL (ref 1.7–7.7)
Neutrophils Relative %: 47 % (ref 43–77)
Platelets: 169 10*3/uL (ref 150–400)
RBC: 4.78 MIL/uL (ref 4.22–5.81)
RDW: 13.3 % (ref 11.5–15.5)
WBC: 5.9 10*3/uL (ref 4.0–10.5)

## 2014-09-24 LAB — BASIC METABOLIC PANEL
ANION GAP: 9 (ref 5–15)
BUN: 24 mg/dL — ABNORMAL HIGH (ref 6–20)
CHLORIDE: 109 mmol/L (ref 101–111)
CO2: 22 mmol/L (ref 22–32)
Calcium: 8.7 mg/dL — ABNORMAL LOW (ref 8.9–10.3)
Creatinine, Ser: 0.76 mg/dL (ref 0.61–1.24)
GFR calc non Af Amer: >60 mL/min (ref >60)
Glucose, Bld: 110 mg/dL — ABNORMAL HIGH (ref 65–99)
POTASSIUM: 3.2 mmol/L — AB (ref 3.5–5.1)
Sodium: 140 mmol/L (ref 135–145)

## 2014-09-24 LAB — TROPONIN I: Troponin I: <0.03 ng/mL (ref <0.031)

## 2014-09-24 LAB — BRAIN NATRIURETIC PEPTIDE: B NATRIURETIC PEPTIDE 5: 23.5 pg/mL (ref 0.0–100.0)

## 2014-09-24 MED ORDER — MONTELUKAST SODIUM 10 MG PO TABS
10.0000 mg | ORAL_TABLET | Freq: Every day | ORAL | Status: DC
  Administered 2014-09-24: 10 mg via ORAL
  Filled 2014-09-24: qty 1

## 2014-09-24 MED ORDER — LEVALBUTEROL HCL 0.63 MG/3ML IN NEBU
0.6300 mg | INHALATION_SOLUTION | RESPIRATORY_TRACT | Status: DC | PRN
  Administered 2014-09-24: 0.63 mg via RESPIRATORY_TRACT
  Filled 2014-09-24: qty 3

## 2014-09-24 MED ORDER — ADENOSINE 6 MG/2ML IV SOLN
INTRAVENOUS | Status: AC
  Filled 2014-09-24: qty 2

## 2014-09-24 MED ORDER — ENOXAPARIN SODIUM 40 MG/0.4ML ~~LOC~~ SOLN
40.0000 mg | SUBCUTANEOUS | Status: DC
  Administered 2014-09-24: 40 mg via SUBCUTANEOUS
  Filled 2014-09-24: qty 0.4

## 2014-09-24 MED ORDER — ACETAMINOPHEN 650 MG RE SUPP: 650.0000 mg | Freq: Four times a day (QID) | RECTAL | Status: DC | PRN

## 2014-09-24 MED ORDER — ADENOSINE 6 MG/2ML IV SOLN
INTRAVENOUS | Status: AC
  Administered 2014-09-24: 6 mg
  Filled 2014-09-24: qty 2

## 2014-09-24 MED ORDER — LEVALBUTEROL HCL 1.25 MG/0.5ML IN NEBU
1.2500 mg | INHALATION_SOLUTION | Freq: Four times a day (QID) | RESPIRATORY_TRACT | Status: DC | PRN
  Filled 2014-09-24: qty 0.5

## 2014-09-24 MED ORDER — ENSURE ENLIVE PO LIQD
237.0000 mL | Freq: Two times a day (BID) | ORAL | Status: DC
  Administered 2014-09-24 – 2014-09-25 (×2): 237 mL via ORAL

## 2014-09-24 MED ORDER — ALBUTEROL (5 MG/ML) CONTINUOUS INHALATION SOLN
10.0000 mg/h | INHALATION_SOLUTION | Freq: Once | RESPIRATORY_TRACT | Status: AC
  Administered 2014-09-24: 10 mg/h via RESPIRATORY_TRACT
  Filled 2014-09-24: qty 20

## 2014-09-24 MED ORDER — DOXYCYCLINE HYCLATE 100 MG PO TABS
100.0000 mg | ORAL_TABLET | Freq: Two times a day (BID) | ORAL | Status: DC
  Administered 2014-09-24 – 2014-09-25 (×2): 100 mg via ORAL
  Filled 2014-09-24 (×5): qty 1

## 2014-09-24 MED ORDER — SODIUM CHLORIDE 0.9 % IV SOLN
INTRAVENOUS | Status: AC
  Administered 2014-09-24 (×2): via INTRAVENOUS

## 2014-09-24 MED ORDER — MAGNESIUM SULFATE 2 GM/50ML IV SOLN
2.0000 g | Freq: Once | INTRAVENOUS | Status: AC
Start: 2014-09-24 — End: 2014-09-24
  Administered 2014-09-24: 2 g via INTRAVENOUS
  Filled 2014-09-24: qty 50

## 2014-09-24 MED ORDER — ONDANSETRON HCL 4 MG PO TABS: 4.0000 mg | ORAL_TABLET | Freq: Four times a day (QID) | ORAL | Status: DC | PRN

## 2014-09-24 MED ORDER — ACETAMINOPHEN 325 MG PO TABS
650.0000 mg | ORAL_TABLET | Freq: Four times a day (QID) | ORAL | Status: DC | PRN
Start: 2014-09-24 — End: 2014-09-25

## 2014-09-24 MED ORDER — ONDANSETRON HCL 4 MG/2ML IJ SOLN: 4.0000 mg | Freq: Four times a day (QID) | INTRAMUSCULAR | Status: DC | PRN

## 2014-09-24 MED ORDER — SODIUM CHLORIDE 0.9 % IJ SOLN
3.0000 mL | Freq: Two times a day (BID) | INTRAMUSCULAR | Status: DC
  Administered 2014-09-24 – 2014-09-25 (×2): 3 mL via INTRAVENOUS

## 2014-09-24 MED ORDER — LEVALBUTEROL HCL 0.63 MG/3ML IN NEBU: 0.6300 mg | INHALATION_SOLUTION | Freq: Three times a day (TID) | RESPIRATORY_TRACT | Status: DC

## 2014-09-24 MED ORDER — METHYLPREDNISOLONE SODIUM SUCC 40 MG IJ SOLR
40.0000 mg | Freq: Three times a day (TID) | INTRAMUSCULAR | Status: DC
  Administered 2014-09-24: 40 mg via INTRAVENOUS
  Filled 2014-09-24: qty 1

## 2014-09-24 MED ORDER — METHYLPREDNISOLONE SODIUM SUCC 125 MG IJ SOLR
125.0000 mg | Freq: Once | INTRAMUSCULAR | Status: AC
  Administered 2014-09-24: 125 mg via INTRAVENOUS
  Filled 2014-09-24: qty 2

## 2014-09-24 MED ORDER — PREDNISONE 20 MG PO TABS
40.0000 mg | ORAL_TABLET | Freq: Every day | ORAL | Status: DC
  Administered 2014-09-25: 40 mg via ORAL
  Filled 2014-09-24: qty 2

## 2014-09-24 MED ORDER — LEVALBUTEROL HCL 0.63 MG/3ML IN NEBU
0.6300 mg | INHALATION_SOLUTION | Freq: Four times a day (QID) | RESPIRATORY_TRACT | Status: DC
  Administered 2014-09-24: 0.63 mg via RESPIRATORY_TRACT
  Filled 2014-09-24: qty 3

## 2014-09-24 MED ORDER — PRO-STAT SUGAR FREE PO LIQD
30.0000 mL | Freq: Two times a day (BID) | ORAL | Status: DC
  Administered 2014-09-25: 30 mL via ORAL
  Filled 2014-09-24 (×5): qty 30

## 2014-09-24 NOTE — Assessment & Plan Note (Signed)
Known atopic with previous work-up. IgE always too high for Xolair therapy.

## 2014-09-24 NOTE — Progress Notes (Signed)
Pt arrived from ED on stretcher, ambulated self to BR and to bed w/ steady, independent gait. Denies pain at present. Does report mild DOE. VSS. Pt oriented to callbell and environment. Tele 44 confirmed w/ CMT. Dr Rito EhrlichKrishnan paged for admission orders.

## 2014-09-24 NOTE — Progress Notes (Signed)
Patient ID: Drew Sandoval, male    DOB: Oct 02, 1975    MRN: [id number]  HPI 11/04/10- 37 yobm former smoker followed for severe asthma, chronic sinusitis, hype-reosinophilia and hyper IgE  Sent to Dr [name]  by Dr [name] for allergy evaluation. 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 Allergy profile- 08/11/10 IgE 1,747 with broad specific elevation Last here- August 31, 2010.- note reviewed. Since last here has been in hospital again, getting out 2 weeks ago. Feels well today.  Comes today as planned for allergy skin testing Skin test: Strongly positive especially grass, tree, dust mite  01/04/11- 39 yo former smoker followed for severe asthma, chronic sinusitis, hyper-eosinophilia and hyper IgE.  wife is with him  .Allergy vaccine was started 11/09/2010 and is building without problems He was still on prednisone when he saw Dr [name] recently and at that time he seemed improved after a recent exacerbation- 2 weeks ago. About 2 weeks after their visit he began coughing again. He hasn't noted any unusual exposure at work, or any sense that he had a cold.  His last IgE was 1747.  He doesn't feel "sick"- just tight and wheezy.  02/18/11-  89 yo former smoker followed for severe asthma, chronic sinusitis, hyper-eosinophilia and hyper IgE.  wife is with him Cooler weather is more comfortable for him. He continues to build allergy vaccine, now at 1:500, without problems. Some days continued to be better than others. 2 days ago thought his eyes were starting to itch and took Benadryl. We discussed comparison antihistamines. Not much sneezing. He has not needed his rescue inhaler in a long time but continues prednisone 20 mg daily. We educated on prednisone again.  04/20/11- 39 yo former smoker followed for severe asthma, chronic sinusitis, hyper-eosinophilia and hyper IgE.  wife is with him Went to ER 3 days ago- got neb and prednisone 60 mg x1. He finished  our last taper yesterday. Has had flu shot. Bending over, long walks or climbing stairs, and long work shifts all make him short of breath with wheezing. He worked last night and now feels "65%". He does not think he has an acute cold or infection. He was last really well about 5 days ago. He denies heartburn. He continues building allergy vaccine now at 1:50, without problems. IgE level has been out of range for Xolair.  07/19/11- 39 yo former smoker followed for severe asthma, chronic sinusitis, hyper-eosinophilia and hyper IgE. Separated from wife have now living with his mother. Asthma control is "a whole lot better". He has not missed work. Did have to go to emergency room for kidney stone. Questions allergy vaccine causing "dark spots" on his neck. Using prednisone only occasionally. No routine wheeze, does short of breath at times.  11/18/11- 39 yo former smoker followed for severe asthma, chronic sinusitis, hyper-eosinophilia and hyper IgE. Went to ER for approximately 4 asthma attacks in the past month; He has continued allergy vaccine at 1:50. He is needed his nebulizer 2 or 3 times daily and occasional rescue inhaler. He denies infection or change in workplace exposure. He does not recognize reflux. He usually flares as soon as he finishes the prednisone taper so he and his wife asked about maintenance prednisone which we discussed. Carefully. Sinus congestion tends to parallel his asthma episodes but he denies purulent nasal discharge or headache. We again discussed the significance of his elevated IgE and eosinophil levels. His IgE has been  too high for Xolair but we will reassess. CXR 11/11/11-  IMPRESSION: No active disease. Hyperinflation again noted.  Original Report Authenticated By: Natasha MeadLIVIU POP, M.D.   02/07/12- 39 yo former smoker followed for severe asthma, chronic sinusitis, hyper-eosinophilia and hyper IgE. FOLLOWS FOR: [facility] through  02/04/2012. DC summary reviewed. Discharge diagnosis asthma. Asthma began during the night. He took his own nebulizer, felt better and tried to go to work but had to leave. He had presented [facility] where he was treated with 3 nebulizer treatments and sent out. He [facility]. Discharged on a steroid taper. They comment in the summary that he was significantly depressed because he had watched his father died of asthma. He admits today that he had not told us of that history before. He feels better now but still wheezing and washed out as he tapers prednisone. He denies any sense of reflux or any recognized triggering experience. In the past he had thought there was something associated with his work place as discussed in earlier notes. He comments today that he can't breathe if he sleeps on his left side. There is no history of heart disease. He quit allergy vaccine in April, 2013.  11/19/13- 37 yo former smoker followed for severe asthma, chronic sinusitis, hyper-eosinophilia and hyper IgE- too high for Xolair.  FOLLOWS FOR:  Reports has good and bad days with breathing.  When having bad day, sob, wheezing and cough non-productive.  Hasn't done Allergy vaccine in 3-4 months due to no ride here to have done Hospital admission in March, emergency room visits in May and June because of asthma/cough. He denies any awareness of reflux, adenopathy, post nasal drainage. Cough is dry or scant white sputum. Has cough syrup at night but Tessalon does not help and daytime. He feels tight today. Last prednisone several weeks ago. Easy dyspnea on exertion without chest pain, palpitation, edema. 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 CXR 10/28/13- IMPRESSION:  Hyperinflation, otherwise negative. No change from prior study.  Electronically Signed  rec Continue routine meds Script for tramadol to try for  daytime cough You can also use an otc cough med like Delsym in between doses of tramadol if needed Script to try theophylline for breathing and cough. Take this med regularly, 1 twice daily after a meal, with food in your stomach. If it too strong, break it in half and take 1/2 tab twice daily We are stopping allergy shots for now, since you can't get down here easily.   12/10/2013 f/u ov/Wert re:  Severe asthma/ hyper E sp smoking cessation 2011/ 30 mg pred per day  Chief Complaint  Patient presents with  . Follow-up    Pt c/o wheezing with exertion, sometimes prod cough with clear mucus X3 months.  Recently saw CY for this, is being seen today to get a work release note.    breathing fine now and not needing rescue, typical of how he feels on prednisone, no better on theoph in terms of sob or need for saba No cough meds during the day, tramdol not helpful, does fine p tussionex, produces clear mucus each am.  No obvious day to day or daytime variabilty or assoc  cp or chest tightness, subjective wheeze overt sinus or hb symptoms. No unusual exp hx or h/o childhood pna/ asthma or knowledge of premature  birth. Sleeping ok p tussionex without nocturnal  or early am exacerbation  of respiratory  c/o's or need for noct saba. Also denies any obvious fluctuation of symptoms with weather or environmental changes or other aggravating or alleviating factors except as outlined above   01/24/14- 38 yoM former smoker followed for severe asthma, chronic sinusitis, hyper-eosinophilia and hyper IgE- too high for Xolair.  FOLLOWS FOR: Pt last seen by MW on 12/10/13. Pt has stopped with his vaccine for 1 year but wants to start up again d/t itchy eyes, runny nose, prod cough. Pt denies SOB and CP/tightness.   03/22/14- [age] yoM former smoker followed for severe asthma, chronic sinusitis, hyper-eosinophilia and hyper IgE- too high for Xolair FOLLOWS FOR: had attack yesterday. Pt states he lets his breathing get bad. Like  Symbicort better than Dulera and needs RX.We called in Pred taper yesterday.  Had flu vaccine. 3 weeks ago asthma began getting bad and really flared yesterday. Blames smell of burning leaves outdoors. Doesn't think he has a cold. Admits he let himself get out of hand before calling. Started prednisone last night and today feels much better.  07/17/14- 38 yoM former smoker followed for severe asthma, chronic sinusitis, hyper-eosinophilia and hyper IgE- too high for Xolair FOLLOWS FOR: Pt had attack last week-was given Prednisone; pt feels m-pred helps better. Also, patient needs refill for Singulair and Tessalon. Discuss allergy injections. Wants to try allergy shots again now that he has insurance to cover it. Blames recent exacerbation on weather change. Noticing increased sinus drainage without sneeze or headache.  09/19/14- 38 yoM former smoker followed for severe asthma, chronic sinusitis, hyper-eosinophilia and hyper IgE- too high for Xolair Follows For: Asthma is well controlled. C/o dry cough. After allergy shot in 09/17/14 right eye became swollen, red and tender.   09/24/14-  [age] yoM former smoker followed for severe asthma, chronic sinusitis, hyper-eosinophilia and hyper IgE- too high for Xolair ACUTE VISIT: Flare up-cant walk 5 steps without getting SOB. Went to [facility] ER Sunday fpor asthma flare, with myalgias-currently on 60 mg of Prednisone.  Patient arrived at this office today for allergy shot. Noted weak, dyspneic, tachycardia. EKG 09/24/14 shows regular SVT at 160.  CXR 09/21/14- I reviewed image and agree IMPRESSION: RIGHT middle lobe partial collapse. Similar hyperinflation. Electronically Signed  By: Awilda Metroourtnay Bloomer  On: 09/21/2014 06:51  ROS-see HPI   Negative unless "+" Constitutional:    weight loss, night sweats, fevers, chills,+ fatigue, lassitude. HEENT:    headaches, difficulty swallowing, tooth/dental problems, sore throat,       sneezing, itching, ear ache,  +nasal congestion, post nasal drip, snoring CV:    chest pain, orthopnea, PND, swelling in lower extremities, anasarca,                                  dizziness, palpitations Resp:   +shortness of breath with exertion or at rest.                productive cough,   +non-productive cough, coughing up of blood.              change in color of mucus.  +wheezing.   Skin:    rash or lesions. GI:  No-   heartburn, indigestion, abdominal pain, nausea, vomiting,  GU: d. MS:   joint pain, stiffness, decreased range of motion, back pain. Neuro-     nothing unusual Psych:  change in mood or affect.  depression or anxiety.   memory loss.  Objective:   Physical Exam General- Alert, Oriented, Affect-uncomfortable, near tears, Distress+ acute;  Tall, thin Skin- clear Lymphadenopathy- none Head- atraumatic            Eyes- Gross vision intact, PERRLA, conjunctivae clear secretions            Ears- Hearing, canals normal            Nose- Clear, no-Septal dev, mucus, polyps, erosion, perforation             Throat- Mallampati II , mucosa clear , drainage- none, tonsils- atrophic Neck- flexible , trachea midline, no stridor , thyroid nl, carotid no bruit Chest - symmetrical excursion , unlabored           Heart/CV- RRR rapid , no murmur , no gallop  , no rub, nl s1 s2                           - JVD- none , edema- none, stasis changes- none, varices- none           Lung-   diminished, unlabored, no cough , dullness-none, rub- none,                         Wheeze-faint Abd- scaphoid  Br/ Gen/ Rectal- Not done, not indicated Extrem- cyanosis- none, clubbing, none, atrophy- none, strength- nl Neuro- +slight tremor

## 2014-09-24 NOTE — Progress Notes (Addendum)
RT did assessment pt scored 4. Rt made nebs Xopenex TID due to pt has DIM/BS,states he has sob, increase HR in ED, and is here for asthma. Pt was given 10MG  albuterol continuous neb in ED. Pt takes nebs and inhalers at home PRN. Pt in no distress at this time.

## 2014-09-24 NOTE — ED Notes (Signed)
Pt's heart rate increased to 180, cardiac monitor showing SVT with PVCs, ED at bedside immediately, Adenosine  Ordered and administered at 1056. EKG performed at 1058, back to NSR.

## 2014-09-24 NOTE — Patient Instructions (Signed)
Patient is going to ER across the street. He drove himself here and feels able to make this trip safely.

## 2014-09-24 NOTE — Progress Notes (Signed)
Initial Nutrition Assessment  DOCUMENTATION CODES:  Non-severe (moderate) malnutrition in context of chronic illness, Underweight  INTERVENTION:  Boost Breeze BID  NUTRITION DIAGNOSIS:  Increased nutrient needs related to chronic illness as evidenced by estimated needs.  GOAL:  Patient will meet greater than or equal to 90% of their needs  MONITOR:  PO intake, Supplement acceptance, Weight trends, Labs  REASON FOR ASSESSMENT:  Malnutrition Screening Tool  ASSESSMENT: Pt with hx of severe asthma and chronic sinusitis admitted for SOB.  Pt seen for MST and underweight status. Pt ate 100% of lunch per visualization of tray. He reports decreased appetite PTA and that he would typically eat 2 small meals/day. Since admission earlier today he has been feeling more hungry than usual. Pt reports UBW of 165 lbs and that he last weighed this 2-3 months ago when his appetite started to decline. Per weight hx review, it appears that pt's weight has been stable between 136-146 lbs in the past 10 months.   Unable to determine if pt is meeting needs at this time. Will order Prostat BID to supplement. Labs and medications reviewed. Physical assessment indicates mild fat and moderate muscle wasting.  Height:  Ht Readings from Last 1 Encounters:  09/24/14 6\' 3"  (1.905 m)    Weight:  Wt Readings from Last 1 Encounters:  09/24/14 136 lb 3.9 oz (61.8 kg)    Ideal Body Weight:  89.1 kg (kg)  Wt Readings from Last 10 Encounters:  09/24/14 136 lb 3.9 oz (61.8 kg)  09/24/14 138 lb 12.8 oz (62.959 kg)  09/21/14 140 lb (63.504 kg)  09/19/14 140 lb (63.504 kg)  07/17/14 137 lb 12.8 oz (62.506 kg)  03/22/14 146 lb 12.8 oz (66.588 kg)  01/24/14 146 lb (66.225 kg)  12/10/13 139 lb (63.05 kg)  11/19/13 146 lb 6.4 oz (66.407 kg)  10/28/13 150 lb (68.04 kg)    BMI:  Body mass index is 17.03 kg/(m^2).  Estimated Nutritional Needs:  Kcal:  1700-1900  Protein:  75-90 grams  Fluid:   2L/day  Skin:  WDL  Diet Order:  Diet regular Room service appropriate?: Yes; Fluid consistency:: Thin  EDUCATION NEEDS:  No education needs identified at this time  No intake or output data in the 24 hours ending 09/24/14 1451  Last BM:  5/16   Trenton GammonJessica Caelum Federici, RD, LDN Inpatient Clinical Dietitian Pager # [phone] After hours/weekend pager # [phone]

## 2014-09-24 NOTE — Assessment & Plan Note (Signed)
He presented to ER on 5/14 with exacerbation including aching all over. He was given prednisone. CXR shows chronic hyperinflation/ long thorax. New finding partial atelectasis RML- probable mucus plug but possible pneumonia. EKG shows SVT reflecting heavy use of bronchodilators. Imp- Acutely ill. He feels well enough to drive himself [facility]. [facility]

## 2014-09-24 NOTE — ED Notes (Signed)
Pt to ed co sob, was seen recently for similar symptoms. Pt co chest pain and cough clear sputum. Hx asthma. Pt was seen by pneumologist and was sent to Ed for further evaluation.

## 2014-09-24 NOTE — ED Notes (Signed)
Pt, being sent by [facility], r/o PNA.  C/o increasing SOB and weakness.  Pt was seen at [facility] x 2-3 days ago and started on Prednisone.  Hx of asthma.  Pt presented w/ HR 160s and w/o fever.

## 2014-09-24 NOTE — H&P (Signed)
History and Physical  Drew Sandoval [id number] DOB: 13-Apr-1976 DOA: 09/24/2014  Referring physician: Dutch Quinthris Pollina, ER physician PCP: [name],[name], MD   Chief Complaint: Shortness of breath  HPI: Drew PavlovMarshall Kalbfleisch is a 39 y.o. male  With past medical history of asthma who was seen in the emergency room several days ago with complaints of shortness of breath and cough. At time chest x-ray noted a mild pneumonia and patient started on by mouth doxycycline and discharged home. He returned back today with persistent wheezing and difficulty catching his breath. Chest x-ray actually noted improvement in pneumonia, but it was felt that it had contributed to an asthma exacerbation. Patient received steroids supplemental oxygen and nebulizer treatments. He received a number of doses of albuterol and initially looked to be improving, but then noted to go into sustained SVT with a heart rate in the 160s. Patient required cardioversion which was done in the emergency room successfully. It is felt best he needed to come in overnight for observation. Hospitals call for further evaluation   Review of Systems:  Patient seen after arrival to floor Pt complains of weakness and mild shortness of breath, although much improved  Pt denies any headaches, vision changes, chest pain, palpitations, cough, abdominal pain, hematuria, dysuria, constipation, diarrhea, focal extremity numbness weakness or pain  Review of systems are otherwise negative  Past Medical History  Diagnosis Date  . Asthma   . Seasonal allergies    Past Surgical History  Procedure Laterality Date  . None     Social History:  reports that he quit smoking about 4 years ago. His smoking use included Cigarettes. He has a 5.75 pack-year smoking history. He has never used smokeless tobacco. He reports that he does not drink alcohol or use illicit drugs. Patient lives at home with his fianceis able to participate in activities of daily living  with out assistance  Allergies  Allergen Reactions  . Azithromycin Rash  . Alvesco [Ciclesonide]     Possible reaction- rash  . Anoro Ellipta [Umeclidinium-Vilanterol]     Possible- rash  . Banana Swelling    Throat swelling   . Penicillins Rash    Family History  Problem Relation Age of Onset  . Emphysema Father   . Lung cancer Mother     diabetes  Prior to Admission medications   Medication Sig Start Date End Date Taking? Authorizing Provider  albuterol (PROVENTIL HFA;VENTOLIN HFA) 108 (90 BASE) MCG/ACT inhaler Inhale 2 puffs into the lungs every 4 (four) hours as needed for wheezing or shortness of breath. 04/29/14 04/29/15 Yes Waymon Budgelinton D Young, MD  albuterol (PROVENTIL) (2.5 MG/3ML) 0.083% nebulizer solution Take 3 mLs (2.5 mg total) by nebulization every 6 (six) hours as needed for wheezing or shortness of breath. 05/13/14  Yes Waymon Budgelinton D Young, MD  budesonide-formoterol [facility](SYMBICORT) 160-4.5 MCG/ACT inhaler 2 puffs twice daily, then rinse mouth 03/22/14  Yes Waymon Budgelinton D Young, MD  doxycycline (VIBRAMYCIN) 100 MG capsule Take 1 capsule (100 mg total) by mouth 2 (two) times daily. 09/21/14  Yes Shon Batonourtney F Horton, MD  EPINEPHrine (EPIPEN) 0.3 mg/0.3 mL SOAJ injection Inject into thigh for severe allergic reaction 03/05/13  Yes Waymon Budgelinton D Young, MD  montelukast (SINGULAIR) 10 MG tablet TAKE 1 TABLET BY MOUTH AT BEDTIME 07/17/14  Yes Waymon Budgelinton D Young, MD  predniSONE (DELTASONE) 20 MG tablet Take 3 tablets (60 mg total) by mouth daily with breakfast. 09/21/14  Yes Shon Batonourtney F Horton, MD  traMADol (ULTRAM) 50 MG tablet 1 every  8 hours if needed for cough 09/09/14  Yes Waymon Budgelinton D Young, MD    Physical Exam: BP 130/83 mmHg  Pulse 90  Temp(Src) 97.9 F (36.6 C) (Oral)  Resp 20  Ht 6\' 3"  (1.905 m)  Wt 61.8 kg (136 lb 3.9 oz)  BMI 17.03 kg/m2  SpO2 94%  General:  alert and oriented 3, no acute distress, underweight Eyes: sclera nonicteric, extraocular movements are intact ENT: normocephalic  major medical mucous membranes are moist Neck: no JVD Cardiovascular: regular rate and rhythm, S1-S2 Respiratory: mild prolonged expiratory phase with no active wheezing Abdomen: soft, nontender, nondistended, positive bowel sounds Skin: no skin breaks, tears or lesions Musculoskeletal: no clubbing cyanosis or edema Psychiatry: Patient is appropriate, no evidence of psychoses Neurology: No overt deficits           Labs on Admission:  Basic Metabolic Panel:  Recent Labs Lab 09/24/14 0943  NA 140  K 3.2*  CL 109  CO2 22  GLUCOSE 110*  BUN 24*  CREATININE 0.76  CALCIUM 8.7*   Liver Function Tests: No results for input(s): AST, ALT, ALKPHOS, BILITOT, PROT, ALBUMIN in the last 168 hours. No results for input(s): LIPASE, AMYLASE in the last 168 hours. No results for input(s): AMMONIA in the last 168 hours. CBC:  Recent Labs Lab 09/24/14 0943  WBC 5.9  NEUTROABS 2.8  HGB 14.6  HCT 42.7  MCV 89.3  PLT 169   Cardiac Enzymes:  Recent Labs Lab 09/24/14 0943  TROPONINI <0.03    BNP (last 3 results)  Recent Labs  09/24/14 0943  BNP 23.5    ProBNP (last 3 results) No results for input(s): PROBNP in the last 8760 hours.  CBG: No results for input(s): GLUCAP in the last 168 hours.  Radiological Exams on Admission: Dg Chest 2 View  09/24/2014   CLINICAL DATA:  Worsening shortness of breath the last few days  EXAM: CHEST  2 VIEW  COMPARISON:  09/21/2014, 07/17/2013  FINDINGS: There is interval improved aeration of the right middle lobe. There is no focal parenchymal opacity. There is no pleural effusion or pneumothorax. The heart and mediastinal contours are unremarkable.  The osseous structures are unremarkable.  IMPRESSION: No active cardiopulmonary disease.   Electronically Signed   By: Elige KoHetal  Patel   On: 09/24/2014 10:36    EKG: Independently reviewed. Initial EKG notes normal sinus rhythm, followed by second EKG noting sustained SVT  Assessment/Plan Present  on Admission:  . Asthma exacerbation: Much improved by arrival to floor. Breathing comfortably on room air. Minimal prolonged expiratory phase, no active wheezing. Tapering dose of steroids and when necessary Xopenex.   . Sustained SVT: Able to be successfully cardioverted in the emergency room. Spoke with cardiology who recommended monitoring overnight on telemetry. If no further events, no further workup indicated in the hospital and patient can be followed up in their office.  . Malnutrition of moderate degree: Patient meets criteria in the context of chronic illness. Seen by nutrition, started on boost breeze twice a day  . Underweight: Patient meets criteria with BMI under 18  Recent pneumonia: Community-acquired. 6 much improved by x-ray, would continue by mouth doxycycline  Consultants: Case discussed with cardiology by phone  Code Status: Full code  Family Communication: Fianc at the bedside   Disposition Plan: If no further episodes, likely discharge home in the morning  Time spent: 35 minutes  Hollice EspyKRISHNAN,Kelsi Benham K [facility] Pager [phone]

## 2014-09-24 NOTE — ED Provider Notes (Signed)
CSN: [id number]     Arrival date & time 09/24/14  0920 History   First MD Initiated Contact with Patient 09/24/14 [phone]     Chief Complaint  Patient presents with  . Shortness of Breath     (Consider location/radiation/quality/duration/timing/severity/associated sxs/prior Treatment) HPI Comments: Patient referred to the emergency department by [facility] pulmonology. Patient has been persistently experiencing cough and shortness of breath for nearly a week. Patient reports a history of asthma. He was seen in the emergency department at [facility] on May 14. At that time patient had mild hypoxia, but did not drop below 90%. He was able to ambulate after treatments and was discharged. He went to [facility] pulmonology today for follow-up, was complaining of increasing shortness of breath and was found to have a heart rate of 160s. This apparently spontaneously resolved before arrival in the ER today.  Patient is a 39 y.o. male presenting with shortness of breath.  Shortness of Breath Associated symptoms: cough     Past Medical History  Diagnosis Date  . Asthma   . Seasonal allergies    Past Surgical History  Procedure Laterality Date  . None     Family History  Problem Relation Age of Onset  . Emphysema Father   . Lung cancer Mother    History  Substance Use Topics  . Smoking status: Former Smoker -- 0.50 packs/day for 11.5 years    Types: Cigarettes    Quit date: 04/09/2010  . Smokeless tobacco: Never Used     Comment: started at age 89.  1/2 ppd.    . Alcohol Use: No    Review of Systems  Respiratory: Positive for cough and shortness of breath.   All other systems reviewed and are negative.     Allergies  Azithromycin; Alvesco; Anoro ellipta; Banana; and Penicillins  Home Medications   Prior to Admission medications   Medication Sig Start Date End Date Taking? Authorizing Provider  albuterol (PROVENTIL HFA;VENTOLIN HFA) 108 (90 BASE) MCG/ACT inhaler Inhale 2 puffs into  the lungs every 4 (four) hours as needed for wheezing or shortness of breath. 04/29/14 04/29/15 Yes [name], MD  albuterol (PROVENTIL) (2.5 MG/3ML) 0.083% nebulizer solution Take 3 mLs (2.5 mg total) by nebulization every 6 (six) hours as needed for wheezing or shortness of breath. 05/13/14  Yes [name], MD  budesonide-formoterol [facility]) 160-4.5 MCG/ACT inhaler 2 puffs twice daily, then rinse mouth 03/22/14  Yes [name], MD  doxycycline (VIBRAMYCIN) 100 MG capsule Take 1 capsule (100 mg total) by mouth 2 (two) times daily. 09/21/14  Yes Shon Baton, MD  EPINEPHrine (EPIPEN) 0.3 mg/0.3 mL SOAJ injection Inject into thigh for severe allergic reaction 03/05/13  Yes [name], MD  montelukast (SINGULAIR) 10 MG tablet TAKE 1 TABLET BY MOUTH AT BEDTIME 07/17/14  Yes [name], MD  predniSONE (DELTASONE) 20 MG tablet Take 3 tablets (60 mg total) by mouth daily with breakfast. 09/21/14  Yes Shon Baton, MD  traMADol (ULTRAM) 50 MG tablet 1 every 8 hours if needed for cough 09/09/14  Yes [name], MD   BP 145/84 mmHg  Pulse 170  Temp(Src) 98.3 F (36.8 C) (Oral)  Resp 22  SpO2 100% Physical Exam  Constitutional: He is oriented to person, place, and time. He appears well-developed and well-nourished. No distress.  HENT:  Head: Normocephalic and atraumatic.  Right Ear: Hearing normal.  Left Ear: Hearing normal.  Nose: Nose normal.  Mouth/Throat:  Oropharynx is clear and moist and mucous membranes are normal.  Eyes: Conjunctivae and EOM are normal. Pupils are equal, round, and reactive to light.  Neck: Normal range of motion. Neck supple.  Cardiovascular: Regular rhythm, S1 normal and S2 normal.  Exam reveals no gallop and no friction rub.   No murmur heard. Pulmonary/Chest: Effort normal. No respiratory distress. He has wheezes. He has rhonchi. He exhibits no tenderness.  Abdominal: Soft. Normal appearance and bowel sounds are normal. There is no  hepatosplenomegaly. There is no tenderness. There is no rebound, no guarding, no tenderness at McBurney's point and negative Murphy's sign. No hernia.  Musculoskeletal: Normal range of motion.  Neurological: He is alert and oriented to person, place, and time. He has normal strength. No cranial nerve deficit or sensory deficit. Coordination normal. GCS eye subscore is 4. GCS verbal subscore is 5. GCS motor subscore is 6.  Skin: Skin is warm, dry and intact. No rash noted. No cyanosis.  Psychiatric: He has a normal mood and affect. His speech is normal and behavior is normal. Thought content normal.  Nursing note and vitals reviewed.   ED Course  CARDIOVERSION Date/Time: 09/24/2014 11:28 AM Performed by: Gilda CreasePOLLINA, CHRISTOPHER J Authorized by: Gilda CreasePOLLINA, CHRISTOPHER J Consent: The procedure was performed in an emergent situation. Verbal consent obtained. Risks and benefits: risks, benefits and alternatives were discussed Consent given by: patient Patient understanding: patient states understanding of the procedure being performed Patient consent: the patient's understanding of the procedure matches consent given Test results: test results available and properly labeled Patient identity confirmed: verbally with patient, arm band and hospital-assigned identification number Time out: Immediately prior to procedure a "time out" was called to verify the correct patient, procedure, equipment, support staff and site/side marked as required. Patient sedated: no Cardioversion basis: emergent Pre-procedure rhythm: supraventricular tachycardia Number of attempts: 1 dose of IV adenosine push with successful cardioversion. Post-procedure rhythm: normal sinus rhythm Complications: no complications Patient tolerance: Patient tolerated the procedure well with no immediate complications   (including critical care time) Labs Review Labs Reviewed  CBC WITH DIFFERENTIAL/PLATELET - Abnormal; Notable for the  following:    Monocytes Relative 14 (*)    All other components within normal limits  BASIC METABOLIC PANEL - Abnormal; Notable for the following:    Potassium 3.2 (*)    Glucose, Bld 110 (*)    BUN 24 (*)    Calcium 8.7 (*)    All other components within normal limits  BRAIN NATRIURETIC PEPTIDE  TROPONIN I    Imaging Review Dg Chest 2 View  09/24/2014   CLINICAL DATA:  Worsening shortness of breath the last few days  EXAM: CHEST  2 VIEW  COMPARISON:  09/21/2014, 07/17/2013  FINDINGS: There is interval improved aeration of the right middle lobe. There is no focal parenchymal opacity. There is no pleural effusion or pneumothorax. The heart and mediastinal contours are unremarkable.  The osseous structures are unremarkable.  IMPRESSION: No active cardiopulmonary disease.   Electronically Signed   By: Elige KoHetal  Patel   On: 09/24/2014 10:36     EKG Interpretation None      MDM   Final diagnoses:  Shortness of breath  Asthma exacerbation  SVT (supraventricular tachycardia)    Patient presented to the emergency department for evaluation of progressively worsening difficulty breathing. Patient has a history of asthma, has been already treated in the ER once. Patient already using maximal bronchodilator therapy and prednisone with worsening condition. Examination arrival revealed mild  hypoxia on room air, sats low 90s at rest. He had significant wheezing and rhonchi throughout both lung fields. Patient initiated on continuous bronchodilator therapy for 1 hour, Solu-Medrol, magnesium. Breathing has improved, but patient did have an episode of SVT. Patient had a heart rate of 190 which was narrow complex tachycardia. This converted with 1 dose of adenosine 6 mg IV push. Patient had a documented heart rate of 160 at the pulmonologist earlier today, presumably SVT as well. Patient will be observed on telemetry for further management of asthma exacerbation and observation for recurrent  SVT.    Gilda Creasehristopher J Pollina, MD 09/24/14 1144

## 2014-09-25 ENCOUNTER — Ambulatory Visit (INDEPENDENT_AMBULATORY_CARE_PROVIDER_SITE_OTHER): Payer: BLUE CROSS/BLUE SHIELD

## 2014-09-25 DIAGNOSIS — J309 Allergic rhinitis, unspecified: Secondary | ICD-10-CM | POA: Diagnosis not present

## 2014-09-25 DIAGNOSIS — J9601 Acute respiratory failure with hypoxia: Secondary | ICD-10-CM | POA: Diagnosis not present

## 2014-09-25 DIAGNOSIS — E440 Moderate protein-calorie malnutrition: Secondary | ICD-10-CM | POA: Diagnosis not present

## 2014-09-25 DIAGNOSIS — I471 Supraventricular tachycardia: Secondary | ICD-10-CM | POA: Diagnosis not present

## 2014-09-25 DIAGNOSIS — J45901 Unspecified asthma with (acute) exacerbation: Secondary | ICD-10-CM | POA: Diagnosis not present

## 2014-09-25 DIAGNOSIS — R636 Underweight: Secondary | ICD-10-CM

## 2014-09-25 MED ORDER — PRO-STAT SUGAR FREE PO LIQD: 30.0000 mL | Freq: Two times a day (BID) | ORAL | Status: DC

## 2014-09-25 MED ORDER — POTASSIUM CHLORIDE CRYS ER 20 MEQ PO TBCR
40.0000 meq | EXTENDED_RELEASE_TABLET | Freq: Once | ORAL | Status: AC
  Administered 2014-09-25: 40 meq via ORAL
  Filled 2014-09-25: qty 2

## 2014-09-25 MED ORDER — ENSURE ENLIVE PO LIQD: 237.0000 mL | Freq: Two times a day (BID) | ORAL | Status: DC

## 2014-09-25 NOTE — Discharge Summary (Signed)
Physician Discharge Summary  Drew PavlovMarshall Sandoval WUJ:[id number] DOB: Sep 13, 1975 DOA: 09/24/2014  PCP: Drew PitterBLAND,Drew J, MD  Admit date: 09/24/2014 Discharge date: 09/25/2014   Recommendations for Outpatient Follow-Up:   1. We'll arrange for follow-up with PCP in 2 weeks to reevaluate heart rate, status post hospitalization for SVT.   Discharge Diagnosis:   Principal Problem:    Asthma exacerbation Active Problems:    Acute respiratory failure with hypoxia    Sustained SVT    Malnutrition of moderate degree    Underweight    Community-acquired pneumonia  Discharge disposition:  Home.    Discharge Condition: Improved.  Diet recommendation:  Regular.  History of Present Illness:   39 year old man with past medical history of asthma, recent asthma flare, recent diagnosis of CAP on outpatient treatment with doxycycline who was admitted for observation 09/24/14 with asthma flare requiring multiple doses of albuterol. Respiratory status was stable, but the patient developed sustained SVT with heart rate in the 160s that required cardioversion in the ED. Patient was admitted for observation overnight.   Hospital Course by Problem:   Principal Problem:   Asthma exacerbation in the setting of community-acquired pneumonia - Patient instructed to resume his course of doxycycline and his usual asthma medications at discharge. - No wheezing on exam at discharge.  Active Problems:   Acute respiratory failure with hypoxia secondary to asthma exacerbation - Respiratory status stable at discharge with no oxygen requirement.    Sustained SVT secondary to bronchodilator therapy - Resolved status post cardioversion. Monitor on telemetry overnight with no recurrence.    Malnutrition of moderate degree/nderweight - Seen by dietitian. Discharged home with instructions to continue nutritional supplements and protein supplement.  Medical Consultants:    None.   Discharge Exam:   Filed  Vitals:   09/25/14 0424  BP: 132/77  Pulse: 60  Temp: 97.9 F (36.6 C)  Resp: 18   Filed Vitals:   09/24/14 1443 09/24/14 1444 09/24/14 2014 09/25/14 0424  BP:   104/59 132/77  Pulse:   94 60  Temp:   98 F (36.7 C) 97.9 F (36.6 C)  TempSrc:   Oral Oral  Resp:   18 18  Height:  6\' 3"  (1.905 m)    Weight:  61.8 kg (136 lb 3.9 oz)    SpO2: 94%  96% 94%    Gen:  NAD Cardiovascular:  RRR, No M/R/G Respiratory: Lungs mostly clear with occasional high-pitched wheeze Gastrointestinal: Abdomen soft, NT/ND with normal active bowel sounds. Extremities: No C/E/C   The results of significant diagnostics from this hospitalization (including imaging, microbiology, ancillary and laboratory) are listed below for reference.     Procedures and Diagnostic Studies:   Dg Chest 2 View  09/24/2014   CLINICAL DATA:  Worsening shortness of breath the last few days  EXAM: CHEST  2 VIEW  COMPARISON:  09/21/2014, 07/17/2013  FINDINGS: There is interval improved aeration of the right middle lobe. There is no focal parenchymal opacity. There is no pleural effusion or pneumothorax. The heart and mediastinal contours are unremarkable.  The osseous structures are unremarkable.  IMPRESSION: No active cardiopulmonary disease.   Electronically Signed   By: Elige KoHetal  Patel   On: 09/24/2014 10:36   Dg Chest 2 View  09/21/2014   CLINICAL DATA:  Generalized body aches for a couple of days with congestion. History of asthma.  EXAM: CHEST  2 VIEW  COMPARISON:  Chest radiograph October 28, 2013  FINDINGS: RIGHT middle lobe partial collapse. Increased  lung volumes, similar to prior examination. No pleural effusion. Cardiomediastinal silhouette is unremarkable. No pneumothorax. Soft tissue planes and included osseous structures are nonsuspicious.  IMPRESSION: RIGHT middle lobe partial collapse.  Similar hyperinflation.   Electronically Signed   By: Awilda Metro   On: 09/21/2014 06:51     Labs:   Basic Metabolic  Panel:  Recent Labs Lab 09/24/14 0943  NA 140  K 3.2*  CL 109  CO2 22  GLUCOSE 110*  BUN 24*  CREATININE 0.76  CALCIUM 8.7*   GFR Estimated Creatinine Clearance: 109.4 mL/min (by C-G formula based on Cr of 0.76). Liver Function Tests: No results for input(s): AST, ALT, ALKPHOS, BILITOT, PROT, ALBUMIN in the last 168 hours. No results for input(s): LIPASE, AMYLASE in the last 168 hours. No results for input(s): AMMONIA in the last 168 hours. Coagulation profile No results for input(s): INR, PROTIME in the last 168 hours.  CBC:  Recent Labs Lab 09/24/14 0943  WBC 5.9  NEUTROABS 2.8  HGB 14.6  HCT 42.7  MCV 89.3  PLT 169   Cardiac Enzymes:  Recent Labs Lab 09/24/14 0943  TROPONINI <0.03     Discharge Instructions:   Discharge Instructions    Call MD for:    Complete by:  As directed   Racing heart, palpitations, worsening shortness of breath or chest tightness.     Diet general    Complete by:  As directed      Increase activity slowly    Complete by:  As directed             Medication List    TAKE these medications        albuterol 108 (90 BASE) MCG/ACT inhaler  Commonly known as:  PROVENTIL HFA;VENTOLIN HFA  Inhale 2 puffs into the lungs every 4 (four) hours as needed for wheezing or shortness of breath.     albuterol (2.5 MG/3ML) 0.083% nebulizer solution  Commonly known as:  PROVENTIL  Take 3 mLs (2.5 mg total) by nebulization every 6 (six) hours as needed for wheezing or shortness of breath.     budesonide-formoterol 160-4.5 MCG/ACT inhaler  Commonly known as:  SYMBICORT  2 puffs twice daily, then rinse mouth     doxycycline 100 MG capsule  Commonly known as:  VIBRAMYCIN  Take 1 capsule (100 mg total) by mouth 2 (two) times daily.     EPINEPHrine 0.3 mg/0.3 mL Soaj injection  Commonly known as:  EPIPEN  Inject into thigh for severe allergic reaction     feeding supplement (ENSURE ENLIVE) Liqd  Take 237 mLs by mouth 2 (two) times  daily between meals.     feeding supplement (PRO-STAT SUGAR FREE 64) Liqd  Take 30 mLs by mouth 2 (two) times daily.     montelukast 10 MG tablet  Commonly known as:  SINGULAIR  TAKE 1 TABLET BY MOUTH AT BEDTIME     predniSONE 20 MG tablet  Commonly known as:  DELTASONE  Take 3 tablets (60 mg total) by mouth daily with breakfast.     traMADol 50 MG tablet  Commonly known as:  ULTRAM  1 every 8 hours if needed for cough           Follow-up Information    Follow up with Drew Pitter, MD. Go on 10/08/2014.   Specialty:  Family Medicine   Why:  at 5:00pm  For Post Hospitalization Follow Up and SVT.  , Bring a list of medications or bottles.  Bring your Discharge instructions to the appt.,   Contact information:   [address] [place] [address] [phone]        Time coordinating discharge: 25 minutes.  Signed:  Erma Raiche  Pager [phone] [facility] 09/25/2014, 11:05 AM

## 2014-09-25 NOTE — Progress Notes (Signed)
Pt refused AM labs

## 2014-09-25 NOTE — Discharge Instructions (Signed)

## 2014-09-25 NOTE — Progress Notes (Signed)
D/C instructions reviewed w/ pt. Pt verbalizes understanding and all questions answered. Pt d/c in w/c in stable condition by NT to his own car. Pt in possession of d/c instructions and all personal belongings. Pt aware of need to pick up script at pharmacy on the way home.

## 2014-09-27 ENCOUNTER — Ambulatory Visit (INDEPENDENT_AMBULATORY_CARE_PROVIDER_SITE_OTHER): Payer: BLUE CROSS/BLUE SHIELD

## 2014-09-27 DIAGNOSIS — J309 Allergic rhinitis, unspecified: Secondary | ICD-10-CM | POA: Diagnosis not present

## 2014-10-01 ENCOUNTER — Ambulatory Visit (INDEPENDENT_AMBULATORY_CARE_PROVIDER_SITE_OTHER): Payer: BLUE CROSS/BLUE SHIELD

## 2014-10-01 DIAGNOSIS — J309 Allergic rhinitis, unspecified: Secondary | ICD-10-CM

## 2014-10-04 ENCOUNTER — Ambulatory Visit (INDEPENDENT_AMBULATORY_CARE_PROVIDER_SITE_OTHER): Payer: BLUE CROSS/BLUE SHIELD

## 2014-10-04 DIAGNOSIS — J309 Allergic rhinitis, unspecified: Secondary | ICD-10-CM

## 2014-10-07 DIAGNOSIS — H1031 Unspecified acute conjunctivitis, right eye: Secondary | ICD-10-CM | POA: Insufficient documentation

## 2014-10-07 HISTORY — DX: Unspecified acute conjunctivitis, right eye: H10.31

## 2014-10-07 NOTE — Assessment & Plan Note (Signed)
Variable control, steroid dependent, atopic with elevated IgE and eosinophil levels. IgE was too high for Xolair

## 2014-10-07 NOTE — Assessment & Plan Note (Signed)
Waking with unilateral conjunctivitis 7 hours after an allergy shot, is probably not related to the allergy shot. I suspect he scratched his eye somehow. Not obviously infected but if there is any question he is advised to see an ophthalmologist. Plan-recommended bland artificial tears

## 2014-10-07 NOTE — Assessment & Plan Note (Signed)
Building allergy vaccine after restart, [facility] Plan-continue building allergy vaccine with discussion

## 2014-10-07 NOTE — Assessment & Plan Note (Signed)
His IgE was always too high for Xolair shots but he might be a candidate for Nucala Plan-continue daily Medrol with risk benefit, continue routine meds

## 2014-10-08 ENCOUNTER — Ambulatory Visit (INDEPENDENT_AMBULATORY_CARE_PROVIDER_SITE_OTHER): Payer: BLUE CROSS/BLUE SHIELD

## 2014-10-08 DIAGNOSIS — J309 Allergic rhinitis, unspecified: Secondary | ICD-10-CM | POA: Diagnosis not present

## 2014-10-09 ENCOUNTER — Encounter: Payer: Self-pay | Admitting: *Deleted

## 2014-10-09 ENCOUNTER — Ambulatory Visit (INDEPENDENT_AMBULATORY_CARE_PROVIDER_SITE_OTHER): Payer: BLUE CROSS/BLUE SHIELD | Admitting: Internal Medicine

## 2014-10-09 ENCOUNTER — Encounter: Payer: Self-pay | Admitting: Internal Medicine

## 2014-10-09 VITALS — BP 116/74 | HR 71 | Ht 75.0 in | Wt 139.0 lb

## 2014-10-09 DIAGNOSIS — I471 Supraventricular tachycardia: Secondary | ICD-10-CM

## 2014-10-09 DIAGNOSIS — D721 Eosinophilia, unspecified: Secondary | ICD-10-CM

## 2014-10-09 DIAGNOSIS — J4550 Severe persistent asthma, uncomplicated: Secondary | ICD-10-CM

## 2014-10-09 NOTE — Patient Instructions (Addendum)
I will be looking into whether you might be a candidate for a new drug for allergic asthma called Nucala.  Finish the prednisone taper you are on now, and continue the regular medicines and allergy shots.  Order- Office spriometry   Dx asthma, severe, poorly controlled  Ordered PFT after visit.

## 2014-10-09 NOTE — Progress Notes (Signed)
Patient ID: Drew Sandoval, male    DOB: Oct 02, 1975    MRN: [id number]  HPI 11/04/10- 37 yobm former smoker followed for severe asthma, chronic sinusitis, hype-reosinophilia and hyper IgE  Sent to Dr [name]  by Dr [name] for allergy evaluation. 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 Allergy profile- 08/11/10 IgE 1,747 with broad specific elevation Last here- August 31, 2010.- note reviewed. Since last here has been in hospital again, getting out 2 weeks ago. Feels well today.  Comes today as planned for allergy skin testing Skin test: Strongly positive especially grass, tree, dust mite  01/04/11- 39 yo former smoker followed for severe asthma, chronic sinusitis, hyper-eosinophilia and hyper IgE.  wife is with him  .Allergy vaccine was started 11/09/2010 and is building without problems He was still on prednisone when he saw Dr [name] recently and at that time he seemed improved after a recent exacerbation- 2 weeks ago. About 2 weeks after their visit he began coughing again. He hasn't noted any unusual exposure at work, or any sense that he had a cold.  His last IgE was 1747.  He doesn't feel "sick"- just tight and wheezy.  02/18/11-  39 yo former smoker followed for severe asthma, chronic sinusitis, hyper-eosinophilia and hyper IgE.  wife is with him Cooler weather is more comfortable for him. He continues to build allergy vaccine, now at 1:500, without problems. Some days continued to be better than others. 2 days ago thought his eyes were starting to itch and took Benadryl. We discussed comparison antihistamines. Not much sneezing. He has not needed his rescue inhaler in a long time but continues prednisone 20 mg daily. We educated on prednisone again.  04/20/11- 39 yo former smoker followed for severe asthma, chronic sinusitis, hyper-eosinophilia and hyper IgE.  wife is with him Went to ER 3 days ago- got neb and prednisone 60 mg x1. He finished  our last taper yesterday. Has had flu shot. Bending over, long walks or climbing stairs, and long work shifts all make him short of breath with wheezing. He worked last night and now feels "65%". He does not think he has an acute cold or infection. He was last really well about 5 days ago. He denies heartburn. He continues building allergy vaccine now at 1:50, without problems. IgE level has been out of range for Xolair.  07/19/11- 39 yo former smoker followed for severe asthma, chronic sinusitis, hyper-eosinophilia and hyper IgE. Separated from wife have now living with his mother. Asthma control is "a whole lot better". He has not missed work. Did have to go to emergency room for kidney stone. Questions allergy vaccine causing "dark spots" on his neck. Using prednisone only occasionally. No routine wheeze, does short of breath at times.  11/18/11- 39 yo former smoker followed for severe asthma, chronic sinusitis, hyper-eosinophilia and hyper IgE. Went to ER for approximately 4 asthma attacks in the past month; He has continued allergy vaccine at 1:50. He is needed his nebulizer 2 or 3 times daily and occasional rescue inhaler. He denies infection or change in workplace exposure. He does not recognize reflux. He usually flares as soon as he finishes the prednisone taper so he and his wife asked about maintenance prednisone which we discussed. Carefully. Sinus congestion tends to parallel his asthma episodes but he denies purulent nasal discharge or headache. We again discussed the significance of his elevated IgE and eosinophil levels. His IgE has been  too high for Xolair but we will reassess. CXR 11/11/11-  IMPRESSION: No active disease. Hyperinflation again noted.  Original Report Authenticated By: Natasha MeadLIVIU POP, M.D.   02/07/12- 39 yo former smoker followed for severe asthma, chronic sinusitis, hyper-eosinophilia and hyper IgE. FOLLOWS FOR: [facility] through  02/04/2012. DC summary reviewed. Discharge diagnosis asthma. Asthma began during the night. He took his own nebulizer, felt better and tried to go to work but had to leave. He had presented [facility] where he was treated with 3 nebulizer treatments and sent out. [facility]. Discharged on a steroid taper. They comment in the summary that he was significantly depressed because he had watched his father died of asthma. He admits today that he had not told us of that history before. He feels better now but still wheezing and washed out as he tapers prednisone. He denies any sense of reflux or any recognized triggering experience. In the past he had thought there was something associated with his work place as discussed in earlier notes. He comments today that he can't breathe if he sleeps on his left side. There is no history of heart disease. He quit allergy vaccine in April, 2013.  11/19/13- 39 yo former smoker followed for severe asthma, chronic sinusitis, hyper-eosinophilia and hyper IgE- too high for Xolair.  FOLLOWS FOR:  Reports has good and bad days with breathing.  When having bad day, sob, wheezing and cough non-productive.  Hasn't done Allergy vaccine in 3-4 months due to no ride here to have done Hospital admission in March, emergency room visits in May and June because of asthma/cough. He denies any awareness of reflux, adenopathy, post nasal drainage. Cough is dry or scant white sputum. Has cough syrup at night but Tessalon does not help and daytime. He feels tight today. Last prednisone several weeks ago. Easy dyspnea on exertion without chest pain, palpitation, edema. 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 CXR 10/28/13- IMPRESSION:  Hyperinflation, otherwise negative. No change from prior study.  Electronically Signed  rec Continue routine meds Script for tramadol to try for  daytime cough You can also use an otc cough med like Delsym in between doses of tramadol if needed Script to try theophylline for breathing and cough. Take this med regularly, 1 twice daily after a meal, with food in your stomach. If it too strong, break it in half and take 1/2 tab twice daily We are stopping allergy shots for now, since you can't get down here easily.   12/10/2013 f/u ov/Wert re:  Severe asthma/ hyper E sp smoking cessation 2011/ 30 mg pred per day  Chief Complaint  Patient presents with  . Follow-up    Pt c/o wheezing with exertion, sometimes prod cough with clear mucus X3 months.  Recently saw CY for this, is being seen today to get a work release note.    breathing fine now and not needing rescue, typical of how he feels on prednisone, no better on theoph in terms of sob or need for saba No cough meds during the day, tramdol not helpful, does fine p tussionex, produces clear mucus each am.  No obvious day to day or daytime variabilty or assoc  cp or chest tightness, subjective wheeze overt sinus or hb symptoms. No unusual exp hx or h/o childhood pna/ asthma or knowledge of premature  birth. Sleeping ok p tussionex without nocturnal  or early am exacerbation  of respiratory  c/o's or need for noct saba. Also denies any obvious fluctuation of symptoms with weather or environmental changes or other aggravating or alleviating factors except as outlined above   01/24/14- 38 yoM former smoker followed for severe asthma, chronic sinusitis, hyper-eosinophilia and hyper IgE- too high for Xolair.  FOLLOWS FOR: Pt last seen by MW on 12/10/13. Pt has stopped with his vaccine for 1 year but wants to start up again d/t itchy eyes, runny nose, prod cough. Pt denies SOB and CP/tightness.   03/22/14- [age] yoM former smoker followed for severe asthma, chronic sinusitis, hyper-eosinophilia and hyper IgE- too high for Xolair FOLLOWS FOR: had attack yesterday. Pt states he lets his breathing get bad. Like  Symbicort better than Dulera and needs RX.We called in Pred taper yesterday.  Had flu vaccine. 3 weeks ago asthma began getting bad and really flared yesterday. Blames smell of burning leaves outdoors. Doesn't think he has a cold. Admits he let himself get out of hand before calling. Started prednisone last night and today feels much better.  07/17/14- 38 yoM former smoker followed for severe asthma, chronic sinusitis, hyper-eosinophilia and hyper IgE- too high for Xolair FOLLOWS FOR: Pt had attack last week-was given Prednisone; pt feels m-pred helps better. Also, patient needs refill for Singulair and Tessalon. Discuss allergy injections. Wants to try allergy shots again now that he has insurance to cover it. Blames recent exacerbation on weather change. Noticing increased sinus drainage without sneeze or headache.  09/19/14- 38 yoM former smoker followed for severe asthma, chronic sinusitis, hyper-eosinophilia and hyper IgE- too high for Xolair Follows For: Asthma is well controlled. C/o dry cough. After allergy shot in 09/17/14 right eye became swollen, red and tender.   09/24/14-  [age] yoM former smoker followed for severe asthma, chronic sinusitis, hyper-eosinophilia and hyper IgE- too high for Xolair ACUTE VISIT: Flare up-cant walk 5 steps without getting SOB. Went to [facility] Sunday fpor asthma flare, with myalgias-currently on 60 mg of Prednisone.  Patient arrived at this office today for allergy shot. Noted weak, dyspneic, tachycardia. EKG 09/24/14 shows regular SVT at 160.  CXR 09/21/14- I reviewed image and agree IMPRESSION: RIGHT middle lobe partial collapse. Similar hyperinflation. Electronically Signed  By: Awilda Metroourtnay Bloomer  On: 09/21/2014 06:51  10/09/14- 38 yoM former smoker followed for severe asthma, chronic sinusitis, hyper-eosinophilia and hyper IgE- too high for Xolair, hx SVT Reports: pt. still able to walk the 5 steps without getting SOB,wheezing a couple days ago,        Wife here Hosp f/u:5/17-5/18- exac asthma and SVT requiring cardioversion Now feels well, no wheeze on prednisone. Reviewed labs- Eos 14%,absolute 1.0( 0.0-0.7) Continues allergy vaccine building at 1:[facility] 10/09/14- Severe obstructive disease- FVC 2.63/ 52%, FEV1 0.97/ 24%, FEV1/FVC 0.37, FEF25-75% 0.36/ 8% Eosinophils 14% Allergy profile done 07/17/2014 showed very high total IgE 3283  ROS-see HPI   Negative unless "+" Constitutional:    weight loss, night sweats, fevers, chills,+ fatigue, lassitude. HEENT:    headaches, difficulty swallowing, tooth/dental problems, sore throat,       sneezing, itching, ear ache, +nasal congestion, post nasal drip, snoring CV:    chest pain, orthopnea, PND, swelling in lower extremities, anasarca,  dizziness, palpitations Resp:   +shortness of breath with exertion or at rest.                productive cough,   +non-productive cough, coughing up of blood.              change in color of mucus.  +wheezing.   Skin:    rash or lesions. GI:  No-   heartburn, indigestion, abdominal pain, nausea, vomiting,  GU: d. MS:   joint pain, stiffness, decreased range of motion, back pain. Neuro-     nothing unusual Psych:  change in mood or affect.  depression or anxiety.   memory loss.  Objective:   Physical Exam General- Alert, Oriented, Affect-uncomfortable, near tears, Distress+ acute;  Tall, thin Skin- clear Lymphadenopathy- none Head- atraumatic            Eyes- Gross vision intact, PERRLA, conjunctivae clear secretions            Ears- Hearing, canals normal            Nose- Clear, no-Septal dev, mucus, polyps, erosion, perforation             Throat- Mallampati II , mucosa clear , drainage- none, tonsils- atrophic Neck- flexible , trachea midline, no stridor , thyroid nl, carotid no bruit Chest - symmetrical excursion , unlabored           Heart/CV- RRR rapid , no murmur , no gallop  , no rub, nl s1 s2                            - JVD- none , edema- none, stasis changes- none, varices- none           Lung-   diminished, unlabored,  Cough+ light , dullness-none, rub- none,  Wheeze+faint Abd- scaphoid  Br/ Gen/ Rectal- Not done, not indicated Extrem- cyanosis- none, clubbing, none, atrophy- none, strength- nl Neuro- +slight tremor

## 2014-10-11 ENCOUNTER — Ambulatory Visit (INDEPENDENT_AMBULATORY_CARE_PROVIDER_SITE_OTHER): Payer: BLUE CROSS/BLUE SHIELD

## 2014-10-11 DIAGNOSIS — J309 Allergic rhinitis, unspecified: Secondary | ICD-10-CM | POA: Diagnosis not present

## 2014-10-11 NOTE — Assessment & Plan Note (Signed)
Stress of dyspnea plus heavy sympathetic bronchodilator use for probable trigger for SVT at last visit, requiring cardioversion at hospital. He denies palpitations now.

## 2014-10-11 NOTE — Assessment & Plan Note (Signed)
He is now improved, probably to baseline, with a significant fixed obstructive airways component, on prednisone. His IgE was too high for Xolair. Plan- Consider Nucala

## 2014-10-11 NOTE — Assessment & Plan Note (Signed)
Consider Nucala

## 2014-10-15 ENCOUNTER — Ambulatory Visit (INDEPENDENT_AMBULATORY_CARE_PROVIDER_SITE_OTHER): Payer: BLUE CROSS/BLUE SHIELD

## 2014-10-15 DIAGNOSIS — J309 Allergic rhinitis, unspecified: Secondary | ICD-10-CM | POA: Diagnosis not present

## 2014-10-18 ENCOUNTER — Ambulatory Visit (INDEPENDENT_AMBULATORY_CARE_PROVIDER_SITE_OTHER): Payer: BLUE CROSS/BLUE SHIELD

## 2014-10-18 DIAGNOSIS — J309 Allergic rhinitis, unspecified: Secondary | ICD-10-CM | POA: Diagnosis not present

## 2014-10-21 ENCOUNTER — Telehealth: Payer: Self-pay | Admitting: Internal Medicine

## 2014-10-21 NOTE — Telephone Encounter (Signed)
Patient requesting work note for Friday.    Work note written and given to Dr. Maple Hudson to sign.  Signed by Dr. Maple Hudson and given to patient. Nothing further needed.

## 2014-10-22 ENCOUNTER — Ambulatory Visit (INDEPENDENT_AMBULATORY_CARE_PROVIDER_SITE_OTHER): Payer: BLUE CROSS/BLUE SHIELD

## 2014-10-22 DIAGNOSIS — J309 Allergic rhinitis, unspecified: Secondary | ICD-10-CM

## 2014-10-25 ENCOUNTER — Ambulatory Visit (INDEPENDENT_AMBULATORY_CARE_PROVIDER_SITE_OTHER): Payer: BLUE CROSS/BLUE SHIELD

## 2014-10-25 ENCOUNTER — Telehealth: Payer: Self-pay | Admitting: Internal Medicine

## 2014-10-25 DIAGNOSIS — J309 Allergic rhinitis, unspecified: Secondary | ICD-10-CM | POA: Diagnosis not present

## 2014-10-25 NOTE — Telephone Encounter (Signed)
Date Mixed: 10/25/2014 Vial: AB Strength: 1:50 Here/Mail/Pick Up: Here Mixed By: Jaynee Eagles, CMA

## 2014-10-27 ENCOUNTER — Emergency Department (HOSPITAL_COMMUNITY): Payer: BLUE CROSS/BLUE SHIELD

## 2014-10-27 ENCOUNTER — Inpatient Hospital Stay (HOSPITAL_COMMUNITY)
Admission: EM | Admit: 2014-10-27 | Discharge: 2014-10-29 | DRG: 202 | Disposition: A | Discharge status: Home / Self Care | Payer: BLUE CROSS/BLUE SHIELD | Attending: Internal Medicine | Admitting: Internal Medicine

## 2014-10-27 ENCOUNTER — Encounter (HOSPITAL_COMMUNITY): Payer: Self-pay | Admitting: *Deleted

## 2014-10-27 DIAGNOSIS — R0602 Shortness of breath: Secondary | ICD-10-CM | POA: Diagnosis not present

## 2014-10-27 DIAGNOSIS — J45902 Unspecified asthma with status asthmaticus: Principal | ICD-10-CM | POA: Diagnosis present

## 2014-10-27 DIAGNOSIS — Z825 Family history of asthma and other chronic lower respiratory diseases: Secondary | ICD-10-CM

## 2014-10-27 DIAGNOSIS — J45901 Unspecified asthma with (acute) exacerbation: Secondary | ICD-10-CM | POA: Diagnosis present

## 2014-10-27 DIAGNOSIS — R0902 Hypoxemia: Secondary | ICD-10-CM

## 2014-10-27 DIAGNOSIS — Z801 Family history of malignant neoplasm of trachea, bronchus and lung: Secondary | ICD-10-CM

## 2014-10-27 DIAGNOSIS — J9601 Acute respiratory failure with hypoxia: Secondary | ICD-10-CM | POA: Diagnosis present

## 2014-10-27 DIAGNOSIS — Z87891 Personal history of nicotine dependence: Secondary | ICD-10-CM

## 2014-10-27 MED ORDER — ALBUTEROL (5 MG/ML) CONTINUOUS INHALATION SOLN
15.0000 mg/h | INHALATION_SOLUTION | Freq: Once | RESPIRATORY_TRACT | Status: AC
  Administered 2014-10-27: 15 mg/h via RESPIRATORY_TRACT

## 2014-10-27 MED ORDER — METHYLPREDNISOLONE SODIUM SUCC 125 MG IJ SOLR
125.0000 mg | Freq: Once | INTRAMUSCULAR | Status: AC
  Administered 2014-10-27: 125 mg via INTRAVENOUS
  Filled 2014-10-27: qty 2

## 2014-10-27 MED ORDER — SODIUM CHLORIDE 0.9 % IV SOLN
INTRAVENOUS | Status: DC
  Administered 2014-10-27: via INTRAVENOUS

## 2014-10-27 MED ORDER — IPRATROPIUM-ALBUTEROL 0.5-2.5 (3) MG/3ML IN SOLN
3.0000 mL | Freq: Once | RESPIRATORY_TRACT | Status: DC
  Filled 2014-10-27: qty 3

## 2014-10-27 MED ORDER — ALBUTEROL (5 MG/ML) CONTINUOUS INHALATION SOLN
INHALATION_SOLUTION | RESPIRATORY_TRACT | Status: AC
  Filled 2014-10-27: qty 20

## 2014-10-27 MED ORDER — IPRATROPIUM BROMIDE 0.02 % IN SOLN
RESPIRATORY_TRACT | Status: AC
  Administered 2014-10-27: 0.5 mg
  Filled 2014-10-27: qty 2.5

## 2014-10-27 NOTE — ED Notes (Signed)
Pt c/o sob that has gotten worse over the last hour; pt c/o left side pain

## 2014-10-27 NOTE — ED Provider Notes (Signed)
CSN: [id number]     Arrival date & time 10/27/14  2312 History  This chart was scribed for Drew Albe, MD, by Octavia Heir, ED Scribe. This patient was seen in room APA19/APA19 and the patient's care was started at 11:33 PM.     Chief Complaint  Patient presents with  . Shortness of Breath      The history is provided by the patient. No language interpreter was used.    HPI Comments: Drew Sandoval is a 39 y.o. male who has hx of asthma presents to the Emergency Department complaining of gradual worsening shortness of breath onset 10 hours ago Pt has associated wheezing, productive cough with yellow mucus and left lower lateral chest pain. He notes his chest pain is sharp and pleuritic. Pt denies rhinorrhea and fever. Pt tried nebulizer and inhalers to alleviate the symptoms with no relief today. He notes having a collapsed lung a few years ago but notes this does not feel like that. Pt is a former smoker.  PCP Dr Lowella Bandy Pulmonary Dr Maple Hudson  Past Medical History  Diagnosis Date  . Asthma   . Seasonal allergies    Past Surgical History  Procedure Laterality Date  . None     Family History  Problem Relation Age of Onset  . Emphysema Father   . Lung cancer Mother    History  Substance Use Topics  . Smoking status: Former Smoker -- 0.50 packs/day for 11.5 years    Types: Cigarettes    Quit date: 04/09/2010  . Smokeless tobacco: Never Used     Comment: started at age 11.  1/2 ppd.    . Alcohol Use: No  employed  Review of Systems  Constitutional: Negative for fever.  HENT: Negative for rhinorrhea.   Respiratory: Positive for cough, shortness of breath and wheezing.   Cardiovascular: Positive for chest pain.  All other systems reviewed and are negative.     Allergies  Azithromycin; Alvesco; Anoro ellipta; Banana; and Penicillins  Home Medications   Prior to Admission medications   Medication Sig Start Date End Date Taking? Authorizing Provider  albuterol  (PROVENTIL HFA;VENTOLIN HFA) 108 (90 BASE) MCG/ACT inhaler Inhale 2 puffs into the lungs every 4 (four) hours as needed for wheezing or shortness of breath. 04/29/14 04/29/15 Yes [name], MD  albuterol (PROVENTIL) (2.5 MG/3ML) 0.083% nebulizer solution Take 3 mLs (2.5 mg total) by nebulization every 6 (six) hours as needed for wheezing or shortness of breath. 05/13/14  Yes [name], MD  budesonide-formoterol [facility]) 160-4.5 MCG/ACT inhaler 2 puffs twice daily, then rinse mouth 03/22/14  Yes [name], MD  EPINEPHrine (EPIPEN) 0.3 mg/0.3 mL SOAJ injection Inject into thigh for severe allergic reaction 03/05/13  Yes [name], MD  feeding supplement, ENSURE ENLIVE, (ENSURE ENLIVE) LIQD Take 237 mLs by mouth 2 (two) times daily between meals. 09/25/14  Yes Maryruth Bun Rama, MD  montelukast (SINGULAIR) 10 MG tablet TAKE 1 TABLET BY MOUTH AT BEDTIME 07/17/14  Yes [name], MD  traMADol (ULTRAM) 50 MG tablet 1 every 8 hours if needed for cough 09/09/14  Yes [name], MD   Triage vitals: BP 133/97 mmHg  Pulse 72  Temp(Src) 97.8 F (36.6 C) (Oral)  Resp 24  Ht 6\' 3"  (1.905 m)  Wt 143 lb (64.864 kg)  BMI 17.87 kg/m2  SpO2 92%  Vital signs normal except for hypoxia. During my interview his pulse ox very between 89-91% on room  air.  Physical Exam  Constitutional: He is oriented to person, place, and time. He appears well-developed and well-nourished.  Non-toxic appearance. He does not appear ill. No distress.  Tall and thin   HENT:  Head: Normocephalic and atraumatic.  Right Ear: External ear normal.  Left Ear: External ear normal.  Nose: Nose normal. No mucosal edema or rhinorrhea.  Mouth/Throat: Oropharynx is clear and moist and mucous membranes are normal. No dental abscesses or uvula swelling.  Eyes: Conjunctivae and EOM are normal. Pupils are equal, round, and reactive to light.  Neck: Normal range of motion and full passive range of motion without pain.  Neck supple.  Cardiovascular: Normal rate, regular rhythm and normal heart sounds.  Exam reveals no gallop and no friction rub.   No murmur heard. Pulmonary/Chest: Accessory muscle usage present. Tachypnea noted. He is in respiratory distress. He has wheezes. He has no rhonchi. He has no rales. He exhibits no tenderness and no crepitus.    Diffused low pitched wheezes, area of chest pain noted.   Abdominal: Soft. Normal appearance and bowel sounds are normal. He exhibits no distension. There is no tenderness. There is no rebound and no guarding.  Musculoskeletal: Normal range of motion. He exhibits no edema or tenderness.  Moves all extremities well.   Neurological: He is alert and oriented to person, place, and time. He has normal strength. No cranial nerve deficit.  Skin: Skin is warm, dry and intact. No rash noted. No erythema. No pallor.  Psychiatric: He has a normal mood and affect. His speech is normal and behavior is normal. His mood appears not anxious.  Nursing note and vitals reviewed.   ED Course  Procedures   Medications  0.9 %  sodium chloride infusion ( Intravenous New Bag/Given 10/27/14 2351)  albuterol (PROVENTIL,VENTOLIN) solution continuous neb (15 mg/hr Nebulization Given 10/27/14 2342)  methylPREDNISolone sodium succinate (SOLU-MEDROL) 125 mg/2 mL injection 125 mg (125 mg Intravenous Given 10/27/14 2355)  ipratropium (ATROVENT) 0.02 % nebulizer solution (0.5 mg  Given 10/27/14 2343)  albuterol (PROVENTIL, VENTOLIN) (5 MG/ML) 0.5% continuous inhalation solution (  Duplicate 10/27/14 2342)  albuterol (PROVENTIL) (2.5 MG/3ML) 0.083% nebulizer solution 15 mg (15 mg Nebulization Given 10/28/14 0044)  magnesium sulfate IVPB 2 g 50 mL (0 g Intravenous Stopped 10/28/14 0149)    DIAGNOSTIC STUDIES: Oxygen Saturation is 92% on RA, low by my interpretation.  COORDINATION OF CARE:  11:37 PM Discussed treatment plan which includes IV, hour long breathing treatment, x-ray with pt at  bedside and pt agreed to plan. He was given IV steroids.  12:30 AM patient was rechecked after his continuous nebulizer. Patient is in more respiratory distress. His pulse ox drops down to 83% on nasal cannula. I placed him on a nonrebreather mask and his pulse ox improved to 98%. He started breathing easier. We discussed starting him on BiPAP which he states he's done before. A second continuous nebulizer was ordered and he was ordered to get IV magnesium. BiPap was ordered. Patient's chest x-ray has not been read by radiology yet, however when I look at it I do not see any obvious pneumothorax.  01:00 pt is doing better with the NRM, his pulse ox is 100%.   01:30 pt sleeping, pulse ox 100%, has finished his second continuous nebulizer. Will get VBG, pt is agreeable to being admitted. Respiratory did not start the BiPap, machine still at bedside.   02:25 Dr Mort Sawyers, admit to step down   Labs Review Results  for orders placed or performed during the hospital encounter of 10/27/14  Basic metabolic panel  Result Value Ref Range   Sodium 143 135 - 145 mmol/L   Potassium 3.7 3.5 - 5.1 mmol/L   Chloride 108 101 - 111 mmol/L   CO2 28 22 - 32 mmol/L   Glucose, Bld 80 65 - 99 mg/dL   BUN 14 6 - 20 mg/dL   Creatinine, Ser 0.45 0.61 - 1.24 mg/dL   Calcium 8.3 (L) 8.9 - 10.3 mg/dL   GFR calc non Af Amer >60 >60 mL/min   GFR calc Af Amer >60 >60 mL/min   Anion gap 7 5 - 15  CBC with Differential  Result Value Ref Range   WBC 9.3 4.0 - 10.5 K/uL   RBC 4.80 4.22 - 5.81 MIL/uL   Hemoglobin 14.7 13.0 - 17.0 g/dL   HCT 40.9 81.1 - 91.4 %   MCV 91.9 78.0 - 100.0 fL   MCH 30.6 26.0 - 34.0 pg   MCHC 33.3 30.0 - 36.0 g/dL   RDW 78.2 95.6 - 21.3 %   Platelets 197 150 - 400 K/uL   Neutrophils Relative % 37 (L) 43 - 77 %   Neutro Abs 3.4 1.7 - 7.7 K/uL   Lymphocytes Relative 40 12 - 46 %   Lymphs Abs 3.7 0.7 - 4.0 K/uL   Monocytes Relative 4 3 - 12 %   Monocytes Absolute 0.4 0.1 - 1.0 K/uL    Eosinophils Relative 18 (H) 0 - 5 %   Eosinophils Absolute 1.7 (H) 0.0 - 0.7 K/uL   Basophils Relative 1 0 - 1 %   Basophils Absolute 0.1 0.0 - 0.1 K/uL  Blood gas, venous  Result Value Ref Range   FIO2 0.21 %   pH, Ven 7.307 (H) 7.250 - 7.300   pCO2, Ven 57.1 (H) 45.0 - 50.0 mmHg   pO2, Ven 44.2 30.0 - 45.0 mmHg   Bicarbonate 27.7 (H) 20.0 - 24.0 mEq/L   TCO2 24.8 0 - 100 mmol/L   Acid-Base Excess 2.0 0.0 - 2.0 mmol/L   O2 Saturation 73.5 %   Patient temperature 37.0    Drawn by COLLECTED BY NURSE    Sample type VENOUS    Laboratory interpretation all normal with mild respiratory acidosis    Imaging Review Dg Chest Port 1 View  10/28/2014   CLINICAL DATA:  Acute onset of shortness of breath, wheezing and productive cough. Left lower lateral chest pain. Initial encounter.  EXAM: PORTABLE CHEST - 1 VIEW  COMPARISON:  Chest radiograph performed 09/24/2014  FINDINGS: The lungs are well-aerated and clear. There is no evidence of focal opacification, pleural effusion or pneumothorax.  The cardiomediastinal silhouette is within normal limits. No acute osseous abnormalities are seen.  IMPRESSION: No acute cardiopulmonary process seen.   Electronically Signed   By: Roanna Raider M.D.   On: 10/28/2014 01:14     EKG Interpretation None      MDM   Final diagnoses:  Asthma with status asthmaticus in adult  Hypoxia    Plan admission  CRITICAL CARE Performed by: Drew Sandoval L Total critical care time: 46 min Critical care time was exclusive of separately billable procedures and treating other patients. Critical care was necessary to treat or prevent imminent or life-threatening deterioration. Critical care was time spent personally by me on the following activities: development of treatment plan with patient and/or surrogate as well as nursing, discussions with consultants, evaluation of patient's response to treatment,  examination of patient, obtaining history from patient or  surrogate, ordering and performing treatments and interventions, ordering and review of laboratory studies, ordering and review of radiographic studies, pulse oximetry and re-evaluation of patient's condition.   I personally performed the services described in this documentation, which was scribed in my presence. The recorded information has been reviewed and considered.  Drew Albe, MD, Concha Pyo, MD 10/28/14 [id number]

## 2014-10-28 ENCOUNTER — Encounter (HOSPITAL_COMMUNITY): Payer: Self-pay | Admitting: *Deleted

## 2014-10-28 DIAGNOSIS — Z87891 Personal history of nicotine dependence: Secondary | ICD-10-CM | POA: Diagnosis not present

## 2014-10-28 DIAGNOSIS — Z801 Family history of malignant neoplasm of trachea, bronchus and lung: Secondary | ICD-10-CM | POA: Diagnosis not present

## 2014-10-28 DIAGNOSIS — Z825 Family history of asthma and other chronic lower respiratory diseases: Secondary | ICD-10-CM | POA: Diagnosis not present

## 2014-10-28 DIAGNOSIS — J45901 Unspecified asthma with (acute) exacerbation: Secondary | ICD-10-CM | POA: Diagnosis not present

## 2014-10-28 DIAGNOSIS — J9601 Acute respiratory failure with hypoxia: Secondary | ICD-10-CM

## 2014-10-28 DIAGNOSIS — R0902 Hypoxemia: Secondary | ICD-10-CM | POA: Insufficient documentation

## 2014-10-28 DIAGNOSIS — R0602 Shortness of breath: Secondary | ICD-10-CM | POA: Diagnosis present

## 2014-10-28 DIAGNOSIS — J309 Allergic rhinitis, unspecified: Secondary | ICD-10-CM | POA: Diagnosis not present

## 2014-10-28 DIAGNOSIS — J45902 Unspecified asthma with status asthmaticus: Secondary | ICD-10-CM | POA: Diagnosis present

## 2014-10-28 LAB — CBC WITH DIFFERENTIAL/PLATELET
BASOS PCT: 1 % (ref 0–1)
Basophils Absolute: 0.1 10*3/uL (ref 0.0–0.1)
Eosinophils Absolute: 1.7 10*3/uL — ABNORMAL HIGH (ref 0.0–0.7)
Eosinophils Relative: 18 % — ABNORMAL HIGH (ref 0–5)
HCT: 44.1 % (ref 39.0–52.0)
HEMOGLOBIN: 14.7 g/dL (ref 13.0–17.0)
LYMPHS ABS: 3.7 10*3/uL (ref 0.7–4.0)
Lymphocytes Relative: 40 % (ref 12–46)
MCH: 30.6 pg (ref 26.0–34.0)
MCHC: 33.3 g/dL (ref 30.0–36.0)
MCV: 91.9 fL (ref 78.0–100.0)
MONO ABS: 0.4 10*3/uL (ref 0.1–1.0)
Monocytes Relative: 4 % (ref 3–12)
Neutro Abs: 3.4 10*3/uL (ref 1.7–7.7)
Neutrophils Relative %: 37 % — ABNORMAL LOW (ref 43–77)
Platelets: 197 10*3/uL (ref 150–400)
RBC: 4.8 MIL/uL (ref 4.22–5.81)
RDW: 13.6 % (ref 11.5–15.5)
WBC: 9.3 10*3/uL (ref 4.0–10.5)

## 2014-10-28 LAB — BASIC METABOLIC PANEL
ANION GAP: 7 (ref 5–15)
ANION GAP: 7 (ref 5–15)
BUN: 13 mg/dL (ref 6–20)
BUN: 14 mg/dL (ref 6–20)
CALCIUM: 8 mg/dL — AB (ref 8.9–10.3)
CHLORIDE: 108 mmol/L (ref 101–111)
CO2: 27 mmol/L (ref 22–32)
CO2: 28 mmol/L (ref 22–32)
Calcium: 8.3 mg/dL — ABNORMAL LOW (ref 8.9–10.3)
Chloride: 108 mmol/L (ref 101–111)
Creatinine, Ser: 0.88 mg/dL (ref 0.61–1.24)
Creatinine, Ser: 0.89 mg/dL (ref 0.61–1.24)
GFR calc Af Amer: >60 mL/min (ref >60)
GFR calc Af Amer: >60 mL/min (ref >60)
GFR calc non Af Amer: >60 mL/min (ref >60)
GFR calc non Af Amer: >60 mL/min (ref >60)
GLUCOSE: 145 mg/dL — AB (ref 65–99)
GLUCOSE: 80 mg/dL (ref 65–99)
POTASSIUM: 3.7 mmol/L (ref 3.5–5.1)
POTASSIUM: 4 mmol/L (ref 3.5–5.1)
SODIUM: 142 mmol/L (ref 135–145)
Sodium: 143 mmol/L (ref 135–145)

## 2014-10-28 LAB — BLOOD GAS, VENOUS
ACID-BASE EXCESS: 2 mmol/L (ref 0.0–2.0)
Bicarbonate: 27.7 mEq/L — ABNORMAL HIGH (ref 20.0–24.0)
FIO2: 0.21 %
O2 SAT: 73.5 %
PH VEN: 7.307 — AB (ref 7.250–7.300)
Patient temperature: 37
TCO2: 24.8 mmol/L (ref 0–100)
pCO2, Ven: 57.1 mmHg — ABNORMAL HIGH (ref 45.0–50.0)
pO2, Ven: 44.2 mmHg (ref 30.0–45.0)

## 2014-10-28 LAB — CBC
HCT: 43.3 % (ref 39.0–52.0)
Hemoglobin: 14.2 g/dL (ref 13.0–17.0)
MCH: 30.3 pg (ref 26.0–34.0)
MCHC: 32.8 g/dL (ref 30.0–36.0)
MCV: 92.5 fL (ref 78.0–100.0)
Platelets: 195 10*3/uL (ref 150–400)
RBC: 4.68 MIL/uL (ref 4.22–5.81)
RDW: 13.5 % (ref 11.5–15.5)
WBC: 8.2 10*3/uL (ref 4.0–10.5)

## 2014-10-28 LAB — MRSA PCR SCREENING: MRSA BY PCR: NEGATIVE

## 2014-10-28 MED ORDER — ACETAMINOPHEN 325 MG PO TABS: 650.0000 mg | ORAL_TABLET | Freq: Four times a day (QID) | ORAL | Status: DC | PRN

## 2014-10-28 MED ORDER — SODIUM CHLORIDE 0.9 % IJ SOLN: 3.0000 mL | INTRAMUSCULAR | Status: DC | PRN

## 2014-10-28 MED ORDER — SODIUM CHLORIDE 0.9 % IJ SOLN
3.0000 mL | Freq: Two times a day (BID) | INTRAMUSCULAR | Status: DC
  Administered 2014-10-28: 3 mL via INTRAVENOUS

## 2014-10-28 MED ORDER — OXYCODONE HCL 5 MG PO TABS: 5.0000 mg | ORAL_TABLET | ORAL | Status: DC | PRN

## 2014-10-28 MED ORDER — MONTELUKAST SODIUM 10 MG PO TABS
10.0000 mg | ORAL_TABLET | Freq: Every day | ORAL | Status: DC
  Administered 2014-10-28: 10 mg via ORAL
  Filled 2014-10-28: qty 1

## 2014-10-28 MED ORDER — HYDROCOD POLST-CPM POLST ER 10-8 MG/5ML PO SUER
5.0000 mL | Freq: Once | ORAL | Status: AC
  Administered 2014-10-28: 5 mL via ORAL
  Filled 2014-10-28: qty 5

## 2014-10-28 MED ORDER — CETYLPYRIDINIUM CHLORIDE 0.05 % MT LIQD
7.0000 mL | Freq: Two times a day (BID) | OROMUCOSAL | Status: DC
  Administered 2014-10-28 – 2014-10-29 (×3): 7 mL via OROMUCOSAL

## 2014-10-28 MED ORDER — ENOXAPARIN SODIUM 40 MG/0.4ML ~~LOC~~ SOLN
40.0000 mg | SUBCUTANEOUS | Status: DC
  Filled 2014-10-28 (×2): qty 0.4

## 2014-10-28 MED ORDER — ONDANSETRON HCL 4 MG PO TABS: 4.0000 mg | ORAL_TABLET | Freq: Four times a day (QID) | ORAL | Status: DC | PRN

## 2014-10-28 MED ORDER — METHYLPREDNISOLONE SODIUM SUCC 125 MG IJ SOLR: 125.0000 mg | Freq: Once | INTRAMUSCULAR | Status: DC

## 2014-10-28 MED ORDER — SODIUM CHLORIDE 0.9 % IV SOLN: INTRAVENOUS | Status: DC

## 2014-10-28 MED ORDER — GUAIFENESIN-DM 100-10 MG/5ML PO SYRP: 5.0000 mL | ORAL_SOLUTION | ORAL | Status: DC | PRN

## 2014-10-28 MED ORDER — IPRATROPIUM-ALBUTEROL 0.5-2.5 (3) MG/3ML IN SOLN
3.0000 mL | RESPIRATORY_TRACT | Status: DC
  Administered 2014-10-28: 3 mL via RESPIRATORY_TRACT
  Filled 2014-10-28: qty 3

## 2014-10-28 MED ORDER — ACETAMINOPHEN 650 MG RE SUPP: 650.0000 mg | Freq: Four times a day (QID) | RECTAL | Status: DC | PRN

## 2014-10-28 MED ORDER — METHYLPREDNISOLONE SODIUM SUCC 125 MG IJ SOLR
125.0000 mg | Freq: Once | INTRAMUSCULAR | Status: AC
  Administered 2014-10-28: 125 mg via INTRAVENOUS
  Filled 2014-10-28: qty 2

## 2014-10-28 MED ORDER — ALBUTEROL SULFATE (2.5 MG/3ML) 0.083% IN NEBU: 5.0000 mg | INHALATION_SOLUTION | RESPIRATORY_TRACT | Status: AC | PRN

## 2014-10-28 MED ORDER — ALBUTEROL SULFATE (2.5 MG/3ML) 0.083% IN NEBU
15.0000 mg | INHALATION_SOLUTION | Freq: Once | RESPIRATORY_TRACT | Status: AC
  Administered 2014-10-28: 15 mg via RESPIRATORY_TRACT
  Filled 2014-10-28: qty 18

## 2014-10-28 MED ORDER — MAGNESIUM SULFATE 2 GM/50ML IV SOLN
2.0000 g | Freq: Once | INTRAVENOUS | Status: AC
  Administered 2014-10-28: 2 g via INTRAVENOUS
  Filled 2014-10-28: qty 50

## 2014-10-28 MED ORDER — ONDANSETRON HCL 4 MG/2ML IJ SOLN: 4.0000 mg | Freq: Four times a day (QID) | INTRAMUSCULAR | Status: DC | PRN

## 2014-10-28 MED ORDER — ALUM & MAG HYDROXIDE-SIMETH 200-200-20 MG/5ML PO SUSP: 30.0000 mL | Freq: Four times a day (QID) | ORAL | Status: DC | PRN

## 2014-10-28 MED ORDER — IPRATROPIUM-ALBUTEROL 0.5-2.5 (3) MG/3ML IN SOLN
3.0000 mL | Freq: Three times a day (TID) | RESPIRATORY_TRACT | Status: DC
  Administered 2014-10-28 – 2014-10-29 (×3): 3 mL via RESPIRATORY_TRACT
  Filled 2014-10-28 (×3): qty 3

## 2014-10-28 MED ORDER — PREDNISONE 20 MG PO TABS
40.0000 mg | ORAL_TABLET | Freq: Every day | ORAL | Status: DC
Start: 2014-10-28 — End: 2014-10-29
  Administered 2014-10-28 – 2014-10-29 (×2): 40 mg via ORAL
  Filled 2014-10-28 (×2): qty 2

## 2014-10-28 MED ORDER — HYDROMORPHONE HCL 1 MG/ML IJ SOLN: 0.5000 mg | INTRAMUSCULAR | Status: DC | PRN

## 2014-10-28 MED ORDER — SODIUM CHLORIDE 0.9 % IV SOLN: 250.0000 mL | INTRAVENOUS | Status: DC | PRN

## 2014-10-28 MED ORDER — ENOXAPARIN SODIUM 40 MG/0.4ML ~~LOC~~ SOLN: 40.0000 mg | SUBCUTANEOUS | Status: DC

## 2014-10-28 NOTE — Progress Notes (Signed)
Patient admitted to the hospital earlier this morning by Dr. Lovell Sheehan.  Patient seen and examined. Lungs have diminished breath sounds bilaterally, with no wheezing. Respiratory effort is normal. No LE edema  Patient was admitted to the hospital with asthma exacerbation. Will start on bronchodilators and steroids. We'll continue current treatments for now. Anticipate discharge in the next 24 hours.  MEMON,JEHANZEB

## 2014-10-28 NOTE — Care Management Note (Signed)
Case Management Note  Patient Details  Name: Jerik Yildiz MRN: [id number] Date of Birth: [birth year]  Expected Discharge Date:  10/30/14               Expected Discharge Plan:  Home/Self Care  In-House Referral:  NA  Discharge planning Services  CM Consult  Post Acute Care Choice:  NA Choice offered to:  NA  DME Arranged:    DME Agency:     HH Arranged:    HH Agency:     Status of Service:  Completed, signed off  Medicare Important Message Given:    Date Medicare IM Given:    Medicare IM give by:    Date Additional Medicare IM Given:    Additional Medicare Important Message give by:     If discussed at Long Length of Stay Meetings, dates discussed:    Additional Comments: Pt is from home and independent with ADL's. Pt admitted for asthma exacerbation. Pt plans to discharge home with self care in next 24 hours. No CM needs anticipated.  Malcolm Metro, RN 10/28/2014, 2:53 PM

## 2014-10-28 NOTE — H&P (Signed)
[facility] Admission History and Physical       Daelen Belvedere WUJ:[id number] DOB: Mar 08, 1976 DOA: 10/27/2014  Referring physician: EDP PCP: [name], MD  Specialists:   Chief Complaint: Worsening  SOB  HPI: Drew Sandoval is a 39 y.o. male with a history of Asthma, and Allergies who presents to the ED with complaints of worsening SOB and wheezing over the past 24 hours unrelieved by his home medicationss.    He denies any fevers chills or cough.   He was evaluated in the ED and was hypoxic into the 80's.   He was administered continuous nebulizers treatments and IV Solumedrol and IV Magnesium and placed on a NRB O2 and began to have mild improvement.     Review of Systems:  Constitutional: No Weight Loss, No Weight Gain, Night Sweats, Fevers, Chills, Dizziness, Light Headedness, Fatigue, or Generalized Weakness HEENT: No Headaches, Difficulty Swallowing,Tooth/Dental Problems,Sore Throat,  No Sneezing, Rhinitis, Ear Ache, Nasal Congestion, or Post Nasal Drip,  Cardio-vascular:  No Chest pain, Orthopnea, PND, Edema in Lower Extremities, Anasarca, Dizziness, Palpitations  Resp: +Dyspnea, No DOE, No Productive Cough, No Non-Productive Cough, No Hemoptysis, +Wheezing.    GI: No Heartburn, Indigestion, Abdominal Pain, Nausea, Vomiting, Diarrhea, Constipation, Hematemesis, Hematochezia, Melena, Change in Bowel Habits,  Loss of Appetite  GU: No Dysuria, No Change in Color of Urine, No Urgency or Urinary Frequency, No Flank pain.  Musculoskeletal: No Joint Pain or Swelling, No Decreased Range of Motion, No Back Pain.  Neurologic: No Syncope, No Seizures, Muscle Weakness, Paresthesia, Vision Disturbance or Loss, No Diplopia, No Vertigo, No Difficulty Walking,  Skin: No Rash or Lesions. Psych: No Change in Mood or Affect, No Depression or Anxiety, No Memory loss, No Confusion, or Hallucinations   Past Medical History  Diagnosis Date  . Asthma   . Seasonal allergies       Past Surgical History  Procedure Laterality Date  . None        Prior to Admission medications   Medication Sig Start Date End Date Taking? Authorizing Provider  albuterol (PROVENTIL HFA;VENTOLIN HFA) 108 (90 BASE) MCG/ACT inhaler Inhale 2 puffs into the lungs every 4 (four) hours as needed for wheezing or shortness of breath. 04/29/14 04/29/15 Yes [name], MD  albuterol (PROVENTIL) (2.5 MG/3ML) 0.083% nebulizer solution Take 3 mLs (2.5 mg total) by nebulization every 6 (six) hours as needed for wheezing or shortness of breath. 05/13/14  Yes [name], MD  budesonide-formoterol [facility]) 160-4.5 MCG/ACT inhaler 2 puffs twice daily, then rinse mouth 03/22/14  Yes [name], MD  EPINEPHrine (EPIPEN) 0.3 mg/0.3 mL SOAJ injection Inject into thigh for severe allergic reaction 03/05/13  Yes [name], MD  feeding supplement, ENSURE ENLIVE, (ENSURE ENLIVE) LIQD Take 237 mLs by mouth 2 (two) times daily between meals. 09/25/14  Yes Christina P Rama, MD  montelukast (SINGULAIR) 10 MG tablet TAKE 1 TABLET BY MOUTH AT BEDTIME 07/17/14  Yes [name], MD  traMADol (ULTRAM) 50 MG tablet 1 every 8 hours if needed for cough 09/09/14  Yes [name], MD     Allergies  Allergen Reactions  . Azithromycin Rash  . Alvesco [Ciclesonide]     Possible reaction- rash  . Anoro Ellipta [Umeclidinium-Vilanterol]     Possible- rash  . Banana Swelling    Throat swelling   . Penicillins Rash    Social History:  reports that he quit smoking about 4 years ago. His smoking use included Cigarettes.  He has a 5.75 pack-year smoking history. He has quit using smokeless tobacco. He reports that he does not drink alcohol or use illicit drugs.    Family History  Problem Relation Age of Onset  . Emphysema Father   . Lung cancer Mother        Physical Exam:  GEN:  Pleasant Well Nourished Well Developed   39 y.o. African American male examined and in mild Distress; cooperative  with exam Filed Vitals:   10/28/14 0401 10/28/14 0419 10/28/14 0423 10/28/14 0430  BP:   132/67 121/67  Pulse:  83 81 55  Temp: 97.8 F (36.6 C)     TempSrc: Oral     Resp:  17 29 19   Height: 6\' 3"  (1.905 m)     Weight: 60.8 kg (134 lb 0.6 oz)     SpO2:  100% 100% 100%   Blood pressure 121/67, pulse 55, temperature 97.8 F (36.6 C), temperature source Oral, resp. rate 19, height 6\' 3"  (1.905 m), weight 60.8 kg (134 lb 0.6 oz), SpO2 100 %. PSYCH: He is alert and oriented x4; does not appear anxious does not appear depressed; affect is normal HEENT: Normocephalic and Atraumatic, Mucous membranes pink; PERRLA; EOM intact; Fundi:  Benign;  No scleral icterus, Nares: Patent, Oropharynx: Clear,  Fair Dentition,    Neck:  FROM, No Cervical Lymphadenopathy nor Thyromegaly or Carotid Bruit; No JVD; Breasts:: Not examined CHEST WALL: No tenderness CHEST: Tachypneic, with diffuse Expiratory Wheezes,  ormal respiration, clear to auscultation bilaterally HEART: Tachycardic, Regular rate and rhythm; no murmurs rubs or gallops BACK: No kyphosis or scoliosis; No CVA tenderness ABDOMEN: Positive Bowel Sounds, Soft Non-Tender, No Rebound or Guarding; No Masses, No Organomegaly Rectal Exam: Not done EXTREMITIES: No Cyanosis, Clubbing, or Edema; No Ulcerations. Genitalia: not examined PULSES: 2+ and symmetric SKIN: Normal hydration no rash or ulceration CNS:  Alert and Oriented x 4, No Focal Deficits Vascular: pulses palpable throughout    Labs on Admission:  Basic Metabolic Panel:  Recent Labs Lab 10/27/14 2350  NA 143  K 3.7  CL 108  CO2 28  GLUCOSE 80  BUN 14  CREATININE 0.89  CALCIUM 8.3*   Liver Function Tests: No results for input(s): AST, ALT, ALKPHOS, BILITOT, PROT, ALBUMIN in the last 168 hours. No results for input(s): LIPASE, AMYLASE in the last 168 hours. No results for input(s): AMMONIA in the last 168 hours. CBC:  Recent Labs Lab 10/27/14 2350  WBC 9.3  NEUTROABS  3.4  HGB 14.7  HCT 44.1  MCV 91.9  PLT 197   Cardiac Enzymes: No results for input(s): CKTOTAL, CKMB, CKMBINDEX, TROPONINI in the last 168 hours.  BNP (last 3 results)  Recent Labs  09/24/14 0943  BNP 23.5    ProBNP (last 3 results) No results for input(s): PROBNP in the last 8760 hours.  CBG: No results for input(s): GLUCAP in the last 168 hours.  Radiological Exams on Admission: Dg Chest Port 1 View  10/28/2014   CLINICAL DATA:  Acute onset of shortness of breath, wheezing and productive cough. Left lower lateral chest pain. Initial encounter.  EXAM: PORTABLE CHEST - 1 VIEW  COMPARISON:  Chest radiograph performed 09/24/2014  FINDINGS: The lungs are well-aerated and clear. There is no evidence of focal opacification, pleural effusion or pneumothorax.  The cardiomediastinal silhouette is within normal limits. No acute osseous abnormalities are seen.  IMPRESSION: No acute cardiopulmonary process seen.   Electronically Signed   By: Beryle Beams.D.  On: 10/28/2014 01:14      Assessment/Plan:   39 y.o. male with  Principal Problem:   1.    Asthma exacerbation   IV Steroids   DuoNebs   O2 PRN   Monitor O2 Sats   Active Problems:   2.   Acute respiratory failure with hypoxia   NRB O2   Monitor O2 sats     3.   DVT Prophylaxis    Lovenox       Code Status:     FULL CODE        Family Communication:   Family at Bedside   No Family Present    Disposition Plan:    Inpatient  Observation Status        Time spent:  49 70 Minutes      Ron Parker [facility] Pager [phone]   If 7AM -7PM Please Contact the Day Rounding Team MD for [facility]  If 7PM-7AM, Please Contact Night-Floor Coverage  [email or web address] Password [email or web address] 10/28/2014, 4:42 AM     ADDENDUM:   Patient was seen and examined on 10/28/2014

## 2014-10-29 ENCOUNTER — Ambulatory Visit (INDEPENDENT_AMBULATORY_CARE_PROVIDER_SITE_OTHER): Payer: BLUE CROSS/BLUE SHIELD

## 2014-10-29 ENCOUNTER — Telehealth: Payer: Self-pay | Admitting: Internal Medicine

## 2014-10-29 ENCOUNTER — Encounter: Payer: Self-pay | Admitting: *Deleted

## 2014-10-29 DIAGNOSIS — J309 Allergic rhinitis, unspecified: Secondary | ICD-10-CM

## 2014-10-29 MED ORDER — PREDNISONE 10 MG PO TABS: ORAL_TABLET | ORAL | Status: DC

## 2014-10-29 NOTE — Discharge Summary (Signed)
Physician Discharge Summary  Drew Sandoval HNG:[id number] DOB: 04-18-1976 DOA: 10/27/2014  PCP: [name], MD  Admit date: 10/27/2014 Discharge date: 10/29/2014  Time spent: 40 minutes  Recommendations for Outpatient Follow-up:  1. Follow with primary care physician in 2 weeks for hospital follow-up.  Discharge Diagnoses:  Principal Problem:   Asthma exacerbation Active Problems:   Acute respiratory failure with hypoxia   Discharge Condition: Improved  Diet recommendation: Regular diet  Filed Weights   10/27/14 2326 10/28/14 0401  Weight: 64.864 kg (143 lb) 60.8 kg (134 lb 0.6 oz)    History of present illness:  This is a 39 year old patient with a history of asthma who presents to the emergency room with worsening shortness of breath and wheezing for 24 hours prior to admission. This was unrelieved by his bronchodilators at home. He did not have any fevers or chills. He was hypoxic in the emergency room and was admitted for further treatments.  Hospital Course:  1. Acute respiratory failure with hypoxia. Related to exacerbation of asthma. Patient is now breathing comfortably on room air. Resolved. 2. Exacerbation of asthma. Patient was treated with intravenous steroids, bronchodilators. He initially required nonrebreather mask, but has now been weaned down to room air. His wheezing has resolved and he is clinically improved. He's been transitioned to prednisone taper. He already has his nebulizer, rescue inhaler, Symbicort, Singulair and other respiratory medications at home. He's been asked to follow-up with his primary care physician in 2 weeks for hospital follow-up.  Procedures:    Consultations:    Discharge Exam: Filed Vitals:   10/29/14 0601  BP: 119/61  Pulse: 66  Temp: 97.7 F (36.5 C)  Resp: 18    General: No acute distress, sitting up in bed comfortably Cardiovascular: S1, S2, regular rate and rhythm Respiratory: Clear to auscultation bilaterally,  no wheezes  Discharge Instructions   Discharge Instructions    Diet - low sodium heart healthy    Complete by:  As directed      Increase activity slowly    Complete by:  As directed           Current Discharge Medication List    START taking these medications   Details  predniSONE (DELTASONE) 10 MG tablet Take 40mg  po daily for 2 days then 30mg  po daily for 2 days then 20mg  po daily for 2 days then 10mg  po daily for 2 days then stop Qty: 20 tablet, Refills: 0      CONTINUE these medications which have NOT CHANGED   Details  albuterol (PROVENTIL HFA;VENTOLIN HFA) 108 (90 BASE) MCG/ACT inhaler Inhale 2 puffs into the lungs every 4 (four) hours as needed for wheezing or shortness of breath. Qty: 1 Inhaler, Refills: prn    albuterol (PROVENTIL) (2.5 MG/3ML) 0.083% nebulizer solution Take 3 mLs (2.5 mg total) by nebulization every 6 (six) hours as needed for wheezing or shortness of breath. Qty: 75 mL, Refills: 5    budesonide-formoterol (SYMBICORT) 160-4.5 MCG/ACT inhaler 2 puffs twice daily, then rinse mouth Qty: 1 Inhaler, Refills: 6    EPINEPHrine (EPIPEN) 0.3 mg/0.3 mL SOAJ injection Inject into thigh for severe allergic reaction Qty: 1 Device, Refills: prn    feeding supplement, ENSURE ENLIVE, (ENSURE ENLIVE) LIQD Take 237 mLs by mouth 2 (two) times daily between meals. Qty: 237 mL, Refills: 12    montelukast (SINGULAIR) 10 MG tablet TAKE 1 TABLET BY MOUTH AT BEDTIME Qty: 30 tablet, Refills: 11    traMADol (ULTRAM) 50 MG tablet  1 every 8 hours if needed for cough Qty: 40 tablet, Refills: 3       Allergies  Allergen Reactions  . Azithromycin Rash  . Alvesco [Ciclesonide]     Possible reaction- rash  . Anoro Ellipta [Umeclidinium-Vilanterol]     Possible- rash  . Banana Swelling    Throat swelling   . Penicillins Rash   Follow-up Information    Follow up with [name], MD.   Specialty:  Family Medicine   Contact information:   [address] [place] [address] [phone]        The results of significant diagnostics from this hospitalization (including imaging, microbiology, ancillary and laboratory) are listed below for reference.    Significant Diagnostic Studies: Dg Chest Port 1 View  10/28/2014   CLINICAL DATA:  Acute onset of shortness of breath, wheezing and productive cough. Left lower lateral chest pain. Initial encounter.  EXAM: PORTABLE CHEST - 1 VIEW  COMPARISON:  Chest radiograph performed 09/24/2014  FINDINGS: The lungs are well-aerated and clear. There is no evidence of focal opacification, pleural effusion or pneumothorax.  The cardiomediastinal silhouette is within normal limits. No acute osseous abnormalities are seen.  IMPRESSION: No acute cardiopulmonary process seen.   Electronically Signed   By: Roanna Raider M.D.   On: 10/28/2014 01:14    Microbiology: Recent Results (from the past 240 hour(s))  MRSA PCR Screening     Status: None   Collection Time: 10/28/14  3:47 AM  Result Value Ref Range Status   MRSA by PCR NEGATIVE NEGATIVE Final    Comment:        The GeneXpert MRSA Assay (FDA approved for NASAL specimens only), is one component of a comprehensive MRSA colonization surveillance program. It is not intended to diagnose MRSA infection nor to guide or monitor treatment for MRSA infections.      Labs: Basic Metabolic Panel:  Recent Labs Lab 10/27/14 2350 10/28/14 0529  NA 143 142  K 3.7 4.0  CL 108 108  CO2 28 27  GLUCOSE 80 145*  BUN 14 13  CREATININE 0.89 0.88  CALCIUM 8.3* 8.0*   Liver Function Tests: No results for input(s): AST, ALT, ALKPHOS, BILITOT, PROT, ALBUMIN in the last 168 hours. No results for input(s): LIPASE, AMYLASE in the last 168 hours. No results for input(s): AMMONIA in the last 168 hours. CBC:  Recent Labs Lab 10/27/14 2350 10/28/14 0529  WBC 9.3 8.2  NEUTROABS 3.4  --   HGB 14.7 14.2  HCT 44.1 43.3  MCV 91.9 92.5  PLT 197 195   Cardiac  Enzymes: No results for input(s): CKTOTAL, CKMB, CKMBINDEX, TROPONINI in the last 168 hours. BNP: BNP (last 3 results)  Recent Labs  09/24/14 0943  BNP 23.5    ProBNP (last 3 results) No results for input(s): PROBNP in the last 8760 hours.  CBG: No results for input(s): GLUCAP in the last 168 hours.     Signed:  MEMON,JEHANZEB  [facility] 10/29/2014, 10:44 AM

## 2014-10-29 NOTE — Telephone Encounter (Signed)
Letter printed and given to pt in lobby.

## 2014-10-29 NOTE — Progress Notes (Signed)
Patient ambulated this am prior to ambulating room air saturation was 97%, while ambulating it was 92%, no c/o pain or discomfort noted. Will continue to monitor patient .Marland Kitchen

## 2014-10-29 NOTE — Care Management Note (Signed)
Case Management Note  Patient Details  Name: Drew Sandoval MRN: [id number] Date of Birth: Feb 08, 1976  Subjective/Objective:                    Action/Plan:   Expected Discharge Date:  10/30/14               Expected Discharge Plan:  Home/Self Care  In-House Referral:  NA  Discharge planning Services  CM Consult  Post Acute Care Choice:  NA Choice offered to:  NA  DME Arranged:    DME Agency:     HH Arranged:    HH Agency:     Status of Service:  Completed, signed off  Medicare Important Message Given:    Date Medicare IM Given:    Medicare IM give by:    Date Additional Medicare IM Given:    Additional Medicare Important Message give by:     If discussed at Long Length of Stay Meetings, dates discussed:    Additional Comments: Pt discharged home today. No CM needs noted. Pt stated that he has a neb machine for home use. Arlyss Queen [place], RN 10/29/2014, 11:03 AM

## 2014-10-29 NOTE — Progress Notes (Signed)
Discharge instructions given on medications,and follow up visits,patient verbalized understanding. Prescription sent with patient. Accompanied by staff to an awaiting vehicle.

## 2014-11-01 ENCOUNTER — Ambulatory Visit (INDEPENDENT_AMBULATORY_CARE_PROVIDER_SITE_OTHER): Payer: BLUE CROSS/BLUE SHIELD

## 2014-11-01 DIAGNOSIS — J309 Allergic rhinitis, unspecified: Secondary | ICD-10-CM | POA: Diagnosis not present

## 2014-11-05 ENCOUNTER — Ambulatory Visit (INDEPENDENT_AMBULATORY_CARE_PROVIDER_SITE_OTHER): Payer: BLUE CROSS/BLUE SHIELD

## 2014-11-05 DIAGNOSIS — J309 Allergic rhinitis, unspecified: Secondary | ICD-10-CM

## 2014-11-06 ENCOUNTER — Ambulatory Visit (INDEPENDENT_AMBULATORY_CARE_PROVIDER_SITE_OTHER): Payer: BLUE CROSS/BLUE SHIELD

## 2014-11-06 DIAGNOSIS — J309 Allergic rhinitis, unspecified: Secondary | ICD-10-CM | POA: Diagnosis not present

## 2014-11-08 ENCOUNTER — Ambulatory Visit (INDEPENDENT_AMBULATORY_CARE_PROVIDER_SITE_OTHER): Payer: BLUE CROSS/BLUE SHIELD

## 2014-11-08 DIAGNOSIS — J309 Allergic rhinitis, unspecified: Secondary | ICD-10-CM

## 2014-11-15 ENCOUNTER — Ambulatory Visit (INDEPENDENT_AMBULATORY_CARE_PROVIDER_SITE_OTHER): Payer: BLUE CROSS/BLUE SHIELD

## 2014-11-15 DIAGNOSIS — J309 Allergic rhinitis, unspecified: Secondary | ICD-10-CM | POA: Diagnosis not present

## 2014-11-22 ENCOUNTER — Ambulatory Visit (INDEPENDENT_AMBULATORY_CARE_PROVIDER_SITE_OTHER): Payer: BLUE CROSS/BLUE SHIELD

## 2014-11-22 ENCOUNTER — Telehealth: Payer: Self-pay | Admitting: Internal Medicine

## 2014-11-22 DIAGNOSIS — J309 Allergic rhinitis, unspecified: Secondary | ICD-10-CM

## 2014-11-22 MED ORDER — PREDNISONE 10 MG PO TABS: 10.0000 mg | ORAL_TABLET | Freq: Every day | ORAL | Status: DC

## 2014-11-22 NOTE — Telephone Encounter (Signed)
lmomtcb x1 for pt 

## 2014-11-22 NOTE — Telephone Encounter (Signed)
I spoke with patient while he was here in the office for his allergy injection. He states he continues to have SOB and wheezing-would like to have maintenance  Rx for Prednisone to help. Spoke with CY about this and he wants to have patient take Prednisone 10 mg #30 take 1 po QD with 1 refill until his next OV with CY on 01-20-15 at 3:45pm.   [facility] [address] [place] is pharmacy of choice today.  Pt is aware of recs from CY and aware of Rx sent. Nothing more needed at this time.

## 2014-11-29 ENCOUNTER — Ambulatory Visit (INDEPENDENT_AMBULATORY_CARE_PROVIDER_SITE_OTHER): Payer: BLUE CROSS/BLUE SHIELD

## 2014-11-29 ENCOUNTER — Encounter: Payer: Self-pay | Admitting: Internal Medicine

## 2014-11-29 DIAGNOSIS — J309 Allergic rhinitis, unspecified: Secondary | ICD-10-CM | POA: Diagnosis not present

## 2014-12-06 ENCOUNTER — Ambulatory Visit (INDEPENDENT_AMBULATORY_CARE_PROVIDER_SITE_OTHER): Payer: BLUE CROSS/BLUE SHIELD

## 2014-12-06 ENCOUNTER — Telehealth: Payer: Self-pay | Admitting: Internal Medicine

## 2014-12-06 DIAGNOSIS — J309 Allergic rhinitis, unspecified: Secondary | ICD-10-CM | POA: Diagnosis not present

## 2014-12-06 MED ORDER — DOXYCYCLINE HYCLATE 100 MG PO TABS: 100.0000 mg | ORAL_TABLET | Freq: Two times a day (BID) | ORAL | Status: DC

## 2014-12-06 NOTE — Telephone Encounter (Signed)
Spoke with pt, c/o increased sob with any exertion, wheezing, prod cough with thick clear mucus.  States that this has been a problem for the last several months, has worsened in the past 3-4 weeks.  Denies fever, chest pain.   Pt has been taking maintenance dose prednisone  and mucinex  Spoke with CY, states he would like pt to be on doxycycline  #14, 1 po bid.   This has been sent to pharmacy.   Pt aware.  Nothing further needed.

## 2014-12-13 ENCOUNTER — Ambulatory Visit (INDEPENDENT_AMBULATORY_CARE_PROVIDER_SITE_OTHER): Payer: BLUE CROSS/BLUE SHIELD

## 2014-12-13 DIAGNOSIS — J309 Allergic rhinitis, unspecified: Secondary | ICD-10-CM | POA: Diagnosis not present

## 2014-12-16 ENCOUNTER — Telehealth: Payer: Self-pay | Admitting: Internal Medicine

## 2014-12-16 MED ORDER — PREDNISONE 10 MG PO TABS: 15.0000 mg | ORAL_TABLET | Freq: Every day | ORAL | Status: DC

## 2014-12-16 NOTE — Telephone Encounter (Signed)
Called spoke with pt. Aware of recs. RX sent in for pred. Nothing further needed

## 2014-12-16 NOTE — Telephone Encounter (Signed)
Called and spoke to pt. Pt c/o increase in SOB from baseline, dry cough, wheezing, and chest tightness. Pt states the s/s are intermittent and is unsure what makes them better or worse. Pt stated the neb meds do not help. Pt stated the pred taper will help when he has s/s like these but once he completes the taper a few days after his symptoms return. Pt has upcoming appt with CY on 9.12.16.   Dr. Maple Hudson please advise.   Allergies  Allergen Reactions  . Azithromycin Rash  . Alvesco [Ciclesonide]     Possible reaction- rash  . Anoro Ellipta [Umeclidinium-Vilanterol]     Possible- rash  . Banana Swelling    Throat swelling   . Penicillins Rash   Current Outpatient Prescriptions on File Prior to Visit  Medication Sig Dispense Refill  . albuterol (PROVENTIL HFA;VENTOLIN HFA) 108 (90 BASE) MCG/ACT inhaler Inhale 2 puffs into the lungs every 4 (four) hours as needed for wheezing or shortness of breath. 1 Inhaler prn  . albuterol (PROVENTIL) (2.5 MG/3ML) 0.083% nebulizer solution Take 3 mLs (2.5 mg total) by nebulization every 6 (six) hours as needed for wheezing or shortness of breath. 75 mL 5  . budesonide-formoterol (SYMBICORT) 160-4.5 MCG/ACT inhaler 2 puffs twice daily, then rinse mouth 1 Inhaler 6  . EPINEPHrine (EPIPEN) 0.3 mg/0.3 mL SOAJ injection Inject into thigh for severe allergic reaction 1 Device prn  . feeding supplement, ENSURE ENLIVE, (ENSURE ENLIVE) LIQD Take 237 mLs by mouth 2 (two) times daily between meals. 237 mL 12  . montelukast (SINGULAIR) 10 MG tablet TAKE 1 TABLET BY MOUTH AT BEDTIME 30 tablet 11  . traMADol (ULTRAM) 50 MG tablet 1 every 8 hours if needed for cough (Patient not taking: Reported on 10/28/2014) 40 tablet 3   No current facility-administered medications on file prior to visit.

## 2014-12-16 NOTE — Telephone Encounter (Signed)
He is steroid dependent, so suggest he stay on prednisone 15 mgt daily till next ov. Refill pred if needed to avoid running out.

## 2014-12-20 ENCOUNTER — Ambulatory Visit (INDEPENDENT_AMBULATORY_CARE_PROVIDER_SITE_OTHER): Payer: BLUE CROSS/BLUE SHIELD

## 2014-12-20 DIAGNOSIS — J309 Allergic rhinitis, unspecified: Secondary | ICD-10-CM

## 2014-12-27 ENCOUNTER — Ambulatory Visit: Payer: BLUE CROSS/BLUE SHIELD

## 2015-01-03 ENCOUNTER — Encounter: Payer: Self-pay | Admitting: Internal Medicine

## 2015-01-03 ENCOUNTER — Ambulatory Visit (INDEPENDENT_AMBULATORY_CARE_PROVIDER_SITE_OTHER): Payer: BLUE CROSS/BLUE SHIELD

## 2015-01-03 DIAGNOSIS — J309 Allergic rhinitis, unspecified: Secondary | ICD-10-CM

## 2015-01-05 ENCOUNTER — Emergency Department (HOSPITAL_COMMUNITY)
Admission: EM | Admit: 2015-01-05 | Discharge: 2015-01-05 | Disposition: A | Discharge status: Home / Self Care | Payer: BLUE CROSS/BLUE SHIELD | Attending: Emergency Medicine | Admitting: Emergency Medicine

## 2015-01-05 ENCOUNTER — Emergency Department (HOSPITAL_COMMUNITY): Payer: BLUE CROSS/BLUE SHIELD

## 2015-01-05 ENCOUNTER — Encounter (HOSPITAL_COMMUNITY): Payer: Self-pay | Admitting: *Deleted

## 2015-01-05 DIAGNOSIS — Z880 Allergy status to penicillin: Secondary | ICD-10-CM | POA: Insufficient documentation

## 2015-01-05 DIAGNOSIS — R053 Chronic cough: Secondary | ICD-10-CM

## 2015-01-05 DIAGNOSIS — Z79899 Other long term (current) drug therapy: Secondary | ICD-10-CM | POA: Diagnosis not present

## 2015-01-05 DIAGNOSIS — R05 Cough: Secondary | ICD-10-CM

## 2015-01-05 DIAGNOSIS — Z87891 Personal history of nicotine dependence: Secondary | ICD-10-CM | POA: Insufficient documentation

## 2015-01-05 DIAGNOSIS — Z7951 Long term (current) use of inhaled steroids: Secondary | ICD-10-CM | POA: Insufficient documentation

## 2015-01-05 DIAGNOSIS — J159 Unspecified bacterial pneumonia: Secondary | ICD-10-CM | POA: Insufficient documentation

## 2015-01-05 DIAGNOSIS — R0789 Other chest pain: Secondary | ICD-10-CM | POA: Insufficient documentation

## 2015-01-05 DIAGNOSIS — J45909 Unspecified asthma, uncomplicated: Secondary | ICD-10-CM | POA: Diagnosis not present

## 2015-01-05 DIAGNOSIS — Z7952 Long term (current) use of systemic steroids: Secondary | ICD-10-CM | POA: Diagnosis not present

## 2015-01-05 DIAGNOSIS — J189 Pneumonia, unspecified organism: Secondary | ICD-10-CM

## 2015-01-05 DIAGNOSIS — R0781 Pleurodynia: Secondary | ICD-10-CM

## 2015-01-05 LAB — CBC WITH DIFFERENTIAL/PLATELET
BASOS ABS: 0 10*3/uL (ref 0.0–0.1)
BASOS PCT: 0 % (ref 0–1)
EOS ABS: 0.1 10*3/uL (ref 0.0–0.7)
EOS PCT: 1 % (ref 0–5)
HCT: 45.7 % (ref 39.0–52.0)
Hemoglobin: 15.6 g/dL (ref 13.0–17.0)
LYMPHS ABS: 0.8 10*3/uL (ref 0.7–4.0)
Lymphocytes Relative: 8 % — ABNORMAL LOW (ref 12–46)
MCH: 30.6 pg (ref 26.0–34.0)
MCHC: 34.1 g/dL (ref 30.0–36.0)
MCV: 89.8 fL (ref 78.0–100.0)
Monocytes Absolute: 0.3 10*3/uL (ref 0.1–1.0)
Monocytes Relative: 3 % (ref 3–12)
Neutro Abs: 8.7 10*3/uL — ABNORMAL HIGH (ref 1.7–7.7)
Neutrophils Relative %: 89 % — ABNORMAL HIGH (ref 43–77)
PLATELETS: 169 10*3/uL (ref 150–400)
RBC: 5.09 MIL/uL (ref 4.22–5.81)
RDW: 13.7 % (ref 11.5–15.5)
WBC: 9.8 10*3/uL (ref 4.0–10.5)

## 2015-01-05 LAB — BASIC METABOLIC PANEL
Anion gap: 5 (ref 5–15)
BUN: 14 mg/dL (ref 6–20)
CALCIUM: 9.2 mg/dL (ref 8.9–10.3)
CO2: 24 mmol/L (ref 22–32)
CREATININE: 0.79 mg/dL (ref 0.61–1.24)
Chloride: 110 mmol/L (ref 101–111)
GFR calc Af Amer: >60 mL/min (ref >60)
Glucose, Bld: 107 mg/dL — ABNORMAL HIGH (ref 65–99)
Potassium: 4.4 mmol/L (ref 3.5–5.1)
SODIUM: 139 mmol/L (ref 135–145)

## 2015-01-05 LAB — TROPONIN I: Troponin I: <0.03 ng/mL (ref <0.031)

## 2015-01-05 LAB — D-DIMER, QUANTITATIVE (NOT AT ARMC): D DIMER QUANT: 0.51 ug{FEU}/mL — AB (ref 0.00–0.48)

## 2015-01-05 MED ORDER — ALBUTEROL SULFATE (2.5 MG/3ML) 0.083% IN NEBU
2.5000 mg | INHALATION_SOLUTION | Freq: Once | RESPIRATORY_TRACT | Status: AC
  Administered 2015-01-05: 2.5 mg via RESPIRATORY_TRACT
  Filled 2015-01-05: qty 3

## 2015-01-05 MED ORDER — DOXYCYCLINE HYCLATE 100 MG PO CAPS: 100.0000 mg | ORAL_CAPSULE | Freq: Two times a day (BID) | ORAL | Status: DC

## 2015-01-05 MED ORDER — KETOROLAC TROMETHAMINE 30 MG/ML IJ SOLN
30.0000 mg | Freq: Once | INTRAMUSCULAR | Status: AC
  Administered 2015-01-05: 30 mg via INTRAVENOUS
  Filled 2015-01-05: qty 1

## 2015-01-05 MED ORDER — NAPROXEN 500 MG PO TABS: 500.0000 mg | ORAL_TABLET | Freq: Two times a day (BID) | ORAL | Status: DC

## 2015-01-05 MED ORDER — DOXYCYCLINE HYCLATE 100 MG PO TABS
100.0000 mg | ORAL_TABLET | Freq: Once | ORAL | Status: AC
  Administered 2015-01-05: 100 mg via ORAL
  Filled 2015-01-05: qty 1

## 2015-01-05 MED ORDER — IOHEXOL 350 MG/ML SOLN
100.0000 mL | Freq: Once | INTRAVENOUS | Status: AC | PRN
  Administered 2015-01-05: 100 mL via INTRAVENOUS

## 2015-01-05 MED ORDER — BENZONATATE 100 MG PO CAPS
100.0000 mg | ORAL_CAPSULE | Freq: Three times a day (TID) | ORAL | Status: DC | PRN
Start: 2015-01-05 — End: 2015-01-20

## 2015-01-05 NOTE — ED Notes (Signed)
Pt c/o cough that started a year ago, becoming worse over the past few weeks, chest tightness for the past month, has been using his inhaler without relief,

## 2015-01-05 NOTE — ED Provider Notes (Signed)
CSN: [id number]     Arrival date & time 01/05/15  0505 History   First MD Initiated Contact with Patient 01/05/15 0535     Chief Complaint  Patient presents with  . Cough     (Consider location/radiation/quality/duration/timing/severity/associated sxs/prior Treatment) Patient is a 39 y.o. male presenting with cough. The history is provided by the patient.  Cough He has a history of asthma and states she has had problem with nonproductive cough for the last year. Cough is gotten significantly worse over the last month. There is intermittent associated dyspnea but no fever or chills. Over last week, he has had a left-sided chest pain no which he described as a tight feeling. Pain is worse with deep breath or cough. He denies nausea or vomiting and denies diaphoresis. He has taken a variety of over-the-counter cough medicines without relief. He does get intermittent dyspnea and he has an inhaler which also dyspnea but does not help the cough. He has taken his wife's Tessalon pearls which to give some temporary relief and allow him to sleep.   Past Medical History  Diagnosis Date  . Asthma   . Seasonal allergies    Past Surgical History  Procedure Laterality Date  . None     Family History  Problem Relation Age of Onset  . Emphysema Father   . Lung cancer Mother    Social History  Substance Use Topics  . Smoking status: Former Smoker -- 0.50 packs/day for 11.5 years    Types: Cigarettes    Quit date: 04/09/2010  . Smokeless tobacco: Former Neurosurgeon     Comment: started at age 45.  1/2 ppd.    . Alcohol Use: No    Review of Systems  Respiratory: Positive for cough.   All other systems reviewed and are negative.     Allergies  Azithromycin; Alvesco; Anoro ellipta; Banana; and Penicillins  Home Medications   Prior to Admission medications   Medication Sig Start Date End Date Taking? Authorizing Provider  albuterol (PROVENTIL HFA;VENTOLIN HFA) 108 (90 BASE) MCG/ACT inhaler  Inhale 2 puffs into the lungs every 4 (four) hours as needed for wheezing or shortness of breath. 04/29/14 04/29/15 Yes [name], MD  albuterol (PROVENTIL) (2.5 MG/3ML) 0.083% nebulizer solution Take 3 mLs (2.5 mg total) by nebulization every 6 (six) hours as needed for wheezing or shortness of breath. 05/13/14  Yes [name], MD  budesonide-formoterol [facility]) 160-4.5 MCG/ACT inhaler 2 puffs twice daily, then rinse mouth 03/22/14  Yes [name], MD  EPINEPHrine (EPIPEN) 0.3 mg/0.3 mL SOAJ injection Inject into thigh for severe allergic reaction 03/05/13  Yes [name], MD  montelukast (SINGULAIR) 10 MG tablet TAKE 1 TABLET BY MOUTH AT BEDTIME 07/17/14  Yes [name], MD  NON FORMULARY Allergy vaccines 1:50 weekly GH   Yes Historical Provider, MD  predniSONE (DELTASONE) 10 MG tablet Take 1.5 tablets (15 mg total) by mouth daily. 12/16/14  Yes [name], MD  traMADol (ULTRAM) 50 MG tablet 1 every 8 hours if needed for cough 09/09/14  Yes [name], MD  feeding supplement, ENSURE ENLIVE, (ENSURE ENLIVE) LIQD Take 237 mLs by mouth 2 (two) times daily between meals. 09/25/14   Maryruth Bun Rama, MD   BP 144/100 mmHg  Pulse 76  Temp(Src) 98.7 F (37.1 C) (Oral)  Resp 20  Ht  (1.93 m)  Wt 145 lb (65.772 kg)  BMI 17.66 kg/m2  SpO2 93% Physical Exam  Nursing note and vitals reviewed.  39 year old male, resting comfortably and in no acute distress. Vital signs are significant for hypertension. Oxygen saturation is 93%, which is normal. Head is normocephalic and atraumatic. PERRLA, EOMI. Oropharynx is clear. Neck is nontender and supple without adenopathy or JVD. Back is nontender and there is no CVA tenderness. Lungs are clear without rales, wheezes, or rhonchi. Slightly prolonged exhalation phase is noted. Chest is nontender. Heart has regular rate and rhythm without murmur. Abdomen is soft, flat, nontender without masses or hepatosplenomegaly and  peristalsis is normoactive. Extremities have no cyanosis or edema, full range of motion is present. Skin is warm and dry without rash. Neurologic: Mental status is normal, cranial nerves are intact, there are no motor or sensory deficits.  ED Course  Procedures (including critical care time) Labs Review Results for orders placed or performed during the hospital encounter of 01/05/15  Basic metabolic panel  Result Value Ref Range   Sodium 139 135 - 145 mmol/L   Potassium 4.4 3.5 - 5.1 mmol/L   Chloride 110 101 - 111 mmol/L   CO2 24 22 - 32 mmol/L   Glucose, Bld 107 (H) 65 - 99 mg/dL   BUN 14 6 - 20 mg/dL   Creatinine, Ser 1.61 0.61 - 1.24 mg/dL   Calcium 9.2 8.9 - 09.6 mg/dL   GFR calc non Af Amer >60 >60 mL/min   GFR calc Af Amer >60 >60 mL/min   Anion gap 5 5 - 15  CBC with Differential  Result Value Ref Range   WBC 9.8 4.0 - 10.5 K/uL   RBC 5.09 4.22 - 5.81 MIL/uL   Hemoglobin 15.6 13.0 - 17.0 g/dL   HCT 04.5 40.9 - 81.1 %   MCV 89.8 78.0 - 100.0 fL   MCH 30.6 26.0 - 34.0 pg   MCHC 34.1 30.0 - 36.0 g/dL   RDW 91.4 78.2 - 95.6 %   Platelets 169 150 - 400 K/uL   Neutrophils Relative % 89 (H) 43 - 77 %   Neutro Abs 8.7 (H) 1.7 - 7.7 K/uL   Lymphocytes Relative 8 (L) 12 - 46 %   Lymphs Abs 0.8 0.7 - 4.0 K/uL   Monocytes Relative 3 3 - 12 %   Monocytes Absolute 0.3 0.1 - 1.0 K/uL   Eosinophils Relative 1 0 - 5 %   Eosinophils Absolute 0.1 0.0 - 0.7 K/uL   Basophils Relative 0 0 - 1 %   Basophils Absolute 0.0 0.0 - 0.1 K/uL  D-dimer, quantitative  Result Value Ref Range   D-Dimer, Quant 0.51 (H) 0.00 - 0.48 ug/mL-FEU  Troponin I  Result Value Ref Range   Troponin I <0.03 <0.031 ng/mL   Imaging Review Dg Chest 2 View  01/05/2015   CLINICAL DATA:  Progressive cough over the last few weeks. Chest tightness.  EXAM: CHEST  2 VIEW  COMPARISON:  10/27/2014  FINDINGS: The lungs are hyperinflated with central bronchial thickening. There is right middle lobe opacity. The heart  size is normal. Pulmonary vasculature is normal. No pleural effusion or pneumothorax. No acute osseous abnormalities are seen.  IMPRESSION: 1. Hyperinflation with central bronchial thickening. 2. Right middle lobe opacity, may reflect pneumonia or collapse.   Electronically Signed   By: Rubye Oaks M.D.   On: 01/05/2015 06:24   I have personally reviewed and evaluated these images and lab results as part of my medical decision-making.    MDM   Final diagnoses:  Persistent  cough  Pleuritic chest pain  CAP (community acquired pneumonia)    Cough with pleuritic left-sided chest pain. Old records are reviewed and he has multiple outpatient visits for asthma as well as occasional hospitalization for asthma. Chest x-ray will be obtained in this screening labs obtained including d-dimer. Although I doubt cardiac etiology, will check ECG and troponin. He'll be given a trial of albuterol with ipratropium and ketorolac.  Pain is much better following ketorolac. D-dimer is come back slightly elevated. He will be sent for CT angiogram. Case is signed out to Dr. Bebe Shaggy.  [name], MD 01/06/15 [phone]

## 2015-01-05 NOTE — ED Notes (Addendum)
Patient ambulated around nurses station, O2 sats 93-100% at all times. Patient denies any shortness of breath.

## 2015-01-05 NOTE — ED Notes (Signed)
Patient d/c papers given and reviewed. Patient verbalized understanding.  

## 2015-01-05 NOTE — ED Provider Notes (Signed)
CT chest shows probable pneumonia Pt well appearing He ambulated without difficulty He appears well for discharge Discussed strict return precautions BP 117/76 mmHg  Pulse 62  Temp(Src) 98.3 F (36.8 C) (Oral)  Resp 20  Ht  (1.93 m)  Wt 145 lb (65.772 kg)  BMI 17.66 kg/m2  SpO2 97%   Zadie Rhine, MD 01/05/15 [phone]

## 2015-01-05 NOTE — Discharge Instructions (Signed)
Cough, Adult  A cough is a reflex that helps clear your throat and airways. It can help heal the body or may be a reaction to an irritated airway. A cough may only last 2 or 3 weeks (acute) or may last more than 8 weeks (chronic).  CAUSES Acute cough:  Viral or bacterial infections. Chronic cough:  Infections.  Allergies.  Asthma.  Post-nasal drip.  Smoking.  Heartburn or acid reflux.  Some medicines.  Chronic lung problems (COPD).  Cancer. SYMPTOMS   Cough.  Fever.  Chest pain.  Increased breathing rate.  High-pitched whistling sound when breathing (wheezing).  Colored mucus that you cough up (sputum). TREATMENT   A bacterial cough may be treated with antibiotic medicine.  A viral cough must run its course and will not respond to antibiotics.  Your caregiver may recommend other treatments if you have a chronic cough. HOME CARE INSTRUCTIONS   Only take over-the-counter or prescription medicines for pain, discomfort, or fever as directed by your caregiver. Use cough suppressants only as directed by your caregiver.  Use a cold steam vaporizer or humidifier in your bedroom or home to help loosen secretions.  Sleep in a semi-upright position if your cough is worse at night.  Rest as needed.  Stop smoking if you smoke. SEEK IMMEDIATE MEDICAL CARE IF:   You have pus in your sputum.  Your cough starts to worsen.  You cannot control your cough with suppressants and are losing sleep.  You begin coughing up blood.  You have difficulty breathing.  You develop pain which is getting worse or is uncontrolled with medicine.  You have a fever. MAKE SURE YOU:   Understand these instructions.  Will watch your condition.  Will get help right away if you are not doing well or get worse. Document Released: 10/23/2010 Document Revised: 07/19/2011 Document Reviewed: 10/23/2010 [facility] Patient Information 2015 [place], [place]. This information is not intended  to replace advice given to you by your health care provider. Make sure you discuss any questions you have with your health care provider.   Pleurisy Pleurisy is an inflammation and swelling of the lining of the lungs (pleura). Because of this inflammation, it hurts to breathe. It can be aggravated by coughing, laughing, or deep breathing. Pleurisy is often caused by an underlying infection or disease.  HOME CARE INSTRUCTIONS  Monitor your pleurisy for any changes. The following actions may help to alleviate any discomfort you are experiencing:  Medicine may help with pain. Only take over-the-counter or prescription medicines for pain, discomfort, or fever as directed by your health care provider.  Only take antibiotic medicine as directed. Make sure to finish it even if you start to feel better. SEEK MEDICAL CARE IF:   Your pain is not controlled with medicine or is increasing.  You have an increase in pus-like (purulent) secretions brought up with coughing. SEEK IMMEDIATE MEDICAL CARE IF:   You have blue or dark lips, fingernails, or toenails.  You are coughing up blood.  You have increased difficulty breathing.  You have continuing pain unrelieved by medicine or pain lasting more than 1 week.  You have pain that radiates into your neck, arms, or jaw.  You develop increased shortness of breath or wheezing.  You develop a fever, rash, vomiting, fainting, or other serious symptoms. MAKE SURE YOU:  Understand these instructions.   Will watch your condition.   Will get help right away if you are not doing well or get worse.  Document Released: 04/26/2005 Document Revised: 12/27/2012 Document Reviewed: 10/08/2012 [facility] Patient Information 2015 [place], [place]. This information is not intended to replace advice given to you by your health care provider. Make sure you discuss any questions you have with your health care provider.  Naproxen and naproxen sodium oral  immediate-release tablets What is this medicine? NAPROXEN (na PROX en) is a non-steroidal anti-inflammatory drug (NSAID). It is used to reduce swelling and to treat pain. This medicine may be used for dental pain, headache, or painful monthly periods. It is also used for painful joint and muscular problems such as arthritis, tendinitis, bursitis, and gout. This medicine may be used for other purposes; ask your health care provider or pharmacist if you have questions. COMMON BRAND NAME(S): Aflaxen, Aleve, Aleve Arthritis, All Day Relief, Anaprox, Anaprox DS, Naprosyn What should I tell my health care provider before I take this medicine? They need to know if you have any of these conditions: -asthma -cigarette smoker -drink more than 3 alcohol containing drinks a day -heart disease or circulation problems such as heart failure or leg edema (fluid retention) -high blood pressure -kidney disease -liver disease -stomach bleeding or ulcers -an unusual or allergic reaction to naproxen, aspirin, other NSAIDs, other medicines, foods, dyes, or preservatives -pregnant or trying to get pregnant -breast-feeding How should I use this medicine? Take this medicine by mouth with a glass of water. Follow the directions on the prescription label. Take it with food if your stomach gets upset. Try to not lie down for at least 10 minutes after you take it. Take your medicine at regular intervals. Do not take your medicine more often than directed. Long-term, continuous use may increase the risk of heart attack or stroke. A special MedGuide will be given to you by the pharmacist with each prescription and refill. Be sure to read this information carefully each time. Talk to your pediatrician regarding the use of this medicine in children. Special care may be needed. Overdosage: If you think you have taken too much of this medicine contact a poison control center or emergency room at once. NOTE: This medicine is  only for you. Do not share this medicine with others. What if I miss a dose? If you miss a dose, take it as soon as you can. If it is almost time for your next dose, take only that dose. Do not take double or extra doses. What may interact with this medicine? -alcohol -aspirin -cidofovir -diuretics -lithium -methotrexate -other drugs for inflammation like ketorolac or prednisone -pemetrexed -probenecid -warfarin This list may not describe all possible interactions. Give your health care provider a list of all the medicines, herbs, non-prescription drugs, or dietary supplements you use. Also tell them if you smoke, drink alcohol, or use illegal drugs. Some items may interact with your medicine. What should I watch for while using this medicine? Tell your doctor or health care professional if your pain does not get better. Talk to your doctor before taking another medicine for pain. Do not treat yourself. This medicine does not prevent heart attack or stroke. In fact, this medicine may increase the chance of a heart attack or stroke. The chance may increase with longer use of this medicine and in people who have heart disease. If you take aspirin to prevent heart attack or stroke, talk with your doctor or health care professional. Do not take other medicines that contain aspirin, ibuprofen, or naproxen with this medicine. Side effects such as stomach upset, nausea, or  ulcers may be more likely to occur. Many medicines available without a prescription should not be taken with this medicine. This medicine can cause ulcers and bleeding in the stomach and intestines at any time during treatment. Do not smoke cigarettes or drink alcohol. These increase irritation to your stomach and can make it more susceptible to damage from this medicine. Ulcers and bleeding can happen without warning symptoms and can cause death. You may get drowsy or dizzy. Do not drive, use machinery, or do anything that needs  mental alertness until you know how this medicine affects you. Do not stand or sit up quickly, especially if you are an older patient. This reduces the risk of dizzy or fainting spells. This medicine can cause you to bleed more easily. Try to avoid damage to your teeth and gums when you brush or floss your teeth. What side effects may I notice from receiving this medicine? Side effects that you should report to your doctor or health care professional as soon as possible: -black or bloody stools, blood in the urine or vomit -blurred vision -chest pain -difficulty breathing or wheezing -nausea or vomiting -severe stomach pain -skin rash, skin redness, blistering or peeling skin, hives, or itching -slurred speech or weakness on one side of the body -swelling of eyelids, throat, lips -unexplained weight gain or swelling -unusually weak or tired -yellowing of eyes or skin Side effects that usually do not require medical attention (report to your doctor or health care professional if they continue or are bothersome): -constipation -headache -heartburn This list may not describe all possible side effects. Call your doctor for medical advice about side effects. You may report side effects to FDA at 1-800-FDA-1088. Where should I keep my medicine? Keep out of the reach of children. Store at room temperature between 15 and 30 degrees C (59 and 86 degrees F). Keep container tightly closed. Throw away any unused medicine after the expiration date. NOTE: This sheet is a summary. It may not cover all possible information. If you have questions about this medicine, talk to your doctor, pharmacist, or health care provider.  2015, Elsevier/Gold Standard. (2009-04-28 20:10:16)  Benzonatate capsules What is this medicine? BENZONATATE (ben ZOE na tate) is used to treat cough. This medicine may be used for other purposes; ask your health care provider or pharmacist if you have questions. COMMON BRAND  NAME(S): Tessalon Perles, Zonatuss What should I tell my health care provider before I take this medicine? They need to know if you have any of these conditions: -kidney or liver disease -an unusual or allergic reaction to benzonatate, anesthetics, other medicines, foods, dyes, or preservatives -pregnant or trying to get pregnant -breast-feeding How should I use this medicine? Take this medicine by mouth with a glass of water. Follow the directions on the prescription label. Avoid breaking, chewing, or sucking the capsule, as this can cause serious side effects. Take your medicine at regular intervals. Do not take your medicine more often than directed. Talk to your pediatrician regarding the use of this medicine in children. While this drug may be prescribed for children as young as 5 years old for selected conditions, precautions do apply. Overdosage: If you think you have taken too much of this medicine contact a poison control center or emergency room at once. NOTE: This medicine is only for you. Do not share this medicine with others. What if I miss a dose? If you miss a dose, take it as soon as you can. If it  is almost time for your next dose, take only that dose. Do not take double or extra doses. What may interact with this medicine? Do not take this medicine with any of the following medications: -MAOIs like Carbex, Eldepryl, Marplan, Nardil, and Parnate This list may not describe all possible interactions. Give your health care provider a list of all the medicines, herbs, non-prescription drugs, or dietary supplements you use. Also tell them if you smoke, drink alcohol, or use illegal drugs. Some items may interact with your medicine. What should I watch for while using this medicine? Tell your doctor if your symptoms do not improve or if they get worse. If you have a high fever, skin rash, or headache, see your health care professional. You may get drowsy or dizzy. Do not drive, use  machinery, or do anything that needs mental alertness until you know how this medicine affects you. Do not sit or stand up quickly, especially if you are an older patient. This reduces the risk of dizzy or fainting spells. What side effects may I notice from receiving this medicine? Side effects that you should report to your doctor or health care professional as soon as possible: -allergic reactions like skin rash, itching or hives, swelling of the face, lips, or tongue -breathing problems -chest pain -confusion or hallucinations -irregular heartbeat -numbness of mouth or throat -seizures Side effects that usually do not require medical attention (report to your doctor or health care professional if they continue or are bothersome): -burning feeling in the eyes -constipation -headache -nasal congestion -stomach upset This list may not describe all possible side effects. Call your doctor for medical advice about side effects. You may report side effects to FDA at 1-800-FDA-1088. Where should I keep my medicine? Keep out of the reach of children. Store at room temperature between 15 and 30 degrees C (59 and 86 degrees F). Keep tightly closed. Protect from light and moisture. Throw away any unused medicine after the expiration date. NOTE: This sheet is a summary. It may not cover all possible information. If you have questions about this medicine, talk to your doctor, pharmacist, or health care provider.  2015, Elsevier/Gold Standard. (2007-07-26 14:52:56)

## 2015-01-07 ENCOUNTER — Encounter (HOSPITAL_COMMUNITY): Payer: Self-pay | Admitting: Emergency Medicine

## 2015-01-07 ENCOUNTER — Emergency Department (HOSPITAL_COMMUNITY)
Admission: EM | Admit: 2015-01-07 | Discharge: 2015-01-07 | Disposition: A | Discharge status: Home / Self Care | Payer: BLUE CROSS/BLUE SHIELD | Attending: Emergency Medicine | Admitting: Emergency Medicine

## 2015-01-07 DIAGNOSIS — Z79899 Other long term (current) drug therapy: Secondary | ICD-10-CM | POA: Diagnosis not present

## 2015-01-07 DIAGNOSIS — Z791 Long term (current) use of non-steroidal anti-inflammatories (NSAID): Secondary | ICD-10-CM | POA: Diagnosis not present

## 2015-01-07 DIAGNOSIS — Z880 Allergy status to penicillin: Secondary | ICD-10-CM | POA: Diagnosis not present

## 2015-01-07 DIAGNOSIS — J441 Chronic obstructive pulmonary disease with (acute) exacerbation: Secondary | ICD-10-CM | POA: Insufficient documentation

## 2015-01-07 DIAGNOSIS — R0602 Shortness of breath: Secondary | ICD-10-CM | POA: Diagnosis present

## 2015-01-07 DIAGNOSIS — Z87891 Personal history of nicotine dependence: Secondary | ICD-10-CM | POA: Diagnosis not present

## 2015-01-07 MED ORDER — ALBUTEROL SULFATE (2.5 MG/3ML) 0.083% IN NEBU
2.5000 mg | INHALATION_SOLUTION | Freq: Once | RESPIRATORY_TRACT | Status: AC
  Administered 2015-01-07: 2.5 mg via RESPIRATORY_TRACT
  Filled 2015-01-07: qty 3

## 2015-01-07 MED ORDER — PREDNISONE 50 MG PO TABS
60.0000 mg | ORAL_TABLET | Freq: Once | ORAL | Status: AC
  Administered 2015-01-07: 60 mg via ORAL
  Filled 2015-01-07 (×2): qty 1

## 2015-01-07 MED ORDER — IPRATROPIUM-ALBUTEROL 0.5-2.5 (3) MG/3ML IN SOLN
3.0000 mL | Freq: Once | RESPIRATORY_TRACT | Status: AC
  Administered 2015-01-07: 3 mL via RESPIRATORY_TRACT
  Filled 2015-01-07: qty 3

## 2015-01-07 MED ORDER — PREDNISONE 10 MG PO TABS: 20.0000 mg | ORAL_TABLET | Freq: Every day | ORAL | Status: DC

## 2015-01-07 NOTE — ED Provider Notes (Signed)
CSN: [id number]     Arrival date & time 01/07/15  1031 History  This chart was scribed for Bethann Berkshire, MD by Phillis Haggis, ED Scribe. This patient was seen in room APA05/APA05 and patient care was started at 11:36 AM.    Chief Complaint  Patient presents with  . Shortness of Breath   Patient is a 39 y.o. male presenting with shortness of breath. The history is provided by the patient. No language interpreter was used.  Shortness of Breath Severity:  Moderate Onset quality:  Sudden Timing:  Constant Progression:  Worsening Chronicity:  New Ineffective treatments:  Inhaler Associated symptoms: cough   Associated symptoms: no abdominal pain, no chest pain, no headaches and no rash   HPI Comments: [place] Nordquist is a 39 y.o. Male with hx of asthma and seasonal allergies who presents to the Emergency Department complaining of SOB onset earlier today. Pt states that he uses an inhaler every 4-6 hours as needed and Symbicort 2x a day. He states that he also used a nebulizer treatment this morning. He states that he finished prednisone last week. Pt was seen 2 days ago for a persistent non-productive cough. He denies being on oxygen at home.   Past Medical History  Diagnosis Date  . Asthma   . Seasonal allergies    Past Surgical History  Procedure Laterality Date  . None     Family History  Problem Relation Age of Onset  . Emphysema Father   . Lung cancer Mother    Social History  Substance Use Topics  . Smoking status: Former Smoker -- 0.50 packs/day for 11.5 years    Types: Cigarettes    Quit date: 04/09/2010  . Smokeless tobacco: Former Neurosurgeon     Comment: started at age 42.  1/2 ppd.    . Alcohol Use: No    Review of Systems  Constitutional: Negative for appetite change and fatigue.  HENT: Negative for congestion, ear discharge and sinus pressure.   Eyes: Negative for discharge.  Respiratory: Positive for cough and shortness of breath.   Cardiovascular: Negative for  chest pain.  Gastrointestinal: Negative for abdominal pain and diarrhea.  Genitourinary: Negative for frequency and hematuria.  Musculoskeletal: Negative for back pain.  Skin: Negative for rash.  Neurological: Negative for seizures and headaches.  Psychiatric/Behavioral: Negative for hallucinations.    Allergies  Azithromycin; Alvesco; Anoro ellipta; Banana; and Penicillins  Home Medications   Prior to Admission medications   Medication Sig Start Date End Date Taking? Authorizing Provider  albuterol (PROVENTIL HFA;VENTOLIN HFA) 108 (90 BASE) MCG/ACT inhaler Inhale 2 puffs into the lungs every 4 (four) hours as needed for wheezing or shortness of breath. 04/29/14 04/29/15 Yes [name], MD  albuterol (PROVENTIL) (2.5 MG/3ML) 0.083% nebulizer solution Take 3 mLs (2.5 mg total) by nebulization every 6 (six) hours as needed for wheezing or shortness of breath. 05/13/14  Yes [name], MD  benzonatate (TESSALON) 100 MG capsule Take 1 capsule (100 mg total) by mouth every 8 (eight) hours as needed for cough. 01/05/15  Yes [name], MD  budesonide-formoterol [facility]) 160-4.5 MCG/ACT inhaler 2 puffs twice daily, then rinse mouth Patient taking differently: Inhale 2 puffs into the lungs 2 (two) times daily.  03/22/14  Yes [name], MD  doxycycline (VIBRAMYCIN) 100 MG capsule Take 1 capsule (100 mg total) by mouth 2 (two) times daily. One po bid x 7 days 01/05/15  Yes Zadie Rhine, MD  montelukast (SINGULAIR) 10 MG  tablet TAKE 1 TABLET BY MOUTH AT BEDTIME Patient taking differently: Take 10 mg by mouth at bedtime.  07/17/14  Yes [name], MD  naproxen (NAPROSYN) 500 MG tablet Take 1 tablet (500 mg total) by mouth 2 (two) times daily. 01/05/15  Yes [name], MD  NON FORMULARY Allergy vaccines 1:50 weekly GH   Yes Historical Provider, MD  traMADol (ULTRAM) 50 MG tablet 1 every 8 hours if needed for cough Patient taking differently: Take 50 mg by mouth every 8 (eight)  hours as needed for moderate pain. 1 every 8 hours if needed for cough 09/09/14  Yes [name], MD  EPINEPHrine (EPIPEN) 0.3 mg/0.3 mL SOAJ injection Inject into thigh for severe allergic reaction 03/05/13   [name], MD  feeding supplement, ENSURE ENLIVE, (ENSURE ENLIVE) LIQD Take 237 mLs by mouth 2 (two) times daily between meals. 09/25/14   Maryruth Bun Rama, MD  predniSONE (DELTASONE) 10 MG tablet Take 1.5 tablets (15 mg total) by mouth daily. Patient not taking: Reported on 01/07/2015 12/16/14   [name], MD   BP 103/82 mmHg  Pulse 84  Temp(Src) 98.2 F (36.8 C) (Oral)  Resp 20  Ht 6\' 4"  (1.93 m)  Wt 145 lb (65.772 kg)  BMI 17.66 kg/m2  SpO2 93%  Physical Exam  Constitutional: He is oriented to person, place, and time. He appears well-developed.  HENT:  Head: Normocephalic.  Eyes: Conjunctivae and EOM are normal. No scleral icterus.  Neck: Neck supple. No thyromegaly present.  Cardiovascular: Normal rate and regular rhythm.  Exam reveals no gallop and no friction rub.   No murmur heard. Pulmonary/Chest: No stridor. He has wheezes. He has no rales. He exhibits no tenderness.  Moderate wheezing bilaterally  Abdominal: He exhibits no distension. There is no tenderness. There is no rebound.  Musculoskeletal: Normal range of motion. He exhibits no edema.  Lymphadenopathy:    He has no cervical adenopathy.  Neurological: He is oriented to person, place, and time. He exhibits normal muscle tone. Coordination normal.  Skin: No rash noted. No erythema.  Psychiatric: He has a normal mood and affect. His behavior is normal.    ED Course  Procedures (including critical care time) DIAGNOSTIC STUDIES: Oxygen Saturation is 93% on RA, low by my interpretation.    COORDINATION OF CARE: 11:38 AM-Discussed treatment plan which includes steroids with pt at bedside and pt agreed to plan.   Labs Review Labs Reviewed - No data to display  Imaging Review No results  found.    EKG Interpretation None      MDM   Final diagnoses:  None    Copd exacerbation, pt improved with tx.  rx prednisone, and follow up with pcp  The chart was scribed for me under my direct supervision.  I personally performed the history, physical, and medical decision making and all procedures in the evaluation of this patient.Bethann Berkshire, MD 01/10/15 [phone]

## 2015-01-07 NOTE — Discharge Instructions (Signed)
Follow up in 2-3 days for recheck °

## 2015-01-07 NOTE — ED Notes (Signed)
Ambulated patient around ED nurses station without difficulty. Patient sats 94% post walk. Patient informed Clinical research associate when [employer] checked they are normally within the low 90's. Dr. Estell Harpin made aware. No new orders given.

## 2015-01-07 NOTE — ED Notes (Signed)
MD at bedside. 

## 2015-01-07 NOTE — ED Notes (Signed)
Having SOB since this am.  Denies any pain.

## 2015-01-07 NOTE — ED Notes (Signed)
Povided patient drink and cackers.

## 2015-01-10 ENCOUNTER — Ambulatory Visit (INDEPENDENT_AMBULATORY_CARE_PROVIDER_SITE_OTHER): Payer: BLUE CROSS/BLUE SHIELD

## 2015-01-10 DIAGNOSIS — J309 Allergic rhinitis, unspecified: Secondary | ICD-10-CM

## 2015-01-17 ENCOUNTER — Ambulatory Visit (INDEPENDENT_AMBULATORY_CARE_PROVIDER_SITE_OTHER): Payer: BLUE CROSS/BLUE SHIELD

## 2015-01-17 DIAGNOSIS — J309 Allergic rhinitis, unspecified: Secondary | ICD-10-CM

## 2015-01-20 ENCOUNTER — Ambulatory Visit (INDEPENDENT_AMBULATORY_CARE_PROVIDER_SITE_OTHER)
Admission: RE | Admit: 2015-01-20 | Discharge: 2015-01-20 | Disposition: A | Discharge status: Home / Self Care | Payer: BLUE CROSS/BLUE SHIELD | Source: Ambulatory Visit | Attending: Internal Medicine | Admitting: Internal Medicine

## 2015-01-20 ENCOUNTER — Other Ambulatory Visit (INDEPENDENT_AMBULATORY_CARE_PROVIDER_SITE_OTHER): Payer: BLUE CROSS/BLUE SHIELD

## 2015-01-20 ENCOUNTER — Ambulatory Visit (INDEPENDENT_AMBULATORY_CARE_PROVIDER_SITE_OTHER): Payer: BLUE CROSS/BLUE SHIELD | Admitting: Internal Medicine

## 2015-01-20 ENCOUNTER — Encounter: Payer: Self-pay | Admitting: Internal Medicine

## 2015-01-20 VITALS — BP 114/60 | HR 85 | Ht 75.0 in | Wt 140.6 lb

## 2015-01-20 DIAGNOSIS — J309 Allergic rhinitis, unspecified: Secondary | ICD-10-CM

## 2015-01-20 DIAGNOSIS — J4550 Severe persistent asthma, uncomplicated: Secondary | ICD-10-CM

## 2015-01-20 DIAGNOSIS — D721 Eosinophilia, unspecified: Secondary | ICD-10-CM

## 2015-01-20 DIAGNOSIS — J302 Other seasonal allergic rhinitis: Secondary | ICD-10-CM

## 2015-01-20 DIAGNOSIS — J3089 Other allergic rhinitis: Secondary | ICD-10-CM

## 2015-01-20 LAB — CBC WITH DIFFERENTIAL/PLATELET
BASOS ABS: 0.1 10*3/uL (ref 0.0–0.1)
Basophils Relative: 0.5 % (ref 0.0–3.0)
EOS ABS: 0.9 10*3/uL — AB (ref 0.0–0.7)
Eosinophils Relative: 7.9 % — ABNORMAL HIGH (ref 0.0–5.0)
HEMATOCRIT: 46.8 % (ref 39.0–52.0)
Hemoglobin: 15.6 g/dL (ref 13.0–17.0)
LYMPHS ABS: 3.3 10*3/uL (ref 0.7–4.0)
LYMPHS PCT: 29 % (ref 12.0–46.0)
MCHC: 33.3 g/dL (ref 30.0–36.0)
MCV: 91 fl (ref 78.0–100.0)
MONOS PCT: 5.2 % (ref 3.0–12.0)
Monocytes Absolute: 0.6 10*3/uL (ref 0.1–1.0)
NEUTROS PCT: 57.4 % (ref 43.0–77.0)
Neutro Abs: 6.5 10*3/uL (ref 1.4–7.7)
Platelets: 208 10*3/uL (ref 150.0–400.0)
RBC: 5.14 Mil/uL (ref 4.22–5.81)
RDW: 15 % (ref 11.5–15.5)
WBC: 11.4 10*3/uL — ABNORMAL HIGH (ref 4.0–10.5)

## 2015-01-20 MED ORDER — PREDNISONE 10 MG PO TABS
ORAL_TABLET | ORAL | Status: DC
Start: 2015-01-20 — End: 2015-05-05

## 2015-01-20 NOTE — Progress Notes (Signed)
Patient ID: Drew Sandoval, male    DOB: Oct 02, 1975    MRN: [id number]  HPI 11/04/10- 37 yobm former smoker followed for severe asthma, chronic sinusitis, hype-reosinophilia and hyper IgE  Sent to Dr [name]  by Dr [name] for allergy evaluation. 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 Allergy profile- 08/11/10 IgE 1,747 with broad specific elevation Last here- August 31, 2010.- note reviewed. Since last here has been in hospital again, getting out 2 weeks ago. Feels well today.  Comes today as planned for allergy skin testing Skin test: Strongly positive especially grass, tree, dust mite  01/04/11- 39 yo former smoker followed for severe asthma, chronic sinusitis, hyper-eosinophilia and hyper IgE.  wife is with him  .Allergy vaccine was started 11/09/2010 and is building without problems He was still on prednisone when he saw Dr [name] recently and at that time he seemed improved after a recent exacerbation- 2 weeks ago. About 2 weeks after their visit he began coughing again. He hasn't noted any unusual exposure at work, or any sense that he had a cold.  His last IgE was 1747.  He doesn't feel "sick"- just tight and wheezy.  02/18/11-  89 yo former smoker followed for severe asthma, chronic sinusitis, hyper-eosinophilia and hyper IgE.  wife is with him Cooler weather is more comfortable for him. He continues to build allergy vaccine, now at 1:500, without problems. Some days continued to be better than others. 2 days ago thought his eyes were starting to itch and took Benadryl. We discussed comparison antihistamines. Not much sneezing. He has not needed his rescue inhaler in a long time but continues prednisone 20 mg daily. We educated on prednisone again.  04/20/11- 39 yo former smoker followed for severe asthma, chronic sinusitis, hyper-eosinophilia and hyper IgE.  wife is with him Went to ER 3 days ago- got neb and prednisone 60 mg x1. He finished  our last taper yesterday. Has had flu shot. Bending over, long walks or climbing stairs, and long work shifts all make him short of breath with wheezing. He worked last night and now feels "65%". He does not think he has an acute cold or infection. He was last really well about 5 days ago. He denies heartburn. He continues building allergy vaccine now at 1:50, without problems. IgE level has been out of range for Xolair.  07/19/11- 39 yo former smoker followed for severe asthma, chronic sinusitis, hyper-eosinophilia and hyper IgE. Separated from wife have now living with his mother. Asthma control is "a whole lot better". He has not missed work. Did have to go to emergency room for kidney stone. Questions allergy vaccine causing "dark spots" on his neck. Using prednisone only occasionally. No routine wheeze, does short of breath at times.  11/18/11- 39 yo former smoker followed for severe asthma, chronic sinusitis, hyper-eosinophilia and hyper IgE. Went to ER for approximately 4 asthma attacks in the past month; He has continued allergy vaccine at 1:50. He is needed his nebulizer 2 or 3 times daily and occasional rescue inhaler. He denies infection or change in workplace exposure. He does not recognize reflux. He usually flares as soon as he finishes the prednisone taper so he and his wife asked about maintenance prednisone which we discussed. Carefully. Sinus congestion tends to parallel his asthma episodes but he denies purulent nasal discharge or headache. We again discussed the significance of his elevated IgE and eosinophil levels. His IgE has been  too high for Xolair but we will reassess. CXR 11/11/11-  IMPRESSION: No active disease. Hyperinflation again noted.  Original Report Authenticated By: Natasha MeadLIVIU POP, M.D.   02/07/12- 39 yo former smoker followed for severe asthma, chronic sinusitis, hyper-eosinophilia and hyper IgE. FOLLOWS FOR: [facility] through  02/04/2012. DC summary reviewed. Discharge diagnosis asthma. Asthma began during the night. He took his own nebulizer, felt better and tried to go to work but had to leave. [facility] had [facility] where he was treated with 3 nebulizer treatments and sent out. [facility]. Discharged on a steroid taper. They comment in the summary that he was significantly depressed because he had watched his father died of asthma. He admits today that he had not told us of that history before. He feels better now but still wheezing and washed out as he tapers prednisone. He denies any sense of reflux or any recognized triggering experience. In the past he had thought there was something associated with his work place as discussed in earlier notes. He comments today that he can't breathe if he sleeps on his left side. There is no history of heart disease. He quit allergy vaccine in April, 2013.  11/19/13- 37 yo former smoker followed for severe asthma, chronic sinusitis, hyper-eosinophilia and hyper IgE- too high for Xolair.  FOLLOWS FOR:  Reports has good and bad days with breathing.  When having bad day, sob, wheezing and cough non-productive.  Hasn't done Allergy vaccine in 3-4 months due to no ride here to have done Hospital admission in March, emergency room visits in May and June because of asthma/cough. He denies any awareness of reflux, adenopathy, post nasal drainage. Cough is dry or scant white sputum. Has cough syrup at night but Tessalon does not help and daytime. He feels tight today. Last prednisone several weeks ago. Easy dyspnea on exertion without chest pain, palpitation, edema. 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 CXR 10/28/13- IMPRESSION:  Hyperinflation, otherwise negative. No change from prior study.  Electronically Signed  rec Continue routine meds Script for tramadol to try for  daytime cough You can also use an otc cough med like Delsym in between doses of tramadol if needed Script to try theophylline for breathing and cough. Take this med regularly, 1 twice daily after a meal, with food in your stomach. If it too strong, break it in half and take 1/2 tab twice daily We are stopping allergy shots for now, since you can't get down here easily.   12/10/2013 f/u ov/Wert re:  Severe asthma/ hyper E sp smoking cessation 2011/ 30 mg pred per day  Chief Complaint  Patient presents with  . Follow-up    Pt c/o wheezing with exertion, sometimes prod cough with clear mucus X3 months.  Recently saw CY for this, is being seen today to get a work release note.    breathing fine now and not needing rescue, typical of how he feels on prednisone, no better on theoph in terms of sob or need for saba No cough meds during the day, tramdol not helpful, does fine p tussionex, produces clear mucus each am.  No obvious day to day or daytime variabilty or assoc  cp or chest tightness, subjective wheeze overt sinus or hb symptoms. No unusual exp hx or h/o childhood pna/ asthma or knowledge of premature  birth. Sleeping ok p tussionex without nocturnal  or early am exacerbation  of respiratory  c/o's or need for noct saba. Also denies any obvious fluctuation of symptoms with weather or environmental changes or other aggravating or alleviating factors except as outlined above   01/24/14- 38 yoM former smoker followed for severe asthma, chronic sinusitis, hyper-eosinophilia and hyper IgE- too high for Xolair.  FOLLOWS FOR: Pt last seen by MW on 12/10/13. Pt has stopped with his vaccine for 1 year but wants to start up again d/t itchy eyes, runny nose, prod cough. Pt denies SOB and CP/tightness.   03/22/14- 47 yoM former smoker followed for severe asthma, chronic sinusitis, hyper-eosinophilia and hyper IgE- too high for Xolair FOLLOWS FOR: had attack yesterday. Pt states he lets his breathing get bad. Like  Symbicort better than Dulera and needs RX.We called in Pred taper yesterday.  Had flu vaccine. 3 weeks ago asthma began getting bad and really flared yesterday. Blames smell of burning leaves outdoors. Doesn't think he has a cold. Admits he let himself get out of hand before calling. Started prednisone last night and today feels much better.  07/17/14- 38 yoM former smoker followed for severe asthma, chronic sinusitis, hyper-eosinophilia and hyper IgE- too high for Xolair FOLLOWS FOR: Pt had attack last week-was given Prednisone; pt feels m-pred helps better. Also, patient needs refill for Singulair and Tessalon. Discuss allergy injections. Wants to try allergy shots again now that he has insurance to cover it. Blames recent exacerbation on weather change. Noticing increased sinus drainage without sneeze or headache.  09/19/14- 38 yoM former smoker followed for severe asthma, chronic sinusitis, hyper-eosinophilia and hyper IgE- too high for Xolair Follows For: Asthma is well controlled. C/o dry cough. After allergy shot in 09/17/14 right eye became swollen, red and tender.   09/24/14-  29 yoM former smoker followed for severe asthma, chronic sinusitis, hyper-eosinophilia and hyper IgE- too high for Xolair ACUTE VISIT: Flare up-cant walk 5 steps without getting SOB. Went to [facility] ER Sunday fpor asthma flare, with myalgias-currently on 60 mg of Prednisone.  Patient arrived at this office today for allergy shot. Noted weak, dyspneic, tachycardia. EKG 09/24/14 shows regular SVT at 160.  CXR 09/21/14- I reviewed image and agree IMPRESSION: RIGHT middle lobe partial collapse. Similar hyperinflation. Electronically Signed  By: Awilda Metro  On: 09/21/2014 06:51  10/09/14- 38 yoM former smoker followed for severe asthma, chronic sinusitis, hyper-eosinophilia and hyper IgE- too high for Xolair, hx SVT Reports: pt. still able to walk the 5 steps without getting SOB,wheezing a couple days ago,        Wife here Hosp f/u:5/17-5/18- exac asthma and SVT requiring cardioversion Now feels well, no wheeze on prednisone. Reviewed labs- Eos 14%,absolute 1.0( 0.0-0.7) Continues allergy vaccine building at 1:500 [facility] Office spirometry 10/09/14- Severe obstructive disease- FVC 2.63/ 52%, FEV1 0.97/ 24%, FEV1/FVC 0.37, FEF25-75% 0.36/ 8% Eosinophils 14% Allergy profile done 07/17/2014 showed very high total IgE 3283  01/20/15-  38 yoM former smoker followed for severe asthma, chronic sinusitis, hyper-eosinophilia and hyper IgE- too high for Xolair, hx SVT/ cardioversion Allergy vaccine 1:50GH    IgE too high for Xolair, Eos not high enough for Nucala FOLLOWS FOR: Pt continues to have SOB. Last ER 8/30 ER twice at end of August- prednisone 7 days, abx twice. Still feels tight. We had started maintenance prednisone and he was good, but let himself run out-> flare. CT chest 01/05/15 IMPRESSION: Left upper lobe peribronchial thickening suggesting bronchitis.  Consolidation of the right middle lobe with air bronchograms. Favor atelectasis but superimposed pneumonia is not excluded. No endobronchial mass is identified. Electronically Signed  By: Sherian Rein M.D.  On: 01/05/2015 08:13  ROS-see HPI   Negative unless "+" Constitutional:    weight loss, night sweats, fevers, chills,+ fatigue, lassitude. HEENT:    headaches, difficulty swallowing, tooth/dental problems, sore throat,       sneezing, itching, ear ache, +nasal congestion, post nasal drip, snoring CV:    chest pain, orthopnea, PND, swelling in lower extremities, anasarca,                                                        dizziness, palpitations Resp:   +shortness of breath with exertion or at rest.                productive cough,   +non-productive cough, coughing up of blood.              change in color of mucus.  +wheezing.   Skin:    rash or lesions. GI:  No-   heartburn, indigestion, abdominal pain, nausea, vomiting,  GU: d. MS:    joint pain, stiffness, decreased range of motion, back pain. Neuro-     nothing unusual Psych:  change in mood or affect.  depression or anxiety.   memory loss.  Objective:   Physical Exam General- Alert, Oriented, Affect-uncomfortable, near tears, Distress+ acute;  Tall, thin Skin- clear Lymphadenopathy- none Head- atraumatic            Eyes- Gross vision intact, PERRLA, conjunctivae clear secretions            Ears- Hearing, canals normal            Nose- Clear, no-Septal dev, mucus, polyps, erosion, perforation             Throat- Mallampati II , mucosa clear , drainage- none, tonsils- atrophic Neck- flexible , trachea midline, no stridor , thyroid nl, carotid no bruit Chest - symmetrical excursion , unlabored           Heart/CV- RRR rapid , no murmur , no gallop  , no rub, nl s1 s2                           - JVD- none , edema- none, stasis changes- none, varices- none           Lung-   diminished, unlabored,  Cough+ light , dullness-none, rub- none,  Wheeze+faint Abd- scaphoid  Br/ Gen/ Rectal- Not done, not indicated Extrem- cyanosis- none, clubbing, none, atrophy- none, strength- nl Neuro- +slight tremor

## 2015-01-20 NOTE — Patient Instructions (Signed)
Script sent for prednisone to stay on 10 mg daily  Flu vax  Order- lab   CBC w diff     Severe persistent asthma              CXR

## 2015-01-22 ENCOUNTER — Telehealth: Payer: Self-pay | Admitting: Internal Medicine

## 2015-01-22 NOTE — Telephone Encounter (Signed)
Per lab results: Result Note     Eosinophil counts are elevated enough to support consideration of Nucala therapy as an option for allergic asthma  Per CXR results: Notes Recorded by [name], MD on 01/21/2015 at 3:37 PM CXR- pneumonia changes seen before have now cleared. The lungs are over-inflated by asthma. The blood tests do indicate that we could use a type of asthma shots called Nucala. We will talk about that at next opportunity -----  lmomtcb x1

## 2015-01-22 NOTE — Assessment & Plan Note (Addendum)
Repeated ER trips for asthma flares requiring prednisone. Maintenance prednisone controlled him, but he ran out and flared again.steroid side effect discussion again. IgE too high for Xolair, but he continues allergy vaccine. If eosinophils high again, he should be offered trial of Nucala. Plan- CBC for eos count, CXR for clearance of atelectasis

## 2015-01-22 NOTE — Assessment & Plan Note (Signed)
Candidate for Nucala?

## 2015-01-22 NOTE — Assessment & Plan Note (Signed)
Continues allergy vaccine 1:50 GH 

## 2015-01-23 NOTE — Telephone Encounter (Signed)
Called pt back and LMTCB x1

## 2015-01-23 NOTE — Telephone Encounter (Signed)
ATC pt, someone answered then hung up. WCB

## 2015-01-24 ENCOUNTER — Ambulatory Visit (INDEPENDENT_AMBULATORY_CARE_PROVIDER_SITE_OTHER): Payer: BLUE CROSS/BLUE SHIELD

## 2015-01-24 DIAGNOSIS — J309 Allergic rhinitis, unspecified: Secondary | ICD-10-CM | POA: Diagnosis not present

## 2015-01-24 NOTE — Telephone Encounter (Signed)
lmtcb

## 2015-01-27 NOTE — Telephone Encounter (Signed)
This info was discussed with pt at his allergy injection on Friday by Florentina Addison and Dr. Maple Hudson.  Nothing further needed on this message.

## 2015-01-30 ENCOUNTER — Other Ambulatory Visit: Payer: Self-pay | Admitting: Internal Medicine

## 2015-01-31 ENCOUNTER — Telehealth: Payer: Self-pay | Admitting: Internal Medicine

## 2015-01-31 ENCOUNTER — Ambulatory Visit (INDEPENDENT_AMBULATORY_CARE_PROVIDER_SITE_OTHER): Payer: BLUE CROSS/BLUE SHIELD

## 2015-01-31 DIAGNOSIS — J309 Allergic rhinitis, unspecified: Secondary | ICD-10-CM | POA: Diagnosis not present

## 2015-01-31 MED ORDER — ALBUTEROL SULFATE (2.5 MG/3ML) 0.083% IN NEBU: 2.5000 mg | INHALATION_SOLUTION | Freq: Four times a day (QID) | RESPIRATORY_TRACT | Status: DC | PRN

## 2015-01-31 NOTE — Telephone Encounter (Signed)
Rx sent in and pt aware. Nothing further needed

## 2015-02-07 ENCOUNTER — Ambulatory Visit (INDEPENDENT_AMBULATORY_CARE_PROVIDER_SITE_OTHER): Payer: BLUE CROSS/BLUE SHIELD

## 2015-02-07 DIAGNOSIS — J309 Allergic rhinitis, unspecified: Secondary | ICD-10-CM

## 2015-02-10 ENCOUNTER — Telehealth: Payer: Self-pay | Admitting: Internal Medicine

## 2015-02-10 NOTE — Telephone Encounter (Signed)
Allergy Serum Extract Date Mixed: 02/10/2015 Vial: AB Strength: 1:10 Here/Mail/Pick Up: Here Mixed By: Jaynee Eagles, CMA

## 2015-02-11 ENCOUNTER — Ambulatory Visit (INDEPENDENT_AMBULATORY_CARE_PROVIDER_SITE_OTHER): Payer: BLUE CROSS/BLUE SHIELD

## 2015-02-11 DIAGNOSIS — J309 Allergic rhinitis, unspecified: Secondary | ICD-10-CM

## 2015-02-14 ENCOUNTER — Ambulatory Visit: Payer: BLUE CROSS/BLUE SHIELD

## 2015-02-21 ENCOUNTER — Ambulatory Visit (INDEPENDENT_AMBULATORY_CARE_PROVIDER_SITE_OTHER): Payer: BLUE CROSS/BLUE SHIELD

## 2015-02-21 DIAGNOSIS — J309 Allergic rhinitis, unspecified: Secondary | ICD-10-CM | POA: Diagnosis not present

## 2015-02-28 ENCOUNTER — Ambulatory Visit (INDEPENDENT_AMBULATORY_CARE_PROVIDER_SITE_OTHER): Payer: BLUE CROSS/BLUE SHIELD

## 2015-02-28 DIAGNOSIS — J309 Allergic rhinitis, unspecified: Secondary | ICD-10-CM | POA: Diagnosis not present

## 2015-03-04 ENCOUNTER — Emergency Department (HOSPITAL_COMMUNITY)
Admission: EM | Admit: 2015-03-04 | Discharge: 2015-03-04 | Disposition: A | Discharge status: Home / Self Care | Payer: BLUE CROSS/BLUE SHIELD | Attending: Emergency Medicine | Admitting: Emergency Medicine

## 2015-03-04 ENCOUNTER — Encounter (HOSPITAL_COMMUNITY): Payer: Self-pay | Admitting: Emergency Medicine

## 2015-03-04 ENCOUNTER — Emergency Department (HOSPITAL_COMMUNITY): Payer: BLUE CROSS/BLUE SHIELD

## 2015-03-04 DIAGNOSIS — Z79899 Other long term (current) drug therapy: Secondary | ICD-10-CM | POA: Insufficient documentation

## 2015-03-04 DIAGNOSIS — R0602 Shortness of breath: Secondary | ICD-10-CM | POA: Diagnosis present

## 2015-03-04 DIAGNOSIS — Z880 Allergy status to penicillin: Secondary | ICD-10-CM | POA: Diagnosis not present

## 2015-03-04 DIAGNOSIS — Z87891 Personal history of nicotine dependence: Secondary | ICD-10-CM | POA: Diagnosis not present

## 2015-03-04 DIAGNOSIS — Z791 Long term (current) use of non-steroidal anti-inflammatories (NSAID): Secondary | ICD-10-CM | POA: Diagnosis not present

## 2015-03-04 DIAGNOSIS — Z7951 Long term (current) use of inhaled steroids: Secondary | ICD-10-CM | POA: Insufficient documentation

## 2015-03-04 DIAGNOSIS — J45901 Unspecified asthma with (acute) exacerbation: Secondary | ICD-10-CM | POA: Diagnosis not present

## 2015-03-04 LAB — CBC WITH DIFFERENTIAL/PLATELET
BASOS PCT: 1 %
Basophils Absolute: 0.1 10*3/uL (ref 0.0–0.1)
Eosinophils Absolute: 0.9 10*3/uL — ABNORMAL HIGH (ref 0.0–0.7)
Eosinophils Relative: 13 %
HEMATOCRIT: 44.5 % (ref 39.0–52.0)
HEMOGLOBIN: 14.9 g/dL (ref 13.0–17.0)
LYMPHS ABS: 1.7 10*3/uL (ref 0.7–4.0)
Lymphocytes Relative: 24 %
MCH: 30.7 pg (ref 26.0–34.0)
MCHC: 33.5 g/dL (ref 30.0–36.0)
MCV: 91.8 fL (ref 78.0–100.0)
MONOS PCT: 4 %
Monocytes Absolute: 0.3 10*3/uL (ref 0.1–1.0)
NEUTROS ABS: 4.1 10*3/uL (ref 1.7–7.7)
NEUTROS PCT: 58 %
Platelets: 184 10*3/uL (ref 150–400)
RBC: 4.85 MIL/uL (ref 4.22–5.81)
RDW: 13.6 % (ref 11.5–15.5)
WBC: 7.2 10*3/uL (ref 4.0–10.5)

## 2015-03-04 LAB — BASIC METABOLIC PANEL
Anion gap: 6 (ref 5–15)
BUN: 13 mg/dL (ref 6–20)
CHLORIDE: 108 mmol/L (ref 101–111)
CO2: 28 mmol/L (ref 22–32)
CREATININE: 0.9 mg/dL (ref 0.61–1.24)
Calcium: 8.6 mg/dL — ABNORMAL LOW (ref 8.9–10.3)
GFR calc non Af Amer: >60 mL/min (ref >60)
Glucose, Bld: 80 mg/dL (ref 65–99)
Potassium: 3.6 mmol/L (ref 3.5–5.1)
Sodium: 142 mmol/L (ref 135–145)

## 2015-03-04 MED ORDER — ALBUTEROL SULFATE HFA 108 (90 BASE) MCG/ACT IN AERS: 2.0000 | INHALATION_SPRAY | RESPIRATORY_TRACT | Status: DC | PRN

## 2015-03-04 MED ORDER — PREDNISONE 20 MG PO TABS: 60.0000 mg | ORAL_TABLET | Freq: Every day | ORAL | Status: DC

## 2015-03-04 MED ORDER — METHYLPREDNISOLONE SODIUM SUCC 125 MG IJ SOLR
125.0000 mg | Freq: Once | INTRAMUSCULAR | Status: AC
  Administered 2015-03-04: 125 mg via INTRAVENOUS
  Filled 2015-03-04: qty 2

## 2015-03-04 MED ORDER — ALBUTEROL (5 MG/ML) CONTINUOUS INHALATION SOLN
15.0000 mg/h | INHALATION_SOLUTION | RESPIRATORY_TRACT | Status: DC
  Administered 2015-03-04: 15 mg/h via RESPIRATORY_TRACT
  Filled 2015-03-04: qty 20

## 2015-03-04 NOTE — ED Provider Notes (Signed)
Patient reexamined. He is sleeping. Saturations 90%. When I ask how he feels he still states "it feels like him breathing fast". His respiratory rate is approximately 16. His lungs are clear. No wheezing at rest or with cough. He has been back and forth to the bathroom without exacerbation. He does have a resting tremor status post 1 hour continuous albuterol. Heart rate 85. Think he is appropriate for outpatient treatment. Plan his refill on his Proventil. Prednisone. Primary care follow-up. ER return if any worsening symptoms.  Rolland PorterMark Gissella Niblack, MD 03/04/15 [phone]

## 2015-03-04 NOTE — ED Notes (Signed)
Pt c/o sob for months and cough.

## 2015-03-04 NOTE — ED Notes (Signed)
Pt asleep and continuous neb is still in progress.

## 2015-03-04 NOTE — Discharge Instructions (Signed)

## 2015-03-04 NOTE — ED Provider Notes (Signed)
TIME SEEN: 5:50 AM  CHIEF COMPLAINT: Asthma exacerbation  HPI: Pt is a 39 y.o. male with history of seasonal allergies, asthma who presents to the emergency department with shortness of breath for the past month increasing over the past 2 days with wheezing, dry cough. No fever or chest pain. States this feels similar to his prior asthma exacerbations. He states that many things trigger his asthma. He has been intubated once in the past for asthma exacerbation and has had prior ICU stays. No history of PE or DVT. No lower extremity swelling or pain. No history of CAD.  ROS: See HPI Constitutional: no fever  Eyes: no drainage  ENT: no runny nose   Cardiovascular:  no chest pain  Resp: SOB  GI: no vomiting GU: no dysuria Integumentary: no rash  Allergy: no hives  Musculoskeletal: no leg swelling  Neurological: no slurred speech ROS otherwise negative  PAST MEDICAL HISTORY/PAST SURGICAL HISTORY:  Past Medical History  Diagnosis Date  . Asthma   . Seasonal allergies     MEDICATIONS:  Prior to Admission medications   Medication Sig Start Date End Date Taking? Authorizing Provider  albuterol (PROVENTIL HFA;VENTOLIN HFA) 108 (90 BASE) MCG/ACT inhaler Inhale 2 puffs into the lungs every 4 (four) hours as needed for wheezing or shortness of breath. 04/29/14 04/29/15 Yes [name], MD  albuterol (PROVENTIL) (2.5 MG/3ML) 0.083% nebulizer solution Take 3 mLs (2.5 mg total) by nebulization every 6 (six) hours as needed for wheezing or shortness of breath. 01/31/15  Yes [name], MD  budesonide-formoterol (SYMBICORT) 160-4.5 MCG/ACT inhaler 2 puffs twice daily, then rinse mouth Patient taking differently: Inhale 2 puffs into the lungs 2 (two) times daily.  03/22/14  Yes [name], MD  EPINEPHrine (EPIPEN) 0.3 mg/0.3 mL SOAJ injection Inject into thigh for severe allergic reaction 03/05/13  Yes Clinton D Young, MD  montelukast (SINGULAIR) 10 MG tablet TAKE 1 TABLET BY MOUTH AT  BEDTIME Patient taking differently: Take 10 mg by mouth at bedtime.  07/17/14  Yes [name], MD  naproxen (NAPROSYN) 500 MG tablet Take 1 tablet (500 mg total) by mouth 2 (two) times daily. 01/05/15  Yes [name], MD  NON FORMULARY Allergy vaccines 1:50 weekly GH   Yes Historical Provider, MD  predniSONE (DELTASONE) 10 MG tablet 1 daily 01/20/15  Yes [name], MD  traMADol (ULTRAM) 50 MG tablet 1 every 8 hours if needed for cough Patient taking differently: Take 50 mg by mouth every 8 (eight) hours as needed for moderate pain. 1 every 8 hours if needed for cough 09/09/14  Yes [name], MD  feeding supplement, ENSURE ENLIVE, (ENSURE ENLIVE) LIQD Take 237 mLs by mouth 2 (two) times daily between meals. 09/25/14   Maryruth Bun Rama, MD    ALLERGIES:  Allergies  Allergen Reactions  . Azithromycin Rash  . Alvesco [Ciclesonide]     Possible reaction- rash  . Anoro Ellipta [Umeclidinium-Vilanterol]     Possible- rash  . Banana Swelling    Throat swelling   . Penicillins Rash    SOCIAL HISTORY:  Social History  Substance Use Topics  . Smoking status: Former Smoker -- 0.50 packs/day for 11.5 years    Types: Cigarettes    Quit date: 04/09/2010  . Smokeless tobacco: Former Neurosurgeon     Comment: started at age 7.  1/2 ppd.    . Alcohol Use: No    FAMILY HISTORY: Family History  Problem Relation Age  of Onset  . Emphysema Father   . Lung cancer Mother     EXAM: BP 142/91 mmHg  Pulse 60  Temp(Src) 98 F (36.7 C) (Oral)  Resp 20  Ht 6\' 3"  (1.905 m)  Wt 140 lb (63.504 kg)  BMI 17.50 kg/m2  SpO2 94% CONSTITUTIONAL: Alert and oriented and responds appropriately to questions. Well-appearing; well-nourished HEAD: Normocephalic EYES: Conjunctivae clear, PERRL ENT: normal nose; no rhinorrhea; moist mucous membranes; pharynx without lesions noted NECK: Supple, no meningismus, no LAD  CARD: RRR; S1 and S2 appreciated; no murmurs, no clicks, no rubs, no gallops RESP:  Speaking short sentences, patient is not tachypneic or hypoxic but does have prolonged expiratory phase and diffuse expiratory wheezing, significantly diminished at his bases bilaterally, no rhonchi or rales, no respiratory distress ABD/GI: Normal bowel sounds; non-distended; soft, non-tender, no rebound, no guarding, no peritoneal signs BACK:  The back appears normal and is non-tender to palpation, there is no CVA tenderness EXT: Normal ROM in all joints; non-tender to palpation; no edema; normal capillary refill; no cyanosis, no calf tenderness or swelling    SKIN: Normal color for age and race; warm NEURO: Moves all extremities equally, sensation to light touch intact diffusely, cranial nerves II through XII intact PSYCH: The patient's mood and manner are appropriate. Grooming and personal hygiene are appropriate.  MEDICAL DECISION MAKING: Patient here with asthma exacerbation. Does report shortness of breath for one month. We'll give continuous albuterol, Solu-Medrol. We'll obtain basic labs, chest x-ray and continue to closely monitor patient.   ED PROGRESS: 7:00 AM  Pt's breath sounds are improving and he reports feeling better. He still sounds diminished but his upper lobe x-ray wheezing is improving. He still has over half of his nebulizer treatment to complete. We'll reassess after nebulizer treatment finished. We'll ambulate with pulse oximetry after nebulizer treatment. Anticipate if patient is feeling better and he is not hypoxic, % improve that he will be discharged home with albuterol and steroid burst and close outpatient follow-up recommendations. Chest x-ray clear with no infiltrate.  Signed out to Dr. Fayrene FearingJames who will reassess after continuous neb.    EKG Interpretation  Date/Time:  Tuesday March 04 2015 06:09:27 EDT Ventricular Rate:  56 PR Interval:  188 QRS Duration: 71 QT Interval:  442 QTC Calculation: 427 R Axis:   85 Text Interpretation:  Normal sinus rhythm Early  repolarization No significant change since last tracing Confirmed by Jaequan Propes,  DO, Indigo Barbian [id number]) on 03/04/2015 6:13:32 AM        Layla MawKristen N Devonta Blanford, DO 03/04/15 [id number]

## 2015-03-07 ENCOUNTER — Ambulatory Visit (INDEPENDENT_AMBULATORY_CARE_PROVIDER_SITE_OTHER): Payer: BLUE CROSS/BLUE SHIELD

## 2015-03-07 DIAGNOSIS — J309 Allergic rhinitis, unspecified: Secondary | ICD-10-CM | POA: Diagnosis not present

## 2015-03-14 ENCOUNTER — Ambulatory Visit (INDEPENDENT_AMBULATORY_CARE_PROVIDER_SITE_OTHER): Payer: BLUE CROSS/BLUE SHIELD

## 2015-03-14 DIAGNOSIS — J309 Allergic rhinitis, unspecified: Secondary | ICD-10-CM

## 2015-03-21 ENCOUNTER — Ambulatory Visit (INDEPENDENT_AMBULATORY_CARE_PROVIDER_SITE_OTHER): Payer: BLUE CROSS/BLUE SHIELD

## 2015-03-21 DIAGNOSIS — J309 Allergic rhinitis, unspecified: Secondary | ICD-10-CM

## 2015-03-26 ENCOUNTER — Telehealth: Payer: Self-pay | Admitting: Internal Medicine

## 2015-03-26 MED ORDER — BUDESONIDE-FORMOTEROL FUMARATE 160-4.5 MCG/ACT IN AERO: INHALATION_SPRAY | RESPIRATORY_TRACT | Status: DC

## 2015-03-26 NOTE — Telephone Encounter (Signed)
LVM for pt to return call

## 2015-03-26 NOTE — Telephone Encounter (Signed)
LMTCB-pt will ask for Katie.  

## 2015-03-26 NOTE — Telephone Encounter (Signed)
Called and spoke to pt. Pt requesting Symbicort refill. Rx sent to preferred pharmacy. Pt verbalized understanding and denied any further questions or concerns at this time.

## 2015-03-27 NOTE — Telephone Encounter (Signed)
LVM for pt to return call

## 2015-03-27 NOTE — Telephone Encounter (Signed)
Pt returned call, says he is @ work right now and will call back after three when he gets off.Caren GriffinsStanley A Dalton

## 2015-03-27 NOTE — Telephone Encounter (Signed)
Spoke with patient-he is aware of coverage from Insurance on Nucala injections and that he will need to contact specialty pharmacy to set up patient account; the pharmacy will then call our office schedule shipment date. Pt states he will get the phone number when he comes by for his allergy injection on Friday 03-28-15.

## 2015-03-28 ENCOUNTER — Ambulatory Visit (INDEPENDENT_AMBULATORY_CARE_PROVIDER_SITE_OTHER): Payer: BLUE CROSS/BLUE SHIELD

## 2015-03-28 DIAGNOSIS — J309 Allergic rhinitis, unspecified: Secondary | ICD-10-CM | POA: Diagnosis not present

## 2015-03-28 MED ORDER — ALBUTEROL SULFATE HFA 108 (90 BASE) MCG/ACT IN AERS: 2.0000 | INHALATION_SPRAY | RESPIRATORY_TRACT | Status: DC | PRN

## 2015-03-28 NOTE — Telephone Encounter (Signed)
Called spoke with pt. He reports he needs a refill on his albuterol inhaler. I have refilled this for him. Per Drew Sandoval, Katie also wants message sent to her.

## 2015-03-28 NOTE — Telephone Encounter (Signed)
Called patient back-left message on machine for [facility]-pt to call back if any further questions or concerns.

## 2015-03-28 NOTE — Telephone Encounter (Signed)
[facility]  That was all he could tell me.  He can be reached at [phone].

## 2015-03-28 NOTE — Telephone Encounter (Signed)
lmtcb x3 on VM

## 2015-04-04 ENCOUNTER — Ambulatory Visit (INDEPENDENT_AMBULATORY_CARE_PROVIDER_SITE_OTHER): Payer: BLUE CROSS/BLUE SHIELD

## 2015-04-04 DIAGNOSIS — J309 Allergic rhinitis, unspecified: Secondary | ICD-10-CM

## 2015-04-11 ENCOUNTER — Ambulatory Visit (INDEPENDENT_AMBULATORY_CARE_PROVIDER_SITE_OTHER): Payer: BLUE CROSS/BLUE SHIELD

## 2015-04-11 DIAGNOSIS — J309 Allergic rhinitis, unspecified: Secondary | ICD-10-CM

## 2015-04-18 ENCOUNTER — Telehealth: Payer: Self-pay | Admitting: Internal Medicine

## 2015-04-18 ENCOUNTER — Ambulatory Visit (INDEPENDENT_AMBULATORY_CARE_PROVIDER_SITE_OTHER): Payer: BLUE CROSS/BLUE SHIELD

## 2015-04-18 DIAGNOSIS — J309 Allergic rhinitis, unspecified: Secondary | ICD-10-CM | POA: Diagnosis not present

## 2015-04-18 NOTE — Telephone Encounter (Signed)
[facility] Was informed that pt's Nucaula will be shipped to the office by 04/29/2015 by [employer] Address verified with pharmacy  [facility]

## 2015-04-25 ENCOUNTER — Ambulatory Visit (INDEPENDENT_AMBULATORY_CARE_PROVIDER_SITE_OTHER): Payer: BLUE CROSS/BLUE SHIELD

## 2015-04-25 ENCOUNTER — Telehealth: Payer: Self-pay | Admitting: Internal Medicine

## 2015-04-25 DIAGNOSIS — J309 Allergic rhinitis, unspecified: Secondary | ICD-10-CM

## 2015-04-25 MED ORDER — DOXYCYCLINE HYCLATE 100 MG PO TABS: ORAL_TABLET | ORAL | Status: DC

## 2015-04-25 NOTE — Telephone Encounter (Signed)
Describes sore throat increased head and chest congestion increased wheeze last 24-48 hours. Plan-increase maintenance prednisone 10 mg daily to take 30-40 mg daily through this weekend then taper down as tolerated. We will call antibiotic prescription to [facility]. He will be starting Nucala therapy soon.

## 2015-04-29 ENCOUNTER — Telehealth: Payer: Self-pay | Admitting: Internal Medicine

## 2015-04-29 MED ORDER — EPINEPHRINE 0.3 MG/0.3ML IJ SOAJ: INTRAMUSCULAR | Status: DC

## 2015-04-29 NOTE — Telephone Encounter (Signed)
Pt is aware that Nucala medication has arrived and is ready to start injections. Pt understands he must have an Epipen in hand at his first injection on Friday 05/02/15. Pt is aware of 2 hour wait time.    Appt has been made for 05/02/15 at 10:00am. Epipen sen to pharmacy and pt will come by the office tomorrow to pick up Epi-pen co-pay card.   All forms and Nucala information given to [employer] Scott in allergy.    Nucala 100mg /vial Lot# [id number] EXP: 06/2016 Rec'd: 04/29/15

## 2015-05-02 ENCOUNTER — Ambulatory Visit (INDEPENDENT_AMBULATORY_CARE_PROVIDER_SITE_OTHER): Payer: BLUE CROSS/BLUE SHIELD

## 2015-05-02 DIAGNOSIS — J45901 Unspecified asthma with (acute) exacerbation: Secondary | ICD-10-CM | POA: Diagnosis not present

## 2015-05-02 DIAGNOSIS — J309 Allergic rhinitis, unspecified: Secondary | ICD-10-CM | POA: Diagnosis not present

## 2015-05-02 MED ORDER — MEPOLIZUMAB 100 MG ~~LOC~~ SOLR
1.0000 mL | Freq: Once | SUBCUTANEOUS | Status: AC
  Administered 2015-05-02: 1 mL via SUBCUTANEOUS

## 2015-05-04 DIAGNOSIS — Z79899 Other long term (current) drug therapy: Secondary | ICD-10-CM | POA: Diagnosis not present

## 2015-05-04 DIAGNOSIS — Z880 Allergy status to penicillin: Secondary | ICD-10-CM | POA: Diagnosis not present

## 2015-05-04 DIAGNOSIS — J45909 Unspecified asthma, uncomplicated: Secondary | ICD-10-CM | POA: Insufficient documentation

## 2015-05-04 DIAGNOSIS — Z87891 Personal history of nicotine dependence: Secondary | ICD-10-CM | POA: Diagnosis not present

## 2015-05-04 DIAGNOSIS — R0602 Shortness of breath: Secondary | ICD-10-CM | POA: Diagnosis present

## 2015-05-05 ENCOUNTER — Emergency Department (HOSPITAL_COMMUNITY)
Admission: EM | Admit: 2015-05-05 | Discharge: 2015-05-05 | Disposition: A | Discharge status: Home / Self Care | Payer: BLUE CROSS/BLUE SHIELD | Attending: Emergency Medicine | Admitting: Emergency Medicine

## 2015-05-05 ENCOUNTER — Emergency Department (HOSPITAL_COMMUNITY): Payer: BLUE CROSS/BLUE SHIELD

## 2015-05-05 ENCOUNTER — Encounter (HOSPITAL_COMMUNITY): Payer: Self-pay | Admitting: *Deleted

## 2015-05-05 DIAGNOSIS — J45901 Unspecified asthma with (acute) exacerbation: Secondary | ICD-10-CM

## 2015-05-05 MED ORDER — ALBUTEROL SULFATE HFA 108 (90 BASE) MCG/ACT IN AERS: 2.0000 | INHALATION_SPRAY | RESPIRATORY_TRACT | Status: DC | PRN

## 2015-05-05 MED ORDER — ALBUTEROL SULFATE (2.5 MG/3ML) 0.083% IN NEBU
5.0000 mg | INHALATION_SOLUTION | Freq: Once | RESPIRATORY_TRACT | Status: AC
  Administered 2015-05-05: 5 mg via RESPIRATORY_TRACT
  Filled 2015-05-05: qty 6

## 2015-05-05 MED ORDER — PREDNISONE 20 MG PO TABS: ORAL_TABLET | ORAL | Status: DC

## 2015-05-05 MED ORDER — PREDNISONE 50 MG PO TABS
60.0000 mg | ORAL_TABLET | Freq: Once | ORAL | Status: AC
  Administered 2015-05-05: 60 mg via ORAL
  Filled 2015-05-05: qty 1

## 2015-05-05 MED ORDER — ALBUTEROL SULFATE (2.5 MG/3ML) 0.083% IN NEBU
2.5000 mg | INHALATION_SOLUTION | Freq: Once | RESPIRATORY_TRACT | Status: AC
  Administered 2015-05-05: 2.5 mg via RESPIRATORY_TRACT
  Filled 2015-05-05: qty 3

## 2015-05-05 MED ORDER — ALBUTEROL (5 MG/ML) CONTINUOUS INHALATION SOLN
INHALATION_SOLUTION | RESPIRATORY_TRACT | Status: AC
  Administered 2015-05-05: 15 mg
  Filled 2015-05-05: qty 20

## 2015-05-05 MED ORDER — IPRATROPIUM BROMIDE 0.02 % IN SOLN
RESPIRATORY_TRACT | Status: AC
  Filled 2015-05-05: qty 2.5

## 2015-05-05 NOTE — ED Notes (Signed)
Patient verbalizes understanding of discharge instructions, prescription medications, home care and follow up care. Patient ambulatory out of department at this time. 

## 2015-05-05 NOTE — ED Notes (Signed)
Pt c/o some sob that started yesterday evening; pt states he has been using inhaler and neb treatments without any relief

## 2015-05-05 NOTE — ED Notes (Signed)
Patient maintained 90% O2 while ambulating on room air.

## 2015-05-05 NOTE — ED Notes (Signed)
Patients oxygen saturation increased quickly to 96% after ambulating. Patient denied SOB while ambulating, no distress noted.

## 2015-05-05 NOTE — Discharge Instructions (Signed)
Continue using your nebulizer and inhaler for your wheezing and shortness of breath. Take the prednisone until gone. Recheck if you get a fever, you start coughing up yellow or green mucus, or you feel worse.   Asthma Attack Prevention While you may not be able to control the fact that you have asthma, you can take actions to prevent asthma attacks. The best way to prevent asthma attacks is to maintain good control of your asthma. You can achieve this by:  Taking your medicines as directed.  Avoiding things that can irritate your airways or make your asthma symptoms worse (asthma triggers).  Keeping track of how well your asthma is controlled and of any changes in your symptoms.  Responding quickly to worsening asthma symptoms (asthma attack).  Seeking emergency care when it is needed. WHAT ARE SOME WAYS TO PREVENT AN ASTHMA ATTACK? Have a Plan Work with your health care provider to create a written plan for managing and treating your asthma attacks (asthma action plan). This plan includes:  A list of your asthma triggers and how you can avoid them.  Information on when medicines should be taken and when their dosages should be changed.  The use of a device that measures how well your lungs are working (peak flow meter). Monitor Your Asthma Use your peak flow meter and record your results in a journal every day. A drop in your peak flow numbers on one or more days may indicate the start of an asthma attack. This can happen even before you start to feel symptoms. You can prevent an asthma attack from getting worse by following the steps in your asthma action plan. Avoid Asthma Triggers Work with your asthma health care provider to find out what your asthma triggers are. This can be done by:  Allergy testing.  Keeping a journal that notes when asthma attacks occur and the factors that may have contributed to them.  Determining if there are other medical conditions that are making your  asthma worse. Once you have determined your asthma triggers, take steps to avoid them. This may include avoiding excessive or prolonged exposure to:  Dust. Have someone dust and vacuum your home for you once or twice a week. Using a high-efficiency particulate arrestance (HEPA) vacuum is best.  Smoke. This includes campfire smoke, forest fire smoke, and secondhand smoke from tobacco products.  Pet dander. Avoid contact with animals that you know you are allergic to.  Allergens from trees, grasses or pollens. Avoid spending a lot of time outdoors when pollen counts are high, and on very windy days.  Very cold, dry, or humid air.  Mold.  Foods that contain high amounts of sulfites.  Strong odors.  Outdoor air pollutants, such as Museum/gallery exhibitions officerengine exhaust.  Indoor air pollutants, such as aerosol sprays and fumes from household cleaners.  Household pests, including dust mites and cockroaches, and pest droppings.  Certain medicines, including NSAIDs. Always talk to your health care provider before stopping or starting any new medicines. Medicines Take over-the-counter and prescription medicines only as told by your health care provider. Many asthma attacks can be prevented by carefully following your medicine schedule. Taking your medicines correctly is especially important when you cannot avoid certain asthma triggers. Act Quickly If an asthma attack does happen, acting quickly can decrease how severe it is and how long it lasts. Take these steps:   Pay attention to your symptoms. If you are coughing, wheezing, or having difficulty breathing, do not wait to see  if your symptoms go away on their own. Follow your asthma action plan.  If you have followed your asthma action plan and your symptoms are not improving, call your health care provider or seek immediate medical care at the nearest hospital. It is important to note how often you need to use your fast-acting rescue inhaler. If you are using  your rescue inhaler more often, it may mean that your asthma is not under control. Adjusting your asthma treatment plan may help you to prevent future asthma attacks and help you to gain better control of your condition. HOW CAN I PREVENT AN ASTHMA ATTACK WHEN I EXERCISE? Follow advice from your health care provider about whether you should use your fast-acting inhaler before exercising. Many people with asthma experience exercise-induced bronchoconstriction (EIB). This condition often worsens during vigorous exercise in cold, humid, or dry environments. Usually, people with EIB can stay very active by pre-treating with a fast-acting inhaler before exercising.   This information is not intended to replace advice given to you by your health care provider. Make sure you discuss any questions you have with your health care provider.   Document Released: 04/14/2009 Document Revised: 01/15/2015 Document Reviewed: 09/26/2014 Elsevier Interactive Patient Education [employer]

## 2015-05-05 NOTE — ED Provider Notes (Signed)
CSN: [id number]     Arrival date & time 05/04/15  2334 History  By signing my name below, I, Drew Sandoval, attest that this documentation has been prepared under the direction and in the presence of [name], MD at [phone] Electronically Signed: [name], ED Scribe. 05/05/2015. 12:59 AM.   Chief Complaint  Patient presents with  . Shortness of Breath   The history is provided by the patient. No language interpreter was used.   PCP: [name], MD HPI Comments: Drew Sandoval is a 39 y.o. male, with PMHx noted below including asthma, who presents to the Emergency Department complaining of SOB with associated dry cough, rhinorrhea with clear discharge, sneezing, wheezing and chest tightness onset yesterday evening, Dec 25th at 7 pm. Pt denies fever, sore throat. He has a history of reactive airway disease. He states he has been taking his Symbicort and his Singulair. He states he normally gets prednisone when he has a flareup. His last admission was in June.  PCP Dr Lowella Bandy Pulmonologist Dr Maple Hudson  Past Medical History  Diagnosis Date  . Asthma   . Seasonal allergies    Past Surgical History  Procedure Laterality Date  . None     Family History  Problem Relation Age of Onset  . Emphysema Father   . Lung cancer Mother    Social History  Substance Use Topics  . Smoking status: Former Smoker -- 0.50 packs/day for 11.5 years    Types: Cigarettes    Quit date: 04/09/2010  . Smokeless tobacco: Former Neurosurgeon     Comment: started at age 49.  1/2 ppd.    . Alcohol Use: No  employed  Review of Systems  HENT: Positive for rhinorrhea. Negative for sore throat.   Respiratory: Positive for chest tightness.   All other systems reviewed and are negative.  Allergies  Azithromycin; Alvesco; Anoro ellipta; Banana; and Penicillins  Home Medications   Prior to Admission medications   Medication Sig Start Date End Date Taking? Authorizing Provider  albuterol (PROVENTIL HFA;VENTOLIN  HFA) 108 (90 BASE) MCG/ACT inhaler Inhale 2 puffs into the lungs every 4 (four) hours as needed. 05/05/15   [name], MD  albuterol (PROVENTIL) (2.5 MG/3ML) 0.083% nebulizer solution Take 3 mLs (2.5 mg total) by nebulization every 6 (six) hours as needed for wheezing or shortness of breath. 01/31/15   [name], MD  budesonide-formoterol (SYMBICORT) 160-4.5 MCG/ACT inhaler 2 puffs twice daily, then rinse mouth 03/26/15   [name], MD  EPINEPHrine 0.3 mg/0.3 mL IJ SOAJ injection Inject into thigh for severe allergic reaction 04/29/15   [name], MD  montelukast (SINGULAIR) 10 MG tablet TAKE 1 TABLET BY MOUTH AT BEDTIME Patient taking differently: Take 10 mg by mouth at bedtime.  07/17/14   [name], MD  naproxen (NAPROSYN) 500 MG tablet Take 1 tablet (500 mg total) by mouth 2 (two) times daily. Patient not taking: Reported on 03/04/2015 01/05/15   [name], MD  NON FORMULARY Allergy vaccines 1:50 weekly GH    Historical Provider, MD  predniSONE (DELTASONE) 20 MG tablet Take 3 po QD x 3d , then 2 po QD x 3d then 1 po QD x 3d 05/05/15   [name], MD  traMADol (ULTRAM) 50 MG tablet 1 every 8 hours if needed for cough Patient taking differently: Take 50 mg by mouth every 8 (eight) hours as needed for moderate pain. 1 every 8 hours if needed for cough 09/09/14  Waymon Budgelinton D [name], MD   Triage Vitals: BP 116/101 mmHg  Pulse 81  Temp(Src) 98.4 F (36.9 C) (Oral)  Resp 20  Ht 6\' 4"  (1.93 m)  Wt 155 lb (70.308 kg)  BMI 18.88 kg/m2  SpO2 91%  Vital signs normal   Physical Exam  Constitutional: He is oriented to person, place, and time. He appears well-developed and well-nourished.  Non-toxic appearance. He does not appear ill. No distress.  HENT:  Head: Normocephalic and atraumatic.  Right Ear: External ear normal.  Left Ear: External ear normal.  Nose: Nose normal. No mucosal edema or rhinorrhea.  Mouth/Throat: Oropharynx is clear and moist and mucous membranes are  normal. No dental abscesses or uvula swelling.  Eyes: Conjunctivae and EOM are normal. Pupils are equal, round, and reactive to light.  Neck: Normal range of motion and full passive range of motion without pain. Neck supple.  Cardiovascular: Normal rate, regular rhythm and normal heart sounds.  Exam reveals no gallop and no friction rub.   No murmur heard. Pulmonary/Chest: Accessory muscle usage present. Tachypnea noted. No respiratory distress. He has decreased breath sounds. He has wheezes (diffused ). He has rhonchi (diffused). He has no rales. He exhibits no tenderness and no crepitus.  Abdominal: Soft. Normal appearance and bowel sounds are normal. He exhibits no distension. There is no tenderness. There is no rebound and no guarding.  Musculoskeletal: Normal range of motion. He exhibits no edema or tenderness.  Moves all extremities well.   Neurological: He is alert and oriented to person, place, and time. He has normal strength. No cranial nerve deficit.  Skin: Skin is warm, dry and intact. No rash noted. No erythema. No pallor.  Psychiatric: He has a normal mood and affect. His speech is normal and behavior is normal. His mood appears not anxious.  Nursing note and vitals reviewed.   ED Course  Procedures (including critical care time)  Medications  ipratropium (ATROVENT) 0.02 % nebulizer solution (  Not Given 05/05/15 0059)  albuterol (PROVENTIL) (2.5 MG/3ML) 0.083% nebulizer solution 2.5 mg (2.5 mg Nebulization Given 05/05/15 0045)  albuterol (PROVENTIL, VENTOLIN) (5 MG/ML) 0.5% continuous inhalation solution (15 mg  Given 05/05/15 0056)  predniSONE (DELTASONE) tablet 60 mg (60 mg Oral Given 05/05/15 0117)  albuterol (PROVENTIL) (2.5 MG/3ML) 0.083% nebulizer solution 5 mg (5 mg Nebulization Given 05/05/15 0257)    DIAGNOSTIC STUDIES: Oxygen Saturation is 91% on ra, low by my interpretation.    COORDINATION OF CARE: 12:55 AM: Discussed treatment plan which includes breathing  Tx, and imaging with pt at bedside; patient verbalizes understanding and agrees with treatment plan.  Patient had a nebulizer treatment started just prior to me seeing the patient. A continuous nebulizer with 15 mg was ordered and he was started on prednisone 60 mg orally.  Recheck at 2 AM patient is still having diffuse wheezing although now they are more lower pitched and almost like rhonchi. We discussed if he felt like he might need to be admitted but he can't decide. He was given a additional single albuterol nebulizer treatment.  Patient was rechecked at 4:15 AM. Patient states he's feeling much better. His lungs are now clear. Patient was discharged with a prescription for prednisone to take as a taper.  Pt was ambulated by nursing staff and his pulse ox was 90%. He states it did not make him feel more SOB or his wheezing worse. On exam he is still clear. He is going to be discharged to return if  he gets worse.   Imaging Review Dg Chest 2 View  05/05/2015  CLINICAL DATA:  Acute onset of shortness of breath and dry cough. Rhinorrhea. Sneezing and chest tightness. Initial encounter. EXAM: CHEST  2 VIEW COMPARISON:  None. FINDINGS: The lungs are well-aerated and clear. There is no evidence of focal opacification, pleural effusion or pneumothorax. The heart is normal in size; the mediastinal contour is within normal limits. No acute osseous abnormalities are seen. IMPRESSION: No acute cardiopulmonary process seen. Electronically Signed   By: Roanna Raider M.D.   On: 05/05/2015 02:26   I have personally reviewed and evaluated these images as part of my medical decision-making.  MDM   Final diagnoses:  Exacerbation of asthma    New Prescriptions   ALBUTEROL (PROVENTIL HFA;VENTOLIN HFA) 108 (90 BASE) MCG/ACT INHALER    Inhale 2 puffs into the lungs every 4 (four) hours as needed.   PREDNISONE (DELTASONE) 20 MG TABLET    Take 3 po QD x 3d , then 2 po QD x 3d then 1 po QD x 3d    Plan  discharge  [name], MD, FACEP   I personally performed the services described in this documentation, which was scribed in my presence. The recorded information has been reviewed and considered.  Plan discharge   [name], MD 05/05/15 1610

## 2015-05-07 ENCOUNTER — Telehealth: Payer: Self-pay | Admitting: Internal Medicine

## 2015-05-07 NOTE — Telephone Encounter (Addendum)
Nucala Order Date: 05/07/15  Ship Date:05/27/15   Called (05/30/15) [facility] to check the status of pt.'s order. They have tried to call pt. several times to resolve co-pay ($955) could not reach pt.  Spoke with [facility] rep.today(06/04/15) There is no co-pay assistance card on file.  I called Mr. Drew Sandoval to remind to get in touch with [facility] to get co-[place] assistance[facility] [place]  Pt. Is unable to cont. therapy at this time per pt.'s 06/25/15 ov. Nothing further needed.

## 2015-05-09 ENCOUNTER — Ambulatory Visit: Payer: BLUE CROSS/BLUE SHIELD

## 2015-05-09 ENCOUNTER — Ambulatory Visit (INDEPENDENT_AMBULATORY_CARE_PROVIDER_SITE_OTHER): Payer: BLUE CROSS/BLUE SHIELD

## 2015-05-09 DIAGNOSIS — J309 Allergic rhinitis, unspecified: Secondary | ICD-10-CM

## 2015-05-16 ENCOUNTER — Ambulatory Visit (INDEPENDENT_AMBULATORY_CARE_PROVIDER_SITE_OTHER): Payer: BLUE CROSS/BLUE SHIELD

## 2015-05-16 DIAGNOSIS — J309 Allergic rhinitis, unspecified: Secondary | ICD-10-CM

## 2015-05-22 ENCOUNTER — Encounter: Payer: Self-pay | Admitting: Internal Medicine

## 2015-05-22 ENCOUNTER — Ambulatory Visit (INDEPENDENT_AMBULATORY_CARE_PROVIDER_SITE_OTHER): Payer: BLUE CROSS/BLUE SHIELD | Admitting: Internal Medicine

## 2015-05-22 VITALS — BP 120/62 | HR 63 | Ht 76.0 in | Wt 156.6 lb

## 2015-05-22 DIAGNOSIS — D721 Eosinophilia, unspecified: Secondary | ICD-10-CM

## 2015-05-22 DIAGNOSIS — J4550 Severe persistent asthma, uncomplicated: Secondary | ICD-10-CM | POA: Diagnosis not present

## 2015-05-22 MED ORDER — TRAMADOL HCL 50 MG PO TABS: ORAL_TABLET | ORAL | Status: DC

## 2015-05-22 NOTE — Patient Instructions (Addendum)
We can continue current meds  Ok with me to get allergy shot today instead of tomorrow  Script printed for tramadol for cough  Please call as needed

## 2015-05-22 NOTE — Progress Notes (Signed)
Patient ID: Carlus PavlovMarshall Reidel, male    DOB: Oct 02, 1975    MRN: [id number]  HPI 11/04/10- 37 yobm former smoker followed for severe asthma, chronic sinusitis, hype-reosinophilia and hyper IgE  Sent to Dr [name]  by Dr [name] for allergy evaluation. 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 Allergy profile- 08/11/10 IgE 1,747 with broad specific elevation Last here- August 31, 2010.- note reviewed. Since last here has been in hospital again, getting out 2 weeks ago. Feels well today.  Comes today as planned for allergy skin testing Skin test: Strongly positive especially grass, tree, dust mite  01/04/11- 40 yo former smoker followed for severe asthma, chronic sinusitis, hyper-eosinophilia and hyper IgE.  wife is with him  .Allergy vaccine was started 11/09/2010 and is building without problems He was still on prednisone when he saw Dr [name] recently and at that time he seemed improved after a recent exacerbation- 2 weeks ago. About 2 weeks after their visit he began coughing again. He hasn't noted any unusual exposure at work, or any sense that he had a cold.  His last IgE was 1747.  He doesn't feel "sick"- just tight and wheezy.  02/18/11-  89 yo former smoker followed for severe asthma, chronic sinusitis, hyper-eosinophilia and hyper IgE.  wife is with him Cooler weather is more comfortable for him. He continues to build allergy vaccine, now at 1:500, without problems. Some days continued to be better than others. 2 days ago thought his eyes were starting to itch and took Benadryl. We discussed comparison antihistamines. Not much sneezing. He has not needed his rescue inhaler in a long time but continues prednisone 20 mg daily. We educated on prednisone again.  04/20/11- 40 yo former smoker followed for severe asthma, chronic sinusitis, hyper-eosinophilia and hyper IgE.  wife is with him Went to ER 3 days ago- got neb and prednisone 60 mg x1. He finished  our last taper yesterday. Has had flu shot. Bending over, long walks or climbing stairs, and long work shifts all make him short of breath with wheezing. He worked last night and now feels "65%". He does not think he has an acute cold or infection. He was last really well about 5 days ago. He denies heartburn. He continues building allergy vaccine now at 1:50, without problems. IgE level has been out of range for Xolair.  07/19/11- 40 yo former smoker followed for severe asthma, chronic sinusitis, hyper-eosinophilia and hyper IgE. Separated from wife have now living with his mother. Asthma control is "a whole lot better". He has not missed work. Did have to go to emergency room for kidney stone. Questions allergy vaccine causing "dark spots" on his neck. Using prednisone only occasionally. No routine wheeze, does short of breath at times.  11/18/11- 40 yo former smoker followed for severe asthma, chronic sinusitis, hyper-eosinophilia and hyper IgE. Went to ER for approximately 4 asthma attacks in the past month; He has continued allergy vaccine at 1:50. He is needed his nebulizer 2 or 3 times daily and occasional rescue inhaler. He denies infection or change in workplace exposure. He does not recognize reflux. He usually flares as soon as he finishes the prednisone taper so he and his wife asked about maintenance prednisone which we discussed. Carefully. Sinus congestion tends to parallel his asthma episodes but he denies purulent nasal discharge or headache. We again discussed the significance of his elevated IgE and eosinophil levels. His IgE has been  too high for Xolair but we will reassess. CXR 11/11/11-  IMPRESSION: No active disease. Hyperinflation again noted.  Original Report Authenticated By: Natasha MeadLIVIU POP, M.D.   02/07/12- 40 yo former smoker followed for severe asthma, chronic sinusitis, hyper-eosinophilia and hyper IgE. FOLLOWS FOR: [facility] through  02/04/2012. DC summary reviewed. Discharge diagnosis asthma. Asthma began during the night. He took his own nebulizer, felt better and tried to go to work but had to leave. [facility] had [facility] where he was treated with 3 nebulizer treatments and sent out. He [facility]. Discharged on a steroid taper. They comment in the summary that he was significantly depressed because he had watched his father died of asthma. He admits today that he had not told us of that history before. He feels better now but still wheezing and washed out as he tapers prednisone. He denies any sense of reflux or any recognized triggering experience. In the past he had thought there was something associated with his work place as discussed in earlier notes. He comments today that he can't breathe if he sleeps on his left side. There is no history of heart disease. He quit allergy vaccine in April, 2013.  11/19/13- 37 yo former smoker followed for severe asthma, chronic sinusitis, hyper-eosinophilia and hyper IgE- too high for Xolair.  FOLLOWS FOR:  Reports has good and bad days with breathing.  When having bad day, sob, wheezing and cough non-productive.  Hasn't done Allergy vaccine in 3-4 months due to no ride here to have done Hospital admission in March, emergency room visits in May and June because of asthma/cough. He denies any awareness of reflux, adenopathy, post nasal drainage. Cough is dry or scant white sputum. Has cough syrup at night but Tessalon does not help and daytime. He feels tight today. Last prednisone several weeks ago. Easy dyspnea on exertion without chest pain, palpitation, edema. 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 CXR 10/28/13- IMPRESSION:  Hyperinflation, otherwise negative. No change from prior study.  Electronically Signed  rec Continue routine meds Script for tramadol to try for  daytime cough You can also use an otc cough med like Delsym in between doses of tramadol if needed Script to try theophylline for breathing and cough. Take this med regularly, 1 twice daily after a meal, with food in your stomach. If it too strong, break it in half and take 1/2 tab twice daily We are stopping allergy shots for now, since you can't get down here easily.   12/10/2013 f/u ov/Wert re:  Severe asthma/ hyper E sp smoking cessation 2011/ 30 mg pred per day  Chief Complaint  Patient presents with  . Follow-up    Pt c/o wheezing with exertion, sometimes prod cough with clear mucus X3 months.  Recently saw CY for this, is being seen today to get a work release note.    breathing fine now and not needing rescue, typical of how he feels on prednisone, no better on theoph in terms of sob or need for saba No cough meds during the day, tramdol not helpful, does fine p tussionex, produces clear mucus each am.  No obvious day to day or daytime variabilty or assoc  cp or chest tightness, subjective wheeze overt sinus or hb symptoms. No unusual exp hx or h/o childhood pna/ asthma or knowledge of premature  birth. Sleeping ok p tussionex without nocturnal  or early am exacerbation  of respiratory  c/o's or need for noct saba. Also denies any obvious fluctuation of symptoms with weather or environmental changes or other aggravating or alleviating factors except as outlined above   01/24/14- 38 yoM former smoker followed for severe asthma, chronic sinusitis, hyper-eosinophilia and hyper IgE- too high for Xolair.  FOLLOWS FOR: Pt last seen by MW on 12/10/13. Pt has stopped with his vaccine for 1 year but wants to start up again d/t itchy eyes, runny nose, prod cough. Pt denies SOB and CP/tightness.   03/22/14- 78 yoM former smoker followed for severe asthma, chronic sinusitis, hyper-eosinophilia and hyper IgE- too high for Xolair FOLLOWS FOR: had attack yesterday. Pt states he lets his breathing get bad. Like  Symbicort better than Dulera and needs RX.We called in Pred taper yesterday.  Had flu vaccine. 3 weeks ago asthma began getting bad and really flared yesterday. Blames smell of burning leaves outdoors. Doesn't think he has a cold. Admits he let himself get out of hand before calling. Started prednisone last night and today feels much better.  07/17/14- 38 yoM former smoker followed for severe asthma, chronic sinusitis, hyper-eosinophilia and hyper IgE- too high for Xolair FOLLOWS FOR: Pt had attack last week-was given Prednisone; pt feels m-pred helps better. Also, patient needs refill for Singulair and Tessalon. Discuss allergy injections. Wants to try allergy shots again now that he has insurance to cover it. Blames recent exacerbation on weather change. Noticing increased sinus drainage without sneeze or headache.  09/19/14- 38 yoM former smoker followed for severe asthma, chronic sinusitis, hyper-eosinophilia and hyper IgE- too high for Xolair Follows For: Asthma is well controlled. C/o dry cough. After allergy shot in 09/17/14 right eye became swollen, red and tender.   09/24/14-  76 yoM former smoker followed for severe asthma, chronic sinusitis, hyper-eosinophilia and hyper IgE- too high for Xolair ACUTE VISIT: Flare up-cant walk 5 steps without getting SOB. Went to [facility] ER Sunday fpor asthma flare, with myalgias-currently on 60 mg of Prednisone.  Patient arrived at this office today for allergy shot. Noted weak, dyspneic, tachycardia. EKG 09/24/14 shows regular SVT at 160.  CXR 09/21/14- I reviewed image and agree IMPRESSION: RIGHT middle lobe partial collapse. Similar hyperinflation. Electronically Signed  By: Awilda Metro  On: 09/21/2014 06:51  10/09/14- 38 yoM former smoker followed for severe asthma, chronic sinusitis, hyper-eosinophilia and hyper IgE- too high for Xolair, hx SVT Reports: pt. still able to walk the 5 steps without getting SOB,wheezing a couple days ago,        Wife here Hosp f/u:5/17-5/18- exac asthma and SVT requiring cardioversion Now feels well, no wheeze on prednisone. Reviewed labs- Eos 14%,absolute 1.0( 0.0-0.7) Continues allergy vaccine building at 1:500 [facility] Office spirometry 10/09/14- Severe obstructive disease- FVC 2.63/ 52%, FEV1 0.97/ 24%, FEV1/FVC 0.37, FEF25-75% 0.36/ 8% Eosinophils 14% Allergy profile done 07/17/2014 showed very high total IgE 3283  01/20/15-  38 yoM former smoker followed for severe asthma, chronic sinusitis, hyper-eosinophilia and hyper IgE- too high for Xolair, hx SVT/ cardioversion Allergy vaccine 1:50GH    IgE too high for Xolair, Eos not high enough for Nucala FOLLOWS FOR: Pt continues to have SOB. Last ER 8/30 ER twice at end of August- prednisone 7 days, abx twice. Still feels tight. We had started maintenance prednisone and he was good, but let himself run out-> flare. CT chest 01/05/15 IMPRESSION: Left upper lobe peribronchial thickening suggesting bronchitis.  Consolidation of the right middle lobe with air bronchograms. Favor atelectasis but superimposed pneumonia is not excluded. No endobronchial mass is identified. Electronically Signed  By: Sherian ReinWei-Chen Lin M.D.  On: 01/05/2015 08:13  05/22/2015-40 year old male former smoker followed for severe asthma, chronic sinusitis, hypereosinophilia and hyper IgE-too high for Xolair, history SVT/cardioversion Allergy vaccine 1:50GH    IgE too high for Xolair, Nucala begun FOLLOWS FOR: pt. states breathing has improved. dry cough. denies wheezing, SOB or chest pain/tightness. Feels well at today's visit. Last prednisone ended over a week ago. CXR 05/05/15-no acute process  ROS-see HPI   Negative unless "+" Constitutional:    weight loss, night sweats, fevers, chills,+ fatigue, lassitude. HEENT:    headaches, difficulty swallowing, tooth/dental problems, sore throat,       sneezing, itching, ear ache, +nasal congestion, post nasal drip, snoring CV:    chest  pain, orthopnea, PND, swelling in lower extremities, anasarca,                                                        dizziness, palpitations Resp:   +shortness of breath with exertion or at rest.                productive cough,   +non-productive cough, coughing up of blood.              change in color of mucus.  +wheezing.   Skin:    rash or lesions. GI:  No-   heartburn, indigestion, abdominal pain, nausea, vomiting,  GU: d. MS:   joint pain, stiffness, decreased range of motion, back pain. Neuro-     nothing unusual Psych:  change in mood or affect.  depression or anxiety.   memory loss.  Objective:   Physical Exam General- Alert, Oriented, Affect-uncomfortable, near tears, Distress-none acute;  Tall, thin Skin- clear Lymphadenopathy- none Head- atraumatic            Eyes- Gross vision intact, PERRLA, conjunctivae clear secretions            Ears- Hearing, canals normal            Nose- Clear, no-Septal dev, mucus, polyps, erosion, perforation             Throat- Mallampati II , mucosa clear , drainage- none, tonsils- atrophic Neck- flexible , trachea midline, no stridor , thyroid nl, carotid no bruit Chest - symmetrical excursion , unlabored           Heart/CV- RRR rapid , no murmur , no gallop  , no rub, nl s1 s2                           - JVD- none , edema- none, stasis changes- none, varices- none           Lung-   diminished, unlabored,  Cough-none , dullness-none, rub- none,  Wheeze-none Abd- scaphoid  Br/ Gen/ Rectal- Not done, not indicated Extrem- cyanosis- none, clubbing, none, atrophy- none, strength- nl Neuro- +slight tremor

## 2015-05-23 ENCOUNTER — Ambulatory Visit: Payer: BLUE CROSS/BLUE SHIELD

## 2015-05-24 NOTE — Assessment & Plan Note (Signed)
At today's visit he is clear, little more than a week off prednisone. We are hoping Nucala will make a meaningful difference. Spring pollen season is usually bad for him.

## 2015-05-24 NOTE — Assessment & Plan Note (Signed)
Nucala begun late 2016

## 2015-05-30 ENCOUNTER — Ambulatory Visit (INDEPENDENT_AMBULATORY_CARE_PROVIDER_SITE_OTHER): Payer: BLUE CROSS/BLUE SHIELD

## 2015-05-30 DIAGNOSIS — J309 Allergic rhinitis, unspecified: Secondary | ICD-10-CM | POA: Diagnosis not present

## 2015-06-04 NOTE — Telephone Encounter (Signed)
I have attempted to contact patient; had to leave a message for patient to return my call. We need to make sure patient is giving the information regarding his co-pay assistance card through [employer] [place] to [employer] to help pay his co-pays. 2017 is a new year and deductible has to be paid out of pocket-his co=pay card should cover this.    Drew Sandoval, please [facility] and see if they have co-pay assistance information on file.  Thanks.

## 2015-06-06 ENCOUNTER — Ambulatory Visit (INDEPENDENT_AMBULATORY_CARE_PROVIDER_SITE_OTHER): Payer: BLUE CROSS/BLUE SHIELD

## 2015-06-06 DIAGNOSIS — J309 Allergic rhinitis, unspecified: Secondary | ICD-10-CM | POA: Diagnosis not present

## 2015-06-13 ENCOUNTER — Ambulatory Visit (INDEPENDENT_AMBULATORY_CARE_PROVIDER_SITE_OTHER): Payer: BLUE CROSS/BLUE SHIELD

## 2015-06-13 DIAGNOSIS — J309 Allergic rhinitis, unspecified: Secondary | ICD-10-CM

## 2015-06-15 ENCOUNTER — Emergency Department (HOSPITAL_COMMUNITY)
Admission: EM | Admit: 2015-06-15 | Discharge: 2015-06-15 | Disposition: A | Discharge status: Home / Self Care | Payer: BLUE CROSS/BLUE SHIELD | Attending: Emergency Medicine | Admitting: Emergency Medicine

## 2015-06-15 ENCOUNTER — Encounter (HOSPITAL_COMMUNITY): Payer: Self-pay | Admitting: *Deleted

## 2015-06-15 DIAGNOSIS — Z79899 Other long term (current) drug therapy: Secondary | ICD-10-CM | POA: Diagnosis not present

## 2015-06-15 DIAGNOSIS — Z87891 Personal history of nicotine dependence: Secondary | ICD-10-CM | POA: Diagnosis not present

## 2015-06-15 DIAGNOSIS — Z880 Allergy status to penicillin: Secondary | ICD-10-CM | POA: Insufficient documentation

## 2015-06-15 DIAGNOSIS — Z791 Long term (current) use of non-steroidal anti-inflammatories (NSAID): Secondary | ICD-10-CM | POA: Diagnosis not present

## 2015-06-15 DIAGNOSIS — Z7951 Long term (current) use of inhaled steroids: Secondary | ICD-10-CM | POA: Diagnosis not present

## 2015-06-15 DIAGNOSIS — J4541 Moderate persistent asthma with (acute) exacerbation: Secondary | ICD-10-CM | POA: Insufficient documentation

## 2015-06-15 DIAGNOSIS — R0602 Shortness of breath: Secondary | ICD-10-CM | POA: Diagnosis present

## 2015-06-15 MED ORDER — PREDNISONE 50 MG PO TABS
60.0000 mg | ORAL_TABLET | Freq: Every day | ORAL | Status: DC
  Administered 2015-06-15: 60 mg via ORAL
  Filled 2015-06-15: qty 1

## 2015-06-15 MED ORDER — PREDNISONE 10 MG PO TABS: ORAL_TABLET | ORAL | Status: DC

## 2015-06-15 MED ORDER — IPRATROPIUM-ALBUTEROL 0.5-2.5 (3) MG/3ML IN SOLN
3.0000 mL | Freq: Once | RESPIRATORY_TRACT | Status: AC
  Administered 2015-06-15: 3 mL via RESPIRATORY_TRACT
  Filled 2015-06-15: qty 3

## 2015-06-15 MED ORDER — ALBUTEROL (5 MG/ML) CONTINUOUS INHALATION SOLN
10.0000 mg/h | INHALATION_SOLUTION | Freq: Once | RESPIRATORY_TRACT | Status: AC
  Administered 2015-06-15: 10 mg/h via RESPIRATORY_TRACT
  Filled 2015-06-15: qty 20

## 2015-06-15 NOTE — ED Provider Notes (Signed)
CSN: [id number]     Arrival date & time 06/15/15  1438 History  By signing my name below, I, Elon Spanner, attest that this documentation has been prepared under the direction and in the presence of Trisha Mangle, PA-C. Electronically Signed: Elon Spanner ED Scribe. 06/15/2015. 2:57 PM.    Chief Complaint  Patient presents with  . Asthma   The history is provided by the patient. No language interpreter was used.   HPI Comments: Drew Sandoval is a 40 y.o. male with hx of asthma, bronchitis who presents to the Emergency Department complaining of moderate SOB onset 3 hours ago.  He has used a nebulizer and albuterol inhaler with some improvement.  This episode is described as his typical asthma exacerbation, however, the patient has had a cough and feels like "I may have a cold."  He reports prior flares have required prednisone tx (most recent steroid use 1 week ago).  He denies hx of PNA.  He denies fever.   Past Medical History  Diagnosis Date  . Asthma   . Seasonal allergies    Past Surgical History  Procedure Laterality Date  . None     Family History  Problem Relation Age of Onset  . Emphysema Father   . Lung cancer Mother    Social History  Substance Use Topics  . Smoking status: Former Smoker -- 0.50 packs/day for 11.5 years    Types: Cigarettes    Quit date: 04/09/2010  . Smokeless tobacco: Former Neurosurgeon     Comment: started at age 66.  1/2 ppd.    . Alcohol Use: No    Review of Systems A complete 10 system review of systems was obtained and all systems are negative except as noted in the HPI and PMH.   Allergies  Azithromycin; [place]ves[place]; Anoro ellipta; Banana; and Penicillins  Home Medications   Prior to Admission medications   Medication Sig Start Date End Date Taking? Authorizing Provider  albuterol (PROVENTIL HFA;VENTOLIN HFA) 108 (90 BASE) MCG/ACT inhaler Inhale 2 puffs into the lungs every 4 (four) hours as needed. 05/05/15   [name], MD  albuterol (PROVENTIL)  (2.5 MG/3ML) 0.083% nebulizer solution Take 3 mLs (2.5 mg total) by nebulization every 6 (six) hours as needed for wheezing or shortness of breath. 01/31/15   [name], MD  budesonide-formoterol (SYMBICORT) 160-4.5 MCG/ACT inhaler 2 puffs twice daily, then rinse mouth 03/26/15   [name], MD  EPINEPHrine 0.3 mg/0.3 mL IJ SOAJ injection Inject into thigh for severe allergic reaction 04/29/15   [name], MD  montelukast (SINGULAIR) 10 MG tablet TAKE 1 TABLET BY MOUTH AT BEDTIME Patient taking differently: Take 10 mg by mouth at bedtime.  07/17/14   [name], MD  naproxen (NAPROSYN) 500 MG tablet Take 1 tablet (500 mg total) by mouth 2 (two) times daily. 01/05/15   [name], MD  NON FORMULARY Allergy vaccines 1:50 weekly GH    Historical Provider, MD  traMADol (ULTRAM) 50 MG tablet 1 every 8 hours if needed for cough 05/22/15   [name], MD   BP 143/92 mmHg  Pulse 80  Temp(Src) 98.3 F (36.8 C) (Oral)  Resp 18  SpO2 95% Physical Exam  Constitutional: He is oriented to person, place, and time. He appears well-developed and well-nourished. No distress.  HENT:  Head: Normocephalic and atraumatic.  Eyes: Conjunctivae and EOM are normal.  Neck: Neck supple. No tracheal deviation present.  Cardiovascular: Normal rate.  Pulmonary/Chest: Effort normal. No respiratory distress.  Wheezing in all lobes  Musculoskeletal: Normal range of motion.  Neurological: He is alert and oriented to person, place, and time.  Skin: Skin is warm and dry.  Psychiatric: He has a normal mood and affect. His behavior is normal.  Nursing note and vitals reviewed.   ED Course  Procedures (including critical care time)  DIAGNOSTIC STUDIES: Oxygen Saturation is 95% on RA, normal by my interpretation.    COORDINATION OF CARE:  2:57 PM Discussed plan to order breathing tx.  Patient acknowledges and agrees with plan.    Labs Review Labs Reviewed - No data to display  Imaging  Review No results found. I have personally reviewed and evaluated these images and lab results as part of my medical decision-making.   EKG Interpretation None      MDM   Final diagnoses:  Asthma, moderate persistent, with acute exacerbation   Meds ordered this encounter  Medications  . predniSONE (DELTASONE) 10 MG tablet    Sig: 6,5,4,3,2,1 taper    Dispense:  21 tablet    Refill:  0    Order Specific Question:  Supervising Provider    Answer:  MILLER, BRIAN [[id number]]  . predniSONE (DELTASONE) tablet 60 mg    Sig:   . ipratropium-albuterol (DUONEB) 0.5-2.5 (3) MG/3ML nebulizer solution 3 mL    Sig:      [name], PA-C 06/15/15 1605  Doug Sou, MD 06/15/15 [phone]

## 2015-06-15 NOTE — ED Notes (Signed)
Pt ambulated around fast track, pulse ox 94% when we began and stayed 94-95% through out activity. Patient states his breathing "feels good".

## 2015-06-15 NOTE — ED Notes (Signed)
Pt comes in saying he feels short of breath starting around lunch. Pt has hx of asthma and state this feels the same. Pt states he has used inhaler and albuterol at home with no help. Pt is able to complete full sentences upon triage. Denies any pain. Denies n/v/d.

## 2015-06-15 NOTE — Discharge Instructions (Signed)
Asthma Attack Prevention °While you may not be able to control the fact that you have asthma, you can take actions to prevent asthma attacks. The best way to prevent asthma attacks is to maintain good control of your asthma. You can achieve this by: °· Taking your medicines as directed. °· Avoiding things that can irritate your airways or make your asthma symptoms worse (asthma triggers). °· Keeping track of how well your asthma is controlled and of any changes in your symptoms. °· Responding quickly to worsening asthma symptoms (asthma attack). °· Seeking emergency care when it is needed. °WHAT ARE SOME WAYS TO PREVENT AN ASTHMA ATTACK? °Have a Plan °Work with your health care provider to create a written plan for managing and treating your asthma attacks (asthma action plan). This plan includes: °· A list of your asthma triggers and how you can avoid them. °· Information on when medicines should be taken and when their dosages should be changed. °· The use of a device that measures how well your lungs are working (peak flow meter). °Monitor Your Asthma °Use your peak flow meter and record your results in a journal every day. A drop in your peak flow numbers on one or more days may indicate the start of an asthma attack. This can happen even before you start to feel symptoms. You can prevent an asthma attack from getting worse by following the steps in your asthma action plan. °Avoid Asthma Triggers °Work with your asthma health care provider to find out what your asthma triggers are. This can be done by: °· Allergy testing. °· Keeping a journal that notes when asthma attacks occur and the factors that may have contributed to them. °· Determining if there are other medical conditions that are making your asthma worse. °Once you have determined your asthma triggers, take steps to avoid them. This may include avoiding excessive or prolonged exposure to: °· Dust. Have someone dust and vacuum your home for you once or  twice a week. Using a high-efficiency particulate arrestance (HEPA) vacuum is best. °· Smoke. This includes campfire smoke, forest fire smoke, and secondhand smoke from tobacco products. °· Pet dander. Avoid contact with animals that you know you are allergic to. °· Allergens from trees, grasses or pollens. Avoid spending a lot of time outdoors when pollen counts are high, and on very windy days. °· Very cold, dry, or humid air. °· Mold. °· Foods that contain high amounts of sulfites. °· Strong odors. °· Outdoor air pollutants, such as engine exhaust. °· Indoor air pollutants, such as aerosol sprays and fumes from household cleaners. °· Household pests, including dust mites and cockroaches, and pest droppings. °· Certain medicines, including NSAIDs. Always talk to your health care provider before stopping or starting any new medicines. °Medicines °Take over-the-counter and prescription medicines only as told by your health care provider. Many asthma attacks can be prevented by carefully following your medicine schedule. Taking your medicines correctly is especially important when you cannot avoid certain asthma triggers. °Act Quickly °If an asthma attack does happen, acting quickly can decrease how severe it is and how long it lasts. Take these steps:  °· Pay attention to your symptoms. If you are coughing, wheezing, or having difficulty breathing, do not wait to see if your symptoms go away on their own. Follow your asthma action plan. °· If you have followed your asthma action plan and your symptoms are not improving, call your health care provider or seek immediate medical care   at the nearest hospital. °It is important to note how often you need to use your fast-acting rescue inhaler. If you are using your rescue inhaler more often, it may mean that your asthma is not under control. Adjusting your asthma treatment plan may help you to prevent future asthma attacks and help you to gain better control of your  condition. °HOW CAN I PREVENT AN ASTHMA ATTACK WHEN I EXERCISE? °Follow advice from your health care provider about whether you should use your fast-acting inhaler before exercising. Many people with asthma experience exercise-induced bronchoconstriction (EIB). This condition often worsens during vigorous exercise in cold, humid, or dry environments. Usually, people with EIB can stay very active by pre-treating with a fast-acting inhaler before exercising. °  °This information is not intended to replace advice given to you by your health care provider. Make sure you discuss any questions you have with your health care provider. °  °Document Released: 04/14/2009 Document Revised: 01/15/2015 Document Reviewed: 09/26/2014 °Elsevier Interactive Patient Education ©2016 [employer] ° °Asthma, Adult °Asthma is a recurring condition in which the airways tighten and narrow. Asthma can make it difficult to breathe. It can cause coughing, wheezing, and shortness of breath. Asthma episodes, also called asthma attacks, range from minor to life-threatening. Asthma cannot be cured, but medicines and lifestyle changes can help control it. °CAUSES °Asthma is believed to be caused by inherited (genetic) and environmental factors, but its exact cause is unknown. Asthma may be triggered by allergens, lung infections, or irritants in the air. Asthma triggers are different for each person. Common triggers include:  °· Animal dander. °· Dust mites. °· Cockroaches. °· Pollen from trees or grass. °· Mold. °· Smoke. °· Air pollutants such as dust, household cleaners, hair sprays, aerosol sprays, paint fumes, strong chemicals, or strong odors. °· Cold air, weather changes, and winds (which increase molds and pollens in the air). °· Strong emotional expressions such as crying or laughing hard. °· Stress. °· Certain medicines (such as aspirin) or types of drugs (such as beta-blockers). °· Sulfites in foods and drinks. Foods and drinks that  may contain sulfites include dried fruit, potato chips, and sparkling grape juice. °· Infections or inflammatory conditions such as the flu, a cold, or an inflammation of the nasal membranes (rhinitis). °· Gastroesophageal reflux disease (GERD). °· Exercise or strenuous activity. °SYMPTOMS °Symptoms may occur immediately after asthma is triggered or many hours later. Symptoms include: °· Wheezing. °· Excessive nighttime or early morning coughing. °· Frequent or severe coughing with a common cold. °· Chest tightness. °· Shortness of breath. °DIAGNOSIS  °The diagnosis of asthma is made by a review of your medical history and a physical exam. Tests may also be performed. These may include: °· Lung function studies. These tests show how much air you breathe in and out. °· Allergy tests. °· Imaging tests such as X-rays. °TREATMENT  °Asthma cannot be cured, but it can usually be controlled. Treatment involves identifying and avoiding your asthma triggers. It also involves medicines. There are 2 classes of medicine used for asthma treatment:  °· Controller medicines. These prevent asthma symptoms from occurring. They are usually taken every day. °· Reliever or rescue medicines. These quickly relieve asthma symptoms. They are used as needed and provide short-term relief. °Your health care provider will help you create an asthma action plan. An asthma action plan is a written plan for managing and treating your asthma attacks. It includes a list of your asthma triggers and how they   may be avoided. It also includes information on when medicines should be taken and when their dosage should be changed. An action plan may also involve the use of a device called a peak flow meter. A peak flow meter measures how well the lungs are working. It helps you monitor your condition. °HOME CARE INSTRUCTIONS  °· Take medicines only as directed by your health care provider. Speak with your health care provider if you have questions about  how or when to take the medicines. °· Use a peak flow meter as directed by your health care provider. Record and keep track of readings. °· Understand and use the action plan to help minimize or stop an asthma attack without needing to seek medical care. °· Control your home environment in the following ways to help prevent asthma attacks: °¨ Do not smoke. Avoid being exposed to secondhand smoke. °¨ Change your heating and air conditioning filter regularly. °¨ Limit your use of fireplaces and wood stoves. °¨ Get rid of pests (such as roaches and mice) and their droppings. °¨ Throw away plants if you see mold on them. °¨ Clean your floors and dust regularly. Use unscented cleaning products. °¨ Try to have someone else vacuum for you regularly. Stay out of rooms while they are being vacuumed and for a short while afterward. If you vacuum, use a dust mask from a hardware store, a double-layered or microfilter vacuum cleaner bag, or a vacuum cleaner with a HEPA filter. °¨ Replace carpet with wood, tile, or vinyl flooring. Carpet can trap dander and dust. °¨ Use allergy-proof pillows, mattress covers, and box spring covers. °¨ Wash bed sheets and blankets every week in hot water and dry them in a dryer. °¨ Use blankets that are made of polyester or cotton. °¨ Clean bathrooms and kitchens with bleach. If possible, have someone repaint the walls in these rooms with mold-resistant paint. Keep out of the rooms that are being cleaned and painted. °¨ Wash hands frequently. °SEEK MEDICAL CARE IF:  °· You have wheezing, shortness of breath, or a cough even if taking medicine to prevent attacks. °· The colored mucus you cough up (sputum) is thicker than usual. °· Your sputum changes from clear or white to yellow, green, gray, or bloody. °· You have any problems that may be related to the medicines you are taking (such as a rash, itching, swelling, or trouble breathing). °· You are using a reliever medicine more than 2-3 times per  week. °· Your peak flow is still at 50-79% of your personal best after following your action plan for 1 hour. °· You have a fever. °SEEK IMMEDIATE MEDICAL CARE IF:  °· You seem to be getting worse and are unresponsive to treatment during an asthma attack. °· You are short of breath even at rest. °· You get short of breath when doing very little physical activity. °· You have difficulty eating, drinking, or talking due to asthma symptoms. °· You develop chest pain. °· You develop a fast heartbeat. °· You have a bluish color to your lips or fingernails. °· You are light-headed, dizzy, or faint. °· Your peak flow is less than 50% of your personal best. °  °This information is not intended to replace advice given to you by your health care provider. Make sure you discuss any questions you have with your health care provider. °  °Document Released: 04/26/2005 Document Revised: 01/15/2015 Document Reviewed: 11/23/2012 °Elsevier Interactive Patient Education ©2016 [employer] ° °

## 2015-06-15 NOTE — ED Notes (Signed)
RT called for continuous neb, pt placed on cardiac and SPO2 monitor

## 2015-06-15 NOTE — ED Provider Notes (Signed)
  Pt signed out to me by Langston Masker, PA-C at end of shift.  Pt receiving continuous nebulizer.  Lungs sounds diminished with inspiratory and expiratory wheezing noted.  1800  Pt resting comfortably, neb completed.  On recheck, lung sounds improved.  Pt reports feeling better and requesting d/c.  Ambulated with pulse ox in dept w/o difficulty, no hypoxia.  Has albuterol nebs at home, rx given for prednisone.  Agrees to close PMD f/u if needed, appears stable for d/c   Pauline Aus, PA-C 06/15/15 1807  Loren Racer, MD 06/16/15 2042

## 2015-06-17 ENCOUNTER — Telehealth: Payer: Self-pay | Admitting: Internal Medicine

## 2015-06-17 MED ORDER — LEVOFLOXACIN 500 MG PO TABS: 500.0000 mg | ORAL_TABLET | Freq: Every day | ORAL | Status: DC

## 2015-06-17 NOTE — Telephone Encounter (Signed)
Pt returning call.Drew Sandoval ° °

## 2015-06-17 NOTE — Telephone Encounter (Signed)
Spoke with pt. Reports increased coughing, SOB, chest tightness and lots of wheezing. Cough is non productive. Was seen in the ED on 06/15/15 and was given neb treatment and prednisone taper. Onset of symptoms were 3-4 days ago. Has been using his albuterol nebs and albuterol HFA with minimal relief. Would further recommendations.  Allergies  Allergen Reactions  . Azithromycin Rash  . Alvesco [Ciclesonide]     Possible reaction- rash  . Anoro Ellipta [Umeclidinium-Vilanterol]     Possible- rash  . Banana Swelling    Throat swelling   . Penicillins Rash    Has patient had a PCN reaction causing immediate rash, facial/tongue/throat swelling, SOB or lightheadedness with hypotension: Yes Has patient had a PCN reaction causing severe rash involving mucus membranes or skin necrosis: No Has patient had a PCN reaction that required hospitalization Yes Has patient had a PCN reaction occurring within the last 10 years: No If all of the above answers are "NO", then may proceed with Cephalosporin use.     SN - please advise. Thanks.

## 2015-06-17 NOTE — Telephone Encounter (Signed)
Per SN: Add Levaquin  # 7 Take one PO daily Increase home nebs to every 4 hours Need rov to recheck with CY or TP ( Per Florentina Addison - Ok to give appt 06/25/15 at 11:30)  [facility] x 1

## 2015-06-17 NOTE — Telephone Encounter (Signed)
Attempted to contact pt. No answer, no option to leave a message. Will try back.  

## 2015-06-17 NOTE — Telephone Encounter (Signed)
Spoke with pt and advised of Dr Jodelle Green recommendations.  Rx for Levaquin sent to pharmacy.  Appt given with Dr Maple Hudson

## 2015-06-20 ENCOUNTER — Ambulatory Visit (INDEPENDENT_AMBULATORY_CARE_PROVIDER_SITE_OTHER): Payer: BLUE CROSS/BLUE SHIELD

## 2015-06-20 DIAGNOSIS — J309 Allergic rhinitis, unspecified: Secondary | ICD-10-CM

## 2015-06-23 ENCOUNTER — Telehealth: Payer: Self-pay | Admitting: Internal Medicine

## 2015-06-23 DIAGNOSIS — J309 Allergic rhinitis, unspecified: Secondary | ICD-10-CM

## 2015-06-23 NOTE — Telephone Encounter (Signed)
Allergy Serum Extract Date Mixed: 06/23/15 Vial: 2 Strength: 1:10 Here/Mail/Pick Up: here Mixed By: tbs Last OV: 05/22/15 Pending OV: 09/22/15

## 2015-06-25 ENCOUNTER — Encounter: Payer: Self-pay | Admitting: Internal Medicine

## 2015-06-25 ENCOUNTER — Ambulatory Visit (INDEPENDENT_AMBULATORY_CARE_PROVIDER_SITE_OTHER): Payer: BLUE CROSS/BLUE SHIELD | Admitting: Internal Medicine

## 2015-06-25 VITALS — BP 116/76 | HR 73 | Ht 76.0 in | Wt 158.0 lb

## 2015-06-25 DIAGNOSIS — J309 Allergic rhinitis, unspecified: Secondary | ICD-10-CM | POA: Diagnosis not present

## 2015-06-25 DIAGNOSIS — Z23 Encounter for immunization: Secondary | ICD-10-CM

## 2015-06-25 DIAGNOSIS — J302 Other seasonal allergic rhinitis: Secondary | ICD-10-CM

## 2015-06-25 DIAGNOSIS — J4550 Severe persistent asthma, uncomplicated: Secondary | ICD-10-CM

## 2015-06-25 DIAGNOSIS — J3089 Other allergic rhinitis: Secondary | ICD-10-CM

## 2015-06-25 MED ORDER — PREDNISONE 10 MG PO TABS: ORAL_TABLET | ORAL | Status: DC

## 2015-06-25 NOTE — Progress Notes (Signed)
Patient ID: Drew Sandoval, male    DOB: Oct 02, 1975    MRN: [id number]  HPI 11/04/10- 37 yobm former smoker followed for severe asthma, chronic sinusitis, hype-reosinophilia and hyper IgE  Sent to Dr [name]  by Dr [name] for allergy evaluation. 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 Allergy profile- 08/11/10 IgE 1,747 with broad specific elevation Last here- August 31, 2010.- note reviewed. Since last here has been in hospital again, getting out 2 weeks ago. Feels well today.  Comes today as planned for allergy skin testing Skin test: Strongly positive especially grass, tree, dust mite  01/04/11- 40 yo former smoker followed for severe asthma, chronic sinusitis, hyper-eosinophilia and hyper IgE.  wife is with him  .Allergy vaccine was started 11/09/2010 and is building without problems He was still on prednisone when he saw Dr [name] recently and at that time he seemed improved after a recent exacerbation- 2 weeks ago. About 2 weeks after their visit he began coughing again. He hasn't noted any unusual exposure at work, or any sense that he had a cold.  His last IgE was 1747.  He doesn't feel "sick"- just tight and wheezy.  02/18/11-  89 yo former smoker followed for severe asthma, chronic sinusitis, hyper-eosinophilia and hyper IgE.  wife is with him Cooler weather is more comfortable for him. He continues to build allergy vaccine, now at 1:500, without problems. Some days continued to be better than others. 2 days ago thought his eyes were starting to itch and took Benadryl. We discussed comparison antihistamines. Not much sneezing. He has not needed his rescue inhaler in a long time but continues prednisone 20 mg daily. We educated on prednisone again.  04/20/11- 40 yo former smoker followed for severe asthma, chronic sinusitis, hyper-eosinophilia and hyper IgE.  wife is with him Went to ER 3 days ago- got neb and prednisone 60 mg x1. He finished  our last taper yesterday. Has had flu shot. Bending over, long walks or climbing stairs, and long work shifts all make him short of breath with wheezing. He worked last night and now feels "65%". He does not think he has an acute cold or infection. He was last really well about 5 days ago. He denies heartburn. He continues building allergy vaccine now at 1:50, without problems. IgE level has been out of range for Xolair.  07/19/11- 40 yo former smoker followed for severe asthma, chronic sinusitis, hyper-eosinophilia and hyper IgE. Separated from wife have now living with his mother. Asthma control is "a whole lot better". He has not missed work. Did have to go to emergency room for kidney stone. Questions allergy vaccine causing "dark spots" on his neck. Using prednisone only occasionally. No routine wheeze, does short of breath at times.  11/18/11- 40 yo former smoker followed for severe asthma, chronic sinusitis, hyper-eosinophilia and hyper IgE. Went to ER for approximately 4 asthma attacks in the past month; He has continued allergy vaccine at 1:50. He is needed his nebulizer 2 or 3 times daily and occasional rescue inhaler. He denies infection or change in workplace exposure. He does not recognize reflux. He usually flares as soon as he finishes the prednisone taper so he and his wife asked about maintenance prednisone which we discussed. Carefully. Sinus congestion tends to parallel his asthma episodes but he denies purulent nasal discharge or headache. We again discussed the significance of his elevated IgE and eosinophil levels. His IgE has been  too high for Xolair but we will reassess. CXR 11/11/11-  IMPRESSION: No active disease. Hyperinflation again noted.  Original Report Authenticated By: Natasha MeadLIVIU POP, M.D.   02/07/12- 40 yo former smoker followed for severe asthma, chronic sinusitis, hyper-eosinophilia and hyper IgE. FOLLOWS FOR: [facility] through  02/04/2012. DC summary reviewed. Discharge diagnosis asthma. Asthma began during the night. He took his own nebulizer, felt better and tried to go to work but had to leave. [facility] had [facility] where he was treated with 3 nebulizer treatments and sent out. [facility]. Discharged on a steroid taper. They comment in the summary that he was significantly depressed because he had watched his father died of asthma. He admits today that he had not told us of that history before. He feels better now but still wheezing and washed out as he tapers prednisone. He denies any sense of reflux or any recognized triggering experience. In the past he had thought there was something associated with his work place as discussed in earlier notes. He comments today that he can't breathe if he sleeps on his left side. There is no history of heart disease. He quit allergy vaccine in April, 2013.  11/19/13- 37 yo former smoker followed for severe asthma, chronic sinusitis, hyper-eosinophilia and hyper IgE- too high for Xolair.  FOLLOWS FOR:  Reports has good and bad days with breathing.  When having bad day, sob, wheezing and cough non-productive.  Hasn't done Allergy vaccine in 3-4 months due to no ride here to have done Hospital admission in March, emergency room visits in May and June because of asthma/cough. He denies any awareness of reflux, adenopathy, post nasal drainage. Cough is dry or scant white sputum. Has cough syrup at night but Tessalon does not help and daytime. He feels tight today. Last prednisone several weeks ago. Easy dyspnea on exertion without chest pain, palpitation, edema. 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 CXR 10/28/13- IMPRESSION:  Hyperinflation, otherwise negative. No change from prior study.  Electronically Signed  rec Continue routine meds Script for tramadol to try for  daytime cough You can also use an otc cough med like Delsym in between doses of tramadol if needed Script to try theophylline for breathing and cough. Take this med regularly, 1 twice daily after a meal, with food in your stomach. If it too strong, break it in half and take 1/2 tab twice daily We are stopping allergy shots for now, since you can't get down here easily.   12/10/2013 f/u ov/Wert re:  Severe asthma/ hyper E sp smoking cessation 2011/ 30 mg pred per day  Chief Complaint  Patient presents with  . Follow-up    Pt c/o wheezing with exertion, sometimes prod cough with clear mucus X3 months.  Recently saw CY for this, is being seen today to get a work release note.    breathing fine now and not needing rescue, typical of how he feels on prednisone, no better on theoph in terms of sob or need for saba No cough meds during the day, tramdol not helpful, does fine p tussionex, produces clear mucus each am.  No obvious day to day or daytime variabilty or assoc  cp or chest tightness, subjective wheeze overt sinus or hb symptoms. No unusual exp hx or h/o childhood pna/ asthma or knowledge of premature  birth. Sleeping ok p tussionex without nocturnal  or early am exacerbation  of respiratory  c/o's or need for noct saba. Also denies any obvious fluctuation of symptoms with weather or environmental changes or other aggravating or alleviating factors except as outlined above   01/24/14- 38 yoM former smoker followed for severe asthma, chronic sinusitis, hyper-eosinophilia and hyper IgE- too high for Xolair.  FOLLOWS FOR: Pt last seen by MW on 12/10/13. Pt has stopped with his vaccine for 1 year but wants to start up again d/t itchy eyes, runny nose, prod cough. Pt denies SOB and CP/tightness.   03/22/14- 78 yoM former smoker followed for severe asthma, chronic sinusitis, hyper-eosinophilia and hyper IgE- too high for Xolair FOLLOWS FOR: had attack yesterday. Pt states he lets his breathing get bad. Like  Symbicort better than Dulera and needs RX.We called in Pred taper yesterday.  Had flu vaccine. 3 weeks ago asthma began getting bad and really flared yesterday. Blames smell of burning leaves outdoors. Doesn't think he has a cold. Admits he let himself get out of hand before calling. Started prednisone last night and today feels much better.  07/17/14- 38 yoM former smoker followed for severe asthma, chronic sinusitis, hyper-eosinophilia and hyper IgE- too high for Xolair FOLLOWS FOR: Pt had attack last week-was given Prednisone; pt feels m-pred helps better. Also, patient needs refill for Singulair and Tessalon. Discuss allergy injections. Wants to try allergy shots again now that he has insurance to cover it. Blames recent exacerbation on weather change. Noticing increased sinus drainage without sneeze or headache.  09/19/14- 38 yoM former smoker followed for severe asthma, chronic sinusitis, hyper-eosinophilia and hyper IgE- too high for Xolair Follows For: Asthma is well controlled. C/o dry cough. After allergy shot in 09/17/14 right eye became swollen, red and tender.   09/24/14-  76 yoM former smoker followed for severe asthma, chronic sinusitis, hyper-eosinophilia and hyper IgE- too high for Xolair ACUTE VISIT: Flare up-cant walk 5 steps without getting SOB. Went to [facility] ER Sunday fpor asthma flare, with myalgias-currently on 60 mg of Prednisone.  Patient arrived at this office today for allergy shot. Noted weak, dyspneic, tachycardia. EKG 09/24/14 shows regular SVT at 160.  CXR 09/21/14- I reviewed image and agree IMPRESSION: RIGHT middle lobe partial collapse. Similar hyperinflation. Electronically Signed  By: Awilda Metro  On: 09/21/2014 06:51  10/09/14- 38 yoM former smoker followed for severe asthma, chronic sinusitis, hyper-eosinophilia and hyper IgE- too high for Xolair, hx SVT Reports: pt. still able to walk the 5 steps without getting SOB,wheezing a couple days ago,        Wife here Hosp f/u:5/17-5/18- exac asthma and SVT requiring cardioversion Now feels well, no wheeze on prednisone. Reviewed labs- Eos 14%,absolute 1.0( 0.0-0.7) Continues allergy vaccine building at 1:500 [facility] Office spirometry 10/09/14- Severe obstructive disease- FVC 2.63/ 52%, FEV1 0.97/ 24%, FEV1/FVC 0.37, FEF25-75% 0.36/ 8% Eosinophils 14% Allergy profile done 07/17/2014 showed very high total IgE 3283  01/20/15-  38 yoM former smoker followed for severe asthma, chronic sinusitis, hyper-eosinophilia and hyper IgE- too high for Xolair, hx SVT/ cardioversion Allergy vaccine 1:50GH    IgE too high for Xolair, Eos not high enough for Nucala FOLLOWS FOR: Pt continues to have SOB. Last ER 8/30 ER twice at end of August- prednisone 7 days, abx twice. Still feels tight. We had started maintenance prednisone and he was good, but let himself run out-> flare. CT chest 01/05/15 IMPRESSION: Left upper lobe peribronchial thickening suggesting bronchitis.  Consolidation of the right middle lobe with air bronchograms. Favor atelectasis but superimposed pneumonia is not excluded. No endobronchial mass is identified. Electronically Signed  By: Sherian Rein M.D.  On: 01/05/2015 08:13  05/22/2015-40 year old male former smoker followed for severe asthma/Nucala, chronic sinusitis, hypereosinophilia and hyper IgE-too high for Xolair, history SVT/cardioversion Allergy vaccine 1:50GH    IgE too high for Xolair, Nucala begun FOLLOWS FOR: pt. states breathing has improved. dry cough. denies wheezing, SOB or chest pain/tightness. Feels well at today's visit. Last prednisone ended over a week ago. CXR 05/05/15-no acute process  06/25/2015-40 year old male former smoker followed for severe asthma/Nucala, chronic sinusitis, hypereosinophilia and hyper IgE-too high for Xolair, history SVT/cardioversion Allergy vaccine 1:50GH  FOLLOWS FOR: Pt continues allergy vaccine and no reactions; currently holding off on  Nucala due to finances and wife sick this worked out this week. Has had only a single shot. Feels well.  ROS-see HPI   Negative unless "+" Constitutional:    weight loss, night sweats, fevers, chills,+ fatigue, lassitude. HEENT:    headaches, difficulty swallowing, tooth/dental problems, sore throat,       sneezing, itching, ear ache, +nasal congestion, post nasal drip, snoring CV:    chest pain, orthopnea, PND, swelling in lower extremities, anasarca,                                                        dizziness, palpitations Resp:   +shortness of breath with exertion or at rest.                productive cough,   +non-productive cough, coughing up of blood.              change in color of mucus.  wheezing.   Skin:    rash or lesions. GI:  No-   heartburn, indigestion, abdominal pain, nausea, vomiting,  GU: d. MS:   joint pain, stiffness, decreased range of motion, back pain. Neuro-     nothing unusual Psych:  change in mood or affect.  depression or anxiety.   memory loss.  Objective:   Physical Exam General- Alert, Oriented, Affect-uncomfortable, near tears, Distress-none acute;  Tall, thin Skin- clear Lymphadenopathy- none Head- atraumatic            Eyes- Gross vision intact, PERRLA, conjunctivae clear secretions            Ears- Hearing, canals normal            Nose- Clear, no-Septal dev, mucus, polyps, erosion, perforation             Throat- Mallampati II , mucosa clear , drainage- none, tonsils- atrophic Neck- flexible , trachea midline, no stridor , thyroid nl, carotid no bruit Chest - symmetrical excursion , unlabored           Heart/CV- RRR rapid , no murmur , no gallop  , no rub, nl s1 s2                           - JVD- none , edema- none, stasis changes- none, varices- none           Lung-   diminished, unlabored,  Cough-none , dullness-none, rub- none,  Wheeze-none Abd- scaphoid  Br/ Gen/ Rectal- Not done, not indicated Extrem-  cyanosis- none, clubbing, none,  atrophy- none, strength- nl Neuro- +slight tremor

## 2015-06-25 NOTE — Patient Instructions (Signed)
I really hope you can get restarted on Nucala and stick with it. We want to help you stay well, off prednisone and out of the emergency room  Prednisone script sent to use if necessary  Please stay in touch so we know how you are doing.  I hope your wife gets better quickly.

## 2015-06-25 NOTE — Assessment & Plan Note (Addendum)
Uncomplicated at this visit. Unfortunately he has not been able to start maintenance Nucala but expects he will soon. Plan-prednisone taper prescription to hold with discussion

## 2015-06-25 NOTE — Assessment & Plan Note (Signed)
He continues allergy vaccine without problems

## 2015-06-27 ENCOUNTER — Ambulatory Visit (INDEPENDENT_AMBULATORY_CARE_PROVIDER_SITE_OTHER): Payer: BLUE CROSS/BLUE SHIELD

## 2015-06-27 DIAGNOSIS — J309 Allergic rhinitis, unspecified: Secondary | ICD-10-CM

## 2015-07-04 ENCOUNTER — Ambulatory Visit (INDEPENDENT_AMBULATORY_CARE_PROVIDER_SITE_OTHER): Payer: BLUE CROSS/BLUE SHIELD

## 2015-07-04 DIAGNOSIS — J309 Allergic rhinitis, unspecified: Secondary | ICD-10-CM

## 2015-07-11 ENCOUNTER — Ambulatory Visit (INDEPENDENT_AMBULATORY_CARE_PROVIDER_SITE_OTHER): Payer: BLUE CROSS/BLUE SHIELD

## 2015-07-11 DIAGNOSIS — J309 Allergic rhinitis, unspecified: Secondary | ICD-10-CM

## 2015-07-14 ENCOUNTER — Other Ambulatory Visit: Payer: Self-pay | Admitting: Internal Medicine

## 2015-07-18 ENCOUNTER — Ambulatory Visit (INDEPENDENT_AMBULATORY_CARE_PROVIDER_SITE_OTHER): Payer: BLUE CROSS/BLUE SHIELD | Admitting: *Deleted

## 2015-07-18 DIAGNOSIS — J309 Allergic rhinitis, unspecified: Secondary | ICD-10-CM | POA: Diagnosis not present

## 2015-07-22 ENCOUNTER — Other Ambulatory Visit: Payer: Self-pay | Admitting: Internal Medicine

## 2015-07-23 ENCOUNTER — Telehealth: Payer: Self-pay | Admitting: Internal Medicine

## 2015-07-23 NOTE — Telephone Encounter (Signed)
CY Please advise on refill. Thanks.  

## 2015-07-23 NOTE — Telephone Encounter (Signed)
Spoke with pt, requesting a note to return to work tomorrow with no restrictions-pt was not seen in office today.  Pt did have another telephone encounter in which he declined further recommendations from CY.  Spoke with CY, ok'ed letter for return to work tomorrow.  Letter typed, signed by CY, and given to pt.  Nothing further needed.

## 2015-07-23 NOTE — Telephone Encounter (Signed)
Ok to refill 

## 2015-07-23 NOTE — Telephone Encounter (Signed)
Spoke with pt. States that he is having an asthma attack and needs prednisone. His prednisone rx can't be refilled until Monday due to his insurance. Advised him that unfortunately we can't do anything about that. Tried to get some symptoms from him to get the message to [facility]. He stated, "Forget it! I will just go to the hospital." I tried again to get symptoms from him and he hung up on me. Message will be closed.

## 2015-07-25 ENCOUNTER — Ambulatory Visit (INDEPENDENT_AMBULATORY_CARE_PROVIDER_SITE_OTHER): Payer: BLUE CROSS/BLUE SHIELD | Admitting: *Deleted

## 2015-07-25 DIAGNOSIS — J309 Allergic rhinitis, unspecified: Secondary | ICD-10-CM | POA: Diagnosis not present

## 2015-08-01 ENCOUNTER — Ambulatory Visit (INDEPENDENT_AMBULATORY_CARE_PROVIDER_SITE_OTHER): Payer: BLUE CROSS/BLUE SHIELD

## 2015-08-01 DIAGNOSIS — J309 Allergic rhinitis, unspecified: Secondary | ICD-10-CM | POA: Diagnosis not present

## 2015-08-08 ENCOUNTER — Ambulatory Visit (INDEPENDENT_AMBULATORY_CARE_PROVIDER_SITE_OTHER): Payer: BLUE CROSS/BLUE SHIELD | Admitting: *Deleted

## 2015-08-08 DIAGNOSIS — J309 Allergic rhinitis, unspecified: Secondary | ICD-10-CM

## 2015-08-15 ENCOUNTER — Ambulatory Visit (INDEPENDENT_AMBULATORY_CARE_PROVIDER_SITE_OTHER): Payer: BLUE CROSS/BLUE SHIELD

## 2015-08-15 DIAGNOSIS — J309 Allergic rhinitis, unspecified: Secondary | ICD-10-CM | POA: Diagnosis not present

## 2015-08-25 ENCOUNTER — Telehealth: Payer: Self-pay | Admitting: Internal Medicine

## 2015-08-25 MED ORDER — DOXYCYCLINE HYCLATE 100 MG PO TABS: 100.0000 mg | ORAL_TABLET | Freq: Two times a day (BID) | ORAL | Status: DC

## 2015-08-25 NOTE — Telephone Encounter (Signed)
Called, spoke with pt.  Pt reports on Friday, he had temp up to 105 (verified with pt temp was 105 not 100.5), chills, chest tightness, body aches, nonprod cough, and chest congestion.  Pt reports fever has now broke but other symptoms are persisting.  Denies PND, SOB.  Using albuterol hfa and albuterol neb with some relief.  Requesting further recs.  Dr. [name], please advise.  Thank you.  Last OV with CY: 06/25/15; rec to f/u in 4 months Pending OV with CY 09/22/15  Allergies  Allergen Reactions  . Azithromycin Rash  . Alvesco [Ciclesonide]     Possible reaction- rash  . Anoro Ellipta [Umeclidinium-Vilanterol]     Possible- rash  . Banana Swelling    Throat swelling   . Penicillins Rash    Has patient had a PCN reaction causing immediate rash, facial/tongue/throat swelling, SOB or lightheadedness with hypotension: Yes Has patient had a PCN reaction causing severe rash involving mucus membranes or skin necrosis: No Has patient had a PCN reaction that required hospitalization Yes Has patient had a PCN reaction occurring within the last 10 years: No If all of the above answers are "NO", then may proceed with Cephalosporin use.       Current Outpatient Prescriptions on File Prior to Visit  Medication Sig Dispense Refill  . albuterol (PROVENTIL HFA;VENTOLIN HFA) 108 (90 BASE) MCG/ACT inhaler Inhale 2 puffs into the lungs every 4 (four) hours as needed. 6.7 g 0  . albuterol (PROVENTIL) (2.5 MG/3ML) 0.083% nebulizer solution Take 3 mLs (2.5 mg total) by nebulization every 6 (six) hours as needed for wheezing or shortness of breath. 360 mL 5  . budesonide-formoterol (SYMBICORT) 160-4.5 MCG/ACT inhaler 2 puffs twice daily, then rinse mouth (Patient taking differently: Inhale 2 puffs into the lungs 2 (two) times daily. then rinse mouth) 1 Inhaler 6  . EPINEPHrine 0.3 mg/0.3 mL IJ SOAJ injection Inject into thigh for severe allergic reaction (Patient taking differently: Inject 0.3 mg into the  muscle once. Inject into thigh for severe allergic reaction) 1 Device prn  . montelukast (SINGULAIR) 10 MG tablet TAKE 1 TABLET BY MOUTH AT BEDTIME 30 tablet 0  . NON FORMULARY Allergy vaccines 1:50 weekly GH    . predniSONE (DELTASONE) 10 MG tablet 6,5,4,3,2,1 taper 21 tablet 3  . traMADol (ULTRAM) 50 MG tablet TAKE 1 TABLET BY MOUTH EVERY 8 HOURS AS NEEDED FOR COUGH 40 tablet 0   No current facility-administered medications on file prior to visit.

## 2015-08-25 NOTE — Telephone Encounter (Signed)
Called, spoke with pt. Discussed below recs per Dr. [name].  Pt verbalized understanding and is aware [facility] [place].  Pt is to call back if symptoms do not improve or worsen and is to seek emergency care if needed.

## 2015-08-25 NOTE — Telephone Encounter (Signed)
Doxycycline 100 mg, # 14, 1 twice daily 

## 2015-08-27 ENCOUNTER — Other Ambulatory Visit: Payer: Self-pay | Admitting: Internal Medicine

## 2015-09-01 ENCOUNTER — Other Ambulatory Visit: Payer: Self-pay | Admitting: Internal Medicine

## 2015-09-16 ENCOUNTER — Other Ambulatory Visit: Payer: Self-pay | Admitting: Internal Medicine

## 2015-09-21 IMAGING — CR DG CHEST 1V PORT
1 series · 2 of 2 positions shown · non-contrast
Comparison: Chest radiograph performed 09/24/2014

CLINICAL DATA: Acute onset of shortness of breath, wheezing and
productive cough. Left lower lateral chest pain. Initial encounter.

EXAM:
PORTABLE CHEST - 1 VIEW

[Series 1: ap portable · 0.17mm/px · 2 of 2 slices shown]
[im 1/2]
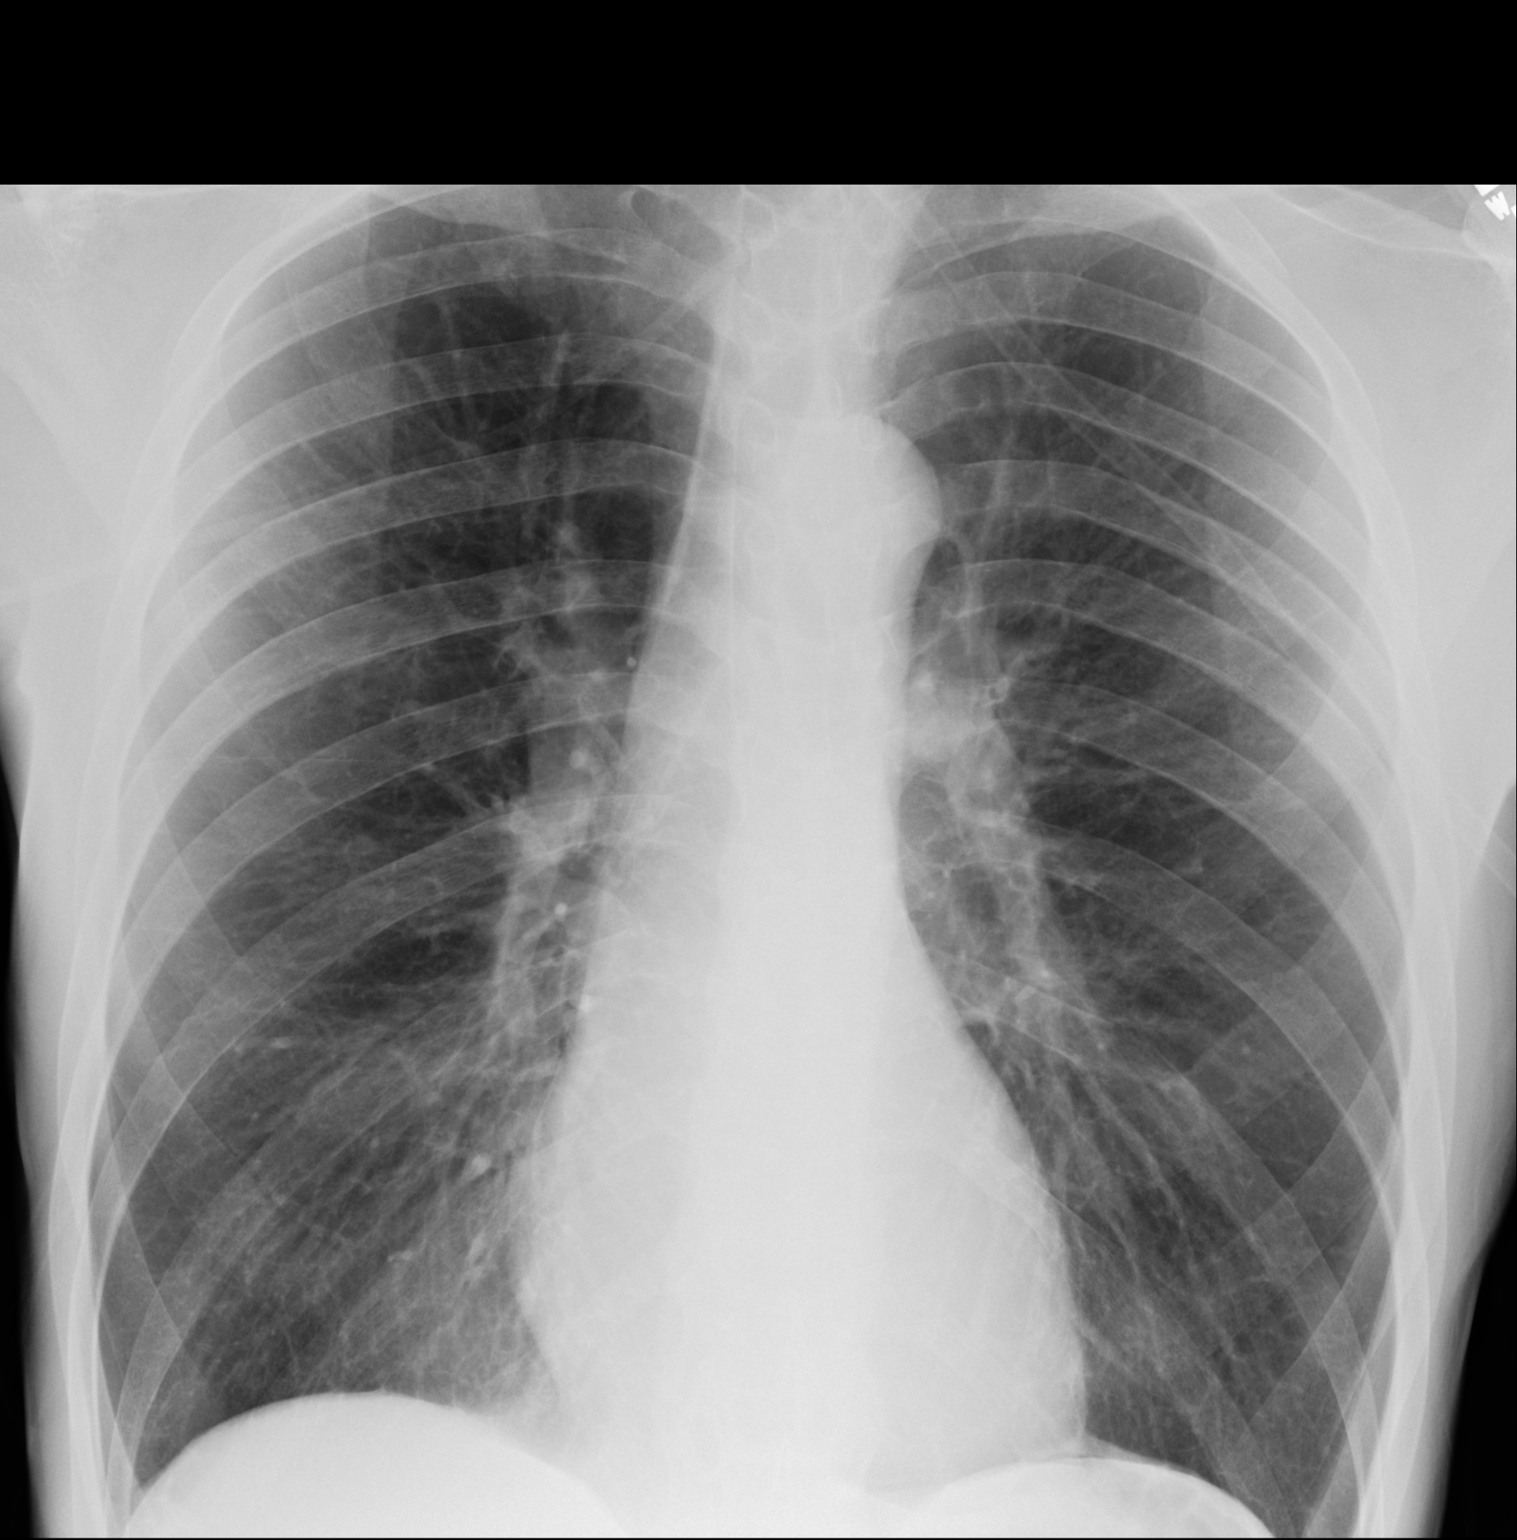
[im 2/2]
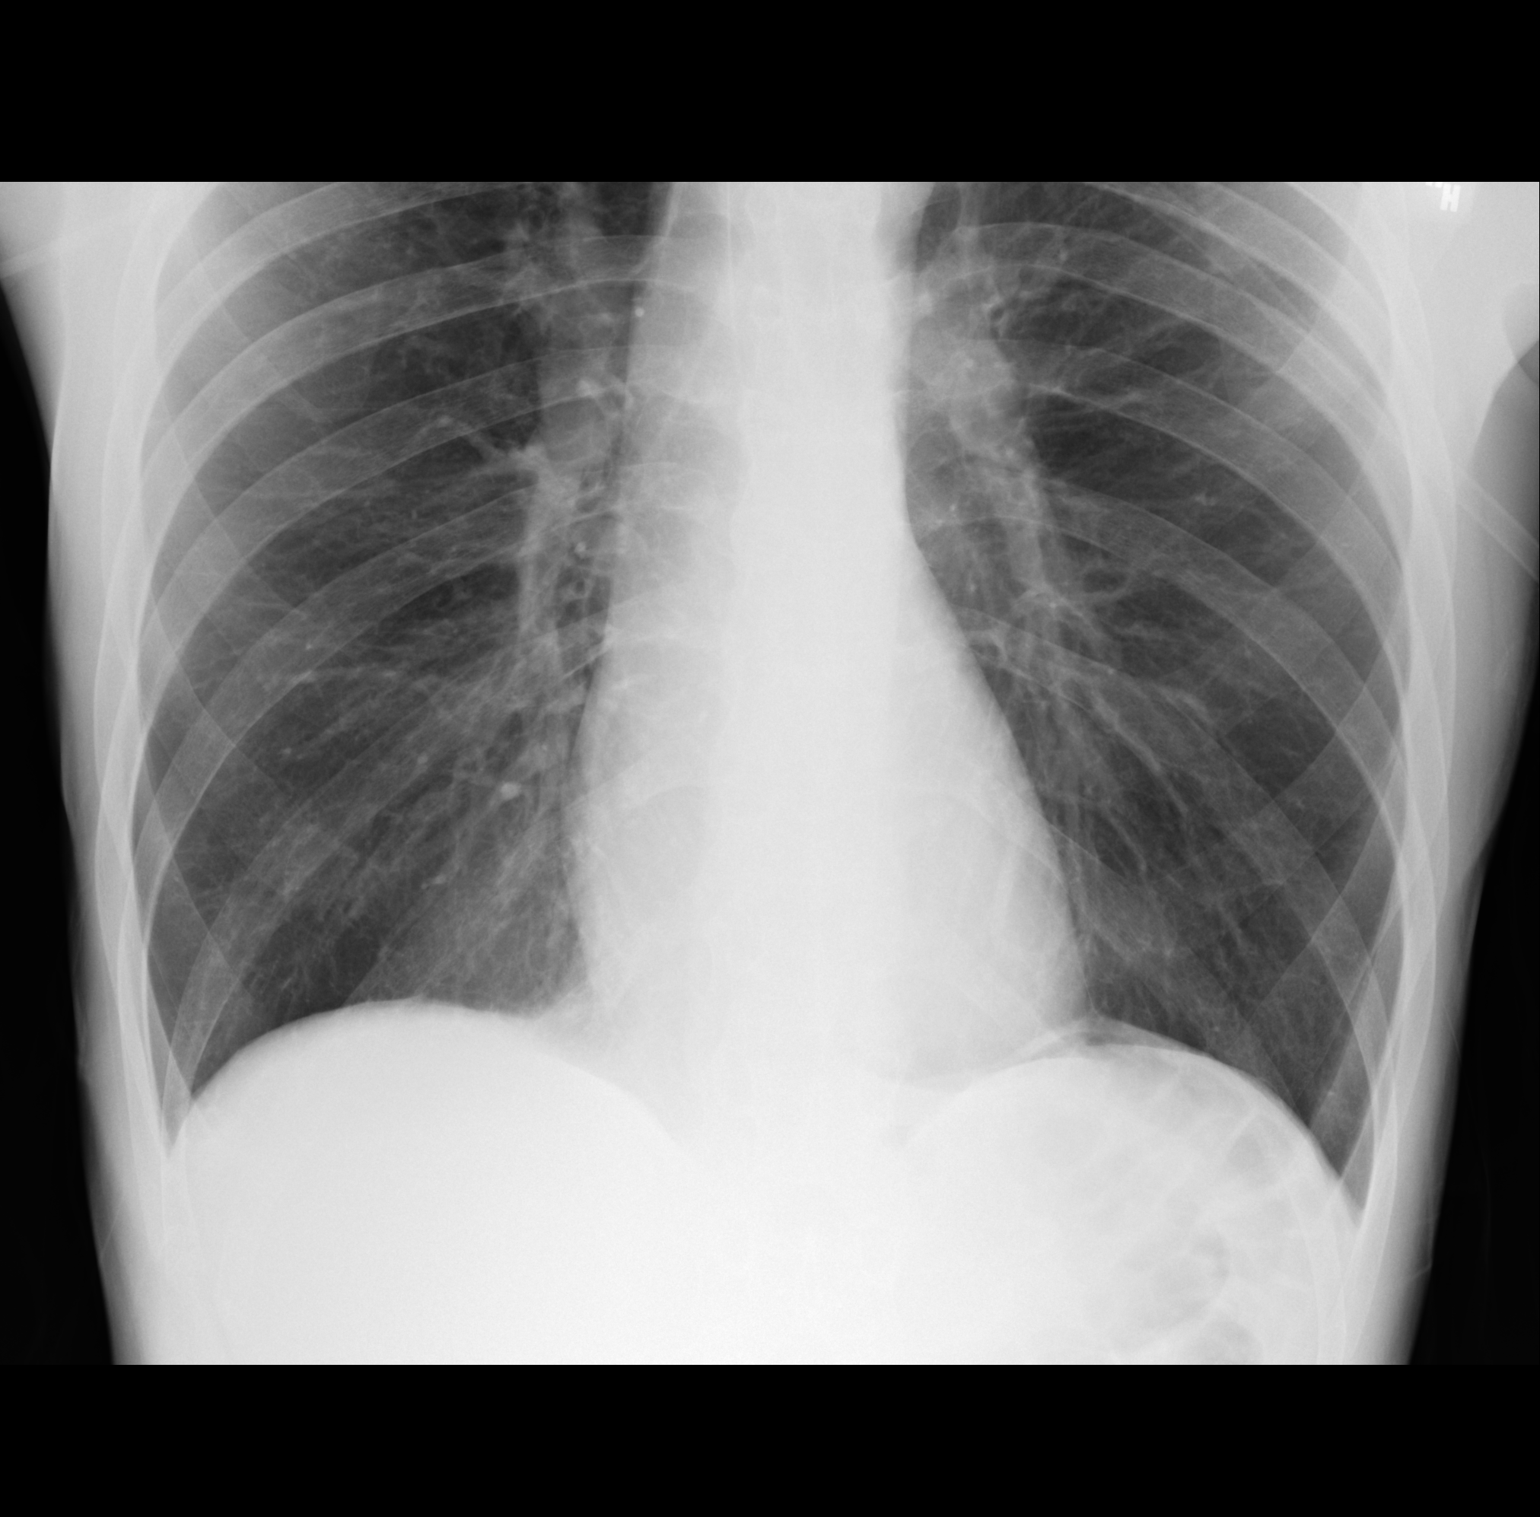

[2 of 2 positions shown; findings below may reference images not displayed]

FINDINGS: The lungs are well-aerated and clear. There is no evidence of focal
opacification, pleural effusion or pneumothorax.

The cardiomediastinal silhouette is within normal limits. No acute
osseous abnormalities are seen.
IMPRESSION: No acute cardiopulmonary process seen.

## 2015-09-22 ENCOUNTER — Ambulatory Visit (INDEPENDENT_AMBULATORY_CARE_PROVIDER_SITE_OTHER): Payer: BLUE CROSS/BLUE SHIELD | Admitting: Internal Medicine

## 2015-09-22 ENCOUNTER — Encounter: Payer: Self-pay | Admitting: Internal Medicine

## 2015-09-22 VITALS — BP 124/60 | HR 59 | Ht 76.0 in | Wt 159.2 lb

## 2015-09-22 DIAGNOSIS — J4550 Severe persistent asthma, uncomplicated: Secondary | ICD-10-CM | POA: Diagnosis not present

## 2015-09-22 MED ORDER — ALBUTEROL SULFATE (2.5 MG/3ML) 0.083% IN NEBU: 2.5000 mg | INHALATION_SOLUTION | Freq: Four times a day (QID) | RESPIRATORY_TRACT | Status: DC | PRN

## 2015-09-22 NOTE — Progress Notes (Signed)
Patient ID: Drew Sandoval, male    DOB: Oct 02, 1975    MRN: [id number]  HPI 11/04/10- 37 yobm former smoker followed for severe asthma, chronic sinusitis, hype-reosinophilia and hyper IgE  Sent to Dr [name]  by Dr [name] for allergy evaluation. 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 Allergy profile- 08/11/10 IgE 1,747 with broad specific elevation Last here- August 31, 2010.- note reviewed. Since last here has been in hospital again, getting out 2 weeks ago. Feels well today.  Comes today as planned for allergy skin testing Skin test: Strongly positive especially grass, tree, dust mite  01/04/11- 40 yo former smoker followed for severe asthma, chronic sinusitis, hyper-eosinophilia and hyper IgE.  wife is with him  .Allergy vaccine was started 11/09/2010 and is building without problems He was still on prednisone when he saw Dr [name] recently and at that time he seemed improved after a recent exacerbation- 2 weeks ago. About 2 weeks after their visit he began coughing again. He hasn't noted any unusual exposure at work, or any sense that he had a cold.  His last IgE was 1747.  He doesn't feel "sick"- just tight and wheezy.  02/18/11-  89 yo former smoker followed for severe asthma, chronic sinusitis, hyper-eosinophilia and hyper IgE.  wife is with him Cooler weather is more comfortable for him. He continues to build allergy vaccine, now at 1:500, without problems. Some days continued to be better than others. 2 days ago thought his eyes were starting to itch and took Benadryl. We discussed comparison antihistamines. Not much sneezing. He has not needed his rescue inhaler in a long time but continues prednisone 20 mg daily. We educated on prednisone again.  04/20/11- 40 yo former smoker followed for severe asthma, chronic sinusitis, hyper-eosinophilia and hyper IgE.  wife is with him Went to ER 3 days ago- got neb and prednisone 60 mg x1. He finished  our last taper yesterday. Has had flu shot. Bending over, long walks or climbing stairs, and long work shifts all make him short of breath with wheezing. He worked last night and now feels "65%". He does not think he has an acute cold or infection. He was last really well about 5 days ago. He denies heartburn. He continues building allergy vaccine now at 1:50, without problems. IgE level has been out of range for Xolair.  07/19/11- 40 yo former smoker followed for severe asthma, chronic sinusitis, hyper-eosinophilia and hyper IgE. Separated from wife have now living with his mother. Asthma control is "a whole lot better". He has not missed work. Did have to go to emergency room for kidney stone. Questions allergy vaccine causing "dark spots" on his neck. Using prednisone only occasionally. No routine wheeze, does short of breath at times.  11/18/11- 40 yo former smoker followed for severe asthma, chronic sinusitis, hyper-eosinophilia and hyper IgE. Went to ER for approximately 4 asthma attacks in the past month; He has continued allergy vaccine at 1:50. He is needed his nebulizer 2 or 3 times daily and occasional rescue inhaler. He denies infection or change in workplace exposure. He does not recognize reflux. He usually flares as soon as he finishes the prednisone taper so he and his wife asked about maintenance prednisone which we discussed. Carefully. Sinus congestion tends to parallel his asthma episodes but he denies purulent nasal discharge or headache. We again discussed the significance of his elevated IgE and eosinophil levels. His IgE has been  too high for Xolair but we will reassess. CXR 11/11/11-  IMPRESSION: No active disease. Hyperinflation again noted.  Original Report Authenticated By: Natasha MeadLIVIU POP, M.D.   02/07/12- 40 yo former smoker followed for severe asthma, chronic sinusitis, hyper-eosinophilia and hyper IgE. FOLLOWS FOR: [facility] through  02/04/2012. DC summary reviewed. Discharge diagnosis asthma. Asthma began during the night. He took his own nebulizer, felt better and tried to go to work but had to leave. He had [facility] where he was treated with 3 nebulizer treatments and sent out. He went from there to [facility]. Discharged on a steroid taper. They comment in the summary that he was significantly depressed because he had watched his father died of asthma. He admits today that he had not told us of that history before. He feels better now but still wheezing and washed out as he tapers prednisone. He denies any sense of reflux or any recognized triggering experience. In the past he had thought there was something associated with his work place as discussed in earlier notes. He comments today that he can't breathe if he sleeps on his left side. There is no history of heart disease. He quit allergy vaccine in April, 2013.  11/19/13- 40 yo former smoker followed for severe asthma, chronic sinusitis, hyper-eosinophilia and hyper IgE- too high for Xolair.  FOLLOWS FOR:  Reports has good and bad days with breathing.  When having bad day, sob, wheezing and cough non-productive.  Hasn't done Allergy vaccine in 3-4 months due to no ride here to have done Hospital admission in March, emergency room visits in May and June because of asthma/cough. He denies any awareness of reflux, adenopathy, post nasal drainage. Cough is dry or scant white sputum. Has cough syrup at night but Tessalon does not help and daytime. He feels tight today. Last prednisone several weeks ago. Easy dyspnea on exertion without chest pain, palpitation, edema. 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 CXR 10/28/13- IMPRESSION:  Hyperinflation, otherwise negative. No change from prior study.  Electronically Signed  rec Continue routine meds Script for tramadol to try for  daytime cough You can also use an otc cough med like Delsym in between doses of tramadol if needed Script to try theophylline for breathing and cough. Take this med regularly, 1 twice daily after a meal, with food in your stomach. If it too strong, break it in half and take 1/2 tab twice daily We are stopping allergy shots for now, since you can't get down here easily.   12/10/2013 f/u ov/Wert re:  Severe asthma/ hyper E sp smoking cessation 2011/ 30 mg pred per day  Chief Complaint  Patient presents with  . Follow-up    Pt c/o wheezing with exertion, sometimes prod cough with clear mucus X3 months.  Recently saw CY for this, is being seen today to get a work release note.    breathing fine now and not needing rescue, typical of how he feels on prednisone, no better on theoph in terms of sob or need for saba No cough meds during the day, tramdol not helpful, does fine p tussionex, produces clear mucus each am.  No obvious day to day or daytime variabilty or assoc  cp or chest tightness, subjective wheeze overt sinus or hb symptoms. No unusual exp hx or h/o childhood pna/ asthma or knowledge of premature  birth. Sleeping ok p tussionex without nocturnal  or early am exacerbation  of respiratory  c/o's or need for noct saba. Also denies any obvious fluctuation of symptoms with weather or environmental changes or other aggravating or alleviating factors except as outlined above   01/24/14- 38 yoM former smoker followed for severe asthma, chronic sinusitis, hyper-eosinophilia and hyper IgE- too high for Xolair.  FOLLOWS FOR: Pt last seen by MW on 12/10/13. Pt has stopped with his vaccine for 1 year but wants to start up again d/t itchy eyes, runny nose, prod cough. Pt denies SOB and CP/tightness.   03/22/14- 89 yoM former smoker followed for severe asthma, chronic sinusitis, hyper-eosinophilia and hyper IgE- too high for Xolair FOLLOWS FOR: had attack yesterday. Pt states he lets his breathing get bad. Like  Symbicort better than Dulera and needs RX.We called in Pred taper yesterday.  Had flu vaccine. 3 weeks ago asthma began getting bad and really flared yesterday. Blames smell of burning leaves outdoors. Doesn't think he has a cold. Admits he let himself get out of hand before calling. Started prednisone last night and today feels much better.  07/17/14- 38 yoM former smoker followed for severe asthma, chronic sinusitis, hyper-eosinophilia and hyper IgE- too high for Xolair FOLLOWS FOR: Pt had attack last week-was given Prednisone; pt feels m-pred helps better. Also, patient needs refill for Singulair and Tessalon. Discuss allergy injections. Wants to try allergy shots again now that he has insurance to cover it. Blames recent exacerbation on weather change. Noticing increased sinus drainage without sneeze or headache.  09/19/14- 38 yoM former smoker followed for severe asthma, chronic sinusitis, hyper-eosinophilia and hyper IgE- too high for Xolair Follows For: Asthma is well controlled. C/o dry cough. After allergy shot in 09/17/14 right eye became swollen, red and tender.   09/24/14-  [age] yoM former smoker followed for severe asthma, chronic sinusitis, hyper-eosinophilia and hyper IgE- too high for Xolair ACUTE VISIT: Flare up-cant walk 5 steps without getting SOB. Went to [facility] ER Sunday fpor asthma flare, with myalgias-currently on 60 mg of Prednisone.  Patient arrived at this office today for allergy shot. Noted weak, dyspneic, tachycardia. EKG 09/24/14 shows regular SVT at 160.  CXR 09/21/14- I reviewed image and agree IMPRESSION: RIGHT middle lobe partial collapse. Similar hyperinflation. Electronically Signed  By: Awilda Metroourtnay Bloomer  On: 09/21/2014 06:51  10/09/14- 38 yoM former smoker followed for severe asthma, chronic sinusitis, hyper-eosinophilia and hyper IgE- too high for Xolair, hx SVT Reports: pt. still able to walk the 5 steps without getting SOB,wheezing a couple days ago,        Wife here Hosp f/u:5/17-5/18- exac asthma and SVT requiring cardioversion Now feels well, no wheeze on prednisone. Reviewed labs- Eos 14%,absolute 1.0( 0.0-0.7) Continues allergy vaccine building [facility] 10/09/14- Severe obstructive disease- FVC 2.63/ 52%, FEV1 0.97/ 24%, FEV1/FVC 0.37, FEF25-75% 0.36/ 8% Eosinophils 14% Allergy profile done 07/17/2014 showed very high total IgE 3283  01/20/15-  38 yoM former smoker followed for severe asthma, chronic sinusitis, hyper-eosinophilia and hyper IgE- too high for Xolair, hx SVT/ cardioversion Allergy vaccine 1:50GH    IgE too high for Xolair, Eos not high enough for Nucala FOLLOWS FOR: Pt continues to have SOB. Last ER 8/30 ER twice at end of August- prednisone 7 days, abx twice. Still feels tight. We had started maintenance prednisone and he was good, but let himself run out-> flare. CT chest 01/05/15 IMPRESSION: Left upper lobe peribronchial thickening suggesting bronchitis.  Consolidation of the right middle lobe with air bronchograms. Favor atelectasis but superimposed pneumonia is not excluded. No endobronchial mass is identified. Electronically Signed  By: Sherian Rein M.D.  On: 01/05/2015 08:13  05/22/2015-40 year old male former smoker followed for severe asthma/Nucala, chronic sinusitis, hypereosinophilia and hyper IgE-too high for Xolair, history SVT/cardioversion Allergy vaccine 1:50GH    IgE too high for Xolair, Nucala begun FOLLOWS FOR: pt. states breathing has improved. dry cough. denies wheezing, SOB or chest pain/tightness. Feels well at today's visit. Last prednisone ended over a week ago. CXR 05/05/15-no acute process  06/25/2015-40 year old male former smoker followed for severe asthma/Nucala, chronic sinusitis, hypereosinophilia and hyper IgE-too high for Xolair, history SVT/cardioversion Allergy vaccine 1:50GH  FOLLOWS FOR: Pt continues allergy vaccine and no reactions; currently holding off on  Nucala due to finances and wife sick this worked out this week. Has had only a single shot. Feels well.  09/22/2015-41 year old male former smoker followed for severe asthma/Nucala, chronic sinusitis, hypereosinophilia and hyper IgE-too high for Xolair, history SVT/cardioversion Nucala- quit- can't afford Allergy vaccine 1:50 GH- quit- can't afford FOLLOWS FOR: Pt has not had allergy injections or Nucala injections in several months. Recently took Prednisone taper. Pt states his breathing is doing okay. Pt is unable to afford Symbicort inhaler. . Depressed since his wife died of renal failure. Had only one Nucala injection before he had to stop-unable to judge effectiveness. Recent prednisone taper. Feels well today.  ROS-see HPI   Negative unless "+" Constitutional:    weight loss, night sweats, fevers, chills,+ fatigue, lassitude. HEENT:    headaches, difficulty swallowing, tooth/dental problems, sore throat,       sneezing, itching, ear ache, +nasal congestion, post nasal drip, snoring CV:    chest pain, orthopnea, PND, swelling in lower extremities, anasarca,                                                        dizziness, palpitations Resp:   +shortness of breath with exertion or at rest.                productive cough,  +non-productive cough, coughing up of blood.              change in color of mucus.  wheezing.   Skin:    rash or lesions. GI:  No-   heartburn, indigestion, abdominal pain, nausea, vomiting,  GU: d. MS:   joint pain, stiffness, decreased range of motion, back pain. Neuro-     nothing unusual Psych:  change in mood or affect.  depression or anxiety.   memory loss.  Objective:   Physical Exam General- Alert, Oriented, Affect-uncomfortable, near tears, Distress-none acute;  Tall, thin Skin- clear Lymphadenopathy- none Head- atraumatic            Eyes- Gross vision intact, PERRLA, conjunctivae clear secretions            Ears- Hearing, canals normal             Nose- Clear, no-Septal dev, mucus, polyps, erosion, perforation             Throat- Mallampati II , mucosa clear , drainage- none, tonsils- atrophic Neck- flexible , trachea midline, no stridor , thyroid nl, carotid no bruit Chest - symmetrical excursion , unlabored  Heart/CV- RRR rapid , no murmur , no gallop  , no rub, nl s1 s2                           - JVD- none , edema- none, stasis changes- none, varices- none           Lung-   diminished, unlabored,  Cough-none , dullness-none, rub- none,  Wheeze-none Abd- scaphoid  Br/ Gen/ Rectal- Not done, not indicated Extrem- cyanosis- none, clubbing, none, atrophy- none, strength- nl Neuro- +slight tremor

## 2015-09-22 NOTE — Patient Instructions (Signed)
We will try to get a tier exception for Symbicort so you may get it at tier 1 price, if we can  Sample x 2 Symbicort 160    2 puffs then rinse mouth, twice daily  Please call as needed

## 2015-09-23 NOTE — Assessment & Plan Note (Signed)
Sounds clear at this visit. Still needing episodic prednisone tapers. Unfortunately he could not afford Nucala. Financial assistance was explored. We again discussed risk benefit considerations of systemic steroids. Plan-we will try to get a tier exemption for Symbicort to get a tier 1 coverage. He can't afford tier 2. We will try to use nebulized budesonide if we can't get Symbicort.

## 2015-09-25 ENCOUNTER — Encounter: Payer: Self-pay | Admitting: Internal Medicine

## 2015-09-28 ENCOUNTER — Other Ambulatory Visit: Payer: Self-pay | Admitting: Internal Medicine

## 2015-10-23 ENCOUNTER — Ambulatory Visit: Payer: BLUE CROSS/BLUE SHIELD | Admitting: Internal Medicine

## 2015-10-29 ENCOUNTER — Other Ambulatory Visit: Payer: Self-pay | Admitting: Internal Medicine

## 2015-11-04 ENCOUNTER — Other Ambulatory Visit: Payer: Self-pay | Admitting: Internal Medicine

## 2015-11-27 ENCOUNTER — Telehealth: Payer: Self-pay | Admitting: Internal Medicine

## 2015-11-27 MED ORDER — MONTELUKAST SODIUM 10 MG PO TABS: 10.0000 mg | ORAL_TABLET | Freq: Every day | ORAL | Status: DC

## 2015-11-27 NOTE — Telephone Encounter (Signed)
Refill approved for year supply by CY and electronically sent. Nothing more needed at this time.

## 2015-12-18 ENCOUNTER — Telehealth: Payer: Self-pay | Admitting: Internal Medicine

## 2015-12-18 NOTE — Telephone Encounter (Signed)
Spoke with pt and he is currently admitted at [facility] for an asthma exacerbation. Pt states that he is to be d/c'd today. He wants to schedule f/u with CY. Advised pt that CY's schedule is booked through the end of the year, but we can schedule him with NP. Pt was fine with this. Appt made for 12/30/15 with TP. Nothing further needed.

## 2015-12-28 ENCOUNTER — Encounter (HOSPITAL_COMMUNITY): Payer: Self-pay | Admitting: Emergency Medicine

## 2015-12-28 ENCOUNTER — Emergency Department (HOSPITAL_COMMUNITY)
Admission: EM | Admit: 2015-12-28 | Discharge: 2015-12-28 | Disposition: A | Discharge status: Home / Self Care | Payer: BLUE CROSS/BLUE SHIELD | Attending: Emergency Medicine | Admitting: Emergency Medicine

## 2015-12-28 DIAGNOSIS — Z79899 Other long term (current) drug therapy: Secondary | ICD-10-CM | POA: Insufficient documentation

## 2015-12-28 DIAGNOSIS — R0602 Shortness of breath: Secondary | ICD-10-CM | POA: Diagnosis present

## 2015-12-28 DIAGNOSIS — Z87891 Personal history of nicotine dependence: Secondary | ICD-10-CM | POA: Insufficient documentation

## 2015-12-28 DIAGNOSIS — J45901 Unspecified asthma with (acute) exacerbation: Secondary | ICD-10-CM | POA: Insufficient documentation

## 2015-12-28 MED ORDER — METHYLPREDNISOLONE SODIUM SUCC 125 MG IJ SOLR
125.0000 mg | Freq: Once | INTRAMUSCULAR | Status: AC
  Administered 2015-12-28: 125 mg via INTRAMUSCULAR
  Filled 2015-12-28: qty 2

## 2015-12-28 MED ORDER — PREDNISONE 10 MG PO TABS: ORAL_TABLET | ORAL | 0 refills | Status: DC

## 2015-12-28 MED ORDER — ALBUTEROL SULFATE (2.5 MG/3ML) 0.083% IN NEBU
5.0000 mg | INHALATION_SOLUTION | Freq: Once | RESPIRATORY_TRACT | Status: AC
  Administered 2015-12-28: 5 mg via RESPIRATORY_TRACT
  Filled 2015-12-28: qty 6

## 2015-12-28 NOTE — ED Triage Notes (Signed)
Pt reports asthma flare up since last night, pt has wheezing upon auscultation, no acute distress at this time. Pt has even respirations.  Pt has used inhaler without relief. Denies cp.

## 2015-12-28 NOTE — Discharge Instructions (Signed)
Please use albuterol every 4 hours as needed for wheezing and difficulty breathing. Please use prednisone taper as prescribed. See your asthma physician or return to the emergency department if any changes, problems, or concerns.

## 2015-12-28 NOTE — ED Provider Notes (Signed)
[facility]-EMERGENCY DEPT Provider Note  By signing my name below, I, Mesha Guinyard, attest that this documentation has been prepared under the direction and in the presence of Treatment Team:  Attending Provider: Bethann BerkshireJoseph Zammit, MD Physician Assistant: Ivery QualeHobson Judeen Geralds, PA-C.  Electronically Signed: Arvilla MarketMesha Guinyard, Medical Scribe. 12/28/15. 3:33 PM.  CSN: [id number] Arrival date & time: 12/28/15  1511  History   Chief Complaint Chief Complaint  Patient presents with  . Asthma    HPI Comments: Drew Sandoval is a 40 y.o. male with a PMHx of asthma who presents to the Emergency Department complaining of SOB onset last night when he was woken out of his sleep last night with trouble breathing. He states that he is having associated symptom of dry cough. He states that he has tried steroids with relief of his symptoms, but once he stops the steroid course, his asthma quickly comes back. Pt was [place] in [place] last week for asthma. He denies chills, fevers, and past injury to his lungs.  The history is provided by the patient. No language interpreter was used.  Asthma  This is a chronic problem. The current episode started yesterday. Associated symptoms include shortness of breath.   Past Medical History:  Diagnosis Date  . Asthma   . Seasonal allergies     Patient Active Problem List   Diagnosis Date Noted  . Hypoxia 10/28/2014  . Acute conjunctivitis of right eye 10/07/2014  . Sustained SVT (HCC) 09/24/2014  . SVT (supraventricular tachycardia) (HCC) 09/24/2014  . Malnutrition of moderate degree (HCC) 09/24/2014  . Underweight 09/24/2014  . Seasonal and perennial allergic rhinitis 07/17/2014  . Acute respiratory failure with hypoxia (HCC) 07/23/2013  . Acute respiratory failure (HCC) 07/23/2013  . Asthma exacerbation 06/27/2013  . 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 Eczema, allergic 07/22/2011  . Status asthmaticus 10/19/2010  . Sinusitis, maxillary, 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    . 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 Asthma, severe persistent 07/16/2010    Past Surgical History:  Procedure Laterality Date  . None      Home Medications    Prior to Admission medications   Medication Sig Start Date End Date Taking? Authorizing Provider  albuterol (PROVENTIL HFA;VENTOLIN HFA) 108 (90 BASE) MCG/ACT inhaler Inhale 2 puffs into the lungs every 4 (four) hours as needed. 05/05/15   Devoria AlbeIva Knapp, MD  albuterol (PROVENTIL) (2.5 MG/3ML) 0.083% nebulizer solution Take 3 mLs (2.5 mg total) by nebulization every 6 (six) hours as needed for wheezing or shortness of breath. 09/22/15   Waymon Budgelinton D Young, MD  budesonide-formoterol (SYMBICORT) 160-4.5 MCG/ACT inhaler 2 puffs twice daily, then rinse mouth Patient not taking: Reported on 09/22/2015 03/26/15   Waymon Budgelinton D Young, MD  EPINEPHrine 0.3 mg/0.3 mL IJ SOAJ injection Inject into thigh for severe allergic reaction Patient taking differently: Inject 0.3 mg into the muscle once. Inject into thigh for severe allergic reaction 04/29/15   Waymon Budgelinton D Young, MD  montelukast (SINGULAIR) 10 MG tablet Take 1 tablet (10 mg total) by mouth at bedtime. 11/27/15   Waymon Budgelinton D Young, MD  NON FORMULARY Reported on 09/22/2015    Historical Provider, MD    Family History Family History  Problem Relation Age of Onset  . Emphysema Father   . Lung cancer Mother     Social History Social History  Substance Use Topics  . Smoking status: Former Smoker    Packs/day: 0.50    Years: 11.50    Types: Cigarettes    Quit date: 04/09/2010  . Smokeless  tobacco: Former NeurosurgeonUser     Comment: started at age 40.  1/2 ppd.    . Alcohol use No     Allergies   Azithromycin; Alvesco [ciclesonide]; Anoro ellipta [umeclidinium-vilanterol]; Banana; and Penicillins   Review of Systems Review of Systems  Constitutional: Negative for chills and fever.  Respiratory: Positive for cough and shortness of breath.   All other systems reviewed and are negative.  Physical  Exam Updated Vital Signs BP 144/91 (BP Location: Left Arm)   Pulse 86   Temp 98.6 F (37 C) (Oral)   Resp 18   Ht 6\' 3"  (1.905 m)   Wt 150 lb (68 kg)   SpO2 95%   BMI 18.75 kg/m   Physical Exam  Constitutional: He appears well-developed and well-nourished. No distress.  HENT:  Head: Normocephalic and atraumatic.  Eyes: Conjunctivae are normal. Pupils are equal, round, and reactive to light.  Neck: Neck supple.  Cardiovascular: Normal rate, regular rhythm and normal heart sounds.  Exam reveals no friction rub.   No murmur heard. Pulmonary/Chest: Effort normal. He has wheezes (scattered, bilaterally).  Symmetrical rise and fall of the chest Speaks in complete sentences He does not use assessory muscles  Neurological: He is alert.  Skin: Skin is warm and dry.  Psychiatric: He has a normal mood and affect. His behavior is normal.  Nursing note and vitals reviewed.  ED Treatments / Results  Labs (all labs ordered are listed, but only abnormal results are displayed) Labs Reviewed - No data to display  DIAGNOSTIC STUDIES: Oxygen Saturation is 95% on room air, normal by my interpretation.    COORDINATION OF CARE: 3:26 PM Discussed treatment plan with pt at bedside and pt agreed to plan.  EKG  EKG Interpretation None       Radiology No results found.  Procedures Procedures (including critical care time)  Medications Ordered in ED Medications - No data to display   Initial Impression / Assessment and Plan / ED Course  I have reviewed the triage vital signs and the nursing notes.  Pertinent labs & imaging results that were available during my care of the patient were reviewed by me and considered in my medical decision making (see chart for details).  Clinical Course    *I have reviewed nursing notes, vital signs, and all appropriate lab and imaging results for this patient.*  Final Clinical Impressions(s) / ED Diagnoses  Vital signs within normal limits.  Pulse oximetry is 95% on room air.  Patient breathing better after nebulizer treatments.  Patient ambulated 250 feet without problem or difficulty with breathing.  The plan at this time is for the patient to continue his albuterol every 4 hours. The patient will be started on a steroid taper. He has an appointment with his lung specialist on September 4, we have encouraged him to see if he can have this moved up since he is having recurrence of asthma problems. Patient is in agreement with this discharge plan.    Final diagnoses:  None    New Prescriptions New Prescriptions   No medications on file    **I personally performed the services described in this documentation, which was scribed in my presence. The recorded information has been reviewed and is accurate.Ivery Quale*   Ebonye Reade, PA-C 12/28/15 1642    Bethann BerkshireJoseph Zammit, MD 12/29/15 [phone]

## 2015-12-30 ENCOUNTER — Inpatient Hospital Stay: Payer: BLUE CROSS/BLUE SHIELD | Admitting: Adult Health

## 2016-01-13 ENCOUNTER — Encounter: Payer: Self-pay | Admitting: Adult Health

## 2016-01-13 ENCOUNTER — Telehealth: Payer: Self-pay | Admitting: Internal Medicine

## 2016-01-13 ENCOUNTER — Ambulatory Visit (INDEPENDENT_AMBULATORY_CARE_PROVIDER_SITE_OTHER): Payer: BLUE CROSS/BLUE SHIELD | Admitting: Adult Health

## 2016-01-13 VITALS — HR 78 | Temp 98.2°F | Ht 75.0 in | Wt 154.0 lb

## 2016-01-13 DIAGNOSIS — Z23 Encounter for immunization: Secondary | ICD-10-CM

## 2016-01-13 DIAGNOSIS — J4550 Severe persistent asthma, uncomplicated: Secondary | ICD-10-CM

## 2016-01-13 LAB — NITRIC OXIDE: NITRIC OXIDE: 158

## 2016-01-13 MED ORDER — PREDNISONE 10 MG PO TABS: ORAL_TABLET | ORAL | 1 refills | Status: DC

## 2016-01-13 MED ORDER — LEVALBUTEROL HCL 0.63 MG/3ML IN NEBU
0.6300 mg | INHALATION_SOLUTION | Freq: Once | RESPIRATORY_TRACT | Status: AC
  Administered 2016-01-13: 0.63 mg via RESPIRATORY_TRACT

## 2016-01-13 NOTE — Progress Notes (Signed)
Subjective:    Patient ID: Drew Sandoval, male    DOB: 1975-12-31, 40 y.o.   MRN: [id number]  HPI 40 yo male former smoker followed for severe asthma , chronic sinusitis , hypereosinophilia , and hyper IgE  Unable to afford Nucala Previous intubation (2012)   TEST  Allergy profile- 08/11/10 IgE 1,747 with broad specific elevation Skin test: Strongly positive especially grass, tree, dust mite Eos 14%,absolute 1.0( 0.0-0.7) Office spirometry 10/09/14- Severe obstructive disease- FVC 2.63/ 52%, FEV1 0.97/ 24%, FEV1/FVC 0.37, FEF25-75% 0.36/ 8% Eosinophils 14% Allergy profile done 07/17/2014 showed very high total IgE 3283  01/13/2016 Post hospital follow up  Pt returns for a post hospital follow up . Says he was admitted in [place] last month for asthma exacerbation .  tx w/ IV steroids and discharged on prednisone . Says he felt better for a while but as soon as he stops prednisone . Wheezing and cough return . Seen in ER on 12/28/15 for asthma flare .  He was treated with prednisone again. Same thing happened again, last week cough and wheezing are coming back . Remains on Symbicort and Singulair . Denies missed dosing.  Says CXR last month was clear .  FENO today is 158.  He denies chest pain, orthopnea, edema or fever .  He stopped his allergy vaccines few months ago.       Past Medical History:  Diagnosis Date  . Asthma   . Seasonal allergies    . Current Outpatient Prescriptions on File Prior to Visit  Medication Sig Dispense Refill  . albuterol (PROVENTIL HFA;VENTOLIN HFA) 108 (90 BASE) MCG/ACT inhaler Inhale 2 puffs into the lungs every 4 (four) hours as needed. 6.7 g 0  . albuterol (PROVENTIL) (2.5 MG/3ML) 0.083% nebulizer solution Take 3 mLs (2.5 mg total) by nebulization every 6 (six) hours as needed for wheezing or shortness of breath. 360 mL 5  . budesonide-formoterol (SYMBICORT) 160-4.5 MCG/ACT inhaler 2 puffs twice daily, then rinse mouth 1 Inhaler 6  . EPINEPHrine  0.3 mg/0.3 mL IJ SOAJ injection Inject into thigh for severe allergic reaction (Patient taking differently: Inject 0.3 mg into the muscle once. Inject into thigh for severe allergic reaction) 1 Device prn  . montelukast (SINGULAIR) 10 MG tablet Take 1 tablet (10 mg total) by mouth at bedtime. 30 tablet 12  . NON FORMULARY Reported on 09/22/2015     No current facility-administered medications on file prior to visit.       Review of Systems Constitutional:   No  weight loss, night sweats,  Fevers, chills, fatigue, or  lassitude.  HEENT:   No headaches,  Difficulty swallowing,  Tooth/dental problems, or  Sore throat,                No sneezing, itching, ear ache,  +nasal congestion, post nasal drip,   CV:  No chest pain,  Orthopnea, PND, swelling in lower extremities, anasarca, dizziness, palpitations, syncope.   GI  No heartburn, indigestion, abdominal pain, nausea, vomiting, diarrhea, change in bowel habits, loss of appetite, bloody stools.   Resp:  .  No chest wall deformity  Skin: no rash or lesions.  GU: no dysuria, change in color of urine, no urgency or frequency.  No flank pain, no hematuria   MS:  No joint pain or swelling.  No decreased range of motion.  No back pain.  Psych:  No change in mood or affect. No depression or anxiety.  No memory loss.  Objective:   Physical Exam Vitals:   01/13/16 1607  Pulse: 78  Temp: 98.2 F (36.8 C)  TempSrc: Oral  SpO2: 93%  Weight: 154 lb (69.9 kg)  Height: 6\' 3"  (1.905 m)   GEN: A/Ox3; pleasant , NAD   HEENT:  [place]/AT,  EACs-clear, TMs-wnl, NOSE-clear, THROAT-clear, no lesions, no postnasal drip or exudate noted.   NECK:  Supple w/ fair ROM; no JVD; normal carotid impulses w/o bruits; no thyromegaly or nodules palpated; no lymphadenopathy.    RESP  Exp wheezing  no accessory muscle use, no dullness to percussion, no stridor . Speaking in full sentences   CARD:  RRR, no m/r/g  , no peripheral edema, pulses intact,  no cyanosis or clubbing.  GI:   Soft & nt; nml bowel sounds; no organomegaly or masses detected.   Musco: Warm bil, no deformities or joint swelling noted.   Neuro: alert, no focal deficits noted.    Skin: Warm, no lesions or rashes  Coral Soler NP-C  [facility]  01/13/2016        Assessment & Plan:

## 2016-01-13 NOTE — Assessment & Plan Note (Signed)
Recurrent exacerbations  Add zyrtec /flonase to help with triggers  Slow prolonged steroid taper to help with sx control , hope to taper to off soon as he is so young with frequent steroid use.  Pt education on steroids    Plan  Patient Instructions  Begin prednisone taper and decrease to 10 mg daily and hold at this dose until seen back in office. Continue on Symbicort 2 puffs twice daily Continue on file 10 mg daily Add Zyrtec 10 mg at bedtime Begin Flonase 2 puffs daily Follow-up with Dr. [name] in 2-3 weeks and as needed Please contact office for sooner follow up if symptoms do not improve or worsen or seek emergency care

## 2016-01-13 NOTE — Patient Instructions (Signed)
Begin prednisone taper and decrease to 10 mg daily and hold at this dose until seen back in office. Continue on Symbicort 2 puffs twice daily Continue on file 10 mg daily Add Zyrtec 10 mg at bedtime Begin Flonase 2 puffs daily Follow-up with Dr. [name] in 2-3 weeks and as needed Please contact office for sooner follow up if symptoms do not improve or worsen or seek emergency care

## 2016-01-14 NOTE — Telephone Encounter (Signed)
Pt returned call for appoint made appoint for 12/14@20 .Caren GriffinsStanley A Dalton

## 2016-01-14 NOTE — Telephone Encounter (Signed)
lmtcb X1 for pt to schedule office visit. CY has an opening on 04/22/2016 at 2:00.

## 2016-01-14 NOTE — Telephone Encounter (Signed)
Noted. Will close this encounter.  

## 2016-01-29 ENCOUNTER — Ambulatory Visit: Payer: BLUE CROSS/BLUE SHIELD | Admitting: Internal Medicine

## 2016-03-10 ENCOUNTER — Observation Stay (HOSPITAL_COMMUNITY)
Admission: EM | Admit: 2016-03-10 | Discharge: 2016-03-11 | Disposition: A | Discharge status: Home / Self Care | Payer: BLUE CROSS/BLUE SHIELD | Attending: Internal Medicine | Admitting: Internal Medicine

## 2016-03-10 ENCOUNTER — Encounter (HOSPITAL_COMMUNITY): Payer: Self-pay | Admitting: *Deleted

## 2016-03-10 ENCOUNTER — Emergency Department (HOSPITAL_COMMUNITY): Payer: BLUE CROSS/BLUE SHIELD

## 2016-03-10 DIAGNOSIS — Z79899 Other long term (current) drug therapy: Secondary | ICD-10-CM | POA: Insufficient documentation

## 2016-03-10 DIAGNOSIS — J4541 Moderate persistent asthma with (acute) exacerbation: Secondary | ICD-10-CM | POA: Diagnosis not present

## 2016-03-10 DIAGNOSIS — J45901 Unspecified asthma with (acute) exacerbation: Secondary | ICD-10-CM | POA: Diagnosis present

## 2016-03-10 DIAGNOSIS — Z87891 Personal history of nicotine dependence: Secondary | ICD-10-CM | POA: Diagnosis not present

## 2016-03-10 DIAGNOSIS — J9601 Acute respiratory failure with hypoxia: Principal | ICD-10-CM | POA: Insufficient documentation

## 2016-03-10 DIAGNOSIS — E876 Hypokalemia: Secondary | ICD-10-CM | POA: Diagnosis not present

## 2016-03-10 DIAGNOSIS — J9600 Acute respiratory failure, unspecified whether with hypoxia or hypercapnia: Secondary | ICD-10-CM | POA: Diagnosis present

## 2016-03-10 LAB — BASIC METABOLIC PANEL
ANION GAP: 7 (ref 5–15)
BUN: 15 mg/dL (ref 6–20)
CO2: 25 mmol/L (ref 22–32)
Calcium: 8.5 mg/dL — ABNORMAL LOW (ref 8.9–10.3)
Chloride: 110 mmol/L (ref 101–111)
Creatinine, Ser: 0.77 mg/dL (ref 0.61–1.24)
GFR calc Af Amer: >60 mL/min (ref >60)
Glucose, Bld: 101 mg/dL — ABNORMAL HIGH (ref 65–99)
POTASSIUM: 3.1 mmol/L — AB (ref 3.5–5.1)
SODIUM: 142 mmol/L (ref 135–145)

## 2016-03-10 LAB — CBC WITH DIFFERENTIAL/PLATELET
BASOS ABS: 0.1 10*3/uL (ref 0.0–0.1)
Basophils Relative: 1 %
Eosinophils Absolute: 0.8 10*3/uL — ABNORMAL HIGH (ref 0.0–0.7)
Eosinophils Relative: 12 %
HEMATOCRIT: 43.1 % (ref 39.0–52.0)
HEMOGLOBIN: 14.2 g/dL (ref 13.0–17.0)
LYMPHS PCT: 40 %
Lymphs Abs: 2.7 10*3/uL (ref 0.7–4.0)
MCH: 30.7 pg (ref 26.0–34.0)
MCHC: 32.9 g/dL (ref 30.0–36.0)
MCV: 93.3 fL (ref 78.0–100.0)
Monocytes Absolute: 0.6 10*3/uL (ref 0.1–1.0)
Monocytes Relative: 8 %
NEUTROS ABS: 2.7 10*3/uL (ref 1.7–7.7)
Neutrophils Relative %: 39 %
Platelets: 207 10*3/uL (ref 150–400)
RBC: 4.62 MIL/uL (ref 4.22–5.81)
RDW: 14.8 % (ref 11.5–15.5)
WBC: 6.7 10*3/uL (ref 4.0–10.5)

## 2016-03-10 MED ORDER — MONTELUKAST SODIUM 10 MG PO TABS
10.0000 mg | ORAL_TABLET | Freq: Every day | ORAL | Status: DC
  Administered 2016-03-10: 10 mg via ORAL
  Filled 2016-03-10: qty 1

## 2016-03-10 MED ORDER — ARFORMOTEROL TARTRATE 15 MCG/2ML IN NEBU
15.0000 ug | INHALATION_SOLUTION | Freq: Two times a day (BID) | RESPIRATORY_TRACT | Status: DC
  Administered 2016-03-10 – 2016-03-11 (×2): 15 ug via RESPIRATORY_TRACT
  Filled 2016-03-10: qty 2

## 2016-03-10 MED ORDER — PREDNISONE 20 MG PO TABS
60.0000 mg | ORAL_TABLET | Freq: Every day | ORAL | Status: DC
  Administered 2016-03-11: 60 mg via ORAL
  Filled 2016-03-10: qty 3

## 2016-03-10 MED ORDER — MAGNESIUM SULFATE 2 GM/50ML IV SOLN
2.0000 g | Freq: Once | INTRAVENOUS | Status: AC
  Administered 2016-03-10: 2 g via INTRAVENOUS
  Filled 2016-03-10: qty 50

## 2016-03-10 MED ORDER — PNEUMOCOCCAL VAC POLYVALENT 25 MCG/0.5ML IJ INJ
0.5000 mL | INJECTION | INTRAMUSCULAR | Status: AC
  Administered 2016-03-11: 0.5 mL via INTRAMUSCULAR
  Filled 2016-03-10: qty 0.5

## 2016-03-10 MED ORDER — ALBUTEROL (5 MG/ML) CONTINUOUS INHALATION SOLN: 10.0000 mg/h | INHALATION_SOLUTION | RESPIRATORY_TRACT | Status: DC | PRN

## 2016-03-10 MED ORDER — ONDANSETRON HCL 4 MG/2ML IJ SOLN: 4.0000 mg | Freq: Four times a day (QID) | INTRAMUSCULAR | Status: DC | PRN

## 2016-03-10 MED ORDER — ALBUTEROL SULFATE (2.5 MG/3ML) 0.083% IN NEBU: 2.5000 mg | INHALATION_SOLUTION | RESPIRATORY_TRACT | Status: DC | PRN

## 2016-03-10 MED ORDER — ALBUTEROL SULFATE (2.5 MG/3ML) 0.083% IN NEBU
2.5000 mg | INHALATION_SOLUTION | Freq: Four times a day (QID) | RESPIRATORY_TRACT | Status: DC
  Administered 2016-03-11 (×2): 2.5 mg via RESPIRATORY_TRACT
  Filled 2016-03-10 (×2): qty 3

## 2016-03-10 MED ORDER — ALBUTEROL SULFATE (2.5 MG/3ML) 0.083% IN NEBU: 5.0000 mg | INHALATION_SOLUTION | Freq: Once | RESPIRATORY_TRACT | Status: DC

## 2016-03-10 MED ORDER — METHYLPREDNISOLONE SODIUM SUCC 125 MG IJ SOLR
125.0000 mg | Freq: Once | INTRAMUSCULAR | Status: AC
  Administered 2016-03-10: 125 mg via INTRAVENOUS
  Filled 2016-03-10: qty 2

## 2016-03-10 MED ORDER — ENOXAPARIN SODIUM 40 MG/0.4ML ~~LOC~~ SOLN: 40.0000 mg | SUBCUTANEOUS | Status: DC

## 2016-03-10 MED ORDER — POTASSIUM CHLORIDE CRYS ER 20 MEQ PO TBCR
40.0000 meq | EXTENDED_RELEASE_TABLET | ORAL | Status: AC
  Administered 2016-03-10: 40 meq via ORAL
  Filled 2016-03-10: qty 2

## 2016-03-10 MED ORDER — ALBUTEROL (5 MG/ML) CONTINUOUS INHALATION SOLN
10.0000 mg/h | INHALATION_SOLUTION | RESPIRATORY_TRACT | Status: DC
  Administered 2016-03-10: 10 mg/h via RESPIRATORY_TRACT

## 2016-03-10 MED ORDER — ALBUTEROL (5 MG/ML) CONTINUOUS INHALATION SOLN
10.0000 mg/h | INHALATION_SOLUTION | Freq: Once | RESPIRATORY_TRACT | Status: AC
  Administered 2016-03-10: 10 mg/h via RESPIRATORY_TRACT
  Filled 2016-03-10: qty 20

## 2016-03-10 MED ORDER — BUDESONIDE 0.5 MG/2ML IN SUSP
0.5000 mg | Freq: Two times a day (BID) | RESPIRATORY_TRACT | Status: DC
Start: 2016-03-10 — End: 2016-03-11
  Administered 2016-03-10 – 2016-03-11 (×2): 0.5 mg via RESPIRATORY_TRACT
  Filled 2016-03-10: qty 2

## 2016-03-10 MED ORDER — INFLUENZA VAC SPLIT QUAD 0.5 ML IM SUSY
0.5000 mL | PREFILLED_SYRINGE | INTRAMUSCULAR | Status: AC
  Administered 2016-03-11: 0.5 mL via INTRAMUSCULAR
  Filled 2016-03-10: qty 0.5

## 2016-03-10 MED ORDER — ONDANSETRON HCL 4 MG PO TABS
4.0000 mg | ORAL_TABLET | Freq: Four times a day (QID) | ORAL | Status: DC | PRN
  Administered 2016-03-10: 4 mg via ORAL
  Filled 2016-03-10: qty 1

## 2016-03-10 NOTE — H&P (Signed)
History and Physical    Drew Sandoval ZOX:[id number] DOB: Jan 26, 1976 DOA: 03/10/2016  Referring Sandoval/NP/PA: Dr. Particia Sandoval PCP: Drew BudgeYOUNG,Drew Sandoval  Patient coming from: Home  Chief Complaint: Shortness of breath  HPI: Drew Sandoval is a 40 y.o. male with medical history significant of SVT, seasonal allergies, and persistent asthma; who presents with complaints of shortness of breath. Symptoms started 2 days ago(10/29) when he was working with someone with heavy cologne on at work. Associated symptoms included chest tightness, productive cough clear sputum, and wheezing. He notified his supervisor of the issue with the other workers cologne, and he no longer had to work with this person.  Tried using home inhalers and nebulizer treatments with mild temporary relief. He is not normally on oxygen at home. Denies any nausea, vomiting, fever, chest pain, abdominal pain, diarrhea, loss of consciousness, or recent sick contacts. He notes that his asthma can be aggravated by the season changes. Patient notes that the last time that he was admitted into the hospital for asthma exacerbation was [place] ago [place]  ED Course: On admission were department patient was seen to be afebrile, respirations up to 26, blood pressure maintained, saturations as low as 88% on room air. The patient was started on nasal cannula oxygen with improvement of O2 sats greater than 92%. Initial lab work revealed potassium 3.1, and all other vitals relatively within normal limits. Patient was given 125 mg of Solu-Medrol, 2 g of magnesium sulfate, and 2 hour long neb treatments with continued wheezing. ED physician recommended stepdown for possible need of continuous overnight.  Review of Systems: As per HPI otherwise 10 point review of systems negative.   Past Medical History:  Diagnosis Date  . Asthma   . Seasonal allergies     Past Surgical History:  Procedure Laterality Date  . None       reports that he quit smoking about 5 years ago. His smoking use included Cigarettes. He has a 5.75 pack-year smoking history. He has quit using smokeless tobacco. He reports that he does not drink alcohol or use drugs.  Allergies  Allergen Reactions  . Azithromycin Rash  . Alvesco [Ciclesonide]     Possible reaction- rash  . Anoro Ellipta [Umeclidinium-Vilanterol]     Possible- rash  . Banana Swelling    Throat swelling   . Penicillins Rash    Has patient had a PCN reaction causing immediate rash, facial/tongue/throat swelling, SOB or lightheadedness with hypotension: Yes Has patient had a PCN reaction causing severe rash involving mucus membranes or skin necrosis: No Has patient had a PCN reaction that required hospitalization Yes Has patient had a PCN reaction occurring within the last 10 years: No If all of the above answers are "NO", then may proceed with Cephalosporin use.      Family History  Problem Relation Age of Onset  . Emphysema Father   . Lung cancer Mother     Prior to Admission medications   Medication Sig Start Date End Date Taking? Authorizing Provider  albuterol (PROVENTIL HFA;VENTOLIN HFA) 108 (90 BASE) MCG/ACT inhaler Inhale 2 puffs into the lungs every 4 (four) hours as needed. 05/05/15  Yes Drew AlbeIva Knapp, Sandoval  albuterol (PROVENTIL) (2.5 MG/3ML) 0.083% nebulizer solution Take 3 mLs (2.5 mg total) by nebulization every 6 (six) hours as needed for wheezing or shortness of breath. 09/22/15  Yes Drew Budgelinton D Young, Sandoval  budesonide-formoterol [facility](SYMBICORT) 160-4.5 MCG/ACT inhaler 2 puffs twice daily, then rinse mouth 03/26/15  Yes  Drew Sandoval  montelukast (SINGULAIR) 10 MG tablet Take 1 tablet (10 mg total) by mouth at bedtime. 11/27/15  Yes Drew Sandoval  EPINEPHrine 0.3 mg/0.3 mL IJ SOAJ injection Inject into thigh for severe allergic reaction Patient taking differently: Inject 0.3 mg into the muscle once. Inject into thigh for severe allergic reaction 04/29/15    Drew Sandoval    Physical Exam:   Constitutional: Middle-aged male who appears to be in some all respiratory distress  Vitals:   03/10/16 1917 03/10/16 2000 03/10/16 2100 03/10/16 2105  BP:  123/63 149/77   Pulse:  74 72 74  Resp:  13 13 18   Temp:      TempSrc:      SpO2: 96% 96% 93% 96%  Weight:      Height:       Eyes: PERRL, lids and conjunctivae normal ENMT: Mucous membranes are moist. Posterior pharynx clear of any exudate or lesions.Normal dentition.  Neck: normal, supple, no masses, no thyromegaly Respiratory:  Decreased overall air movement with expiratory wheeze appreciated. Patient on nasal cannula oxygen with O2 saturations 94%. Patient talking in short sentences to 3 word sentences, but respiratory rate regular at this time. Cardiovascular: Regular rate and rhythm, no murmurs / rubs / gallops. No extremity edema. 2+ pedal pulses. No carotid bruits.  Abdomen: no tenderness, no masses palpated. No hepatosplenomegaly. Bowel sounds positive.  Musculoskeletal: no clubbing / cyanosis. No joint deformity upper and lower extremities. Good ROM, no contractures. Normal muscle tone.  Skin: no rashes, lesions, ulcers. No induration Neurologic: CN 2-12 grossly intact. Sensation intact, DTR normal. Strength 5/5 in all 4.  Psychiatric: Normal judgment and insight. Alert and oriented x 3. Normal mood.     Labs on Admission: I have personally reviewed following labs and imaging studies  CBC:  Recent Labs Lab 03/10/16 1800  WBC 6.7  NEUTROABS 2.7  HGB 14.2  HCT 43.1  MCV 93.3  PLT 207   Basic Metabolic Panel:  Recent Labs Lab 03/10/16 1800  NA 142  K 3.1*  CL 110  CO2 25  GLUCOSE 101*  BUN 15  CREATININE 0.77  CALCIUM 8.5*   GFR: Estimated Creatinine Clearance: 118.1 mL/min (by C-G formula based on SCr of 0.77 mg/dL). Liver Function Tests: No results for input(s): AST, ALT, ALKPHOS, BILITOT, PROT, ALBUMIN in the last 168 hours. No results for input(s):  LIPASE, AMYLASE in the last 168 hours. No results for input(s): AMMONIA in the last 168 hours. Coagulation Profile: No results for input(s): INR, PROTIME in the last 168 hours. Cardiac Enzymes: No results for input(s): CKTOTAL, CKMB, CKMBINDEX, TROPONINI in the last 168 hours. BNP (last 3 results) No results for input(s): PROBNP in the last 8760 hours. HbA1C: No results for input(s): HGBA1C in the last 72 hours. CBG: No results for input(s): GLUCAP in the last 168 hours. Lipid Profile: No results for input(s): CHOL, HDL, LDLCALC, TRIG, CHOLHDL, LDLDIRECT in the last 72 hours. Thyroid Function Tests: No results for input(s): TSH, T4TOTAL, FREET4, T3FREE, THYROIDAB in the last 72 hours. Anemia Panel: No results for input(s): VITAMINB12, FOLATE, FERRITIN, TIBC, IRON, RETICCTPCT in the last 72 hours. Urine analysis:    Component Value Date/Time   COLORURINE YELLOW 06/24/2011 0858   APPEARANCEUR CLEAR 06/24/2011 0858   LABSPEC 1.010 06/24/2011 0858   PHURINE 7.0 06/24/2011 0858   GLUCOSEU NEGATIVE 06/24/2011 0858   HGBUR SMALL (A) 06/24/2011 0858   BILIRUBINUR NEGATIVE 06/24/2011 6962  KETONESUR NEGATIVE 06/24/2011 0858   PROTEINUR NEGATIVE 06/24/2011 0858   UROBILINOGEN 0.2 06/24/2011 0858   NITRITE NEGATIVE 06/24/2011 0858   LEUKOCYTESUR NEGATIVE 06/24/2011 0858   Sepsis Labs: No results found for this or any previous visit (from the past 240 hour(s)).   Radiological Exams on Admission: Dg Chest 2 View  Result Date: 03/10/2016 CLINICAL DATA:  Cough and congestion . EXAM: CHEST  2 VIEW COMPARISON:  05/05/2015 . FINDINGS: Mediastinum and hilar structures are normal. Lungs are clear. No pleural effusion or pneumothorax. Heart size normal. No acute bony abnormality . IMPRESSION: No acute cardiopulmonary disease. Electronically Signed   By: Maisie Fushomas  Register   On: 03/10/2016 15:50     Assessment/Plan Asthma exacerbation/Respiratory failure with hypoxia: Acute. Patient reports  wheezing cough and shortness of breath worsened with recent exposure to cologne at work. Chest x-ray negative. Patient O2 saturations on room air as low as 88% requiring placement to nasal cannula oxygen. Given Solu-Medrol, magnesium sulfate, and continuous DuoNeb treatments was continued wheezing and shortness of breath. Suspect acute asthma exacerbation. - Admit to stepdown bed for possible need ofcontinuous nebs - Continuous pulse oximetry with nasal cannula oxygen to keep O2 sats greater than 92%  - Albuterol neb every 6 hours/ prn q2 hrs/continuous nebulized treatment if needed - Prednisone 60 mg daily, and will need taper - Check peak flow - Check EKG and troponin 1   Hypokalemia: Initial potassium 3.1 on admission. - Potassium chloride 40 mEq 1 dose now  - Continue to monitor and replace as needed   DVT prophylaxis: Lovenox Code Status:  full  Family Communication:  No family present at bedside  Disposition Plan: Likely discharge home once medically stable   Consults called:  none  Admission status: Observation stepdown   Clydie Braunondell A Contrina Orona Sandoval [facility] Pager [phone]- [phone]  If 7PM-7AM, please contact night-coverage [email or web address] Password [email or web address]  03/10/2016, 9:12 PM

## 2016-03-10 NOTE — ED Provider Notes (Signed)
[facility]-EMERGENCY DEPT Provider Note   CSN: [id number] Arrival date & time: 03/10/16  1524     History   Chief Complaint Chief Complaint  Patient presents with  . Shortness of Breath    HPI Drew Sandoval is a 40 y.o. male.  Pt has a hx of asthma.  He was working with someone on Sunday (10/29) who had on a lot of cologne.  Since then, he has had sob.  The pt has albuterol at home which he's been using, but it has not been helping.  He said that he's not too bad when he is sitting still, but is very sob with movement.      Past Medical History:  Diagnosis Date  . Asthma   . Seasonal allergies     Patient Active Problem List   Diagnosis Date Noted  . Hypoxia 10/28/2014  . Acute conjunctivitis of right eye 10/07/2014  . Sustained SVT (HCC) 09/24/2014  . SVT (supraventricular tachycardia) (HCC) 09/24/2014  . Malnutrition of moderate degree (HCC) 09/24/2014  . Underweight 09/24/2014  . Seasonal and perennial allergic rhinitis 07/17/2014  . Acute respiratory failure with hypoxia (HCC) 07/23/2013  . Acute respiratory failure (HCC) 07/23/2013  . Asthma exacerbation 06/27/2013  . 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 Eczema, allergic 07/22/2011  . Status asthmaticus 10/19/2010  . Sinusitis, maxillary, 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  . 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 Asthma, severe persistent 07/16/2010    Past Surgical History:  Procedure Laterality Date  . None         Home Medications    Prior to Admission medications   Medication Sig Start Date End Date Taking? Authorizing Provider  albuterol (PROVENTIL HFA;VENTOLIN HFA) 108 (90 BASE) MCG/ACT inhaler Inhale 2 puffs into the lungs every 4 (four) hours as needed. 05/05/15  Yes Devoria AlbeIva Knapp, MD  albuterol (PROVENTIL) (2.5 MG/3ML) 0.083% nebulizer solution Take 3 mLs (2.5 mg total) by nebulization every 6 (six) hours as needed for wheezing or shortness of breath. 09/22/15  Yes Waymon Budgelinton D Young, MD  budesonide-formoterol  [facility](SYMBICORT) 160-4.5 MCG/ACT inhaler 2 puffs twice daily, then rinse mouth 03/26/15  Yes Waymon Budgelinton D Young, MD  montelukast (SINGULAIR) 10 MG tablet Take 1 tablet (10 mg total) by mouth at bedtime. 11/27/15  Yes Waymon Budgelinton D Young, MD  EPINEPHrine 0.3 mg/0.3 mL IJ SOAJ injection Inject into thigh for severe allergic reaction Patient taking differently: Inject 0.3 mg into the muscle once. Inject into thigh for severe allergic reaction 04/29/15   Waymon Budgelinton D Young, MD    Family History Family History  Problem Relation Age of Onset  . Emphysema Father   . Lung cancer Mother     Social History Social History  Substance Use Topics  . Smoking status: Former Smoker    Packs/day: 0.50    Years: 11.50    Types: Cigarettes    Quit date: 04/09/2010  . Smokeless tobacco: Former NeurosurgeonUser     Comment: started at age 40.  1/2 ppd.    . Alcohol use No     Allergies   Azithromycin; Alvesco [ciclesonide]; Anoro ellipta [umeclidinium-vilanterol]; Banana; and Penicillins   Review of Systems Review of Systems  Respiratory: Positive for cough, shortness of breath and wheezing.   All other systems reviewed and are negative.    Physical Exam Updated Vital Signs BP 123/63   Pulse 74   Temp 97.6 F (36.4 C) (Oral)   Resp 13   Ht 6\' 3"  (1.905 m)   Wt 150 lb (68 kg)  SpO2 96%   BMI 18.75 kg/m   Physical Exam  Constitutional: He is oriented to person, place, and time. He appears well-developed and well-nourished.  HENT:  Head: Normocephalic and atraumatic.  Right Ear: External ear normal.  Left Ear: External ear normal.  Nose: Nose normal.  Mouth/Throat: Oropharynx is clear and moist.  Eyes: Conjunctivae and EOM are normal. Pupils are equal, round, and reactive to light.  Neck: Normal range of motion. Neck supple.  Cardiovascular: Normal rate, regular rhythm, normal heart sounds and intact distal pulses.   Pulmonary/Chest: Tachypnea noted. He has wheezes.  Abdominal: Soft. Bowel sounds are  normal.  Musculoskeletal: Normal range of motion.  Neurological: He is alert and oriented to person, place, and time.  Skin: Skin is warm.  Psychiatric: He has a normal mood and affect. His behavior is normal. Judgment and thought content normal.  Nursing note and vitals reviewed.    ED Treatments / Results  Labs (all labs ordered are listed, but only abnormal results are displayed) Labs Reviewed  BASIC METABOLIC PANEL  CBC WITH DIFFERENTIAL/PLATELET    EKG  EKG Interpretation  Date/Time:  Wednesday March 10 2016 15:39:05 EDT Ventricular Rate:  82 PR Interval:  154 QRS Duration: 80 QT Interval:  344 QTC Calculation: 401 R Axis:   88 Text Interpretation:  Normal sinus rhythm with sinus arrhythmia Right atrial enlargement Borderline ECG Confirmed by Particia NearingHAVILAND MD, Lachlyn Vanderstelt ([id number]) on 03/10/2016 4:25:30 PM       Radiology Dg Chest 2 View  Result Date: 03/10/2016 CLINICAL DATA:  Cough and congestion . EXAM: CHEST  2 VIEW COMPARISON:  05/05/2015 . FINDINGS: Mediastinum and hilar structures are normal. Lungs are clear. No pleural effusion or pneumothorax. Heart size normal. No acute bony abnormality . IMPRESSION: No acute cardiopulmonary disease. Electronically Signed   By: Maisie Fushomas  Register   On: 03/10/2016 15:50    Procedures Procedures (including critical care time)  Medications Ordered in ED Medications  albuterol (PROVENTIL,VENTOLIN) solution continuous neb (10 mg/hr Nebulization New Bag/Given 03/10/16 1915)  magnesium sulfate IVPB 2 g 50 mL (2 g Intravenous New Bag/Given 03/10/16 2006)  albuterol (PROVENTIL,VENTOLIN) solution continuous neb (10 mg/hr Nebulization Given 03/10/16 1639)  methylPREDNISolone sodium succinate (SOLU-MEDROL) 125 mg/2 mL injection 125 mg (125 mg Intravenous Given 03/10/16 1642)     Initial Impression / Assessment and Plan / ED Course  I have reviewed the triage vital signs and the nursing notes.  Pertinent labs & imaging results that were  available during my care of the patient were reviewed by me and considered in my medical decision making (see chart for details).  Clinical Course   Pt received 125 mg of solumedrol and 10 mg albuterol and he was still very wheezy.  His O2 sat would not come up above 88% on RA.  I put him on 2L and gave him an additional 10 mg albuterol.  Sx have improved, but he is still very sob with mvmt.  Pt d/w Dr. Katrinka BlazingSmith ([facility]) for admission.  Final Clinical Impressions(s) / ED Diagnoses   Final diagnoses:  Moderate persistent asthma with exacerbation    New Prescriptions New Prescriptions   No medications on file     Jacalyn LefevreJulie Azuree Minish, MD 03/10/16 2024

## 2016-03-10 NOTE — ED Triage Notes (Signed)
Pt reports he is having an "asthma attack". Pt reports SOB upon wakening this morning which has worsened since 1330 today. Pt also reports clear productive cough that has been going on for years, no change in cough today. Pt denies fever.  Pt reports he has used his rescue inhalers at home with no relief.

## 2016-03-11 DIAGNOSIS — J9601 Acute respiratory failure with hypoxia: Secondary | ICD-10-CM | POA: Diagnosis not present

## 2016-03-11 DIAGNOSIS — E876 Hypokalemia: Secondary | ICD-10-CM

## 2016-03-11 DIAGNOSIS — J4551 Severe persistent asthma with (acute) exacerbation: Secondary | ICD-10-CM | POA: Diagnosis not present

## 2016-03-11 LAB — CBC
HCT: 41.4 % (ref 39.0–52.0)
HEMOGLOBIN: 13.6 g/dL (ref 13.0–17.0)
MCH: 30.4 pg (ref 26.0–34.0)
MCHC: 32.9 g/dL (ref 30.0–36.0)
MCV: 92.4 fL (ref 78.0–100.0)
Platelets: 209 10*3/uL (ref 150–400)
RBC: 4.48 MIL/uL (ref 4.22–5.81)
RDW: 14.8 % (ref 11.5–15.5)
WBC: 8.5 10*3/uL (ref 4.0–10.5)

## 2016-03-11 LAB — BASIC METABOLIC PANEL
ANION GAP: 6 (ref 5–15)
BUN: 15 mg/dL (ref 6–20)
CALCIUM: 8.4 mg/dL — AB (ref 8.9–10.3)
CO2: 24 mmol/L (ref 22–32)
Chloride: 111 mmol/L (ref 101–111)
Creatinine, Ser: 0.6 mg/dL — ABNORMAL LOW (ref 0.61–1.24)
GLUCOSE: 127 mg/dL — AB (ref 65–99)
POTASSIUM: 4.5 mmol/L (ref 3.5–5.1)
SODIUM: 141 mmol/L (ref 135–145)

## 2016-03-11 LAB — MRSA PCR SCREENING: MRSA by PCR: NEGATIVE

## 2016-03-11 LAB — TROPONIN I

## 2016-03-11 MED ORDER — ALBUTEROL SULFATE HFA 108 (90 BASE) MCG/ACT IN AERS: 2.0000 | INHALATION_SPRAY | RESPIRATORY_TRACT | 0 refills | Status: DC | PRN

## 2016-03-11 MED ORDER — BUDESONIDE-FORMOTEROL FUMARATE 160-4.5 MCG/ACT IN AERO: INHALATION_SPRAY | RESPIRATORY_TRACT | 6 refills | Status: DC

## 2016-03-11 MED ORDER — PREDNISONE 10 MG PO TABS: 10.0000 mg | ORAL_TABLET | Freq: Every day | ORAL | 0 refills | Status: DC

## 2016-03-11 NOTE — Discharge Summary (Signed)
Physician Discharge Summary  Drew PavlovMarshall Sporn WUX:[id number] DOB: 1975-06-27 DOA: 03/10/2016  PCP: Waymon BudgeYOUNG,CLINTON D, MD  Admit date: 03/10/2016 Discharge date: 03/11/2016  Time spent: 45 minutes  Recommendations for Outpatient Follow-up:  -Will be discharged home today. -Advised to follow up with Dr. [name] in 2 weeks.   Discharge Diagnoses:  Active Problems:   Asthma exacerbation   Acute respiratory failure (HCC)   Hypokalemia   Discharge Condition: Stable and improved  Filed Weights   03/10/16 1532 03/10/16 2255 03/11/16 0500  Weight: 68 kg (150 lb) 70 kg (154 lb 5.2 oz) 71.1 kg (156 lb 12 oz)    History of present illness:  As per Dr. Katrinka BlazingSmith on 11/1: Drew Sandoval is a 40 y.o. male with medical history significant of SVT, seasonal allergies, and persistent asthma; who presents with complaints of shortness of breath. Symptoms started 2 days ago(10/29) when he was working with someone with heavy cologne on at work. Associated symptoms included chest tightness, productive cough clear sputum, and wheezing. He notified his supervisor of the issue with the other workers cologne, and he no longer had to work with this person.  Tried using home inhalers and nebulizer treatments with mild temporary relief. He is not normally on oxygen at home. Denies any nausea, vomiting, fever, chest pain, abdominal pain, diarrhea, loss of consciousness, or recent sick contacts. He notes that his asthma can be aggravated by the season changes. Patient notes that the last time that he was admitted into the hospital for asthma exacerbation was [place] ago [place].  ED Course: On admission were department patient was seen to be afebrile, respirations up to 26, blood pressure maintained, saturations as low as 88% on room air. The patient was started on nasal cannula oxygen with improvement of O2 sats greater than 92%. Initial lab work revealed potassium 3.1, and all other vitals  relatively within normal limits. Patient was given 125 mg of Solu-Medrol, 2 g of magnesium sulfate, and 2 hour long neb treatments with continued wheezing. ED physician recommended stepdown for possible need of continuous overnight.  Hospital Course:   Asthma Exacerbation -Much improved today. -Patient states he is closed to his baseline; has ambulated without oxygen and without significant symptoms. -Have provided refills for his symbicort and albuterol. He is to continue singulair. -Has been given a prednisone prescription as well.  Hypokalemia -Replaced.  Procedures:  None   Consultations:  None  Discharge Instructions  Discharge Instructions    Increase activity slowly    Complete by:  As directed        Medication List    TAKE these medications   albuterol (2.5 MG/3ML) 0.083% nebulizer solution Commonly known as:  PROVENTIL Take 3 mLs (2.5 mg total) by nebulization every 6 (six) hours as needed for wheezing or shortness of breath.   albuterol 108 (90 Base) MCG/ACT inhaler Commonly known as:  PROVENTIL HFA;VENTOLIN HFA Inhale 2 puffs into the lungs every 4 (four) hours as needed.   budesonide-formoterol 160-4.5 MCG/ACT inhaler Commonly known as:  SYMBICORT 2 puffs twice daily, then rinse mouth   EPINEPHrine 0.3 mg/0.3 mL Soaj injection Commonly known as:  EPI-PEN Inject into thigh for severe allergic reaction What changed:  how much to take  how to take this  when to take this  additional instructions   montelukast 10 MG tablet Commonly known as:  SINGULAIR Take 1 tablet (10 mg total) by mouth at bedtime.   predniSONE 10 MG tablet Commonly  known as:  DELTASONE Take 1 tablet (10 mg total) by mouth daily with breakfast. Take 6 tablets today and then decrease by 1 tablet daily until none are left.      Allergies  Allergen Reactions  . Azithromycin Rash  . Alvesco [Ciclesonide]     Possible reaction- rash  . Anoro Ellipta  [Umeclidinium-Vilanterol]     Possible- rash  . Banana Swelling    Throat swelling   . Penicillins Rash    Has patient had a PCN reaction causing immediate rash, facial/tongue/throat swelling, SOB or lightheadedness with hypotension: Yes Has patient had a PCN reaction causing severe rash involving mucus membranes or skin necrosis: No Has patient had a PCN reaction that required hospitalization Yes Has patient had a PCN reaction occurring within the last 10 years: No If all of the above answers are "NO", then may proceed with Cephalosporin use.     Follow-up Information    Waymon BudgeYOUNG,CLINTON D, MD. Schedule an appointment as soon as possible for a visit in 2 week(s).   Specialty:  Pulmonary Disease Contact information: [address] [place] [address] [phone]            The results of significant diagnostics from this hospitalization (including imaging, microbiology, ancillary and laboratory) are listed below for reference.    Significant Diagnostic Studies: Dg Chest 2 View  Result Date: 03/10/2016 CLINICAL DATA:  Cough and congestion . EXAM: CHEST  2 VIEW COMPARISON:  05/05/2015 . FINDINGS: Mediastinum and hilar structures are normal. Lungs are clear. No pleural effusion or pneumothorax. Heart size normal. No acute bony abnormality . IMPRESSION: No acute cardiopulmonary disease. Electronically Signed   By: Maisie Fushomas  Register   On: 03/10/2016 15:50    Microbiology: Recent Results (from the past 240 hour(s))  MRSA PCR Screening     Status: None   Collection Time: 03/10/16 10:47 PM  Result Value Ref Range Status   MRSA by PCR NEGATIVE NEGATIVE Final    Comment:        The GeneXpert MRSA Assay (FDA approved for NASAL specimens only), is one component of a comprehensive MRSA colonization surveillance program. It is not intended to diagnose MRSA infection nor to guide or monitor treatment for MRSA infections.      Labs: Basic Metabolic Panel:  Recent Labs Lab  03/10/16 1800 03/11/16 0408  NA 142 141  K 3.1* 4.5  CL 110 111  CO2 25 24  GLUCOSE 101* 127*  BUN 15 15  CREATININE 0.77 0.60*  CALCIUM 8.5* 8.4*   Liver Function Tests: No results for input(s): AST, ALT, ALKPHOS, BILITOT, PROT, ALBUMIN in the last 168 hours. No results for input(s): LIPASE, AMYLASE in the last 168 hours. No results for input(s): AMMONIA in the last 168 hours. CBC:  Recent Labs Lab 03/10/16 1800 03/11/16 0408  WBC 6.7 8.5  NEUTROABS 2.7  --   HGB 14.2 13.6  HCT 43.1 41.4  MCV 93.3 92.4  PLT 207 209   Cardiac Enzymes:  Recent Labs Lab 03/10/16 1800  TROPONINI <0.03   BNP: BNP (last 3 results) No results for input(s): BNP in the last 8760 hours.  ProBNP (last 3 results) No results for input(s): PROBNP in the last 8760 hours.  CBG: No results for input(s): GLUCAP in the last 168 hours.     SignedChaya Jan:  HERNANDEZ ACOSTA,ESTELA  [facility] Pager: [phone] 03/11/2016, 9:50 AM

## 2016-03-11 NOTE — Progress Notes (Signed)
1039 d/c instructions, paperwork and hard Rxs given to patient. Patient discussed with this Clinical research associatewriter different triggers for asthma attacks and ways to act quickly (NEB Txs, inhaler) when he has an attack. IV catheter removed from LEFT hand, intact, w/no s/s of infection/infiltration noted, patient tolerated well. Monitor wires removed from patient. Patient assisted to vehicle ambulatory.

## 2016-03-12 ENCOUNTER — Telehealth: Payer: Self-pay | Admitting: Internal Medicine

## 2016-03-12 NOTE — Telephone Encounter (Signed)
CY please advise if we can write a work excuse for this pt since he was in hospital.  Pt is currently in the lobby.  thanks

## 2016-03-12 NOTE — Telephone Encounter (Signed)
Letter has been done by Shanda BumpsJessica and signed by CY.  appt scheduled for pt to see CY on 11/9 and this appt was ok by CY.  Letter and appt card given to the pt and nothing further is needed.  Pt wanted to return to work on 11/4 and CY adjusted the work note.

## 2016-03-17 ENCOUNTER — Telehealth: Payer: Self-pay | Admitting: Internal Medicine

## 2016-03-17 NOTE — Telephone Encounter (Signed)
Placed on CY's cart to complete.

## 2016-03-18 ENCOUNTER — Ambulatory Visit (INDEPENDENT_AMBULATORY_CARE_PROVIDER_SITE_OTHER): Payer: BLUE CROSS/BLUE SHIELD | Admitting: Internal Medicine

## 2016-03-18 ENCOUNTER — Other Ambulatory Visit (INDEPENDENT_AMBULATORY_CARE_PROVIDER_SITE_OTHER): Payer: BLUE CROSS/BLUE SHIELD

## 2016-03-18 ENCOUNTER — Encounter: Payer: Self-pay | Admitting: Internal Medicine

## 2016-03-18 VITALS — BP 122/90 | HR 80 | Ht 75.0 in | Wt 163.6 lb

## 2016-03-18 DIAGNOSIS — J302 Other seasonal allergic rhinitis: Secondary | ICD-10-CM

## 2016-03-18 DIAGNOSIS — J3089 Other allergic rhinitis: Secondary | ICD-10-CM

## 2016-03-18 DIAGNOSIS — J4550 Severe persistent asthma, uncomplicated: Secondary | ICD-10-CM | POA: Diagnosis not present

## 2016-03-18 LAB — CBC WITH DIFFERENTIAL/PLATELET
BASOS PCT: 0.9 % (ref 0.0–3.0)
Basophils Absolute: 0.1 10*3/uL (ref 0.0–0.1)
EOS ABS: 0.6 10*3/uL (ref 0.0–0.7)
Eosinophils Relative: 8.1 % — ABNORMAL HIGH (ref 0.0–5.0)
HCT: 43.6 % (ref 39.0–52.0)
HEMOGLOBIN: 14.3 g/dL (ref 13.0–17.0)
Lymphocytes Relative: 46.1 % — ABNORMAL HIGH (ref 12.0–46.0)
Lymphs Abs: 3.5 10*3/uL (ref 0.7–4.0)
MCHC: 32.9 g/dL (ref 30.0–36.0)
MCV: 91.3 fl (ref 78.0–100.0)
MONO ABS: 0.6 10*3/uL (ref 0.1–1.0)
Monocytes Relative: 8.2 % (ref 3.0–12.0)
NEUTROS PCT: 36.7 % — AB (ref 43.0–77.0)
Neutro Abs: 2.8 10*3/uL (ref 1.4–7.7)
Platelets: 198 10*3/uL (ref 150.0–400.0)
RBC: 4.77 Mil/uL (ref 4.22–5.81)
RDW: 15.3 % (ref 11.5–15.5)
WBC: 7.7 10*3/uL (ref 4.0–10.5)

## 2016-03-18 LAB — NITRIC OXIDE: NITRIC OXIDE: 100

## 2016-03-18 MED ORDER — PREDNISONE 10 MG PO TABS: ORAL_TABLET | ORAL | 4 refills | Status: DC

## 2016-03-18 NOTE — Assessment & Plan Note (Addendum)
He denies sinus symptoms at this time after recent steroid therapy for asthma exacerbation. No polyps. 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

## 2016-03-18 NOTE — Assessment & Plan Note (Addendum)
Currently denying sinus disease, symptoms of peripheral neuropathy, and lax pulmonary infiltrates, rash, sinus symptoms. Elevated nitric oxide and eosinophils support impression that a biosimilar like Cinqair should be tried. If this can't be provided or is not effective, consider further evaluation for Churg-Strauss. I don't see an obvious biopsy target peripherally for angiitis and would not put him through a lung biopsy. Plan-evaluate candidacy for Cinqair, continue maintenance prednisone 10 mg daily. Patient education today emphasizing importance of staying ahead of his medication refills and not waiting until he has run out to call us.

## 2016-03-18 NOTE — Patient Instructions (Signed)
Order- FENO    Dx asthma severe persistent uncomplicated              Office spirometry  Order- lab- IgE, CBC w diff, ANCA  We will explore whether you could be a candidate for Cinqair intravenous therapy   Continue prednisone 10 mg daily- script sent  Please call ahead if you are getting low on medicines

## 2016-03-18 NOTE — Telephone Encounter (Signed)
Rec'd FMLA paperwork back from [employer]  - Fwd to [employer] via interoffice mail - 03/18/16-pr

## 2016-03-18 NOTE — Progress Notes (Signed)
Patient ID: Drew Sandoval, male    DOB: 03/31/1976    MRN: [id number]  HPI M  former smoker followed for severe asthma, chronic sinusitis, hyper-eosinophilia and hyper IgE   09/22/2015-40 year old male former smoker followed for severe asthma/Nucala, chronic sinusitis, hypereosinophilia and hyper IgE-too high for Xolair, history SVT/cardioversion Nucala- quit- can't afford Allergy vaccine 1:50 GH- quit- can't afford FOLLOWS FOR: Pt has not had allergy injections or Nucala injections in several months. Recently took Prednisone taper. Pt states his breathing is doing okay. Pt is unable to afford Symbicort inhaler. . Depressed since his wife died of renal failure. Had only one Nucala injection before he had to stop-unable to judge effectiveness. Recent prednisone taper. Feels well today.  03/18/2016-40 year old male former smoker followed for severe asthma, chronic sinusitis, persistent eosinophilia, hyper IgE-too high for Xolair, history SVT/cardioversion Post Hosptial-pt states asthma flare was triggered by cologne smell at work-ended up going to ED and being admited. Pt feels he is back to his baseline. [facility] asthma exacerbations.  IgE was too high for Xolair. He could not afford Nucala or allergy vaccine. I explored possibility he might fit a Churg-Strauss eosinophilic granulomatous angiitis profile. He is now back to his maintenance prednisone 10 mg daily. He denies sinus problems, neuropathic burning/numbness discomfort in hands or feet, skin rash. Chest x-ray had not shown obvious parenchymal densities. He has not had heart or kidney disturbance. No history of nasal polyps. Feels stable currently on Singulair, Symbicort with occasional use of rescue albuterol. Had flu vax at hosp CXR 03/10/2016-NAD FENO 03/18/16- 100  High, indicates allergic asthma Office Spirometry 03/18/2016-severe obstructive airways disease FVC 2.62/51%, FEV1 1.30/31%,  FEV1/FVC 0.50, FEF 25-75 0.61/14%. FMLA form completed today for intermittent work absence due to illness.  ROS-see HPI   Negative unless "+" Constitutional:    weight loss, night sweats, fevers, chills,+ fatigue, lassitude. HEENT:    headaches, difficulty swallowing, tooth/dental problems, sore throat,       sneezing, itching, ear ache, nasal congestion, post nasal drip, snoring CV:    chest pain, orthopnea, PND, swelling in lower extremities, anasarca,                                                        dizziness, palpitations Resp:   +shortness of breath with exertion or at rest.                productive cough,  non-productive cough, coughing up of blood.              change in color of mucus.  wheezing.   Skin:    rash or lesions. GI:  No-   heartburn, indigestion, abdominal pain, nausea, vomiting,  GU: d. MS:   joint pain, stiffness, decreased range of motion, back pain. Neuro-     nothing unusual Psych:  change in mood or affect.  depression or anxiety.   memory loss.  Objective:   Physical Exam General- Alert, Oriented, Affect-uncomfortable, near tears, Distress-none acute;  Tall, thin Skin- clear Lymphadenopathy- none Head- atraumatic            Eyes- Gross vision intact, PERRLA, conjunctivae clear secretions            Ears- Hearing, canals normal  Nose- Clear, no-Septal dev, mucus, polyps, erosion, perforation             Throat- Mallampati II , mucosa clear , drainage- none, tonsils- atrophic Neck- flexible , trachea midline, no stridor , thyroid nl, carotid no bruit Chest - symmetrical excursion , unlabored           Heart/CV- RRR rapid , no murmur , no gallop  , no rub, nl s1 s2                           - JVD- none , edema- none, stasis changes- none, varices- none           Lung-   diminished, unlabored,  Cough-none , dullness-none, rub- none,  Wheeze + I&E Abd- scaphoid  Br/ Gen/ Rectal- Not done, not indicated Extrem- cyanosis- none, clubbing, none,  atrophy- none, strength- nl Neuro- +slight tremor

## 2016-03-18 NOTE — Telephone Encounter (Signed)
done

## 2016-03-19 LAB — IGE: IgE (Immunoglobulin E), Serum: 2151 kU/L — ABNORMAL HIGH (ref <115)

## 2016-03-19 LAB — ANCA SCREEN W REFLEX TITER: ANCA Screen: NEGATIVE

## 2016-04-22 ENCOUNTER — Ambulatory Visit: Payer: BLUE CROSS/BLUE SHIELD | Admitting: Internal Medicine

## 2016-05-20 ENCOUNTER — Encounter: Payer: Self-pay | Admitting: Internal Medicine

## 2016-05-20 ENCOUNTER — Ambulatory Visit (INDEPENDENT_AMBULATORY_CARE_PROVIDER_SITE_OTHER): Payer: BLUE CROSS/BLUE SHIELD | Admitting: Internal Medicine

## 2016-05-20 ENCOUNTER — Encounter: Payer: Self-pay | Admitting: *Deleted

## 2016-05-20 VITALS — BP 118/70 | HR 66 | Ht 75.0 in | Wt 162.4 lb

## 2016-05-20 DIAGNOSIS — J4551 Severe persistent asthma with (acute) exacerbation: Secondary | ICD-10-CM

## 2016-05-20 DIAGNOSIS — J45901 Unspecified asthma with (acute) exacerbation: Secondary | ICD-10-CM | POA: Diagnosis not present

## 2016-05-20 DIAGNOSIS — D824 Hyperimmunoglobulin E [IgE] syndrome: Secondary | ICD-10-CM | POA: Diagnosis not present

## 2016-05-20 DIAGNOSIS — J4550 Severe persistent asthma, uncomplicated: Secondary | ICD-10-CM | POA: Diagnosis not present

## 2016-05-20 MED ORDER — PREDNISONE 10 MG PO TABS: 10.0000 mg | ORAL_TABLET | Freq: Every day | ORAL | 0 refills | Status: DC

## 2016-05-20 MED ORDER — METHYLPREDNISOLONE ACETATE 80 MG/ML IJ SUSP
80.0000 mg | Freq: Once | INTRAMUSCULAR | Status: AC
  Administered 2016-05-20: 80 mg via INTRAMUSCULAR

## 2016-05-20 MED ORDER — LEVALBUTEROL HCL 0.63 MG/3ML IN NEBU
0.6300 mg | INHALATION_SOLUTION | Freq: Once | RESPIRATORY_TRACT | Status: AC
  Administered 2016-05-20: 0.63 mg via RESPIRATORY_TRACT

## 2016-05-20 NOTE — Patient Instructions (Signed)
Neb xop 0.63   - done  Depo 80     Dx exacerbation of asthma  We will help follow-up with the Cinqair biologic infusion asthma therapy application.  Script sent for prednisone burst. Then resume maintenance 10 mg daily.

## 2016-05-20 NOTE — Progress Notes (Signed)
Patient ID: Drew Sandoval, male    DOB: Jul 02, 1975    MRN: [id number]  HPI M  former smoker followed for severe asthma, chronic sinusitis, hyper-eosinophilia and hyper IgE FENO 03/18/16- 100  High, indicates allergic asthma Office Spirometry 03/18/2016-severe obstructive airways disease FVC 2.62/51%, FEV1 1.30/31%, FEV1/FVC 0.50, FEF 25-75 0.61/14%. IgE was too high for Xolair. He could not afford Nucala or allergy vaccine  ----------------------------------------------------   03/18/2016-41 year old male former smoker followed for severe asthma, chronic sinusitis, persistent eosinophilia, hyper IgE-too high for Xolair, history SVT/cardioversion Post Hosptial-pt states asthma flare was triggered by cologne smell at work-ended up going to ED and being admited. Pt feels he is back to his baseline. Hospitalized at [facility] and then at [facility] for asthma exacerbations.  IgE was too high for Xolair. He could not afford Nucala or allergy vaccine. I explored possibility he might fit a Churg-Strauss eosinophilic granulomatous angiitis profile. He is now back to his maintenance prednisone 10 mg daily. He denies sinus problems, neuropathic burning/numbness discomfort in hands or feet, skin rash. Chest x-ray had not shown obvious parenchymal densities. He has not had heart or kidney disturbance. No history of nasal polyps. Feels stable currently on Singulair, Symbicort with occasional use of rescue albuterol. Had flu vax at hosp CXR 03/10/2016-NAD FENO 03/18/16- 100  High, indicates allergic asthma Office Spirometry 03/18/2016-severe obstructive airways disease FVC 2.62/51%, FEV1 1.30/31%, FEV1/FVC 0.50, FEF 25-75 0.61/14%. FMLA form completed today for intermittent work absence due to illness.  05/20/2016-41 year old male former smoker followed for severe asthma, chronic sinusitis, persistent eosinophilia, hyper IgE-too high for Xolair, history SVT/cardioversion FOLLOWS FOR: Currently having chest  tightness,wheezing, and SOB. Pt had recent asthma attack and had to increase his Prednisone Rx-now will run out prior to next fill.  Latest hospital in our system was 03-2016 but he has been admitted to [facility]. Cinqair application is in progress. He stopped allergy shots and Nucala in past because of finances. IgE too high for Xolair. Head cold with nasal congestion and drainage increased wheezing. Denies fever or green sputum. Prednisone 10 mg daily maintenance which she increased to days ago with onset of this illness.  ROS-see HPI  "+" = pos Constitutional:    weight loss, night sweats, fevers, chills, fatigue, lassitude. HEENT:    headaches, difficulty swallowing, tooth/dental problems, sore throat,       sneezing, itching, ear ache, +nasal congestion, +post nasal drip, snoring CV:    chest pain, orthopnea, PND, swelling in lower extremities, anasarca,                                                        dizziness, palpitations Resp:   +shortness of breath with exertion or at rest.                productive cough,  non-productive cough, coughing up of blood.              change in color of mucus.  +wheezing.   Skin:    rash or lesions. GI:  No-   heartburn, indigestion, abdominal pain, nausea, vomiting,  GU: d. MS:   joint pain, stiffness, decreased range of motion, back pain. Neuro-     nothing unusual Psych:  change in mood or affect.  depression or anxiety.   memory loss.  Objective:  Physical Exam General- Alert, Oriented, Affect-reserved/ laconic, Distress-none acute;  Tall, thin Skin- clear Lymphadenopathy- none Head- atraumatic            Eyes- Gross vision intact, PERRLA, conjunctivae clear secretions            Ears- Hearing, canals normal            Nose- Clear, no-Septal dev, mucus, polyps, erosion, perforation             Throat- Mallampati II , mucosa clear , drainage- none, tonsils- atrophic Neck- flexible , trachea midline, no stridor , thyroid nl, carotid no  bruit Chest - symmetrical excursion , unlabored           Heart/CV- RRR rapid , no murmur , no gallop  , no rub, nl s1 s2                           - JVD- none , edema- none, stasis changes- none, varices- none           Lung-   diminished, unlabored,  Cough-none , dullness-none, rub- none,  Wheeze-none Abd- scaphoid  Br/ Gen/ Rectal- Not done, not indicated Extrem- cyanosis- none, clubbing, none, atrophy- none, strength- nl Neuro- +slight tremor

## 2016-05-21 ENCOUNTER — Telehealth: Payer: Self-pay | Admitting: Internal Medicine

## 2016-05-21 NOTE — Telephone Encounter (Signed)
Ok to give work note

## 2016-05-21 NOTE — Telephone Encounter (Signed)
Spoke with pt, states he had to call out of work today d/t asthma. Pt seen yesterday by CY.  Requesting to pick up a work note from office.   CY ok to write work note?  Thanks.   Last ov: 05/20/16 Next ov:  09/10/16  Allergies  Allergen Reactions  . Azithromycin Rash  . Alvesco [Ciclesonide]     Possible reaction- rash  . Anoro Ellipta [Umeclidinium-Vilanterol]     Possible- rash  . Banana Swelling    Throat swelling   . Penicillins Rash    Has patient had a PCN reaction causing immediate rash, facial/tongue/throat swelling, SOB or lightheadedness with hypotension: Yes Has patient had a PCN reaction causing severe rash involving mucus membranes or skin necrosis: No Has patient had a PCN reaction that required hospitalization Yes Has patient had a PCN reaction occurring within the last 10 years: No If all of the above answers are "NO", then may proceed with Cephalosporin use.     Current Outpatient Prescriptions on File Prior to Visit  Medication Sig Dispense Refill  . albuterol (PROVENTIL HFA;VENTOLIN HFA) 108 (90 Base) MCG/ACT inhaler Inhale 2 puffs into the lungs every 4 (four) hours as needed. 6.7 g 0  . albuterol (PROVENTIL) (2.5 MG/3ML) 0.083% nebulizer solution Take 3 mLs (2.5 mg total) by nebulization every 6 (six) hours as needed for wheezing or shortness of breath. 360 mL 5  . budesonide-formoterol (SYMBICORT) 160-4.5 MCG/ACT inhaler 2 puffs twice daily, then rinse mouth 1 Inhaler 6  . EPINEPHrine 0.3 mg/0.3 mL IJ SOAJ injection Inject into thigh for severe allergic reaction (Patient taking differently: Inject 0.3 mg into the muscle once. Inject into thigh for severe allergic reaction) 1 Device prn  . montelukast (SINGULAIR) 10 MG tablet Take 1 tablet (10 mg total) by mouth at bedtime. 30 tablet 12  . predniSONE (DELTASONE) 10 MG tablet 1 daily 30 tablet 4  . predniSONE (DELTASONE) 10 MG tablet Take 1 tablet (10 mg total) by mouth daily with breakfast. Take 6 tablets today and  then decrease by 1 tablet daily until none are left. 21 tablet 0   No current facility-administered medications on file prior to visit.

## 2016-05-21 NOTE — Telephone Encounter (Signed)
Note has been given to pt. Nothing further needed.

## 2016-06-16 NOTE — Assessment & Plan Note (Signed)
He doesn't look toxic or sound tight now after he increased his prednisone to 30 mg daily 2 days ago. We again discussed steroid therapy. Plan-follow standard prednisone taper back to 10 mg daily maintenance. Fluids. No antibiotic yet

## 2016-06-16 NOTE — Assessment & Plan Note (Signed)
Finish Education officer, museumCinqair application as discussed. Hopefully assistance will be available and he can [facility]. Looking for reduced exacerbations and steroid dependence.

## 2016-06-16 NOTE — Assessment & Plan Note (Signed)
Hopefully can qualify for trial of Cinqair

## 2016-06-21 ENCOUNTER — Telehealth: Payer: Self-pay | Admitting: Internal Medicine

## 2016-06-21 NOTE — Telephone Encounter (Signed)
Tammy can you give an update on this?

## 2016-06-22 NOTE — Telephone Encounter (Signed)
[employer] faxed summary of benefits. This states to fax clinical information to the pt's plan at [phone]. The clinical information needed is: OV note, labs, med list, the prescription form, dx code, and letter from provider stating medical necessity. Will hold in triage to follow up on.   Dr. [name] please advise on letter for pt's Cinqair. Thanks.

## 2016-07-01 NOTE — Telephone Encounter (Signed)
CY - please advise. Thanks! 

## 2016-07-08 NOTE — Telephone Encounter (Signed)
Drew HesselbachMaria is calling to following up on the Auth for Cinqair

## 2016-07-08 NOTE — Telephone Encounter (Signed)
Drew Sandoval / Dr [name] please advise on the status of the Cinqair letter. Thanks.

## 2016-07-13 NOTE — Telephone Encounter (Signed)
Spoke with Florentina AddisonKatie, no letter is needed.   Will send to her to document.

## 2016-08-02 ENCOUNTER — Telehealth: Payer: Self-pay | Admitting: Internal Medicine

## 2016-08-02 NOTE — Telephone Encounter (Signed)
Spoke with from [employer] and they will fax to the triage fax machine a pre determination form to be filled out for Cinqair.  Will await fax

## 2016-08-03 NOTE — Telephone Encounter (Signed)
Spoke with Aundra MilletMegan at [employer], she is refaxing forms to triage fax as these have not yet been received.  Will await fax.

## 2016-08-05 NOTE — Telephone Encounter (Signed)
Drew Sandoval, please advise if you have received anything. Thanks.

## 2016-08-09 NOTE — Telephone Encounter (Signed)
Spoke with Florentina Addison, she did receive the fax from [employer].

## 2016-08-11 NOTE — Telephone Encounter (Signed)
Will send to [facility] to document once completed.

## 2016-08-11 NOTE — Telephone Encounter (Signed)
Dimas Millin and I are working on this together.

## 2016-08-18 ENCOUNTER — Telehealth: Payer: Self-pay | Admitting: Internal Medicine

## 2016-08-18 MED ORDER — ALBUTEROL SULFATE HFA 108 (90 BASE) MCG/ACT IN AERS: 2.0000 | INHALATION_SPRAY | RESPIRATORY_TRACT | 5 refills | Status: DC | PRN

## 2016-08-18 NOTE — Addendum Note (Signed)
Addended by: Reynaldo Minium C on: 08/18/2016 12:59 PM   Modules accepted: Orders

## 2016-08-18 NOTE — Telephone Encounter (Signed)
Spoke with patient-aware of Rx sent to pharmacy. Nothing more needed at this time.

## 2016-08-23 NOTE — Telephone Encounter (Signed)
I have taken care of forms and keeping updated.

## 2016-08-31 ENCOUNTER — Other Ambulatory Visit: Payer: Self-pay | Admitting: Internal Medicine

## 2016-09-08 ENCOUNTER — Telehealth: Payer: Self-pay | Admitting: Internal Medicine

## 2016-09-08 NOTE — Telephone Encounter (Signed)
Spoke with French Ana with [employer] [facility]  She is calling to check the status of PA  Will forward to TS and KW to check on this, thanks

## 2016-09-14 NOTE — Telephone Encounter (Signed)
TS/KW please advise, thanks!

## 2016-09-20 ENCOUNTER — Ambulatory Visit: Payer: BLUE CROSS/BLUE SHIELD | Admitting: Internal Medicine

## 2016-09-21 NOTE — Telephone Encounter (Signed)
Please let patient know that PA had to be re-done and I am waiting a decision-should have answer in next 48-72 hours. Thanks.

## 2016-09-21 NOTE — Telephone Encounter (Signed)
Please advise on status of PA thanks

## 2016-09-21 NOTE — Telephone Encounter (Signed)
Pt is aware of below message and voiced his understanding.  

## 2016-09-23 ENCOUNTER — Ambulatory Visit (INDEPENDENT_AMBULATORY_CARE_PROVIDER_SITE_OTHER): Payer: BLUE CROSS/BLUE SHIELD | Admitting: Internal Medicine

## 2016-09-23 ENCOUNTER — Encounter: Payer: Self-pay | Admitting: Internal Medicine

## 2016-09-23 DIAGNOSIS — J3089 Other allergic rhinitis: Secondary | ICD-10-CM

## 2016-09-23 DIAGNOSIS — J4550 Severe persistent asthma, uncomplicated: Secondary | ICD-10-CM | POA: Diagnosis not present

## 2016-09-23 DIAGNOSIS — J302 Other seasonal allergic rhinitis: Secondary | ICD-10-CM

## 2016-09-23 NOTE — Patient Instructions (Signed)
Ok to continue present meds  My nurse will work with you to check on the status of our application for Cinqair therapy  For allergic nose and eyes- try otc antihistamine like Claritin/ loratadine or Allegra/ fexofenadine                                                                                                                   Nasal steroid spray like Flonase/ fluticasone (1-2 puffs each nostril once daily                                                                at bedtime while needed)

## 2016-09-23 NOTE — Progress Notes (Signed)
Patient ID: Drew Sandoval, male    DOB: [birth year]    MRN: [id number]  HPI M  former smoker followed for severe asthma, chronic sinusitis, hyper-eosinophilia and hyper IgE, complicated by history SVT/cardioversion FENO 03/18/16- 100  High, indicates allergic asthma Office Spirometry 03/18/2016-severe obstructive airways disease FVC 2.62/51%, FEV1 1.30/31%, FEV1/FVC 0.50, FEF 25-75 0.61/14%. IgE was too high for Xolair. He could not afford Nucala or allergy vaccine  ----------------------------------------------------   05/20/2016-41 year old male former smoker followed for severe asthma, chronic sinusitis, persistent eosinophilia, hyper IgE-too high for Xolair, history SVT/cardioversion FOLLOWS FOR: Currently having chest tightness,wheezing, and SOB. Pt had recent asthma attack and had to increase his Prednisone Rx-now will run out prior to next fill.  Latest hospital in our system was 03-2016 but [facility] Cinqair application is in progress. He stopped allergy shots and Nucala in past because of finances. IgE too high for Xolair. Head cold with nasal congestion and drainage increased wheezing. Denies fever or green sputum. Prednisone 10 mg daily maintenance which she increased to days ago with onset of this illness.  09/23/16- 41 year old male former smoker followed for severe asthma, chronic sinusitis, persistent eosinophilia, hyper IgE-too high for Xolair, history SVT/cardioversion FOLLOW UP FOR patient last seen for asthma exacerbation he states that he is better.  He tells me he is "good now".When asked how recently he had a bad day-4 days ago he needed to take 2 extra prednisone tablets and a nebulizer treatment. Otherwise remains on baseline 10 mg prednisone daily. This has substantially reduced his number of emergency room visits. Continues Symbicort, Singulair. Daily use of rescue inhaler. Still waiting for action on his Cinqair biologic application-he thinks  Florentina AddisonKatie is working on this but I will need to verify that. He asks recommendations for symptoms of allergic rhinitis-discussed antihistamines and Flonase.  ROS-see HPI  "+" = pos Constitutional:    weight loss, night sweats, fevers, chills, fatigue, lassitude. HEENT:    headaches, difficulty swallowing, tooth/dental problems, sore throat,       sneezing, itching, ear ache, +nasal congestion, +post nasal drip, snoring CV:    chest pain, orthopnea, PND, swelling in lower extremities, anasarca,                                                        dizziness, palpitations Resp:   +shortness of breath with exertion or at rest.                productive cough,  non-productive cough, coughing up of blood.              change in color of mucus.  +wheezing.   Skin:    rash or lesions. GI:  No-   heartburn, indigestion, abdominal pain, nausea, vomiting,  GU: d. MS:   joint pain, stiffness, decreased range of motion, back pain. Neuro-     nothing unusual Psych:  change in mood or affect.  depression or anxiety.   memory loss.  Objective:   Physical Exam General- Alert, Oriented, Affect-reserved/ laconic, Distress-none acute;  Tall, thin Skin- clear Lymphadenopathy- none Head- atraumatic            Eyes- Gross vision intact, PERRLA, conjunctivae clear secretions            Ears- Hearing, canals normal  Nose- Clear, no-Septal dev, mucus, polyps, erosion, perforation             Throat- Mallampati II , mucosa clear , drainage- none, tonsils- atrophic Neck- flexible , trachea midline, no stridor , thyroid nl, carotid no bruit Chest - symmetrical excursion , unlabored           Heart/CV- RRR rapid , no murmur , no gallop  , no rub, nl s1 s2                           - JVD- none , edema- none, stasis changes- none, varices- none           Lung-   diminished, unlabored,  Cough-none , dullness-none, rub- none,  Wheeze-none Abd- scaphoid  Br/ Gen/ Rectal- Not done, not indicated Extrem-  cyanosis- none, clubbing, none, atrophy- none, strength- nl Neuro- +slight tremor

## 2016-09-25 NOTE — Assessment & Plan Note (Signed)
Steroid dependent. Using meds appropriately. Last ED visit in our system was in November. Hopefully we can get patient support financial help to try Cinqair. He would be an appropriate candidate but finances have been a barrier.

## 2016-09-25 NOTE — Assessment & Plan Note (Signed)
He describes increased itching and sneezing consistent with spring pollen rhinitis. Plan-I recommended OTC Claritin and Flonase

## 2016-10-20 ENCOUNTER — Telehealth: Payer: Self-pay | Admitting: Internal Medicine

## 2016-10-20 NOTE — Telephone Encounter (Signed)
Spoke with Drew Sandoval at [employer], states that she still has not received Cinqair PA for pt.   [facility] but did not see PA forms for pt.  No PA forms in PA folder.    KW please advise if this has been initiated.  Thanks.

## 2016-10-21 NOTE — Telephone Encounter (Signed)
I will contact company today as we have been working on this for months and continue to get different outcomes. Thanks.

## 2016-10-22 NOTE — Telephone Encounter (Signed)
Drew Sandoval- any update?

## 2016-10-22 NOTE — Telephone Encounter (Signed)
Unable to reach anyone at the office today. Will try again Monday 10-25-16.

## 2016-10-26 NOTE — Telephone Encounter (Signed)
Have you received any papers Katie?

## 2016-10-27 NOTE — Telephone Encounter (Signed)
I have spoken with Drew Sandoval, who states she has not received any forms as of yet. Per Drew Sandoval she will f/u with [employer] today.

## 2016-11-02 NOTE — Telephone Encounter (Signed)
Spoke with [employer] rep-they are aware that we continue to get back and forth information on Pa approval or denial on patient. They are helping to get this matter resolved as well. Will keep updated as I receive them. Thanks.

## 2016-11-02 NOTE — Telephone Encounter (Signed)
Drew AddisonKatie can you please give an update on this? Is there anything triage can do to help?

## 2016-11-04 NOTE — Telephone Encounter (Signed)
Spoke with patients Insurance [employer] [place]-states pt does not need PA for Cinqair and unsure why we continued to get kick back for PA-then no answers. Pt is covered for Cinqair. He will be covered at 70% pymt by insurance and 30% pymt by patient and out of pocket deductible met. Once full out of pocket (pt responsibility) meet then patient will be covered at 100%.   LMTCB-pt will ask for Katie.  

## 2016-11-09 NOTE — Telephone Encounter (Signed)
LMTCB-will ask for [name].  

## 2016-11-11 NOTE — Telephone Encounter (Signed)
Patient request for Katie to call him before 3pm today.

## 2016-11-11 NOTE — Telephone Encounter (Signed)
Will call patient tomorrow before 3 pm-as it is after 3 pm today and was with provider.

## 2016-11-15 NOTE — Telephone Encounter (Signed)
Spoke with pt and informed him of Katie's message. Pt was concerned because this was the first time of him hearing of this. Patient had further question about exactly what his out of pocket deductible was and informed the patient I was unsure this was not provided him previous message. Informed patient that he can reach out to his insurance and get further information.   Katie did you need to discuss anything further with his patient

## 2016-11-17 NOTE — Telephone Encounter (Signed)
Returned call for Traci at [employer] Spoke with Traci, states that a PA is not required but does recommend a Pre-determination for coverage since it does cost a lot.  All the below needs to be faxed to [employer] - (Fax#) [phone] Fax cover sheet stating Pre-Determination for Cinquair - patient name, DOB, insurance number and Re: request for Pre-Determination for Cinquair.   Clinicals needs to be included in the fax documents -- name and strength of medication, NDC # if available, dates of service/OV notes, ICD-10 codes (code and description), prescriber information and site of care and any supporting labs or tests or statement from Dr [name].   No time frame to complete this request but still needs to be done quickly.   [facility] to advise and follow up.

## 2016-11-17 NOTE — Telephone Encounter (Signed)
Traci with [employer] is following up on request. Per Traci a pre determination is required. [phone].

## 2016-11-18 NOTE — Telephone Encounter (Signed)
ATC to call [employer] but recorded message stated office was currently closed

## 2016-11-18 NOTE — Telephone Encounter (Signed)
[employer] should contact pt with cost,etc and I have sent pre determination information to his insurance company.

## 2016-12-01 ENCOUNTER — Telehealth: Payer: Self-pay | Admitting: Internal Medicine

## 2016-12-01 NOTE — Telephone Encounter (Signed)
Spoke with Katie - she is having diFlorentina Addisonfficulty reaching pt and may not be able to reach back out to [employer] until later this week. Called and spoke to Che[place]Maria, [place]. Informed her of the recs per Katie. Byrd HesselbachMaria verbalized understanding and denied any further questions or concerns at this time. (see phone note from 6.13.2018)

## 2016-12-02 NOTE — Telephone Encounter (Signed)
Katie any updates on this matter? Thanks.

## 2016-12-03 ENCOUNTER — Other Ambulatory Visit: Payer: Self-pay | Admitting: Internal Medicine

## 2016-12-07 NOTE — Telephone Encounter (Signed)
Katie any updates on this matter? Thanks. 

## 2016-12-07 NOTE — Telephone Encounter (Signed)
No updates from Insurance about pre determination. Will seek updates again today.

## 2016-12-14 NOTE — Telephone Encounter (Signed)
Any updates on this?

## 2016-12-16 ENCOUNTER — Telehealth: Payer: Self-pay | Admitting: Internal Medicine

## 2016-12-16 NOTE — Telephone Encounter (Signed)
Duplicate phone message- see 10/20/16 phone note.

## 2016-12-16 NOTE — Telephone Encounter (Signed)
Drew Sandoval with [facility](Callback #: [phone])  calling to check status of PA for pt's Cinqair.  I advised Drew Sandoval that per this message we did not have any updates for the past month.    KW please advise.  Thank you.

## 2016-12-18 ENCOUNTER — Other Ambulatory Visit: Payer: Self-pay | Admitting: Internal Medicine

## 2016-12-24 NOTE — Telephone Encounter (Signed)
Katie please advise. Thanks. 

## 2016-12-29 ENCOUNTER — Telehealth: Payer: Self-pay | Admitting: Pulmonary Disease

## 2016-12-29 NOTE — Telephone Encounter (Signed)
Called [employer] back to see what was needed. Was advised that the case manager named Clydie Braun had called. The rep attempted to get me transferred over to the case manager, but she was on the phone. The rep stated she would send Clydie Braun a message to call back concerning this patient.

## 2016-12-31 NOTE — Telephone Encounter (Signed)
Drew Addison do you still have this paperwork? [employer] is stating that they never received it so it needs to be refaxed. Thanks.

## 2016-12-31 NOTE — Telephone Encounter (Signed)
Called and spoke to [place] with [employer], who requested for update on PA. I have made Traci aware that we submitted pre determination on 11/18/16. Traci transferred me to clinical support and call was disconnected.  Florentina Addison do you have an update?

## 2016-12-31 NOTE — Telephone Encounter (Signed)
Morrie Sheldon with [employer] stated insurance did not receive pre determination. Please fax to medical review department [phone].

## 2017-01-11 NOTE — Telephone Encounter (Signed)
Katie please advise thanks 

## 2017-01-14 ENCOUNTER — Telehealth: Payer: Self-pay | Admitting: Internal Medicine

## 2017-01-14 NOTE — Telephone Encounter (Signed)
Spoke with Drew Sandoval. She was checking on the status of the pre-determination for Cinquair. Abbott Paodvised [name] that per the last message on this patient, we have nothing yet. She verbalized understanding. Nothing else needed at time of call.

## 2017-01-25 ENCOUNTER — Ambulatory Visit (INDEPENDENT_AMBULATORY_CARE_PROVIDER_SITE_OTHER): Payer: BLUE CROSS/BLUE SHIELD | Admitting: Internal Medicine

## 2017-01-25 ENCOUNTER — Encounter: Payer: Self-pay | Admitting: Internal Medicine

## 2017-01-25 VITALS — BP 114/76 | HR 74 | Ht 75.0 in | Wt 157.0 lb

## 2017-01-25 DIAGNOSIS — Z23 Encounter for immunization: Secondary | ICD-10-CM

## 2017-01-25 DIAGNOSIS — J4550 Severe persistent asthma, uncomplicated: Secondary | ICD-10-CM | POA: Diagnosis not present

## 2017-01-25 DIAGNOSIS — J4540 Moderate persistent asthma, uncomplicated: Secondary | ICD-10-CM

## 2017-01-25 NOTE — Progress Notes (Signed)
Patient ID: Drew Sandoval, male    DOB: 1976-01-22    MRN: [id number]  HPI M  former smoker followed for severe asthma/ COPD, chronic sinusitis, hyper-eosinophilia and hyper IgE, complicated by history SVT/cardioversion FENO 03/18/16- 100  High, indicates allergic asthma Office Spirometry 03/18/2016-severe obstructive airways disease FVC 2.62/51%, FEV1 1.30/31%, FEV1/FVC 0.50, FEF 25-75 0.61/14% Office Spirometry 01/25/17-severe obstructive airways disease. FVC 3.52/68%, FEV1 1.80/43%, ratio or 0.51, FEF 25-75% 0.76/18% IgE was too high for Xolair. He could not afford Nucala or allergy vaccine  ----------------------------------------------------  09/23/16- 41 year old male former smoker followed for severe asthma, chronic sinusitis, persistent eosinophilia, hyper IgE-too high for Xolair, history SVT/cardioversion FOLLOW UP FOR patient last seen for asthma exacerbation he states that he is better.  He tells me he is "good now".When asked how recently he had a bad day-4 days ago he needed to take 2 extra prednisone tablets and a nebulizer treatment. Otherwise remains on baseline 10 mg prednisone daily. This has substantially reduced his number of emergency room visits. Continues Symbicort, Singulair. Daily use of rescue inhaler. Still waiting for action on his Cinqair biologic application-he thinks Florentina Addison is working on this but I will need to verify that. He asks recommendations for symptoms of allergic rhinitis-discussed antihistamines and Flonase.  01/25/17- 41 year old male former smoker followed for severe asthma/ COPD, chronic sinusitis, persistent eosinophilia, hyper IgE-too high for Xolair, history SVT/cardioversion FOLLOWS FOR: Pt states he is doing well overall. No flares since last visit 09-23-16. Never started Cinquair-Insurance issues.  Continues maintenance prednisone 10 mg daily. Occasional use of rescue inhaler. No ER visits. 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 Office Spirometry 01/25/17-severe obstructive airways disease. FVC 3.52/68%, FEV1 1.80/43%, ratio or 0.51, FEF 25-75% 0.76/18%  ROS-see HPI  "+" = pos Constitutional:    weight loss, night sweats, fevers, chills, fatigue, lassitude. HEENT:    headaches, difficulty swallowing, tooth/dental problems, sore throat,       sneezing, itching, ear ache, +nasal congestion, +post nasal drip, snoring CV:    chest pain, orthopnea, PND, swelling in lower extremities, anasarca,                                                        dizziness, palpitations Resp:   +shortness of breath with exertion or at rest.                productive cough,  non-productive cough, coughing up of blood.              change in color of mucus.  wheezing.   Skin:    rash or lesions. GI:  No-   heartburn, indigestion, abdominal pain, nausea, vomiting,  GU: d. MS:   joint pain, stiffness, decreased range of motion, back pain. Neuro-     nothing unusual Psych:  change in mood or affect.  depression or anxiety.   memory loss.  Objective:   Physical Exam General- Alert, Oriented, Affect-reserved/ laconic, Distress-none acute;  Tall, thin Skin- +tatoo Lymphadenopathy- none Head- atraumatic            Eyes- Gross vision intact, PERRLA, conjunctivae clear secretions            Ears- Hearing,  canals normal            Nose- Clear, no-Septal dev, mucus, polyps, erosion, perforation             Throat- Mallampati II , mucosa clear , drainage- none, tonsils- atrophic Neck- flexible , trachea midline, no stridor , thyroid nl, carotid no bruit Chest - symmetrical excursion , unlabored           Heart/CV- RRR  , no murmur , no gallop  , no rub, nl s1 s2                           - JVD- none , edema- none, stasis changes- none, varices- none           Lung-   diminished, unlabored,  Cough-none , dullness-none, rub- none,  Wheeze-none Abd-  scaphoid  Br/ Gen/ Rectal- Not done, not indicated Extrem- cyanosis- none, clubbing, none, atrophy- none, strength- nl Neuro- +slight tremor

## 2017-01-25 NOTE — Assessment & Plan Note (Addendum)
Severe chronic obstructive pattern slightly improved on spirometry at this visit. The best diagnosis may still be fixed chronic asthma although impact of his remote smoking is unclear and this might be considered and asthma/COPD pattern. When he is able to need formal PFT with diffusion capacity. 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 Plan-continue current meds. Refill rescue inhaler. Flu vaccine. We're going to try to reduce his chronic maintenance prednisone from 10 mg to 5 mg daily by splitting his current supply.

## 2017-01-25 NOTE — Patient Instructions (Signed)
Order- office spirometry   Dx asthma moderate persistent  Flu vax- standard  We want to reduce your daily maintenance prednisone-         Please cut your 10 mg tabs in half so you can take 1/2 tab ( 5 mg) daily   Please call if we can help

## 2017-01-31 ENCOUNTER — Telehealth: Payer: Self-pay | Admitting: Internal Medicine

## 2017-01-31 MED ORDER — MONTELUKAST SODIUM 10 MG PO TABS
10.0000 mg | ORAL_TABLET | Freq: Every day | ORAL | 11 refills | Status: DC
Start: 2017-01-31 — End: 2018-02-13

## 2017-01-31 NOTE — Telephone Encounter (Signed)
Spoke with pt, requesting singulair refill.  This has been sent to preferred pharmacy.  Nothing further needed.

## 2017-02-14 ENCOUNTER — Telehealth: Payer: Self-pay | Admitting: Internal Medicine

## 2017-02-14 NOTE — Telephone Encounter (Signed)
Drew Sandoval, that's the one biologic I'm not responsible for. His Dr.'s nurse takes care of cinquair. He's CY's pt. I'll route it to [employer].

## 2017-02-14 NOTE — Telephone Encounter (Signed)
Spoke with a representative at  [employer]. She is going to refax the information to me so I can complete PA. Dimas Millin can you check to see if you received the PA. Thanks

## 2017-02-15 ENCOUNTER — Telehealth: Payer: Self-pay | Admitting: Internal Medicine

## 2017-02-15 MED ORDER — PREDNISONE 10 MG PO TABS: ORAL_TABLET | ORAL | 0 refills | Status: DC

## 2017-02-15 NOTE — Telephone Encounter (Signed)
Pt c/o increased sob, chest tightness, nonprod cough X1 week.   Denies fever, mucus production, sharp chest pains.   Pt has been taking maintenance and rescue meds to help with s/s- daily prednisone, symbicort, albuterol.  Requesting further recs.    [facility] uses [facility] [place].   Sending to TP since CY has left for the day.  Please advise. Thanks!

## 2017-02-15 NOTE — Telephone Encounter (Signed)
Per KW, per SN: pred taper  #40 4X2, 3X2, 2X2, 1X2. No refills.  -----------  Pt aware of recs.  rx sent to preferred pharmacy.  Nothing further needed.

## 2017-02-16 ENCOUNTER — Encounter: Payer: Self-pay | Admitting: Internal Medicine

## 2017-02-16 NOTE — Telephone Encounter (Signed)
CY is completing letter of medical necessity and I will fax with records once done.

## 2017-02-28 NOTE — Telephone Encounter (Signed)
Drew Sandoval with [employer] checking to see if info for prior approval sent .  Tr [phone] She said any of the agents answering the phone can help.

## 2017-02-28 NOTE — Telephone Encounter (Signed)
No updates at this time

## 2017-02-28 NOTE — Telephone Encounter (Signed)
Katie or Tammy, please advise. Thanks

## 2017-03-08 ENCOUNTER — Other Ambulatory Visit: Payer: Self-pay | Admitting: Internal Medicine

## 2017-03-09 NOTE — Telephone Encounter (Signed)
TS any updates on the PA yet?   Thanks

## 2017-03-09 NOTE — Telephone Encounter (Signed)
No ma'am, the Dr's nurse does cinquair. [facility].

## 2017-03-11 ENCOUNTER — Telehealth: Payer: Self-pay | Admitting: Internal Medicine

## 2017-03-11 NOTE — Telephone Encounter (Signed)
KW please advise of any updates on the approval or denial of the cinquair.  Thanks

## 2017-03-14 ENCOUNTER — Encounter (HOSPITAL_COMMUNITY): Payer: Self-pay | Admitting: Cardiology

## 2017-03-14 ENCOUNTER — Emergency Department (HOSPITAL_COMMUNITY)
Admission: EM | Admit: 2017-03-14 | Discharge: 2017-03-14 | Disposition: A | Discharge status: Home / Self Care | Payer: BLUE CROSS/BLUE SHIELD | Attending: Emergency Medicine | Admitting: Emergency Medicine

## 2017-03-14 ENCOUNTER — Emergency Department (HOSPITAL_COMMUNITY): Payer: BLUE CROSS/BLUE SHIELD

## 2017-03-14 DIAGNOSIS — R05 Cough: Secondary | ICD-10-CM | POA: Diagnosis present

## 2017-03-14 DIAGNOSIS — Z79899 Other long term (current) drug therapy: Secondary | ICD-10-CM | POA: Diagnosis not present

## 2017-03-14 DIAGNOSIS — Z87891 Personal history of nicotine dependence: Secondary | ICD-10-CM | POA: Insufficient documentation

## 2017-03-14 DIAGNOSIS — J0110 Acute frontal sinusitis, unspecified: Secondary | ICD-10-CM | POA: Diagnosis not present

## 2017-03-14 DIAGNOSIS — J4551 Severe persistent asthma with (acute) exacerbation: Secondary | ICD-10-CM | POA: Insufficient documentation

## 2017-03-14 MED ORDER — ALBUTEROL SULFATE (2.5 MG/3ML) 0.083% IN NEBU
5.0000 mg | INHALATION_SOLUTION | Freq: Once | RESPIRATORY_TRACT | Status: AC
  Administered 2017-03-14: 5 mg via RESPIRATORY_TRACT
  Filled 2017-03-14: qty 6

## 2017-03-14 MED ORDER — KETOROLAC TROMETHAMINE 60 MG/2ML IM SOLN
60.0000 mg | Freq: Once | INTRAMUSCULAR | Status: AC
  Administered 2017-03-14: 60 mg via INTRAMUSCULAR
  Filled 2017-03-14: qty 2

## 2017-03-14 MED ORDER — DOXYCYCLINE HYCLATE 100 MG PO CAPS: 100.0000 mg | ORAL_CAPSULE | Freq: Two times a day (BID) | ORAL | 0 refills | Status: DC

## 2017-03-14 MED ORDER — PREDNISONE 50 MG PO TABS
60.0000 mg | ORAL_TABLET | Freq: Once | ORAL | Status: AC
  Administered 2017-03-14: 60 mg via ORAL
  Filled 2017-03-14: qty 1

## 2017-03-14 MED ORDER — PREDNISONE 50 MG PO TABS: ORAL_TABLET | ORAL | 0 refills | Status: DC

## 2017-03-14 NOTE — ED Provider Notes (Signed)
[facility] EMERGENCY DEPARTMENT Provider Note   CSN: [id number] Arrival date & time: 03/14/17  0708     History   Chief Complaint Chief Complaint  Patient presents with  . Cough    HPI Drew Sandoval is a 41 y.o. 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  He also endorses right frontal sinus pain and pressure and suspects he has a sinus infection.  He denies nasal discharge but has had copious postnasal drip.  Denies nosebleeds.  He has had no medications prior to arrival this morning.  He is followed by Dr. [name] of pulmonology and is supposed to be on daily prednisone 10 mg maintenance dosing but ran out of this medication last week.  He denies chest pain, fevers or chills, myalgias, rash.  The history is provided by the patient.  Cough  Associated symptoms include shortness of breath and wheezing. Pertinent negatives include no chest pain, no headaches and no sore throat.    Past Medical History:  Diagnosis Date  . Asthma   . Seasonal allergies     Patient Active Problem List   Diagnosis Date Noted  . Hypokalemia 03/10/2016  . Hypoxia 10/28/2014  . Acute conjunctivitis of right eye 10/07/2014  . Sustained SVT (HCC) 09/24/2014  . SVT (supraventricular tachycardia) (HCC) 09/24/2014  . Malnutrition of moderate degree (HCC) 09/24/2014  . Underweight 09/24/2014  . Seasonal and perennial allergic rhinitis 07/17/2014  . Acute respiratory failure with hypoxia (HCC) 07/23/2013  . Acute respiratory failure (HCC) 07/23/2013  . Asthma exacerbation 06/27/2013  . 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 Eczema, allergic 07/22/2011  .  Status asthmaticus 10/19/2010  . Sinusitis, maxillary, 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  . 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 Asthma, severe persistent 07/16/2010    Past Surgical History:  Procedure Laterality Date  . None         Home Medications    Prior to Admission medications   Medication Sig Start Date End Date Taking? Authorizing Provider  albuterol (PROVENTIL HFA;VENTOLIN HFA) 108 (90 Base) MCG/ACT inhaler Inhale 2 puffs into the lungs every 4 (four) hours as needed. 08/18/16  Yes Young, Joni Fearslinton D, MD  albuterol (PROVENTIL) (2.5 MG/3ML) 0.083% nebulizer solution Take 3 mLs (2.5 mg total) by nebulization every 6 (six) hours as needed for wheezing or shortness of breath. 09/22/15  Yes Jetty DuhamelYoung, Clinton D, MD  budesonide-formoterol [facility](SYMBICORT) 160-4.5 MCG/ACT inhaler 2 puffs twice daily, then rinse mouth 03/11/16  Yes Philip AspenHernandez Acosta, Limmie PatriciaEstela Y, MD  EPINEPHrine 0.3 mg/0.3 mL IJ SOAJ injection Inject into thigh for severe allergic reaction Patient taking differently: Inject 0.3 mg into the muscle once. Inject into thigh for severe allergic reaction 04/29/15  Yes Young, Clinton D, MD  montelukast (SINGULAIR) 10 MG tablet Take 1 tablet (10 mg total) by mouth at bedtime. 01/31/17  Yes Young, Joni Fearslinton D, MD  doxycycline (VIBRAMYCIN) 100 MG capsule Take 1 capsule (100 mg total) 2 (two) times daily by mouth. 03/14/17   Burgess AmorIdol, Mikaella Escalona, PA-C  predniSONE (DELTASONE) 50 MG tablet 1 tablet by mouth daily for 5 days 03/14/17   Burgess AmorIdol, Maikol Grassia, PA-C    Family History Family History  Problem Relation Age of Onset  . Emphysema Father   .  Lung cancer Mother     Social History Social History   Tobacco Use  . Smoking status: Former Smoker    Packs/day: 0.50    Years: 11.50    Pack years: 5.75    Types: Cigarettes    Last attempt to quit: 04/09/2010    Years since quitting: 6.9  . Smokeless tobacco: Former Neurosurgeon  . Tobacco comment: started at age 62.  1/2 ppd.    Substance Use Topics  . Alcohol use: No  . Drug  use: No     Allergies   Azithromycin; Alvesco [ciclesonide]; Anoro ellipta [umeclidinium-vilanterol]; Banana; and Penicillins   Review of Systems Review of Systems  Constitutional: Positive for fever.  HENT: Positive for postnasal drip, sinus pressure and sinus pain. Negative for congestion and sore throat.   Eyes: Negative.   Respiratory: Positive for cough, shortness of breath and wheezing. Negative for chest tightness.   Cardiovascular: Negative for chest pain.  Gastrointestinal: Negative for abdominal pain and nausea.  Genitourinary: Negative.   Musculoskeletal: Negative for arthralgias, joint swelling and neck pain.  Skin: Negative.  Negative for rash and wound.  Neurological: Negative for dizziness, weakness, light-headedness, numbness and headaches.  Psychiatric/Behavioral: Negative.      Physical Exam Updated Vital Signs BP 123/86 (BP Location: Right Arm)   Pulse 71   Temp 98.9 F (37.2 C) (Oral)   Resp 16   Ht 6\' 2"  (1.88 m)   Wt 73 kg (161 lb)   SpO2 99%   BMI 20.67 kg/m   Physical Exam  Constitutional: He appears well-developed and well-nourished.  HENT:  Head: Normocephalic and atraumatic.  Eyes: Conjunctivae are normal.  Neck: Normal range of motion.  Cardiovascular: Normal rate, regular rhythm, normal heart sounds and intact distal pulses.  Pulmonary/Chest: Effort normal. No accessory muscle usage. He has decreased breath sounds in the right lower field and the left lower field. He has wheezes in the right upper field and the left upper field. He has no rhonchi. He has no rales.  Prolonged expirations  Abdominal: Soft. Bowel sounds are normal. There is no tenderness.  Musculoskeletal: Normal range of motion.  Neurological: He is alert.  Skin: Skin is warm and dry.  Psychiatric: He has a normal mood and affect.  Nursing note and vitals reviewed.    ED Treatments / Results  Labs (all labs ordered are listed, but only abnormal results are  displayed) Labs Reviewed - No data to display  EKG  EKG Interpretation None       Radiology Dg Chest 2 View  Result Date: 03/14/2017 CLINICAL DATA:  Three weeks of cough and chest congestion. Also head congestion. History of asthma, former smoker. EXAM: CHEST  2 VIEW COMPARISON:  Chest x-ray of March 10, 2016 FINDINGS: The lungs are hyperinflated with hemidiaphragm flattening. The heart and pulmonary vascularity are normal. The mediastinum is normal in width. The trachea is midline. The bony thorax exhibits no acute abnormality. IMPRESSION: Hyperinflation consistent with reactive airway disease. No pneumonia nor other acute cardiopulmonary abnormality. Electronically Signed   By: David  [place] M.D.   On: 03/14/2017 08:55    Procedures Procedures (including critical care time)  Medications Ordered in ED Medications  albuterol (PROVENTIL) (2.5 MG/3ML) 0.083% nebulizer solution 5 mg (5 mg Nebulization Given 03/14/17 0849)  predniSONE (DELTASONE) tablet 60 mg (60 mg Oral Given 03/14/17 0838)  ketorolac (TORADOL) injection 60 mg (60 mg Intramuscular Given 03/14/17 1054)     Initial Impression / Assessment and Plan /  ED Course  I have reviewed the triage vital signs and the nursing notes.  Pertinent labs & imaging results that were available during my care of the patient were reviewed by me and considered in my medical decision making (see chart for details).     Patient was given albuterol nebulizer treatment and prednisone 60 mg orally.  At reexam patient's aeration was much improved, breath sounds heard at bilateral bases, wheezing resolved.  His work of breathing was improved.  He was given Toradol with complete resolution of his frontal headache pain.  He was given a spacer for home use with his albuterol which he states he lost.  PRN follow-up anticipated, strict return precautions were discussed.  Final Clinical Impressions(s) / ED Diagnoses   Final diagnoses:  Severe  persistent asthma with exacerbation  Acute frontal sinusitis, recurrence not specified    ED Discharge Orders        Ordered    predniSONE (DELTASONE) 50 MG tablet     03/14/17 1150    doxycycline (VIBRAMYCIN) 100 MG capsule  2 times daily     03/14/17 1150       Burgess Amor, PA-C 03/14/17 1157    Eber Hong, MD 03/15/17 [phone]

## 2017-03-14 NOTE — ED Triage Notes (Signed)
Head congestion , cough and wheezing for 3 weeks.

## 2017-03-14 NOTE — Discharge Instructions (Signed)
Take your next dose of prednisone tomorrow morning as you have received today's dose here.  Start the doxycycline which should resolve your sinus infection symptoms.  Get rechecked for any worsening symptoms.  Continue your other home medications and inhalers.

## 2017-03-22 NOTE — Telephone Encounter (Signed)
Katie can you give an update on this? Thanks. 

## 2017-03-22 NOTE — Telephone Encounter (Signed)
Drew Sandoval with [employer] is following up on prior approval. [phone].

## 2017-03-24 NOTE — Telephone Encounter (Signed)
Sent message to Irving Burtonmily today working on PA's to see if there is an update on the Pa for this patient.

## 2017-03-29 NOTE — Telephone Encounter (Signed)
KW please advise.  Thanks  

## 2017-03-29 NOTE — Telephone Encounter (Signed)
This is still being worked on-pt has a [employer] and takes longer time. Thanks.

## 2017-03-29 NOTE — Telephone Encounter (Signed)
KW please advise on Cinqair rx.  Thanks!

## 2017-04-05 ENCOUNTER — Telehealth: Payer: Self-pay | Admitting: Internal Medicine

## 2017-04-06 NOTE — Telephone Encounter (Signed)
Katie please advise of any new updates on PA, as [employer] is requesting update. I have made Darenda with [employer] aware that per 03/11/17 phone note, we are still in process of PA.

## 2017-04-11 NOTE — Telephone Encounter (Signed)
Katie, please advise on update. Thanks.  

## 2017-04-15 NOTE — Telephone Encounter (Signed)
KW was this taken care of so we can close encounter?

## 2017-04-20 NOTE — Telephone Encounter (Signed)
Katie please advise. Thanks. 

## 2017-04-25 ENCOUNTER — Telehealth: Payer: Self-pay | Admitting: Internal Medicine

## 2017-04-25 NOTE — Telephone Encounter (Signed)
This call is regarding pt's [employer]air. KW please advise.  Thanks.

## 2017-04-25 NOTE — Telephone Encounter (Signed)
Any updates on PA so we can close this encounter?

## 2017-04-26 NOTE — Telephone Encounter (Signed)
Spoke with rep at [employer] ; they will close the request at this time. Pt will not be starting this medication. If patient signs up for new insurance and decides to restart the process we will do so at a later date.

## 2017-05-14 ENCOUNTER — Encounter (HOSPITAL_COMMUNITY): Payer: Self-pay

## 2017-05-14 ENCOUNTER — Emergency Department (HOSPITAL_COMMUNITY)
Admission: EM | Admit: 2017-05-14 | Discharge: 2017-05-14 | Disposition: A | Discharge status: Home / Self Care | Payer: BLUE CROSS/BLUE SHIELD | Attending: Emergency Medicine | Admitting: Emergency Medicine

## 2017-05-14 DIAGNOSIS — Z79899 Other long term (current) drug therapy: Secondary | ICD-10-CM | POA: Diagnosis not present

## 2017-05-14 DIAGNOSIS — J209 Acute bronchitis, unspecified: Secondary | ICD-10-CM | POA: Diagnosis not present

## 2017-05-14 DIAGNOSIS — J0110 Acute frontal sinusitis, unspecified: Secondary | ICD-10-CM | POA: Insufficient documentation

## 2017-05-14 DIAGNOSIS — R05 Cough: Secondary | ICD-10-CM | POA: Diagnosis present

## 2017-05-14 DIAGNOSIS — J40 Bronchitis, not specified as acute or chronic: Secondary | ICD-10-CM

## 2017-05-14 DIAGNOSIS — J45909 Unspecified asthma, uncomplicated: Secondary | ICD-10-CM | POA: Diagnosis not present

## 2017-05-14 DIAGNOSIS — Z87891 Personal history of nicotine dependence: Secondary | ICD-10-CM | POA: Insufficient documentation

## 2017-05-14 MED ORDER — PREDNISONE 10 MG PO TABS: 10.0000 mg | ORAL_TABLET | Freq: Every day | ORAL | 0 refills | Status: DC

## 2017-05-14 MED ORDER — DOXYCYCLINE HYCLATE 100 MG PO CAPS: 100.0000 mg | ORAL_CAPSULE | Freq: Two times a day (BID) | ORAL | 0 refills | Status: DC

## 2017-05-14 NOTE — Discharge Instructions (Signed)
Prescription for antibiotic and prednisone.  Continue your inhalers and over-the-counter medications.  Increase fluids.  Rest.

## 2017-05-14 NOTE — ED Provider Notes (Signed)
[facility] EMERGENCY DEPARTMENT Provider Note   CSN: [id number] Arrival date & time: 05/14/17  0707     History   Chief Complaint Chief Complaint  Patient presents with  . Cough    HPI Drew Sandoval is a 42 y.o. male.  Pain in right frontal sinus and persistent productive cough with green sputum for 5 days.  Patient has asthma and has been using his Symbicort and albuterol.  Additionally, he has taking over-the-counter medications like Mucinex and Robitussin.  No fever, sweats, chills, meningeal signs.  Area of symptoms is mild.      Past Medical History:  Diagnosis Date  . Asthma   . Seasonal allergies     Patient Active Problem List   Diagnosis Date Noted  . Hypokalemia 03/10/2016  . Hypoxia 10/28/2014  . Acute conjunctivitis of right eye 10/07/2014  . Sustained SVT (HCC) 09/24/2014  . SVT (supraventricular tachycardia) (HCC) 09/24/2014  . Malnutrition of moderate degree (HCC) 09/24/2014  . Underweight 09/24/2014  . Seasonal and perennial allergic rhinitis 07/17/2014  . Acute respiratory failure with hypoxia (HCC) 07/23/2013  . Acute respiratory failure (HCC) 07/23/2013  . Asthma exacerbation 06/27/2013  . 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 Eczema, allergic 07/22/2011  . Status asthmaticus 10/19/2010  . Sinusitis, maxillary, 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  . 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 Asthma, severe persistent 07/16/2010    Past Surgical History:  Procedure Laterality Date  . None         Home Medications    Prior to Admission medications   Medication Sig Start Date End Date Taking? Authorizing Provider  albuterol (PROVENTIL HFA;VENTOLIN HFA) 108 (90 Base) MCG/ACT inhaler Inhale 2 puffs into the lungs every 4 (four) hours as needed. 08/18/16   Jetty Duhamel D, MD  albuterol (PROVENTIL) (2.5 MG/3ML) 0.083% nebulizer solution Take 3 mLs (2.5 mg total) by nebulization every 6 (six) hours as needed for wheezing or shortness of breath. 09/22/15   [name], MD  budesonide-formoterol [facility]) 160-4.5 MCG/ACT inhaler 2 puffs twice daily, then rinse mouth 03/11/16   Philip Aspen, Limmie Patricia, MD  doxycycline (VIBRAMYCIN) 100 MG capsule Take 1 capsule (100 mg total) by mouth 2 (two) times daily. 05/14/17   Donnetta Hutching, MD  EPINEPHrine 0.3 mg/0.3 mL IJ SOAJ injection Inject into thigh for severe allergic reaction Patient taking differently: Inject 0.3 mg into the muscle once. Inject into thigh for severe allergic reaction 04/29/15   Jetty Duhamel D, MD  montelukast (SINGULAIR) 10 MG tablet Take 1 tablet (10 mg total) by mouth at bedtime. 01/31/17   [name], MD  predniSONE (DELTASONE) 10 MG tablet Take 1 tablet (10 mg total) by mouth daily with breakfast. 3 tablets for 3 days, 2 tablets for 3 days, 1 tablet for 3 days 05/14/17   Donnetta Hutching, MD    Family History Family History  Problem Relation Age of Onset  . Emphysema Father   . Lung cancer Mother     Social History Social History   Tobacco Use  . Smoking status: Former Smoker    Packs/day: 0.50    Years: 11.50    Pack years: 5.75    Types: Cigarettes    Last attempt to quit: 04/09/2010    Years since quitting: 7.1  . Smokeless tobacco: Former Neurosurgeon  . Tobacco comment: started at age 42.  1/2 ppd.    Substance Use Topics  . Alcohol use: No  . Drug use: No     Allergies  Azithromycin; Alvesco [ciclesonide]; Anoro ellipta [umeclidinium-vilanterol]; Banana; and Penicillins   Review of Systems Review of Systems  All other systems reviewed and are negative.    Physical Exam Updated Vital Signs BP 129/76 (BP Location: Left Arm)   Pulse 80   Temp 99 F (37.2 C) (Oral)   Resp 20   Ht 6\' 2"  (1.88 m)   Wt 79.4 kg (175 lb)   SpO2 97%   BMI 22.47 kg/m   Physical Exam  Constitutional: He is oriented to person, place, and time. He appears well-developed and well-nourished.  nad  HENT:  Head: Normocephalic and atraumatic.  Tender over right frontal sinus    Eyes: Conjunctivae are normal.  Neck: Neck supple.  Cardiovascular: Normal rate and regular rhythm.  Pulmonary/Chest: Effort normal and breath sounds normal.  Abdominal: Soft. Bowel sounds are normal.  Musculoskeletal: Normal range of motion.  Neurological: He is alert and oriented to person, place, and time.  Skin: Skin is warm and dry.  Psychiatric: He has a normal mood and affect. His behavior is normal.  Nursing note and vitals reviewed.    ED Treatments / Results  Labs (all labs ordered are listed, but only abnormal results are displayed) Labs Reviewed - No data to display  EKG  EKG Interpretation None       Radiology No results found.  Procedures Procedures (including critical care time)  Medications Ordered in ED Medications - No data to display   Initial Impression / Assessment and Plan / ED Course  I have reviewed the triage vital signs and the nursing notes.  Pertinent labs & imaging results that were available during my care of the patient were reviewed by me and considered in my medical decision making (see chart for details).     Presents with persistent productive cough and right frontal sinus tenderness.  He is allergic to penicillins and macrolides.  Will Rx doxycycline 100 mg and prednisone.  Final Clinical Impressions(s) / ED Diagnoses   Final diagnoses:  Bronchitis  Acute non-recurrent frontal sinusitis    ED Discharge Orders        Ordered    doxycycline (VIBRAMYCIN) 100 MG capsule  2 times daily     05/14/17 0810    predniSONE (DELTASONE) 10 MG tablet  Daily with breakfast     05/14/17 0810       Donnetta Hutchingook, Kyanne Rials, MD 05/14/17 [phone]

## 2017-05-14 NOTE — ED Triage Notes (Signed)
Pt reports cold symptoms since new year's eve.  Reports pain in chest with coughing.

## 2017-05-27 ENCOUNTER — Telehealth: Payer: Self-pay | Admitting: Internal Medicine

## 2017-05-27 ENCOUNTER — Other Ambulatory Visit (INDEPENDENT_AMBULATORY_CARE_PROVIDER_SITE_OTHER): Payer: BLUE CROSS/BLUE SHIELD

## 2017-05-27 ENCOUNTER — Ambulatory Visit: Payer: BLUE CROSS/BLUE SHIELD | Admitting: Internal Medicine

## 2017-05-27 ENCOUNTER — Encounter: Payer: Self-pay | Admitting: Internal Medicine

## 2017-05-27 VITALS — BP 116/76 | HR 88 | Ht 75.0 in | Wt 167.8 lb

## 2017-05-27 DIAGNOSIS — J4550 Severe persistent asthma, uncomplicated: Secondary | ICD-10-CM

## 2017-05-27 DIAGNOSIS — J302 Other seasonal allergic rhinitis: Secondary | ICD-10-CM

## 2017-05-27 DIAGNOSIS — J3089 Other allergic rhinitis: Secondary | ICD-10-CM | POA: Diagnosis not present

## 2017-05-27 LAB — CBC WITH DIFFERENTIAL/PLATELET
Basophils Absolute: 0.1 10*3/uL (ref 0.0–0.1)
Basophils Relative: 0.9 % (ref 0.0–3.0)
EOS ABS: 0.6 10*3/uL (ref 0.0–0.7)
Eosinophils Relative: 6.1 % — ABNORMAL HIGH (ref 0.0–5.0)
HEMATOCRIT: 39.3 % (ref 39.0–52.0)
HEMOGLOBIN: 13 g/dL (ref 13.0–17.0)
LYMPHS PCT: 34.6 % (ref 12.0–46.0)
Lymphs Abs: 3.5 10*3/uL (ref 0.7–4.0)
MCHC: 33.1 g/dL (ref 30.0–36.0)
MCV: 92.1 fl (ref 78.0–100.0)
MONO ABS: 0.8 10*3/uL (ref 0.1–1.0)
Monocytes Relative: 8.5 % (ref 3.0–12.0)
Neutro Abs: 5 10*3/uL (ref 1.4–7.7)
Neutrophils Relative %: 49.9 % (ref 43.0–77.0)
Platelets: 250 10*3/uL (ref 150.0–400.0)
RBC: 4.27 Mil/uL (ref 4.22–5.81)
RDW: 14.2 % (ref 11.5–15.5)
WBC: 10 10*3/uL (ref 4.0–10.5)

## 2017-05-27 MED ORDER — ALBUTEROL SULFATE HFA 108 (90 BASE) MCG/ACT IN AERS: 2.0000 | INHALATION_SPRAY | RESPIRATORY_TRACT | 5 refills | Status: DC | PRN

## 2017-05-27 MED ORDER — FLUTICASONE FUROATE-VILANTEROL 200-25 MCG/INH IN AEPB: 1.0000 | INHALATION_SPRAY | Freq: Every day | RESPIRATORY_TRACT | 12 refills | Status: DC

## 2017-05-27 MED ORDER — ALBUTEROL SULFATE (2.5 MG/3ML) 0.083% IN NEBU: 2.5000 mg | INHALATION_SOLUTION | Freq: Four times a day (QID) | RESPIRATORY_TRACT | 5 refills | Status: DC | PRN

## 2017-05-27 MED ORDER — FLUTICASONE FUROATE-VILANTEROL 200-25 MCG/INH IN AEPB: 1.0000 | INHALATION_SPRAY | Freq: Every day | RESPIRATORY_TRACT | 0 refills | Status: DC

## 2017-05-27 NOTE — Patient Instructions (Addendum)
Order lab-CBC with differential  DX asthma exacerbation   Script sent refilling nebulizer solution  Sample and printed script to try Breo 200    Inhale 1 puff, then rinse mouth, once daily.  Try this instead of Symbicort to see if it causes less shakiness.  Order- lab- CBC w diff     Dx Asthma severe persistent  We will complete Cinqair application for you today. Hopefully regular infusion of this medicine will cut down how often you have asthma flare ups.   Please call if you need our help.

## 2017-05-27 NOTE — Progress Notes (Signed)
Patient ID: Drew Sandoval, male    DOB: 1976-02-16    MRN: [id number]  HPI M  former smoker followed for severe asthma/ COPD, chronic sinusitis, hyper-eosinophilia and hyper IgE, complicated by history SVT/cardioversion FENO 03/18/16- 100  High, indicates allergic asthma Office Spirometry 03/18/2016-severe obstructive airways disease FVC 2.62/51%, FEV1 1.30/31%, FEV1/FVC 0.50, FEF 25-75 0.61/14% Office Spirometry 01/25/17-severe obstructive airways disease. FVC 3.52/68%, FEV1 1.80/43%, ratio or 0.51, FEF 25-75% 0.76/18% IgE was too high for Xolair. He could not afford Nucala or allergy vaccine  ----------------------------------------------------  01/25/17- 42 year old male former smoker followed for severe asthma/ COPD, chronic sinusitis, persistent eosinophilia, hyper IgE-too high for Xolair, history SVT/cardioversion FOLLOWS FOR: Pt states he is doing well overall. No flares since last visit 09-23-16. Never started Cinquair-Insurance issues.  Continues maintenance prednisone 10 mg daily. Occasional use of rescue inhaler. No ER visits. He still works a Surveyor, [employer] changed from first shift, when there is cooking smoke, to third shift or he is not exposed to smoke or cleaning chemicals. Office Spirometry 01/25/17-severe obstructive airways disease. FVC 3.52/68%, FEV1 1.80/43%, ratio or 0.51, FEF 25-75% 0.76/18%  05/27/17- 42 year old male former smoker followed for severe asthma/ COPD, chronic sinusitis, persistent eosinophilia, hyper IgE-too high for Xolair, history SVT/cardioversion ----Asthma: Pt has been [facility] times recently for flare ups. Pt was not able to start Cinquair-would like to try again-signed forms today.  Currently on prednisone therapy after ER visit 2 weeks ago. First exacerbation "woke up like that".  Second treated at ER as bronchitis with antibiotic, no testing. Feels near baseline today without cough, wheeze or phlegm.  Has had flu shot. CXR  03/14/17- IMPRESSION: Hyperinflation consistent with reactive airway disease. No pneumonia nor other acute cardiopulmonary abnormality.  ROS-see HPI  "+" = pos Constitutional:    weight loss, night sweats, fevers, chills, fatigue, lassitude. HEENT:    headaches, difficulty swallowing, tooth/dental problems, sore throat,       sneezing, itching, ear ache, +nasal congestion, +post nasal drip, snoring CV:    chest pain, orthopnea, PND, swelling in lower extremities, anasarca,                                                        dizziness, palpitations Resp:   +shortness of breath with exertion or at rest.                productive cough,  non-productive cough, coughing up of blood.              change in color of mucus.  wheezing.   Skin:    rash or lesions. GI:  No-   heartburn, indigestion, abdominal pain, nausea, vomiting,  GU: d. MS:   joint pain, stiffness, decreased range of motion, back pain. Neuro-     nothing unusual Psych:  change in mood or affect.  depression or anxiety.   memory loss.  Objective:   Physical Exam General- Alert, Oriented, Affect-reserved/ laconic, Distress-none acute;  Tall, thin Skin- +tatoo Lymphadenopathy- none Head- atraumatic            Eyes- Gross vision intact, PERRLA, conjunctivae clear secretions            Ears- Hearing, canals normal            Nose- Clear, no-Septal dev,  mucus, polyps, erosion, perforation             Throat- Mallampati II , mucosa clear , drainage- none, tonsils- atrophic Neck- flexible , trachea midline, no stridor , thyroid nl, carotid no bruit Chest - symmetrical excursion , unlabored           Heart/CV- RRR  , no murmur , no gallop  , no rub, nl s1 s2                           - JVD- none , edema- none, stasis changes- none, varices- none           Lung-   diminished, unlabored,  Cough-none , dullness-none, rub- none,  Wheeze-none Abd- scaphoid  Br/ Gen/ Rectal- Not done, not indicated Extrem- cyanosis- none, clubbing,  none, atrophy- none, strength- nl Neuro- +slight tremor

## 2017-05-27 NOTE — Telephone Encounter (Signed)
Rx has been sent to pt's pharmacy. Pt is aware. Nothing further was needed.

## 2017-05-28 NOTE — Assessment & Plan Note (Signed)
We are going to help him today complete paperwork application for Cinqair  We need to update eosinophil count, realizing he has been on prednisone.

## 2017-05-28 NOTE — Assessment & Plan Note (Signed)
Rhiinitis symptoms are not active now

## 2017-06-01 ENCOUNTER — Encounter (HOSPITAL_COMMUNITY): Payer: Self-pay | Admitting: Emergency Medicine

## 2017-06-01 ENCOUNTER — Emergency Department (HOSPITAL_COMMUNITY)
Admission: EM | Admit: 2017-06-01 | Discharge: 2017-06-01 | Disposition: A | Discharge status: Home / Self Care | Payer: BLUE CROSS/BLUE SHIELD | Attending: Emergency Medicine | Admitting: Emergency Medicine

## 2017-06-01 ENCOUNTER — Telehealth: Payer: Self-pay | Admitting: Internal Medicine

## 2017-06-01 ENCOUNTER — Emergency Department (HOSPITAL_COMMUNITY): Payer: BLUE CROSS/BLUE SHIELD

## 2017-06-01 ENCOUNTER — Other Ambulatory Visit: Payer: Self-pay

## 2017-06-01 DIAGNOSIS — Z79899 Other long term (current) drug therapy: Secondary | ICD-10-CM | POA: Insufficient documentation

## 2017-06-01 DIAGNOSIS — Z87891 Personal history of nicotine dependence: Secondary | ICD-10-CM | POA: Diagnosis not present

## 2017-06-01 DIAGNOSIS — R0602 Shortness of breath: Secondary | ICD-10-CM | POA: Diagnosis present

## 2017-06-01 DIAGNOSIS — J4541 Moderate persistent asthma with (acute) exacerbation: Secondary | ICD-10-CM | POA: Insufficient documentation

## 2017-06-01 MED ORDER — METHYLPREDNISOLONE SODIUM SUCC 125 MG IJ SOLR
125.0000 mg | Freq: Once | INTRAMUSCULAR | Status: AC
  Administered 2017-06-01: 125 mg via INTRAVENOUS
  Filled 2017-06-01: qty 2

## 2017-06-01 MED ORDER — ALBUTEROL SULFATE (2.5 MG/3ML) 0.083% IN NEBU
5.0000 mg | INHALATION_SOLUTION | Freq: Once | RESPIRATORY_TRACT | Status: AC
  Administered 2017-06-01: 5 mg via RESPIRATORY_TRACT
  Filled 2017-06-01: qty 6

## 2017-06-01 MED ORDER — DIPHENHYDRAMINE HCL 50 MG/ML IJ SOLN
12.5000 mg | Freq: Once | INTRAMUSCULAR | Status: AC
  Administered 2017-06-01: 12.5 mg via INTRAVENOUS
  Filled 2017-06-01: qty 1

## 2017-06-01 NOTE — ED Triage Notes (Addendum)
Pt reports shortness of breath,chest tightness, non-productive cough since Monday night. Pt reports left sided rib pain and reports neb treatments at home not working. Dyspnea noted at rest.

## 2017-06-01 NOTE — ED Notes (Signed)
Pt agitated and was not receptive to discharge instructions. Encouraged to follow up with PCP. Pt states, "I won't see him for 4 months. I'm just going to be right back up in here." Attempted to hand patient discharge instructions, pt refused and states, "I don't want them" and walked out of the room.

## 2017-06-01 NOTE — Telephone Encounter (Signed)
Pt stated he had the asthma attack when he woke up this morning and went to the ER around 7am. Pt was at the ER until about 1pm.  Pt stated that he had two breathing treatment treatments and a steroid shot but states he might be allergic to the injection he had been given due to throat itching and then received an injection of benadryl after the reaction.  Pt stated he was not sent home on any meds when left the ER.  An appt was scheduled for pt tomorrow, 06/02/17 with TP tomorrow due to the flare-ups pt has been having.  Nothing further needed at this current time.

## 2017-06-01 NOTE — Discharge Instructions (Signed)
Your x-ray is negative for acute changes.  He was maintained on oxygen level above  the 90s throughout your emergency department visit.  Your acute event seems to have responded nicely to nebulizer treatments.  Please continue your current medications from Dr. [name].  Please see Dr. [name] for an office appointment concerning your asthma exacerbation as soon as possible.  Please return to the emergency department if any changes, problems, or concerns before you are seen by Dr. [name].

## 2017-06-01 NOTE — ED Notes (Signed)
Pt ambulated in hallways. Pt's O2 sat on RA during ambulation ranged from 94-96%.

## 2017-06-01 NOTE — ED Provider Notes (Signed)
[facility] EMERGENCY DEPARTMENT Provider Note   CSN: [id number] Arrival date & time: 06/01/17  [id number]     History   Chief Complaint Chief Complaint  Patient presents with  . Shortness of Breath    HPI Drew Sandoval is a 42 y.o. male.  Patient is a 42 year old male who presents to the emergency department with a complaint of difficulty breathing.   Patient states he has a history of asthma.  He complains of shortness of breath and chest tightness.  This problem started on Monday, January 21 and continues in spite of his use of medications.  The patient is seen by Dr. [name] with pulmonology.  The patient states he is taking medications as prescribed but seems to be getting worse instead of better.  He denies coughing up any blood.  He denies any high fever.  He denies any injury or trauma to the chest.  He does not recall being around anyone who has been ill lately.  Is been no vomiting or diarrhea reported and no unusual rash.  Patient states he is not aware of specific triggers for his asthma.  Nothing seems to make the condition worse.  He is on an asthma regiment and this does not seem to be managing his asthma attack at this time.  He presents now for assistance with this issue.      Past Medical History:  Diagnosis Date  . Asthma   . Seasonal allergies     Patient Active Problem List   Diagnosis Date Noted  . Hypokalemia 03/10/2016  . Hypoxia 10/28/2014  . Acute conjunctivitis of right eye 10/07/2014  . Sustained SVT (HCC) 09/24/2014  . SVT (supraventricular tachycardia) (HCC) 09/24/2014  . Malnutrition of moderate degree (HCC) 09/24/2014  . Underweight 09/24/2014  . Seasonal and perennial allergic rhinitis 07/17/2014  . Acute respiratory failure with hypoxia (HCC) 07/23/2013  . Asthma exacerbation 06/27/2013  . 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 Eczema, allergic 07/22/2011  . Status asthmaticus 10/19/2010  . Sinusitis, maxillary, 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  .  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 Asthma, severe persistent 07/16/2010    Past Surgical History:  Procedure Laterality Date  . None         Home Medications    Prior to Admission medications   Medication Sig Start Date End Date Taking? Authorizing Provider  albuterol (PROVENTIL HFA;VENTOLIN HFA) 108 (90 Base) MCG/ACT inhaler Inhale 2 puffs into the lungs every 4 (four) hours as needed. 05/27/17   Jetty DuhamelYoung, Clinton D, MD  albuterol (PROVENTIL) (2.5 MG/3ML) 0.083% nebulizer solution Take 3 mLs (2.5 mg total) by nebulization every 6 (six) hours as needed for wheezing or shortness of breath. 05/27/17   Waymon BudgeYoung, Clinton D, MD  budesonide-formoterol [facility](SYMBICORT) 160-4.5 MCG/ACT inhaler 2 puffs twice daily, then rinse mouth 03/11/16   Philip AspenHernandez Acosta, Limmie PatriciaEstela Y, MD  EPINEPHrine 0.3 mg/0.3 mL IJ SOAJ injection Inject into thigh for severe allergic reaction Patient taking differently: Inject 0.3 mg into the muscle once. Inject into thigh for severe allergic reaction 04/29/15   Young, Joni Fearslinton D, MD  fluticasone furoate-vilanterol (BREO ELLIPTA) 200-25 MCG/INH AEPB Inhale 1 puff into the lungs daily. Rinse mouth 05/27/17   Young, Joni Fearslinton D, MD  fluticasone furoate-vilanterol (BREO ELLIPTA) 200-25 MCG/INH AEPB Inhale 1 puff into the lungs daily. 05/27/17   Jetty DuhamelYoung, Clinton D, MD  montelukast (SINGULAIR) 10 MG tablet Take 1 tablet (10 mg total) by mouth at bedtime. 01/31/17   Waymon BudgeYoung, Clinton D, MD    Family History Family  History  Problem Relation Age of Onset  . Emphysema Father   . Lung cancer Mother     Social History Social History   Tobacco Use  . Smoking status: Former Smoker    Packs/day: 0.50    Years: 11.50    Pack years: 5.75    Types: Cigarettes    Last attempt to quit: 04/09/2010    Years since quitting: 7.1  . Smokeless tobacco: Former Neurosurgeon  . Tobacco comment: started at age 26.  1/2 ppd.    Substance Use Topics  . Alcohol use: No  . Drug use: No     Allergies   Azithromycin;  Alvesco [ciclesonide]; Anoro ellipta [umeclidinium-vilanterol]; Banana; and Penicillins   Review of Systems Review of Systems  Constitutional: Negative for activity change.       All ROS Neg except as noted in HPI  HENT: Negative for nosebleeds.   Eyes: Negative for photophobia and discharge.  Respiratory: Positive for cough, chest tightness, shortness of breath and wheezing.   Cardiovascular: Negative for chest pain and palpitations.  Gastrointestinal: Negative for abdominal pain and blood in stool.  Genitourinary: Negative for dysuria, frequency and hematuria.  Musculoskeletal: Negative for arthralgias, back pain and neck pain.  Skin: Negative.   Neurological: Negative for dizziness, seizures and speech difficulty.  Psychiatric/Behavioral: Negative for confusion and hallucinations.     Physical Exam Updated Vital Signs BP (!) 161/91 (BP Location: Right Arm)   Pulse 89   Temp 98 F (36.7 C) (Oral)   Resp 20   Ht 6\' 3"  (1.905 m)   Wt 75.8 kg (167 lb)   SpO2 98%   BMI 20.87 kg/m   Physical Exam  Constitutional: He is oriented to person, place, and time. He appears well-developed and well-nourished.  Non-toxic appearance.  HENT:  Head: Normocephalic.  Right Ear: Tympanic membrane and external ear normal.  Left Ear: Tympanic membrane and external ear normal.  Eyes: EOM and lids are normal. Pupils are equal, round, and reactive to light.  Neck: Normal range of motion. Neck supple. Carotid bruit is not present.  Cardiovascular: Normal rate, regular rhythm, normal heart sounds, intact distal pulses and normal pulses.  Pulmonary/Chest: No respiratory distress. He has decreased breath sounds. He has wheezes.  Wheeze during inhalation and expiration. Pt using assessory muscles.  Abdominal: Soft. Bowel sounds are normal. There is no tenderness. There is no guarding.  Musculoskeletal: Normal range of motion. He exhibits no edema.  Lymphadenopathy:       Head (right side): No  submandibular adenopathy present.       Head (left side): No submandibular adenopathy present.    He has no cervical adenopathy.  Neurological: He is alert and oriented to person, place, and time. He has normal strength. No cranial nerve deficit or sensory deficit.  Skin: Skin is warm and dry.  Psychiatric: He has a normal mood and affect. His speech is normal.  Nursing note and vitals reviewed.    ED Treatments / Results  Labs (all labs ordered are listed, but only abnormal results are displayed) Labs Reviewed - No data to display  EKG  EKG Interpretation None       Radiology Dg Chest 2 View  Result Date: 06/01/2017 CLINICAL DATA:  Shortness of breath, chest tightness, left-sided rib pain EXAM: CHEST  2 VIEW COMPARISON:  03/14/2017 FINDINGS: The lungs are hyperinflated likely secondary to COPD. There is no focal parenchymal opacity. There is no pleural effusion or pneumothorax. The heart and  mediastinal contours are unremarkable. The osseous structures are unremarkable. IMPRESSION: No active cardiopulmonary disease. Electronically Signed   By: Elige Ko   On: 06/01/2017 08:25    Procedures Procedures (including critical care time)  Medications Ordered in ED Medications - No data to display   Initial Impression / Assessment and Plan / ED Course  I have reviewed the triage vital signs and the nursing notes.  Pertinent labs & imaging results that were available during my care of the patient were reviewed by me and considered in my medical decision making (see chart for details).       Final Clinical Impressions(s) / ED Diagnoses   Recheck. Pt states he has not had his nebulizer tx yet. He is having itching related to the solumedrol. IV benadryl given. Pulse ox monitor closely.  Recheck.  Patient states he is breathing better after the nebulizer treatment.  He continues to have some soft wheezing present.  An additional nebulizer treatment will be given.  The itching  has resolved after the IV Benadryl.  Pulse oximetry is 91-93% on room air.  Patient tolerated the second nebulizer treatment without problem.  Recheck. patient no longer using accessory muscles.  He is now sitting up in the bed and operating his phone, in no distress.  Patient ambulated around the nursing station and hall twice without desaturation.  I have asked the patient to continue his medications by the pulmonologist.  I have invited him to return to the emergency department immediately if any changes, problems, or concerns.  Patient is in agreement with this plan.   Final diagnoses:  Moderate persistent asthma with exacerbation    ED Discharge Orders    None       Ivery Quale, PA-C 06/01/17 1305    Samuel Jester, DO 06/04/17 [phone]

## 2017-06-02 ENCOUNTER — Ambulatory Visit: Payer: BLUE CROSS/BLUE SHIELD | Admitting: Adult Health

## 2017-06-02 ENCOUNTER — Encounter: Payer: Self-pay | Admitting: Adult Health

## 2017-06-02 DIAGNOSIS — J4541 Moderate persistent asthma with (acute) exacerbation: Secondary | ICD-10-CM | POA: Diagnosis not present

## 2017-06-02 DIAGNOSIS — D721 Eosinophilia, unspecified: Secondary | ICD-10-CM

## 2017-06-02 MED ORDER — TIOTROPIUM BROMIDE MONOHYDRATE 2.5 MCG/ACT IN AERS: 2.0000 | INHALATION_SPRAY | Freq: Every day | RESPIRATORY_TRACT | 0 refills | Status: DC

## 2017-06-02 MED ORDER — FLUTICASONE FUROATE-VILANTEROL 200-25 MCG/INH IN AEPB: 1.0000 | INHALATION_SPRAY | Freq: Every day | RESPIRATORY_TRACT | 0 refills | Status: DC

## 2017-06-02 MED ORDER — PREDNISONE 10 MG PO TABS: ORAL_TABLET | ORAL | 0 refills | Status: DC

## 2017-06-02 MED ORDER — FLUTICASONE FUROATE-VILANTEROL 200-25 MCG/INH IN AEPB: 1.0000 | INHALATION_SPRAY | Freq: Every day | RESPIRATORY_TRACT | 12 refills | Status: DC

## 2017-06-02 NOTE — Patient Instructions (Signed)
Prednisone taper over next week.  Mucinex DM Twice daily  As needed  Cough/congestion  Zyrtec 10mg  At bedtime  Continue on BREO daily .  Add Spiriva 2 puffs daily.  Continue on Singulair  daily .  Follow up with Dr. [name]  In 4 weeks and As needed   Please contact office for sooner follow up if symptoms do not improve or worsen or seek emergency care

## 2017-06-02 NOTE — Assessment & Plan Note (Signed)
Add zyrtec daily  Awaiting cinquair

## 2017-06-02 NOTE — Addendum Note (Signed)
Addended by: Boone MasterJONES, Jaana Brodt E on: 06/02/2017 10:43 AM   Modules accepted: Orders

## 2017-06-02 NOTE — Assessment & Plan Note (Signed)
Recurrent exacerbation with severely high IgE /eosinophils  Awaiting Cinguair approval  Will tx with prednisone taper  Add Spirva and zyrtec   Plan  Patient Instructions  Prednisone taper over next week.  Mucinex DM Twice daily  As needed  Cough/congestion  Zyrtec 10mg  At bedtime  Continue on BREO daily .  Add Spiriva 2 puffs daily.  Continue on Singulair  daily .  Follow up with Dr. [name]  In 4 weeks and As needed   Please contact office for sooner follow up if symptoms do not improve or worsen or seek emergency care

## 2017-06-02 NOTE — Progress Notes (Signed)
Patient seen in the office today and instructed on use of Spiriva Respimat 2.305mcg.  Patient expressed understanding and demonstrated technique. Boone MasterJessica Sandoval, St. Agnes Medical CenterCMA 06/02/17

## 2017-06-02 NOTE — Progress Notes (Signed)
@Patient  ID: Drew Sandoval, male    DOB: 1975-06-19, 42 y.o.   MRN: [id number]  Chief Complaint  Patient presents with  . 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 FVC 3.52/68%, FEV1 1.80/43%, ratio or 0.51, FEF 25-75% 0.76/18% IgE was too high for Xolair. He could not afford Nucala or allergy vaccine  06/02/2017 Acute OV : Asthma /ER  Patient presents for an acute office visit.  Patient complains that he has had increased cough wheezing shortness of breath and tightness for 4 days .  He has been to the emergency room twice in the last 3 weeks.  He was seen there yesterday.  Given a Depo steroid shot.  And several neb treatments. CXR w/ no acute process. He has been using more albuterol . Feels slightly better today but still has tightness and wheezing .  Patient is on BREO and Singulair .   Has severe airflow obstruction .   Pt is currently awaiting approval for Ciquair.    Allergies  Allergen Reactions  . Azithromycin Rash  . Alvesco [Ciclesonide]     Possible reaction- rash  . Anoro Ellipta [Umeclidinium-Vilanterol]     Possible- rash  . Banana Swelling    Throat swelling   . Penicillins Rash    Has patient had a PCN reaction causing immediate rash, facial/tongue/throat swelling, SOB or lightheadedness with hypotension: Yes Has patient had a PCN reaction causing severe rash involving mucus membranes or skin necrosis: No Has patient had a PCN reaction that required hospitalization Yes Has  patient had a PCN reaction occurring within the last 10 years: No If all of the above answers are "NO", then may proceed with Cephalosporin use.      Immunization History  Administered Date(s) Administered  . Influenza Split 02/18/2011, 02/03/2012  . Influenza,inj,Quad PF,6+ Mos 01/22/2013, 07/24/2013, 06/25/2015, 03/11/2016, 01/25/2017  . Pneumococcal Polysaccharide-23 03/11/2016    Past Medical History:  Diagnosis Date  . Asthma   . Seasonal allergies     Tobacco History: Social History   Tobacco Use  Smoking Status Former Smoker  . Packs/day: 0.50  . Years: 11.50  . Pack years: 5.75  . Types: Cigarettes  . Last attempt to quit: 04/09/2010  . Years since quitting: 7.1  Smokeless Tobacco Former Neurosurgeon  Tobacco Comment   started at age 74.  1/2 ppd.     Counseling given: Not Answered Comment: started at age 63.  1/2 ppd.     Outpatient Encounter Medications as of 06/02/2017  Medication Sig  . albuterol (PROVENTIL HFA;VENTOLIN HFA) 108 (90 Base) MCG/ACT inhaler Inhale 2 puffs into the lungs every 4 (four) hours as needed.  Marland Kitchen albuterol (PROVENTIL) (2.5 MG/3ML) 0.083% nebulizer solution Take 3 mLs (2.5 mg total) by nebulization every 6 (six) hours as needed for wheezing or shortness of breath.  . EPINEPHrine 0.3 mg/0.3 mL IJ SOAJ injection Inject into thigh for severe allergic reaction (Patient taking differently: Inject 0.3 mg into the muscle once. Inject into thigh for severe allergic reaction)  . fluticasone furoate-vilanterol (BREO ELLIPTA) 200-25 MCG/INH AEPB Inhale 1  puff into the lungs daily. Rinse mouth  . montelukast (SINGULAIR) 10 MG tablet Take 1 tablet (10 mg total) by mouth at bedtime.  . predniSONE (DELTASONE) 10 MG tablet 4 tabs for 2 days, then 3 tabs for 2 days, 2 tabs for 2 days, then 1 tab for 2 days, then stop  . Tiotropium Bromide Monohydrate (SPIRIVA RESPIMAT) 2.5 MCG/ACT AERS Inhale 2 puffs into the lungs daily.   No facility-administered encounter  medications on file as of 06/02/2017.      Review of Systems  Constitutional:   No  weight loss, night sweats,  Fevers, chills, fatigue, or  lassitude.  HEENT:   No headaches,  Difficulty swallowing,  Tooth/dental problems, or  Sore throat,                No sneezing, itching, ear ache, + nasal congestion, post nasal drip,   CV:  No chest pain,  Orthopnea, PND, swelling in lower extremities, anasarca, dizziness, palpitations, syncope.   GI  No heartburn, indigestion, abdominal pain, nausea, vomiting, diarrhea, change in bowel habits, loss of appetite, bloody stools.   Resp:    No chest wall deformity  Skin: no rash or lesions.  GU: no dysuria, change in color of urine, no urgency or frequency.  No flank pain, no hematuria   MS:  No joint pain or swelling.  No decreased range of motion.  No back pain.    Physical Exam  BP 114/80 (BP Location: Left Arm, Cuff Size: Normal)   Pulse 87   Ht 6\' 3"  (1.905 m)   Wt 163 lb (73.9 kg)   SpO2 97%   BMI 20.37 kg/m   GEN: A/Ox3; pleasant , NAD thin male    HEENT:  [place]/AT,  EACs-clear, TMs-wnl, NOSE-clear, THROAT-clear, no lesions, no postnasal drip or exudate noted.   NECK:  Supple w/ fair ROM; no JVD; normal carotid impulses w/o bruits; no thyromegaly or nodules palpated; no lymphadenopathy.    RESP  Clear  P & A; w/o, wheezes/ rales/ or rhonchi. no accessory muscle use, no dullness to percussion Speaks in full sentences   CARD:  RRR, no m/r/g, no peripheral edema, pulses intact, no cyanosis or clubbing.  GI:   Soft & nt; nml bowel sounds; no organomegaly or masses detected.   Musco: Warm bil, no deformities or joint swelling noted.   Neuro: alert, no focal deficits noted.    Skin: Warm, no lesions or rashes    Lab Results:   BMET  BNP  ProBNP No results found for: PROBNP  Imaging: Dg Chest 2 View  Result Date: 06/01/2017 CLINICAL DATA:  Shortness of breath, chest tightness, left-sided rib pain EXAM: CHEST  2  VIEW COMPARISON:  03/14/2017 FINDINGS: The lungs are hyperinflated likely secondary to COPD. There is no focal parenchymal opacity. There is no pleural effusion or pneumothorax. The heart and mediastinal contours are unremarkable. The osseous structures are unremarkable. IMPRESSION: No active cardiopulmonary disease. Electronically Signed   By: Elige Ko   On: 06/01/2017 08:25     Assessment & Plan:   Asthma exacerbation Recurrent exacerbation with severely high IgE /eosinophils  Awaiting Cinguair approval  Will tx with prednisone taper  Add Spirva and zyrtec   Plan  Patient Instructions  Prednisone taper over next week.  Mucinex DM Twice daily  As needed  Cough/congestion  Zyrtec 10mg  At bedtime  Continue on BREO daily .  Add Spiriva 2 puffs daily.  Continue on Singulair  daily .  Follow up with Dr. [name]  In 4 weeks and As needed   Please contact office for sooner follow up if symptoms do not improve or worsen or seek emergency care       Allergic eosinophilia Add zyrtec daily  Awaiting Foster Simpsoncinquair      Trinten Boudoin, NP 06/02/2017

## 2017-06-13 ENCOUNTER — Telehealth: Payer: Self-pay | Admitting: Internal Medicine

## 2017-06-13 NOTE — Telephone Encounter (Signed)
Spoke with Inetta Fermoina. She was checking on the status of the patient being scheduled for his Cinquair infusions. Advised her that per his chart, I did not see where anything was scheduled.   Florentina AddisonKatie, can you look behind me to make sure I haven't missed anything? Thanks!

## 2017-06-14 NOTE — Telephone Encounter (Signed)
Spoke with Inetta Fermoina at [facility]-she is aware that patient is being started on Cinqair-however we had to complete a pre determination per [employer] and this will take up to 30 days. Inetta Fermoina will call back around 06-29-17. No need to keep phone note open.

## 2017-06-24 ENCOUNTER — Telehealth: Payer: Self-pay | Admitting: Internal Medicine

## 2017-06-24 MED ORDER — PREDNISONE 10 MG PO TABS: ORAL_TABLET | ORAL | 0 refills | Status: DC

## 2017-06-24 NOTE — Telephone Encounter (Signed)
Offer prednisone 10 mg, # 20, 4 X 2 DAYS, 3 X 2 DAYS, 2 X 2 DAYS, 1 X 2 DAYS  

## 2017-06-24 NOTE — Telephone Encounter (Signed)
Called and spoke with patient, patient states that he is having chest congestion, wheezing, and shortness of breath x 4 days now. Denies chest pain, fever, body aches or chills. Nebulizer treatments were working and now they are not.    Current Outpatient Medications on File Prior to Visit  Medication Sig Dispense Refill  . albuterol (PROVENTIL HFA;VENTOLIN HFA) 108 (90 Base) MCG/ACT inhaler Inhale 2 puffs into the lungs every 4 (four) hours as needed. 6.7 g 5  . albuterol (PROVENTIL) (2.5 MG/3ML) 0.083% nebulizer solution Take 3 mLs (2.5 mg total) by nebulization every 6 (six) hours as needed for wheezing or shortness of breath. 360 mL 5  . EPINEPHrine 0.3 mg/0.3 mL IJ SOAJ injection Inject into thigh for severe allergic reaction (Patient taking differently: Inject 0.3 mg into the muscle once. Inject into thigh for severe allergic reaction) 1 Device prn  . fluticasone furoate-vilanterol (BREO ELLIPTA) 200-25 MCG/INH AEPB Inhale 1 puff into the lungs daily. Rinse mouth 60 each 12  . fluticasone furoate-vilanterol (BREO ELLIPTA) 200-25 MCG/INH AEPB Inhale 1 puff into the lungs daily. 1 each 0  . montelukast (SINGULAIR) 10 MG tablet Take 1 tablet (10 mg total) by mouth at bedtime. 30 tablet 11  . predniSONE (DELTASONE) 10 MG tablet 4 tabs for 2 days, then 3 tabs for 2 days, 2 tabs for 2 days, then 1 tab for 2 days, then stop 20 tablet 0  . Tiotropium Bromide Monohydrate (SPIRIVA RESPIMAT) 2.5 MCG/ACT AERS Inhale 2 puffs into the lungs daily. 1 Inhaler 0   No current facility-administered medications on file prior to visit.    Allergies  Allergen Reactions  . Azithromycin Rash  . Alvesco [Ciclesonide]     Possible reaction- rash  . Anoro Ellipta [Umeclidinium-Vilanterol]     Possible- rash  . Banana Swelling    Throat swelling   . Penicillins Rash    Has patient had a PCN reaction causing immediate rash, facial/tongue/throat swelling, SOB or lightheadedness with hypotension: Yes Has patient  had a PCN reaction causing severe rash involving mucus membranes or skin necrosis: No Has patient had a PCN reaction that required hospitalization Yes Has patient had a PCN reaction occurring within the last 10 years: No If all of the above answers are "NO", then may proceed with Cephalosporin use.

## 2017-06-24 NOTE — Telephone Encounter (Signed)
Spoke with patient. He is aware of CY's recs. Will call medication into the pharmacy for him. Nothing else needed at time of call.

## 2017-06-28 ENCOUNTER — Telehealth: Payer: Self-pay | Admitting: Internal Medicine

## 2017-06-28 NOTE — Telephone Encounter (Signed)
Advised [employer] that we are still waiting response. It could take up to 30 days. Nothing further needed.

## 2017-07-01 ENCOUNTER — Encounter (HOSPITAL_COMMUNITY): Payer: Self-pay

## 2017-07-01 ENCOUNTER — Emergency Department (HOSPITAL_COMMUNITY)
Admission: EM | Admit: 2017-07-01 | Discharge: 2017-07-01 | Disposition: A | Discharge status: Home / Self Care | Payer: BLUE CROSS/BLUE SHIELD | Attending: Emergency Medicine | Admitting: Emergency Medicine

## 2017-07-01 DIAGNOSIS — J4550 Severe persistent asthma, uncomplicated: Secondary | ICD-10-CM | POA: Diagnosis not present

## 2017-07-01 DIAGNOSIS — L0201 Cutaneous abscess of face: Secondary | ICD-10-CM | POA: Diagnosis present

## 2017-07-01 DIAGNOSIS — Z87891 Personal history of nicotine dependence: Secondary | ICD-10-CM | POA: Diagnosis not present

## 2017-07-01 MED ORDER — TRAMADOL HCL 50 MG PO TABS
100.0000 mg | ORAL_TABLET | Freq: Once | ORAL | Status: AC
  Administered 2017-07-01: 100 mg via ORAL
  Filled 2017-07-01: qty 2

## 2017-07-01 MED ORDER — TRAMADOL HCL 50 MG PO TABS: 100.0000 mg | ORAL_TABLET | Freq: Four times a day (QID) | ORAL | 0 refills | Status: DC | PRN

## 2017-07-01 MED ORDER — ACETAMINOPHEN 325 MG PO TABS
650.0000 mg | ORAL_TABLET | Freq: Once | ORAL | Status: AC
  Administered 2017-07-01: 650 mg via ORAL
  Filled 2017-07-01: qty 2

## 2017-07-01 MED ORDER — LIDOCAINE-EPINEPHRINE (PF) 2 %-1:200000 IJ SOLN
10.0000 mL | Freq: Once | INTRAMUSCULAR | Status: AC
  Administered 2017-07-01: 10 mL
  Filled 2017-07-01: qty 20

## 2017-07-01 MED ORDER — DOXYCYCLINE HYCLATE 100 MG PO TABS
100.0000 mg | ORAL_TABLET | Freq: Once | ORAL | Status: AC
  Administered 2017-07-01: 100 mg via ORAL
  Filled 2017-07-01: qty 1

## 2017-07-01 MED ORDER — IBUPROFEN 400 MG PO TABS
600.0000 mg | ORAL_TABLET | Freq: Once | ORAL | Status: AC
  Administered 2017-07-01: 600 mg via ORAL
  Filled 2017-07-01: qty 2

## 2017-07-01 MED ORDER — DOXYCYCLINE HYCLATE 100 MG PO CAPS: 100.0000 mg | ORAL_CAPSULE | Freq: Two times a day (BID) | ORAL | 0 refills | Status: DC

## 2017-07-01 NOTE — ED Triage Notes (Signed)
Pt noticed the start of a bump on his chin on Tuesday, states has gotten bigger and red, states has had some drainage, now more painful

## 2017-07-01 NOTE — Discharge Instructions (Addendum)
Use heat over the swollen area. Take the tramadol with ibuprofen 600 mg and/or acetaminophen 650 mg every 6 hrs for pain. Take the antibiotic until gone.

## 2017-07-01 NOTE — ED Provider Notes (Signed)
[facility] EMERGENCY DEPARTMENT Provider Note   CSN: [id number] Arrival date & time: 07/01/17  0316  Time seen 06:20 AM    History   Chief Complaint Chief Complaint  Patient presents with  . Abscess    chin    HPI Drew Sandoval is a 42 y.o. male.  HPI patient states he noticed a knot on his chin February 19.  He states it did drain some pus some blood yesterday however it is still swollen and painful.  He states his teeth also started getting painful yesterday.  He denies any fever, he denies having abscesses or boils in the past.  PCP [name], MD   Past Medical History:  Diagnosis Date  . Asthma   . Seasonal allergies     Patient Active Problem List   Diagnosis Date Noted  . Hypokalemia 03/10/2016  . Hypoxia 10/28/2014  . Acute conjunctivitis of right eye 10/07/2014  . Sustained SVT (HCC) 09/24/2014  . SVT (supraventricular tachycardia) (HCC) 09/24/2014  . Malnutrition of moderate degree (HCC) 09/24/2014  . Underweight 09/24/2014  . Seasonal and perennial allergic rhinitis 07/17/2014  . Acute respiratory failure with hypoxia (HCC) 07/23/2013  . Asthma exacerbation 06/27/2013  . 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 Eczema, allergic 07/22/2011  . Status asthmaticus 10/19/2010  . Sinusitis, maxillary, 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  . 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 Asthma, severe persistent 07/16/2010    Past Surgical History:  Procedure Laterality Date  . None         Home Medications    Prior to Admission medications   Medication Sig Start Date End Date Taking? Authorizing Provider  albuterol (PROVENTIL HFA;VENTOLIN HFA) 108 (90 Base) MCG/ACT inhaler Inhale 2 puffs into the lungs every 4 (four) hours as needed. 05/27/17   Jetty Duhamel D, MD  albuterol (PROVENTIL) (2.5 MG/3ML) 0.083% nebulizer solution Take 3 mLs (2.5 mg total) by nebulization every 6 (six) hours as needed for wheezing or shortness of breath. 05/27/17   [name], MD    doxycycline (VIBRAMYCIN) 100 MG capsule Take 1 capsule (100 mg total) by mouth 2 (two) times daily. 07/01/17   [name], MD  EPINEPHrine 0.3 mg/0.3 mL IJ SOAJ injection Inject into thigh for severe allergic reaction Patient taking differently: Inject 0.3 mg into the muscle once. Inject into thigh for severe allergic reaction 04/29/15   Young, Joni Fears D, MD  fluticasone furoate-vilanterol (BREO ELLIPTA) 200-25 MCG/INH AEPB Inhale 1 puff into the lungs daily. Rinse mouth 06/02/17   Young, Joni Fears D, MD  fluticasone furoate-vilanterol (BREO ELLIPTA) 200-25 MCG/INH AEPB Inhale 1 puff into the lungs daily. 06/02/17   Parrett, Virgel Bouquet, NP  montelukast (SINGULAIR) 10 MG tablet Take 1 tablet (10 mg total) by mouth at bedtime. 01/31/17   [name], MD  predniSONE (DELTASONE) 10 MG tablet 4 tabs for 2 days, then 3 tabs for 2 days, 2 tabs for 2 days, then 1 tab for 2 days, then stop 06/02/17   Parrett, Tammy S, NP  predniSONE (DELTASONE) 10 MG tablet Take 4 tabs for 2 days, then 3 tabs for 2 days, 2 tabs for 2 days, then 1 tab for 2 days, then stop. 06/24/17   Jetty Duhamel D, MD  Tiotropium Bromide Monohydrate (SPIRIVA RESPIMAT) 2.5 MCG/ACT AERS Inhale 2 puffs into the lungs daily. 06/02/17   Parrett, Virgel Bouquet, NP  traMADol (ULTRAM) 50 MG tablet Take 2 tablets (100 mg total) by mouth every 6 (six) hours as needed. 07/01/17  [name], MD    Family History Family History  Problem Relation Age of Onset  . Emphysema Father   . Lung cancer Mother     Social History Social History   Tobacco Use  . Smoking status: Former Smoker    Packs/day: 0.50    Years: 11.50    Pack years: 5.75    Types: Cigarettes    Last attempt to quit: 04/09/2010    Years since quitting: 7.2  . Smokeless tobacco: Former Neurosurgeon  . Tobacco comment: started at age 42.  1/2 ppd.    Substance Use Topics  . Alcohol use: No  . Drug use: No  employed making chicken McNuggets   Allergies   Azithromycin; Alvesco [ciclesonide];  Anoro ellipta [umeclidinium-vilanterol]; Banana; and Penicillins   Review of Systems Review of Systems  All other systems reviewed and are negative.    Physical Exam Updated Vital Signs BP (!) 139/96   Pulse 70   Temp 98.6 F (37 C) (Oral)   Resp 16   Ht 6\' 2"  (1.88 m)   Wt 74.8 kg (165 lb)   SpO2 98%   BMI 21.18 kg/m   Vital signs normal    Physical Exam  Constitutional: He is oriented to person, place, and time.  Tall, thin  HENT:  Head: Normocephalic and atraumatic.  Right Ear: External ear normal.  Left Ear: External ear normal.  Nose: Nose normal.  Mouth/Throat: Oropharynx is clear and moist.    Patient has an area of swelling on the chin just to the right of midline that is approximately 2 x 3 cm in size with an area in the center that appears to be the spot where it had drained before.  When I percuss his teeth they are nontender, the gums around the teeth are not swollen or inflamed.  Eyes: Conjunctivae and EOM are normal. Pupils are equal, round, and reactive to light.  Neck: Normal range of motion.  Cardiovascular: Normal rate.  Pulmonary/Chest: He is in respiratory distress.  Musculoskeletal: Normal range of motion. He exhibits no deformity.  Neurological: He is alert and oriented to person, place, and time. No cranial nerve deficit.  Skin: Skin is warm and dry. He is not diaphoretic.  Psychiatric: He has a normal mood and affect. His behavior is normal. Thought content normal.  Nursing note and vitals reviewed.    ED Treatments / Results  Labs (all labs ordered are listed, but only abnormal results are displayed) Labs Reviewed - No data to display  EKG  EKG Interpretation None       Radiology No results found.  Procedures .Marland KitchenIncision and Drainage Date/Time: 07/01/2017 7:36 AM Performed by: [name], MD Authorized by: [name], MD   Consent:    Consent obtained:  Verbal   Consent given by:  Patient   Risks discussed:  Bleeding,  pain and incomplete drainage   Alternatives discussed:  No treatment Location:    Type:  Abscess   Size:  2x3   Location:  Head   Head location:  Face (chin) Pre-procedure details:    Skin preparation:  Betadine Anesthesia (see MAR for exact dosages):    Anesthesia method:  Local infiltration   Local anesthetic:  Lidocaine 2% WITH epi Procedure type:    Complexity:  Simple Procedure details:    Needle aspiration: no     Incision types:  Single straight   Scalpel blade:  11   Wound management:  Probed and deloculated  Drainage amount:  Scant   Wound treatment:  Wound left open Post-procedure details:    Patient tolerance of procedure:  Tolerated well, no immediate complications Comments:     I made the incision in the area where he had had prior drainage.  There was only a small amount of pus and some blood that came out.  When I explored the wound to break up loculations there felt to be a tract that went superiorly.  However there was no other pockets found.  I suspect a lot of the induration just may be subcutaneous cellulitis   (including critical care time)  Medications Ordered in ED Medications  doxycycline (VIBRA-TABS) tablet 100 mg (not administered)  traMADol (ULTRAM) tablet 100 mg (not administered)  lidocaine-EPINEPHrine (XYLOCAINE W/EPI) 2 %-1:200000 (PF) injection 10 mL (10 mLs Infiltration Given by Other 07/01/17 0709)  acetaminophen (TYLENOL) tablet 650 mg (650 mg Oral Given 07/01/17 0737)  ibuprofen (ADVIL,MOTRIN) tablet 600 mg (600 mg Oral Given 07/01/17 0737)     Initial Impression / Assessment and Plan / ED Course  I have reviewed the triage vital signs and the nursing notes.  Pertinent labs & imaging results that were available during my care of the patient were reviewed by me and considered in my medical decision making (see chart for details).    I discussed with the patient since it had already drained we could do antibiotics and topical treatment or  try to I&D at some more, patient states he wants to try the I&D again.  The I&D did not release pocket of pus.  He was started on doxycycline, he can use heat, he was given tramadol to take with ibuprofen and acetaminophen for pain.  He should return if he gets a high fever or it seems to be getting worse.  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   Final Clinical Impressions(s) / ED Diagnoses   Final diagnoses:  Acute abscess of face    ED Discharge Orders        Ordered    doxycycline (VIBRAMYCIN) 100 MG capsule  2 times daily     07/01/17 0741    traMADol (ULTRAM) 50 MG tablet  Every 6 hours PRN     07/01/17 0741    OTC ibuprofen and acetaminophen  Plan discharge  Devoria AlbeIva Onie Kasparek, MD, Concha PyoFACEP    Khalaya Mcgurn, MD 07/01/17 [phone]

## 2017-07-01 NOTE — ED Notes (Signed)
Pt presents with abscess on chin. Pt states it burst yesterday revealing a yellow/pus color with blood. Pt states the pain is traveling to his lower gum line in his mouth making it painful to chew food and eat. Diameter of abscess = apprx. 2in.

## 2017-07-08 ENCOUNTER — Telehealth: Payer: Self-pay | Admitting: Internal Medicine

## 2017-07-08 NOTE — Telephone Encounter (Signed)
KW have you received any correspondance on this PA?

## 2017-07-12 NOTE — Telephone Encounter (Signed)
I spoke with patient's Insurance yesterday-pre determination was approved and [facility] get set up with first infusion.   Paperwork placed at front to scan in Epic.

## 2017-07-21 ENCOUNTER — Encounter (HOSPITAL_COMMUNITY): Payer: Self-pay

## 2017-07-21 ENCOUNTER — Encounter (HOSPITAL_COMMUNITY)
Admission: RE | Admit: 2017-07-21 | Discharge: 2017-07-21 | Disposition: A | Discharge status: Home / Self Care | Payer: BLUE CROSS/BLUE SHIELD | Source: Ambulatory Visit | Attending: Internal Medicine | Admitting: Internal Medicine

## 2017-07-21 DIAGNOSIS — J4550 Severe persistent asthma, uncomplicated: Secondary | ICD-10-CM | POA: Insufficient documentation

## 2017-07-21 MED ORDER — SODIUM CHLORIDE 0.9 % IV SOLN
228.3000 mg | Freq: Once | INTRAVENOUS | Status: AC
  Administered 2017-07-21: 228.3 mg via INTRAVENOUS
  Filled 2017-07-21: qty 22.83

## 2017-07-21 MED ORDER — SODIUM CHLORIDE 0.9 % IV SOLN
INTRAVENOUS | Status: DC
  Administered 2017-07-21: 250 mL via INTRAVENOUS

## 2017-07-21 NOTE — Progress Notes (Signed)
Patient arrived today for his first Cinqair infusion. Literature given and discussed with patient , verbalized understanding and questions answered. Patient tolerated infusion well without any signs or symptoms of reaction. Appointment made for 08/18/2017.

## 2017-07-21 NOTE — Discharge Instructions (Signed)
Reslizumab injection °What is this medicine? °RESLIZUMAB (res li ZOO mab) is used to help treat severe asthma. It should be used in combination with other asthma treatments to help control severe asthma. This medicine is not used for an acute asthma attack. °This medicine may be used for other purposes; ask your health care provider or pharmacist if you have questions. °COMMON BRAND NAME(S): CINQAIR °What should I tell my health care provider before I take this medicine? °They need to know if you have any of these conditions: °-cancer or a history of cancer °-parasitic (helminth) infection °-an unusual or allergic reaction to reslizumab, other medicines, foods, dyes, or preservatives °-pregnant or trying to get pregnant °-breast-feeding °How should I use this medicine? °This medicine is for infusion into a vein. It is given by a health care professional in a hospital or clinic setting. °Talk to your pediatrician regarding the use of this medicine in children. Special care may be needed. °Overdosage: If you think you have taken too much of this medicine contact a poison control center or emergency room at once. °NOTE: This medicine is only for you. Do not share this medicine with others. °What if I miss a dose? °It is important not to miss your dose. Call your doctor or health care professional if you are unable to keep an appointment. °What may interact with this medicine? °Interactions are not expected. °This list may not describe all possible interactions. Give your health care provider a list of all the medicines, herbs, non-prescription drugs, or dietary supplements you use. Also tell them if you smoke, drink alcohol, or use illegal drugs. Some items may interact with your medicine. °What should I watch for while using this medicine? °Your condition will be monitored carefully each time you receive this medicine and for a short time after each infusion. °Tell your doctor or healthcare professional if your  symptoms do not start to get better. If your symptoms get worse or if you need your rescue asthma medication more often, call your doctor right away. °Do not stop taking your other asthma medicines unless your healthcare professional tells you to. °Talk to your doctor about your risk of cancer. You may be more at risk for certain types of cancers if you take this medicine. °What side effects may I notice from receiving this medicine? °Side effects that you should report to your doctor or health care professional as soon as possible: °-allergic reactions like skin rash, itching or hives, swelling of the face, lips, or tongue °-breathing problems °-signs and symptoms of infection like fever or chills; cough with mucus or phlegm °-signs and symptoms of low blood pressure like dizziness; feeling faint or lightheaded, falls; unusually weak or tired °Side effects that usually do not require medical attention (report to your doctor or health care professional if they continue or are bothersome): °-fatigue °-headache °-muscle cramps °-muscle pain °-pain, redness, or irritation at site where injected °-sore throat °This list may not describe all possible side effects. Call your doctor for medical advice about side effects. You may report side effects to FDA at 1-800-FDA-1088. °Where should I keep my medicine? °This drug is given in a hospital or clinic and will not be stored at home. °NOTE: This sheet is a summary. It may not cover all possible information. If you have questions about this medicine, talk to your doctor, pharmacist, or health care provider. °© 2018 [employer]vier/Gold Standard (2015-05-29 07:25:45) ° °

## 2017-07-22 ENCOUNTER — Telehealth: Payer: Self-pay | Admitting: Internal Medicine

## 2017-07-22 NOTE — Telephone Encounter (Signed)
Forwarding to Katie

## 2017-07-22 NOTE — Telephone Encounter (Signed)
Spoke with Morrie [facility]ldonAshley at [facility] and gave pre termination approval number and nothing more needed at this time.

## 2017-08-18 ENCOUNTER — Encounter (HOSPITAL_COMMUNITY)
Admission: RE | Admit: 2017-08-18 | Discharge: 2017-08-18 | Disposition: A | Discharge status: Home / Self Care | Payer: BLUE CROSS/BLUE SHIELD | Source: Ambulatory Visit | Attending: Internal Medicine | Admitting: Internal Medicine

## 2017-08-18 ENCOUNTER — Encounter (HOSPITAL_COMMUNITY): Payer: Self-pay

## 2017-08-18 DIAGNOSIS — J4550 Severe persistent asthma, uncomplicated: Secondary | ICD-10-CM | POA: Diagnosis not present

## 2017-08-18 MED ORDER — SODIUM CHLORIDE 0.9 % IV SOLN
INTRAVENOUS | Status: DC
  Administered 2017-08-18: 09:00:00 via INTRAVENOUS

## 2017-08-18 MED ORDER — SODIUM CHLORIDE 0.9 % IV SOLN
228.3000 mg | Freq: Once | INTRAVENOUS | Status: AC
  Administered 2017-08-18: 228.3 mg via INTRAVENOUS
  Filled 2017-08-18: qty 22.83

## 2017-09-15 ENCOUNTER — Encounter (HOSPITAL_COMMUNITY)
Admission: RE | Admit: 2017-09-15 | Discharge: 2017-09-15 | Disposition: A | Discharge status: Home / Self Care | Payer: BLUE CROSS/BLUE SHIELD | Source: Ambulatory Visit | Attending: Internal Medicine | Admitting: Internal Medicine

## 2017-09-15 ENCOUNTER — Encounter (HOSPITAL_COMMUNITY): Payer: Self-pay

## 2017-09-15 DIAGNOSIS — J4550 Severe persistent asthma, uncomplicated: Secondary | ICD-10-CM | POA: Diagnosis not present

## 2017-09-15 MED ORDER — SODIUM CHLORIDE 0.9 % IV SOLN
228.3000 mg | INTRAVENOUS | Status: DC
  Administered 2017-09-15: 228.3 mg via INTRAVENOUS
  Filled 2017-09-15: qty 22.83

## 2017-09-15 MED ORDER — SODIUM CHLORIDE 0.9 % IV SOLN
INTRAVENOUS | Status: DC
  Administered 2017-09-15: 09:00:00 via INTRAVENOUS

## 2017-09-27 ENCOUNTER — Encounter: Payer: Self-pay | Admitting: Internal Medicine

## 2017-09-27 ENCOUNTER — Ambulatory Visit: Payer: BLUE CROSS/BLUE SHIELD | Admitting: Internal Medicine

## 2017-09-27 DIAGNOSIS — J4550 Severe persistent asthma, uncomplicated: Secondary | ICD-10-CM | POA: Diagnosis not present

## 2017-09-27 MED ORDER — PREDNISONE 10 MG PO TABS: ORAL_TABLET | ORAL | 0 refills | Status: DC

## 2017-09-27 MED ORDER — FLUTICASONE-UMECLIDIN-VILANT 100-62.5-25 MCG/INH IN AEPB: 1.0000 | INHALATION_SPRAY | Freq: Every day | RESPIRATORY_TRACT | 12 refills | Status: DC

## 2017-09-27 MED ORDER — FLUTICASONE-UMECLIDIN-VILANT 100-62.5-25 MCG/INH IN AEPB: 1.0000 | INHALATION_SPRAY | Freq: Every day | RESPIRATORY_TRACT | 0 refills | Status: DC

## 2017-09-27 NOTE — Patient Instructions (Signed)
Sample and printed script for Trelegy inhaler (has 3 medicines)        Inhale 1 puff, then rinse mouth, once daily.  If you like it, then ok to get the script filled and use this instead of Breo.  Your pharmacist may be able to tell you if it would be cheaper to use Trelegy, or Breo plus Spiriva Respimat.  Continue Cinqair injections, and use your nebulizer if needed.

## 2017-09-27 NOTE — Assessment & Plan Note (Signed)
He recognizes improvement with Cinqair and will continue. Plan- Try Trelegy, since adding Spiriva to [facility] did help.. Script for prednisone taper to hold.

## 2017-09-27 NOTE — Progress Notes (Signed)
Patient ID: Drew Sandoval, male    DOB: 14-Oct-1975    MRN: [id number]  HPI M  former smoker followed for severe asthma/ COPD, chronic sinusitis, hyper-eosinophilia and hyper IgE, complicated by history SVT/cardioversion FENO 03/18/16- 100  High, indicates allergic asthma Office Spirometry 03/18/2016-severe obstructive airways disease FVC 2.62/51%, FEV1 1.30/31%, FEV1/FVC 0.50, FEF 25-75 0.61/14% Office Spirometry 01/25/17-severe obstructive airways disease. FVC 3.52/68%, FEV1 1.80/43%, ratio or 0.51, FEF 25-75% 0.76/18% IgE was too high for Xolair. He could not afford Nucala or allergy vaccine  ---------------------------------------------------- 05/27/17- 42 year old male former smoker followed for severe asthma/ COPD, chronic sinusitis, persistent eosinophilia, hyper IgE-too high for Xolair, history SVT/cardioversion ----Asthma: Pt has been seen at ED [facility] 2 times recently for flare ups. Pt was not able to start Cinquair-would like to try again-signed forms today.  Currently on prednisone therapy after ER visit 2 weeks ago. First exacerbation "woke up like that".  Second treated at ER as bronchitis with antibiotic, no testing. Feels near baseline today without cough, wheeze or phlegm.  Has had flu shot. CXR 03/14/17- IMPRESSION: Hyperinflation consistent with reactive airway disease. No pneumonia nor other acute cardiopulmonary abnormality.  09/27/2017- 42 year old male former smoker followed for severe asthma/ COPD, chronic sinusitis, persistent eosinophilia, hyper IgE-too high for Xolair, history SVT/cardioversion ----Asthma: Pt states he has noticed he has asthma attack either right before or about 1 week after having Cinqair infusion. Pt goes to UC in [place] when this happens. Deep cough-sometimes productive-clear to light green in color.  LOV in January for exacerbation- given pred taper and started Spiriva He never feels clear and stable and his breathing but says with Cinqair he is  only averaging one episode per month severe enough that he has to go to an urgent care.  He cannot go without his home medications.  He like to Spiriva sample.  We discussed these medications, comparing Breo plus Spiriva to Trelegy. CXR -06/01/2017-  No active cardiopulmonary disease.  ROS-see HPI  "+" = positive Constitutional:    weight loss, night sweats, fevers, chills, fatigue, lassitude. HEENT:    headaches, difficulty swallowing, tooth/dental problems, sore throat,       sneezing, itching, ear ache, +nasal congestion, +post nasal drip, snoring CV:    chest pain, orthopnea, PND, swelling in lower extremities, anasarca,                                                        dizziness, palpitations Resp:   +shortness of breath with exertion or at rest.                productive cough,  non-productive cough, coughing up of blood.              change in color of mucus.  wheezing.   Skin:    rash or lesions. GI:  No-   heartburn, indigestion, abdominal pain, nausea, vomiting,  GU: d. MS:   joint pain, stiffness, decreased range of motion, back pain. Neuro-     nothing unusual Psych:  change in mood or affect.  depression or anxiety.   memory loss.  Objective:   Physical Exam General- Alert, Oriented, Affect-reserved/ laconic, Distress-none acute;  Tall, thin Skin- +tatoo Lymphadenopathy- none Head- atraumatic            Eyes- Gross vision  intact, PERRLA, conjunctivae clear secretions            Ears- Hearing, canals normal            Nose- Clear, no-Septal dev, mucus, polyps, erosion, perforation             Throat- Mallampati II , mucosa clear , drainage- none, tonsils- atrophic Neck- flexible , trachea midline, no stridor , thyroid nl, carotid no bruit Chest - symmetrical excursion , unlabored           Heart/CV- RRR  , no murmur , no gallop  , no rub, nl s1 s2                           - JVD- none , edema- none, stasis changes- none, varices- none           Lung-   diminished,  unlabored,  Cough+ dry , dullness-none, rub- none,  Wheeze-none Abd- scaphoid  Br/ Gen/ Rectal- Not done, not indicated Extrem- cyanosis- none, clubbing, none, atrophy- none, strength- nl Neuro- +slight tremor

## 2017-10-12 ENCOUNTER — Telehealth: Payer: Self-pay | Admitting: Internal Medicine

## 2017-10-12 NOTE — Telephone Encounter (Signed)
Spoke with Drew Sandoval at [facility]. She has faxed me an infusion form and Dr. Maple Sandoval has signed this. It has been faxed back to Drew Sandoval at [phone]. Nothing further was needed.

## 2017-10-13 ENCOUNTER — Encounter (HOSPITAL_COMMUNITY)
Admission: RE | Admit: 2017-10-13 | Discharge: 2017-10-13 | Disposition: A | Discharge status: Home / Self Care | Payer: BLUE CROSS/BLUE SHIELD | Source: Ambulatory Visit | Attending: Internal Medicine | Admitting: Internal Medicine

## 2017-10-13 ENCOUNTER — Encounter (HOSPITAL_COMMUNITY): Payer: Self-pay

## 2017-10-13 DIAGNOSIS — J4550 Severe persistent asthma, uncomplicated: Secondary | ICD-10-CM | POA: Diagnosis present

## 2017-10-13 MED ORDER — SODIUM CHLORIDE 0.9 % IV SOLN
INTRAVENOUS | Status: DC
  Administered 2017-10-13: 09:00:00 via INTRAVENOUS

## 2017-10-13 MED ORDER — SODIUM CHLORIDE 0.9 % IV SOLN
228.3000 mg | Freq: Once | INTRAVENOUS | Status: AC
  Administered 2017-10-13: 228.3 mg via INTRAVENOUS
  Filled 2017-10-13: qty 22.83

## 2017-10-14 ENCOUNTER — Telehealth: Payer: Self-pay | Admitting: Internal Medicine

## 2017-10-14 MED ORDER — FLUTICASONE-UMECLIDIN-VILANT 100-62.5-25 MCG/INH IN AEPB: 1.0000 | INHALATION_SPRAY | Freq: Every day | RESPIRATORY_TRACT | 12 refills | Status: DC

## 2017-10-14 NOTE — Telephone Encounter (Signed)
Spoke with pt. He is needing a prescription for Trelegy sent to his pharmacy. Rx has been sent in. Nothing further was needed.

## 2017-10-26 ENCOUNTER — Emergency Department (HOSPITAL_COMMUNITY)
Admission: EM | Admit: 2017-10-26 | Discharge: 2017-10-26 | Disposition: A | Discharge status: Home / Self Care | Payer: BLUE CROSS/BLUE SHIELD | Attending: Emergency Medicine | Admitting: Emergency Medicine

## 2017-10-26 ENCOUNTER — Encounter (HOSPITAL_COMMUNITY): Payer: Self-pay | Admitting: Emergency Medicine

## 2017-10-26 ENCOUNTER — Other Ambulatory Visit: Payer: Self-pay

## 2017-10-26 DIAGNOSIS — L02411 Cutaneous abscess of right axilla: Secondary | ICD-10-CM | POA: Insufficient documentation

## 2017-10-26 DIAGNOSIS — Z79899 Other long term (current) drug therapy: Secondary | ICD-10-CM | POA: Diagnosis not present

## 2017-10-26 DIAGNOSIS — J45909 Unspecified asthma, uncomplicated: Secondary | ICD-10-CM | POA: Insufficient documentation

## 2017-10-26 DIAGNOSIS — Z87891 Personal history of nicotine dependence: Secondary | ICD-10-CM | POA: Diagnosis not present

## 2017-10-26 MED ORDER — DOXYCYCLINE HYCLATE 100 MG PO CAPS: 100.0000 mg | ORAL_CAPSULE | Freq: Two times a day (BID) | ORAL | 0 refills | Status: DC

## 2017-10-26 MED ORDER — LIDOCAINE HCL (PF) 2 % IJ SOLN: 5.0000 mL | Freq: Once | INTRAMUSCULAR | Status: DC

## 2017-10-26 MED ORDER — POVIDONE-IODINE 10 % EX SOLN
CUTANEOUS | Status: DC | PRN
  Filled 2017-10-26: qty 30

## 2017-10-26 MED ORDER — HYDROCODONE-ACETAMINOPHEN 5-325 MG PO TABS: ORAL_TABLET | ORAL | 0 refills | Status: DC

## 2017-10-26 MED ORDER — LIDOCAINE HCL (PF) 1 % IJ SOLN
INTRAMUSCULAR | Status: AC
  Filled 2017-10-26: qty 2

## 2017-10-26 NOTE — ED Provider Notes (Signed)
[facility] EMERGENCY DEPARTMENT Provider Note   CSN: [id number] Arrival date & time: 10/26/17  [id number]     History   Chief Complaint Chief Complaint  Patient presents with  . Abscess    HPI Drew Sandoval Sandoval is a 42 y.o. male.  HPI   Drew Sandoval Sandoval is a 42 y.o. male who presents to the Emergency Department complaining of painful boils to his right axilla.  Symptoms began 3 days ago.  He describes multiple bumps to his right axilla that have increased in size and severity since onset.  This is a recurrent problem for him.  He states that his friend tried squeezing them without improvement or significant drainage.  He denies changing soap or deodorant.  No fever, chills, swelling, redness, numbness or pain of the extremity.  Nothing makes the symptoms better   Past Medical History:  Diagnosis Date  . Asthma   . Seasonal allergies     Patient Active Problem List   Diagnosis Date Noted  . Hypokalemia 03/10/2016  . Hypoxia 10/28/2014  . Acute conjunctivitis of right eye 10/07/2014  . Sustained SVT (HCC) 09/24/2014  . SVT (supraventricular tachycardia) (HCC) 09/24/2014  . Malnutrition of moderate degree (HCC) 09/24/2014  . Underweight 09/24/2014  . Seasonal and perennial allergic rhinitis 07/17/2014  . Acute respiratory failure with hypoxia (HCC) 07/23/2013  . Asthma exacerbation 06/27/2013  . 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 Eczema, allergic 07/22/2011  . Status asthmaticus 10/19/2010  . Sinusitis, maxillary, 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  . 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 Asthma, severe persistent 07/16/2010    Past Surgical History:  Procedure Laterality Date  . None          Home Medications    Prior to Admission medications   Medication Sig Start Date End Date Taking? Authorizing Provider  albuterol (PROVENTIL HFA;VENTOLIN HFA) 108 (90 Base) MCG/ACT inhaler Inhale 2 puffs into the lungs every 4 (four) hours as needed. 05/27/17   Jetty DuhamelYoung, Clinton D, MD  albuterol  (PROVENTIL) (2.5 MG/3ML) 0.083% nebulizer solution Take 3 mLs (2.5 mg total) by nebulization every 6 (six) hours as needed for wheezing or shortness of breath. 05/27/17   Waymon BudgeYoung, Clinton D, MD  doxycycline (VIBRAMYCIN) 100 MG capsule Take 1 capsule (100 mg total) by mouth 2 (two) times daily. 07/01/17   Devoria AlbeKnapp, Iva, MD  EPINEPHrine 0.3 mg/0.3 mL IJ SOAJ injection Inject into thigh for severe allergic reaction Patient taking differently: Inject 0.3 mg into the muscle once. Inject into thigh for severe allergic reaction 04/29/15   Young, Joni Fearslinton D, MD  fluticasone furoate-vilanterol (BREO ELLIPTA) 200-25 MCG/INH AEPB Inhale 1 puff into the lungs daily. Rinse mouth 06/02/17   Young, Joni Fearslinton D, MD  fluticasone furoate-vilanterol (BREO ELLIPTA) 200-25 MCG/INH AEPB Inhale 1 puff into the lungs daily. 06/02/17   Parrett, Virgel Bouquetammy S, NP  Fluticasone-Umeclidin-Vilant (TRELEGY ELLIPTA) 100-62.5-25 MCG/INH AEPB Inhale 1 puff into the lungs daily. Rinse mouth 10/14/17   Young, Joni Fearslinton D, MD  montelukast (SINGULAIR) 10 MG tablet Take 1 tablet (10 mg total) by mouth at bedtime. 01/31/17   Jetty DuhamelYoung, Clinton D, MD  predniSONE (DELTASONE) 10 MG tablet Take 4 tabs for 2 days, then 3 tabs for 2 days, 2 tabs for 2 days, then 1 tab for 2 days, then stop. 06/24/17   Waymon BudgeYoung, Clinton D, MD  predniSONE (DELTASONE) 10 MG tablet 4 tabs for 2 days, then 3 tabs for 2 days, 2 tabs for 2 days, then 1 tab for 2 days, then stop 09/27/17  Young, Joni Fears D, MD  Reslizumab [facility] IV) Inject 228.3 mg into the vein every 30 (thirty) days.    [provider]  Tiotropium Bromide Monohydrate (SPIRIVA RESPIMAT) 2.5 MCG/ACT AERS Inhale 2 puffs into the lungs daily. 06/02/17   Parrett, Virgel Bouquet, NP  traMADol (ULTRAM) 50 MG tablet Take 2 tablets (100 mg total) by mouth every 6 (six) hours as needed. 07/01/17   [name], MD    Family History Family History  Problem Relation Age of Onset  . Emphysema Father   . Lung cancer Mother     Social  History Social History   Tobacco Use  . Smoking status: Former Smoker    Packs/day: 0.50    Years: 11.50    Pack years: 5.75    Types: Cigarettes    Last attempt to quit: 04/09/2010    Years since quitting: 7.5  . Smokeless tobacco: Former Neurosurgeon  . Tobacco comment: started at age 53.  1/2 ppd.    Substance Use Topics  . Alcohol use: No  . Drug use: No     Allergies   Azithromycin; Alvesco [ciclesonide]; Anoro ellipta [umeclidinium-vilanterol]; Banana; and Penicillins   Review of Systems Review of Systems  Constitutional: Negative for chills and fever.  Gastrointestinal: Negative for nausea and vomiting.  Musculoskeletal: Negative for arthralgias and joint swelling.  Skin: Negative for color change.       Multiple boils to right axilla  Hematological: Negative for adenopathy.  All other systems reviewed and are negative.    Physical Exam Updated Vital Signs BP (!) 159/80 (BP Location: Left Arm)   Pulse (!) 58   Temp 97.6 F (36.4 C) (Oral)   Resp 18   Ht 6\' 2"  (1.88 m)   Wt 74.8 kg (165 lb)   SpO2 93%   BMI 21.18 kg/m   Physical Exam  Constitutional: He is oriented to person, place, and time. He appears well-developed and well-nourished. No distress.  HENT:  Head: Normocephalic and atraumatic.  Cardiovascular: Normal rate, regular rhythm and intact distal pulses.  No murmur heard. Pulmonary/Chest: Effort normal and breath sounds normal. No respiratory distress.  Musculoskeletal: Normal range of motion.  Neurological: He is alert and oriented to person, place, and time. He exhibits normal muscle tone. Coordination normal.  Skin: Skin is warm and dry. Capillary refill takes less than 2 seconds. There is erythema.  1 cm area of induration to the right axilla with a central pustule and several small satellite pustules of the same area.  No surrounding erythema or edema  Nursing note and vitals reviewed.    ED Treatments / Results  Labs (all labs ordered are  listed, but only abnormal results are displayed) Labs Reviewed - No data to display  EKG None  Radiology No results found.  Procedures Procedures (including critical care time)  INCISION AND DRAINAGE Performed by: Maxwell Caul. Consent: Verbal consent obtained. Risks and benefits: risks, benefits and alternatives were discussed Type: abscess  Body area: right axilla  Anesthesia: local infiltration  Incision was made with a #11 scalpel.  Local anesthetic: lidocaine 1% w/o epinephrine  Anesthetic total: 3 ml  Complexity: complex Blunt dissection to break up loculations  Drainage: purulent  Drainage amount: small  Packing material: none  Patient tolerance: Patient tolerated the procedure well with no immediate complications.   Medications Ordered in ED Medications  lidocaine (XYLOCAINE) 2 % injection 5 mL (has no administration in time range)  povidone-iodine (BETADINE) 10 % external solution (has  no administration in time range)     Initial Impression / Assessment and Plan / ED Course  I have reviewed the triage vital signs and the nursing notes.  Pertinent labs & imaging results that were available during my care of the patient were reviewed by me and considered in my medical decision making (see chart for details).     Pt well appearing. Pt with mostly pustules to the right axilla.  Hx of previous abscess.  Agrees to warm water soaks and rx for doxy.  Return precautions discussed  Final Clinical Impressions(s) / ED Diagnoses   Final diagnoses:  Abscess of axilla, right    ED Discharge Orders    None       Pauline Aus, PA-C 10/26/17 1002    Bethann Berkshire, MD 10/26/17 1527

## 2017-10-26 NOTE — ED Notes (Signed)
Suture tray set up In the room

## 2017-10-26 NOTE — ED Triage Notes (Signed)
Onset 3 days ago right axilla multiple boils, red, swollen, white pus present.

## 2017-10-26 NOTE — Discharge Instructions (Addendum)
Warm water soaks or compresses 2-3 times a day.  Take the antibiotic as directed until its finished.  Return here if needed

## 2017-10-26 NOTE — ED Notes (Signed)
Dressing applied, Patient given discharge instruction, verbalized understand. Patient ambulatory out of the department.  

## 2017-11-14 ENCOUNTER — Encounter (HOSPITAL_COMMUNITY): Admission: RE | Admit: 2017-11-14 | Payer: BLUE CROSS/BLUE SHIELD | Source: Ambulatory Visit

## 2017-11-17 ENCOUNTER — Encounter (HOSPITAL_COMMUNITY)
Admission: RE | Admit: 2017-11-17 | Discharge: 2017-11-17 | Disposition: A | Discharge status: Home / Self Care | Payer: BLUE CROSS/BLUE SHIELD | Source: Ambulatory Visit | Attending: Internal Medicine | Admitting: Internal Medicine

## 2017-11-17 DIAGNOSIS — J4550 Severe persistent asthma, uncomplicated: Secondary | ICD-10-CM | POA: Insufficient documentation

## 2017-11-17 MED ORDER — SODIUM CHLORIDE 0.9 % IV SOLN
3.0000 mg/kg | Freq: Once | INTRAVENOUS | Status: AC
  Administered 2017-11-17: 220 mg via INTRAVENOUS
  Filled 2017-11-17: qty 22

## 2017-11-17 MED ORDER — SODIUM CHLORIDE 0.9 % IV SOLN
INTRAVENOUS | Status: DC
  Administered 2017-11-17: 09:00:00 via INTRAVENOUS

## 2017-11-17 NOTE — Discharge Instructions (Signed)
Reslizumab injection °What is this medicine? °RESLIZUMAB (res li ZOO mab) is used to help treat severe asthma. It should be used in combination with other asthma treatments to help control severe asthma. This medicine is not used for an acute asthma attack. °This medicine may be used for other purposes; ask your health care provider or pharmacist if you have questions. °COMMON BRAND NAME(S): CINQAIR °What should I tell my health care provider before I take this medicine? °They need to know if you have any of these conditions: °-cancer or a history of cancer °-parasitic (helminth) infection °-an unusual or allergic reaction to reslizumab, other medicines, foods, dyes, or preservatives °-pregnant or trying to get pregnant °-breast-feeding °How should I use this medicine? °This medicine is for infusion into a vein. It is given by a health care professional in a hospital or clinic setting. °Talk to your pediatrician regarding the use of this medicine in children. Special care may be needed. °Overdosage: If you think you have taken too much of this medicine contact a poison control center or emergency room at once. °NOTE: This medicine is only for you. Do not share this medicine with others. °What if I miss a dose? °It is important not to miss your dose. Call your doctor or health care professional if you are unable to keep an appointment. °What may interact with this medicine? °Interactions are not expected. °This list may not describe all possible interactions. Give your health care provider a list of all the medicines, herbs, non-prescription drugs, or dietary supplements you use. Also tell them if you smoke, drink alcohol, or use illegal drugs. Some items may interact with your medicine. °What should I watch for while using this medicine? °Your condition will be monitored carefully each time you receive this medicine and for a short time after each infusion. °Tell your doctor or healthcare professional if your  symptoms do not start to get better. If your symptoms get worse or if you need your rescue asthma medication more often, call your doctor right away. °Do not stop taking your other asthma medicines unless your healthcare professional tells you to. °Talk to your doctor about your risk of cancer. You may be more at risk for certain types of cancers if you take this medicine. °What side effects may I notice from receiving this medicine? °Side effects that you should report to your doctor or health care professional as soon as possible: °-allergic reactions like skin rash, itching or hives, swelling of the face, lips, or tongue °-breathing problems °-signs and symptoms of infection like fever or chills; cough with mucus or phlegm °-signs and symptoms of low blood pressure like dizziness; feeling faint or lightheaded, falls; unusually weak or tired °Side effects that usually do not require medical attention (report to your doctor or health care professional if they continue or are bothersome): °-fatigue °-headache °-muscle cramps °-muscle pain °-pain, redness, or irritation at site where injected °-sore throat °This list may not describe all possible side effects. Call your doctor for medical advice about side effects. You may report side effects to FDA at 1-800-FDA-1088. °Where should I keep my medicine? °This drug is given in a hospital or clinic and will not be stored at home. °NOTE: This sheet is a summary. It may not cover all possible information. If you have questions about this medicine, talk to your doctor, pharmacist, or health care provider. °© 2018 [employer]vier/Gold Standard (2015-05-29 07:25:45) ° °

## 2017-12-15 ENCOUNTER — Emergency Department (HOSPITAL_COMMUNITY): Payer: BLUE CROSS/BLUE SHIELD

## 2017-12-15 ENCOUNTER — Encounter (HOSPITAL_COMMUNITY)
Admission: RE | Admit: 2017-12-15 | Discharge: 2017-12-15 | Disposition: A | Discharge status: Home / Self Care | Payer: BLUE CROSS/BLUE SHIELD | Source: Ambulatory Visit | Attending: Internal Medicine | Admitting: Internal Medicine

## 2017-12-15 ENCOUNTER — Emergency Department (HOSPITAL_COMMUNITY)
Admission: EM | Admit: 2017-12-15 | Discharge: 2017-12-15 | Disposition: A | Discharge status: Home / Self Care | Payer: BLUE CROSS/BLUE SHIELD | Attending: Emergency Medicine | Admitting: Emergency Medicine

## 2017-12-15 ENCOUNTER — Encounter (HOSPITAL_COMMUNITY): Payer: Self-pay | Admitting: Emergency Medicine

## 2017-12-15 ENCOUNTER — Other Ambulatory Visit: Payer: Self-pay

## 2017-12-15 ENCOUNTER — Encounter (HOSPITAL_COMMUNITY): Payer: Self-pay

## 2017-12-15 DIAGNOSIS — N201 Calculus of ureter: Secondary | ICD-10-CM | POA: Diagnosis not present

## 2017-12-15 DIAGNOSIS — J45909 Unspecified asthma, uncomplicated: Secondary | ICD-10-CM | POA: Diagnosis not present

## 2017-12-15 DIAGNOSIS — Z79899 Other long term (current) drug therapy: Secondary | ICD-10-CM | POA: Insufficient documentation

## 2017-12-15 DIAGNOSIS — Z87891 Personal history of nicotine dependence: Secondary | ICD-10-CM | POA: Diagnosis not present

## 2017-12-15 DIAGNOSIS — J4550 Severe persistent asthma, uncomplicated: Secondary | ICD-10-CM | POA: Diagnosis present

## 2017-12-15 DIAGNOSIS — R319 Hematuria, unspecified: Secondary | ICD-10-CM | POA: Diagnosis present

## 2017-12-15 DIAGNOSIS — J189 Pneumonia, unspecified organism: Secondary | ICD-10-CM | POA: Diagnosis not present

## 2017-12-15 LAB — URINALYSIS, ROUTINE W REFLEX MICROSCOPIC
BACTERIA UA: NONE SEEN
BILIRUBIN URINE: NEGATIVE
Glucose, UA: NEGATIVE mg/dL
KETONES UR: NEGATIVE mg/dL
LEUKOCYTES UA: NEGATIVE
NITRITE: NEGATIVE
PROTEIN: 30 mg/dL — AB
Specific Gravity, Urine: 1.02 (ref 1.005–1.030)
pH: 6 (ref 5.0–8.0)

## 2017-12-15 LAB — CBC WITH DIFFERENTIAL/PLATELET
BASOS ABS: 0.1 10*3/uL (ref 0.0–0.1)
BASOS PCT: 1 %
Eosinophils Absolute: 0.1 10*3/uL (ref 0.0–0.7)
Eosinophils Relative: 3 %
HEMATOCRIT: 41.5 % (ref 39.0–52.0)
HEMOGLOBIN: 13.1 g/dL (ref 13.0–17.0)
LYMPHS PCT: 41 %
Lymphs Abs: 2 10*3/uL (ref 0.7–4.0)
MCH: 29.2 pg (ref 26.0–34.0)
MCHC: 31.6 g/dL (ref 30.0–36.0)
MCV: 92.4 fL (ref 78.0–100.0)
MONO ABS: 0.7 10*3/uL (ref 0.1–1.0)
MONOS PCT: 13 %
NEUTROS ABS: 2.2 10*3/uL (ref 1.7–7.7)
NEUTROS PCT: 42 %
Platelets: 168 10*3/uL (ref 150–400)
RBC: 4.49 MIL/uL (ref 4.22–5.81)
RDW: 14.2 % (ref 11.5–15.5)
WBC: 5 10*3/uL (ref 4.0–10.5)

## 2017-12-15 LAB — BASIC METABOLIC PANEL
ANION GAP: 7 (ref 5–15)
BUN: 17 mg/dL (ref 6–20)
CALCIUM: 8.5 mg/dL — AB (ref 8.9–10.3)
CO2: 24 mmol/L (ref 22–32)
Chloride: 110 mmol/L (ref 98–111)
Creatinine, Ser: 0.87 mg/dL (ref 0.61–1.24)
GFR calc non Af Amer: >60 mL/min (ref >60)
GLUCOSE: 71 mg/dL (ref 70–99)
POTASSIUM: 3.5 mmol/L (ref 3.5–5.1)
Sodium: 141 mmol/L (ref 135–145)

## 2017-12-15 MED ORDER — KETOROLAC TROMETHAMINE 30 MG/ML IJ SOLN
30.0000 mg | Freq: Once | INTRAMUSCULAR | Status: AC
  Administered 2017-12-15: 30 mg via INTRAVENOUS
  Filled 2017-12-15: qty 1

## 2017-12-15 MED ORDER — DOXYCYCLINE HYCLATE 100 MG PO CAPS: 100.0000 mg | ORAL_CAPSULE | Freq: Two times a day (BID) | ORAL | 0 refills | Status: DC

## 2017-12-15 MED ORDER — IBUPROFEN 800 MG PO TABS: 800.0000 mg | ORAL_TABLET | Freq: Three times a day (TID) | ORAL | 0 refills | Status: DC | PRN

## 2017-12-15 MED ORDER — SODIUM CHLORIDE 0.9 % IV SOLN
228.3000 mg | Freq: Once | INTRAVENOUS | Status: AC
  Administered 2017-12-15: 228.3 mg via INTRAVENOUS
  Filled 2017-12-15: qty 22.83

## 2017-12-15 MED ORDER — SODIUM CHLORIDE 0.9 % IV SOLN
Freq: Once | INTRAVENOUS | Status: AC
  Administered 2017-12-15: 09:00:00 via INTRAVENOUS

## 2017-12-15 NOTE — Progress Notes (Signed)
Patient states having a UTI, which he is going to have treated today.  Talked to Viviann SpareSteven in the Pharmacy, who advised that it was ok to proceed with infusion.

## 2017-12-15 NOTE — ED Provider Notes (Signed)
[facility] EMERGENCY DEPARTMENT Provider Note   CSN: [id number] Arrival date & time: 12/15/17  1411     History   Chief Complaint Chief Complaint  Patient presents with  . Hematuria    HPI Drew Sandoval is a 42 y.o. male.  He is here for evaluation of hematuria with every episode of urination since Monday.  He states he is also had some vague lower abdominal discomfort.  There is no particular pain with urination, which is different than when he had a kidney stone.  No flank pain no nausea no vomiting no fevers no cough.  He is tried nothing for it.  Is not on any blood thinners.  He denies any penile discharge or sores and does not feel he would be at risk for any type of sexual transmitted disease.  The history is provided by the patient.  Hematuria  This is a new problem. The current episode started more than 2 days ago. The problem occurs constantly. The problem has not changed since onset.Associated symptoms include abdominal pain. Pertinent negatives include no chest pain, no headaches and no shortness of breath. Nothing aggravates the symptoms. Nothing relieves the symptoms. He has tried nothing for the symptoms. The treatment provided no relief.    Past Medical History:  Diagnosis Date  . Asthma   . Seasonal allergies     Patient Active Problem List   Diagnosis Date Noted  . Hypokalemia 03/10/2016  . Hypoxia 10/28/2014  . Acute conjunctivitis of right eye 10/07/2014  . Sustained SVT (HCC) 09/24/2014  . SVT (supraventricular tachycardia) (HCC) 09/24/2014  . Malnutrition of moderate degree (HCC) 09/24/2014  . Underweight 09/24/2014  . Seasonal and perennial allergic rhinitis 07/17/2014  . Acute respiratory failure with hypoxia (HCC) 07/23/2013  . Asthma exacerbation 06/27/2013  . 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 Eczema, allergic 07/22/2011  . Status asthmaticus 10/19/2010  . Sinusitis, maxillary, 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  . 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  Asthma, severe persistent 07/16/2010    Past Surgical History:  Procedure Laterality Date  . None          Home Medications    Prior to Admission medications   Medication Sig Start Date End Date Taking? Authorizing Provider  albuterol (PROVENTIL HFA;VENTOLIN HFA) 108 (90 Base) MCG/ACT inhaler Inhale 2 puffs into the lungs every 4 (four) hours as needed. 05/27/17   Jetty Duhamel D, MD  albuterol (PROVENTIL) (2.5 MG/3ML) 0.083% nebulizer solution Take 3 mLs (2.5 mg total) by nebulization every 6 (six) hours as needed for wheezing or shortness of breath. 05/27/17   [name], MD  doxycycline (VIBRAMYCIN) 100 MG capsule Take 1 capsule (100 mg total) by mouth 2 (two) times daily. 10/26/17   Triplett, Tammy, PA-C  EPINEPHrine 0.3 mg/0.3 mL IJ SOAJ injection Inject into thigh for severe allergic reaction Patient taking differently: Inject 0.3 mg into the muscle once. Inject into thigh for severe allergic reaction 04/29/15   Young, Joni Fears D, MD  fluticasone furoate-vilanterol (BREO ELLIPTA) 200-25 MCG/INH AEPB Inhale 1 puff into the lungs daily. Rinse mouth 06/02/17   Young, Joni Fears D, MD  fluticasone furoate-vilanterol (BREO ELLIPTA) 200-25 MCG/INH AEPB Inhale 1 puff into the lungs daily. 06/02/17   Parrett, Virgel Bouquet, NP  Fluticasone-Umeclidin-Vilant (TRELEGY ELLIPTA) 100-62.5-25 MCG/INH AEPB Inhale 1 puff into the lungs daily. Rinse mouth 10/14/17   Jetty Duhamel D, MD  HYDROcodone-acetaminophen (NORCO/VICODIN) 5-325 MG tablet Take one tab po q 4 hrs prn pain 10/26/17   Pauline Aus, PA-C  montelukast (SINGULAIR) 10 MG tablet Take 1 tablet (10 mg total) by mouth at bedtime. 01/31/17   Jetty Duhamel D, MD  predniSONE (DELTASONE) 10 MG tablet Take 4 tabs for 2 days, then 3 tabs for 2 days, 2 tabs for 2 days, then 1 tab for 2 days, then stop. 06/24/17   [name], MD  predniSONE (DELTASONE) 10 MG tablet 4 tabs for 2 days, then 3 tabs for 2 days, 2 tabs for 2 days, then 1 tab for 2 days,  then stop 09/27/17   Jetty Duhamel D, MD  Reslizumab (CINQAIR IV) Inject 228.3 mg into the vein every 30 (thirty) days.    [provider]  Tiotropium Bromide Monohydrate (SPIRIVA RESPIMAT) 2.5 MCG/ACT AERS Inhale 2 puffs into the lungs daily. 06/02/17   Parrett, Virgel Bouquet, NP  traMADol (ULTRAM) 50 MG tablet Take 2 tablets (100 mg total) by mouth every 6 (six) hours as needed. 07/01/17   [name], MD    Family History Family History  Problem Relation Age of Onset  . Emphysema Father   . Lung cancer Mother     Social History Social History   Tobacco Use  . Smoking status: Former Smoker    Packs/day: 0.50    Years: 11.50    Pack years: 5.75    Types: Cigarettes    Last attempt to quit: 04/09/2010    Years since quitting: 7.6  . Smokeless tobacco: Former Neurosurgeon  . Tobacco comment: started at age 62.  1/2 ppd.    Substance Use Topics  . Alcohol use: No  . Drug use: No     Allergies   Azithromycin; Alvesco [ciclesonide]; Anoro ellipta [umeclidinium-vilanterol]; Banana; and Penicillins   Review of Systems Review of Systems  Constitutional: Negative for fever.  HENT: Negative for sore throat.   Eyes: Negative for visual disturbance.  Respiratory: Negative for shortness of breath.   Cardiovascular: Negative for chest pain.  Gastrointestinal: Positive for abdominal pain.  Genitourinary: Positive for hematuria. Negative for discharge, dysuria, genital sores, penile pain, scrotal swelling and testicular pain.  Musculoskeletal: Negative for back pain.  Skin: Negative for rash.  Neurological: Negative for headaches.     Physical Exam Updated Vital Signs BP (!) 147/61 (BP Location: Right Arm)   Pulse (!) 52   Temp 98 F (36.7 C) (Oral)   Resp 18   Ht 6\' 2"  (1.88 m)   Wt 75.3 kg   SpO2 100%   BMI 21.31 kg/m   Physical Exam  Constitutional: He appears well-developed and well-nourished.  HENT:  Head: Normocephalic and atraumatic.  Eyes: Conjunctivae are normal.    Neck: Neck supple.  Cardiovascular: Normal rate, regular rhythm, normal heart sounds and intact distal pulses.  Pulmonary/Chest: Effort normal and breath sounds normal. He has no wheezes. He has no rales.  Abdominal: Soft. He exhibits no mass. There is no tenderness. There is no guarding.  Musculoskeletal: Normal range of motion. He exhibits no tenderness.  Neurological: He is alert. Gait normal. GCS eye subscore is 4. GCS verbal subscore is 5. GCS motor subscore is 6.  Skin: Skin is warm and dry.  Psychiatric: He has a normal mood and affect.  Nursing note and vitals reviewed.    ED Treatments / Results  Labs (all labs ordered are listed, but only abnormal results are displayed) Labs Reviewed  URINALYSIS, ROUTINE W REFLEX MICROSCOPIC - Abnormal; Notable for the following components:      Result Value   Color, Urine  AMBER (*)    APPearance CLOUDY (*)    Hgb urine dipstick LARGE (*)    Protein, ur 30 (*)    RBC / HPF >50 (*)    All other components within normal limits  BASIC METABOLIC PANEL - Abnormal; Notable for the following components:   Calcium 8.5 (*)    All other components within normal limits  CBC WITH DIFFERENTIAL/PLATELET    EKG None  Radiology Ct Renal Stone Study  Result Date: 12/15/2017 CLINICAL DATA:  Hematuria EXAM: CT ABDOMEN AND PELVIS WITHOUT CONTRAST TECHNIQUE: Multidetector CT imaging of the abdomen and pelvis was performed following the standard protocol without oral or IV contrast. COMPARISON:  June 24, 2011 FINDINGS: Lower chest: There is patchy airspace consolidation in the right middle lobe with volume loss. There is underlying emphysematous change with scarring in the bases. Hepatobiliary: No focal liver lesions are evident on this noncontrast enhanced study. Gallbladder wall is not appreciably thickened. Gallbladder is mildly contracted. There is no biliary duct dilatation. Pancreas: No pancreatic mass or inflammatory focus. Spleen: No splenic  lesions are evident. Adrenals/Urinary Tract: Adrenals bilaterally appear normal. There is nephrocalcinosis bilaterally. No renal masses evident on either side. There is mild to moderate hydronephrosis on the right. No hydronephrosis is evident on the left. There is a 3 x 2 mm calculus at the right ureterovesical junction no other ureteral calculi are evident. There are several pelvic phleboliths which are near but separate from the distal ureters. Urinary bladder is midline. Urinary bladder wall is borderline thickened for degree of distention. Stomach/Bowel: There is no appreciable bowel wall or mesenteric thickening. No evident bowel obstruction. There is moderate stool throughout the colon. No free air or portal venous air evident. Vascular/Lymphatic: There is no abdominal aortic aneurysm. No vascular lesions are evident on this noncontrast enhanced study. There is no evident adenopathy in the abdomen or pelvis. There are a few subcentimeter inguinal lymph nodes, regarded as nonspecific. Reproductive: Prostate and seminal vesicles appear normal in size and contour. No evident pelvic mass. Other: Appendix appears normal. There is no ascites or abscess in the abdomen or pelvis. Musculoskeletal: There is a stable bone island in the lateral left ischium. There are no lytic or destructive bone lesions. There is no intramuscular or abdominal wall lesion. IMPRESSION: 1. 3 x 2 mm calculus at the right ureterovesical junction causing hydronephrosis on the right. 2. Right middle lobe pneumonia with right middle lobe volume loss. There is a degree of emphysematous change in the bases. 3.  Nephrocalcinosis bilaterally. 4. Urinary bladder borderline thickened. Question a degree of cystitis. Advise correlation with urinalysis in this regard. 5. No evident bowel obstruction. No abscess in the abdomen or pelvis. Appendix appears normal. Emphysema (ICD10-J43.9). Electronically Signed   By: Bretta BangWilliam  Woodruff III M.D.   On:  12/15/2017 19:43    Procedures Procedures (including critical care time)  Medications Ordered in ED Medications - No data to display   Initial Impression / Assessment and Plan / ED Course  I have reviewed the triage vital signs and the nursing notes.  Pertinent labs & imaging results that were available during my care of the patient were reviewed by me and considered in my medical decision making (see chart for details).  Clinical Course as of Dec 17 955  Thu Dec 15, 2017  1959 CT showed a 2 or 3 mm stone just above the bladder.  That would account for the patient's hematuria and suprapubic pain.  They also mentioned  that he is got a right lower lobe pneumonia which the patient has no symptoms of.  We reviewed this with him and he would rather start on some antibiotics just in case.  Says he usually gets on doxycycline.   [MB]    Clinical Course User Index [MB] Terrilee Files, MD     Final Clinical Impressions(s) / ED Diagnoses   Final diagnoses:  Ureterolithiasis  Community acquired pneumonia of right lung, unspecified part of lung    ED Discharge Orders    None       Terrilee Files, MD 12/16/17 [phone]

## 2017-12-15 NOTE — Discharge Instructions (Addendum)
You are here in the emergency department for evaluation of hematuria and lower abdominal pain.  You had a CAT scan that shows you have a kidney stone close to the bladder that likely will pass.  Also on the CAT scan there was some signs that you may have pneumonia despite you not having any symptoms.  We are prescribing antibiotics to take and ibuprofen for pain.  Please return if any fever or worsening symptoms.

## 2017-12-15 NOTE — ED Triage Notes (Signed)
[facility] at [facility] due to blood in urine since Monday. Sent to ED. Pt reports increase pain with lying down,. Denies pain with urination but has lower abdominal pain

## 2017-12-28 ENCOUNTER — Ambulatory Visit: Payer: BLUE CROSS/BLUE SHIELD | Admitting: Internal Medicine

## 2018-01-08 ENCOUNTER — Other Ambulatory Visit: Payer: Self-pay | Admitting: Internal Medicine

## 2018-01-11 ENCOUNTER — Ambulatory Visit (INDEPENDENT_AMBULATORY_CARE_PROVIDER_SITE_OTHER)
Admission: RE | Admit: 2018-01-11 | Discharge: 2018-01-11 | Disposition: A | Discharge status: Home / Self Care | Payer: BLUE CROSS/BLUE SHIELD | Source: Ambulatory Visit | Attending: Internal Medicine | Admitting: Internal Medicine

## 2018-01-11 ENCOUNTER — Encounter: Payer: Self-pay | Admitting: Internal Medicine

## 2018-01-11 ENCOUNTER — Ambulatory Visit: Payer: BLUE CROSS/BLUE SHIELD | Admitting: Internal Medicine

## 2018-01-11 VITALS — BP 110/62 | HR 78 | Ht 74.0 in | Wt 166.4 lb

## 2018-01-11 DIAGNOSIS — I471 Supraventricular tachycardia, unspecified: Secondary | ICD-10-CM

## 2018-01-11 DIAGNOSIS — J4550 Severe persistent asthma, uncomplicated: Secondary | ICD-10-CM | POA: Diagnosis not present

## 2018-01-11 DIAGNOSIS — J4541 Moderate persistent asthma with (acute) exacerbation: Secondary | ICD-10-CM

## 2018-01-11 NOTE — Progress Notes (Signed)
Patient ID: Drew Sandoval, male    DOB: June 12, 1975    MRN: [id number]  HPI M  former smoker followed for severe asthma/ COPD, chronic sinusitis, hyper-eosinophilia and hyper IgE, complicated by history SVT/cardioversion FENO 03/18/16- 100  High, indicates allergic asthma Office Spirometry 03/18/2016-severe obstructive airways disease FVC 2.62/51%, FEV1 1.30/31%, FEV1/FVC 0.50, FEF 25-75 0.61/14% Office Spirometry 01/25/17-severe obstructive airways disease. FVC 3.52/68%, FEV1 1.80/43%, ratio or 0.51, FEF 25-75% 0.76/18% IgE was too high for Xolair. He could not afford Nucala or allergy vaccine  ---------------------------------------------------- 09/27/2017- 42 year old male former smoker followed for severe asthma/ COPD, chronic sinusitis, persistent eosinophilia, hyper IgE-too high for Xolair, history SVT/cardioversion ----Asthma: Pt states he has noticed he has asthma attack either right before or about 1 week after having Cinqair infusion. Pt goes to UC in [place] when this happens. Deep cough-sometimes productive-clear to light green in color.  LOV in January for exacerbation- given pred taper and started Spiriva He never feels clear and stable and his breathing but says with Cinqair he is only averaging one episode per month severe enough that he has to go to an urgent care.  He cannot go without his home medications.  He like to Spiriva sample.  We discussed these medications, comparing Breo plus Spiriva to Trelegy. CXR -06/01/2017-  No active cardiopulmonary disease.  01/11/2018- 42 year old male former smoker followed for severe asthma/ COPD, chronic sinusitis, persistent eosinophilia, hyper IgE-too high for Xolair, history SVT/cardioversion -----Asthma: Pt was at ED for PNA and kidney stone. Pt has passed kidney stone and feels tired from PNA-completed Doxycycline Rx.  Trelegy, Cinqair (at [facility]), Singulair, neb albuterol, Albuterol hfa Cinqair has reduced his need for Urgent Care visits  to around once every 6 weeks now which is significantly better than baseline.  He considers Trelegy very good.  No acute concerns.  Presented to ER with a kidney stone and renal CT scan showed a right middle lobe pneumonia, treated with doxycycline.  He still feels a little more congested than usual but not sick.  Denies fever, discolored sputum.  ROS-see HPI  "+" = positive Constitutional:    weight loss, night sweats, fevers, chills, fatigue, lassitude. HEENT:    headaches, difficulty swallowing, tooth/dental problems, sore throat,       sneezing, itching, ear ache, +nasal congestion, +post nasal drip, snoring CV:    chest pain, orthopnea, PND, swelling in lower extremities, anasarca,                                                      dizziness, palpitations Resp:   +shortness of breath with exertion or at rest.                productive cough,  non-productive cough, coughing up of blood.              change in color of mucus.  wheezing.   Skin:    rash or lesions. GI:  No-   heartburn, indigestion, abdominal pain, nausea, vomiting,  GU: d. MS:   joint pain, stiffness, decreased range of motion, back pain. Neuro-     nothing unusual Psych:  change in mood or affect.  depression or anxiety.   memory loss.  Objective:   Physical Exam General- Alert, Oriented, Affect-reserved/ laconic, Distress-none acute;  Tall, thin Skin- +tatoo Lymphadenopathy- none  Head- atraumatic            Eyes- Gross vision intact, PERRLA, conjunctivae clear secretions            Ears- Hearing, canals normal            Nose- Clear, no-Septal dev, mucus, polyps, erosion, perforation             Throat- Mallampati II , mucosa clear , drainage- none, tonsils- atrophic Neck- flexible , trachea midline, no stridor , thyroid nl, carotid no bruit Chest - symmetrical excursion , unlabored           Heart/CV- RRR  , no murmur , no gallop  , no rub, nl s1 s2                           - JVD- none , edema- none, stasis  changes- none, varices- none           Lung-   diminished, unlabored,  Cough-none, dullness-none, rub- none,  Wheeze-none Abd- scaphoid  Br/ Gen/ Rectal- Not done, not indicated Extrem- cyanosis- none, clubbing, none, atrophy- none, strength- nl Neuro- +slight tremor

## 2018-01-11 NOTE — Assessment & Plan Note (Signed)
Heart rhythm is consistent with normal sinus on examination at this visit. Aggressive use of stimulant bronchodilators would be his biggest risk factor.

## 2018-01-11 NOTE — Assessment & Plan Note (Signed)
Still suboptimally controlled, since he estimates he makes a trip for urgent care management of asthma about every 6 weeks.  This is very much better than his baseline.  He is up-to-date on pneumonia vaccine.  Recent pneumonia clinically resolved. Plan-continue present meds, CXR.  He wants to wait until later in the fall for flu vaccine.

## 2018-01-11 NOTE — Patient Instructions (Signed)
Order- CXR    Dx Right middle lobe pneumonia  We can continue Cinqair injections  Don't forget that flu shot a little later this fall.  Please call if we can help

## 2018-01-12 ENCOUNTER — Encounter (HOSPITAL_COMMUNITY): Payer: Self-pay

## 2018-01-12 ENCOUNTER — Encounter (HOSPITAL_COMMUNITY)
Admission: RE | Admit: 2018-01-12 | Discharge: 2018-01-12 | Disposition: A | Discharge status: Home / Self Care | Payer: BLUE CROSS/BLUE SHIELD | Source: Ambulatory Visit | Attending: Internal Medicine | Admitting: Internal Medicine

## 2018-01-12 DIAGNOSIS — J4550 Severe persistent asthma, uncomplicated: Secondary | ICD-10-CM | POA: Diagnosis present

## 2018-01-12 MED ORDER — SODIUM CHLORIDE 0.9 % IV SOLN
Freq: Once | INTRAVENOUS | Status: AC
  Administered 2018-01-12: 09:00:00 via INTRAVENOUS

## 2018-01-12 MED ORDER — SODIUM CHLORIDE 0.9 % IV SOLN
228.3000 mg | Freq: Once | INTRAVENOUS | Status: AC
  Administered 2018-01-12: 228.3 mg via INTRAVENOUS
  Filled 2018-01-12: qty 23

## 2018-01-17 ENCOUNTER — Other Ambulatory Visit: Payer: Self-pay

## 2018-01-17 MED ORDER — MOXIFLOXACIN HCL 400 MG PO TABS: 400.0000 mg | ORAL_TABLET | Freq: Every day | ORAL | 0 refills | Status: DC

## 2018-02-09 ENCOUNTER — Encounter (HOSPITAL_COMMUNITY): Payer: Self-pay

## 2018-02-09 ENCOUNTER — Encounter (HOSPITAL_COMMUNITY)
Admission: RE | Admit: 2018-02-09 | Discharge: 2018-02-09 | Disposition: A | Discharge status: Home / Self Care | Payer: BLUE CROSS/BLUE SHIELD | Source: Ambulatory Visit | Attending: Internal Medicine | Admitting: Internal Medicine

## 2018-02-09 DIAGNOSIS — J4550 Severe persistent asthma, uncomplicated: Secondary | ICD-10-CM | POA: Insufficient documentation

## 2018-02-09 MED ORDER — SODIUM CHLORIDE 0.9 % IV SOLN: Freq: Once | INTRAVENOUS | Status: DC

## 2018-02-09 MED ORDER — SODIUM CHLORIDE 0.9 % IV SOLN
228.3000 mg | Freq: Once | INTRAVENOUS | Status: AC
  Administered 2018-02-09: 228.3 mg via INTRAVENOUS
  Filled 2018-02-09: qty 22.83

## 2018-02-13 ENCOUNTER — Other Ambulatory Visit: Payer: Self-pay | Admitting: Internal Medicine

## 2018-03-09 ENCOUNTER — Encounter (HOSPITAL_COMMUNITY)
Admission: RE | Admit: 2018-03-09 | Discharge: 2018-03-09 | Disposition: A | Discharge status: Home / Self Care | Payer: BLUE CROSS/BLUE SHIELD | Source: Ambulatory Visit | Attending: Internal Medicine | Admitting: Internal Medicine

## 2018-03-09 ENCOUNTER — Encounter (HOSPITAL_COMMUNITY): Payer: Self-pay

## 2018-03-09 DIAGNOSIS — J4550 Severe persistent asthma, uncomplicated: Secondary | ICD-10-CM | POA: Diagnosis not present

## 2018-03-09 MED ORDER — SODIUM CHLORIDE 0.9 % IV SOLN
Freq: Once | INTRAVENOUS | Status: AC
  Administered 2018-03-09: 09:00:00 via INTRAVENOUS

## 2018-03-09 MED ORDER — SODIUM CHLORIDE 0.9 % IV SOLN
228.3000 mg | Freq: Once | INTRAVENOUS | Status: AC
  Administered 2018-03-09: 228.3 mg via INTRAVENOUS
  Filled 2018-03-09: qty 22.83

## 2018-04-05 ENCOUNTER — Encounter (HOSPITAL_COMMUNITY): Payer: Self-pay

## 2018-04-05 ENCOUNTER — Encounter (HOSPITAL_COMMUNITY)
Admission: RE | Admit: 2018-04-05 | Discharge: 2018-04-05 | Disposition: A | Discharge status: Home / Self Care | Payer: BLUE CROSS/BLUE SHIELD | Source: Ambulatory Visit | Attending: Internal Medicine | Admitting: Internal Medicine

## 2018-04-05 DIAGNOSIS — J4550 Severe persistent asthma, uncomplicated: Secondary | ICD-10-CM | POA: Insufficient documentation

## 2018-04-05 MED ORDER — SODIUM CHLORIDE 0.9 % IV SOLN
Freq: Once | INTRAVENOUS | Status: AC
  Administered 2018-04-05: 09:00:00 via INTRAVENOUS

## 2018-04-05 MED ORDER — SODIUM CHLORIDE 0.9 % IV SOLN
228.3000 mg | Freq: Once | INTRAVENOUS | Status: AC
  Administered 2018-04-05: 228.3 mg via INTRAVENOUS
  Filled 2018-04-05: qty 22.83

## 2018-04-24 DIAGNOSIS — N62 Hypertrophy of breast: Secondary | ICD-10-CM | POA: Insufficient documentation

## 2018-05-05 ENCOUNTER — Encounter (HOSPITAL_COMMUNITY): Admission: RE | Admit: 2018-05-05 | Payer: BLUE CROSS/BLUE SHIELD | Source: Ambulatory Visit

## 2018-05-08 ENCOUNTER — Encounter: Payer: Self-pay | Admitting: *Deleted

## 2018-05-08 ENCOUNTER — Telehealth: Payer: Self-pay | Admitting: Internal Medicine

## 2018-05-08 NOTE — Telephone Encounter (Signed)
Spoke with patient-states he needs letter for work to have his hours adjusted to 10:30pm to 7:00am from now on so he can continue getting his infusions for asthma.   CY agrees for letter; letter completed,signed by CY, and given to patient. Nothing more needed at this time.

## 2018-05-09 ENCOUNTER — Encounter (HOSPITAL_COMMUNITY): Admission: RE | Admit: 2018-05-09 | Payer: BLUE CROSS/BLUE SHIELD | Source: Ambulatory Visit

## 2018-05-11 ENCOUNTER — Encounter (HOSPITAL_COMMUNITY): Payer: Self-pay

## 2018-05-11 ENCOUNTER — Encounter (HOSPITAL_COMMUNITY)
Admission: RE | Admit: 2018-05-11 | Discharge: 2018-05-11 | Disposition: A | Discharge status: Home / Self Care | Payer: BLUE CROSS/BLUE SHIELD | Source: Ambulatory Visit | Attending: Internal Medicine | Admitting: Internal Medicine

## 2018-05-11 DIAGNOSIS — J4550 Severe persistent asthma, uncomplicated: Secondary | ICD-10-CM | POA: Insufficient documentation

## 2018-05-11 MED ORDER — SODIUM CHLORIDE 0.9 % IV SOLN
Freq: Once | INTRAVENOUS | Status: AC
  Administered 2018-05-11: 250 mL via INTRAVENOUS

## 2018-05-11 MED ORDER — SODIUM CHLORIDE 0.9 % IV SOLN
228.3000 mg | Freq: Once | INTRAVENOUS | Status: AC
  Administered 2018-05-11: 228.3 mg via INTRAVENOUS
  Filled 2018-05-11: qty 22.83

## 2018-06-08 ENCOUNTER — Encounter (HOSPITAL_COMMUNITY)
Admission: RE | Admit: 2018-06-08 | Discharge: 2018-06-08 | Disposition: A | Discharge status: Home / Self Care | Payer: BLUE CROSS/BLUE SHIELD | Source: Ambulatory Visit | Attending: Internal Medicine | Admitting: Internal Medicine

## 2018-06-08 ENCOUNTER — Encounter (HOSPITAL_COMMUNITY): Payer: Self-pay

## 2018-06-08 DIAGNOSIS — J4550 Severe persistent asthma, uncomplicated: Secondary | ICD-10-CM | POA: Diagnosis not present

## 2018-06-08 MED ORDER — SODIUM CHLORIDE 0.9 % IV SOLN
Freq: Once | INTRAVENOUS | Status: AC
  Administered 2018-06-08: 08:00:00 via INTRAVENOUS

## 2018-06-08 MED ORDER — SODIUM CHLORIDE 0.9 % IV SOLN
3.0000 mg/kg | Freq: Once | INTRAVENOUS | Status: AC
  Administered 2018-06-08: 240 mg via INTRAVENOUS
  Filled 2018-06-08: qty 24

## 2018-06-12 ENCOUNTER — Telehealth: Payer: Self-pay | Admitting: Internal Medicine

## 2018-06-12 MED ORDER — PREDNISONE 10 MG PO TABS: ORAL_TABLET | ORAL | 0 refills | Status: DC

## 2018-06-12 NOTE — Telephone Encounter (Signed)
Primary Pulmonologist:Young Last office visit and with whom: 01/11/18-Dr Young What do we see them for (pulmonary problems): Asthma Last OV assessment/plan:  Instructions      Return in about 6 months (around 07/12/2018).  Order- CXR    Dx Right middle lobe pneumonia  We can continue Cinqair injections  Don't forget that flu shot a little later this fall.  Please call if we can help        Was appointment offered to patient (explain)?  Yes, Patient declined.  He is requesting something to be called to [facility], if possible.   Reason for call:  Called and spoke with Patient.  He stated that he started having asthma flare Saturday evening.  He has been using his Trelegy daily, albuterol nebs, and his albuterol inhaler.   Allergies  Allergen Reactions  . Azithromycin Rash  . Alvesco [Ciclesonide]     Possible reaction- rash  . Anoro Ellipta [Umeclidinium-Vilanterol]     Possible- rash  . Banana Swelling    Throat swelling   . Penicillins Rash    Has patient had a PCN reaction causing immediate rash, facial/tongue/throat swelling, SOB or lightheadedness with hypotension: Yes Has patient had a PCN reaction causing severe rash involving mucus membranes or skin necrosis: No Has patient had a PCN reaction that required hospitalization Yes Has patient had a PCN reaction occurring within the last 10 years: No If all of the above answers are "NO", then may proceed with Cephalosporin use.     Current Outpatient Medications on File Prior to Visit  Medication Sig Dispense Refill  . albuterol (PROVENTIL HFA;VENTOLIN HFA) 108 (90 Base) MCG/ACT inhaler TAKE 2 PUFFS BY MOUTH EVERY 4 HOURS AS NEEDED 8.5 Inhaler 5  . albuterol (PROVENTIL) (2.5 MG/3ML) 0.083% nebulizer solution Take 3 mLs (2.5 mg total) by nebulization every 6 (six) hours as needed for wheezing or shortness of breath. 360 mL 5  . EPINEPHrine 0.3 mg/0.3 mL IJ SOAJ injection Inject into thigh for severe allergic  reaction (Patient taking differently: Inject 0.3 mg into the muscle once. Inject into thigh for severe allergic reaction) 1 Device prn  . Fluticasone-Umeclidin-Vilant (TRELEGY ELLIPTA) 100-62.5-25 MCG/INH AEPB Inhale 1 puff into the lungs daily. Rinse mouth 60 each 12  . ibuprofen (ADVIL,MOTRIN) 800 MG tablet Take 1 tablet (800 mg total) by mouth every 8 (eight) hours as needed for moderate pain. 21 tablet 0  . montelukast (SINGULAIR) 10 MG tablet TAKE 1 TABLET BY MOUTH EVERYDAY AT BEDTIME 90 tablet 3  . moxifloxacin (AVELOX) 400 MG tablet Take 1 tablet (400 mg total) by mouth daily. 7 tablet 0  . Reslizumab (CINQAIR IV) Inject 228.3 mg into the vein every 30 (thirty) days.     No current facility-administered medications on file prior to visit.   message routed to Dr Maple Hudson

## 2018-06-12 NOTE — Telephone Encounter (Signed)
Offer prednisone taper  10 mg, # 20, 4 X 2 DAYS, 3 X 2 DAYS, 2 X 2 DAYS, 1 X 2 DAYS  Go to ER if gets worse.

## 2018-06-12 NOTE — Telephone Encounter (Signed)
Called pt and advised message from the provider. Pt understood and verbalized understanding. Nothing further is needed.    Rx sent in. 

## 2018-07-06 ENCOUNTER — Encounter (HOSPITAL_COMMUNITY)
Admission: RE | Admit: 2018-07-06 | Discharge: 2018-07-06 | Disposition: A | Discharge status: Home / Self Care | Payer: BLUE CROSS/BLUE SHIELD | Source: Ambulatory Visit | Attending: Internal Medicine | Admitting: Internal Medicine

## 2018-07-06 ENCOUNTER — Encounter (HOSPITAL_COMMUNITY): Payer: Self-pay

## 2018-07-06 DIAGNOSIS — J4550 Severe persistent asthma, uncomplicated: Secondary | ICD-10-CM | POA: Diagnosis not present

## 2018-07-06 MED ORDER — SODIUM CHLORIDE 0.9 % IV SOLN
228.3000 mg | Freq: Once | INTRAVENOUS | Status: AC
  Administered 2018-07-06: 228.3 mg via INTRAVENOUS
  Filled 2018-07-06: qty 22.83

## 2018-07-06 MED ORDER — SODIUM CHLORIDE 0.9 % IV SOLN
Freq: Once | INTRAVENOUS | Status: AC
Start: 2018-07-06 — End: 2018-07-06
  Administered 2018-07-06: 09:00:00 via INTRAVENOUS

## 2018-07-13 ENCOUNTER — Ambulatory Visit: Payer: BLUE CROSS/BLUE SHIELD | Admitting: Internal Medicine

## 2018-07-13 ENCOUNTER — Encounter: Payer: Self-pay | Admitting: Internal Medicine

## 2018-07-13 DIAGNOSIS — D721 Eosinophilia, unspecified: Secondary | ICD-10-CM

## 2018-07-13 DIAGNOSIS — J4550 Severe persistent asthma, uncomplicated: Secondary | ICD-10-CM

## 2018-07-13 NOTE — Assessment & Plan Note (Signed)
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  Plan- continue current regimen.

## 2018-07-13 NOTE — Progress Notes (Signed)
Patient ID: Drew Sandoval, male    DOB: 1975-09-16    MRN: [id number]  HPI M  former smoker followed for severe asthma/ COPD, chronic sinusitis, hyper-eosinophilia and hyper IgE, complicated by history SVT/cardioversion FENO 03/18/16- 100  High, indicates allergic asthma Office Spirometry 03/18/2016-severe obstructive airways disease FVC 2.62/51%, FEV1 1.30/31%, FEV1/FVC 0.50, FEF 25-75 0.61/14% Office Spirometry 01/25/17-severe obstructive airways disease. FVC 3.52/68%, FEV1 1.80/43%, ratio or 0.51, FEF 25-75% 0.76/18% IgE was too high for Xolair. He could not afford Nucala or allergy vaccine. Cinqair infusion@ [facility]- ordered 07/21/17 ---------------------------------------------------- 01/11/2018- 43 year old male former smoker followed for severe asthma/ COPD, chronic sinusitis, persistent eosinophilia, hyper IgE-too high for Xolair, history SVT/cardioversion -----Asthma: Pt was at ED for PNA and kidney stone. Pt has passed kidney stone and feels tired from PNA-completed Doxycycline Rx.  Trelegy, Cinqair (at [facility]), Singulair, neb albuterol, Albuterol hfa Cinqair has reduced his need for Urgent Care visits to around once every 6 weeks now which is significantly better than baseline.  He considers Trelegy very good.  No acute concerns.  Presented to ER with a kidney stone and renal CT scan showed a right middle lobe pneumonia, treated with doxycycline.  He still feels a little more congested than usual but not sick.  Denies fever, discolored sputum.  07/13/2018 -43 year old male former smoker followed for severe asthma/ COPD, chronic sinusitis, persistent eosinophilia, hyper IgE-too high for Xolair, history SVT/cardioversion  Kidney stone Trelegy, Cinqair (at [facility]), Singulair, neb albuterol, Albuterol hfa Latest prednisone taper for exacerb 06/12/2018. Latest Cinqair 07/06/2018 -----flare-up 1 week ago, was given pred, breathing is better Overall he continues to do extremely well on  Cinqair. An episode in early February was triggered by smoke exposure and responded to the first prednisone taper since last fall.He now feels well, using Trelegy daily, with no recent need for rescue or nebulizer.  It also helps that he works 3rd shift now, which avoids exposure to flour/ dust associated with 1st shift at his job. CXR 01/11/18- IMPRESSION: Right middle lobe pneumonia.  ROS-see HPI  "+" = positive Constitutional:    weight loss, night sweats, fevers, chills, fatigue, lassitude. HEENT:    headaches, difficulty swallowing, tooth/dental problems, sore throat,       sneezing, itching, ear ache, +nasal congestion, +post nasal drip, snoring CV:    chest pain, orthopnea, PND, swelling in lower extremities, anasarca,                                                      dizziness, palpitations Resp:   +shortness of breath with exertion or at rest.                productive cough,  non-productive cough, coughing up of blood.              change in color of mucus.  wheezing.   Skin:    rash or lesions. GI:  No-   heartburn, indigestion, abdominal pain, nausea, vomiting,  GU: d. MS:   joint pain, stiffness, decreased range of motion, back pain. Neuro-     nothing unusual Psych:  change in mood or affect.  depression or anxiety.   memory loss.  Objective:   Physical Exam General- Alert, Oriented, Affect-reserved/ laconic, Distress-none acute;  Tall, thin Skin- +tatoo Lymphadenopathy- none Head- atraumatic  Eyes- Gross vision intact, PERRLA, conjunctivae clear secretions            Ears- Hearing, canals normal            Nose- Clear, no-Septal dev, mucus, polyps, erosion, perforation             Throat- Mallampati II , mucosa clear , drainage- none, tonsils- atrophic Neck- flexible , trachea midline, no stridor , thyroid nl, carotid no bruit Chest - symmetrical excursion , unlabored           Heart/CV- RRR  , no murmur , no gallop  , no rub, nl s1 s2                            - JVD- none , edema- none, stasis changes- none, varices- none           Lung-   diminished, unlabored,  Cough-none, dullness-none, rub- none,  Wheeze-none Abd- scaphoid  Br/ Gen/ Rectal- Not done, not indicated Extrem- cyanosis- none, clubbing, none, atrophy- none, strength- nl Neuro- +slight tremor

## 2018-07-13 NOTE — Assessment & Plan Note (Signed)
Watching this over time. Last lab showed normal eos, but was likley on steroid therapy at this time.

## 2018-07-13 NOTE — Patient Instructions (Signed)
We can continue current meds  Please call for refills and if we can help you.

## 2018-08-03 ENCOUNTER — Other Ambulatory Visit: Payer: Self-pay

## 2018-08-03 ENCOUNTER — Encounter (HOSPITAL_COMMUNITY): Payer: Self-pay

## 2018-08-03 ENCOUNTER — Encounter (HOSPITAL_COMMUNITY)
Admission: RE | Admit: 2018-08-03 | Discharge: 2018-08-03 | Disposition: A | Discharge status: Home / Self Care | Payer: BLUE CROSS/BLUE SHIELD | Source: Ambulatory Visit | Attending: Internal Medicine | Admitting: Internal Medicine

## 2018-08-03 DIAGNOSIS — Z01818 Encounter for other preprocedural examination: Secondary | ICD-10-CM | POA: Insufficient documentation

## 2018-08-03 MED ORDER — SODIUM CHLORIDE 0.9 % IV SOLN
3.0000 mg/kg | Freq: Once | INTRAVENOUS | Status: AC
  Administered 2018-08-03: 240 mg via INTRAVENOUS
  Filled 2018-08-03: qty 24

## 2018-08-03 MED ORDER — SODIUM CHLORIDE 0.9 % IV SOLN
Freq: Once | INTRAVENOUS | Status: AC
Start: 2018-08-03 — End: 2018-08-03
  Administered 2018-08-03: 08:00:00 via INTRAVENOUS

## 2018-08-15 ENCOUNTER — Encounter: Payer: Self-pay | Admitting: Neurology

## 2018-08-15 DIAGNOSIS — Z1331 Encounter for screening for depression: Secondary | ICD-10-CM | POA: Diagnosis not present

## 2018-08-15 DIAGNOSIS — Z0000 Encounter for general adult medical examination without abnormal findings: Secondary | ICD-10-CM | POA: Diagnosis not present

## 2018-08-15 DIAGNOSIS — Z6823 Body mass index (BMI) 23.0-23.9, adult: Secondary | ICD-10-CM | POA: Diagnosis not present

## 2018-08-15 DIAGNOSIS — J45909 Unspecified asthma, uncomplicated: Secondary | ICD-10-CM | POA: Diagnosis not present

## 2018-08-15 DIAGNOSIS — Z299 Encounter for prophylactic measures, unspecified: Secondary | ICD-10-CM | POA: Diagnosis not present

## 2018-08-31 ENCOUNTER — Other Ambulatory Visit: Payer: Self-pay

## 2018-08-31 ENCOUNTER — Encounter (HOSPITAL_COMMUNITY)
Admission: RE | Admit: 2018-08-31 | Discharge: 2018-08-31 | Disposition: A | Discharge status: Home / Self Care | Payer: BLUE CROSS/BLUE SHIELD | Source: Ambulatory Visit | Attending: Internal Medicine | Admitting: Internal Medicine

## 2018-08-31 DIAGNOSIS — J4550 Severe persistent asthma, uncomplicated: Secondary | ICD-10-CM | POA: Insufficient documentation

## 2018-08-31 MED ORDER — SODIUM CHLORIDE 0.9 % IV SOLN
228.3000 mg | Freq: Once | INTRAVENOUS | Status: AC
  Administered 2018-08-31: 228.3 mg via INTRAVENOUS
  Filled 2018-08-31: qty 22.83

## 2018-08-31 MED ORDER — SODIUM CHLORIDE 0.9 % IV SOLN
Freq: Once | INTRAVENOUS | Status: AC
  Administered 2018-08-31: 250 mL via INTRAVENOUS

## 2018-09-06 ENCOUNTER — Encounter: Payer: Self-pay | Admitting: Internal Medicine

## 2018-09-06 ENCOUNTER — Telehealth: Payer: Self-pay | Admitting: Internal Medicine

## 2018-09-06 NOTE — Telephone Encounter (Signed)
I spoke to Mr Wohler, I hadn't seen him in a long time. He goes on to tell me he's been waiting on a dr's note for work & school since 8:00. I went to ask Dr. Maple Hudson if he had had a chance to type the letter. He had just printed it out. I told Dr. Maple Hudson I would explain what happened and give pt our apologies.  Whoever asked him to write the letter didn't tell CY the pt was waiting. Nothing further needed.

## 2018-09-06 NOTE — Telephone Encounter (Signed)
Letter was written by CY and handed to pt. Nothing further needed.

## 2018-09-06 NOTE — Telephone Encounter (Signed)
Dr Maple Hudson,  Patient is requesting a letter for work stating he needs to wear a mask at work.  Patient stated he needs a thicker mask then the one he is given.  Dr Maple Hudson, please advise

## 2018-09-06 NOTE — Telephone Encounter (Signed)
Patient came into office this morning requesting to pick up his note from Dr Maple Hudson. Explained office just opened.  Patient stated he will wait in the car in [facility] parking lot until called to pickup note.  Patient # [phone].

## 2018-09-21 NOTE — Progress Notes (Signed)
See telephone note from 09/06/2018. Pt was given this letter on this date. Nothing further needed at this time.

## 2018-09-28 ENCOUNTER — Encounter (HOSPITAL_COMMUNITY)
Admission: RE | Admit: 2018-09-28 | Discharge: 2018-09-28 | Disposition: A | Discharge status: Home / Self Care | Payer: BLUE CROSS/BLUE SHIELD | Source: Ambulatory Visit | Attending: Internal Medicine | Admitting: Internal Medicine

## 2018-09-28 ENCOUNTER — Other Ambulatory Visit: Payer: Self-pay

## 2018-09-28 DIAGNOSIS — J4550 Severe persistent asthma, uncomplicated: Secondary | ICD-10-CM | POA: Insufficient documentation

## 2018-09-28 MED ORDER — SODIUM CHLORIDE 0.9 % IV SOLN
Freq: Once | INTRAVENOUS | Status: AC
  Administered 2018-09-28: 09:00:00 via INTRAVENOUS

## 2018-09-28 MED ORDER — SODIUM CHLORIDE 0.9 % IV SOLN
228.3000 mg | Freq: Once | INTRAVENOUS | Status: AC
  Administered 2018-09-28: 228.3 mg via INTRAVENOUS
  Filled 2018-09-28: qty 22.83

## 2018-10-26 ENCOUNTER — Encounter (HOSPITAL_COMMUNITY)
Admission: RE | Admit: 2018-10-26 | Discharge: 2018-10-26 | Disposition: A | Discharge status: Home / Self Care | Payer: BC Managed Care – PPO | Source: Ambulatory Visit | Attending: Internal Medicine | Admitting: Internal Medicine

## 2018-10-26 ENCOUNTER — Other Ambulatory Visit: Payer: Self-pay

## 2018-10-26 DIAGNOSIS — J4550 Severe persistent asthma, uncomplicated: Secondary | ICD-10-CM | POA: Insufficient documentation

## 2018-10-26 MED ORDER — SODIUM CHLORIDE 0.9 % IV SOLN
3.0000 mg/kg | Freq: Once | INTRAVENOUS | Status: AC
  Administered 2018-10-26: 240 mg via INTRAVENOUS
  Filled 2018-10-26: qty 24

## 2018-10-26 MED ORDER — SODIUM CHLORIDE 0.9 % IV SOLN
Freq: Once | INTRAVENOUS | Status: AC
  Administered 2018-10-26: 09:00:00 via INTRAVENOUS

## 2018-11-02 ENCOUNTER — Other Ambulatory Visit: Payer: Self-pay | Admitting: Internal Medicine

## 2018-11-23 ENCOUNTER — Encounter (HOSPITAL_COMMUNITY): Payer: Self-pay

## 2018-11-23 ENCOUNTER — Other Ambulatory Visit: Payer: Self-pay

## 2018-11-23 ENCOUNTER — Encounter (HOSPITAL_COMMUNITY)
Admission: RE | Admit: 2018-11-23 | Discharge: 2018-11-23 | Disposition: A | Discharge status: Home / Self Care | Payer: BC Managed Care – PPO | Source: Ambulatory Visit | Attending: Internal Medicine | Admitting: Internal Medicine

## 2018-11-23 DIAGNOSIS — J4550 Severe persistent asthma, uncomplicated: Secondary | ICD-10-CM | POA: Insufficient documentation

## 2018-11-23 MED ORDER — SODIUM CHLORIDE 0.9 % IV SOLN
INTRAVENOUS | Status: DC
  Administered 2018-11-23: 08:00:00 via INTRAVENOUS

## 2018-11-23 MED ORDER — RESLIZUMAB 100 MG/10ML IV SOLN
228.3000 mg | INTRAVENOUS | Status: DC
  Administered 2018-11-23: 228.3 mg via INTRAVENOUS
  Filled 2018-11-23: qty 23
  Filled 2018-11-23: qty 22.83

## 2018-12-21 ENCOUNTER — Encounter (HOSPITAL_COMMUNITY): Payer: Self-pay

## 2018-12-21 ENCOUNTER — Encounter (HOSPITAL_COMMUNITY)
Admission: RE | Admit: 2018-12-21 | Discharge: 2018-12-21 | Disposition: A | Discharge status: Home / Self Care | Payer: BC Managed Care – PPO | Source: Ambulatory Visit | Attending: Internal Medicine | Admitting: Internal Medicine

## 2018-12-21 ENCOUNTER — Other Ambulatory Visit: Payer: Self-pay

## 2018-12-21 DIAGNOSIS — J4550 Severe persistent asthma, uncomplicated: Secondary | ICD-10-CM | POA: Insufficient documentation

## 2018-12-21 MED ORDER — SODIUM CHLORIDE 0.9 % IV SOLN
228.3000 mg | Freq: Once | INTRAVENOUS | Status: AC
  Administered 2018-12-21: 09:00:00 228.3 mg via INTRAVENOUS
  Filled 2018-12-21: qty 22.83

## 2018-12-21 MED ORDER — SODIUM CHLORIDE 0.9 % IV SOLN
INTRAVENOUS | Status: DC
  Administered 2018-12-21: 250 mL via INTRAVENOUS

## 2019-01-16 ENCOUNTER — Ambulatory Visit: Payer: BC Managed Care – PPO | Admitting: Internal Medicine

## 2019-01-16 ENCOUNTER — Other Ambulatory Visit: Payer: Self-pay

## 2019-01-16 ENCOUNTER — Encounter: Payer: Self-pay | Admitting: Internal Medicine

## 2019-01-16 DIAGNOSIS — J4550 Severe persistent asthma, uncomplicated: Secondary | ICD-10-CM | POA: Diagnosis not present

## 2019-01-16 DIAGNOSIS — I471 Supraventricular tachycardia: Secondary | ICD-10-CM

## 2019-01-16 DIAGNOSIS — Z23 Encounter for immunization: Secondary | ICD-10-CM | POA: Diagnosis not present

## 2019-01-16 NOTE — Progress Notes (Signed)
Patient ID: Drew Sandoval, male    DOB: [birth year]    MRN: [id number]  HPI M  former smoker followed for severe asthma/ COPD, chronic sinusitis, hyper-eosinophilia and hyper IgE, complicated by history SVT/cardioversion FENO 03/18/16- 100  High, indicates allergic asthma Office Spirometry 03/18/2016-severe obstructive airways disease FVC 2.62/51%, FEV1 1.30/31%, FEV1/FVC 0.50, FEF 25-75 0.61/14% Office Spirometry 01/25/17-severe obstructive airways disease. FVC 3.52/68%, FEV1 1.80/43%, ratio or 0.51, FEF 25-75% 0.76/18% IgE was too high for Xolair. He could not afford Nucala or allergy vaccine. Cinqair infusion@ [facility]- ordered 07/21/17 ----------------------------------------------------  07/13/2018 -43 year old male former smoker followed for severe asthma/ COPD, chronic sinusitis, persistent eosinophilia, hyper IgE-too high for Xolair, history SVT/cardioversion  Kidney stone Trelegy, Cinqair (at [facility]), Singulair, neb albuterol, Albuterol hfa Latest prednisone taper for exacerb 06/12/2018. Latest Cinqair 07/06/2018 -----flare-up 1 week ago, was given pred, breathing is better Overall he continues to do extremely well on Cinqair. An episode in early February was triggered by smoke exposure and responded to the first prednisone taper since last fall.He now feels well, using Trelegy daily, with no recent need for rescue or nebulizer.  It also helps that he works 3rd shift now, which avoids exposure to flour/ dust associated with 1st shift at his job. CXR 01/11/18- IMPRESSION: Right middle lobe pneumonia.  01/16/2019- 43 year old male former smoker followed for severe asthma/ COPD/ Cinqair, chronic sinusitis, persistent eosinophilia, hyper IgE-too high for Xolair, history SVT/cardioversion, Kidney stone -----pt states his breathing has been "excellent" since LOV; takes Trelegy daily Gets Cinqair infusions at [facility] every 30 days. Hasn't needed rescue inhaler in long time. No concerns and  denies need for refills.   ROS-see HPI  "+" = positive Constitutional:    weight loss, night sweats, fevers, chills, fatigue, lassitude. HEENT:    headaches, difficulty swallowing, tooth/dental problems, sore throat,       sneezing, itching, ear ache, +nasal congestion, +post nasal drip, snoring CV:    chest pain, orthopnea, PND, swelling in lower extremities, anasarca,                                                      dizziness, palpitations Resp:   +shortness of breath with exertion or at rest.                productive cough,  non-productive cough, coughing up of blood.              change in color of mucus.  wheezing.   Skin:    rash or lesions. GI:  No-   heartburn, indigestion, abdominal pain, nausea, vomiting,  GU: d. MS:   joint pain, stiffness, decreased range of motion, back pain. Neuro-     nothing unusual Psych:  change in mood or affect.  depression or anxiety.   memory loss.  Objective:   Physical Exam General- Alert, Oriented, Affect-reserved/ laconic, Distress-none acute;  Tall, thin Skin- +tatoos Lymphadenopathy- none Head- atraumatic            Eyes- Gross vision intact, PERRLA, conjunctivae clear secretions            Ears- Hearing, canals normal            Nose- Clear, no-Septal dev, mucus, polyps, erosion, perforation             Throat- Mallampati II ,  mucosa clear , drainage- none, tonsils- atrophic Neck- flexible , trachea midline, no stridor , thyroid nl, carotid no bruit Chest - symmetrical excursion , unlabored           Heart/CV- RRR  , no murmur , no gallop  , no rub, nl s1 s2                           - JVD- none , edema- none, stasis changes- none, varices- none           Lung-   Diminished/ clear, unlabored,  Cough-none, dullness-none, rub- none,  Wheeze-none Abd- scaphoid  Br/ Gen/ Rectal- Not done, not indicated Extrem- cyanosis- none, clubbing, none, atrophy- none, strength- nl Neuro- +slight tremor

## 2019-01-16 NOTE — Patient Instructions (Signed)
I am really pleased that you are doing so well ! We can continue present meds  Order flu vax- standard  Please call if we can help

## 2019-01-16 NOTE — Assessment & Plan Note (Signed)
No recurrence on current Rx. Less need for stimulant bronchodilators may help as well.

## 2019-01-16 NOTE — Assessment & Plan Note (Signed)
Doing really well on Trelegy and Cinqair. Uncomplicated now. Plan- flu vax, continue current Rx.

## 2019-01-18 ENCOUNTER — Encounter (HOSPITAL_COMMUNITY)
Admission: RE | Admit: 2019-01-18 | Discharge: 2019-01-18 | Disposition: A | Discharge status: Home / Self Care | Payer: BC Managed Care – PPO | Source: Ambulatory Visit | Attending: Internal Medicine | Admitting: Internal Medicine

## 2019-01-18 ENCOUNTER — Other Ambulatory Visit: Payer: Self-pay

## 2019-01-18 ENCOUNTER — Encounter (HOSPITAL_COMMUNITY): Payer: Self-pay

## 2019-01-18 DIAGNOSIS — J4550 Severe persistent asthma, uncomplicated: Secondary | ICD-10-CM | POA: Insufficient documentation

## 2019-01-18 MED ORDER — SODIUM CHLORIDE 0.9 % IV SOLN
228.3000 mg | Freq: Once | INTRAVENOUS | Status: AC
  Administered 2019-01-18: 09:00:00 228.3 mg via INTRAVENOUS
  Filled 2019-01-18: qty 22.83

## 2019-01-18 MED ORDER — SODIUM CHLORIDE 0.9 % IV SOLN
Freq: Once | INTRAVENOUS | Status: AC
  Administered 2019-01-18: 08:00:00 via INTRAVENOUS

## 2019-02-14 ENCOUNTER — Telehealth: Payer: Self-pay | Admitting: Internal Medicine

## 2019-02-14 MED ORDER — MONTELUKAST SODIUM 10 MG PO TABS: ORAL_TABLET | ORAL | 3 refills | Status: DC

## 2019-02-14 NOTE — Telephone Encounter (Signed)
Called and spoke w/ pt regarding his refill request for Singulair. Pt last seen 01/16/2019 with CY, where he was instructed to continue his current medications. I let him know we have placed the order for his Singulair prescription to his preferred pharmacy. Pt verbalized understanding with no additional questions. Nothing further needed at this time.

## 2019-02-15 ENCOUNTER — Other Ambulatory Visit: Payer: Self-pay

## 2019-02-15 ENCOUNTER — Encounter (HOSPITAL_COMMUNITY): Payer: Self-pay

## 2019-02-15 ENCOUNTER — Encounter (HOSPITAL_COMMUNITY)
Admission: RE | Admit: 2019-02-15 | Discharge: 2019-02-15 | Disposition: A | Discharge status: Home / Self Care | Payer: BC Managed Care – PPO | Source: Ambulatory Visit | Attending: Internal Medicine | Admitting: Internal Medicine

## 2019-02-15 DIAGNOSIS — J4550 Severe persistent asthma, uncomplicated: Secondary | ICD-10-CM | POA: Insufficient documentation

## 2019-02-15 MED ORDER — SODIUM CHLORIDE 0.9 % IV SOLN
228.3000 mg | Freq: Once | INTRAVENOUS | Status: AC
  Administered 2019-02-15: 09:00:00 228.3 mg via INTRAVENOUS
  Filled 2019-02-15: qty 22.83

## 2019-02-15 MED ORDER — SODIUM CHLORIDE 0.9 % IV SOLN
Freq: Once | INTRAVENOUS | Status: AC
  Administered 2019-02-15: 08:00:00 via INTRAVENOUS

## 2019-02-19 DIAGNOSIS — Z299 Encounter for prophylactic measures, unspecified: Secondary | ICD-10-CM | POA: Diagnosis not present

## 2019-02-19 DIAGNOSIS — R251 Tremor, unspecified: Secondary | ICD-10-CM | POA: Diagnosis not present

## 2019-02-19 DIAGNOSIS — Z6823 Body mass index (BMI) 23.0-23.9, adult: Secondary | ICD-10-CM | POA: Diagnosis not present

## 2019-02-19 DIAGNOSIS — J45909 Unspecified asthma, uncomplicated: Secondary | ICD-10-CM | POA: Diagnosis not present

## 2019-02-19 DIAGNOSIS — Z713 Dietary counseling and surveillance: Secondary | ICD-10-CM | POA: Diagnosis not present

## 2019-02-21 ENCOUNTER — Encounter: Payer: Self-pay | Admitting: Neurology

## 2019-02-22 ENCOUNTER — Other Ambulatory Visit: Payer: Self-pay | Admitting: Internal Medicine

## 2019-03-01 ENCOUNTER — Telehealth: Payer: Self-pay | Admitting: Internal Medicine

## 2019-03-01 NOTE — Telephone Encounter (Signed)
FMLA paperwork received by Sharl Ma and placed in CY's look-at for completion. Routing to CY for f/u. CY, please advise. Thank you.

## 2019-03-05 NOTE — Telephone Encounter (Signed)
Done

## 2019-03-05 NOTE — Telephone Encounter (Signed)
Rec'd completed paperwork - fwd to [employer] via interoffice mail -pr  

## 2019-03-12 ENCOUNTER — Telehealth: Payer: Self-pay | Admitting: Internal Medicine

## 2019-03-12 NOTE — Telephone Encounter (Signed)
Ok to provide letter as he requests, indicating he is under our care for asthma and needs leave from work until 11/4, hen he may return to unrestricted work activity.

## 2019-03-12 NOTE — Telephone Encounter (Signed)
Letter done as authorized by Dr Annamaria Boots Patient is aware Letter placed up front to be picked up at patient's convenience  Nothing further needed at this time; will sign off

## 2019-03-12 NOTE — Telephone Encounter (Signed)
Primary Pulmonologist: Young Last office visit and with whom: 01/16/2019 w/ Dr Annamaria Boots What do we see them for (pulmonary problems): severe persistent asthma Last OV assessment/plan:  Asthma, severe persistent - Deneise Lever, MD at 01/16/2019  9:58 AM Status: Written  Related Problem: Asthma, severe persistent   Doing really well on Trelegy and Cinqair. Uncomplicated now. Plan- flu vax, continue current Rx.    SVT (supraventricular tachycardia) - Deneise Lever, MD at 01/16/2019  9:59 AM  Status: Written  Related Problem: SVT (supraventricular tachycardia)   No recurrence on current Rx. Less need for stimulant bronchodilators may help as well.      Was appointment offered to patient (explain)?  Patient declined appt   Reason for call: increased SHOB, wheezing with occasional prod cough with clear mucus since Saturday.  Denies any f/c/s, hemoptysis, chest pain or head congestion.  States went shopping at [facility] on Saturday and feels he was exposed to something there.  Had cxr done at [facility], cxr was clear; given depo and neb treatment.  Planning to be out of work until 11/4 - may he have a work note for these dates, may return on 11/4 "with no restrictions" - he will come pick this up  Patient is taking all pulmonary meds.  Next Cinqair infusion is 11/5.  Patient would like refill on his proair (this has been done).  Dr Annamaria Boots please advise, thank you.  [facility] [address]  Allergies  Allergen Reactions  . Azithromycin Rash  . Alvesco [Ciclesonide]     Possible reaction- rash  . Anoro Ellipta [Umeclidinium-Vilanterol]     Possible- rash  . Banana Swelling    Throat swelling   . Penicillins Rash    Has patient had a PCN reaction causing immediate rash, facial/tongue/throat swelling, SOB or lightheadedness with hypotension: Yes Has patient had a PCN reaction causing severe rash involving mucus membranes or skin necrosis: No Has patient had a PCN  reaction that required hospitalization Yes Has patient had a PCN reaction occurring within the last 10 years: No If all of the above answers are "NO", then may proceed with Cephalosporin use.

## 2019-03-13 MED ORDER — ALBUTEROL SULFATE HFA 108 (90 BASE) MCG/ACT IN AERS: INHALATION_SPRAY | RESPIRATORY_TRACT | 5 refills | Status: DC

## 2019-03-13 NOTE — Addendum Note (Signed)
Addended by: Parke Poisson E on: 03/13/2019 03:09 PM   Modules accepted: Orders

## 2019-03-15 ENCOUNTER — Encounter (HOSPITAL_COMMUNITY): Payer: Self-pay

## 2019-03-15 ENCOUNTER — Other Ambulatory Visit: Payer: Self-pay

## 2019-03-15 ENCOUNTER — Encounter (HOSPITAL_COMMUNITY)
Admission: RE | Admit: 2019-03-15 | Discharge: 2019-03-15 | Disposition: A | Discharge status: Home / Self Care | Payer: BC Managed Care – PPO | Source: Ambulatory Visit | Attending: Internal Medicine | Admitting: Internal Medicine

## 2019-03-15 DIAGNOSIS — J4550 Severe persistent asthma, uncomplicated: Secondary | ICD-10-CM | POA: Insufficient documentation

## 2019-03-15 MED ORDER — SODIUM CHLORIDE 0.9 % IV SOLN
228.3000 mg | Freq: Once | INTRAVENOUS | Status: AC
  Administered 2019-03-15: 10:00:00 228.3 mg via INTRAVENOUS
  Filled 2019-03-15: qty 22.83

## 2019-03-15 MED ORDER — SODIUM CHLORIDE 0.9 % IV SOLN
Freq: Once | INTRAVENOUS | Status: AC
  Administered 2019-03-15: 09:00:00 via INTRAVENOUS

## 2019-03-16 NOTE — Progress Notes (Signed)
Drew Sandoval was seen today in the movement disorders clinic for neurologic consultation at the request of Kirstie Peri, MD.  The consultation is for the evaluation of L arm tremor.   Pt states that both hands shake but the L is much worse.  He is L handed.   The records that were made available to me were reviewed.  Pt is a 43 y/o with hx of asthma with tremor since 2008.  Pulmonary notes from Dr. Maple Hudson are reviewed as well.  He was last seen there on January 16, 2019 for severe asthma/COPD  Tremor: Yes.     At rest or with activation?  activation  When is it noted the most?  Writing/holding something  Fam hx of tremor?  No.  Located where?  L hand >>>R hand  Affected by caffeine:  No. (drinks dr pepper daily; coffee two times/week)  Affected by alcohol:  Doesn't drink enough to know  Affected by stress:  Yes.    Affected by fatigue:  No.  Spills soup if on spoon:  Yes.    Spills glass of liquid if full:  Yes.    Affects ADL's (tying shoes, brushing teeth, etc):  Yes.   (must stabilize toothbrush to put it on)  Tremor inducing meds:  Yes.  , albuterol/ventolin/singulair/trelegy (but pt thinks that tremor started prior to meds)  Other Specific Symptoms:  Voice: no change Sleep: trouble staying asleep  Vivid Dreams:  similar  Acting out dreams:  Some sleep talk Wet Pillows: Yes.   Postural symptoms:  Mild trouble  Falls?  No. Bradykinesia symptoms: no bradykinesia noted Loss of smell:  No. Loss of taste:  No. Urinary Incontinence:  No. Difficulty Swallowing:  No. Handwriting, micrographia: No. Depression:  No. Memory changes:  Mild trouble - "if I want to remember I do" Hallucinations:  No.  visual distortions: No. N/V:  No. Lightheaded:  No.  Syncope: No. Diplopia:  No. but blurry Dyskinesia:  No.   PREVIOUS MEDICATIONS: none to date  ALLERGIES:   Allergies  Allergen Reactions  . Azithromycin Rash  . Alvesco [Ciclesonide]     Possible reaction- rash  . Anoro  Ellipta [Umeclidinium-Vilanterol]     Possible- rash  . Banana Swelling    Throat swelling   . Penicillins Rash    Has patient had a PCN reaction causing immediate rash, facial/tongue/throat swelling, SOB or lightheadedness with hypotension: Yes Has patient had a PCN reaction causing severe rash involving mucus membranes or skin necrosis: No Has patient had a PCN reaction that required hospitalization Yes Has patient had a PCN reaction occurring within the last 10 years: No If all of the above answers are "NO", then may proceed with Cephalosporin use.      CURRENT MEDICATIONS:  Current Outpatient Medications  Medication Instructions  . albuterol (PROVENTIL) 2.5 mg, Nebulization, Every 6 hours PRN  . albuterol (VENTOLIN HFA) 108 (90 Base) MCG/ACT inhaler TAKE 2 PUFFS BY MOUTH EVERY 4 HOURS AS NEEDED  . EPINEPHrine 0.3 mg/0.3 mL IJ SOAJ injection Inject into thigh for severe allergic reaction  . ibuprofen (ADVIL) 800 mg, Oral, Every 8 hours PRN  . montelukast (SINGULAIR) 10 MG tablet TAKE 1 TABLET BY MOUTH EVERYDAY AT BEDTIME  . primidone (MYSOLINE) 50 mg, Oral, 2 times daily  . Reslizumab (CINQAIR IV) 228.3 mg, Intravenous, Every 30 days  . TRELEGY ELLIPTA 100-62.5-25 MCG/INH AEPB INHALE 1 PUFF INTO THE LUNGS DAILY. RINSE MOUTH    PAST MEDICAL HISTORY:  Past Medical History:  Diagnosis Date  . Asthma   . Seasonal allergies     PAST SURGICAL HISTORY:   Past Surgical History:  Procedure Laterality Date  . None      SOCIAL HISTORY:   Social History   Socioeconomic History  . Marital status: Single    Spouse name: Not on file  . Number of children: Not on file  . Years of education: Not on file  . Highest education level: Not on file  Occupational History  . Occupation: Psychologist, occupationalsanitation    Employer: [employer]  Social Needs  . Financial resource strain: Not on file  . Food insecurity    Worry: Not on file    Inability: Not on file  . Transportation needs    Medical:  Not on file    Non-medical: Not on file  Tobacco Use  . Smoking status: Former Smoker    Packs/day: 0.50    Years: 11.50    Pack years: 5.75    Types: Cigarettes    Quit date: 04/09/2010    Years since quitting: 8.9  . Smokeless tobacco: Former NeurosurgeonUser  . Tobacco comment: started at age 43.  1/2 ppd.    Substance and Sexual Activity  . Alcohol use: Yes    Comment: rarely  . Drug use: No  . Sexual activity: Yes    Birth control/protection: None  Lifestyle  . Physical activity    Days per week: Not on file    Minutes per session: Not on file  . Stress: Not on file  Relationships  . Social Musicianconnections    Talks on phone: Not on file    Gets together: Not on file    Attends religious service: Not on file    Active member of club or organization: Not on file    Attends meetings of clubs or organizations: Not on file    Relationship status: Not on file  . Intimate partner violence    Fear of current or ex partner: Not on file    Emotionally abused: Not on file    Physically abused: Not on file    Forced sexual activity: Not on file  Other Topics Concern  . Not on file  Social History Narrative   Left handed    FAMILY HISTORY:   Family Status  Relation Name Status  . Father  Deceased  . Mother  Deceased  . Sister  Alive    ROS:  Review of Systems  Constitutional: Negative.   HENT: Negative.   Eyes: Positive for blurred vision.  Respiratory: Positive for shortness of breath (DOE with asthma).   Cardiovascular: Negative.   Gastrointestinal: Negative.   Genitourinary: Negative.   Musculoskeletal: Negative.   Skin: Negative.   Endo/Heme/Allergies: Negative.   Psychiatric/Behavioral: Negative.     PHYSICAL EXAMINATION:    VITALS:   Vitals:   03/20/19 0827  BP: (!) 144/87  Pulse: 82  Resp: 14  Temp: 98 F (36.7 C)  SpO2: 95%  Weight: 174 lb 12.8 oz (79.3 kg)  Height: 6\' 2"  (1.88 m)    GEN:  The patient appears stated age and is in NAD. HEENT:   Normocephalic, atraumatic.  The mucous membranes are moist. The superficial temporal arteries are without ropiness or tenderness. CV:  RRR Lungs:  CTAB Neck/HEME:  There are no carotid bruits bilaterally.  Neurological examination:  Orientation: The patient is alert and oriented x3. Fund of knowledge is appropriate.  Recent and remote memory  are intact.  Attention and concentration are normal.    Able to name objects and repeat phrases. Cranial nerves: There is good facial symmetry.  Extraocular muscles are intact. The visual fields are full to confrontational testing. The speech is fluent and clear. Soft palate rises symmetrically and there is no tongue deviation. Hearing is intact to conversational tone. Sensation: Sensation is intact to light and pinprick throughout (facial, trunk, extremities). Vibration is intact at the bilateral big toe. There is no extinction with double simultaneous stimulation. There is no sensory dermatomal level identified. Motor: Strength is 5/5 in the bilateral upper and lower extremities.   Shoulder shrug is equal and symmetric.  There is no pronator drift. Deep tendon reflexes: Deep tendon reflexes are 2/4 at the bilateral biceps, triceps, brachioradialis, patella and achilles. Plantar responses are downgoing bilaterally.  Movement examination: Tone: There is normal tone in the upper and lower extremities. Abnormal movements: There is no rest tremor.  There is postural tremor in the bilateral upper extremities, slightly worse on the left.  He has trouble with Archimedes spirals bilaterally.  He has trouble pouring water from 1 glass to another and spills it. Coordination:  There is no decremation with RAM's, with any form of RAMS, including alternating supination and pronation of the forearm, hand opening and closing, finger taps, heel taps and toe taps. Gait and Station: The patient has no difficulty arising out of a deep-seated chair without the use of the hands.  The patient's stride length is good.  Patient is able to ambulate in a tandem fashion.  He is able to heel toe walk.  He is able to stand in the Romberg position.  Primary care physician sent labs dated August 15, 2018.  White blood cells were 7.3, hemoglobin 13.5, hematocrit 40.2 and platelets 232.  Total cholesterol is 172.  Sodium is 143, potassium 4.1, chloride 108, CO2 22, BUN 18, creatinine 0.82, glucose 79, AST 23, ALT 18, TSH 1.28   ASSESSMENT/PLAN:  1.  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  He is certainly on multiple medications for his lungs that induce tremor, but patient really thinks that his tremor began prior to being on these medications.  Discussed that he does not have Parkinson's disease, which she was worried about.  Reassurance was provided.  Discussed that there really are no tremor specific medications, but that we do have medications who side effects help tremor.  He would not be a candidate for beta-blocker given the severity of his asthma/COPD.  We discussed primidone and ultimately decided to work up to 50 mg twice per day.  Discussed risk, benefits, and side effects.  Patient expressed understanding.  He asked appropriate questions and answered those to the best of my ability.  We did cursorily discuss surgical interventions.  2.  Follow up is anticipated in the next 4-6 months, sooner should new neurologic issues arise.  Much greater than 50% of this visit was spent in counseling and coordinating care.  Total face to face time:  45 min  Cc:  Monico Blitz, MD

## 2019-03-20 ENCOUNTER — Other Ambulatory Visit: Payer: Self-pay

## 2019-03-20 ENCOUNTER — Ambulatory Visit (INDEPENDENT_AMBULATORY_CARE_PROVIDER_SITE_OTHER): Payer: BC Managed Care – PPO | Admitting: Neurology

## 2019-03-20 ENCOUNTER — Encounter: Payer: Self-pay | Admitting: Neurology

## 2019-03-20 VITALS — BP 144/87 | HR 82 | Temp 98.0°F | Resp 14 | Ht 74.0 in | Wt 174.8 lb

## 2019-03-20 DIAGNOSIS — G250 Essential tremor: Secondary | ICD-10-CM

## 2019-03-20 DIAGNOSIS — J4550 Severe persistent asthma, uncomplicated: Secondary | ICD-10-CM | POA: Diagnosis not present

## 2019-03-20 MED ORDER — PRIMIDONE 50 MG PO TABS: 50.0000 mg | ORAL_TABLET | Freq: Two times a day (BID) | ORAL | 1 refills | Status: DC

## 2019-03-20 NOTE — Patient Instructions (Signed)
Start primidone 50 mg - 1/2 tablet at bedtime for 1 week and then increase to 1 tablet at bedtime for a week and then, if you are tolerating it, you can increase to 1 tablet twice a day.

## 2019-03-24 ENCOUNTER — Other Ambulatory Visit: Payer: Self-pay

## 2019-03-24 ENCOUNTER — Encounter (HOSPITAL_COMMUNITY): Payer: Self-pay | Admitting: Emergency Medicine

## 2019-03-24 ENCOUNTER — Emergency Department (HOSPITAL_COMMUNITY)
Admission: EM | Admit: 2019-03-24 | Discharge: 2019-03-24 | Disposition: A | Discharge status: Home / Self Care | Payer: BC Managed Care – PPO | Attending: Emergency Medicine | Admitting: Emergency Medicine

## 2019-03-24 ENCOUNTER — Emergency Department (HOSPITAL_COMMUNITY): Payer: BC Managed Care – PPO

## 2019-03-24 DIAGNOSIS — R0602 Shortness of breath: Secondary | ICD-10-CM | POA: Diagnosis not present

## 2019-03-24 DIAGNOSIS — J4551 Severe persistent asthma with (acute) exacerbation: Secondary | ICD-10-CM | POA: Insufficient documentation

## 2019-03-24 DIAGNOSIS — Z87891 Personal history of nicotine dependence: Secondary | ICD-10-CM | POA: Diagnosis not present

## 2019-03-24 DIAGNOSIS — Z79899 Other long term (current) drug therapy: Secondary | ICD-10-CM | POA: Diagnosis not present

## 2019-03-24 MED ORDER — PREDNISONE 50 MG PO TABS
60.0000 mg | ORAL_TABLET | Freq: Once | ORAL | Status: AC
  Administered 2019-03-24: 60 mg via ORAL
  Filled 2019-03-24: qty 1

## 2019-03-24 MED ORDER — PREDNISONE 50 MG PO TABS: 50.0000 mg | ORAL_TABLET | Freq: Every day | ORAL | 0 refills | Status: DC

## 2019-03-24 MED ORDER — IPRATROPIUM BROMIDE HFA 17 MCG/ACT IN AERS
2.0000 | INHALATION_SPRAY | Freq: Once | RESPIRATORY_TRACT | Status: DC
  Filled 2019-03-24: qty 12.9

## 2019-03-24 MED ORDER — ALBUTEROL SULFATE HFA 108 (90 BASE) MCG/ACT IN AERS
6.0000 | INHALATION_SPRAY | Freq: Once | RESPIRATORY_TRACT | Status: AC
  Administered 2019-03-24: 6 via RESPIRATORY_TRACT
  Filled 2019-03-24: qty 6.7

## 2019-03-24 NOTE — ED Notes (Signed)
PA has eval'd  To Rad

## 2019-03-24 NOTE — ED Notes (Signed)
Denies cough 

## 2019-03-24 NOTE — ED Notes (Signed)
PA in to assess 

## 2019-03-24 NOTE — ED Notes (Signed)
Mr Drew Sandoval in to reassess

## 2019-03-24 NOTE — Discharge Instructions (Addendum)
Please take your medications, as prescribed.  Suggest that you take it in the morning as it can be activating and keep you awake.  Please follow-up with a pulmonologist for ongoing evaluation and management of your asthma.  Please return to the ED or seek medical attention should develop any new or worsening symptoms.

## 2019-03-24 NOTE — ED Notes (Signed)
Pt w hx of asthma Reports dyspnea x one month  Has seen PCP for same   Using home nebs with relief but then dyspnea returns   Here for eval

## 2019-03-24 NOTE — ED Triage Notes (Signed)
Pt c/o SOB x 1 mo with activity, reports he has a hx of asthma and has seen his PCP for same, was told to take breathing treatments and inhaler, reports relief but SOB comes back

## 2019-03-24 NOTE — ED Notes (Signed)
Mr Drew Sandoval in to discuss with pt   Upon his exit from room, pt states angrily when given inhaler, "I could have done this at home!" N asks if he told this to PA he is then quiet and uses inhaler as well as taking meds  Pt reports he does not take prednisone at home

## 2019-03-24 NOTE — ED Provider Notes (Signed)
[facility] EMERGENCY DEPARTMENT Provider Note   CSN: [id number] Arrival date & time: 03/24/19  1923     History   Chief Complaint Chief Complaint  Patient presents with  . Shortness of Breath    HPI Tuck Dulworth is a 43 y.o. male with PMH significant for severe persistent asthma presents to the ED for a 4-week history of shortness of breath consistent with his previous episodes of asthma exacerbations.  He reports that his asthma has been relatively well under control prior to this exacerbation.  He is followed by [facility] Pulmonology for his asthma, specifically Dr. Jetty Duhamel, and he receives Cinqair infusions on a monthly basis.  He reports that his most recent infusion did not help alleviate his recent asthma related shortness of breath.  He denies any headache, dizziness, chest pain, abdominal pain, nausea or vomiting, changes in bowel habits, or other symptoms.     HPI  Past Medical History:  Diagnosis Date  . Asthma   . Seasonal allergies     Patient Active Problem List   Diagnosis Date Noted  . Hypokalemia 03/10/2016  . Hypoxia 10/28/2014  . Acute conjunctivitis of right eye 10/07/2014  . Sustained SVT (HCC) 09/24/2014  . SVT (supraventricular tachycardia) (HCC) 09/24/2014  . Malnutrition of moderate degree (HCC) 09/24/2014  . Underweight 09/24/2014  . Seasonal and perennial allergic rhinitis 07/17/2014  . Acute respiratory failure with hypoxia (HCC) 07/23/2013  . Asthma exacerbation 06/27/2013  . 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 Eczema, allergic 07/22/2011  . Status asthmaticus 10/19/2010  . Sinusitis, maxillary, 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  . 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 Asthma, severe persistent 07/16/2010    Past Surgical History:  Procedure Laterality Date  . None          Home Medications    Prior to Admission medications   Medication Sig Start Date End Date Taking? Authorizing Provider  albuterol (PROVENTIL) (2.5 MG/3ML) 0.083%  nebulizer solution Take 3 mLs (2.5 mg total) by nebulization every 6 (six) hours as needed for wheezing or shortness of breath. 05/27/17   Jetty Duhamel D, MD  albuterol (VENTOLIN HFA) 108 (90 Base) MCG/ACT inhaler TAKE 2 PUFFS BY MOUTH EVERY 4 HOURS AS NEEDED 03/13/19   Jetty Duhamel D, MD  EPINEPHrine 0.3 mg/0.3 mL IJ SOAJ injection Inject into thigh for severe allergic reaction Patient taking differently: Inject 0.3 mg into the muscle once. Inject into thigh for severe allergic reaction 04/29/15   Jetty Duhamel D, MD  ibuprofen (ADVIL,MOTRIN) 800 MG tablet Take 1 tablet (800 mg total) by mouth every 8 (eight) hours as needed for moderate pain. 12/15/17   Terrilee Files, MD  montelukast (SINGULAIR) 10 MG tablet TAKE 1 TABLET BY MOUTH EVERYDAY AT BEDTIME 02/14/19   Jetty Duhamel D, MD  predniSONE (DELTASONE) 50 MG tablet Take 1 tablet (50 mg total) by mouth daily with breakfast. 03/24/19   Lorelee New, PA-C  primidone (MYSOLINE) 50 MG tablet Take 1 tablet (50 mg total) by mouth 2 (two) times daily. 03/20/19   Tat, Octaviano Batty, DO  Reslizumab (CINQAIR IV) Inject 228.3 mg into the vein every 30 (thirty) days.    [provider]  TRELEGY ELLIPTA 100-62.5-25 MCG/INH AEPB INHALE 1 PUFF INTO THE LUNGS DAILY. RINSE MOUTH 02/22/19   [name], MD    Family History Family History  Problem Relation Age of Onset  . Emphysema Father   . Lung cancer Mother   . Arthritis Sister     Social  History Social History   Tobacco Use  . Smoking status: Former Smoker    Packs/day: 0.50    Years: 11.50    Pack years: 5.75    Types: Cigarettes    Quit date: 04/09/2010    Years since quitting: 8.9  . Smokeless tobacco: Former NeurosurgeonUser  . Tobacco comment: started at age 43.  1/2 ppd.    Substance Use Topics  . Alcohol use: Yes    Comment: rarely  . Drug use: No     Allergies   Azithromycin, Alvesco [ciclesonide], Anoro ellipta [umeclidinium-vilanterol], Banana, and Penicillins    Review of Systems Review of Systems  Constitutional: Negative for chills and fever.  Respiratory: Positive for shortness of breath.   Cardiovascular: Negative for chest pain.     Physical Exam Updated Vital Signs BP 128/74 (BP Location: Right Arm)   Pulse 70   Temp 98.1 F (36.7 C) (Oral)   Resp 18   Ht 6\' 2"  (1.88 m)   Wt 80.7 kg   SpO2 95%   BMI 22.85 kg/m   Physical Exam Vitals signs and nursing note reviewed. Exam conducted with a chaperone present.  Constitutional:      Appearance: Normal appearance.  HENT:     Head: Normocephalic and atraumatic.  Eyes:     General: No scleral icterus.    Conjunctiva/sclera: Conjunctivae normal.  Pulmonary:     Comments: No significantly increased respiratory effort.  No accessory muscle use.  Wheezing auscultated diffusely. Musculoskeletal:     Right lower leg: No edema.     Left lower leg: No edema.  Skin:    General: Skin is dry.  Neurological:     Mental Status: He is alert.     GCS: GCS eye subscore is 4. GCS verbal subscore is 5. GCS motor subscore is 6.  Psychiatric:        Mood and Affect: Mood normal.        Behavior: Behavior normal.        Thought Content: Thought content normal.      ED Treatments / Results  Labs (all labs ordered are listed, but only abnormal results are displayed) Labs Reviewed - No data to display  EKG None  Radiology Dg Chest 2 View  Result Date: 03/24/2019 CLINICAL DATA:  Shortness of breath for 1 month EXAM: CHEST - 2 VIEW COMPARISON:  01/11/2018 FINDINGS: Cardiac shadows within normal limits. Mild aortic calcifications are seen. The lungs are hyperinflated consistent with COPD. Mild increased bronchial markings are noted bilaterally consistent with the given clinical history of asthma. IMPRESSION: COPD without acute abnormality. Mild bronchitic markings are noted consistent with the given clinical history of underlying asthma. Electronically Signed   By: Alcide CleverMark  Lukens M.D.   On:  03/24/2019 20:39    Procedures Procedures (including critical care time)  Medications Ordered in ED Medications  albuterol (VENTOLIN HFA) 108 (90 Base) MCG/ACT inhaler 6 puff (6 puffs Inhalation Given 03/24/19 2140)  predniSONE (DELTASONE) tablet 60 mg (60 mg Oral Given 03/24/19 2139)     Initial Impression / Assessment and Plan / ED Course  I have reviewed the triage vital signs and the nursing notes.  Pertinent labs & imaging results that were available during my care of the patient were reviewed by me and considered in my medical decision making (see chart for details).        Patient's vital signs are within normal limits and he appears to be in no respiratory distress.  While he endorses mild pleuritic try cough, he states that that is common for his asthma exacerbations.  He denies any and all other symptoms.  Patient is PERC negative.  Low suspicion for pulmonary embolism.  DG chest obtained demonstrated no focal consolidation concerning for pneumonia or evidence of pneumothorax.  Patient's shortness of breath improved with prednisone and albuterol.  We do not have ipratropium available here in the ED.  Patient reports that he has had success with prednisone burst in the past.  Will prescribe 5-day course of prednisone.  Instructed patient to follow-up with with his pulmonologist for ongoing evaluation and management of his severe, persistent asthma.  All of the evaluation and work-up results were discussed with the patient and any family at bedside. They were provided opportunity to ask any additional questions and have none at this time. They have expressed understanding of verbal discharge instructions as well as return precautions and are agreeable to the plan.    Final Clinical Impressions(s) / ED Diagnoses   Final diagnoses:  Severe persistent asthma with exacerbation    ED Discharge Orders         Ordered    predniSONE (DELTASONE) 50 MG tablet  Daily with breakfast      03/24/19 2228           Corena Herter, PA-C 03/24/19 2245    Milton Ferguson, MD 03/24/19 2258

## 2019-04-02 DIAGNOSIS — J45909 Unspecified asthma, uncomplicated: Secondary | ICD-10-CM | POA: Diagnosis not present

## 2019-04-02 DIAGNOSIS — Z713 Dietary counseling and surveillance: Secondary | ICD-10-CM | POA: Diagnosis not present

## 2019-04-02 DIAGNOSIS — Z299 Encounter for prophylactic measures, unspecified: Secondary | ICD-10-CM | POA: Diagnosis not present

## 2019-04-02 DIAGNOSIS — R251 Tremor, unspecified: Secondary | ICD-10-CM | POA: Diagnosis not present

## 2019-04-02 DIAGNOSIS — Z6823 Body mass index (BMI) 23.0-23.9, adult: Secondary | ICD-10-CM | POA: Diagnosis not present

## 2019-04-05 ENCOUNTER — Emergency Department (HOSPITAL_COMMUNITY): Payer: BC Managed Care – PPO

## 2019-04-05 ENCOUNTER — Encounter (HOSPITAL_COMMUNITY): Payer: Self-pay | Admitting: Emergency Medicine

## 2019-04-05 ENCOUNTER — Other Ambulatory Visit: Payer: Self-pay

## 2019-04-05 ENCOUNTER — Emergency Department (HOSPITAL_COMMUNITY)
Admission: EM | Admit: 2019-04-05 | Discharge: 2019-04-05 | Disposition: A | Discharge status: Home / Self Care | Payer: BC Managed Care – PPO | Attending: Emergency Medicine | Admitting: Emergency Medicine

## 2019-04-05 DIAGNOSIS — Z79899 Other long term (current) drug therapy: Secondary | ICD-10-CM | POA: Diagnosis not present

## 2019-04-05 DIAGNOSIS — R0602 Shortness of breath: Secondary | ICD-10-CM | POA: Diagnosis not present

## 2019-04-05 DIAGNOSIS — Z20828 Contact with and (suspected) exposure to other viral communicable diseases: Secondary | ICD-10-CM | POA: Diagnosis not present

## 2019-04-05 DIAGNOSIS — Z87891 Personal history of nicotine dependence: Secondary | ICD-10-CM | POA: Diagnosis not present

## 2019-04-05 DIAGNOSIS — J4551 Severe persistent asthma with (acute) exacerbation: Secondary | ICD-10-CM | POA: Insufficient documentation

## 2019-04-05 LAB — POC SARS CORONAVIRUS 2 AG -  ED: SARS Coronavirus 2 Ag: NEGATIVE

## 2019-04-05 MED ORDER — MAGNESIUM SULFATE 2 GM/50ML IV SOLN
2.0000 g | Freq: Once | INTRAVENOUS | Status: AC
  Administered 2019-04-05: 2 g via INTRAVENOUS
  Filled 2019-04-05: qty 50

## 2019-04-05 MED ORDER — IPRATROPIUM-ALBUTEROL 0.5-2.5 (3) MG/3ML IN SOLN
3.0000 mL | Freq: Once | RESPIRATORY_TRACT | Status: AC
  Administered 2019-04-05: 3 mL via RESPIRATORY_TRACT
  Filled 2019-04-05: qty 3

## 2019-04-05 MED ORDER — ALBUTEROL SULFATE (2.5 MG/3ML) 0.083% IN NEBU
2.5000 mg | INHALATION_SOLUTION | Freq: Once | RESPIRATORY_TRACT | Status: AC
  Administered 2019-04-05: 2.5 mg via RESPIRATORY_TRACT
  Filled 2019-04-05: qty 3

## 2019-04-05 MED ORDER — PREDNISONE 50 MG PO TABS
60.0000 mg | ORAL_TABLET | Freq: Once | ORAL | Status: AC
  Administered 2019-04-05: 60 mg via ORAL
  Filled 2019-04-05: qty 1

## 2019-04-05 MED ORDER — PREDNISONE 50 MG PO TABS: 50.0000 mg | ORAL_TABLET | Freq: Every day | ORAL | 0 refills | Status: DC

## 2019-04-05 MED ORDER — ALBUTEROL (5 MG/ML) CONTINUOUS INHALATION SOLN
10.0000 mg/h | INHALATION_SOLUTION | Freq: Once | RESPIRATORY_TRACT | Status: AC
  Administered 2019-04-05: 10 mg/h via RESPIRATORY_TRACT
  Filled 2019-04-05: qty 20

## 2019-04-05 NOTE — ED Triage Notes (Signed)
Pt c/o sob from his asthma with wheezing since last night. Has done inhalers and neb treatments with no relief. No resp distress noted. Pt c/o pain to left lower back "from trying to catch my breath". Pt able to complete sentences

## 2019-04-05 NOTE — ED Provider Notes (Signed)
[facility] EMERGENCY DEPARTMENT Provider Note   CSN: [id number] Arrival date & time: 04/05/19  1707     History   Chief Complaint Chief Complaint  Patient presents with  . Shortness of Breath    HPI Drew Sandoval is a 43 y.o. male with a history of asthma (under the care of Dr. Maple Hudson), also h/o SVT and allergy presenting for worsening asthma flare since he was exposed to smoke while at work 5 nights ago.  He has treated his symptoms with his regular medicines including singulair and trelegy and has used his albuterol mdi and his albuterol neb tx most recently this morning after waking after his overnight shift with no improvement in sob and wheeze.  He denies chest pain.  Also no fever or chills. His cough has been nonproductive. He reports worsened sob with exertion.  He denies swelling in his legs, denies pleuritic pain, denies orthopnea.  Also no known exposure to covid.      The history is provided by the patient.    Past Medical History:  Diagnosis Date  . Asthma   . Seasonal allergies     Patient Active Problem List   Diagnosis Date Noted  . Hypokalemia 03/10/2016  . Hypoxia 10/28/2014  . Acute conjunctivitis of right eye 10/07/2014  . Sustained SVT (HCC) 09/24/2014  . SVT (supraventricular tachycardia) (HCC) 09/24/2014  . Malnutrition of moderate degree (HCC) 09/24/2014  . Underweight 09/24/2014  . Seasonal and perennial allergic rhinitis 07/17/2014  . Acute respiratory failure with hypoxia (HCC) 07/23/2013  . Asthma exacerbation 06/27/2013  . 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 Eczema, allergic 07/22/2011  . Status asthmaticus 10/19/2010  . Sinusitis, maxillary, 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  . 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 Asthma, severe persistent 07/16/2010    Past Surgical History:  Procedure Laterality Date  . None          Home Medications    Prior to Admission medications   Medication Sig Start Date End Date Taking? Authorizing Provider   albuterol (PROVENTIL) (2.5 MG/3ML) 0.083% nebulizer solution Take 3 mLs (2.5 mg total) by nebulization every 6 (six) hours as needed for wheezing or shortness of breath. 05/27/17  Yes Jetty Duhamel D, MD  albuterol (VENTOLIN HFA) 108 (90 Base) MCG/ACT inhaler TAKE 2 PUFFS BY MOUTH EVERY 4 HOURS AS NEEDED 03/13/19  Yes Young, Clinton D, MD  montelukast (SINGULAIR) 10 MG tablet TAKE 1 TABLET BY MOUTH EVERYDAY AT BEDTIME 02/14/19  Yes Young, Joni Fears D, MD  predniSONE (DELTASONE) 50 MG tablet Take 1 tablet (50 mg total) by mouth daily with breakfast. 03/24/19  Yes Lorelee New, PA-C  primidone (MYSOLINE) 50 MG tablet Take 1 tablet (50 mg total) by mouth 2 (two) times daily. 03/20/19  Yes Tat, Octaviano Batty, DO  Reslizumab (CINQAIR IV) Inject 228.3 mg into the vein every 30 (thirty) days.   Yes [provider]  TRELEGY ELLIPTA 100-62.5-25 MCG/INH AEPB INHALE 1 PUFF INTO THE LUNGS DAILY. RINSE MOUTH 02/22/19  Yes Young, Clinton D, MD  EPINEPHrine 0.3 mg/0.3 mL IJ SOAJ injection Inject into thigh for severe allergic reaction Patient taking differently: Inject 0.3 mg into the muscle once. Inject into thigh for severe allergic reaction 04/29/15   Jetty Duhamel D, MD  ibuprofen (ADVIL,MOTRIN) 800 MG tablet Take 1 tablet (800 mg total) by mouth every 8 (eight) hours as needed for moderate pain. 12/15/17   Terrilee Files, MD    Family History Family History  Problem Relation Age of Onset  .  Emphysema Father   . Lung cancer Mother   . Arthritis Sister     Social History Social History   Tobacco Use  . Smoking status: Former Smoker    Packs/day: 0.50    Years: 11.50    Pack years: 5.75    Types: Cigarettes    Quit date: 04/09/2010    Years since quitting: 8.9  . Smokeless tobacco: Former NeurosurgeonUser  . Tobacco comment: started at age [age].  1/2 ppd.    Substance Use Topics  . Alcohol use: Yes    Comment: rarely  . Drug use: No     Allergies   Azithromycin, Alvesco [ciclesonide], Anoro  ellipta [umeclidinium-vilanterol], Banana, and Penicillins   Review of Systems Review of Systems  Constitutional: Negative for chills and fever.  HENT: Negative for congestion and sore throat.   Eyes: Negative.   Respiratory: Positive for cough, chest tightness, shortness of breath and wheezing.   Cardiovascular: Negative for chest pain, palpitations and leg swelling.  Gastrointestinal: Negative for abdominal pain, nausea and vomiting.  Genitourinary: Negative.   Musculoskeletal: Negative for arthralgias, joint swelling and neck pain.  Skin: Negative.  Negative for rash and wound.  Neurological: Negative for dizziness, weakness, light-headedness, numbness and headaches.  Psychiatric/Behavioral: Negative.   All other systems reviewed and are negative.    Physical Exam Updated Vital Signs BP 125/86   Pulse 71   Temp 98.4 F (36.9 C) (Oral)   Resp 18   Ht 6\' 2"  (1.88 m)   Wt 81.6 kg   SpO2 95%   BMI 23.11 kg/m   Physical Exam Vitals signs and nursing note reviewed.  Constitutional:      Appearance: He is well-developed.  HENT:     Head: Normocephalic and atraumatic.  Eyes:     Conjunctiva/sclera: Conjunctivae normal.  Neck:     Musculoskeletal: Normal range of motion.  Cardiovascular:     Rate and Rhythm: Normal rate and regular rhythm.     Heart sounds: Normal heart sounds.  Pulmonary:     Effort: Pulmonary effort is normal. No accessory muscle usage.     Breath sounds: No stridor. Decreased breath sounds present. No wheezing.     Comments: Decreased breath sounds all lung fields. No wheezing. No rhonchi. Abdominal:     General: Bowel sounds are normal.     Palpations: Abdomen is soft.     Tenderness: There is no abdominal tenderness.  Musculoskeletal: Normal range of motion.  Skin:    General: Skin is warm and dry.  Neurological:     Mental Status: He is alert.      ED Treatments / Results  Labs (all labs ordered are listed, but only abnormal results  are displayed) Labs Reviewed  POC SARS CORONAVIRUS 2 AG -  ED    EKG None  Radiology No results found.  Procedures Procedures (including critical care time)  Medications Ordered in ED Medications  magnesium sulfate IVPB 2 g 50 mL (has no administration in time range)  ipratropium-albuterol (DUONEB) 0.5-2.5 (3) MG/3ML nebulizer solution 3 mL (3 mLs Nebulization Given 04/05/19 1855)  albuterol (PROVENTIL) (2.5 MG/3ML) 0.083% nebulizer solution 2.5 mg (2.5 mg Nebulization Given 04/05/19 1854)  predniSONE (DELTASONE) tablet 60 mg (60 mg Oral Given 04/05/19 1849)  albuterol (PROVENTIL,VENTOLIN) solution continuous neb (10 mg/hr Nebulization Given 04/05/19 1954)     Initial Impression / Assessment and Plan / ED Course  I have reviewed the triage vital signs and the nursing notes.  Pertinent  labs & imaging results that were available during my care of the patient were reviewed by me and considered in my medical decision making (see chart for details).        Pt was given albuterol/atrovent neb tx after rapid Covid test was negative.  Repeat exam - pt with slightly more aeration and has expiratory wheeze right mid field.  Repeated albuterol 10 mg over 1 hour.  Pt ambulated after this, pulse ox stayed at 90-92%.  Repeat exam with reduced aeration still, no active wheezing.  Will give IV magnesium,  CXR ordered given no sig improvement with neb tx.  Will probably require additional neb tx, consider admission if pt does not improve.  Discussed with Dr. Wilson Singer who assumes care.   Final Clinical Impressions(s) / ED Diagnoses   Final diagnoses:  Severe persistent asthma with acute exacerbation    ED Discharge Orders    None       Landis Martins 04/05/19 2152    Virgel Manifold, MD 04/06/19 2035

## 2019-04-05 NOTE — ED Notes (Signed)
Pt went to restroom and when he returned to room he was 92% we walked around the nurses station and o2 dropped to 90%. Pt states his chest feels tight, but he had no obvious sob. PA notified.

## 2019-04-12 ENCOUNTER — Inpatient Hospital Stay (HOSPITAL_COMMUNITY): Admission: RE | Admit: 2019-04-12 | Payer: BC Managed Care – PPO | Source: Ambulatory Visit

## 2019-04-13 ENCOUNTER — Encounter (HOSPITAL_COMMUNITY): Payer: Self-pay

## 2019-04-13 ENCOUNTER — Other Ambulatory Visit: Payer: Self-pay

## 2019-04-13 ENCOUNTER — Encounter (HOSPITAL_COMMUNITY)
Admission: RE | Admit: 2019-04-13 | Discharge: 2019-04-13 | Disposition: A | Discharge status: Home / Self Care | Payer: BC Managed Care – PPO | Source: Ambulatory Visit | Attending: Internal Medicine | Admitting: Internal Medicine

## 2019-04-13 DIAGNOSIS — J4550 Severe persistent asthma, uncomplicated: Secondary | ICD-10-CM | POA: Diagnosis not present

## 2019-04-13 MED ORDER — SODIUM CHLORIDE 0.9 % IV SOLN
Freq: Once | INTRAVENOUS | Status: AC
  Administered 2019-04-13: 09:00:00 via INTRAVENOUS

## 2019-04-13 MED ORDER — SODIUM CHLORIDE 0.9 % IV SOLN
228.3000 mg | Freq: Once | INTRAVENOUS | Status: AC
  Administered 2019-04-13: 09:00:00 228.3 mg via INTRAVENOUS
  Filled 2019-04-13: qty 22.83

## 2019-04-16 ENCOUNTER — Telehealth: Payer: Self-pay | Admitting: Internal Medicine

## 2019-04-16 NOTE — Telephone Encounter (Signed)
LMTCB

## 2019-04-16 NOTE — Telephone Encounter (Signed)
Spoke with the pt  He is c/o increased SOB, wheezing and dry cough for the past month  He states he went to ED x 2 in Nov 2020 for this and did not improve  He denies any fever, chills, body aches, sore throat, sick contacts or recent travel He wants something called in  He states also that his PCP put him on primidone for a tremor and he read that this could make his breathing worse but his PCP told him that it would not He wants to know if Dr Annamaria Boots thinks he should stop taking this  Please advise thanks!  Allergies  Allergen Reactions  . Azithromycin Rash  . Alvesco [Ciclesonide]     Possible reaction- rash  . Anoro Ellipta [Umeclidinium-Vilanterol]     Possible- rash  . Banana Swelling    Throat swelling   . Penicillins Rash    Has patient had a PCN reaction causing immediate rash, facial/tongue/throat swelling, SOB or lightheadedness with hypotension: Yes Has patient had a PCN reaction causing severe rash involving mucus membranes or skin necrosis: No Has patient had a PCN reaction that required hospitalization Yes Has patient had a PCN reaction occurring within the last 10 years: No If all of the above answers are "NO", then may proceed with Cephalosporin use.     Current Outpatient Medications on File Prior to Visit  Medication Sig Dispense Refill  . albuterol (PROVENTIL) (2.5 MG/3ML) 0.083% nebulizer solution Take 3 mLs (2.5 mg total) by nebulization every 6 (six) hours as needed for wheezing or shortness of breath. 360 mL 5  . albuterol (VENTOLIN HFA) 108 (90 Base) MCG/ACT inhaler TAKE 2 PUFFS BY MOUTH EVERY 4 HOURS AS NEEDED 8.5 g 5  . EPINEPHrine 0.3 mg/0.3 mL IJ SOAJ injection Inject into thigh for severe allergic reaction (Patient taking differently: Inject 0.3 mg into the muscle once. Inject into thigh for severe allergic reaction) 1 Device prn  . ibuprofen (ADVIL,MOTRIN) 800 MG tablet Take 1 tablet (800 mg total) by mouth every 8 (eight) hours as needed for moderate  pain. 21 tablet 0  . montelukast (SINGULAIR) 10 MG tablet TAKE 1 TABLET BY MOUTH EVERYDAY AT BEDTIME 90 tablet 3  . predniSONE (DELTASONE) 50 MG tablet Take 1 tablet (50 mg total) by mouth daily with breakfast. 5 tablet 0  . predniSONE (DELTASONE) 50 MG tablet Take 1 tablet (50 mg total) by mouth daily. 5 tablet 0  . primidone (MYSOLINE) 50 MG tablet Take 1 tablet (50 mg total) by mouth 2 (two) times daily. 180 tablet 1  . Reslizumab (CINQAIR IV) Inject 228.3 mg into the vein every 30 (thirty) days.    . TRELEGY ELLIPTA 100-62.5-25 MCG/INH AEPB INHALE 1 PUFF INTO THE LUNGS DAILY. RINSE MOUTH 60 each 3   No current facility-administered medications on file prior to visit.

## 2019-04-16 NOTE — Telephone Encounter (Signed)
Spoke with patient. He is aware of CY's recommendations. Nothing further needed at time of call.

## 2019-04-16 NOTE — Telephone Encounter (Signed)
Pt is returning call & can be reached at [phone].

## 2019-04-16 NOTE — Telephone Encounter (Signed)
Shortness of breath and asthma are not common side effects of primodone. If he wants to be sure, he could choose to stop it for a week or two, then try it back and see what he can tell.

## 2019-04-17 ENCOUNTER — Inpatient Hospital Stay (HOSPITAL_COMMUNITY)
Admission: EM | Admit: 2019-04-17 | Discharge: 2019-04-19 | DRG: 202 | Disposition: A | Discharge status: Home / Self Care | Payer: BC Managed Care – PPO | Attending: Family Medicine | Admitting: Family Medicine

## 2019-04-17 ENCOUNTER — Emergency Department (HOSPITAL_COMMUNITY): Payer: BC Managed Care – PPO

## 2019-04-17 ENCOUNTER — Other Ambulatory Visit: Payer: Self-pay

## 2019-04-17 ENCOUNTER — Encounter (HOSPITAL_COMMUNITY): Payer: Self-pay | Admitting: Emergency Medicine

## 2019-04-17 DIAGNOSIS — Z91018 Allergy to other foods: Secondary | ICD-10-CM | POA: Diagnosis not present

## 2019-04-17 DIAGNOSIS — Z825 Family history of asthma and other chronic lower respiratory diseases: Secondary | ICD-10-CM

## 2019-04-17 DIAGNOSIS — J9601 Acute respiratory failure with hypoxia: Secondary | ICD-10-CM | POA: Diagnosis present

## 2019-04-17 DIAGNOSIS — J4551 Severe persistent asthma with (acute) exacerbation: Secondary | ICD-10-CM | POA: Diagnosis not present

## 2019-04-17 DIAGNOSIS — Z87891 Personal history of nicotine dependence: Secondary | ICD-10-CM

## 2019-04-17 DIAGNOSIS — E876 Hypokalemia: Secondary | ICD-10-CM | POA: Diagnosis not present

## 2019-04-17 DIAGNOSIS — Z881 Allergy status to other antibiotic agents status: Secondary | ICD-10-CM

## 2019-04-17 DIAGNOSIS — R0902 Hypoxemia: Secondary | ICD-10-CM | POA: Diagnosis not present

## 2019-04-17 DIAGNOSIS — Z20828 Contact with and (suspected) exposure to other viral communicable diseases: Secondary | ICD-10-CM | POA: Diagnosis present

## 2019-04-17 DIAGNOSIS — Z888 Allergy status to other drugs, medicaments and biological substances status: Secondary | ICD-10-CM

## 2019-04-17 DIAGNOSIS — J4541 Moderate persistent asthma with (acute) exacerbation: Secondary | ICD-10-CM | POA: Diagnosis not present

## 2019-04-17 DIAGNOSIS — Z79899 Other long term (current) drug therapy: Secondary | ICD-10-CM | POA: Diagnosis not present

## 2019-04-17 DIAGNOSIS — R05 Cough: Secondary | ICD-10-CM | POA: Diagnosis not present

## 2019-04-17 DIAGNOSIS — J45901 Unspecified asthma with (acute) exacerbation: Secondary | ICD-10-CM | POA: Diagnosis present

## 2019-04-17 DIAGNOSIS — R0602 Shortness of breath: Secondary | ICD-10-CM | POA: Diagnosis not present

## 2019-04-17 DIAGNOSIS — Z880 Allergy status to penicillin: Secondary | ICD-10-CM | POA: Diagnosis not present

## 2019-04-17 DIAGNOSIS — Z7952 Long term (current) use of systemic steroids: Secondary | ICD-10-CM

## 2019-04-17 LAB — CBC WITH DIFFERENTIAL/PLATELET
Abs Immature Granulocytes: 0.03 10*3/uL (ref 0.00–0.07)
Basophils Absolute: 0 10*3/uL (ref 0.0–0.1)
Basophils Relative: 0 %
Eosinophils Absolute: 0 10*3/uL (ref 0.0–0.5)
Eosinophils Relative: 0 %
HCT: 43.6 % (ref 39.0–52.0)
Hemoglobin: 13.9 g/dL (ref 13.0–17.0)
Immature Granulocytes: 1 %
Lymphocytes Relative: 6 %
Lymphs Abs: 0.4 10*3/uL — ABNORMAL LOW (ref 0.7–4.0)
MCH: 29.7 pg (ref 26.0–34.0)
MCHC: 31.9 g/dL (ref 30.0–36.0)
MCV: 93.2 fL (ref 80.0–100.0)
Monocytes Absolute: 0.1 10*3/uL (ref 0.1–1.0)
Monocytes Relative: 1 %
Neutro Abs: 5.5 10*3/uL (ref 1.7–7.7)
Neutrophils Relative %: 92 %
Platelets: 229 10*3/uL (ref 150–400)
RBC: 4.68 MIL/uL (ref 4.22–5.81)
RDW: 14.1 % (ref 11.5–15.5)
WBC: 6 10*3/uL (ref 4.0–10.5)
nRBC: 0 % (ref 0.0–0.2)

## 2019-04-17 LAB — RESPIRATORY PANEL BY RT PCR (FLU A&B, COVID)
Influenza A by PCR: NEGATIVE
Influenza B by PCR: NEGATIVE
SARS Coronavirus 2 by RT PCR: NEGATIVE

## 2019-04-17 LAB — BASIC METABOLIC PANEL
Anion gap: 10 (ref 5–15)
BUN: 16 mg/dL (ref 6–20)
CO2: 20 mmol/L — ABNORMAL LOW (ref 22–32)
Calcium: 8.6 mg/dL — ABNORMAL LOW (ref 8.9–10.3)
Chloride: 112 mmol/L — ABNORMAL HIGH (ref 98–111)
Creatinine, Ser: 0.83 mg/dL (ref 0.61–1.24)
GFR calc Af Amer: >60 mL/min (ref >60)
GFR calc non Af Amer: >60 mL/min (ref >60)
Glucose, Bld: 145 mg/dL — ABNORMAL HIGH (ref 70–99)
Potassium: 3.3 mmol/L — ABNORMAL LOW (ref 3.5–5.1)
Sodium: 142 mmol/L (ref 135–145)

## 2019-04-17 MED ORDER — ALBUTEROL SULFATE HFA 108 (90 BASE) MCG/ACT IN AERS
4.0000 | INHALATION_SPRAY | Freq: Once | RESPIRATORY_TRACT | Status: AC
  Administered 2019-04-17: 4 via RESPIRATORY_TRACT
  Filled 2019-04-17: qty 6.7

## 2019-04-17 MED ORDER — PRIMIDONE 50 MG PO TABS
50.0000 mg | ORAL_TABLET | Freq: Two times a day (BID) | ORAL | Status: DC
  Administered 2019-04-18 – 2019-04-19 (×4): 50 mg via ORAL
  Filled 2019-04-17 (×6): qty 1

## 2019-04-17 MED ORDER — MAGNESIUM SULFATE 2 GM/50ML IV SOLN
2.0000 g | Freq: Once | INTRAVENOUS | Status: AC
  Administered 2019-04-17: 2 g via INTRAVENOUS
  Filled 2019-04-17: qty 50

## 2019-04-17 MED ORDER — METHYLPREDNISOLONE SODIUM SUCC 125 MG IJ SOLR
125.0000 mg | Freq: Once | INTRAMUSCULAR | Status: AC
  Administered 2019-04-17: 125 mg via INTRAVENOUS
  Filled 2019-04-17: qty 2

## 2019-04-17 MED ORDER — ALBUTEROL SULFATE (2.5 MG/3ML) 0.083% IN NEBU
2.5000 mg | INHALATION_SOLUTION | Freq: Four times a day (QID) | RESPIRATORY_TRACT | Status: DC
  Administered 2019-04-17: 2.5 mg via RESPIRATORY_TRACT
  Filled 2019-04-17: qty 3

## 2019-04-17 MED ORDER — PREDNISONE 20 MG PO TABS
50.0000 mg | ORAL_TABLET | Freq: Every day | ORAL | Status: DC
  Administered 2019-04-17 – 2019-04-19 (×3): 50 mg via ORAL
  Filled 2019-04-17 (×2): qty 1

## 2019-04-17 MED ORDER — ACETAMINOPHEN 325 MG PO TABS: 650.0000 mg | ORAL_TABLET | Freq: Four times a day (QID) | ORAL | Status: DC | PRN

## 2019-04-17 MED ORDER — MONTELUKAST SODIUM 10 MG PO TABS
10.0000 mg | ORAL_TABLET | Freq: Every day | ORAL | Status: DC
  Administered 2019-04-18 (×2): 10 mg via ORAL
  Filled 2019-04-17 (×2): qty 1

## 2019-04-17 MED ORDER — AEROCHAMBER PLUS FLO-VU MISC
1.0000 | Freq: Once | Status: AC
  Administered 2019-04-17: 1
  Filled 2019-04-17: qty 1

## 2019-04-17 MED ORDER — IPRATROPIUM BROMIDE HFA 17 MCG/ACT IN AERS: 2.0000 | INHALATION_SPRAY | Freq: Once | RESPIRATORY_TRACT | Status: DC

## 2019-04-17 MED ORDER — ALBUTEROL (5 MG/ML) CONTINUOUS INHALATION SOLN
10.0000 mg/h | INHALATION_SOLUTION | Freq: Once | RESPIRATORY_TRACT | Status: AC
  Administered 2019-04-17: 10 mg/h via RESPIRATORY_TRACT

## 2019-04-17 MED ORDER — IPRATROPIUM-ALBUTEROL 20-100 MCG/ACT IN AERS
1.0000 | INHALATION_SPRAY | Freq: Once | RESPIRATORY_TRACT | Status: AC
  Administered 2019-04-17: 1 via RESPIRATORY_TRACT
  Filled 2019-04-17: qty 4

## 2019-04-17 MED ORDER — METHYLPREDNISOLONE SODIUM SUCC 125 MG IJ SOLR
60.0000 mg | Freq: Four times a day (QID) | INTRAMUSCULAR | Status: DC
  Filled 2019-04-17: qty 2

## 2019-04-17 MED ORDER — ACETAMINOPHEN 650 MG RE SUPP: 650.0000 mg | Freq: Four times a day (QID) | RECTAL | Status: DC | PRN

## 2019-04-17 MED ORDER — PRIMIDONE 50 MG PO TABS
ORAL_TABLET | ORAL | Status: AC
  Filled 2019-04-17: qty 1

## 2019-04-17 MED ORDER — ALBUTEROL SULFATE (2.5 MG/3ML) 0.083% IN NEBU
INHALATION_SOLUTION | RESPIRATORY_TRACT | Status: AC
  Administered 2019-04-17: 2.5 mg
  Filled 2019-04-17: qty 12

## 2019-04-17 MED ORDER — POTASSIUM CHLORIDE CRYS ER 20 MEQ PO TBCR
40.0000 meq | EXTENDED_RELEASE_TABLET | Freq: Once | ORAL | Status: AC
  Administered 2019-04-18: 40 meq via ORAL
  Filled 2019-04-17: qty 2

## 2019-04-17 MED ORDER — IPRATROPIUM-ALBUTEROL 0.5-2.5 (3) MG/3ML IN SOLN
3.0000 mL | Freq: Four times a day (QID) | RESPIRATORY_TRACT | Status: DC
  Administered 2019-04-17: 3 mL via RESPIRATORY_TRACT
  Filled 2019-04-17 (×2): qty 3

## 2019-04-17 NOTE — ED Provider Notes (Signed)
[facility] EMERGENCY DEPARTMENT Provider Note   CSN: [id number] Arrival date & time: 04/17/19  1039     History   Chief Complaint Chief Complaint  Patient presents with   Asthma    HPI Drew Sandoval is a 43 y.o. male.     Drew Sandoval is a 43 y.o. male with a history of asthma and seasonal allergies, who presents with asthma and worsening shortness of breath since 12/2.  Reports he has been using his albuterol and Trelegy inhalers as well as his nebulizer machine but symptoms continue to worsen and over the past 2 days he has had significant shortness of breath with any activity.  He reports frequent dry cough.  He denies any fevers, rhinorrhea, sore throat.  Denies any vomiting, or diarrhea.  No loss of taste or smell and no known sick contacts.  He reports the symptoms feel consistent with his previous asthma exacerbations, was seen in the ED for similar symptoms on 11/26 and was treated with multiple nebulizer treatments and steroids, was sent home on a course of oral steroids which he reports initially helped his symptoms but shortly after he finished steroid course shortness of breath returned.  He was also seen in the ED on 11/14 with similar symptoms.  He denies any associated chest pain or pleuritic pain.  No other aggravating or alleviating factors.     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 Authorizing Provider  albuterol (PROVENTIL) (2.5 MG/3ML) 0.083% nebulizer solution Take 3 mLs (2.5 mg total) by nebulization every 6 (six) hours as needed for wheezing or shortness of breath. 05/27/17   Jetty Duhamel D, MD  albuterol (VENTOLIN HFA) 108 (90 Base) MCG/ACT inhaler TAKE 2 PUFFS BY MOUTH EVERY 4 HOURS AS NEEDED 03/13/19   Jetty Duhamel D, MD  EPINEPHrine 0.3 mg/0.3 mL IJ SOAJ injection Inject into thigh for severe allergic reaction Patient taking differently: Inject 0.3 mg into the muscle once. Inject into thigh for severe allergic reaction 04/29/15   Jetty Duhamel D, MD  ibuprofen (ADVIL,MOTRIN) 800 MG tablet Take 1 tablet (800 mg total) by mouth every 8 (eight) hours as needed for moderate pain. 12/15/17   Terrilee Files, MD  montelukast (SINGULAIR) 10 MG tablet TAKE 1 TABLET BY MOUTH EVERYDAY AT BEDTIME 02/14/19   Jetty Duhamel D, MD  predniSONE (DELTASONE) 50 MG tablet Take 1 tablet (50 mg total) by mouth daily with breakfast. 03/24/19   Lorelee New, PA-C  predniSONE (DELTASONE) 50 MG tablet Take 1 tablet (50 mg total) by mouth daily. 04/05/19   Raeford Razor, MD  primidone (MYSOLINE) 50 MG tablet Take 1 tablet (50 mg total) by mouth 2 (two) times daily. 03/20/19   Tat, Octaviano Battyebecca S, DO  Reslizumab (CINQAIR IV) Inject 228.3 mg into the vein every 30 (thirty) days.    [provider]  TRELEGY ELLIPTA 100-62.5-25 MCG/INH AEPB INHALE 1 PUFF INTO THE LUNGS DAILY. RINSE MOUTH 02/22/19   Waymon BudgeYoung, Clinton D, MD    Family History Family History  Problem Relation Age of Onset   Emphysema Father    Lung cancer Mother    Arthritis Sister     Social  History Social History   Tobacco Use   Smoking status: Former Smoker    Packs/day: 0.50    Years: 11.50    Pack years: 5.75    Types: Cigarettes    Quit date: 04/09/2010    Years since quitting: 9.0   Smokeless tobacco: Former NeurosurgeonUser   Tobacco comment: started at age 43.  1/2 ppd.    Substance Use Topics   Alcohol use: Yes    Comment: rarely   Drug use: No     Allergies   Azithromycin, Alvesco [ciclesonide], Anoro ellipta [umeclidinium-vilanterol], Banana, and Penicillins   Review of Systems Review of Systems  Constitutional: Negative for chills and fever.  HENT: Negative.   Respiratory: Positive for cough, chest tightness, shortness of breath and wheezing.   Cardiovascular: Negative for chest pain and leg swelling.  Gastrointestinal: Negative for abdominal pain, diarrhea, nausea and vomiting.  Musculoskeletal: Negative for myalgias.  Skin: Negative for color change and rash.  Neurological: Negative for syncope, facial asymmetry and light-headedness.  All other systems reviewed and are negative.    Physical Exam Updated Vital Signs BP 126/82 (BP Location: Right Arm)    Pulse 72    Temp 98.4 F (36.9 C) (Oral)    Resp 18    Ht 6\' 2"  (1.88 m)    Wt 81.6 kg    SpO2 92%    BMI 23.11 kg/m   Physical Exam Vitals signs and nursing note reviewed.  Constitutional:      General: He is not in acute distress.    Appearance: Normal appearance. He is well-developed and normal weight. He is not diaphoretic.  HENT:     Head: Normocephalic and atraumatic.     Nose: Nose normal.     Mouth/Throat:     Mouth: Mucous membranes are moist.     Pharynx: Oropharynx is clear.     Comments: Posterior oropharynx clear and moist. Eyes:     General:        Right eye: No discharge.        Left eye: No discharge.  Neck:     Musculoskeletal: Neck supple.  Cardiovascular:     Rate and Rhythm: Normal rate and regular rhythm.     Pulses: Normal pulses.     Heart sounds: Normal heart  sounds. No murmur. No friction rub. No gallop.   Pulmonary:     Effort: No respiratory distress.     Breath sounds: Wheezing present. No rales.     Comments: Patient with some increased respiratory effort satting around 92% on room air, able to speak in short sentences, lungs sound very tight with very little air movement and some faint expiratory wheezing heard.  No tenderness over the chest wall. Abdominal:     General: Bowel sounds are normal. There is no distension.     Palpations: Abdomen is soft. There is no mass.  Tenderness: There is no abdominal tenderness. There is no guarding.     Comments: Respirations equal and unlabored, patient able to speak in full sentences, lungs clear to auscultation bilaterally  Musculoskeletal:        General: No deformity.  Skin:    General: Skin is warm and dry.     Capillary Refill: Capillary refill takes less than 2 seconds.  Neurological:     Mental Status: He is alert.     Coordination: Coordination normal.     Comments: Speech is clear, able to follow commands CN III-XII intact Normal strength in upper and lower extremities bilaterally including dorsiflexion and plantar flexion, strong and equal grip strength Sensation normal to light and sharp touch Moves extremities without ataxia, coordination intact       ED Treatments / Results  Labs (all labs ordered are listed, but only abnormal results are displayed) Labs Reviewed  BASIC METABOLIC PANEL - Abnormal; Notable for the following components:      Result Value   Potassium 3.3 (*)    Chloride 112 (*)    CO2 20 (*)    Glucose, Bld 145 (*)    Calcium 8.6 (*)    All other components within normal limits  CBC WITH DIFFERENTIAL/PLATELET - Abnormal; Notable for the following components:   Lymphs Abs 0.4 (*)    All other components within normal limits  RESPIRATORY PANEL BY RT PCR (FLU A&B, COVID)    EKG None  Radiology Dg Chest Port 1 View  Result Date: 04/17/2019 CLINICAL  DATA:  Shortness of breath and cough. Asthma. EXAM: PORTABLE CHEST 1 VIEW COMPARISON:  04/05/2019 FINDINGS: Heart size and pulmonary vascularity are normal. Lungs are clear. No effusions. No bone abnormality. IMPRESSION: No active disease. Electronically Signed   By: Lorriane Shire M.D.   On: 04/17/2019 12:33    Procedures Procedures (including critical care time)  Medications Ordered in ED Medications  albuterol (VENTOLIN HFA) 108 (90 Base) MCG/ACT inhaler 4 puff (4 puffs Inhalation Given 04/17/19 1249)  aerochamber plus with mask device 1 each (1 each Other Given 04/17/19 1426)  methylPREDNISolone sodium succinate (SOLU-MEDROL) 125 mg/2 mL injection 125 mg (125 mg Intravenous Given 04/17/19 1329)  magnesium sulfate IVPB 2 g 50 mL (0 g Intravenous Stopped 04/17/19 1403)  Ipratropium-Albuterol (COMBIVENT) respimat 1 puff (1 puff Inhalation Given 04/17/19 1511)  albuterol (PROVENTIL,VENTOLIN) solution continuous neb (10 mg/hr Nebulization Given 04/17/19 1509)  albuterol (PROVENTIL) (2.5 MG/3ML) 0.083% nebulizer solution (2.5 mg  Given 04/17/19 1508)     Initial Impression / Assessment and Plan / ED Course  I have reviewed the triage vital signs and the nursing notes.  Pertinent labs & imaging results that were available during my care of the patient were reviewed by me and considered in my medical decision making (see chart for details).  43 year old male with history of asthma presents with shortness of breath, wheezing and dry cough persistent and worsening since 12/2, has had 2 recent ED visits in November for similar symptoms and reports that after steroids he seems to improve for a few days and then symptoms return.  On arrival he is satting at 92% on room air with some increased work of breathing and lungs sound very tight with very little air movement and some faint expiratory wheezing.  He has no associated chest pain, lower extremity swelling.  He has had a dry cough, had negative Covid  testing during recent ED visit and has not had any known sick  contacts.  No fevers.  I discussed with respiratory therapy and they cannot do nebulizer treatments until patient has had a negative Covid PCR, rapid test ordered because I feel that patient will need nebulizer treatments, and may still require admission given persistent asthma exacerbations.  Will get chest x-ray.  Albuterol and Combivent inhalers ordered as well as mag and IV Solu-Medrol while awaiting Covid test so that nebulizer treatments can be done.  Patient's chest x-ray is clear and for [facility] respiratory panel is negative for Covid, flu and RSV.  After initial breathing treatments patient continues to sound tight with little improvement, increased wheezing noted.  1 hour continuous albuterol treatment ordered.  After continuous albuterol patient has improved air movement, and has good O2 sats at rest but upon ambulating patient drops to 89% and reports worsening shortness of breath.  Discussed with patient that I feel at this point he will require admission especially given that he has had multiple recent ED visits with similar presentations.  Patient is agreeable to this, basic labs ordered and consult placed for hospitalist admission.  Case discussed with Dr. Luberta Robertson with [facility] hospitalist who will see and admit the patient  Final Clinical Impressions(s) / ED Diagnoses   Final diagnoses:  Severe persistent asthma with exacerbation    ED Discharge Orders    None       Legrand Rams 04/17/19 1813    Donnetta Hutching, MD 04/20/19 1824

## 2019-04-17 NOTE — H&P (Addendum)
History and Physical:    Drew Sandoval   BMW:[id number] DOB: 1975/08/29 DOA: 04/17/2019  Referring MD/provider: PA Adelina MingsKelsey PCP: Kirstie PeriShah, Ashish, MD   Patient coming from: Home  Chief Complaint: Persistent shortness of breath  History of Present Illness:   Drew Sandoval is an 43 y.o. male with severe persistent asthma who presents with shortness of breath and feeling tight on and off for the past 5 weeks.  Patient states that since beginning of November he has been treated with 2 courses of steroids with intermittent improvement however shortly after finishing his taper he becomes short of breath again.  Patient states that he completed his last steroid taper approximately a week ago and was doing okay however developed shortness of breath again yesterday.  He believes that he gets short of breath after getting his monthly reslizumab infusion which he did get yesterday.  He states he tried to call his pulmonologist Dr. [name] to discuss this.  There is a note in the chart from Dr. [name] yesterday which states that primidone is not associated with increasing asthma flares but does not mention reslizumab.  Patient denies fevers or chills.  Does have cough which is typical for his asthma.  Cough is nonproductive.  No malaise.  ED Course:  The patient was treated with albuterol for 1 hour but patient states that he still feels tight.  Patient does not like to take Solu-Medrol because he feels it causes itching.  ROS:   ROS   Review of Systems: General: No fever, chills, weight changes Skin: No rashes, lesions, wounds Respiratory: Positive cough,, shortness of breath, no hemoptysis Cardiovascular: No palpitations, chest pain GI: No nausea, vomiting, diarrhea, constipation GU: No dysuria, increased frequency Musculoskeletal: No back pain, joint pain Blood/lymphatics: No easy bruising, bleeding  Past Medical History:   Past Medical History:  Diagnosis Date  . Asthma   . Seasonal  allergies     Past Surgical History:   Past Surgical History:  Procedure Laterality Date  . None      Social History:   Social History   Socioeconomic History  . Marital status: Single    Spouse name: Not on file  . Number of children: Not on file  . Years of education: Not on file  . Highest education level: Not on file  Occupational History  . Occupation: Psychologist, occupationalsanitation    Employer: [employer]  Social Needs  . Financial resource strain: Not on file  . Food insecurity    Worry: Not on file    Inability: Not on file  . Transportation needs    Medical: Not on file    Non-medical: Not on file  Tobacco Use  . Smoking status: Former Smoker    Packs/day: 0.50    Years: 11.50    Pack years: 5.75    Types: Cigarettes    Quit date: 04/09/2010    Years since quitting: 9.0  . Smokeless tobacco: Former NeurosurgeonUser  . Tobacco comment: started at age 43.  1/2 ppd.    Substance and Sexual Activity  . Alcohol use: Yes    Comment: rarely  . Drug use: No  . Sexual activity: Yes    Birth control/protection: None  Lifestyle  . Physical activity    Days per week: Not on file    Minutes per session: Not on file  . Stress: Not on file  Relationships  . Social connections    Talks on phone: Not on file  Gets together: Not on file    Attends religious service: Not on file    Active member of club or organization: Not on file    Attends meetings of clubs or organizations: Not on file    Relationship status: Not on file  . Intimate partner violence    Fear of current or ex partner: Not on file    Emotionally abused: Not on file    Physically abused: Not on file    Forced sexual activity: Not on file  Other Topics Concern  . Not on file  Social History Narrative   Left handed    Allergies   Azithromycin, Alvesco [ciclesonide], Anoro ellipta [umeclidinium-vilanterol], Banana, and Penicillins  Family history:   Family History  Problem Relation Age of Onset  . Emphysema  Father   . Lung cancer Mother   . Arthritis Sister     Current Medications:   Prior to Admission medications   Medication Sig Start Date End Date Taking? Authorizing Provider  albuterol (PROVENTIL) (2.5 MG/3ML) 0.083% nebulizer solution Take 3 mLs (2.5 mg total) by nebulization every 6 (six) hours as needed for wheezing or shortness of breath. 05/27/17   Jetty Duhamel D, MD  albuterol (VENTOLIN HFA) 108 (90 Base) MCG/ACT inhaler TAKE 2 PUFFS BY MOUTH EVERY 4 HOURS AS NEEDED 03/13/19   Jetty Duhamel D, MD  EPINEPHrine 0.3 mg/0.3 mL IJ SOAJ injection Inject into thigh for severe allergic reaction Patient taking differently: Inject 0.3 mg into the muscle once. Inject into thigh for severe allergic reaction 04/29/15   Jetty Duhamel D, MD  ibuprofen (ADVIL,MOTRIN) 800 MG tablet Take 1 tablet (800 mg total) by mouth every 8 (eight) hours as needed for moderate pain. 12/15/17   Terrilee Files, MD  montelukast (SINGULAIR) 10 MG tablet TAKE 1 TABLET BY MOUTH EVERYDAY AT BEDTIME 02/14/19   Jetty Duhamel D, MD  predniSONE (DELTASONE) 50 MG tablet Take 1 tablet (50 mg total) by mouth daily with breakfast. 03/24/19   Lorelee New, PA-C  predniSONE (DELTASONE) 50 MG tablet Take 1 tablet (50 mg total) by mouth daily. 04/05/19   Raeford Razor, MD  primidone (MYSOLINE) 50 MG tablet Take 1 tablet (50 mg total) by mouth 2 (two) times daily. 03/20/19   Tat, Octaviano Batty, DO  Reslizumab (CINQAIR IV) Inject 228.3 mg into the vein every 30 (thirty) days.    [provider]  SILDENAFIL CITRATE PO Take 30-60 mg by mouth daily as needed. 04/16/19   [provider]  TRELEGY ELLIPTA 100-62.5-25 MCG/INH AEPB INHALE 1 PUFF INTO THE LUNGS DAILY. RINSE MOUTH 02/22/19   Jetty Duhamel D, MD    Physical Exam:   Vitals:   04/17/19 1149 04/17/19 1445 04/17/19 1500 04/17/19 1530  BP: 126/82  121/77 122/76  Pulse: 72 76  63  Resp: 18     Temp: 98.4 F (36.9 C)     TempSrc: Oral     SpO2: 92% (!) 89%   99%  Weight: 81.6 kg     Height: 6\' 2"  (1.88 m)        Physical Exam: Blood pressure 122/76, pulse 63, temperature 98.4 F (36.9 C), temperature source Oral, resp. rate 18, height 6\' 2"  (1.88 m), weight 81.6 kg, SpO2 99 %. Gen: Thin  man sitting up on side of stretcher watching TV in no acute respiratory distress.  Patient able to speak in full sentences without difficulty.  No accessory muscle use. Eyes: Sclerae anicteric. Conjunctiva mildly injected. Chest:  Diminished air entry throughout without adventitious sounds.  No wheezes noted.  Good chest excursion bilaterally.   CV: Distant, regular, no audible murmurs. Abdomen: NABS, soft, nondistended, nontender. No tenderness to light or deep palpation. No rebound, no guarding. Extremities: No edema.  Skin: Warm and dry. No rashes, lesions or wounds. Neuro: Alert and oriented times 3; grossly nonfocal.   Data Review:    Labs: Basic Metabolic Panel: Recent Labs  Lab 04/17/19 1731  NA 142  K 3.3*  CL 112*  CO2 20*  GLUCOSE 145*  BUN 16  CREATININE 0.83  CALCIUM 8.6*   Liver Function Tests: No results for input(s): AST, ALT, ALKPHOS, BILITOT, PROT, ALBUMIN in the last 168 hours. No results for input(s): LIPASE, AMYLASE in the last 168 hours. No results for input(s): AMMONIA in the last 168 hours. CBC: Recent Labs  Lab 04/17/19 1731  WBC 6.0  NEUTROABS 5.5  HGB 13.9  HCT 43.6  MCV 93.2  PLT 229   Cardiac Enzymes: No results for input(s): CKTOTAL, CKMB, CKMBINDEX, TROPONINI in the last 168 hours.  BNP (last 3 results) No results for input(s): PROBNP in the last 8760 hours. CBG: No results for input(s): GLUCAP in the last 168 hours.  Urinalysis    Component Value Date/Time   COLORURINE AMBER (A) 12/15/2017 1445   APPEARANCEUR CLOUDY (A) 12/15/2017 1445   LABSPEC 1.020 12/15/2017 1445   PHURINE 6.0 12/15/2017 1445   GLUCOSEU NEGATIVE 12/15/2017 1445   HGBUR LARGE (A) 12/15/2017 1445   BILIRUBINUR NEGATIVE  12/15/2017 1445   KETONESUR NEGATIVE 12/15/2017 1445   PROTEINUR 30 (A) 12/15/2017 1445   UROBILINOGEN 0.2 06/24/2011 0858   NITRITE NEGATIVE 12/15/2017 1445   LEUKOCYTESUR NEGATIVE 12/15/2017 1445      Radiographic Studies: Dg Chest Port 1 View  Result Date: 04/17/2019 CLINICAL DATA:  Shortness of breath and cough. Asthma. EXAM: PORTABLE CHEST 1 VIEW COMPARISON:  04/05/2019 FINDINGS: Heart size and pulmonary vascularity are normal. Lungs are clear. No effusions. No bone abnormality. IMPRESSION: No active disease. Electronically Signed   By: Francene Boyers M.D.   On: 04/17/2019 12:33     Assessment/Plan:   Principal Problem:   Persistent asthma with acute exacerbation Active Problems:   Acute respiratory failure with hypoxia [facility])   Hypoxia  43 year old man with persistent asthma presents with persistent asthma for 4 to 5 weeks.  Patient is still hypoxic with ambulation after treatment in ED with 1 hour of inhaled bronchodilator.  Chest x-ray is negative.  PERSISTENT ASTHMA Patient has diminished air entry bilaterally but is moving air reasonably. No wheezes. Patient speaking in full sentences without difficulty. Patient declines use of Solu-Medrol.  Will start patient on prednisone 50 mg daily. DuoNeb every 6 hours alternating with Solu-Medrol every 6 hours such that he is receiving inhaled bronchodilators every 3 hours for the first 24 hours. Thereafter DuoNebs every 6 hours continued. Can consider consulting Dr. Maple Hudson as an inpatient to assist with patient's fears of reslizumab precipitating asthma flares.  Would also help with transitioning to outpatient plan.  HYPOKALEMIA Will replete and recheck  H/O SVT Follow heart rate and symptoms.    Other information:   DVT prophylaxis: Early ambulation. Code Status: Full code. Family Communication: Patient states his wife knows he is in house. Disposition Plan: Home Consults called: None Admission status: Inpatient    The medical decision making is of moderate complexity, therefore this is a level 2 visit.  Horatio Pel Orma Flaming [facility]  If 7PM-7AM,  please contact night-coverage [email or web address] Password [facility] 04/17/2019, 6:25 PM

## 2019-04-17 NOTE — ED Triage Notes (Signed)
Pt states he has asthma and has had shortness of breath since last Wednesday.  Has used nebs and inhalers with no relief.

## 2019-04-17 NOTE — ED Notes (Signed)
Assist patient ambuating in hall way patient ambulating in hall  Patient oxygen  Was 99 drop down 89

## 2019-04-17 NOTE — ED Notes (Signed)
Call for report  Call on prolonged hold

## 2019-04-18 DIAGNOSIS — J4551 Severe persistent asthma with (acute) exacerbation: Principal | ICD-10-CM

## 2019-04-18 DIAGNOSIS — R0902 Hypoxemia: Secondary | ICD-10-CM

## 2019-04-18 DIAGNOSIS — J9601 Acute respiratory failure with hypoxia: Secondary | ICD-10-CM

## 2019-04-18 LAB — BASIC METABOLIC PANEL
Anion gap: 8 (ref 5–15)
BUN: 18 mg/dL (ref 6–20)
CO2: 23 mmol/L (ref 22–32)
Calcium: 8.9 mg/dL (ref 8.9–10.3)
Chloride: 111 mmol/L (ref 98–111)
Creatinine, Ser: 0.76 mg/dL (ref 0.61–1.24)
GFR calc Af Amer: >60 mL/min (ref >60)
GFR calc non Af Amer: >60 mL/min (ref >60)
Glucose, Bld: 121 mg/dL — ABNORMAL HIGH (ref 70–99)
Potassium: 4.1 mmol/L (ref 3.5–5.1)
Sodium: 142 mmol/L (ref 135–145)

## 2019-04-18 LAB — HIV ANTIBODY (ROUTINE TESTING W REFLEX): HIV Screen 4th Generation wRfx: NONREACTIVE

## 2019-04-18 MED ORDER — FLUTICASONE-UMECLIDIN-VILANT 100-62.5-25 MCG/INH IN AEPB: 1.0000 | INHALATION_SPRAY | Freq: Every day | RESPIRATORY_TRACT | Status: DC

## 2019-04-18 MED ORDER — ALBUTEROL SULFATE (2.5 MG/3ML) 0.083% IN NEBU
2.5000 mg | INHALATION_SOLUTION | RESPIRATORY_TRACT | Status: DC
  Administered 2019-04-18 (×4): 2.5 mg via RESPIRATORY_TRACT
  Filled 2019-04-18 (×4): qty 3

## 2019-04-18 MED ORDER — UMECLIDINIUM-VILANTEROL 62.5-25 MCG/INH IN AEPB
1.0000 | INHALATION_SPRAY | Freq: Every day | RESPIRATORY_TRACT | Status: DC
  Administered 2019-04-18 – 2019-04-19 (×2): 1 via RESPIRATORY_TRACT
  Filled 2019-04-18: qty 14

## 2019-04-18 MED ORDER — ALBUTEROL SULFATE (2.5 MG/3ML) 0.083% IN NEBU
2.5000 mg | INHALATION_SOLUTION | Freq: Four times a day (QID) | RESPIRATORY_TRACT | Status: DC
  Administered 2019-04-19: 2.5 mg via RESPIRATORY_TRACT
  Filled 2019-04-18: qty 3

## 2019-04-18 MED ORDER — ALBUTEROL SULFATE (2.5 MG/3ML) 0.083% IN NEBU: 2.5000 mg | INHALATION_SOLUTION | RESPIRATORY_TRACT | Status: DC | PRN

## 2019-04-18 MED ORDER — BUDESONIDE 0.25 MG/2ML IN SUSP
0.2500 mg | Freq: Two times a day (BID) | RESPIRATORY_TRACT | Status: DC
  Administered 2019-04-18 (×2): 0.25 mg via RESPIRATORY_TRACT
  Filled 2019-04-18 (×2): qty 2

## 2019-04-18 NOTE — Consult Note (Signed)
Consult requested by: [facility], Dr. Wynetta Emery Consult requested for: Asthma  HPI: This is a 43 year old who has had asthma since 2011.  He did not have any known family history of asthma before that time and he did not have any personal history of any sort of lung trouble before that time.  He was extremely sick in 2011 and eventually required intubation and mechanical ventilation.  He has been being followed by Dr. Keturah Barre in [place] and has been on infusion Biologics.  He says it seems to bother him now.  He took it for about a year last year and did very well but since the start of this year every time he takes his infusion he seems to have more trouble.  He has not had to be in the hospital since 2015.  He says he is still short of breath and wheezing.  He does feel better than on admission and rates himself at a 6 out of 10.  He is coughing and says he has a chronic cough.  He does have some exposure at his workplace.  No chest pain.  No fever chills nausea vomiting diarrhea.  He does have a family history of lung disease.  He is a non-smoker  Past Medical History:  Diagnosis Date  . Asthma   . Seasonal allergies      Family History  Problem Relation Age of Onset  . Emphysema Father   . Lung cancer Mother   . Arthritis Sister      Social History   Socioeconomic History  . Marital status: Single    Spouse name: Not on file  . Number of children: Not on file  . Years of education: Not on file  . Highest education level: Not on file  Occupational History  . Occupation: Financial trader: [employer]  Social Needs  . Financial resource strain: Not on file  . Food insecurity    Worry: Not on file    Inability: Not on file  . Transportation needs    Medical: Not on file    Non-medical: Not on file  Tobacco Use  . Smoking status: Former Smoker    Packs/day: 0.50    Years: 11.50    Pack years: 5.75    Types: Cigarettes    Quit date: 04/09/2010    Years  since quitting: 9.0  . Smokeless tobacco: Former Systems developer  . Tobacco comment: started at age 71.  1/2 ppd.    Substance and Sexual Activity  . Alcohol use: Yes    Comment: rarely  . Drug use: No  . Sexual activity: Yes    Birth control/protection: None  Lifestyle  . Physical activity    Days per week: Not on file    Minutes per session: Not on file  . Stress: Not on file  Relationships  . Social Herbalist on phone: Not on file    Gets together: Not on file    Attends religious service: Not on file    Active member of club or organization: Not on file    Attends meetings of clubs or organizations: Not on file    Relationship status: Not on file  Other Topics Concern  . Not on file  Social History Narrative   Left handed     ROS: Except as mentioned above 10 point review of systems is negative    Objective: Vital signs in last 24 hours: Temp:  [97.3 F (  36.3 C)-98.4 F (36.9 C)] 98.2 F (36.8 C) (12/09 0539) Pulse Rate:  [63-87] 78 (12/09 0539) Resp:  [16-20] 16 (12/09 0539) BP: (121-143)/(69-97) 122/79 (12/09 0539) SpO2:  [89 %-100 %] 95 % (12/09 0539) FiO2 (%):  [21 %] 21 % (12/08 1912) Weight:  [77.3 kg-81.6 kg] 77.3 kg (12/08 2102) Weight change:     Intake/Output from previous day: No intake/output data recorded.  PHYSICAL EXAM Constitutional: He is awake and alert and in no acute distress.  Eyes: Pupils react.  EOMI.  Ears nose mouth and throat: Mucous membranes are moist.  Hearing is grossly normal.  Throat is clear.  Cardiovascular: His heart is regular with normal heart sounds.  Respiratory: His respiratory effort is normal and his lungs are clear gastrointestinal: His abdomen soft with no masses.  Musculoskeletal: Normal strength bilaterally neurological: No focal abnormalities.  Psychiatric: Normal mood and affect  Lab Results: Basic Metabolic Panel: Recent Labs    04/17/19 1731 04/18/19 0508  NA 142 142  K 3.3* 4.1  CL 112* 111  CO2 20*  23  GLUCOSE 145* 121*  BUN 16 18  CREATININE 0.83 0.76  CALCIUM 8.6* 8.9   Liver Function Tests: No results for input(s): AST, ALT, ALKPHOS, BILITOT, PROT, ALBUMIN in the last 72 hours. No results for input(s): LIPASE, AMYLASE in the last 72 hours. No results for input(s): AMMONIA in the last 72 hours. CBC: Recent Labs    04/17/19 1731  WBC 6.0  NEUTROABS 5.5  HGB 13.9  HCT 43.6  MCV 93.2  PLT 229   Cardiac Enzymes: No results for input(s): CKTOTAL, CKMB, CKMBINDEX, TROPONINI in the last 72 hours. BNP: No results for input(s): PROBNP in the last 72 hours. D-Dimer: No results for input(s): DDIMER in the last 72 hours. CBG: No results for input(s): GLUCAP in the last 72 hours. Hemoglobin A1C: No results for input(s): HGBA1C in the last 72 hours. Fasting Lipid Panel: No results for input(s): CHOL, HDL, LDLCALC, TRIG, CHOLHDL, LDLDIRECT in the last 72 hours. Thyroid Function Tests: No results for input(s): TSH, T4TOTAL, FREET4, T3FREE, THYROIDAB in the last 72 hours. Anemia Panel: No results for input(s): VITAMINB12, FOLATE, FERRITIN, TIBC, IRON, RETICCTPCT in the last 72 hours. Coagulation: No results for input(s): LABPROT, INR in the last 72 hours. Urine Drug Screen: Drugs of Abuse     Component Value Date/Time   LABOPIA NONE DETECTED 10/20/2010 0548   COCAINSCRNUR NONE DETECTED 10/20/2010 0548   LABBENZ POSITIVE (A) 10/20/2010 0548   AMPHETMU NONE DETECTED 10/20/2010 0548   THCU NONE DETECTED 10/20/2010 0548   LABBARB NONE DETECTED 10/20/2010 0548    Alcohol Level: No results for input(s): ETH in the last 72 hours. Urinalysis: No results for input(s): COLORURINE, LABSPEC, PHURINE, GLUCOSEU, HGBUR, BILIRUBINUR, KETONESUR, PROTEINUR, UROBILINOGEN, NITRITE, LEUKOCYTESUR in the last 72 hours.  Invalid input(s): APPERANCEUR Misc. Labs:   ABGS: No results for input(s): PHART, PO2ART, TCO2, HCO3 in the last 72 hours.  Invalid input(s):  PCO2   MICROBIOLOGY: Recent Results (from the past 240 hour(s))  Respiratory Panel by RT PCR (Flu A&B, Covid) - Nasopharyngeal Swab     Status: None   Collection Time: 04/17/19 12:46 PM   Specimen: Nasopharyngeal Swab  Result Value Ref Range Status   SARS Coronavirus 2 by RT PCR NEGATIVE NEGATIVE Final    Comment: (NOTE) SARS-CoV-2 target nucleic acids are NOT DETECTED. The SARS-CoV-2 RNA is generally detectable in upper respiratoy specimens during the acute phase of infection. The lowest concentration  of SARS-CoV-2 viral copies this assay can detect is 131 copies/mL. A negative result does not preclude SARS-Cov-2 infection and should not be used as the sole basis for treatment or other patient management decisions. A negative result may occur with  improper specimen collection/handling, submission of specimen other than nasopharyngeal swab, presence of viral mutation(s) within the areas targeted by this assay, and inadequate number of viral copies (<131 copies/mL). A negative result must be combined with clinical observations, patient history, and epidemiological information. The expected result is Negative. Fact Sheet for Patients:  https://www.moore.com/ Fact Sheet for Healthcare Providers:  https://www.young.biz/ This test is not yet ap proved or cleared by the [place] FDA and  has been authorized for detection and/or diagnosis of SARS-CoV-2 by FDA under an Emergency Use Authorization (EUA). This EUA will remain  in effect (meaning this test can be used) for the duration of the COVID-19 declaration under Section 564(b)(1) of the Act, 21 U.S.C. section 360bbb-3(b)(1), unless the authorization is terminated or revoked sooner.    Influenza A by PCR NEGATIVE NEGATIVE Final   Influenza B by PCR NEGATIVE NEGATIVE Final    Comment: (NOTE) The Xpert Xpress SARS-CoV-2/FLU/RSV assay is intended as an aid in  the diagnosis of influenza  from Nasopharyngeal swab specimens and  should not be used as a sole basis for treatment. Nasal washings and  aspirates are unacceptable for Xpert Xpress SARS-CoV-2/FLU/RSV  testing. Fact Sheet for Patients: https://www.moore.com/ Fact Sheet for Healthcare Providers: https://www.young.biz/ This test is not yet approved or cleared by the [place] FDA and  has been authorized for detection and/or diagnosis of SARS-CoV-2 by  FDA under an Emergency Use Authorization (EUA). This EUA will remain  in effect (meaning this test can be used) for the duration of the  Covid-19 declaration under Section 564(b)(1) of the Act, 21  U.S.C. section 360bbb-3(b)(1), unless the authorization is  terminated or revoked. Performed at [facility], [address], [place], [place] [address]     Studies/Results: Dg Chest Port 1 View  Result Date: 04/17/2019 CLINICAL DATA:  Shortness of breath and cough. Asthma. EXAM: PORTABLE CHEST 1 VIEW COMPARISON:  04/05/2019 FINDINGS: Heart size and pulmonary vascularity are normal. Lungs are clear. No effusions. No bone abnormality. IMPRESSION: No active disease. Electronically Signed   By: Francene Boyers M.D.   On: 04/17/2019 12:33    Medications:  Prior to Admission:  Medications Prior to Admission  Medication Sig Dispense Refill Last Dose  . albuterol (PROVENTIL) (2.5 MG/3ML) 0.083% nebulizer solution Take 3 mLs (2.5 mg total) by nebulization every 6 (six) hours as needed for wheezing or shortness of breath. 360 mL 5 04/17/2019 at Unknown time  . albuterol (VENTOLIN HFA) 108 (90 Base) MCG/ACT inhaler TAKE 2 PUFFS BY MOUTH EVERY 4 HOURS AS NEEDED (Patient taking differently: Inhale 2 puffs into the lungs every 6 (six) hours as needed for wheezing or shortness of breath. ) 8.5 g 5 04/17/2019 at Unknown time  . EPINEPHrine 0.3 mg/0.3 mL IJ SOAJ injection Inject into thigh for severe allergic reaction (Patient taking differently:  Inject 0.3 mg into the muscle once. Inject into thigh for severe allergic reaction) 1 Device prn unknown  . montelukast (SINGULAIR) 10 MG tablet TAKE 1 TABLET BY MOUTH EVERYDAY AT BEDTIME (Patient taking differently: Take 10 mg by mouth at bedtime. ) 90 tablet 3 04/16/2019 at Unknown time  . primidone (MYSOLINE) 50 MG tablet Take 1 tablet (50 mg total) by mouth 2 (two) times daily. 180  tablet 1 04/16/2019 at Unknown time  . Reslizumab (CINQAIR IV) Inject 228.3 mg into the vein every 30 (thirty) days.   04/13/2019  . TRELEGY ELLIPTA 100-62.5-25 MCG/INH AEPB INHALE 1 PUFF INTO THE LUNGS DAILY. RINSE MOUTH (Patient taking differently: Inhale 1 puff into the lungs daily. ) 60 each 3 04/16/2019 at Unknown time  . predniSONE (DELTASONE) 50 MG tablet Take 1 tablet (50 mg total) by mouth daily with breakfast. (Patient not taking: Reported on 04/17/2019) 5 tablet 0 Not Taking at Unknown time  . predniSONE (DELTASONE) 50 MG tablet Take 1 tablet (50 mg total) by mouth daily. (Patient not taking: Reported on 04/17/2019) 5 tablet 0 Not Taking at Unknown time   Scheduled: . albuterol  2.5 mg Nebulization Q4H  . budesonide (PULMICORT) nebulizer solution  0.25 mg Nebulization BID  . montelukast  10 mg Oral QHS  . predniSONE  50 mg Oral Daily  . primidone  50 mg Oral BID  . umeclidinium-vilanterol  1 puff Inhalation Daily   Continuous:  ZOX:[id number]:acetaminophen **OR** acetaminophen, albuterol  Assesment: He was admitted with persistent asthma with acute exacerbation.  He is on prednisone Pulmicort and I have reordered Trelegy.  I have scheduled his nebulizer treatment since he still has wheezing.  Once he is discharged he will need to discuss with Dr. [name] about coming off his current biologic and switching to something else since it does seem to be precipitating some problems. Principal Problem:   Persistent asthma with acute exacerbation Active Problems:   Acute respiratory failure with hypoxia (HCC)    Hypoxia    Plan: As above    LOS: 1 day   Fredirick Maudlindward L Cayleb Jarnigan 04/18/2019, 8:35 AM

## 2019-04-18 NOTE — Progress Notes (Addendum)
PROGRESS NOTE [facility]   Drew Sandoval  XTG:[id number]  DOB: 10-25-1975  DOA: 04/17/2019 PCP: Monico Blitz, MD  Brief Admission Hx: 43 y.o. male with severe persistent asthma who presents with shortness of breath and feeling tight on and off for the past 5 weeks.  He believes that he gets short of breath after getting his monthly reslizumab infusion.     MDM/Assessment & Plan:   1. Dyspnea / Uncontrolled severe persistent asthma - He is being restarted on his bronchodilators and prednisone oral tablets.  He has been seen by Dr. Luan Pulling.  He will follow up with Dr. Annamaria Boots his outpatient pulmonologist.   Scheduled bronchodilators ordered.  2. Hypokalemia - repleted.  3. History of SVT - has been stable.   DVT prophylaxis: SCDs Code Status: Full  Family Communication: patient updated hospital  Disposition Plan: home in next 1-2 days if stable    Consultants:  Pulmonology   Procedures:    Antimicrobials:     Subjective: Pt says that he is wheezing and having some mild SOB.   Objective: Vitals:   04/18/19 0903 04/18/19 0916 04/18/19 1111 04/18/19 1500  BP:    128/74  Pulse:    78  Resp:    18  Temp:    98.6 F (37 C)  TempSrc:      SpO2: 95% 95% 95% 96%  Weight:      Height:       No intake or output data in the 24 hours ending 04/18/19 1611 Filed Weights   04/17/19 1149 04/17/19 2102  Weight: 81.6 kg 77.3 kg     REVIEW OF SYSTEMS  As per history otherwise all reviewed and reported negative  Exam:  General exam: thin male, awake, alert, NAD.  Respiratory system: bibasilar wheezing noted. . No increased work of breathing. Cardiovascular system: S1 & S2 heard. No JVD, murmurs, gallops, clicks or pedal edema. Gastrointestinal system: Abdomen is nondistended, soft and nontender. Normal bowel sounds heard. Central nervous system: Alert and oriented. No focal neurological deficits. Extremities: no CCE.  Data Reviewed: Basic Metabolic Panel:  Recent Labs  Lab 04/17/19 1731 04/18/19 0508  NA 142 142  K 3.3* 4.1  CL 112* 111  CO2 20* 23  GLUCOSE 145* 121*  BUN 16 18  CREATININE 0.83 0.76  CALCIUM 8.6* 8.9   Liver Function Tests: No results for input(s): AST, ALT, ALKPHOS, BILITOT, PROT, ALBUMIN in the last 168 hours. No results for input(s): LIPASE, AMYLASE in the last 168 hours. No results for input(s): AMMONIA in the last 168 hours. CBC: Recent Labs  Lab 04/17/19 1731  WBC 6.0  NEUTROABS 5.5  HGB 13.9  HCT 43.6  MCV 93.2  PLT 229   Cardiac Enzymes: No results for input(s): CKTOTAL, CKMB, CKMBINDEX, TROPONINI in the last 168 hours. CBG (last 3)  No results for input(s): GLUCAP in the last 72 hours. Recent Results (from the past 240 hour(s))  Respiratory Panel by RT PCR (Flu A&B, Covid) - Nasopharyngeal Swab     Status: None   Collection Time: 04/17/19 12:46 PM   Specimen: Nasopharyngeal Swab  Result Value Ref Range Status   SARS Coronavirus 2 by RT PCR NEGATIVE NEGATIVE Final    Comment: (NOTE) SARS-CoV-2 target nucleic acids are NOT DETECTED. The SARS-CoV-2 RNA is generally detectable in upper respiratoy specimens during the acute phase of infection. The lowest concentration of SARS-CoV-2 viral copies this assay can detect is 131 copies/mL. A negative result does not preclude  SARS-Cov-2 infection and should not be used as the sole basis for treatment or other patient management decisions. A negative result may occur with  improper specimen collection/handling, submission of specimen other than nasopharyngeal swab, presence of viral mutation(s) within the areas targeted by this assay, and inadequate number of viral copies (<131 copies/mL). A negative result must be combined with clinical observations, patient history, and epidemiological information. The expected result is Negative. Fact Sheet for Patients:  https://www.moore.com/ Fact Sheet for Healthcare Providers:   https://www.young.biz/ This test is not yet ap proved or cleared by the [place] FDA and  has been authorized for detection and/or diagnosis of SARS-CoV-2 by FDA under an Emergency Use Authorization (EUA). This EUA will remain  in effect (meaning this test can be used) for the duration of the COVID-19 declaration under Section 564(b)(1) of the Act, 21 U.S.C. section 360bbb-3(b)(1), unless the authorization is terminated or revoked sooner.    Influenza A by PCR NEGATIVE NEGATIVE Final   Influenza B by PCR NEGATIVE NEGATIVE Final    Comment: (NOTE) The Xpert Xpress SARS-CoV-2/FLU/RSV assay is intended as an aid in  the diagnosis of influenza from Nasopharyngeal swab specimens and  should not be used as a sole basis for treatment. Nasal washings and  aspirates are unacceptable for Xpert Xpress SARS-CoV-2/FLU/RSV  testing. Fact Sheet for Patients: https://www.moore.com/ Fact Sheet for Healthcare Providers: https://www.young.biz/ This test is not yet approved or cleared by the [place] FDA and  has been authorized for detection and/or diagnosis of SARS-CoV-2 by  FDA under an Emergency Use Authorization (EUA). This EUA will remain  in effect (meaning this test can be used) for the duration of the  Covid-19 declaration under Section 564(b)(1) of the Act, 21  U.S.C. section 360bbb-3(b)(1), unless the authorization is  terminated or revoked. Performed at [facility], [address] [place], [place] [address]      Studies: Dg Chest [facility] 1 View  Result Date: 04/17/2019 CLINICAL DATA:  Shortness of breath and cough. Asthma. EXAM: PORTABLE CHEST 1 VIEW COMPARISON:  04/05/2019 FINDINGS: Heart size and pulmonary vascularity are normal. Lungs are clear. No effusions. No bone abnormality. IMPRESSION: No active disease. Electronically Signed   By: Francene Boyers M.D.   On: 04/17/2019 12:33     Scheduled Meds: . albuterol  2.5  mg Nebulization Q4H  . budesonide (PULMICORT) nebulizer solution  0.25 mg Nebulization BID  . montelukast  10 mg Oral QHS  . predniSONE  50 mg Oral Daily  . primidone  50 mg Oral BID  . umeclidinium-vilanterol  1 puff Inhalation Daily   Continuous Infusions:  Principal Problem:   Persistent asthma with acute exacerbation Active Problems:   Acute respiratory failure with hypoxia (HCC)   Hypoxia   Time spent:   Standley Dakins, MD [facility] 04/18/2019, 4:11 PM    LOS: 1 day  How to contact the [facility] Attending or Consulting provider 7A - 7P or covering provider during after hours 7P -7A, for this patient?  1. Check the care team in [facility] and look for a) attending/consulting [facility] provider listed and b) the [facility] team listed 2. Log into [email or web address] and use [place]'s universal password to access. If you do not have the password, please contact the hospital operator. 3. Locate the [facility] provider you are looking for under [facility] and page to a number that you can be directly reached. 4. If you still have difficulty reaching the provider, please page the [facility] (Director on  Call) for the Hospitalists listed on amion for assistance.

## 2019-04-19 MED ORDER — ALBUTEROL SULFATE HFA 108 (90 BASE) MCG/ACT IN AERS: 2.0000 | INHALATION_SPRAY | Freq: Four times a day (QID) | RESPIRATORY_TRACT | Status: DC | PRN

## 2019-04-19 MED ORDER — ALBUTEROL SULFATE (2.5 MG/3ML) 0.083% IN NEBU
2.5000 mg | INHALATION_SOLUTION | Freq: Three times a day (TID) | RESPIRATORY_TRACT | Status: DC
  Filled 2019-04-19: qty 3

## 2019-04-19 MED ORDER — PREDNISONE 20 MG PO TABS: ORAL_TABLET | ORAL | 0 refills | Status: DC

## 2019-04-19 NOTE — Progress Notes (Signed)
Subjective: He says he feels better.  He says he is rating himself about an 8-1/2 out of 10.  He is coughing up some clear sputum.  Objective: Vital signs in last 24 hours: Temp:  [98 F (36.7 C)-98.6 F (37 C)] 98 F (36.7 C) (12/09 2142) Pulse Rate:  [69-78] 69 (12/09 2142) Resp:  [18] 18 (12/09 2142) BP: (128-134)/(74-75) 134/75 (12/09 2142) SpO2:  [94 %-96 %] 95 % (12/10 0832) Weight change:     Intake/Output from previous day: No intake/output data recorded.  PHYSICAL EXAM General appearance: alert, cooperative and no distress Resp: His chest is clear today Cardio: regular rate and rhythm, S1, S2 normal, no murmur, click, rub or gallop GI: soft, non-tender; bowel sounds normal; no masses,  no organomegaly Extremities: extremities normal, atraumatic, no cyanosis or edema  Lab Results:  Results for orders placed or performed during the hospital encounter of 04/17/19 (from the past 48 hour(s))  Respiratory Panel by RT PCR (Flu A&B, Covid) - Nasopharyngeal Swab     Status: None   Collection Time: 04/17/19 12:46 PM   Specimen: Nasopharyngeal Swab  Result Value Ref Range   SARS Coronavirus 2 by RT PCR NEGATIVE NEGATIVE    Comment: (NOTE) SARS-CoV-2 target nucleic acids are NOT DETECTED. The SARS-CoV-2 RNA is generally detectable in upper respiratoy specimens during the acute phase of infection. The lowest concentration of SARS-CoV-2 viral copies this assay can detect is 131 copies/mL. A negative result does not preclude SARS-Cov-2 infection and should not be used as the sole basis for treatment or other patient management decisions. A negative result may occur with  improper specimen collection/handling, submission of specimen other than nasopharyngeal swab, presence of viral mutation(s) within the areas targeted by this assay, and inadequate number of viral copies (<131 copies/mL). A negative result must be combined with clinical observations, patient history, and  epidemiological information. The expected result is Negative. Fact Sheet for Patients:  PinkCheek.be Fact Sheet for Healthcare Providers:  GravelBags.it This test is not yet ap proved or cleared by the [place] FDA and  has been authorized for detection and/or diagnosis of SARS-CoV-2 by FDA under an Emergency Use Authorization (EUA). This EUA will remain  in effect (meaning this test can be used) for the duration of the COVID-19 declaration under Section 564(b)(1) of the Act, 21 U.S.C. section 360bbb-3(b)(1), unless the authorization is terminated or revoked sooner.    Influenza A by PCR NEGATIVE NEGATIVE   Influenza B by PCR NEGATIVE NEGATIVE    Comment: (NOTE) The Xpert Xpress SARS-CoV-2/FLU/RSV assay is intended as an aid in  the diagnosis of influenza from Nasopharyngeal swab specimens and  should not be used as a sole basis for treatment. Nasal washings and  aspirates are unacceptable for Xpert Xpress SARS-CoV-2/FLU/RSV  testing. Fact Sheet for Patients: PinkCheek.be Fact Sheet for Healthcare Providers: GravelBags.it This test is not yet approved or cleared by the [place] FDA and  has been authorized for detection and/or diagnosis of SARS-CoV-2 by  FDA under an Emergency Use Authorization (EUA). This EUA will remain  in effect (meaning this test can be used) for the duration of the  Covid-19 declaration under Section 564(b)(1) of the Act, 21  U.S.C. section 360bbb-3(b)(1), unless the authorization is  terminated or revoked. Performed at [facility], [address], [place], [place] [address]   Basic metabolic panel     Status: Abnormal   Collection Time: 04/17/19  5:31 PM  Result Value Ref Range  Sodium 142 135 - 145 mmol/L   Potassium 3.3 (L) 3.5 - 5.1 mmol/L   Chloride 112 (H) 98 - 111 mmol/L   CO2 20 (L) 22 - 32 mmol/L   Glucose, Bld 145 (H)  70 - 99 mg/dL   BUN 16 6 - 20 mg/dL   Creatinine, Ser 1.61 0.61 - 1.24 mg/dL   Calcium 8.6 (L) 8.9 - 10.3 mg/dL   GFR calc non Af Amer >60 >60 mL/min   GFR calc Af Amer >60 >60 mL/min   Anion gap 10 5 - 15    Comment: Performed at [facility], [address] [place], [place] [address]  CBC with Differential     Status: Abnormal   Collection Time: 04/17/19  5:31 PM  Result Value Ref Range   WBC 6.0 4.0 - 10.5 K/uL   RBC 4.68 4.22 - 5.81 MIL/uL   Hemoglobin 13.9 13.0 - 17.0 g/dL   HCT 54.0 98.1 - 19.1 %   MCV 93.2 80.0 - 100.0 fL   MCH 29.7 26.0 - 34.0 pg   MCHC 31.9 30.0 - 36.0 g/dL   RDW 47.8 29.5 - 62.1 %   Platelets 229 150 - 400 K/uL   nRBC 0.0 0.0 - 0.2 %   Neutrophils Relative % 92 %   Neutro Abs 5.5 1.7 - 7.7 K/uL   Lymphocytes Relative 6 %   Lymphs Abs 0.4 (L) 0.7 - 4.0 K/uL   Monocytes Relative 1 %   Monocytes Absolute 0.1 0.1 - 1.0 K/uL   Eosinophils Relative 0 %   Eosinophils Absolute 0.0 0.0 - 0.5 K/uL   Basophils Relative 0 %   Basophils Absolute 0.0 0.0 - 0.1 K/uL   Immature Granulocytes 1 %   Abs Immature Granulocytes 0.03 0.00 - 0.07 K/uL    Comment: Performed at [facility], [address] [place], [place] [address]  HIV Antibody (routine testing w rflx)     Status: None   Collection Time: 04/17/19  5:31 PM  Result Value Ref Range   HIV Screen 4th Generation wRfx NON REACTIVE NON REACTIVE    Comment: Performed at [facility], [address] [place], [place] [address]  Basic metabolic panel     Status: Abnormal   Collection Time: 04/18/19  5:08 AM  Result Value Ref Range   Sodium 142 135 - 145 mmol/L   Potassium 4.1 3.5 - 5.1 mmol/L    Comment: DELTA CHECK NOTED   Chloride 111 98 - 111 mmol/L   CO2 23 22 - 32 mmol/L   Glucose, Bld 121 (H) 70 - 99 mg/dL   BUN 18 6 - 20 mg/dL   Creatinine, Ser 6.29 0.61 - 1.24 mg/dL   Calcium 8.9 8.9 - 52.8 mg/dL   GFR calc non Af Amer >60 >60 mL/min   GFR calc Af Amer >60 >60 mL/min   Anion gap 8 5 - 15     Comment: Performed at [facility], [address], [place], [place] [address]    ABGS No results for input(s): PHART, PO2ART, TCO2, HCO3 in the last 72 hours.  Invalid input(s): PCO2 CULTURES Recent Results (from the past 240 hour(s))  Respiratory Panel by RT PCR (Flu A&B, Covid) - Nasopharyngeal Swab     Status: None   Collection Time: 04/17/19 12:46 PM   Specimen: Nasopharyngeal Swab  Result Value Ref Range Status   SARS Coronavirus 2 by RT PCR NEGATIVE NEGATIVE Final    Comment: (NOTE) SARS-CoV-2 target  nucleic acids are NOT DETECTED. The SARS-CoV-2 RNA is generally detectable in upper respiratoy specimens during the acute phase of infection. The lowest concentration of SARS-CoV-2 viral copies this assay can detect is 131 copies/mL. A negative result does not preclude SARS-Cov-2 infection and should not be used as the sole basis for treatment or other patient management decisions. A negative result may occur with  improper specimen collection/handling, submission of specimen other than nasopharyngeal swab, presence of viral mutation(s) within the areas targeted by this assay, and inadequate number of viral copies (<131 copies/mL). A negative result must be combined with clinical observations, patient history, and epidemiological information. The expected result is Negative. Fact Sheet for Patients:  https://www.moore.com/https://www.fda.gov/media/142436/download Fact Sheet for Healthcare Providers:  https://www.young.biz/https://www.fda.gov/media/142435/download This test is not yet ap proved or cleared by the [place] FDA and  has been authorized for detection and/or diagnosis of SARS-CoV-2 by FDA under an Emergency Use Authorization (EUA). This EUA will remain  in effect (meaning this test can be used) for the duration of the COVID-19 declaration under Section 564(b)(1) of the Act, 21 U.S.C. section 360bbb-3(b)(1), unless the authorization is terminated or revoked sooner.    Influenza A by PCR NEGATIVE  NEGATIVE Final   Influenza B by PCR NEGATIVE NEGATIVE Final    Comment: (NOTE) The Xpert Xpress SARS-CoV-2/FLU/RSV assay is intended as an aid in  the diagnosis of influenza from Nasopharyngeal swab specimens and  should not be used as a sole basis for treatment. Nasal washings and  aspirates are unacceptable for Xpert Xpress SARS-CoV-2/FLU/RSV  testing. Fact Sheet for Patients: https://www.moore.com/https://www.fda.gov/media/142436/download Fact Sheet for Healthcare Providers: https://www.young.biz/https://www.fda.gov/media/142435/download This test is not yet approved or cleared by the [place] FDA and  has been authorized for detection and/or diagnosis of SARS-CoV-2 by  FDA under an Emergency Use Authorization (EUA). This EUA will remain  in effect (meaning this test can be used) for the duration of the  Covid-19 declaration under Section 564(b)(1) of the Act, 21  U.S.C. section 360bbb-3(b)(1), unless the authorization is  terminated or revoked. Performed at [facility], [address] [place], [place] [id number]    Studies/[facility]  Result Date: 04/17/2019 CLINICAL DATA:  Shortness of breath and cough. Asthma. EXAM: PORTABLE CHEST 1 VIEW COMPARISON:  04/05/2019 FINDINGS: Heart size and pulmonary vascularity are normal. Lungs are clear. No effusions. No bone abnormality. IMPRESSION: No active disease. Electronically Signed   By: Francene BoyersJames  Maxwell M.D.   On: 04/17/2019 12:33    Medications:  Prior to Admission:  Medications Prior to Admission  Medication Sig Dispense Refill Last Dose  . albuterol (PROVENTIL) (2.5 MG/3ML) 0.083% nebulizer solution Take 3 mLs (2.5 mg total) by nebulization every 6 (six) hours as needed for wheezing or shortness of breath. 360 mL 5 04/17/2019 at Unknown time  . albuterol (VENTOLIN HFA) 108 (90 Base) MCG/ACT inhaler TAKE 2 PUFFS BY MOUTH EVERY 4 HOURS AS NEEDED (Patient taking differently: Inhale 2 puffs into the lungs every 6 (six) hours as needed for wheezing or shortness  of breath. ) 8.5 g 5 04/17/2019 at Unknown time  . EPINEPHrine 0.3 mg/0.3 mL IJ SOAJ injection Inject into thigh for severe allergic reaction (Patient taking differently: Inject 0.3 mg into the muscle once. Inject into thigh for severe allergic reaction) 1 Device prn unknown  . montelukast (SINGULAIR) 10 MG tablet TAKE 1 TABLET BY MOUTH EVERYDAY AT BEDTIME (Patient taking differently: Take 10 mg by mouth at bedtime. ) 90 tablet 3 04/16/2019  at Unknown time  . primidone (MYSOLINE) 50 MG tablet Take 1 tablet (50 mg total) by mouth 2 (two) times daily. 180 tablet 1 04/16/2019 at Unknown time  . Reslizumab (CINQAIR IV) Inject 228.3 mg into the vein every 30 (thirty) days.   04/13/2019  . TRELEGY ELLIPTA 100-62.5-25 MCG/INH AEPB INHALE 1 PUFF INTO THE LUNGS DAILY. RINSE MOUTH (Patient taking differently: Inhale 1 puff into the lungs daily. ) 60 each 3 04/16/2019 at Unknown time  . predniSONE (DELTASONE) 50 MG tablet Take 1 tablet (50 mg total) by mouth daily with breakfast. (Patient not taking: Reported on 04/17/2019) 5 tablet 0 Not Taking at Unknown time  . predniSONE (DELTASONE) 50 MG tablet Take 1 tablet (50 mg total) by mouth daily. (Patient not taking: Reported on 04/17/2019) 5 tablet 0 Not Taking at Unknown time   Scheduled: . albuterol  2.5 mg Nebulization QID  . budesonide (PULMICORT) nebulizer solution  0.25 mg Nebulization BID  . montelukast  10 mg Oral QHS  . predniSONE  50 mg Oral Daily  . primidone  50 mg Oral BID  . umeclidinium-vilanterol  1 puff Inhalation Daily   Continuous:  BJY:[id number] **OR** acetaminophen, albuterol  Assesment: He was admitted with persistent asthma with acute exacerbation and acute hypoxic respiratory failure.  This is at least temporarily related to receiving an infusion for asthma.  He says he has had issues for the last several infusions.  He is much improved. Principal Problem:   Persistent asthma with acute exacerbation Active Problems:   Acute  respiratory failure with hypoxia (HCC)   Hypoxia    Plan: Okay to go home today or tomorrow from a pulmonary point of view    LOS: 2 days   Fredirick Maudlin 04/19/2019, 8:39 AM

## 2019-04-19 NOTE — Discharge Instructions (Signed)
Asthma, Adult ° °Asthma is a long-term (chronic) condition in which the airways get tight and narrow. The airways are the breathing passages that lead from the nose and mouth down into the lungs. A person with asthma will have times when symptoms get worse. These are called asthma attacks. They can cause coughing, whistling sounds when you breathe (wheezing), shortness of breath, and chest pain. They can make it hard to breathe. There is no cure for asthma, but medicines and lifestyle changes can help control it. °There are many things that can bring on an asthma attack or make asthma symptoms worse (triggers). Common triggers include: °· Mold. °· Dust. °· Cigarette smoke. °· Cockroaches. °· Things that can cause allergy symptoms (allergens). These include animal skin flakes (dander) and pollen from trees or grass. °· Things that pollute the air. These may include household cleaners, wood smoke, smog, or chemical odors. °· Cold air, weather changes, and wind. °· Crying or laughing hard. °· Stress. °· Certain medicines or drugs. °· Certain foods such as dried fruit, potato chips, and grape juice. °· Infections, such as a cold or the flu. °· Certain medical conditions or diseases. °· Exercise or tiring activities. °Asthma may be treated with medicines and by staying away from the things that cause asthma attacks. Types of medicines may include: °· Controller medicines. These help prevent asthma symptoms. They are usually taken every day. °· Fast-acting reliever or rescue medicines. These quickly relieve asthma symptoms. They are used as needed and provide short-term relief. °· Allergy medicines if your attacks are brought on by allergens. °· Medicines to help control the body's defense (immune) system. °Follow these instructions at home: °Avoiding triggers in your home °· Change your heating and air conditioning filter often. °· Limit your use of fireplaces and wood stoves. °· Get rid of pests (such as roaches and  mice) and their droppings. °· Throw away plants if you see mold on them. °· Clean your floors. Dust regularly. Use cleaning products that do not smell. °· Have someone vacuum when you are not home. Use a vacuum cleaner with a HEPA filter if possible. °· Replace carpet with wood, tile, or vinyl flooring. Carpet can trap animal skin flakes and dust. °· Use allergy-proof pillows, mattress covers, and box spring covers. °· Wash bed sheets and blankets every week in hot water. Dry them in a dryer. °· Keep your bedroom free of any triggers. °· Avoid pets and keep windows closed when things that cause allergy symptoms are in the air. °· Use blankets that are made of polyester or cotton. °· Clean bathrooms and kitchens with bleach. If possible, have someone repaint the walls in these rooms with mold-resistant paint. Keep out of the rooms that are being cleaned and painted. °· Wash your hands often with soap and water. If soap and water are not available, use hand sanitizer. °· Do not allow anyone to smoke in your home. °General instructions °· Take over-the-counter and prescription medicines only as told by your doctor. °? Talk with your doctor if you have questions about how or when to take your medicines. °? Make note if you need to use your medicines more often than usual. °· Do not use any products that contain nicotine or tobacco, such as cigarettes and e-cigarettes. If you need help quitting, ask your doctor. °· Stay away from secondhand smoke. °· Avoid doing things outdoors when allergen counts are high and when air quality is low. °· Wear a ski mask   when doing outdoor activities in the winter. The mask should cover your nose and mouth. Exercise indoors on cold days if you can.  Warm up before you exercise. Take time to cool down after exercise.  Use a peak flow meter as told by your doctor. A peak flow meter is a tool that measures how well the lungs are working.  Keep track of the peak flow meter's readings.  Write them down.  Follow your asthma action plan. This is a written plan for taking care of your asthma and treating your attacks.  Make sure you get all the shots (vaccines) that your doctor recommends. Ask your doctor about a flu shot and a pneumonia shot.  Keep all follow-up visits as told by your doctor. This is important. Contact a doctor if:  You have wheezing, shortness of breath, or a cough even while taking medicine to prevent attacks.  The mucus you cough up (sputum) is thicker than usual.  The mucus you cough up changes from clear or white to yellow, green, gray, or bloody.  You have problems from the medicine you are taking, such as: ? A rash. ? Itching. ? Swelling. ? Trouble breathing.  You need reliever medicines more than 2-3 times a week.  Your peak flow reading is still at 50-79% of your personal best after following the action plan for 1 hour.  You have a fever. Get help right away if:  You seem to be worse and are not responding to medicine during an asthma attack.  You are short of breath even at rest.  You get short of breath when doing very little activity.  You have trouble eating, drinking, or talking.  You have chest pain or tightness.  You have a fast heartbeat.  Your lips or fingernails start to turn blue.  You are light-headed or dizzy, or you faint.  Your peak flow is less than 50% of your personal best.  You feel too tired to breathe normally. Summary  Asthma is a long-term (chronic) condition in which the airways get tight and narrow. An asthma attack can make it hard to breathe.  Asthma cannot be cured, but medicines and lifestyle changes can help control it.  Make sure you understand how to avoid triggers and how and when to use your medicines. This information is not intended to replace advice given to you by your health care provider. Make sure you discuss any questions you have with your health care provider. Document  Released: 10/13/2007 Document Revised: 06/29/2018 Document Reviewed: 05/31/2016 Elsevier Patient Education  2020 [employer].   Asthma and Physical Activity Physical activity is an important part of a healthy lifestyle. If you have asthma, it is important to exercise because physical activity can help you to:  Control your asthma.  Maintain your weight or lose weight.  Increase your energy.  Decrease stress and anxiety.  Lower your risk of getting sick.  Improve your heart health. However, asthma symptoms can flare up when you are physically active or exercising. You can learn how to control your asthma and prevent symptoms during exercise. This will help you to remain physically active. How can asthma affect my ability to be physically active? When you have asthma, physical activity can cause you to have symptoms such as:  Wheezing. This may sound like whistling while breathing.  A feeling of tightness in the chest.  Sore throat.  Coughing.  Shortness of breath.  Tiredness (fatigue) with minimal activity.  Increased sputum  production.  Chest pain. What actions can I take to prevent asthma problems during physical activity? Pulmonary rehabilitation Enroll in a pulmonary rehabilitation program. Benefits of this type of program include:  Education on lung diseases.  Classes that teach you how to exercise and be more active while decreasing your shortness of breath.  A group setting that allows you to talk with others who have asthma. Asthma action plan Follow the asthma action plan set by your health care provider. Your personal asthma plan may include:  Taking your medicines as told by your health care provider.  Avoiding your asthma triggers, except physical activity. Triggers may include cold air, dust, pollen, pet dander, and air pollution.  Tracking your asthma control.  Using a peak flow meter.  Being aware of worsening symptoms.  Knowing when to seek  emergency care. Proper breathing During exercise, follow these tips for proper breathing:  Breathe in before starting the exercise and breathe out during the hardest part of the exercise.  Take slow breaths.  Pace yourself and do not try to go too fast.  While breathing out, purse your lips. Before beginning any exercise program or new activity, talk with your health care provider. Medicines If physical activity triggers your asthma, your health care provider may order the following medicines:  A rescue inhaler (short-acting beta2-agonist) for you to use shortly before physical activity or exercise. Its effects may reduce exercise-related symptoms for 2-3 hours.  A long-acting beta2-agonist that can offer up to 12 hours of relief if taken daily.  Leukotriene modifiers. These pills are taken several hours before physical activity or exercise to help prevent asthma symptoms that are caused by exercise.  Long-term control medicines. These will be given if you have severe or frequent asthma symptoms during or after exercise. These symptoms may also mean that your asthma is not well controlled.  General information  Exercise indoors when the air is dry or during allergy season.  Try to breathe in warm, moist air by wearing a scarf over your nose and mouth or breathing only through your nose.  Spend a few minutes warming up before your workout.  Cool down after exercise. What should I do if my asthma symptoms get worse? Contact your health care provider if your asthma symptoms are getting worse. Your asthma is getting worse if:  You have symptoms more often.  Your symptoms are more severe.  Your symptoms get worse at night and make you lose sleep.  Your peak flow number is lower than your personal best or changes a lot from day to day.  Your asthma medicines do not work as well as they used to.  You use your rescue inhaler more often. If you use your rescue inhaler more than 2  days a week, your asthma is not well controlled.  You go to the emergency room or see your health care provider because of an asthma attack. Where to find support  Ask your health care provider about signing up for a pulmonary rehabilitation program.  Ask your health care provider about asthma support groups.  Visit your local community health department.  Check out local hospitals' community health programs. Where can I get more information?  Your health care provider.  American Lung Association: lung.org  National Heart, Lung, and Blood Institute: [employer] Contact a health care provider if:  You have trouble walking and talking because you are out of breath. Get help right away if:  Your lips or fingernails are blue.  You are not able to breathe or catch your breath. Summary  Physical activity is an important part of a healthy lifestyle. However, if you have asthma, your symptoms can flare up during exercise or physical activity.  You can prevent problems during physical activity by doing pulmonary rehabilitation, following an asthma action plan, doing proper breathing, and using medicines.  Talk with your health care provider before starting any exercise program or new activity. This information is not intended to replace advice given to you by your health care provider. Make sure you discuss any questions you have with your health care provider. Document Released: 07/12/2017 Document Revised: 08/15/2018 Document Reviewed: 07/12/2017 Elsevier Patient Education  2020 [employer]   Asthma Attack Prevention, Adult Although you may not be able to control the fact that you have asthma, you can take actions to prevent episodes of asthma (asthma attacks). These actions include:  Creating a written plan for managing and treating your asthma attacks (asthma action plan).  Monitoring your asthma.  Avoiding things that can irritate your airways or make your asthma  symptoms worse (asthma triggers).  Taking your medicines as directed.  Acting quickly if you have signs or symptoms of an asthma attack. What are some ways to prevent an asthma attack? Create a plan Work with your health care provider to create an asthma action plan. This plan should include:  A list of your asthma triggers and how to avoid them.  A list of symptoms that you experience during an asthma attack.  Information about when to take medicine and how much medicine to take.  Information to help you understand your peak flow measurements.  Contact information for your health care providers.  Daily actions that you can take to control asthma. Monitor your asthma To monitor your asthma:  Use your peak flow meter every morning and every evening for 2-3 weeks. Record the results in a journal. A drop in your peak flow numbers on one or more days may mean that you are starting to have an asthma attack, even if you are not having symptoms.  When you have asthma symptoms, write them down in a journal.  Avoid asthma triggers Work with your health care provider to find out what your asthma triggers are. This can be done by:  Being tested for allergies.  Keeping a journal that notes when asthma attacks occur and what may have contributed to them.  Asking your health care provider whether other medical conditions make your asthma worse. Common asthma triggers include:  Dust.  Smoke. This includes campfire smoke and secondhand smoke from tobacco products.  Pet dander.  Trees, grasses or pollens.  Very cold, dry, or humid air.  Mold.  Foods that contain high amounts of sulfites.  Strong smells.  Engine exhaust and air pollution.  Aerosol sprays and fumes from household cleaners.  Household pests and their droppings, including dust mites and cockroaches.  Certain medicines, including NSAIDs. Once you have determined your asthma triggers, take steps to avoid them.  Depending on your triggers, you may be able to reduce the chance of an asthma attack by:  Keeping your home clean. Have someone dust and vacuum your home for you 1 or 2 times a week. If possible, have them use a high-efficiency particulate arrestance (HEPA) vacuum.  Washing your sheets weekly in hot water.  Using allergy-proof mattress covers and casings on your bed.  Keeping pets out of your home.  Taking care of mold and water problems  in your home.  Avoiding areas where people smoke.  Avoiding using strong perfumes or odor sprays.  Avoid spending a lot of time outdoors when pollen counts are high and on very windy days.  Talking with your health care provider before stopping or starting any new medicines. Medicines Take over-the-counter and prescription medicines only as told by your health care provider. Many asthma attacks can be prevented by carefully following your medicine schedule. Taking your medicines correctly is especially important when you cannot avoid certain asthma triggers. Even if you are doing well, do not stop taking your medicine and do not take less medicine. Act quickly If an asthma attack happens, acting quickly can decrease how severe it is and how long it lasts. Take these actions:  Pay attention to your symptoms. If you are coughing, wheezing, or having difficulty breathing, do not wait to see if your symptoms go away on their own. Follow your asthma action plan.  If you have followed your asthma action plan and your symptoms are not improving, call your health care provider or seek immediate medical care at the nearest hospital. It is important to write down how often you need to use your fast-acting rescue inhaler. You can track how often you use an inhaler in your journal. If you are using your rescue inhaler more often, it may mean that your asthma is not under control. Adjusting your asthma treatment plan may help you to prevent future asthma attacks and  help you to gain better control of your condition. How can I prevent an asthma attack when I exercise? Exercise is a common asthma trigger. To prevent asthma attacks during exercise:  Follow advice from your health care provider about whether you should use your fast-acting inhaler before exercising. Many people with asthma experience exercise-induced bronchoconstriction (EIB). This condition often worsens during vigorous exercise in cold, humid, or dry environments. Usually, people with EIB can stay very active by using a fast-acting inhaler before exercising.  Avoid exercising outdoors in very cold or humid weather.  Avoid exercising outdoors when pollen counts are high.  Warm up and cool down when exercising.  Stop exercising right away if asthma symptoms start. Consider taking part in exercises that are less likely to cause asthma symptoms such as:  Indoor swimming.  Biking.  Walking.  Hiking.  Playing football. This information is not intended to replace advice given to you by your health care provider. Make sure you discuss any questions you have with your health care provider. Document Released: 04/14/2009 Document Revised: 04/08/2017 Document Reviewed: 10/11/2015 Elsevier Patient Education  2020 [employer]   IMPORTANT INFORMATION: PAY CLOSE ATTENTION   PHYSICIAN DISCHARGE INSTRUCTIONS  Follow with Primary care provider  Kirstie PeriShah, Ashish, MD  and other consultants as instructed by your Hospitalist Physician  SEEK MEDICAL CARE OR RETURN TO EMERGENCY ROOM IF SYMPTOMS COME BACK, WORSEN OR NEW PROBLEM DEVELOPS   Please note: You were cared for by a hospitalist during your hospital stay. Every effort will be made to forward records to your primary care provider.  You can request that your primary care provider send for your hospital records if they have not received them.  Once you are discharged, your primary care physician will handle any further medical issues. Please note  that NO REFILLS for any discharge medications will be authorized once you are discharged, as it is imperative that you return to your primary care physician (or establish a relationship with a primary care physician if you  do not have one) for your post hospital discharge needs so that they can reassess your need for medications and monitor your lab values.  Please get a complete blood count and chemistry panel checked by your Primary MD at your next visit, and again as instructed by your Primary MD.  Get Medicines reviewed and adjusted: Please take all your medications with you for your next visit with your Primary MD  Laboratory/radiological data: Please request your Primary MD to go over all hospital tests and procedure/radiological results at the follow up, please ask your primary care provider to get all Hospital records sent to his/her office.  In some cases, they will be blood work, cultures and biopsy results pending at the time of your discharge. Please request that your primary care provider follow up on these results.  If you are diabetic, please bring your blood sugar readings with you to your follow up appointment with primary care.    Please call and make your follow up appointments as soon as possible.    Also Note the following: If you experience worsening of your admission symptoms, develop shortness of breath, life threatening emergency, suicidal or homicidal thoughts you must seek medical attention immediately by calling 911 or calling your MD immediately  if symptoms less severe.  You must read complete instructions/literature along with all the possible adverse reactions/side effects for all the Medicines you take and that have been prescribed to you. Take any new Medicines after you have completely understood and accpet all the possible adverse reactions/side effects.   Do not drive when taking Pain medications or sleeping medications (Benzodiazepines)  Do not take more  than prescribed Pain, Sleep and Anxiety Medications. It is not advisable to combine anxiety,sleep and pain medications without talking with your primary care practitioner  Special Instructions: If you have smoked or chewed Tobacco  in the last 2 yrs please stop smoking, stop any regular Alcohol  and or any Recreational drug use.  Wear Seat belts while driving.  Do not drive if taking any narcotic, mind altering or controlled substances or recreational drugs or alcohol.

## 2019-04-19 NOTE — Discharge Summary (Signed)
Physician Discharge Summary  Drew Sandoval ZOX:[id number] DOB: [birth year] DOA: 04/17/2019  PCP: Kirstie Peri, MD Pulmonologist: C. Young  Admit date: 04/17/2019 Discharge date: 04/19/2019  Admitted From:  Home  Disposition: Home   Recommendations for Outpatient Follow-up:  1. Follow up with PCP in 2 weeks 2. Follow up with pulmonologist in 1 week  Home Health: Home   Discharge Condition: STABLE   CODE STATUS: FULL    Brief Hospitalization Summary: Please see all hospital notes, images, labs for full details of the hospitalization. HPI Dr. Luberta Robertson:  Drew Sandoval is an 43 y.o. male with severe persistent asthma who presents with shortness of breath and feeling tight on and off for the past 5 weeks.  Patient states that since beginning of November he has been treated with 2 courses of steroids with intermittent improvement however shortly after finishing his taper he becomes short of breath again.  Patient states that he completed his last steroid taper approximately a week ago and was doing okay however developed shortness of breath again yesterday.  He believes that he gets short of breath after getting his monthly reslizumab infusion which he did get yesterday.  He states he tried to call his pulmonologist Dr. Maple Hudson to discuss this.  There is a note in the chart from Dr. Maple Hudson yesterday which states that primidone is not associated with increasing asthma flares but does not mention reslizumab.  Patient denies fevers or chills.  Does have cough which is typical for his asthma.  Cough is nonproductive.  No malaise.  ED Course:  The patient was treated with albuterol for 1 hour but patient states that he still feels tight.  Patient does not like to take Solu-Medrol because he feels it causes itching.  Brief Admission Hx: 43 y.o.malewith severe persistent asthma who presents with shortness of breath and feeling tight on and off for the past 5 weeks.  Hebelieves that he gets short  of breath after getting his monthly reslizumabinfusion.     MDM/Assessment & Plan:   1. Dyspnea / Uncontrolled severe persistent asthma - This is resolved now and he is feeling much better after steroids, scheduled bronchodilators and supportive care.  He wants to go home, will have him complete a prednisone taper and have him close follow up with his pulmonologist Dr. Melba Coon.   He has been seen by Dr. Juanetta Gosling the inpatient pulmonologist.  He will follow up with Dr. Maple Hudson his outpatient pulmonologist.    2. Hypokalemia - repleted.  3. History of SVT - has been stable.   DVT prophylaxis: SCDs Code Status: Full  Family Communication: patient updated hospital  Disposition Plan: home   Discharge Diagnoses:  Principal Problem:   Persistent asthma with acute exacerbation Active Problems:   Acute respiratory failure with hypoxia (HCC)   Hypoxia   Discharge Instructions:  Allergies as of 04/19/2019      Reactions   Azithromycin Rash   Alvesco [ciclesonide]    Possible reaction- rash   Anoro Ellipta [umeclidinium-vilanterol]    Possible- rash   Banana Swelling   Throat swelling   Methylprednisolone    Penicillins Rash   Has patient had a PCN reaction causing immediate rash, facial/tongue/throat swelling, SOB or lightheadedness with hypotension: Yes Has patient had a PCN reaction causing severe rash involving mucus membranes or skin necrosis: No Has patient had a PCN reaction that required hospitalization Yes Has patient had a PCN reaction occurring within the last 10 years: No If all of the above  answers are "NO", then may proceed with Cephalosporin use.      Medication List    STOP taking these medications   CINQAIR IV     TAKE these medications   albuterol (2.5 MG/3ML) 0.083% nebulizer solution Commonly known as: PROVENTIL Take 3 mLs (2.5 mg total) by nebulization every 6 (six) hours as needed for wheezing or shortness of breath.   albuterol 108 (90 Base) MCG/ACT  inhaler Commonly known as: VENTOLIN HFA Inhale 2 puffs into the lungs every 6 (six) hours as needed for wheezing or shortness of breath.   EPINEPHrine 0.3 mg/0.3 mL Soaj injection Commonly known as: EPI-PEN Inject into thigh for severe allergic reaction What changed:   how much to take  how to take this  when to take this   montelukast 10 MG tablet Commonly known as: SINGULAIR TAKE 1 TABLET BY MOUTH EVERYDAY AT BEDTIME What changed:   how much to take  how to take this  when to take this  additional instructions   predniSONE 20 MG tablet Commonly known as: DELTASONE Take 3 PO QAM x5days, 2 PO QAM x5days, 1 PO QAM x5days Start taking on: April 20, 2019 What changed:   medication strength  how much to take  how to take this  when to take this  additional instructions  Another medication with the same name was removed. Continue taking this medication, and follow the directions you see here.   primidone 50 MG tablet Commonly known as: MYSOLINE Take 1 tablet (50 mg total) by mouth 2 (two) times daily.   Trelegy Ellipta 100-62.5-25 MCG/INH Aepb Generic drug: Fluticasone-Umeclidin-Vilant INHALE 1 PUFF INTO THE LUNGS DAILY. RINSE MOUTH What changed: See the new instructions.      Follow-up Information    Kirstie Peri, MD. Schedule an appointment as soon as possible for a visit in 2 week(s).   Specialty: Internal Medicine Contact information: [address] [place] [address] [phone]        [name], MD. Schedule an appointment as soon as possible for a visit in 1 week(s).   Specialty: Pulmonary Disease Why: Hospital Follow Up  Contact information: [address] [place] [address] [phone]          Allergies  Allergen Reactions  . Azithromycin Rash  . Alvesco [Ciclesonide]     Possible reaction- rash  . Anoro Ellipta [Umeclidinium-Vilanterol]     Possible- rash  . Banana Swelling    Throat swelling   .  Methylprednisolone   . Penicillins Rash    Has patient had a PCN reaction causing immediate rash, facial/tongue/throat swelling, SOB or lightheadedness with hypotension: Yes Has patient had a PCN reaction causing severe rash involving mucus membranes or skin necrosis: No Has patient had a PCN reaction that required hospitalization Yes Has patient had a PCN reaction occurring within the last 10 years: No If all of the above answers are "NO", then may proceed with Cephalosporin use.     Allergies as of 04/19/2019      Reactions   Azithromycin Rash   Alvesco [ciclesonide]    Possible reaction- rash   Anoro Ellipta [umeclidinium-vilanterol]    Possible- rash   Banana Swelling   Throat swelling   Methylprednisolone    Penicillins Rash   Has patient had a PCN reaction causing immediate rash, facial/tongue/throat swelling, SOB or lightheadedness with hypotension: Yes Has patient had a PCN reaction causing severe rash involving mucus membranes or skin necrosis: No  Has patient had a PCN reaction that required hospitalization Yes Has patient had a PCN reaction occurring within the last 10 years: No If all of the above answers are "NO", then may proceed with Cephalosporin use.      Medication List    STOP taking these medications   CINQAIR IV     TAKE these medications   albuterol (2.5 MG/3ML) 0.083% nebulizer solution Commonly known as: PROVENTIL Take 3 mLs (2.5 mg total) by nebulization every 6 (six) hours as needed for wheezing or shortness of breath.   albuterol 108 (90 Base) MCG/ACT inhaler Commonly known as: VENTOLIN HFA Inhale 2 puffs into the lungs every 6 (six) hours as needed for wheezing or shortness of breath.   EPINEPHrine 0.3 mg/0.3 mL Soaj injection Commonly known as: EPI-PEN Inject into thigh for severe allergic reaction What changed:   how much to take  how to take this  when to take this   montelukast 10 MG tablet Commonly known as: SINGULAIR TAKE 1  TABLET BY MOUTH EVERYDAY AT BEDTIME What changed:   how much to take  how to take this  when to take this  additional instructions   predniSONE 20 MG tablet Commonly known as: DELTASONE Take 3 PO QAM x5days, 2 PO QAM x5days, 1 PO QAM x5days Start taking on: April 20, 2019 What changed:   medication strength  how much to take  how to take this  when to take this  additional instructions  Another medication with the same name was removed. Continue taking this medication, and follow the directions you see here.   primidone 50 MG tablet Commonly known as: MYSOLINE Take 1 tablet (50 mg total) by mouth 2 (two) times daily.   Trelegy Ellipta 100-62.5-25 MCG/INH Aepb Generic drug: Fluticasone-Umeclidin-Vilant INHALE 1 PUFF INTO THE LUNGS DAILY. RINSE MOUTH What changed: See the new instructions.       Procedures/Studies: DG Chest 2 View  Result Date: 04/05/2019 CLINICAL DATA:  Shortness of breath, wheezing. Additional history provided: Asthma. EXAM: CHEST - 2 VIEW COMPARISON:  Chest radiograph 03/24/2019 FINDINGS: Heart size within normal limits. There is no airspace consolidation within the lungs. No evidence of pleural effusion or pneumothorax. No acute bony abnormality. IMPRESSION: No airspace consolidation. Electronically Signed   By: Jackey Loge DO   On: 04/05/2019 21:55   DG Chest 2 View  Result Date: 03/24/2019 CLINICAL DATA:  Shortness of breath for 1 month EXAM: CHEST - 2 VIEW COMPARISON:  01/11/2018 FINDINGS: Cardiac shadows within normal limits. Mild aortic calcifications are seen. The lungs are hyperinflated consistent with COPD. Mild increased bronchial markings are noted bilaterally consistent with the given clinical history of asthma. IMPRESSION: COPD without acute abnormality. Mild bronchitic markings are noted consistent with the given clinical history of underlying asthma. Electronically Signed   By: Alcide Clever M.D.   On: 03/24/2019 20:39   DG Chest  Port 1 View  Result Date: 04/17/2019 CLINICAL DATA:  Shortness of breath and cough. Asthma. EXAM: PORTABLE CHEST 1 VIEW COMPARISON:  04/05/2019 FINDINGS: Heart size and pulmonary vascularity are normal. Lungs are clear. No effusions. No bone abnormality. IMPRESSION: No active disease. Electronically Signed   By: Francene Boyers M.D.   On: 04/17/2019 12:33      Subjective: Pt reports that he is breathing a lot better and he feels well enough to go home.  He has been ambulating in room and he has not been getting short of breath like he had before,  he denies chest pain.  He says that he will follow up with his pulmonologist Dr. Annamaria Boots.    Discharge Exam: Vitals:   04/18/19 2142 04/19/19 0832  BP: 134/75   Pulse: 69   Resp: 18   Temp: 98 F (36.7 C)   SpO2: 94% 95%   Vitals:   04/18/19 1500 04/18/19 1616 04/18/19 2142 04/19/19 0832  BP: 128/74  134/75   Pulse: 78  69   Resp: 18  18   Temp: 98.6 F (37 C)  98 F (36.7 C)   TempSrc:   Oral   SpO2: 96% 96% 94% 95%  Weight:      Height:       General: Pt is alert, awake, not in acute distress Cardiovascular: RRR, S1/S2 +, no rubs, no gallops Respiratory: good air movement and clear, no wheezing, no rhonchi Abdominal: Soft, NT, ND, bowel sounds + Extremities: no edema, no cyanosis   The results of significant diagnostics from this hospitalization (including imaging, microbiology, ancillary and laboratory) are listed below for reference.     Microbiology: Recent Results (from the past 240 hour(s))  Respiratory Panel by RT PCR (Flu A&B, Covid) - Nasopharyngeal Swab     Status: None   Collection Time: 04/17/19 12:46 PM   Specimen: Nasopharyngeal Swab  Result Value Ref Range Status   SARS Coronavirus 2 by RT PCR NEGATIVE NEGATIVE Final    Comment: (NOTE) SARS-CoV-2 target nucleic acids are NOT DETECTED. The SARS-CoV-2 RNA is generally detectable in upper respiratoy specimens during the acute phase of infection. The  lowest concentration of SARS-CoV-2 viral copies this assay can detect is 131 copies/mL. A negative result does not preclude SARS-Cov-2 infection and should not be used as the sole basis for treatment or other patient management decisions. A negative result may occur with  improper specimen collection/handling, submission of specimen other than nasopharyngeal swab, presence of viral mutation(s) within the areas targeted by this assay, and inadequate number of viral copies (<131 copies/mL). A negative result must be combined with clinical observations, patient history, and epidemiological information. The expected result is Negative. Fact Sheet for Patients:  PinkCheek.be Fact Sheet for Healthcare Providers:  GravelBags.it This test is not yet ap proved or cleared by the [place] FDA and  has been authorized for detection and/or diagnosis of SARS-CoV-2 by FDA under an Emergency Use Authorization (EUA). This EUA will remain  in effect (meaning this test can be used) for the duration of the COVID-19 declaration under Section 564(b)(1) of the Act, 21 U.S.C. section 360bbb-3(b)(1), unless the authorization is terminated or revoked sooner.    Influenza A by PCR NEGATIVE NEGATIVE Final   Influenza B by PCR NEGATIVE NEGATIVE Final    Comment: (NOTE) The Xpert Xpress SARS-CoV-2/FLU/RSV assay is intended as an aid in  the diagnosis of influenza from Nasopharyngeal swab specimens and  should not be used as a sole basis for treatment. Nasal washings and  aspirates are unacceptable for Xpert Xpress SARS-CoV-2/FLU/RSV  testing. Fact Sheet for Patients: PinkCheek.be Fact Sheet for Healthcare Providers: GravelBags.it This test is not yet approved or cleared by the [place] FDA and  has been authorized for detection and/or diagnosis of SARS-CoV-2 by  FDA under an Emergency  Use Authorization (EUA). This EUA will remain  in effect (meaning this test can be used) for the duration of the  Covid-19 declaration under Section 564(b)(1) of the Act, 21  U.S.C. section 360bbb-3(b)(1), unless the authorization is  terminated or revoked.  Performed at [facility], [address] [place], [place] [address]      Labs: BNP (last 3 results) No results for input(s): BNP in the last 8760 hours. Basic Metabolic Panel: Recent Labs  Lab 04/17/19 1731 04/18/19 0508  NA 142 142  K 3.3* 4.1  CL 112* 111  CO2 20* 23  GLUCOSE 145* 121*  BUN 16 18  CREATININE 0.83 0.76  CALCIUM 8.6* 8.9   Liver Function Tests: No results for input(s): AST, ALT, ALKPHOS, BILITOT, PROT, ALBUMIN in the last 168 hours. No results for input(s): LIPASE, AMYLASE in the last 168 hours. No results for input(s): AMMONIA in the last 168 hours. CBC: Recent Labs  Lab 04/17/19 1731  WBC 6.0  NEUTROABS 5.5  HGB 13.9  HCT 43.6  MCV 93.2  PLT 229   Cardiac Enzymes: No results for input(s): CKTOTAL, CKMB, CKMBINDEX, TROPONINI in the last 168 hours. BNP: Invalid input(s): POCBNP CBG: No results for input(s): GLUCAP in the last 168 hours. D-Dimer No results for input(s): DDIMER in the last 72 hours. Hgb A1c No results for input(s): HGBA1C in the last 72 hours. Lipid Profile No results for input(s): CHOL, HDL, LDLCALC, TRIG, CHOLHDL, LDLDIRECT in the last 72 hours. Thyroid function studies No results for input(s): TSH, T4TOTAL, T3FREE, THYROIDAB in the last 72 hours.  Invalid input(s): FREET3 Anemia work up No results for input(s): VITAMINB12, FOLATE, FERRITIN, TIBC, IRON, RETICCTPCT in the last 72 hours. Urinalysis    Component Value Date/Time   COLORURINE AMBER (A) 12/15/2017 1445   APPEARANCEUR CLOUDY (A) 12/15/2017 1445   LABSPEC 1.020 12/15/2017 1445   PHURINE 6.0 12/15/2017 1445   GLUCOSEU NEGATIVE 12/15/2017 1445   HGBUR LARGE (A) 12/15/2017 1445   BILIRUBINUR NEGATIVE  12/15/2017 1445   KETONESUR NEGATIVE 12/15/2017 1445   PROTEINUR 30 (A) 12/15/2017 1445   UROBILINOGEN 0.2 06/24/2011 0858   NITRITE NEGATIVE 12/15/2017 1445   LEUKOCYTESUR NEGATIVE 12/15/2017 1445   Sepsis Labs Invalid input(s): PROCALCITONIN,  WBC,  LACTICIDVEN Microbiology Recent Results (from the past 240 hour(s))  Respiratory Panel by RT PCR (Flu A&B, Covid) - Nasopharyngeal Swab     Status: None   Collection Time: 04/17/19 12:46 PM   Specimen: Nasopharyngeal Swab  Result Value Ref Range Status   SARS Coronavirus 2 by RT PCR NEGATIVE NEGATIVE Final    Comment: (NOTE) SARS-CoV-2 target nucleic acids are NOT DETECTED. The SARS-CoV-2 RNA is generally detectable in upper respiratoy specimens during the acute phase of infection. The lowest concentration of SARS-CoV-2 viral copies this assay can detect is 131 copies/mL. A negative result does not preclude SARS-Cov-2 infection and should not be used as the sole basis for treatment or other patient management decisions. A negative result may occur with  improper specimen collection/handling, submission of specimen other than nasopharyngeal swab, presence of viral mutation(s) within the areas targeted by this assay, and inadequate number of viral copies (<131 copies/mL). A negative result must be combined with clinical observations, patient history, and epidemiological information. The expected result is Negative. Fact Sheet for Patients:  https://www.moore.com/ Fact Sheet for Healthcare Providers:  https://www.young.biz/ This test is not yet ap proved or cleared by the [place] FDA and  has been authorized for detection and/or diagnosis of SARS-CoV-2 by FDA under an Emergency Use Authorization (EUA). This EUA will remain  in effect (meaning this test can be used) for the duration of the COVID-19 declaration under Section 564(b)(1) of the Act, 21 U.S.C. section 360bbb-3(b)(1), unless the  authorization is terminated or revoked sooner.    Influenza A by PCR NEGATIVE NEGATIVE Final   Influenza B by PCR NEGATIVE NEGATIVE Final    Comment: (NOTE) The Xpert Xpress SARS-CoV-2/FLU/RSV assay is intended as an aid in  the diagnosis of influenza from Nasopharyngeal swab specimens and  should not be used as a sole basis for treatment. Nasal washings and  aspirates are unacceptable for Xpert Xpress SARS-CoV-2/FLU/RSV  testing. Fact Sheet for Patients: https://www.moore.com/https://www.fda.gov/media/142436/download Fact Sheet for Healthcare Providers: https://www.young.biz/https://www.fda.gov/media/142435/download This test is not yet approved or cleared by the [place] FDA and  has been authorized for detection and/or diagnosis of SARS-CoV-2 by  FDA under an Emergency Use Authorization (EUA). This EUA will remain  in effect (meaning this test can be used) for the duration of the  Covid-19 declaration under Section 564(b)(1) of the Act, 21  U.S.C. section 360bbb-3(b)(1), unless the authorization is  terminated or revoked. Performed at [facility], [address] [place], [place] [id number]    Time coordinating discharge: 33 minutes   SIGNED:  Standley Dakinslanford , MD  [facility] 04/19/2019, 9:48 AM How to contact the [facility] Attending or Consulting provider 7A - 7P or covering provider during after hours 7P -7A, for this patient?  1. Check the care team in [facility] and look for a) attending/consulting [facility] provider listed and b) the [facility] team listed 2. Log into [email or web address] and use [place]'s universal password to access. If you do not have the password, please contact the hospital operator. 3. Locate the [facility] provider you are looking for under [facility] and page to a number that you can be directly reached. 4. If you still have difficulty reaching the provider, please page the [facility] (Director on Call) for the Hospitalists listed on amion for assistance.

## 2019-04-20 ENCOUNTER — Encounter: Payer: Self-pay | Admitting: Internal Medicine

## 2019-04-20 ENCOUNTER — Other Ambulatory Visit: Payer: Self-pay

## 2019-04-20 ENCOUNTER — Ambulatory Visit: Payer: BC Managed Care – PPO | Admitting: Internal Medicine

## 2019-04-20 DIAGNOSIS — I471 Supraventricular tachycardia: Secondary | ICD-10-CM | POA: Diagnosis not present

## 2019-04-20 DIAGNOSIS — J4551 Severe persistent asthma with (acute) exacerbation: Secondary | ICD-10-CM

## 2019-04-20 MED ORDER — PREDNISONE 20 MG PO TABS: ORAL_TABLET | ORAL | 0 refills | Status: DC

## 2019-04-20 MED ORDER — ALBUTEROL SULFATE (2.5 MG/3ML) 0.083% IN NEBU: 2.5000 mg | INHALATION_SOLUTION | Freq: Four times a day (QID) | RESPIRATORY_TRACT | 5 refills | Status: DC | PRN

## 2019-04-20 NOTE — Assessment & Plan Note (Addendum)
He questions if he had an adverse reaction to [facility] Nov 3, but increased dyspnea began 2-3 days after that, and no recognized problem with most recent dose Dec 4. No obvious infection. Question role of much colder weather.  Plan- finish current taper and continue routine meds. Pred taper to hold during holidays.

## 2019-04-20 NOTE — Assessment & Plan Note (Signed)
Has not recognized palpitation or inappropriate tachycardia

## 2019-04-20 NOTE — Progress Notes (Signed)
Patient ID: Drew Sandoval, male    DOB: 19-Apr-1976    MRN: [id number]  HPI M  former smoker followed for severe asthma/ COPD, chronic sinusitis, hyper-eosinophilia and hyper IgE, complicated by history SVT/cardioversion FENO 03/18/16- 100  High, indicates allergic asthma Office Spirometry 03/18/2016-severe obstructive airways disease FVC 2.62/51%, FEV1 1.30/31%, FEV1/FVC 0.50, FEF 25-75 0.61/14% Office Spirometry 01/25/17-severe obstructive airways disease. FVC 3.52/68%, FEV1 1.80/43%, ratio or 0.51, FEF 25-75% 0.76/18% IgE was too high for Xolair. He could not afford Nucala or allergy vaccine. Cinqair infusion@ [facility]- ordered 07/21/17 ----------------------------------------------------  01/16/2019- 43 year old male former smoker followed for severe asthma/ COPD/ Cinqair, chronic sinusitis, persistent eosinophilia, hyper IgE-too high for Xolair, history SVT/cardioversion, Kidney stone -----pt states his breathing has been "excellent" since LOV; takes Trelegy daily Gets Cinqair infusions at [facility] every 30 days. Hasn't needed rescue inhaler in long time. No concerns and denies need for refills.   04/20/2019- 43 year old male former smoker followed for severe asthma/ COPD/ Cinqair, chronic sinusitis, persistent eosinophilia, hyper IgE-too high for Xolair, history SVT/cardioversion, Kidney stone [facility] 12/8-12/10 Asthma and stated prednisone taper on discharge. Continues Cinqair Good control of asthma over the past year since starting Cinqair. Got a dose early November and a few days later began feeling tight, wheezy, SOB without recognized infection, CP or palpitations. Got 2 pred tapers and 2 ED visits. Usual meds only helped "some".Felt "fine" at rest but DOE Finally admitted 12/8 and since discharge feels well on prednisone taper.  Asks work note 12/8 to return unrestricted 12/14.  ROS-see HPI  "+" = positive Constitutional:    weight loss, night sweats, fevers, chills, fatigue,  lassitude. HEENT:    headaches, difficulty swallowing, tooth/dental problems, sore throat,       sneezing, itching, ear ache, +nasal congestion, +post nasal drip, snoring CV:    chest pain, orthopnea, PND, swelling in lower extremities, anasarca,                                                      dizziness, palpitations Resp:   +shortness of breath with exertion or at rest.                productive cough,  non-productive cough, coughing up of blood.              change in color of mucus.  wheezing.   Skin:    rash or lesions. GI:  No-   heartburn, indigestion, abdominal pain, nausea, vomiting,  GU: d. MS:   joint pain, stiffness, decreased range of motion, back pain. Neuro-     nothing unusual Psych:  change in mood or affect.  depression or anxiety.   memory loss.  Objective:   Physical Exam General- Alert, Oriented, Affect-reserved/ laconic, Distress-none acute;  Tall, thin Skin- +tatoos Lymphadenopathy- none Head- atraumatic            Eyes- Gross vision intact, PERRLA, conjunctivae clear secretions            Ears- Hearing, canals normal            Nose- Clear, no-Septal dev, mucus, polyps, erosion, perforation             Throat- Mallampati II , mucosa clear , drainage- none, tonsils- atrophic Neck- flexible , trachea midline, no stridor , thyroid nl, carotid no bruit  Chest - symmetrical excursion , unlabored           Heart/CV- RRR  , no murmur , no gallop  , no rub, nl s1 s2                           - JVD- none , edema- none, stasis changes- none, varices- none           Lung-   Diminished/ clear, unlabored,  Cough-none, dullness-none, rub- none,  Wheeze-none Abd- scaphoid  Br/ Gen/ Rectal- Not done, not indicated Extrem- cyanosis- none, clubbing, none, atrophy- none, strength- nl Neuro- +slight tremor

## 2019-04-20 NOTE — Patient Instructions (Signed)
Scripts set refilling your nebulizer solutions and for an extra prednisone taper to hold on to for the holidays.  It isn't clear if your Cinqair infusions have become a problem. Since they worked for so long, I suggest you continue them as scheduled and watch to see what happens.  Please call as needed

## 2019-05-14 ENCOUNTER — Encounter (HOSPITAL_COMMUNITY): Admission: RE | Admit: 2019-05-14 | Payer: BC Managed Care – PPO | Source: Ambulatory Visit

## 2019-05-18 ENCOUNTER — Encounter (HOSPITAL_COMMUNITY)
Admission: RE | Admit: 2019-05-18 | Discharge: 2019-05-18 | Disposition: A | Discharge status: Home / Self Care | Payer: BC Managed Care – PPO | Source: Ambulatory Visit | Attending: Internal Medicine | Admitting: Internal Medicine

## 2019-05-18 ENCOUNTER — Other Ambulatory Visit: Payer: Self-pay

## 2019-05-18 DIAGNOSIS — J4550 Severe persistent asthma, uncomplicated: Secondary | ICD-10-CM | POA: Diagnosis not present

## 2019-05-18 MED ORDER — SODIUM CHLORIDE 0.9 % IV SOLN
228.3000 mg | Freq: Once | INTRAVENOUS | Status: AC
  Administered 2019-05-18: 228.3 mg via INTRAVENOUS
  Filled 2019-05-18: qty 22.83

## 2019-05-18 MED ORDER — SODIUM CHLORIDE 0.9 % IV SOLN: Freq: Once | INTRAVENOUS | Status: AC

## 2019-05-23 ENCOUNTER — Encounter: Payer: Self-pay | Admitting: Internal Medicine

## 2019-05-23 ENCOUNTER — Ambulatory Visit (INDEPENDENT_AMBULATORY_CARE_PROVIDER_SITE_OTHER): Payer: BC Managed Care – PPO | Admitting: Internal Medicine

## 2019-05-23 ENCOUNTER — Telehealth: Payer: Self-pay | Admitting: Internal Medicine

## 2019-05-23 DIAGNOSIS — I471 Supraventricular tachycardia: Secondary | ICD-10-CM

## 2019-05-23 DIAGNOSIS — J4551 Severe persistent asthma with (acute) exacerbation: Secondary | ICD-10-CM

## 2019-05-23 MED ORDER — PREDNISONE 10 MG PO TABS: ORAL_TABLET | ORAL | 0 refills | Status: DC

## 2019-05-23 NOTE — Progress Notes (Signed)
Patient ID: Drew Sandoval, male    DOB: [birth year]    MRN: [id number]  HPI M  former smoker followed for severe asthma/ COPD, chronic sinusitis, hyper-eosinophilia and hyper IgE, complicated by history SVT/cardioversion FENO 03/18/16- 100  High, indicates allergic asthma Office Spirometry 03/18/2016-severe obstructive airways disease FVC 2.62/51%, FEV1 1.30/31%, FEV1/FVC 0.50, FEF 25-75 0.61/14% Office Spirometry 01/25/17-severe obstructive airways disease. FVC 3.52/68%, FEV1 1.80/43%, ratio or 0.51, FEF 25-75% 0.76/18% IgE was too high for Xolair. He could not afford Nucala or allergy vaccine. Cinqair infusion@ [facility]- ordered 07/21/17 ----------------------------------------------------  04/20/2019- 44 year old male former smoker followed for severe asthma/ COPD/ Cinqair, chronic sinusitis, persistent eosinophilia, hyper IgE-too high for Xolair, history SVT/cardioversion, Kidney stone [facility] 12/8-12/10 Asthma and stated prednisone taper on discharge. Continues Cinqair Good control of asthma over the past year since starting Cinqair. Got a dose early November and a few days later began feeling tight, wheezy, SOB without recognized infection, CP or palpitations. Got 2 pred tapers and 2 ED visits. Usual meds only helped "some".Felt "fine" at rest but DOE Finally admitted 12/8 and since discharge feels well on prednisone taper.  Asks work note 12/8 to return unrestricted 12/14.  05/23/19- Virtual Visit via Telephone Note  I connected with Drew Sandoval on 05/23/19 at 11:30 AM EST by telephone and verified that I am speaking with the correct person using two identifiers.  Location: Patient: H Provider: O   I discussed the limitations, risks, security and privacy concerns of performing an evaluation and management service by telephone and the availability of in person appointments. I also discussed with the patient that there may be a patient responsible charge related to this service. The  patient expressed understanding and agreed to proceed.   History of Present Illness: 44 year old male former smoker followed for severe asthma/ COPD/ Cinqair, chronic sinusitis, persistent eosinophilia, hyper IgE-too high for Xolair, history SVT/cardioversion, Kidney stone [facility] 12/8-12/10 Asthma and stated prednisone taper on discharge. Continues Cinqair -----Pt c/o chest congestion and wheezing. Pt states he was around a child that had a cold and thinks he got it Reports cough, wheeze starting 4 days ago. No fever, anosmia or purulent mucus. Used neb last night, rescue x 3 yesterday.  Observations/Objective: CXR 04/17/2019 1V- Heart size and pulmonary vascularity are normal. Lungs are clear. No effusions. No bone abnormality. IMPRESSION: No active disease.  Assessment and Plan: Asthma exacerbation- Plan prednisone taper, call if no better Hx SVT- no recurrence so far  Follow Up Instructions: Keep appt   I discussed the assessment and treatment plan with the patient. The patient was provided an opportunity to ask questions and all were answered. The patient agreed with the plan and demonstrated an understanding of the instructions.   The patient was advised to call back or seek an in-person evaluation if the symptoms worsen or if the condition fails to improve as anticipated.  I provided 17 minutes of non-face-to-face time during this encounter.   Jetty Duhamel, MD    ROS-see HPI  "+" = positive Constitutional:    weight loss, night sweats, fevers, chills, fatigue, lassitude. HEENT:    headaches, difficulty swallowing, tooth/dental problems, sore throat,       sneezing, itching, ear ache, +nasal congestion, +post nasal drip, snoring CV:    chest pain, orthopnea, PND, swelling in lower extremities, anasarca,  dizziness, palpitations Resp:   +shortness of breath with exertion or at rest.                productive cough,   non-productive cough, coughing up of blood.              change in color of mucus.  wheezing.   Skin:    rash or lesions. GI:  No-   heartburn, indigestion, abdominal pain, nausea, vomiting,  GU: d. MS:   joint pain, stiffness, decreased range of motion, back pain. Neuro-     nothing unusual Psych:  change in mood or affect.  depression or anxiety.   memory loss.  Objective:   Physical Exam General- Alert, Oriented, Affect-reserved/ laconic, Distress-none acute;  Tall, thin Skin- +tatoos Lymphadenopathy- none Head- atraumatic            Eyes- Gross vision intact, PERRLA, conjunctivae clear secretions            Ears- Hearing, canals normal            Nose- Clear, no-Septal dev, mucus, polyps, erosion, perforation             Throat- Mallampati II , mucosa clear , drainage- none, tonsils- atrophic Neck- flexible , trachea midline, no stridor , thyroid nl, carotid no bruit Chest - symmetrical excursion , unlabored           Heart/CV- RRR  , no murmur , no gallop  , no rub, nl s1 s2                           - JVD- none , edema- none, stasis changes- none, varices- none           Lung-   Diminished/ clear, unlabored,  Cough-none, dullness-none, rub- none,  Wheeze-none Abd- scaphoid  Br/ Gen/ Rectal- Not done, not indicated Extrem- cyanosis- none, clubbing, none, atrophy- none, strength- nl Neuro- +slight tremor

## 2019-05-23 NOTE — Assessment & Plan Note (Signed)
Likely typical viral URI from child contact. Don't currently suspect Covid. Plan- Prednisone taper. Continue routine meds and Cinqair.

## 2019-05-23 NOTE — Assessment & Plan Note (Signed)
So far no recurrence with asthma flare.  F/U w cardiology as directed

## 2019-05-23 NOTE — Telephone Encounter (Signed)
Called and spoke to pt. Pt c/o chest congestion and wheezing. Pt states he was around a child that had a cold and thinks he got it. Pt states he does not want to wait for Friday 1/15 when his current appt is scheduled for. Per CY ok to use hold spot for today at 1130, pt has been scheduled for a televisit at that time. Pt verbalized understanding and denied any further questions or concerns at this time.

## 2019-05-23 NOTE — Patient Instructions (Signed)
Sorry to hear you got sick.   Prednisone taper sent  This is almost certainly a viral infection, so antibiotics won't help. If you start running fever, green sputum etc please let us know.  We can keep your pending appointment

## 2019-05-24 ENCOUNTER — Encounter: Payer: Self-pay | Admitting: *Deleted

## 2019-05-24 ENCOUNTER — Telehealth: Payer: Self-pay | Admitting: Internal Medicine

## 2019-05-24 NOTE — Telephone Encounter (Signed)
Spoke with the pt and confirmed dates  Letter up front for pick up

## 2019-05-24 NOTE — Telephone Encounter (Signed)
Yes- I think he went out on Tuesday. Thanks.

## 2019-05-24 NOTE — Telephone Encounter (Signed)
Dr Maple Hudson please advise if okay for work note to return Monday 1/18 without any restrictions, thanks!

## 2019-05-25 ENCOUNTER — Ambulatory Visit: Payer: BC Managed Care – PPO | Admitting: Internal Medicine

## 2019-06-14 ENCOUNTER — Other Ambulatory Visit: Payer: Self-pay

## 2019-06-14 ENCOUNTER — Emergency Department (HOSPITAL_COMMUNITY): Payer: BC Managed Care – PPO

## 2019-06-14 ENCOUNTER — Encounter (HOSPITAL_COMMUNITY): Payer: Self-pay

## 2019-06-14 ENCOUNTER — Emergency Department (HOSPITAL_COMMUNITY)
Admission: EM | Admit: 2019-06-14 | Discharge: 2019-06-14 | Disposition: A | Discharge status: Home / Self Care | Payer: BC Managed Care – PPO | Attending: Emergency Medicine | Admitting: Emergency Medicine

## 2019-06-14 DIAGNOSIS — Z87891 Personal history of nicotine dependence: Secondary | ICD-10-CM | POA: Diagnosis not present

## 2019-06-14 DIAGNOSIS — J45901 Unspecified asthma with (acute) exacerbation: Secondary | ICD-10-CM | POA: Diagnosis not present

## 2019-06-14 DIAGNOSIS — Z20822 Contact with and (suspected) exposure to covid-19: Secondary | ICD-10-CM | POA: Diagnosis not present

## 2019-06-14 DIAGNOSIS — R0602 Shortness of breath: Secondary | ICD-10-CM | POA: Diagnosis not present

## 2019-06-14 MED ORDER — DEXAMETHASONE 4 MG PO TABS
12.0000 mg | ORAL_TABLET | Freq: Once | ORAL | Status: AC
  Administered 2019-06-14: 12 mg via ORAL
  Filled 2019-06-14: qty 3

## 2019-06-14 MED ORDER — ALBUTEROL SULFATE HFA 108 (90 BASE) MCG/ACT IN AERS
6.0000 | INHALATION_SPRAY | Freq: Once | RESPIRATORY_TRACT | Status: AC
  Administered 2019-06-14: 21:00:00 6 via RESPIRATORY_TRACT
  Filled 2019-06-14: qty 6.7

## 2019-06-14 MED ORDER — PREDNISONE 20 MG PO TABS: 40.0000 mg | ORAL_TABLET | Freq: Every day | ORAL | 0 refills | Status: DC

## 2019-06-14 NOTE — ED Triage Notes (Signed)
Pt present to the ED with c/o asthma flare up. Pt says he has had sob x one month. Pt reports his oxygen level was 88% at home and he "couldn't breathe." pt says he used his inhaler prior to checking O2 sats, and then when he saw it was 88%, he "just came up here." NAD noted.

## 2019-06-15 ENCOUNTER — Encounter (HOSPITAL_COMMUNITY): Payer: BC Managed Care – PPO

## 2019-06-15 LAB — SARS CORONAVIRUS 2 (TAT 6-24 HRS): SARS Coronavirus 2: NEGATIVE

## 2019-06-18 ENCOUNTER — Encounter (HOSPITAL_COMMUNITY)
Admission: RE | Admit: 2019-06-18 | Discharge: 2019-06-18 | Disposition: A | Discharge status: Home / Self Care | Payer: BC Managed Care – PPO | Source: Ambulatory Visit | Attending: Internal Medicine | Admitting: Internal Medicine

## 2019-06-18 ENCOUNTER — Other Ambulatory Visit: Payer: Self-pay

## 2019-06-18 ENCOUNTER — Encounter (HOSPITAL_COMMUNITY): Payer: Self-pay

## 2019-06-18 DIAGNOSIS — J4550 Severe persistent asthma, uncomplicated: Secondary | ICD-10-CM | POA: Insufficient documentation

## 2019-06-18 MED ORDER — SODIUM CHLORIDE 0.9 % IV SOLN
228.3000 mg | Freq: Once | INTRAVENOUS | Status: AC
  Administered 2019-06-18: 09:00:00 228.3 mg via INTRAVENOUS
  Filled 2019-06-18: qty 22.83

## 2019-06-18 MED ORDER — SODIUM CHLORIDE 0.9 % IV SOLN: Freq: Once | INTRAVENOUS | Status: AC

## 2019-06-19 NOTE — ED Provider Notes (Signed)
[facility] EMERGENCY DEPARTMENT Provider Note   CSN: [id number] Arrival date & time: 06/14/19  1925     History Chief Complaint  Patient presents with  . Asthma    Clary Meeker is a 44 y.o. male.  HPI   44 year old male with shortness of breath and wheezing.  History of asthma.  He feels like he is not having an exacerbation.  Has been using his bronchodilators at home with only mild improvement.  Productive cough.  No fevers or chills.  Chest feels tight but not really painful per se.  No unusual leg pain or swelling.  Denies any acute GI symptoms.  Past Medical History:  Diagnosis Date  . Asthma   . Seasonal allergies     Patient Active Problem List   Diagnosis Date Noted  . Persistent asthma with acute exacerbation 04/17/2019  . Hypokalemia 03/10/2016  . Hypoxia 10/28/2014  . Acute conjunctivitis of right eye 10/07/2014  . Sustained SVT (HCC) 09/24/2014  . SVT (supraventricular tachycardia) (HCC) 09/24/2014  . Malnutrition of moderate degree (HCC) 09/24/2014  . Underweight 09/24/2014  . Seasonal and perennial allergic rhinitis 07/17/2014  . Acute respiratory failure with hypoxia (HCC) 07/23/2013  . 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 Eczema, allergic 07/22/2011  . Status asthmaticus 10/19/2010  . Sinusitis, maxillary, 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  . 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 Asthma, severe persistent 07/16/2010    Past Surgical History:  Procedure Laterality Date  . None         Family History  Problem Relation Age of Onset  . Emphysema Father   . Lung cancer Mother   . Arthritis Sister     Social History   Tobacco Use  . Smoking status: Former Smoker    Packs/day: 0.50    Years: 11.50    Pack years: 5.75    Types: Cigarettes    Quit date: 04/09/2010    Years since quitting: 9.2  . Smokeless tobacco: Former Neurosurgeon  . Tobacco comment: started at age 71.  1/2 ppd.    Substance Use Topics  . Alcohol use: Yes    Comment: rarely  . Drug use:  No    Home Medications Prior to Admission medications   Medication Sig Start Date End Date Taking? Authorizing Provider  albuterol (PROVENTIL) (2.5 MG/3ML) 0.083% nebulizer solution Take 3 mLs (2.5 mg total) by nebulization every 6 (six) hours as needed for wheezing or shortness of breath. 04/20/19   [name], MD  albuterol (VENTOLIN HFA) 108 (90 Base) MCG/ACT inhaler Inhale 2 puffs into the lungs every 6 (six) hours as needed for wheezing or shortness of breath. 04/19/19   Johnson, Clanford L, MD  EPINEPHrine 0.3 mg/0.3 mL IJ SOAJ injection Inject into thigh for severe allergic reaction Patient taking differently: Inject 0.3 mg into the muscle once. Inject into thigh for severe allergic reaction 04/29/15   Young, Joni Fears D, MD  montelukast (SINGULAIR) 10 MG tablet TAKE 1 TABLET BY MOUTH EVERYDAY AT BEDTIME Patient taking differently: Take 10 mg by mouth at bedtime.  02/14/19   Jetty Duhamel D, MD  predniSONE (DELTASONE) 10 MG tablet 4 X 2 DAYS, 3 X 2 DAYS, 2 X 2 DAYS, 1 X 2 DAYS 05/23/19   Jetty Duhamel D, MD  predniSONE (DELTASONE) 20 MG tablet Take 2 tablets (40 mg total) by mouth daily. 06/14/19   Raeford Razor, MD  primidone (MYSOLINE) 50 MG tablet Take 1 tablet (50 mg total) by mouth 2 (two) times daily. 03/20/19  Tat, Octaviano Batty, DO  Reslizumab (CINQAIR IV) Inject 228.3 mg into the vein every 30 (thirty) days.    [provider]  TRELEGY ELLIPTA 100-62.5-25 MCG/INH AEPB INHALE 1 PUFF INTO THE LUNGS DAILY. RINSE MOUTH Patient taking differently: Inhale 1 puff into the lungs daily.  02/22/19   [name], MD    Allergies    Azithromycin, Alvesco [ciclesonide], Anoro ellipta [umeclidinium-vilanterol], Banana, Methylprednisolone, and Penicillins  Review of Systems   Review of Systems All systems reviewed and negative, other than as noted in HPI.  Physical Exam Updated Vital Signs BP 129/83   Pulse 70   Temp 98.6 F (37 C) (Oral)   Resp 18   Ht 6\' 3"  (1.905  m)   Wt 76.2 kg   SpO2 90%   BMI 21.00 kg/m   Physical Exam Vitals and nursing note reviewed.  Constitutional:      General: He is not in acute distress.    Appearance: He is well-developed.  HENT:     Head: Normocephalic and atraumatic.  Eyes:     General:        Right eye: No discharge.        Left eye: No discharge.     Conjunctiva/sclera: Conjunctivae normal.  Cardiovascular:     Rate and Rhythm: Normal rate and regular rhythm.     Heart sounds: Normal heart sounds. No murmur. No friction rub. No gallop.   Pulmonary:     Effort: Pulmonary effort is normal. No respiratory distress.     Breath sounds: Wheezing present.  Abdominal:     General: There is no distension.     Palpations: Abdomen is soft.     Tenderness: There is no abdominal tenderness.  Musculoskeletal:        General: No tenderness.     Cervical back: Neck supple.  Skin:    General: Skin is warm and dry.  Neurological:     Mental Status: He is alert.  Psychiatric:        Behavior: Behavior normal.        Thought Content: Thought content normal.     ED Results / Procedures / Treatments   Labs (all labs ordered are listed, but only abnormal results are displayed) Labs Reviewed  SARS CORONAVIRUS 2 (TAT 6-24 HRS)    EKG None  Radiology No results found.   DG Chest Portable 1 View  Result Date: 06/14/2019 CLINICAL DATA:  Shortness of breath for 1 month and hypoxia EXAM: PORTABLE CHEST 1 VIEW COMPARISON:  04/17/2019 FINDINGS: Cardiac shadow is within normal limits. The lungs are well aerated bilaterally. No focal infiltrate or sizable effusion is seen. No bony abnormality is noted. IMPRESSION: No active disease. Electronically Signed   By: 14/12/2018 M.D.   On: 06/14/2019 22:09    Procedures Procedures (including critical care time)  Medications Ordered in ED Medications  albuterol (VENTOLIN HFA) 108 (90 Base) MCG/ACT inhaler 6 puff (6 puffs Inhalation Given 06/14/19 2128)  dexamethasone  (DECADRON) tablet 12 mg (12 mg Oral Given 06/14/19 2128)    ED Course  I have reviewed the triage vital signs and the nursing notes.  Pertinent labs & imaging results that were available during my care of the patient were reviewed by me and considered in my medical decision making (see chart for details).    MDM Rules/Calculators/A&P                      44 year old  male with what I suspect is an asthma exacerbation.  O2 sat somewhat low on room air but he looks comfortable.  Symptoms improved here in the emergency room.  Doubt Covid but will send testing.  He states that he has sufficient quantities of his home pulmonary medications.  We will give him a course of steroids.  Return precautions were discussed.  Outpatient follow-up otherwise.  Doubt serious infection, PE dissection, significant anemia or other emergent process.  Final Clinical Impression(s) / ED Diagnoses Final diagnoses:  Exacerbation of asthma, unspecified asthma severity, unspecified whether persistent    Rx / DC Orders ED Discharge Orders         Ordered    predniSONE (DELTASONE) 20 MG tablet  Daily     06/14/19 2255           Virgel Manifold, MD 06/19/19 [phone]

## 2019-06-30 ENCOUNTER — Other Ambulatory Visit: Payer: Self-pay

## 2019-06-30 ENCOUNTER — Encounter (HOSPITAL_COMMUNITY): Payer: Self-pay | Admitting: Emergency Medicine

## 2019-06-30 ENCOUNTER — Observation Stay (HOSPITAL_COMMUNITY)
Admission: EM | Admit: 2019-06-30 | Discharge: 2019-07-01 | Disposition: A | Discharge status: Home / Self Care | Payer: BC Managed Care – PPO | Attending: Internal Medicine | Admitting: Internal Medicine

## 2019-06-30 ENCOUNTER — Emergency Department (HOSPITAL_COMMUNITY): Payer: BC Managed Care – PPO

## 2019-06-30 DIAGNOSIS — Z20822 Contact with and (suspected) exposure to covid-19: Secondary | ICD-10-CM | POA: Diagnosis not present

## 2019-06-30 DIAGNOSIS — Z79899 Other long term (current) drug therapy: Secondary | ICD-10-CM | POA: Insufficient documentation

## 2019-06-30 DIAGNOSIS — D824 Hyperimmunoglobulin E [IgE] syndrome: Secondary | ICD-10-CM | POA: Diagnosis not present

## 2019-06-30 DIAGNOSIS — Z87891 Personal history of nicotine dependence: Secondary | ICD-10-CM | POA: Diagnosis not present

## 2019-06-30 DIAGNOSIS — R0602 Shortness of breath: Secondary | ICD-10-CM | POA: Diagnosis not present

## 2019-06-30 DIAGNOSIS — I471 Supraventricular tachycardia, unspecified: Secondary | ICD-10-CM | POA: Diagnosis present

## 2019-06-30 DIAGNOSIS — J45901 Unspecified asthma with (acute) exacerbation: Secondary | ICD-10-CM | POA: Diagnosis not present

## 2019-06-30 DIAGNOSIS — J4521 Mild intermittent asthma with (acute) exacerbation: Secondary | ICD-10-CM | POA: Diagnosis not present

## 2019-06-30 LAB — CBC WITH DIFFERENTIAL/PLATELET
Abs Immature Granulocytes: 0.07 10*3/uL (ref 0.00–0.07)
Basophils Absolute: 0.1 10*3/uL (ref 0.0–0.1)
Basophils Relative: 1 %
Eosinophils Absolute: 0.1 10*3/uL (ref 0.0–0.5)
Eosinophils Relative: 1 %
HCT: 45.8 % (ref 39.0–52.0)
Hemoglobin: 15 g/dL (ref 13.0–17.0)
Immature Granulocytes: 1 %
Lymphocytes Relative: 21 %
Lymphs Abs: 1.2 10*3/uL (ref 0.7–4.0)
MCH: 30.2 pg (ref 26.0–34.0)
MCHC: 32.8 g/dL (ref 30.0–36.0)
MCV: 92.3 fL (ref 80.0–100.0)
Monocytes Absolute: 0.7 10*3/uL (ref 0.1–1.0)
Monocytes Relative: 12 %
Neutro Abs: 3.5 10*3/uL (ref 1.7–7.7)
Neutrophils Relative %: 64 %
Platelets: 222 10*3/uL (ref 150–400)
RBC: 4.96 MIL/uL (ref 4.22–5.81)
RDW: 14 % (ref 11.5–15.5)
WBC: 5.5 10*3/uL (ref 4.0–10.5)
nRBC: 0 % (ref 0.0–0.2)

## 2019-06-30 LAB — COMPREHENSIVE METABOLIC PANEL
ALT: 13 U/L (ref 0–44)
AST: 14 U/L — ABNORMAL LOW (ref 15–41)
Albumin: 3.6 g/dL (ref 3.5–5.0)
Alkaline Phosphatase: 56 U/L (ref 38–126)
Anion gap: 6 (ref 5–15)
BUN: 11 mg/dL (ref 6–20)
CO2: 23 mmol/L (ref 22–32)
Calcium: 8.4 mg/dL — ABNORMAL LOW (ref 8.9–10.3)
Chloride: 110 mmol/L (ref 98–111)
Creatinine, Ser: 0.87 mg/dL (ref 0.61–1.24)
GFR calc Af Amer: >60 mL/min (ref >60)
GFR calc non Af Amer: >60 mL/min (ref >60)
Glucose, Bld: 89 mg/dL (ref 70–99)
Potassium: 4.1 mmol/L (ref 3.5–5.1)
Sodium: 139 mmol/L (ref 135–145)
Total Bilirubin: 0.6 mg/dL (ref 0.3–1.2)
Total Protein: 7.1 g/dL (ref 6.5–8.1)

## 2019-06-30 LAB — RESPIRATORY PANEL BY RT PCR (FLU A&B, COVID)
Influenza A by PCR: NEGATIVE
Influenza B by PCR: NEGATIVE
SARS Coronavirus 2 by RT PCR: NEGATIVE

## 2019-06-30 LAB — POC SARS CORONAVIRUS 2 AG -  ED: SARS Coronavirus 2 Ag: NEGATIVE

## 2019-06-30 MED ORDER — MONTELUKAST SODIUM 10 MG PO TABS
10.0000 mg | ORAL_TABLET | Freq: Every day | ORAL | Status: DC
  Administered 2019-06-30: 21:00:00 10 mg via ORAL
  Filled 2019-06-30: qty 1

## 2019-06-30 MED ORDER — METOPROLOL TARTRATE 5 MG/5ML IV SOLN
INTRAVENOUS | Status: AC
  Administered 2019-06-30: 20:00:00 5 mg via INTRAVENOUS
  Filled 2019-06-30: qty 5

## 2019-06-30 MED ORDER — ALBUTEROL SULFATE HFA 108 (90 BASE) MCG/ACT IN AERS: 4.0000 | INHALATION_SPRAY | RESPIRATORY_TRACT | Status: DC | PRN

## 2019-06-30 MED ORDER — HYDROCODONE-HOMATROPINE 5-1.5 MG/5ML PO SYRP
5.0000 mL | ORAL_SOLUTION | Freq: Once | ORAL | Status: AC
  Administered 2019-06-30: 21:00:00 5 mL via ORAL
  Filled 2019-06-30: qty 5

## 2019-06-30 MED ORDER — ALBUTEROL SULFATE (2.5 MG/3ML) 0.083% IN NEBU
2.5000 mg | INHALATION_SOLUTION | Freq: Four times a day (QID) | RESPIRATORY_TRACT | Status: DC
  Administered 2019-07-01 (×2): 2.5 mg via RESPIRATORY_TRACT
  Filled 2019-06-30 (×3): qty 3

## 2019-06-30 MED ORDER — METOPROLOL TARTRATE 5 MG/5ML IV SOLN: 5.0000 mg | Freq: Once | INTRAVENOUS | Status: AC

## 2019-06-30 MED ORDER — MAGNESIUM SULFATE 2 GM/50ML IV SOLN
2.0000 g | Freq: Once | INTRAVENOUS | Status: AC
  Administered 2019-06-30: 14:00:00 2 g via INTRAVENOUS
  Filled 2019-06-30: qty 50

## 2019-06-30 MED ORDER — PREDNISONE 50 MG PO TABS
60.0000 mg | ORAL_TABLET | Freq: Once | ORAL | Status: AC
  Administered 2019-06-30: 60 mg via ORAL
  Filled 2019-06-30: qty 1

## 2019-06-30 MED ORDER — IPRATROPIUM-ALBUTEROL 0.5-2.5 (3) MG/3ML IN SOLN: 3.0000 mL | Freq: Once | RESPIRATORY_TRACT | Status: DC

## 2019-06-30 MED ORDER — IPRATROPIUM-ALBUTEROL 0.5-2.5 (3) MG/3ML IN SOLN: 3.0000 mL | RESPIRATORY_TRACT | Status: DC | PRN

## 2019-06-30 MED ORDER — METHYLPREDNISOLONE SODIUM SUCC 125 MG IJ SOLR: 60.0000 mg | Freq: Two times a day (BID) | INTRAMUSCULAR | Status: DC

## 2019-06-30 MED ORDER — IPRATROPIUM-ALBUTEROL 0.5-2.5 (3) MG/3ML IN SOLN
3.0000 mL | Freq: Once | RESPIRATORY_TRACT | Status: AC
  Administered 2019-06-30: 17:00:00 3 mL via RESPIRATORY_TRACT
  Filled 2019-06-30: qty 3

## 2019-06-30 MED ORDER — ACETAMINOPHEN 650 MG RE SUPP: 650.0000 mg | Freq: Four times a day (QID) | RECTAL | Status: DC | PRN

## 2019-06-30 MED ORDER — POLYETHYLENE GLYCOL 3350 17 G PO PACK: 17.0000 g | PACK | Freq: Every day | ORAL | Status: DC | PRN

## 2019-06-30 MED ORDER — ALBUTEROL SULFATE HFA 108 (90 BASE) MCG/ACT IN AERS
4.0000 | INHALATION_SPRAY | Freq: Once | RESPIRATORY_TRACT | Status: AC
  Administered 2019-06-30: 14:00:00 4 via RESPIRATORY_TRACT
  Filled 2019-06-30: qty 6.7

## 2019-06-30 MED ORDER — ONDANSETRON HCL 4 MG PO TABS: 4.0000 mg | ORAL_TABLET | Freq: Four times a day (QID) | ORAL | Status: DC | PRN

## 2019-06-30 MED ORDER — ONDANSETRON HCL 4 MG/2ML IJ SOLN: 4.0000 mg | Freq: Four times a day (QID) | INTRAMUSCULAR | Status: DC | PRN

## 2019-06-30 MED ORDER — GUAIFENESIN-DM 100-10 MG/5ML PO SYRP
10.0000 mL | ORAL_SOLUTION | Freq: Three times a day (TID) | ORAL | Status: DC
  Administered 2019-06-30 – 2019-07-01 (×3): 10 mL via ORAL
  Filled 2019-06-30 (×3): qty 10

## 2019-06-30 MED ORDER — ENOXAPARIN SODIUM 40 MG/0.4ML ~~LOC~~ SOLN
40.0000 mg | SUBCUTANEOUS | Status: DC
  Administered 2019-06-30: 40 mg via SUBCUTANEOUS
  Filled 2019-06-30: qty 0.4

## 2019-06-30 MED ORDER — ALBUTEROL SULFATE (2.5 MG/3ML) 0.083% IN NEBU: 2.5000 mg | INHALATION_SOLUTION | Freq: Once | RESPIRATORY_TRACT | Status: DC

## 2019-06-30 MED ORDER — ACETAMINOPHEN 325 MG PO TABS: 650.0000 mg | ORAL_TABLET | Freq: Four times a day (QID) | ORAL | Status: DC | PRN

## 2019-06-30 MED ORDER — PREDNISONE 20 MG PO TABS
60.0000 mg | ORAL_TABLET | Freq: Every day | ORAL | Status: DC
  Administered 2019-07-01: 09:00:00 60 mg via ORAL
  Filled 2019-06-30: qty 3

## 2019-06-30 NOTE — ED Triage Notes (Signed)
Pt presents with shortness of breath at 2 am this morning. He was awaken from sleep with SOB. Pt used nebulizer at 2am with no relief.

## 2019-06-30 NOTE — H&P (Addendum)
History and Physical    Drew Sandoval GDJ:[id number] DOB: 11/08/1975 DOA: 06/30/2019  PCP: Kirstie Peri, MD   Patient coming from: Home  I have personally briefly reviewed patient's old medical records in [facility]  Chief Complaint: SOB  HPI: Drew Sandoval is a 44 y.o. male with medical history significant for  Asthma, Hyper IgE, SVT. Presented to the Ed with c/o Sob that started at about  2 a.m this morning.  He reports a dry cough also.  No specific chest pain, but his coughing and difficulty breathing causes some rib pain that is typical with his asthma exacerbations.  No leg pain or swelling.  Admission in December for same.  ED Course: O2 sats down to 86% on room air, > 92% with 3L [place]. EKG sinus rhythm, repolarization abnormality V3, V4, unchanged. Given 60mg  prednisone, magnesium sulphate and albuterol inh and duoneb, but with O2 requirement, hospitalist to admit for further management.  Review of Systems: As per HPI all other systems reviewed and negative.  Past Medical History:  Diagnosis Date  . Asthma   . Seasonal allergies     Past Surgical History:  Procedure Laterality Date  . None       reports that he quit smoking about 9 years ago. His smoking use included cigarettes. He has a 5.75 pack-year smoking history. He has quit using smokeless tobacco. He reports current alcohol use. He reports that he does not use drugs.  Allergies  Allergen Reactions  . Azithromycin Rash  . Alvesco [Ciclesonide]     Possible reaction- rash  . Anoro Ellipta [Umeclidinium-Vilanterol]     Possible- rash  . Banana Swelling    Throat swelling   . Methylprednisolone   . Penicillins Rash    Has patient had a PCN reaction causing immediate rash, facial/tongue/throat swelling, SOB or lightheadedness with hypotension: Yes Has patient had a PCN reaction causing severe rash involving mucus membranes or skin necrosis: No Has patient had a PCN reaction that required  hospitalization Yes Has patient had a PCN reaction occurring within the last 10 years: No If all of the above answers are "NO", then may proceed with Cephalosporin use.      Family History  Problem Relation Age of Onset  . Emphysema Father   . Lung cancer Mother   . Arthritis Sister     Prior to Admission medications   Medication Sig Start Date End Date Taking? Authorizing Provider  albuterol (PROVENTIL) (2.5 MG/3ML) 0.083% nebulizer solution Take 3 mLs (2.5 mg total) by nebulization every 6 (six) hours as needed for wheezing or shortness of breath. 04/20/19  Yes Young, 14/11/20 D, MD  albuterol (VENTOLIN HFA) 108 (90 Base) MCG/ACT inhaler Inhale 2 puffs into the lungs every 6 (six) hours as needed for wheezing or shortness of breath. 04/19/19  Yes Johnson, Clanford L, MD  EPINEPHrine 0.3 mg/0.3 mL IJ SOAJ injection Inject into thigh for severe allergic reaction Patient taking differently: Inject 0.3 mg into the muscle once. Inject into thigh for severe allergic reaction 04/29/15  Yes Young, Clinton D, MD  montelukast (SINGULAIR) 10 MG tablet TAKE 1 TABLET BY MOUTH EVERYDAY AT BEDTIME Patient taking differently: Take 10 mg by mouth at bedtime.  02/14/19  Yes Young, 04/16/19 D, MD  predniSONE (DELTASONE) 20 MG tablet Take 2 tablets (40 mg total) by mouth daily. 06/14/19  Yes 08/12/19, MD  primidone (MYSOLINE) 50 MG tablet Take 1 tablet (50 mg total) by mouth 2 (two) times daily. 03/20/19  Yes Tat, Eustace Quail, DO  Reslizumab (CINQAIR IV) Inject 228.3 mg into the vein every 30 (thirty) days.   Yes [provider]  TRELEGY ELLIPTA 100-62.5-25 MCG/INH AEPB INHALE 1 PUFF INTO THE LUNGS DAILY. RINSE MOUTH Patient taking differently: Inhale 1 puff into the lungs daily.  02/22/19  Yes Young, Tarri Fuller D, MD  predniSONE (DELTASONE) 10 MG tablet 4 X 2 DAYS, 3 X 2 DAYS, 2 X 2 DAYS, 1 X 2 DAYS 05/23/19   Baird Lyons D, MD    Physical Exam: Vitals:   06/30/19 1430 06/30/19 1500 06/30/19  1530 06/30/19 1545  BP: 133/90 128/86 (!) 138/96   Pulse: 89 80 76 70  Resp: (!) 22 (!) 21 20 15   SpO2: (!) 88% (!) 86% 92% 93%  Weight:      Height:        Constitutional: NAD, calm, comfortable Vitals:   06/30/19 1430 06/30/19 1500 06/30/19 1530 06/30/19 1545  BP: 133/90 128/86 (!) 138/96   Pulse: 89 80 76 70  Resp: (!) 22 (!) 21 20 15   SpO2: (!) 88% (!) 86% 92% 93%  Weight:      Height:       Eyes: , lids and conjunctivae normal ENMT: Mucous membranes are moist.   Neck: normal, supple, Respiratory: Currently on 2 L nasal cannula, mildly reduced air entry, but clear to auscultation bilaterally, no wheezing, no crackles. Normal respiratory effort. No accessory muscle use.  Cardiovascular: Regular rate and rhythm, no murmurs / rubs / gallops. No extremity edema. 2+ pedal pulses.   Abdomen: no tenderness, no masses palpated. No hepatosplenomegaly. Bowel sounds positive.  Musculoskeletal: no clubbing / cyanosis. No joint deformity upper and lower extremities. Good ROM, no contractures. Normal muscle tone.  Skin: no rashes, lesions, ulcers. No induration Neurologic: no Apparent cranial nerve abnormality, moving all extremities spontaneously.Marland Kitchen  Psychiatric: Normal judgment and insight. Alert and oriented x 3. Normal mood.   Labs on Admission: I have personally reviewed following labs and imaging studies  CBC: Recent Labs  Lab 06/30/19 1348  WBC 5.5  NEUTROABS 3.5  HGB 15.0  HCT 45.8  MCV 92.3  PLT 353   Basic Metabolic Panel: Recent Labs  Lab 06/30/19 1348  NA 139  K 4.1  CL 110  CO2 23  GLUCOSE 89  BUN 11  CREATININE 0.87  CALCIUM 8.4*   Liver Function Tests: Recent Labs  Lab 06/30/19 1348  AST 14*  ALT 13  ALKPHOS 56  BILITOT 0.6  PROT 7.1  ALBUMIN 3.6    Radiological Exams on Admission: DG Chest Portable 1 View  Result Date: 06/30/2019 CLINICAL DATA:  Shortness of breath EXAM: PORTABLE CHEST 1 VIEW COMPARISON:  06/14/2019 FINDINGS: Lungs are  essentially clear. No focal consolidation. No pleural effusion or pneumothorax. Heart is normal in size. IMPRESSION: No evidence of acute cardiopulmonary disease. Electronically Signed   By: Julian Hy M.D.   On: 06/30/2019 14:10    EKG: Independently reviewed. Sinus rhythm, ST repolarization abnormalites v3 and v4, unchanged from prior EKG. QTc 438.  Assessment/Plan Active Problems:   Asthma exacerbation    Asthma Exacerbation with acute respiratory failure- dyspnea, O2 sats down to 86% on room air, currently on 2 L nasal cannula with sats > 92%.  Respiratory virus panel negative for influenza and COVID-19.  Portable chest x-ray negative for acute cardiopulmonary abnormality.  Former smoker quit 2011. Home medications include once monthly reslizumab.  Follows with Pulmonologist Dr. Annamaria Boots. - Cont Prednisone 60mg   daily ( Solumedrol allergy noted). 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  Hx of SVT requiring cardioversion. use of beta agonist bronchodil increases risk.  DVT prophylaxis: Lovenox Code Status: Full code Family Communication: None at bedside Disposition Plan: 1 - 2 days Consults called: None Admission status: obs, tele    Onnie Boer MD [facility]  06/30/2019, 5:41 PM

## 2019-06-30 NOTE — ED Notes (Signed)
Call to floor   Bethann Berkshire, RN will call back for report

## 2019-06-30 NOTE — Progress Notes (Signed)
Lopressor 5mg  given IV and Hycodan ordered for cough. Pt's HR is currently 83.  Pt states he is feeling well with no complications at this time. Will continue to monitor pt.

## 2019-06-30 NOTE — ED Notes (Signed)
Second call to resp  They state  That they "have a protocol" and cannot give tx until the covid test of their protocol is resulted   They are aware of neg POC, neg test of their protocol must be resulted   Dr Adriana Simas made aware as well as pt sats on 3L  He will assess

## 2019-06-30 NOTE — Progress Notes (Signed)
MEWS Guidelines - (patients age 44 and over) Pt is currently yellow 3 in MEW score. Pt's HR is 143 SVT. Pt states it's r/t coughing spell, and his HR increases at times. MD notified. Waiting for orders at this time. Red - At High Risk for Deterioration Yellow - At risk for Deterioration  1. Go to room and assess patient 2. Validate data. Is this patient's baseline? If data confirmed: 3. Is this an acute change? 4. Administer prn meds/treatments as ordered. 5. Note Sepsis score 6. Review goals of care 7. Radiation protection practitioner, RRT nurse and Provider. 8. Ask Provider to come to bedside.  9. Document patient condition/interventions/response. 10. Increase frequency of vital signs and focused assessments to at least q15 minutes x 4, then q30 minutes x2. - If stable, then q1h x3, then q4h x3 and then q8h or dept. routine. - If unstable, contact Provider & RRT nurse. Prepare for possible transfer. 11. Add entry in progress notes using the smart phrase ".MEWS". 1. Go to room and assess patient 2. Validate data. Is this patient's baseline? If data confirmed: 3. Is this an acute change? 4. Administer prn meds/treatments as ordered? 5. Note Sepsis score 6. Review goals of care 7. Radiation protection practitioner and Provider 8. Call RRT nurse as needed. 9. Document patient condition/interventions/response. 10. Increase frequency of vital signs and focused assessments to at least q2h x2. - If stable, then q4h x2 and then q8h or dept. routine. - If unstable, contact Provider & RRT nurse. Prepare for possible transfer. 11. Add entry in progress notes using the smart phrase ".MEWS".  Green - Likely stable Lavender - Comfort Care Only  1. Continue routine/ordered monitoring.  2. Review goals of care. 1. Continue routine/ordered monitoring. 2. Review goals of care.

## 2019-06-30 NOTE — ED Provider Notes (Signed)
[facility] EMERGENCY DEPARTMENT Provider Note   CSN: [id number] Arrival date & time: 06/30/19  1319     History Chief Complaint  Patient presents with  . Shortness of Breath    Drew Sandoval is a 44 y.o. male.  Patient complains of shortness of breath and wheezing.  Patient has a history of asthma.  He was recently admitted for his asthma  The history is provided by the patient. No language interpreter was used.  Shortness of Breath Severity:  Moderate Onset quality:  Sudden Timing:  Constant Progression:  Worsening Chronicity:  Recurrent Context: activity   Relieved by:  Nothing Worsened by:  Nothing Associated symptoms: wheezing   Associated symptoms: no abdominal pain, no chest pain, no cough, no headaches and no rash        Past Medical History:  Diagnosis Date  . Asthma   . Seasonal allergies     Patient Active Problem List   Diagnosis Date Noted  . Persistent asthma with acute exacerbation 04/17/2019  . Hypokalemia 03/10/2016  . Hypoxia 10/28/2014  . Acute conjunctivitis of right eye 10/07/2014  . Sustained SVT ([place]) 09/24/2014  . SVT (supraventricular tachycardia) ([place]) 09/24/2014  . Malnutrition of moderate degree ([place]) 09/24/2014  . Underweight 09/24/2014  . Seasonal and perennial allergic rhinitis 07/17/2014  . Acute respiratory failure with hypoxia ([place]) 07/23/2013  . 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 Eczema, allergic 07/22/2011  . Status asthmaticus 10/19/2010  . Sinusitis, maxillary, 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  . 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 Asthma, severe persistent 07/16/2010    Past Surgical History:  Procedure Laterality Date  . None         Family History  Problem Relation Age of Onset  . Emphysema Father   . Lung cancer Mother   . Arthritis Sister     Social History   Tobacco Use  . Smoking status: Former Smoker    Packs/day: 0.50    Years: 11.50    Pack years: 5.75    Types: Cigarettes    Quit date: 04/09/2010     Years since quitting: 9.2  . Smokeless tobacco: Former Systems developer  . Tobacco comment: started at age 66.  1/2 ppd.    Substance Use Topics  . Alcohol use: Yes    Comment: rarely  . Drug use: No    Home Medications Prior to Admission medications   Medication Sig Start Date End Date Taking? Authorizing Provider  albuterol (PROVENTIL) (2.5 MG/3ML) 0.083% nebulizer solution Take 3 mLs (2.5 mg total) by nebulization every 6 (six) hours as needed for wheezing or shortness of breath. 04/20/19  Yes Young, Tarri Fuller D, MD  albuterol (VENTOLIN HFA) 108 (90 Base) MCG/ACT inhaler Inhale 2 puffs into the lungs every 6 (six) hours as needed for wheezing or shortness of breath. 04/19/19  Yes Johnson, Clanford L, MD  EPINEPHrine 0.3 mg/0.3 mL IJ SOAJ injection Inject into thigh for severe allergic reaction Patient taking differently: Inject 0.3 mg into the muscle once. Inject into thigh for severe allergic reaction 04/29/15  Yes Young, Clinton D, MD  montelukast (SINGULAIR) 10 MG tablet TAKE 1 TABLET BY MOUTH EVERYDAY AT BEDTIME Patient taking differently: Take 10 mg by mouth at bedtime.  02/14/19  Yes Young, Tarri Fuller D, MD  predniSONE (DELTASONE) 10 MG tablet 4 X 2 DAYS, 3 X 2 DAYS, 2 X 2 DAYS, 1 X 2 DAYS 05/23/19   Baird Lyons D, MD  predniSONE (DELTASONE) 20 MG tablet Take 2 tablets (40 mg total) by  mouth daily. 06/14/19   Raeford Razor, MD  primidone (MYSOLINE) 50 MG tablet Take 1 tablet (50 mg total) by mouth 2 (two) times daily. 03/20/19   Tat, Octaviano Batty, DO  Reslizumab (CINQAIR IV) Inject 228.3 mg into the vein every 30 (thirty) days.    [provider]  TRELEGY ELLIPTA 100-62.5-25 MCG/INH AEPB INHALE 1 PUFF INTO THE LUNGS DAILY. RINSE MOUTH Patient taking differently: Inhale 1 puff into the lungs daily.  02/22/19   [name], MD    Allergies    Azithromycin, Alvesco [ciclesonide], Anoro ellipta [umeclidinium-vilanterol], Banana, Methylprednisolone, and Penicillins  Review of Systems     Review of Systems  Constitutional: Negative for appetite change and fatigue.  HENT: Negative for congestion, ear discharge and sinus pressure.   Eyes: Negative for discharge.  Respiratory: Positive for shortness of breath and wheezing. Negative for cough.   Cardiovascular: Negative for chest pain.  Gastrointestinal: Negative for abdominal pain and diarrhea.  Genitourinary: Negative for frequency and hematuria.  Musculoskeletal: Negative for back pain.  Skin: Negative for rash.  Neurological: Negative for seizures and headaches.  Psychiatric/Behavioral: Negative for hallucinations.    Physical Exam Updated Vital Signs BP 133/90   Pulse 89   Resp (!) 22   Ht 6\' 3"  (1.905 m)   Wt 75.8 kg   SpO2 (!) 88%   BMI 20.87 kg/m   Physical Exam Vitals and nursing note reviewed.  Constitutional:      Appearance: He is well-developed.  HENT:     Head: Normocephalic.     Nose: Nose normal.  Eyes:     General: No scleral icterus.    Conjunctiva/sclera: Conjunctivae normal.  Neck:     Thyroid: No thyromegaly.  Cardiovascular:     Rate and Rhythm: Normal rate and regular rhythm.     Heart sounds: No murmur. No friction rub. No gallop.   Pulmonary:     Breath sounds: No stridor. Wheezing present. No rales.     Comments: Tachypnea Chest:     Chest wall: No tenderness.  Abdominal:     General: There is no distension.     Tenderness: There is no abdominal tenderness. There is no rebound.  Musculoskeletal:        General: Normal range of motion.     Cervical back: Normal range of motion and neck supple.  Lymphadenopathy:     Cervical: No cervical adenopathy.  Skin:    Findings: No erythema or rash.  Neurological:     Mental Status: He is alert and oriented to person, place, and time.     Motor: No abnormal muscle tone.     Coordination: Coordination normal.  Psychiatric:        Behavior: Behavior normal.     ED Results / Procedures / Treatments   Labs (all labs ordered are  listed, but only abnormal results are displayed) Labs Reviewed  COMPREHENSIVE METABOLIC PANEL - Abnormal; Notable for the following components:      Result Value   Calcium 8.4 (*)    AST 14 (*)    All other components within normal limits  CBC WITH DIFFERENTIAL/PLATELET  POC SARS CORONAVIRUS 2 AG -  ED    EKG None  Radiology DG Chest Portable 1 View  Result Date: 06/30/2019 CLINICAL DATA:  Shortness of breath EXAM: PORTABLE CHEST 1 VIEW COMPARISON:  06/14/2019 FINDINGS: Lungs are essentially clear. No focal consolidation. No pleural effusion or pneumothorax. Heart is normal in size. IMPRESSION: No evidence  of acute cardiopulmonary disease. Electronically Signed   By: Charline Bills M.D.   On: 06/30/2019 14:10    Procedures Procedures (including critical care time)  Medications Ordered in ED Medications  ipratropium-albuterol (DUONEB) 0.5-2.5 (3) MG/3ML nebulizer solution 3 mL (3 mLs Nebulization Not Given 06/30/19 1437)  albuterol (PROVENTIL) (2.5 MG/3ML) 0.083% nebulizer solution 2.5 mg (2.5 mg Nebulization Not Given 06/30/19 1436)  predniSONE (DELTASONE) tablet 60 mg (60 mg Oral Given 06/30/19 1411)  magnesium sulfate IVPB 2 g 50 mL (2 g Intravenous New Bag/Given 06/30/19 1410)  albuterol (VENTOLIN HFA) 108 (90 Base) MCG/ACT inhaler 4 puff (4 puffs Inhalation Given 06/30/19 1410)    ED Course  I have reviewed the triage vital signs and the nursing notes.  Pertinent labs & imaging results that were available during my care of the patient were reviewed by me and considered in my medical decision making (see chart for details).    MDM Rules/Calculators/A&P                      Patient is improving with albuterol and steroids.  He will get a neb treatment now that his Covid is negative and he will be reevaluated.        Patient improved some with treatment emergency department but need to be admitted for asthma Final Clinical Impression(s) / ED Diagnoses Final diagnoses:  None     Rx / DC Orders ED Discharge Orders    None       Bethann Berkshire, MD 07/02/19 1552

## 2019-06-30 NOTE — Progress Notes (Signed)
RT called to treat patient. After reviewing the patients chart it was noted that the patient only had a POC test for COVID that was negative. We are required to have a negative PCR test before administering any nebulized medication. I called back to the ED to speak with the RN, she was not available so I spoke with the charge RN in the ED. I told her the situation and she said that he patient was in no distress. I documented against the treatments that are ordered via nebulization.

## 2019-06-30 NOTE — ED Notes (Signed)
Pt reports he feels a little better   sats have not improved on 3L/[place]  BP is better, pulse decreased, monitor with fewer PVCs

## 2019-07-01 DIAGNOSIS — I471 Supraventricular tachycardia: Secondary | ICD-10-CM | POA: Diagnosis not present

## 2019-07-01 DIAGNOSIS — D824 Hyperimmunoglobulin E [IgE] syndrome: Secondary | ICD-10-CM

## 2019-07-01 DIAGNOSIS — J45901 Unspecified asthma with (acute) exacerbation: Secondary | ICD-10-CM

## 2019-07-01 MED ORDER — PREDNISONE 10 MG PO TABS: ORAL_TABLET | ORAL | 0 refills | Status: DC

## 2019-07-01 MED ORDER — DILTIAZEM HCL 30 MG PO TABS: 30.0000 mg | ORAL_TABLET | Freq: Four times a day (QID) | ORAL | 0 refills | Status: DC | PRN

## 2019-07-01 NOTE — Progress Notes (Signed)
IV and tele removed. D/C instructions reviewed with patient, verbalized understanding. Transported to private vehicle via wheelchair. 

## 2019-07-01 NOTE — Discharge Summary (Signed)
Physician Discharge Summary  Drew Sandoval POE:[id number] DOB: 11/29/1975 DOA: 06/30/2019  PCP: Kirstie Peri, MD  Admit date: 06/30/2019 Discharge date: 07/01/2019  Admitted From: Home Disposition: Home  Recommendations for Outpatient Follow-up:  1. Follow up with PCP in 1-2 weeks 2. Please obtain BMP/CBC in one week 3. Patient will follow up with primary pulmonologist, Dr. Maple Hudson on 3/8 4. He will be referred to cardiology   Discharge Condition: Stable CODE STATUS: Full code Diet recommendation: Regular diet  Brief/Interim Summary: 44 year old male with a history of asthma, hyper IgE syndrome, SVT, presents to the emergency room shortness of breath.  He reports running out evidence of Trelegy inhaler for approximately 2 to 3 days prior to admission.  He began having shortness of breath, dry cough and wheezing.  Patient was noted to be mildly hypoxic on room air at 86%.  Supplemental oxygen was applied.  He received bronchodilators in the emergency room as well as given prednisone.  His overall respiratory status improved.  He is now breathing comfortably on room air and wheezing has resolved.  He has been started on a prednisone taper.  He reports having his inhalers including Trelegy at home now.  During his hospital course, he did have an episode of SVT which resolved after receiving IV beta-blockers.  He does report experiencing palpitations frequently at home.  These usually resolve spontaneously within 20 to 30 minutes.  He will be given a small dose of diltiazem to be used as needed for palpitations.  TSH was checked within the past year and noted to be normal.  He reports having the symptoms for several years now.  ER records indicate that in 2016, he received adenosine in the emergency room.  Patient be referred to outpatient cardiology for further evaluation.  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[facility])   SVT (supraventricular tachycardia) (HCC)   Asthma  exacerbation    Discharge Instructions  Discharge Instructions    Diet - low sodium heart healthy   Complete by: As directed    Increase activity slowly   Complete by: As directed      Allergies as of 07/01/2019      Reactions   Azithromycin Rash   Alvesco [ciclesonide]    Possible reaction- rash   Anoro Ellipta [umeclidinium-vilanterol]    Possible- rash   Banana Swelling   Throat swelling   Methylprednisolone    Penicillins Rash   Has patient had a PCN reaction causing immediate rash, facial/tongue/throat swelling, SOB or lightheadedness with hypotension: Yes Has patient had a PCN reaction causing severe rash involving mucus membranes or skin necrosis: No Has patient had a PCN reaction that required hospitalization Yes Has patient had a PCN reaction occurring within the last 10 years: No If all of the above answers are "NO", then may proceed with Cephalosporin use.      Medication List    TAKE these medications   albuterol 108 (90 Base) MCG/ACT inhaler Commonly known as: VENTOLIN HFA Inhale 2 puffs into the lungs every 6 (six) hours as needed for wheezing or shortness of breath.   albuterol (2.5 MG/3ML) 0.083% nebulizer solution Commonly known as: PROVENTIL Take 3 mLs (2.5 mg total) by nebulization every 6 (six) hours as needed for wheezing or shortness of breath.   CINQAIR IV Inject 228.3 mg into the vein every 30 (thirty) days.   diltiazem 30 MG tablet Commonly known as: Cardizem Take 1 tablet (30 mg total) by mouth every 6 (six) hours  as needed (palpitations).   EPINEPHrine 0.3 mg/0.3 mL Soaj injection Commonly known as: EPI-PEN Inject into thigh for severe allergic reaction What changed:   how much to take  how to take this  when to take this   montelukast 10 MG tablet Commonly known as: SINGULAIR TAKE 1 TABLET BY MOUTH EVERYDAY AT BEDTIME What changed:   how much to take  how to take this  when to take this  additional instructions    predniSONE 10 MG tablet Commonly known as: DELTASONE 4 X 2 DAYS, 3 X 2 DAYS, 2 X 2 DAYS, 1 X 2 DAYS What changed: Another medication with the same name was removed. Continue taking this medication, and follow the directions you see here.   primidone 50 MG tablet Commonly known as: MYSOLINE Take 1 tablet (50 mg total) by mouth 2 (two) times daily.   Trelegy Ellipta 100-62.5-25 MCG/INH Aepb Generic drug: Fluticasone-Umeclidin-Vilant INHALE 1 PUFF INTO THE LUNGS DAILY. RINSE MOUTH What changed: See the new instructions.      Follow-up Information    [name], MD Follow up on 07/16/2019.   Specialty: Pulmonary Disease Why: discuss alternative meds for albuterol due to fast heart rate Contact information: [address] [place] [address] [phone]          Allergies  Allergen Reactions  . Azithromycin Rash  . Alvesco [Ciclesonide]     Possible reaction- rash  . Anoro Ellipta [Umeclidinium-Vilanterol]     Possible- rash  . Banana Swelling    Throat swelling   . Methylprednisolone   . Penicillins Rash    Has patient had a PCN reaction causing immediate rash, facial/tongue/throat swelling, SOB or lightheadedness with hypotension: Yes Has patient had a PCN reaction causing severe rash involving mucus membranes or skin necrosis: No Has patient had a PCN reaction that required hospitalization Yes Has patient had a PCN reaction occurring within the last 10 years: No If all of the above answers are "NO", then may proceed with Cephalosporin use.      Consultations:     Procedures/Studies: DG Chest Portable 1 View  Result Date: 06/30/2019 CLINICAL DATA:  Shortness of breath EXAM: PORTABLE CHEST 1 VIEW COMPARISON:  06/14/2019 FINDINGS: Lungs are essentially clear. No focal consolidation. No pleural effusion or pneumothorax. Heart is normal in size. IMPRESSION: No evidence of acute cardiopulmonary disease. Electronically Signed   By: Charline Bills  M.D.   On: 06/30/2019 14:10   DG Chest Portable 1 View  Result Date: 06/14/2019 CLINICAL DATA:  Shortness of breath for 1 month and hypoxia EXAM: PORTABLE CHEST 1 VIEW COMPARISON:  04/17/2019 FINDINGS: Cardiac shadow is within normal limits. The lungs are well aerated bilaterally. No focal infiltrate or sizable effusion is seen. No bony abnormality is noted. IMPRESSION: No active disease. Electronically Signed   By: Alcide Clever M.D.   On: 06/14/2019 22:09      Subjective: Shortness of breath is improved.  No wheezing.  Discharge Exam: Vitals:   06/30/19 2258 07/01/19 0239 07/01/19 0527 07/01/19 0800  BP: 134/84  134/73   Pulse: 64  72   Resp: 20     Temp: 98.2 F (36.8 C)  98.3 F (36.8 C)   TempSrc: Oral  Oral   SpO2: 97% 97% 95% 96%  Weight:      Height:        General: Pt is alert, awake, not in acute distress Cardiovascular: RRR, S1/S2 +, no rubs, no gallops  Respiratory: CTA bilaterally, no wheezing, no rhonchi Abdominal: Soft, NT, ND, bowel sounds + Extremities: no edema, no cyanosis    The results of significant diagnostics from this hospitalization (including imaging, microbiology, ancillary and laboratory) are listed below for reference.     Microbiology: Recent Results (from the past 240 hour(s))  Respiratory Panel by RT PCR (Flu A&B, Covid) - Nasopharyngeal Swab     Status: None   Collection Time: 06/30/19  4:00 PM   Specimen: Nasopharyngeal Swab  Result Value Ref Range Status   SARS Coronavirus 2 by RT PCR NEGATIVE NEGATIVE Final    Comment: (NOTE) SARS-CoV-2 target nucleic acids are NOT DETECTED. The SARS-CoV-2 RNA is generally detectable in upper respiratoy specimens during the acute phase of infection. The lowest concentration of SARS-CoV-2 viral copies this assay can detect is 131 copies/mL. A negative result does not preclude SARS-Cov-2 infection and should not be used as the sole basis for treatment or other patient management decisions. A negative  result may occur with  improper specimen collection/handling, submission of specimen other than nasopharyngeal swab, presence of viral mutation(s) within the areas targeted by this assay, and inadequate number of viral copies (<131 copies/mL). A negative result must be combined with clinical observations, patient history, and epidemiological information. The expected result is Negative. Fact Sheet for Patients:  https://www.moore.com/ Fact Sheet for Healthcare Providers:  https://www.young.biz/ This test is not yet ap proved or cleared by the [place] FDA and  has been authorized for detection and/or diagnosis of SARS-CoV-2 by FDA under an Emergency Use Authorization (EUA). This EUA will remain  in effect (meaning this test can be used) for the duration of the COVID-19 declaration under Section 564(b)(1) of the Act, 21 U.S.C. section 360bbb-3(b)(1), unless the authorization is terminated or revoked sooner.    Influenza A by PCR NEGATIVE NEGATIVE Final   Influenza B by PCR NEGATIVE NEGATIVE Final    Comment: (NOTE) The Xpert Xpress SARS-CoV-2/FLU/RSV assay is intended as an aid in  the diagnosis of influenza from Nasopharyngeal swab specimens and  should not be used as a sole basis for treatment. Nasal washings and  aspirates are unacceptable for Xpert Xpress SARS-CoV-2/FLU/RSV  testing. Fact Sheet for Patients: https://www.moore.com/ Fact Sheet for Healthcare Providers: https://www.young.biz/ This test is not yet approved or cleared by the [place] FDA and  has been authorized for detection and/or diagnosis of SARS-CoV-2 by  FDA under an Emergency Use Authorization (EUA). This EUA will remain  in effect (meaning this test can be used) for the duration of the  Covid-19 declaration under Section 564(b)(1) of the Act, 21  U.S.C. section 360bbb-3(b)(1), unless the authorization is  terminated or  revoked. Performed at [facility], [address] [place], [place] [address]      Labs: BNP (last 3 results) No results for input(s): BNP in the last 8760 hours. Basic Metabolic Panel: Recent Labs  Lab 06/30/19 1348  NA 139  K 4.1  CL 110  CO2 23  GLUCOSE 89  BUN 11  CREATININE 0.87  CALCIUM 8.4*   Liver Function Tests: Recent Labs  Lab 06/30/19 1348  AST 14*  ALT 13  ALKPHOS 56  BILITOT 0.6  PROT 7.1  ALBUMIN 3.6   No results for input(s): LIPASE, AMYLASE in the last 168 hours. No results for input(s): AMMONIA in the last 168 hours. CBC: Recent Labs  Lab 06/30/19 1348  WBC 5.5  NEUTROABS 3.5  HGB 15.0  HCT 45.8  MCV 92.3  PLT 222   Cardiac Enzymes: No results for input(s): CKTOTAL, CKMB, CKMBINDEX, TROPONINI in the last 168 hours. BNP: Invalid input(s): POCBNP CBG: No results for input(s): GLUCAP in the last 168 hours. D-Dimer No results for input(s): DDIMER in the last 72 hours. Hgb A1c No results for input(s): HGBA1C in the last 72 hours. Lipid Profile No results for input(s): CHOL, HDL, LDLCALC, TRIG, CHOLHDL, LDLDIRECT in the last 72 hours. Thyroid function studies No results for input(s): TSH, T4TOTAL, T3FREE, THYROIDAB in the last 72 hours.  Invalid input(s): FREET3 Anemia work up No results for input(s): VITAMINB12, FOLATE, FERRITIN, TIBC, IRON, RETICCTPCT in the last 72 hours. Urinalysis    Component Value Date/Time   COLORURINE AMBER (A) 12/15/2017 1445   APPEARANCEUR CLOUDY (A) 12/15/2017 1445   LABSPEC 1.020 12/15/2017 1445   PHURINE 6.0 12/15/2017 1445   GLUCOSEU NEGATIVE 12/15/2017 1445   HGBUR LARGE (A) 12/15/2017 1445   BILIRUBINUR NEGATIVE 12/15/2017 1445   KETONESUR NEGATIVE 12/15/2017 1445   PROTEINUR 30 (A) 12/15/2017 1445   UROBILINOGEN 0.2 06/24/2011 0858   NITRITE NEGATIVE 12/15/2017 1445   LEUKOCYTESUR NEGATIVE 12/15/2017 1445   Sepsis Labs Invalid input(s): PROCALCITONIN,  WBC,  LACTICIDVEN Microbiology Recent  Results (from the past 240 hour(s))  Respiratory Panel by RT PCR (Flu A&B, Covid) - Nasopharyngeal Swab     Status: None   Collection Time: 06/30/19  4:00 PM   Specimen: Nasopharyngeal Swab  Result Value Ref Range Status   SARS Coronavirus 2 by RT PCR NEGATIVE NEGATIVE Final    Comment: (NOTE) SARS-CoV-2 target nucleic acids are NOT DETECTED. The SARS-CoV-2 RNA is generally detectable in upper respiratoy specimens during the acute phase of infection. The lowest concentration of SARS-CoV-2 viral copies this assay can detect is 131 copies/mL. A negative result does not preclude SARS-Cov-2 infection and should not be used as the sole basis for treatment or other patient management decisions. A negative result may occur with  improper specimen collection/handling, submission of specimen other than nasopharyngeal swab, presence of viral mutation(s) within the areas targeted by this assay, and inadequate number of viral copies (<131 copies/mL). A negative result must be combined with clinical observations, patient history, and epidemiological information. The expected result is Negative. Fact Sheet for Patients:  PinkCheek.be Fact Sheet for Healthcare Providers:  GravelBags.it This test is not yet ap proved or cleared by the [place] FDA and  has been authorized for detection and/or diagnosis of SARS-CoV-2 by FDA under an Emergency Use Authorization (EUA). This EUA will remain  in effect (meaning this test can be used) for the duration of the COVID-19 declaration under Section 564(b)(1) of the Act, 21 U.S.C. section 360bbb-3(b)(1), unless the authorization is terminated or revoked sooner.    Influenza A by PCR NEGATIVE NEGATIVE Final   Influenza B by PCR NEGATIVE NEGATIVE Final    Comment: (NOTE) The Xpert Xpress SARS-CoV-2/FLU/RSV assay is intended as an aid in  the diagnosis of influenza from Nasopharyngeal swab specimens  and  should not be used as a sole basis for treatment. Nasal washings and  aspirates are unacceptable for Xpert Xpress SARS-CoV-2/FLU/RSV  testing. Fact Sheet for Patients: PinkCheek.be Fact Sheet for Healthcare Providers: GravelBags.it This test is not yet approved or cleared by the [place] FDA and  has been authorized for detection and/or diagnosis of SARS-CoV-2 by  FDA under an Emergency Use Authorization (EUA). This EUA will remain  in effect (meaning this test can be used) for the duration of the  Covid-19 declaration under Section 564(b)(1) of the Act, 21  U.S.C. section 360bbb-3(b)(1), unless the authorization is  terminated or revoked. Performed at [facility], [address], [place], [place] [address]      Time coordinating discharge:  SIGNED:   Erick Blinks, MD  [facility] 07/01/2019, 8:00 PM   If 7PM-7AM, please contact night-coverage [email or web address]

## 2019-07-16 ENCOUNTER — Other Ambulatory Visit: Payer: Self-pay

## 2019-07-16 ENCOUNTER — Ambulatory Visit: Payer: BC Managed Care – PPO | Admitting: Internal Medicine

## 2019-07-16 ENCOUNTER — Encounter: Payer: Self-pay | Admitting: Internal Medicine

## 2019-07-16 VITALS — BP 118/82 | HR 77 | Temp 98.2°F | Ht 75.0 in | Wt 176.4 lb

## 2019-07-16 DIAGNOSIS — I471 Supraventricular tachycardia: Secondary | ICD-10-CM | POA: Diagnosis not present

## 2019-07-16 DIAGNOSIS — J4551 Severe persistent asthma with (acute) exacerbation: Secondary | ICD-10-CM | POA: Diagnosis not present

## 2019-07-16 MED ORDER — LEVALBUTEROL HCL 0.31 MG/3ML IN NEBU: 1.0000 | INHALATION_SOLUTION | RESPIRATORY_TRACT | 12 refills | Status: DC | PRN

## 2019-07-16 MED ORDER — LEVALBUTEROL TARTRATE 45 MCG/ACT IN AERO: INHALATION_SPRAY | RESPIRATORY_TRACT | 12 refills | Status: DC

## 2019-07-16 MED ORDER — BREZTRI AEROSPHERE 160-9-4.8 MCG/ACT IN AERO: 2.0000 | INHALATION_SPRAY | Freq: Two times a day (BID) | RESPIRATORY_TRACT | 5 refills | Status: DC

## 2019-07-16 NOTE — Assessment & Plan Note (Signed)
We are changing albuterol hfa and neb solution to levalbuterol. Appointment with cardiology in [place].

## 2019-07-16 NOTE — Progress Notes (Signed)
Patient ID: Drew Sandoval, male    DOB: [birth year]    MRN: [id number]  HPI M  former smoker followed for severe asthma/ COPD, chronic sinusitis, hyper-eosinophilia and hyper IgE, complicated by history SVT/cardioversion FENO 03/18/16- 100  High, indicates allergic asthma Office Spirometry 03/18/2016-severe obstructive airways disease FVC 2.62/51%, FEV1 1.30/31%, FEV1/FVC 0.50, FEF 25-75 0.61/14% Office Spirometry 01/25/17-severe obstructive airways disease. FVC 3.52/68%, FEV1 1.80/43%, ratio or 0.51, FEF 25-75% 0.76/18% IgE was too high for Xolair. He could not afford Nucala or allergy vaccine. Cinqair infusion@ [facility]- ordered 07/21/17 ----------------------------------------------------   05/23/19- Virtual Visit via Telephone Note History of Present Illness: 44 year old male former smoker followed for severe asthma/ COPD/ Cinqair, chronic sinusitis, persistent eosinophilia, hyper IgE-too high for Xolair, history SVT/cardioversion, Kidney stone [facility] 12/8-12/10 Asthma and stated prednisone taper on discharge. Continues Cinqair -----Pt c/o chest congestion and wheezing. Pt states he was around a child that had a cold and thinks he got it Reports cough, wheeze starting 4 days ago. No fever, anosmia or purulent mucus. Used neb last night, rescue x 3 yesterday.  Observations/Objective: CXR 04/17/2019 1V- Heart size and pulmonary vascularity are normal. Lungs are clear. No effusions. No bone abnormality. IMPRESSION: No active disease.  Assessment and Plan: Asthma exacerbation- Plan prednisone taper, call if no better Hx SVT- no recurrence so far asthma exacerbation,  Follow Up Instructions: Keep appt    07/16/19- 44 year old male former smoker followed for severe asthma/ COPD/ Cinqair, chronic sinusitis, persistent eosinophilia, hyper IgE-too high for Xolair, history SVT/cardioversion, Kidney stone [facility] 2/20-2/21 for asthma exacerbation. Had episode SVT. Needs outpt cardiology referral.  Had run out of Trelegy 3 days. Had ED visit 2/4, Rx'd steroids. ------Severe persistent asthma with acute exacerbation. Patient stated breaking is ok today Trelegy 100, Cinqair IV, Singulair, Ventolin hfa, neb albuterol Used neb 2x last week, rescue yesterday. Has finished prednisone. Trelegy was too expensive, so out of it for a while. Has filled it now. We can give print script for Breztri to  price compare. He apparently never had cardiology f/u after SVT years ago, and had not told us about recurrent palpitation. CXR 06/30/19- 1V- IMPRESSION: No evidence of acute cardiopulmonary disease.   ROS-see HPI  "+" = positive Constitutional:    weight loss, night sweats, fevers, chills, fatigue, lassitude. HEENT:    headaches, difficulty swallowing, tooth/dental problems, sore throat,       sneezing, itching, ear ache, +nasal congestion, +post nasal drip, snoring CV:    chest pain, orthopnea, PND, swelling in lower extremities, anasarca,                                                      dizziness, palpitations Resp:   +shortness of breath with exertion or at rest.                productive cough,  non-productive cough, coughing up of blood.              change in color of mucus.  wheezing.   Skin:    rash or lesions. GI:  No-   heartburn, indigestion, abdominal pain, nausea, vomiting,  GU: d. MS:   joint pain, stiffness, decreased range of motion, back pain. Neuro-     nothing unusual Psych:  change in mood or affect.  depression or anxiety.   memory  loss.  Objective:   Physical Exam General- Alert, Oriented, Affect-reserved/ laconic, Distress-none acute;  Tall, thin Skin- +tatoos Lymphadenopathy- none Head- atraumatic            Eyes- Gross vision intact, PERRLA, conjunctivae clear secretions            Ears- Hearing, canals normal            Nose- Clear, no-Septal dev, mucus, polyps, erosion, perforation             Throat- Mallampati II , mucosa clear , drainage- none, tonsils-  atrophic Neck- flexible , trachea midline, no stridor , thyroid nl, carotid no bruit Chest - symmetrical excursion , unlabored           Heart/CV- RRR  , no murmur , no gallop  , no rub, nl s1 s2                           - JVD- none , edema- none, stasis changes- none, varices- none           Lung-   Diminished/ clear, unlabored,  Cough-none, dullness-none, rub- none,  Wheeze-none Abd- scaphoid  Br/ Gen/ Rectal- Not done, not indicated Extrem- cyanosis- none, clubbing, none, atrophy- none, strength- nl Neuro- +slight tremor

## 2019-07-16 NOTE — Assessment & Plan Note (Signed)
Severe persistent asthma. Had run out of Trelegy, but not called, when he couldn't afford refill. He has refilled it now. Plan- print script Markus Daft to price compare.

## 2019-07-16 NOTE — Patient Instructions (Signed)
I have printed scripts to replace your albuterol rescue inhaler and nebulizer solution with levalbuterol (Xopenex), which has less tendency to stimulate your heart  I have printed a script for [employer]. This is a daily maintenance medicine  Like Trelegy.       You can take this script to the drug sore and see if it would be cheaper for you than Trelegy.   Don't use them both- that would be duplication- just one or the other.   Order- referral to Cardiology in [place]      Dx recurrent SVT  Keep your Cinqair appointment as planned  Please let us know if you can't afford medicines, so we can see what we can do to help.

## 2019-07-17 ENCOUNTER — Encounter (HOSPITAL_COMMUNITY)
Admission: RE | Admit: 2019-07-17 | Discharge: 2019-07-17 | Disposition: A | Discharge status: Home / Self Care | Payer: BC Managed Care – PPO | Source: Ambulatory Visit | Attending: Internal Medicine | Admitting: Internal Medicine

## 2019-07-17 ENCOUNTER — Encounter (HOSPITAL_COMMUNITY): Payer: Self-pay

## 2019-07-17 DIAGNOSIS — J4550 Severe persistent asthma, uncomplicated: Secondary | ICD-10-CM | POA: Insufficient documentation

## 2019-07-17 MED ORDER — SODIUM CHLORIDE 0.9 % IV SOLN
228.3000 mg | Freq: Once | INTRAVENOUS | Status: AC
  Administered 2019-07-17: 228.3 mg via INTRAVENOUS
  Filled 2019-07-17: qty 22.83

## 2019-07-17 MED ORDER — SODIUM CHLORIDE 0.9 % IV SOLN: Freq: Once | INTRAVENOUS | Status: AC

## 2019-07-18 NOTE — Progress Notes (Deleted)
Drew Sandoval was seen today in follow up for moderate essential tremor, exacerbated by medications for his lungs.  Patient was started on primidone last visit and worked to 50 mg twice daily my previous records were reviewed prior to todays visit. Pt denies falls.  Pt denies lightheadedness, near syncope.  No hallucinations.  Mood has been good.  Medical records have been reviewed since our last visit.  Patient was hospitalized for day in February after running out of his asthma medication.  While in the hospital, he did have an episode of SVT.  Was given diltiazem for as needed use at home.  Was referred to outpatient cardiology for further evaluation.  Current prescribed movement disorder medications: ***Primidone, 50 mg twice per day   PREVIOUS MEDICATIONS: {Parkinson's RX:18200}  ALLERGIES:   Allergies  Allergen Reactions  . Azithromycin Rash  . Alvesco [Ciclesonide]     Possible reaction- rash  . Anoro Ellipta [Umeclidinium-Vilanterol]     Possible- rash  . Banana Swelling    Throat swelling   . Methylprednisolone   . Penicillins Rash    Has patient had a PCN reaction causing immediate rash, facial/tongue/throat swelling, SOB or lightheadedness with hypotension: Yes Has patient had a PCN reaction causing severe rash involving mucus membranes or skin necrosis: No Has patient had a PCN reaction that required hospitalization Yes Has patient had a PCN reaction occurring within the last 10 years: No If all of the above answers are "NO", then may proceed with Cephalosporin use.      CURRENT MEDICATIONS:  Outpatient Encounter Medications as of 07/23/2019  Medication Sig  . Budeson-Glycopyrrol-Formoterol (BREZTRI AEROSPHERE) 160-9-4.8 MCG/ACT AERO Inhale 2 puffs into the lungs 2 (two) times daily. Rinse mouth  . diltiazem (CARDIZEM) 30 MG tablet Take 1 tablet (30 mg total) by mouth every 6 (six) hours as needed (palpitations).  . EPINEPHrine 0.3 mg/0.3 mL IJ SOAJ injection  Inject into thigh for severe allergic reaction (Patient taking differently: Inject 0.3 mg into the muscle once. Inject into thigh for severe allergic reaction)  . levalbuterol (XOPENEX HFA) 45 MCG/ACT inhaler Inhale 2 puffs every 4-6 hours if needed- rescue  . levalbuterol (XOPENEX) 0.31 MG/3ML nebulizer solution Take 3 mLs (0.31 mg total) by nebulization every 4 (four) hours as needed for wheezing.  . montelukast (SINGULAIR) 10 MG tablet TAKE 1 TABLET BY MOUTH EVERYDAY AT BEDTIME (Patient taking differently: Take 10 mg by mouth at bedtime. )  . primidone (MYSOLINE) 50 MG tablet Take 1 tablet (50 mg total) by mouth 2 (two) times daily.  Marland Kitchen Reslizumab (CINQAIR IV) Inject 228.3 mg into the vein every 30 (thirty) days.  . TRELEGY ELLIPTA 100-62.5-25 MCG/INH AEPB INHALE 1 PUFF INTO THE LUNGS DAILY. RINSE MOUTH (Patient taking differently: Inhale 1 puff into the lungs daily. )   No facility-administered encounter medications on file as of 07/23/2019.    PHYSICAL EXAMINATION:    VITALS:  There were no vitals filed for this visit.  GEN:  The patient appears stated age and is in NAD. HEENT:  Normocephalic, atraumatic.  The mucous membranes are moist. The superficial temporal arteries are without ropiness or tenderness. CV:  RRR Lungs:  CTAB Neck/HEME:  There are no carotid bruits bilaterally.  Neurological examination:  Orientation: The patient is alert and oriented x3. Cranial nerves: There is good facial symmetry. The speech is fluent and clear. Soft palate rises symmetrically and there is no tongue deviation. Hearing is intact to conversational tone. Sensation: Sensation is intact  to light touch throughout Motor: Strength is at least antigravity x4.  Movement examination: Tone: There is normal tone in the UE/LE Abnormal movements: *** Coordination:  There is *** decremation with RAM's, *** Gait and Station: The patient has *** difficulty arising out of a deep-seated chair without the use of  the hands. The patient's stride length is good   ASSESSMENT/PLAN:  1.  Essential Tremor  ***Continue primidone, 50 mg twice per day  2.  Asthma  -Contributes to his tremor  3.  Recent SVT  -Awaiting cardiology opinion  Total time spent on today's visit was ***30 minutes, including both face-to-face time and nonface-to-face time.  Time included that spent on review of records (prior notes available to me/labs/imaging if pertinent), discussing treatment and goals, answering patient's questions and coordinating care.  Cc:  Monico Blitz, MD

## 2019-07-23 ENCOUNTER — Ambulatory Visit: Payer: BC Managed Care – PPO | Admitting: Neurology

## 2019-07-28 ENCOUNTER — Encounter (HOSPITAL_COMMUNITY): Payer: Self-pay

## 2019-07-28 ENCOUNTER — Emergency Department (HOSPITAL_COMMUNITY): Payer: BC Managed Care – PPO

## 2019-07-28 ENCOUNTER — Emergency Department (HOSPITAL_COMMUNITY)
Admission: EM | Admit: 2019-07-28 | Discharge: 2019-07-29 | Disposition: A | Discharge status: Home / Self Care | Payer: BC Managed Care – PPO | Attending: Emergency Medicine | Admitting: Emergency Medicine

## 2019-07-28 ENCOUNTER — Other Ambulatory Visit: Payer: Self-pay

## 2019-07-28 DIAGNOSIS — Z20822 Contact with and (suspected) exposure to covid-19: Secondary | ICD-10-CM | POA: Diagnosis not present

## 2019-07-28 DIAGNOSIS — Z87891 Personal history of nicotine dependence: Secondary | ICD-10-CM | POA: Insufficient documentation

## 2019-07-28 DIAGNOSIS — Z79899 Other long term (current) drug therapy: Secondary | ICD-10-CM | POA: Diagnosis not present

## 2019-07-28 DIAGNOSIS — R079 Chest pain, unspecified: Secondary | ICD-10-CM | POA: Diagnosis not present

## 2019-07-28 DIAGNOSIS — J4541 Moderate persistent asthma with (acute) exacerbation: Secondary | ICD-10-CM | POA: Diagnosis not present

## 2019-07-28 DIAGNOSIS — R0602 Shortness of breath: Secondary | ICD-10-CM | POA: Diagnosis not present

## 2019-07-28 MED ORDER — ALBUTEROL SULFATE HFA 108 (90 BASE) MCG/ACT IN AERS
2.0000 | INHALATION_SPRAY | RESPIRATORY_TRACT | Status: DC | PRN
  Administered 2019-07-29: 2 via RESPIRATORY_TRACT
  Filled 2019-07-28: qty 6.7

## 2019-07-28 MED ORDER — MAGNESIUM SULFATE 2 GM/50ML IV SOLN
2.0000 g | Freq: Once | INTRAVENOUS | Status: AC
  Administered 2019-07-29: 2 g via INTRAVENOUS
  Filled 2019-07-28: qty 50

## 2019-07-28 MED ORDER — DEXAMETHASONE SODIUM PHOSPHATE 10 MG/ML IJ SOLN
10.0000 mg | Freq: Once | INTRAMUSCULAR | Status: AC
  Administered 2019-07-29: 10 mg via INTRAVENOUS
  Filled 2019-07-28: qty 1

## 2019-07-28 NOTE — ED Triage Notes (Signed)
Pt states his asthma has been acting up  Since Thursday. Home meds haven't helped

## 2019-07-28 NOTE — ED Provider Notes (Signed)
[facility] EMERGENCY DEPARTMENT Provider Note   CSN: [id number] Arrival date & time: 07/28/19  2233     History Chief Complaint  Patient presents with  . Shortness of Breath    Drew Sandoval is a 44 y.o. male.  Patient presents to the emergency department for evaluation of shortness of breath and wheezing.  Patient reports that symptoms have been present for a couple of days, significantly worse today.  Patient reports tightness in his chest not relieved by his albuterol at home.  He has not had a fever.  He denies chest pain.  There is no associated cough.        Past Medical History:  Diagnosis Date  . Asthma   . Seasonal allergies     Patient Active Problem List   Diagnosis Date Noted  . Asthma exacerbation 06/30/2019  . Persistent asthma with acute exacerbation 04/17/2019  . Hypokalemia 03/10/2016  . Hypoxia 10/28/2014  . Acute conjunctivitis of right eye 10/07/2014  . Sustained SVT (HCC) 09/24/2014  . SVT (supraventricular tachycardia) (HCC) 09/24/2014  . Malnutrition of moderate degree (HCC) 09/24/2014  . Underweight 09/24/2014  . Seasonal and perennial allergic rhinitis 07/17/2014  . Acute respiratory failure with hypoxia (HCC) 07/23/2013  . 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 Eczema, allergic 07/22/2011  . Status asthmaticus 10/19/2010  . Sinusitis, maxillary, 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  . 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 Asthma, severe persistent 07/16/2010    Past Surgical History:  Procedure Laterality Date  . None         Family History  Problem Relation Age of Onset  . Emphysema Father   . Lung cancer Mother   . Arthritis Sister     Social History   Tobacco Use  . Smoking status: Former Smoker    Packs/day: 0.50    Years: 11.50    Pack years: 5.75    Types: Cigarettes    Quit date: 04/09/2010    Years since quitting: 9.3  . Smokeless tobacco: Former Neurosurgeon  . Tobacco comment: started at age 49.  1/2 ppd.    Substance Use Topics    . Alcohol use: Yes    Comment: rarely  . Drug use: No    Home Medications Prior to Admission medications   Medication Sig Start Date End Date Taking? Authorizing Provider  Budeson-Glycopyrrol-Formoterol (BREZTRI AEROSPHERE) 160-9-4.8 MCG/ACT AERO Inhale 2 puffs into the lungs 2 (two) times daily. Rinse mouth 07/16/19   Jetty Duhamel D, MD  diltiazem (CARDIZEM) 30 MG tablet Take 1 tablet (30 mg total) by mouth every 6 (six) hours as needed (palpitations). 07/01/19 06/30/20  Erick Blinks, MD  EPINEPHrine 0.3 mg/0.3 mL IJ SOAJ injection Inject into thigh for severe allergic reaction Patient taking differently: Inject 0.3 mg into the muscle once. Inject into thigh for severe allergic reaction 04/29/15   Jetty Duhamel D, MD  levalbuterol [facility] HFA) 45 MCG/ACT inhaler Inhale 2 puffs every 4-6 hours if needed- rescue 07/16/19   [name], MD  levalbuterol (XOPENEX) 0.31 MG/3ML nebulizer solution Take 3 mLs (0.31 mg total) by nebulization every 4 (four) hours as needed for wheezing. 07/16/19   Young, Joni Fears D, MD  montelukast (SINGULAIR) 10 MG tablet TAKE 1 TABLET BY MOUTH EVERYDAY AT BEDTIME Patient taking differently: Take 10 mg by mouth at bedtime.  02/14/19   [name], MD  predniSONE (DELTASONE) 20 MG tablet Take 2 tablets (40 mg total) by mouth daily with breakfast. 07/29/19   Leonela Kivi, Canary Brim, MD  primidone (MYSOLINE) 50 MG tablet Take 1 tablet (50 mg total) by mouth 2 (two) times daily. 03/20/19   Tat, Octaviano Batty, DO  Reslizumab (CINQAIR IV) Inject 228.3 mg into the vein every 30 (thirty) days.    [provider]  TRELEGY ELLIPTA 100-62.5-25 MCG/INH AEPB INHALE 1 PUFF INTO THE LUNGS DAILY. RINSE MOUTH Patient taking differently: Inhale 1 puff into the lungs daily.  02/22/19   [name], MD    Allergies    Azithromycin, Alvesco [ciclesonide], Anoro ellipta [umeclidinium-vilanterol], Banana, Methylprednisolone, and Penicillins  Review of Systems   Review  of Systems  Respiratory: Positive for shortness of breath and wheezing.   All other systems reviewed and are negative.   Physical Exam Updated Vital Signs BP (!) 138/104   Pulse 79   Temp 97.9 F (36.6 C)   Resp (!) 23   Ht 6\' 1"  (1.854 m)   Wt 80.1 kg   SpO2 (!) 88%   BMI 23.30 kg/m   Physical Exam Vitals and nursing note reviewed.  Constitutional:      General: He is not in acute distress.    Appearance: Normal appearance. He is well-developed.  HENT:     Head: Normocephalic and atraumatic.     Right Ear: Hearing normal.     Left Ear: Hearing normal.     Nose: Nose normal.  Eyes:     Conjunctiva/sclera: Conjunctivae normal.     Pupils: Pupils are equal, round, and reactive to light.  Cardiovascular:     Rate and Rhythm: Regular rhythm.     Heart sounds: S1 normal and S2 normal. No murmur. No friction rub. No gallop.   Pulmonary:     Effort: Pulmonary effort is normal. No respiratory distress.     Breath sounds: Decreased breath sounds and wheezing present.  Chest:     Chest wall: No tenderness.  Abdominal:     General: Bowel sounds are normal.     Palpations: Abdomen is soft.     Tenderness: There is no abdominal tenderness. There is no guarding or rebound. Negative signs include Murphy's sign and McBurney's sign.     Hernia: No hernia is present.  Musculoskeletal:        General: Normal range of motion.     Cervical back: Normal range of motion and neck supple.  Skin:    General: Skin is warm and dry.     Findings: No rash.  Neurological:     Mental Status: He is alert and oriented to person, place, and time.     GCS: GCS eye subscore is 4. GCS verbal subscore is 5. GCS motor subscore is 6.     Cranial Nerves: No cranial nerve deficit.     Sensory: No sensory deficit.     Coordination: Coordination normal.  Psychiatric:        Speech: Speech normal.        Behavior: Behavior normal.        Thought Content: Thought content normal.     ED Results /  Procedures / Treatments   Labs (all labs ordered are listed, but only abnormal results are displayed) Labs Reviewed  BASIC METABOLIC PANEL - Abnormal; Notable for the following components:      Result Value   Calcium 8.6 (*)    All other components within normal limits  RESPIRATORY PANEL BY RT PCR (FLU A&B, COVID)  CBC WITH DIFFERENTIAL/PLATELET    EKG EKG Interpretation  Date/Time:  Saturday July 28 2019 23:04:17 EDT Ventricular Rate:  62 PR Interval:    QRS Duration: 90 QT Interval:  424 QTC Calculation: 431 R Axis:   86 Text Interpretation: Sinus rhythm ST elev, probable normal early repol pattern No significant change since last tracing Confirmed by Orpah Greek [id number]) on 07/28/2019 11:31:34 PM   Radiology DG Chest Port 1 View  Result Date: 07/28/2019 CLINICAL DATA:  Shortness of breath and chest pain x3 days. EXAM: PORTABLE CHEST 1 VIEW COMPARISON:  June 30, 2019 FINDINGS: Mild, stable increased lung markings are seen without evidence of acute infiltrate, pleural effusion or pneumothorax. The heart size and mediastinal contours are within normal limits. The visualized skeletal structures are unremarkable. IMPRESSION: No active disease. Electronically Signed   By: Virgina Norfolk M.D.   On: 07/28/2019 23:35    Procedures Procedures (including critical care time)  Medications Ordered in ED Medications  albuterol (VENTOLIN HFA) 108 (90 Base) MCG/ACT inhaler 2 puff (2 puffs Inhalation Given 07/29/19 0142)  magnesium sulfate IVPB 2 g 50 mL (0 g Intravenous Stopped 07/29/19 0127)  dexamethasone (DECADRON) injection 10 mg (10 mg Intravenous Given 07/29/19 0000)  albuterol (PROVENTIL,VENTOLIN) solution continuous neb (10 mg/hr Nebulization Given 07/29/19 0128)  ipratropium (ATROVENT) 0.02 % nebulizer solution (0.5 mg  Given 07/29/19 0129)    ED Course  I have reviewed the triage vital signs and the nursing notes.  Pertinent labs & imaging results that were  available during my care of the patient were reviewed by me and considered in my medical decision making (see chart for details).    MDM Rules/Calculators/A&P                      Patient presents to the emergency department for evaluation of difficulty breathing.  Patient reports a history of asthma and has had increased wheezing.  He has used his home nebulizer and rescue inhaler but it has not helped much.  At arrival he was exhibiting borderline hypoxia with room air oxygen saturation of 89%.  He did have significant wheezing and decreased air movement.  This has resolved with aggressive treatment.  Upon recheck after all therapy he reports that he is breathing much better.  Lung exam reveals good air movement without wheezing.  I asked patient if he felt that he would require hospitalization and he states that he would like to go home.  As he is looking much better I believe this is reasonable.  He was counseled about returning for any worsening symptoms.  He has all of his bronchodilator therapy, will prescribe prednisone.  Final Clinical Impression(s) / ED Diagnoses Final diagnoses:  Moderate persistent asthma with acute exacerbation    Rx / DC Orders ED Discharge Orders         Ordered    predniSONE (DELTASONE) 20 MG tablet  Daily with breakfast     07/29/19 0251           Orpah Greek, MD 07/29/19 [phone]

## 2019-07-28 NOTE — ED Triage Notes (Signed)
89% on  RA.  Placed on 2l [place].

## 2019-07-29 LAB — BASIC METABOLIC PANEL
Anion gap: 7 (ref 5–15)
BUN: 11 mg/dL (ref 6–20)
CO2: 23 mmol/L (ref 22–32)
Calcium: 8.6 mg/dL — ABNORMAL LOW (ref 8.9–10.3)
Chloride: 110 mmol/L (ref 98–111)
Creatinine, Ser: 0.73 mg/dL (ref 0.61–1.24)
GFR calc Af Amer: >60 mL/min (ref >60)
GFR calc non Af Amer: >60 mL/min (ref >60)
Glucose, Bld: 91 mg/dL (ref 70–99)
Potassium: 3.7 mmol/L (ref 3.5–5.1)
Sodium: 140 mmol/L (ref 135–145)

## 2019-07-29 LAB — RESPIRATORY PANEL BY RT PCR (FLU A&B, COVID)
Influenza A by PCR: NEGATIVE
Influenza B by PCR: NEGATIVE
SARS Coronavirus 2 by RT PCR: NEGATIVE

## 2019-07-29 LAB — CBC WITH DIFFERENTIAL/PLATELET
Abs Immature Granulocytes: 0.02 10*3/uL (ref 0.00–0.07)
Basophils Absolute: 0 10*3/uL (ref 0.0–0.1)
Basophils Relative: 1 %
Eosinophils Absolute: 0.1 10*3/uL (ref 0.0–0.5)
Eosinophils Relative: 3 %
HCT: 47.2 % (ref 39.0–52.0)
Hemoglobin: 15.1 g/dL (ref 13.0–17.0)
Immature Granulocytes: 0 %
Lymphocytes Relative: 29 %
Lymphs Abs: 1.3 10*3/uL (ref 0.7–4.0)
MCH: 29.9 pg (ref 26.0–34.0)
MCHC: 32 g/dL (ref 30.0–36.0)
MCV: 93.5 fL (ref 80.0–100.0)
Monocytes Absolute: 0.5 10*3/uL (ref 0.1–1.0)
Monocytes Relative: 12 %
Neutro Abs: 2.5 10*3/uL (ref 1.7–7.7)
Neutrophils Relative %: 55 %
Platelets: 237 10*3/uL (ref 150–400)
RBC: 5.05 MIL/uL (ref 4.22–5.81)
RDW: 14.1 % (ref 11.5–15.5)
WBC: 4.5 10*3/uL (ref 4.0–10.5)
nRBC: 0 % (ref 0.0–0.2)

## 2019-07-29 MED ORDER — PREDNISONE 20 MG PO TABS: 40.0000 mg | ORAL_TABLET | Freq: Every day | ORAL | 0 refills | Status: DC

## 2019-07-29 MED ORDER — ALBUTEROL (5 MG/ML) CONTINUOUS INHALATION SOLN
10.0000 mg/h | INHALATION_SOLUTION | Freq: Once | RESPIRATORY_TRACT | Status: AC
  Administered 2019-07-29: 10 mg/h via RESPIRATORY_TRACT
  Filled 2019-07-29: qty 20

## 2019-07-29 MED ORDER — IPRATROPIUM BROMIDE 0.02 % IN SOLN
RESPIRATORY_TRACT | Status: AC
  Administered 2019-07-29: 0.5 mg
  Filled 2019-07-29: qty 2.5

## 2019-07-31 ENCOUNTER — Telehealth: Payer: Self-pay | Admitting: Internal Medicine

## 2019-07-31 ENCOUNTER — Encounter: Payer: Self-pay | Admitting: Internal Medicine

## 2019-07-31 NOTE — Telephone Encounter (Signed)
I have printed a letter for Drew Sandoval on [place] [address]. Does he want it mailed, faxed some where, or will he pick it up?

## 2019-07-31 NOTE — Telephone Encounter (Signed)
Letter has been placed up front for pt pick up. Pt is aware. Nothing further was needed.

## 2019-07-31 NOTE — Telephone Encounter (Signed)
Spoke with pt. He needs a work note to be able to work [employer]. Note needs to state that he doesn't have any restrictions.  CY - please advise. Thanks.

## 2019-08-07 NOTE — Progress Notes (Signed)
CARDIOLOGY CONSULT NOTE       Patient ID: Drew Sandoval MRN: [id number] DOB/AGE: March 22, 1976 44 y.o.  Admit date: (Not on file) Referring Physician: Betsey Holiday [facility] Primary Physician: Monico Blitz, MD Primary Cardiologist: New Reason for Consultation: Dyspne/Chest Tightness  Active Problems:   * No active hospital problems. *   HPI:  44 y.o. referred by [facility] Dr Betsey Holiday for dyspnea and chest tightness Seen in ER 07/28/19 for 3 days of dyspnea and wheezing tightness in chest not relieved by Albuterol inhaler He has a history of seasonal allergies and asthma Quit smoking in 2011 Sats were 89% Rx with steroids/nebs and d/c home CXR with NAD Respiratory panel and COVID negative ECG non acute with NSR rate 58 early repolarization no change from 03/11/16   Hospitalized 2/20-2/21 for asthma flare Notes indicate episode of SVT requiring IV beta blocker Rx Indicates occasional palpitations at home Given PRN cardizem on d/c He required adenosine in ER in 2016 SVT rate 190 in setting of multiple Albuterol Rx;s   He gets rapid palpitations at least 2/x week can last an hour. The SA cardizem helps.  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 Asthma   . Seasonal allergies     Family History  Problem Relation Age of Onset  . Emphysema Father   . Lung cancer Mother   . Arthritis Sister     Social History   Socioeconomic History  . Marital status: Single    Spouse name: Not on file  . Number of children: Not on file  . Years of education: Not on file  . Highest education level: Not on file  Occupational History  . Occupation: Financial trader: [employer]  Tobacco Use  . Smoking status: Former Smoker    Packs/day: 0.50    Years: 11.50    Pack years: 5.75    Types: Cigarettes    Quit date: 04/09/2010    Years since quitting: 9.3  . Smokeless  tobacco: Former Systems developer  . Tobacco comment: started at age 54.  1/2 ppd.    Substance and Sexual Activity  . Alcohol use: Yes    Comment: rarely  . Drug use: No  . Sexual activity: Yes    Birth control/protection: None  Other Topics Concern  . Not on file  Social History Narrative   Left handed   Social Determinants of Health   Financial Resource Strain:   . Difficulty of Paying Living Expenses:   Food Insecurity:   . Worried About Charity fundraiser in the Last Year:   . Arboriculturist in the Last Year:   Transportation Needs:   . Film/video editor (Medical):   Marland Kitchen Lack of Transportation (Non-Medical):   Physical Activity:   . Days of Exercise per Week:   . Minutes of Exercise per Session:   Stress:   . Feeling of Stress :   Social Connections:   . Frequency of Communication with Friends and Family:   . Frequency of Social Gatherings with Friends and Family:   . Attends Religious Services:   . Active Member of Clubs or Organizations:   . Attends Archivist Meetings:   Marland Kitchen Marital Status:   Intimate Partner Violence:   . Fear of Current or Ex-Partner:   . Emotionally Abused:   .  Physically Abused:   . Sexually Abused:     Past Surgical History:  Procedure Laterality Date  . None        Current Outpatient Medications:  .  Budeson-Glycopyrrol-Formoterol (BREZTRI AEROSPHERE) 160-9-4.8 MCG/ACT AERO, Inhale 2 puffs into the lungs 2 (two) times daily. Rinse mouth, Disp: 10.7 g, Rfl: 5 .  diltiazem (CARDIZEM) 30 MG tablet, Take 1 tablet (30 mg total) by mouth every 6 (six) hours as needed (palpitations)., Disp: 30 tablet, Rfl: 0 .  EPINEPHrine 0.3 mg/0.3 mL IJ SOAJ injection, Inject into thigh for severe allergic reaction (Patient taking differently: Inject 0.3 mg into the muscle once. Inject into thigh for severe allergic reaction), Disp: 1 Device, Rfl: prn .  levalbuterol (XOPENEX HFA) 45 MCG/ACT inhaler, Inhale 2 puffs every 4-6 hours if needed- rescue, Disp:  1 Inhaler, Rfl: 12 .  levalbuterol (XOPENEX) 0.31 MG/3ML nebulizer solution, Take 3 mLs (0.31 mg total) by nebulization every 4 (four) hours as needed for wheezing., Disp: 75 mL, Rfl: 12 .  montelukast (SINGULAIR) 10 MG tablet, TAKE 1 TABLET BY MOUTH EVERYDAY AT BEDTIME (Patient taking differently: Take 10 mg by mouth at bedtime. ), Disp: 90 tablet, Rfl: 3 .  primidone (MYSOLINE) 50 MG tablet, Take 1 tablet (50 mg total) by mouth 2 (two) times daily., Disp: 180 tablet, Rfl: 1 .  Reslizumab (CINQAIR IV), Inject 228.3 mg into the vein every 30 (thirty) days., Disp: , Rfl:     Physical Exam: Blood pressure 130/84, pulse 68, temperature (!) 97.4 F (36.3 C), height 6\' 3"  (1.905 m), weight 176 lb (79.8 kg), SpO2 95 %.    Affect appropriate Healthy:  appears stated age HEENT: normal Neck supple with no adenopathy JVP normal no bruits no thyromegaly Lungs exp wheezing and good diaphragmatic motion Heart:  S1/S2 no murmur, no rub, gallop or click PMI normal Abdomen: benighn, BS positve, no tenderness, no AAA no bruit.  No HSM or HJR Distal pulses intact with no bruits No edema Neuro non-focal Skin warm and dry No muscular weakness   Labs:   Lab Results  Component Value Date   WBC 4.5 07/29/2019   HGB 15.1 07/29/2019   HCT 47.2 07/29/2019   MCV 93.5 07/29/2019   PLT 237 07/29/2019   No results for input(s): NA, K, CL, CO2, BUN, CREATININE, CALCIUM, PROT, BILITOT, ALKPHOS, ALT, AST, GLUCOSE in the last 168 hours.  Invalid input(s): LABALBU Lab Results  Component Value Date   CKTOTAL 101 04/11/2011   CKMB 1.4 04/11/2011   TROPONINI <0.03 03/10/2016   No results found for: CHOL No results found for: HDL No results found for: LDLCALC No results found for: TRIG No results found for: CHOLHDL No results found for: LDLDIRECT    Radiology: [facility] Chest Port 1 View  Result Date: 07/28/2019 CLINICAL DATA:  Shortness of breath and chest pain x3 days. EXAM: PORTABLE CHEST 1 VIEW  COMPARISON:  June 30, 2019 FINDINGS: Mild, stable increased lung markings are seen without evidence of acute infiltrate, pleural effusion or pneumothorax. The heart size and mediastinal contours are within normal limits. The visualized skeletal structures are unremarkable. IMPRESSION: No active disease. Electronically Signed   By: July 02, 2019 M.D.   On: 07/28/2019 23:35    EKG: 07/30/19 SR rate 62 normal no pre excitation    ASSESSMENT AND PLAN:   1. SVT: more frequent  in setting of asthma exacerbation Only required RX with adenosine once in 2016 will check echo to r/o structural heart disease.  Event monitor  Change to regular LA Cardizem CD 120 mg daily  Avoid beta blockers with severe asthma Will arrange f/u with Dr Ladona Ridgel after echo and monitor  To discuss possible ablation   2. Asthma:  With elevated IGE and multiple ER visits/admissions for exacerbation F/U Dr Maple Hudson in pulmonary Continue Trelegy, singulair, xopenex and Breztri  Signed: Charlton Haws 08/10/2019, 11:18 AM

## 2019-08-10 ENCOUNTER — Ambulatory Visit (INDEPENDENT_AMBULATORY_CARE_PROVIDER_SITE_OTHER): Payer: BC Managed Care – PPO

## 2019-08-10 ENCOUNTER — Encounter: Payer: Self-pay | Admitting: Cardiovascular Disease

## 2019-08-10 ENCOUNTER — Ambulatory Visit (INDEPENDENT_AMBULATORY_CARE_PROVIDER_SITE_OTHER): Payer: BC Managed Care – PPO | Admitting: Cardiovascular Disease

## 2019-08-10 VITALS — BP 130/84 | HR 68 | Temp 97.4°F | Ht 75.0 in | Wt 176.0 lb

## 2019-08-10 DIAGNOSIS — I471 Supraventricular tachycardia: Secondary | ICD-10-CM

## 2019-08-10 MED ORDER — DILTIAZEM HCL ER COATED BEADS 120 MG PO CP24: 120.0000 mg | ORAL_CAPSULE | Freq: Every day | ORAL | 3 refills | Status: DC

## 2019-08-10 NOTE — Patient Instructions (Signed)
Medication Instructions:  START CARDIZEM 120 MG DAILY    Labwork: NONE  Testing/Procedures: Your physician has recommended that you wear an event monitor. Event monitors are medical devices that record the heart's electrical activity. Doctors most often [place] these monitors to diagnose arrhythmias. Arrhythmias are problems with the speed or rhythm of the heartbeat. The monitor is a small, portable device. You can wear one while you do your normal daily activities. This is usually used to diagnose what is causing palpitations/syncope (passing out).  Your physician has requested that you have an echocardiogram. Echocardiography is a painless test that uses sound waves to create images of your heart. It provides your doctor with information about the size and shape of your heart and how well your heart's chambers and valves are working. This procedure takes approximately one hour. There are no restrictions for this procedure.    Follow-Up: Your physician recommends that you schedule a follow-up appointment in: AS NEEDED    Any Other Special Instructions Will Be Listed Below (If Applicable).  You have been referred to CARDIAC EP (DR. Ladona Ridgel)    If you need a refill on your cardiac medications before your next appointment, please call your pharmacy.

## 2019-08-13 NOTE — Progress Notes (Signed)
Virtual Visit Via Video   The purpose of this virtual visit is to provide medical care while limiting exposure to the novel coronavirus.    Consent was obtained for video visit:  Yes.   Answered questions that patient had about telehealth interaction:  Yes.   I discussed the limitations, risks, security and privacy concerns of performing an evaluation and management service by telemedicine. I also discussed with the patient that there may be a patient responsible charge related to this service. The patient expressed understanding and agreed to proceed.  Pt location: Home Physician Location: home Name of referring provider:  Monico Blitz, MD I connected with Drew Sandoval at patients initiation/request on 08/15/2019  at  8:15 AM EDT  by video enabled telemedicine application and verified that I am speaking with the correct person using two identifiers. Pt MRN:  [id number] Pt DOB:  Aug 03, 1975 Video Participants: Drew Sandoval  Drew Sandoval was seen today in follow up for moderate essential tremor, exacerbated by medications for his lungs.  Patient was started on primidone last visit and worked to 50 mg twice daily my previous records were reviewed prior to todays visit. It helped some but "I still have the shakes a lot."  Pt denies falls.  Pt denies lightheadedness, near syncope.  No hallucinations.  Mood has been good.  Medical records have been reviewed since our last visit.  Patient was hospitalized for day in February after running out of his asthma medication.  While in the hospital, he did have an episode of SVT.  Was given diltiazem for as needed use at home.  Was referred to outpatient cardiology for further evaluation.  Has seen Dr. Johnsie Cancel  Current prescribed movement disorder medications: Primidone, 50 mg twice per day    ALLERGIES:   Allergies  Allergen Reactions  . Azithromycin Rash  . Alvesco [Ciclesonide]     Possible reaction- rash  . Anoro Ellipta  [Umeclidinium-Vilanterol]     Possible- rash  . Banana Swelling    Throat swelling   . Methylprednisolone   . Penicillins Rash    Has patient had a PCN reaction causing immediate rash, facial/tongue/throat swelling, SOB or lightheadedness with hypotension: Yes Has patient had a PCN reaction causing severe rash involving mucus membranes or skin necrosis: No Has patient had a PCN reaction that required hospitalization Yes Has patient had a PCN reaction occurring within the last 10 years: No If all of the above answers are "NO", then may proceed with Cephalosporin use.      CURRENT MEDICATIONS:  Outpatient Encounter Medications as of 08/15/2019  Medication Sig  . diltiazem (CARDIZEM) 30 MG tablet Take 1 tablet (30 mg total) by mouth every 6 (six) hours as needed (palpitations).  . montelukast (SINGULAIR) 10 MG tablet TAKE 1 TABLET BY MOUTH EVERYDAY AT BEDTIME (Patient taking differently: Take 10 mg by mouth at bedtime. )  . primidone (MYSOLINE) 50 MG tablet Take 1 tablet (50 mg total) by mouth 2 (two) times daily.  Marland Kitchen Reslizumab (CINQAIR IV) Inject 228.3 mg into the vein every 30 (thirty) days.  . Budeson-Glycopyrrol-Formoterol (BREZTRI AEROSPHERE) 160-9-4.8 MCG/ACT AERO Inhale 2 puffs into the lungs 2 (two) times daily. Rinse mouth  . EPINEPHrine 0.3 mg/0.3 mL IJ SOAJ injection Inject into thigh for severe allergic reaction (Patient taking differently: Inject 0.3 mg into the muscle once. Inject into thigh for severe allergic reaction)  . levalbuterol (XOPENEX HFA) 45 MCG/ACT inhaler Inhale 2 puffs every 4-6 hours if needed- rescue  .  levalbuterol (XOPENEX) 0.31 MG/3ML nebulizer solution Take 3 mLs (0.31 mg total) by nebulization every 4 (four) hours as needed for wheezing.  . [DISCONTINUED] diltiazem (CARDIZEM CD) 120 MG 24 hr capsule Take 1 capsule (120 mg total) by mouth daily.   No facility-administered encounter medications on file as of 08/15/2019.    PHYSICAL EXAMINATION:    VITALS:    Vitals:   08/15/19 0808  Weight: 177 lb (80.3 kg)  Height: 6\' 3"  (1.905 m)    GEN:  The patient appears stated age and is in NAD. HEENT:  Normocephalic, atraumatic.  The mucous membranes are moist.    Neurological examination:  Orientation: The patient is alert and oriented x3. Cranial nerves: There is good facial symmetry. The speech is fluent and clear.  Hearing is intact to conversational tone. Motor: Strength is at least antigravity x4.  Movement examination: Abnormal movements: min tremor of the outstretched hands bilaterally   ASSESSMENT/PLAN:  1.  Essential Tremor  -increase primidone, 50 mg, 2 in the AM, 1 at bed  2.  Asthma  -Contributes to his tremor  3.  Recent SVT  -following with Dr. and now on diltiazem  4.  F/u 4-6 months  Cc:  Drew Emms, MD

## 2019-08-14 ENCOUNTER — Encounter (HOSPITAL_COMMUNITY): Admission: RE | Admit: 2019-08-14 | Payer: BC Managed Care – PPO | Source: Ambulatory Visit

## 2019-08-15 ENCOUNTER — Telehealth (INDEPENDENT_AMBULATORY_CARE_PROVIDER_SITE_OTHER): Payer: BC Managed Care – PPO | Admitting: Neurology

## 2019-08-15 ENCOUNTER — Other Ambulatory Visit: Payer: Self-pay

## 2019-08-15 ENCOUNTER — Encounter: Payer: Self-pay | Admitting: Neurology

## 2019-08-15 VITALS — Ht 75.0 in | Wt 177.0 lb

## 2019-08-15 DIAGNOSIS — J4550 Severe persistent asthma, uncomplicated: Secondary | ICD-10-CM | POA: Diagnosis not present

## 2019-08-15 DIAGNOSIS — G250 Essential tremor: Secondary | ICD-10-CM | POA: Diagnosis not present

## 2019-08-15 MED ORDER — PRIMIDONE 50 MG PO TABS: ORAL_TABLET | ORAL | 1 refills | Status: DC

## 2019-08-17 ENCOUNTER — Encounter (HOSPITAL_COMMUNITY)
Admission: RE | Admit: 2019-08-17 | Discharge: 2019-08-17 | Disposition: A | Discharge status: Home / Self Care | Payer: BC Managed Care – PPO | Source: Ambulatory Visit | Attending: Internal Medicine | Admitting: Internal Medicine

## 2019-08-17 ENCOUNTER — Encounter (HOSPITAL_COMMUNITY): Payer: Self-pay

## 2019-08-17 ENCOUNTER — Ambulatory Visit (HOSPITAL_COMMUNITY)
Admission: RE | Admit: 2019-08-17 | Discharge: 2019-08-17 | Disposition: A | Discharge status: Home / Self Care | Payer: BC Managed Care – PPO | Source: Ambulatory Visit | Attending: Internal Medicine | Admitting: Internal Medicine

## 2019-08-17 ENCOUNTER — Other Ambulatory Visit: Payer: Self-pay

## 2019-08-17 DIAGNOSIS — I471 Supraventricular tachycardia: Secondary | ICD-10-CM | POA: Diagnosis not present

## 2019-08-17 DIAGNOSIS — J4550 Severe persistent asthma, uncomplicated: Secondary | ICD-10-CM | POA: Diagnosis not present

## 2019-08-17 MED ORDER — SODIUM CHLORIDE 0.9 % IV SOLN
228.3000 mg | Freq: Once | INTRAVENOUS | Status: AC
  Administered 2019-08-17: 228.3 mg via INTRAVENOUS
  Filled 2019-08-17: qty 22.83

## 2019-08-17 MED ORDER — SODIUM CHLORIDE 0.9 % IV SOLN: Freq: Once | INTRAVENOUS | Status: AC

## 2019-08-17 NOTE — Progress Notes (Signed)
*  PRELIMINARY RESULTS* Echocardiogram 2D Echocardiogram has been performed.  Stacey Drain 08/17/2019, 9:16 AM

## 2019-08-20 ENCOUNTER — Telehealth: Payer: Self-pay | Admitting: Internal Medicine

## 2019-08-20 MED ORDER — PREDNISONE 10 MG PO TABS: ORAL_TABLET | ORAL | 0 refills | Status: DC

## 2019-08-20 NOTE — Telephone Encounter (Signed)
Called and spoke with pt who stated he began having problems of SOB and chest tightness overnight and stated he is still experiencing problems.  Pt stated he had to use his xopenex inhaler last night about 4 times and today he has had to use it once today. Pt stated that he did not get much relief from the inhaler. Pt stated that he used his nebulizer this morning before he laid down and also did not get much relief from that either.  Pt stated that he is using his breztri inhaler as well as all other meds as prescribed.  Pt denies any complaints of wheezing. Pt states he is coughing a lot and states he is coughing up clear phlegm. Pt denies any complaints of fever.  Pt wants to know what we recommend to help with symptoms.  Dr. Maple Hudson, please advise.  Allergies  Allergen Reactions  . Azithromycin Rash  . Alvesco [Ciclesonide]     Possible reaction- rash  . Anoro Ellipta [Umeclidinium-Vilanterol]     Possible- rash  . Banana Swelling    Throat swelling   . Methylprednisolone   . Penicillins Rash    Has patient had a PCN reaction causing immediate rash, facial/tongue/throat swelling, SOB or lightheadedness with hypotension: Yes Has patient had a PCN reaction causing severe rash involving mucus membranes or skin necrosis: No Has patient had a PCN reaction that required hospitalization Yes Has patient had a PCN reaction occurring within the last 10 years: No If all of the above answers are "NO", then may proceed with Cephalosporin use.       Current Outpatient Medications:  .  Budeson-Glycopyrrol-Formoterol (BREZTRI AEROSPHERE) 160-9-4.8 MCG/ACT AERO, Inhale 2 puffs into the lungs 2 (two) times daily. Rinse mouth, Disp: 10.7 g, Rfl: 5 .  diltiazem (CARDIZEM) 30 MG tablet, Take 1 tablet (30 mg total) by mouth every 6 (six) hours as needed (palpitations)., Disp: 30 tablet, Rfl: 0 .  EPINEPHrine 0.3 mg/0.3 mL IJ SOAJ injection, Inject into thigh for severe allergic reaction (Patient  taking differently: Inject 0.3 mg into the muscle once. Inject into thigh for severe allergic reaction), Disp: 1 Device, Rfl: prn .  levalbuterol (XOPENEX HFA) 45 MCG/ACT inhaler, Inhale 2 puffs every 4-6 hours if needed- rescue, Disp: 1 Inhaler, Rfl: 12 .  levalbuterol (XOPENEX) 0.31 MG/3ML nebulizer solution, Take 3 mLs (0.31 mg total) by nebulization every 4 (four) hours as needed for wheezing., Disp: 75 mL, Rfl: 12 .  montelukast (SINGULAIR) 10 MG tablet, TAKE 1 TABLET BY MOUTH EVERYDAY AT BEDTIME (Patient taking differently: Take 10 mg by mouth at bedtime. ), Disp: 90 tablet, Rfl: 3 .  primidone (MYSOLINE) 50 MG tablet, 2 in the AM, 1 at night, Disp: 270 tablet, Rfl: 1 .  Reslizumab (CINQAIR IV), Inject 228.3 mg into the vein every 30 (thirty) days., Disp: , Rfl:

## 2019-08-20 NOTE — Telephone Encounter (Signed)
Offer prednisone 10 mg, # 30     2 daily x 10 days, then 1 daily x 10 days

## 2019-08-20 NOTE — Telephone Encounter (Signed)
Called and spoke with pt letting him know the info stated by Dr. Maple Hudson and pt verbalized understanding. Verified pt's preferred pharmacy and sent rx in for pt. Nothing further needed.

## 2019-08-22 ENCOUNTER — Ambulatory Visit: Payer: BC Managed Care – PPO | Admitting: Internal Medicine

## 2019-08-29 ENCOUNTER — Other Ambulatory Visit: Payer: Self-pay

## 2019-08-29 ENCOUNTER — Ambulatory Visit (INDEPENDENT_AMBULATORY_CARE_PROVIDER_SITE_OTHER): Payer: BC Managed Care – PPO | Admitting: Internal Medicine

## 2019-08-29 ENCOUNTER — Encounter: Payer: Self-pay | Admitting: Internal Medicine

## 2019-08-29 VITALS — BP 146/100 | HR 64 | Temp 97.3°F | Ht 75.0 in | Wt 178.0 lb

## 2019-08-29 DIAGNOSIS — I471 Supraventricular tachycardia: Secondary | ICD-10-CM | POA: Diagnosis not present

## 2019-08-29 NOTE — Progress Notes (Signed)
HPI Drew Sandoval is referred today by Dr. Johnsie Cancel for evaluation of SVT. He is a pleasant 44 yo man with a h/o asthma who has been recently well controlled with medical therapy. He presented initially with SVT in 2016 and was treated with IV adenosine.  Allergies  Allergen Reactions  . Azithromycin Rash  . Alvesco [Ciclesonide]     Possible reaction- rash  . Anoro Ellipta [Umeclidinium-Vilanterol]     Possible- rash  . Banana Swelling    Throat swelling   . Methylprednisolone   . Penicillins Rash    Has patient had a PCN reaction causing immediate rash, facial/tongue/throat swelling, SOB or lightheadedness with hypotension: Yes Has patient had a PCN reaction causing severe rash involving mucus membranes or skin necrosis: No Has patient had a PCN reaction that required hospitalization Yes Has patient had a PCN reaction occurring within the last 10 years: No If all of the above answers are "NO", then may proceed with Cephalosporin use.       Current Outpatient Medications  Medication Sig Dispense Refill  . Budeson-Glycopyrrol-Formoterol (BREZTRI AEROSPHERE) 160-9-4.8 MCG/ACT AERO Inhale 2 puffs into the lungs 2 (two) times daily. Rinse mouth 10.7 g 5  . diltiazem (CARDIZEM) 30 MG tablet Take 1 tablet (30 mg total) by mouth every 6 (six) hours as needed (palpitations). 30 tablet 0  . EPINEPHrine 0.3 mg/0.3 mL IJ SOAJ injection Inject into thigh for severe allergic reaction (Patient taking differently: Inject 0.3 mg into the muscle once. Inject into thigh for severe allergic reaction) 1 Device prn  . levalbuterol (XOPENEX HFA) 45 MCG/ACT inhaler Inhale 2 puffs every 4-6 hours if needed- rescue 1 Inhaler 12  . levalbuterol (XOPENEX) 0.31 MG/3ML nebulizer solution Take 3 mLs (0.31 mg total) by nebulization every 4 (four) hours as needed for wheezing. 75 mL 12  . montelukast (SINGULAIR) 10 MG tablet TAKE 1 TABLET BY MOUTH EVERYDAY AT BEDTIME (Patient taking differently: Take 10 mg by  mouth at bedtime. ) 90 tablet 3  . predniSONE (DELTASONE) 10 MG tablet Take 2 tablets for 10 days and then 1 tablet for 10 days and then stop 30 tablet 0  . primidone (MYSOLINE) 50 MG tablet 2 in the AM, 1 at night 270 tablet 1  . Reslizumab (CINQAIR IV) Inject 228.3 mg into the vein every 30 (thirty) days.     No current facility-administered medications for this visit.     Past Medical History:  Diagnosis Date  . Asthma   . Seasonal allergies     ROS:   All systems reviewed and negative except as noted in the HPI.   Past Surgical History:  Procedure Laterality Date  . None       Family History  Problem Relation Age of Onset  . Emphysema Father   . Lung cancer Mother   . Arthritis Sister      Social History   Socioeconomic History  . Marital status: Single    Spouse name: Not on file  . Number of children: Not on file  . Years of education: Not on file  . Highest education level: Not on file  Occupational History  . Occupation: Financial trader: [employer]  Tobacco Use  . Smoking status: Former Smoker    Packs/day: 0.50    Years: 11.50    Pack years: 5.75    Types: Cigarettes    Quit date: 04/09/2010    Years since quitting: 9.3  . Smokeless  tobacco: Former Neurosurgeon  . Tobacco comment: started at age 68.  1/2 ppd.    Substance and Sexual Activity  . Alcohol use: Yes    Comment: rarely  . Drug use: No  . Sexual activity: Yes    Birth control/protection: None  Other Topics Concern  . Not on file  Social History Narrative   Left handed   Social Determinants of Health   Financial Resource Strain:   . Difficulty of Paying Living Expenses:   Food Insecurity:   . Worried About Programme researcher, broadcasting/film/video in the Last Year:   . Barista in the Last Year:   Transportation Needs:   . Freight forwarder (Medical):   Marland Kitchen Lack of Transportation (Non-Medical):   Physical Activity:   . Days of Exercise per Week:   . Minutes of Exercise per Session:     Stress:   . Feeling of Stress :   Social Connections:   . Frequency of Communication with Friends and Family:   . Frequency of Social Gatherings with Friends and Family:   . Attends Religious Services:   . Active Member of Clubs or Organizations:   . Attends Banker Meetings:   Marland Kitchen Marital Status:   Intimate Partner Violence:   . Fear of Current or Ex-Partner:   . Emotionally Abused:   Marland Kitchen Physically Abused:   . Sexually Abused:      BP (!) 146/100   Pulse 64   Temp (!) 97.3 F (36.3 C)   Ht 6\' 3"  (1.905 m)   Wt 178 lb (80.7 kg)   SpO2 97%   BMI 22.25 kg/m   Physical Exam:  Well appearing NAD HEENT: Unremarkable Neck:  No JVD, no thyromegally Lymphatics:  No adenopathy Back:  No CVA tenderness Lungs:  Clear with no wheezes HEART:  Regular rate rhythm, no murmurs, no rubs, no clicks Abd:  soft, positive bowel sounds, no organomegally, no rebound, no guarding Ext:  2 plus pulses, no edema, no cyanosis, no clubbing Skin:  No rashes no nodules Neuro:  CN II through XII intact, motor grossly intact  EKG Reviewed. NSR with no pre-excitation  Assess/Plan: 1. SVT - I have reviewed his old ECG's including the SVT at 190. He notes that his symptoms have worsened and his episodes of SVT have increased in frequency despite taking cardizem. He could not take a beta blocker due to asthma. I have discussed the indications/risks/benefits/goals/expectations of EP study and catheter ablation and he will call if he wishes to proceed. I would recommend CARTO for his ablation.  2. Asthma - he is doing well on his bronchodilator therapy. He has no wheezing today.  3. HTN - his BP is up today. We will need to work on this.   [place].D.

## 2019-08-29 NOTE — H&P (View-Only) (Signed)
    HPI Mr. Fill is referred today by Dr. Nishan for evaluation of SVT. He is a pleasant 43 yo man with a h/o asthma who has been recently well controlled with medical therapy. He presented initially with SVT in 2016 and was treated with IV adenosine.  Allergies  Allergen Reactions  . Azithromycin Rash  . Alvesco [Ciclesonide]     Possible reaction- rash  . Anoro Ellipta [Umeclidinium-Vilanterol]     Possible- rash  . Banana Swelling    Throat swelling   . Methylprednisolone   . Penicillins Rash    Has patient had a PCN reaction causing immediate rash, facial/tongue/throat swelling, SOB or lightheadedness with hypotension: Yes Has patient had a PCN reaction causing severe rash involving mucus membranes or skin necrosis: No Has patient had a PCN reaction that required hospitalization Yes Has patient had a PCN reaction occurring within the last 10 years: No If all of the above answers are "NO", then may proceed with Cephalosporin use.       Current Outpatient Medications  Medication Sig Dispense Refill  . Budeson-Glycopyrrol-Formoterol (BREZTRI AEROSPHERE) 160-9-4.8 MCG/ACT AERO Inhale 2 puffs into the lungs 2 (two) times daily. Rinse mouth 10.7 g 5  . diltiazem (CARDIZEM) 30 MG tablet Take 1 tablet (30 mg total) by mouth every 6 (six) hours as needed (palpitations). 30 tablet 0  . EPINEPHrine 0.3 mg/0.3 mL IJ SOAJ injection Inject into thigh for severe allergic reaction (Patient taking differently: Inject 0.3 mg into the muscle once. Inject into thigh for severe allergic reaction) 1 Device prn  . levalbuterol (XOPENEX HFA) 45 MCG/ACT inhaler Inhale 2 puffs every 4-6 hours if needed- rescue 1 Inhaler 12  . levalbuterol (XOPENEX) 0.31 MG/3ML nebulizer solution Take 3 mLs (0.31 mg total) by nebulization every 4 (four) hours as needed for wheezing. 75 mL 12  . montelukast (SINGULAIR) 10 MG tablet TAKE 1 TABLET BY MOUTH EVERYDAY AT BEDTIME (Patient taking differently: Take 10 mg by  mouth at bedtime. ) 90 tablet 3  . predniSONE (DELTASONE) 10 MG tablet Take 2 tablets for 10 days and then 1 tablet for 10 days and then stop 30 tablet 0  . primidone (MYSOLINE) 50 MG tablet 2 in the AM, 1 at night 270 tablet 1  . Reslizumab (CINQAIR IV) Inject 228.3 mg into the vein every 30 (thirty) days.     No current facility-administered medications for this visit.     Past Medical History:  Diagnosis Date  . Asthma   . Seasonal allergies     ROS:   All systems reviewed and negative except as noted in the HPI.   Past Surgical History:  Procedure Laterality Date  . None       Family History  Problem Relation Age of Onset  . Emphysema Father   . Lung cancer Mother   . Arthritis Sister      Social History   Socioeconomic History  . Marital status: Single    Spouse name: Not on file  . Number of children: Not on file  . Years of education: Not on file  . Highest education level: Not on file  Occupational History  . Occupation: sanitation    Employer: [employer]  Tobacco Use  . Smoking status: Former Smoker    Packs/day: 0.50    Years: 11.50    Pack years: 5.75    Types: Cigarettes    Quit date: 04/09/2010    Years since quitting: 9.3  . Smokeless   tobacco: Former User  . Tobacco comment: started at age 11.  1/2 ppd.    Substance and Sexual Activity  . Alcohol use: Yes    Comment: rarely  . Drug use: No  . Sexual activity: Yes    Birth control/protection: None  Other Topics Concern  . Not on file  Social History Narrative   Left handed   Social Determinants of Health   Financial Resource Strain:   . Difficulty of Paying Living Expenses:   Food Insecurity:   . Worried About Running Out of Food in the Last Year:   . Ran Out of Food in the Last Year:   Transportation Needs:   . Lack of Transportation (Medical):   . Lack of Transportation (Non-Medical):   Physical Activity:   . Days of Exercise per Week:   . Minutes of Exercise per Session:     Stress:   . Feeling of Stress :   Social Connections:   . Frequency of Communication with Friends and Family:   . Frequency of Social Gatherings with Friends and Family:   . Attends Religious Services:   . Active Member of Clubs or Organizations:   . Attends Club or Organization Meetings:   . Marital Status:   Intimate Partner Violence:   . Fear of Current or Ex-Partner:   . Emotionally Abused:   . Physically Abused:   . Sexually Abused:      BP (!) 146/100   Pulse 64   Temp (!) 97.3 F (36.3 C)   Ht 6' 3" (1.905 m)   Wt 178 lb (80.7 kg)   SpO2 97%   BMI 22.25 kg/m   Physical Exam:  Well appearing NAD HEENT: Unremarkable Neck:  No JVD, no thyromegally Lymphatics:  No adenopathy Back:  No CVA tenderness Lungs:  Clear with no wheezes HEART:  Regular rate rhythm, no murmurs, no rubs, no clicks Abd:  soft, positive bowel sounds, no organomegally, no rebound, no guarding Ext:  2 plus pulses, no edema, no cyanosis, no clubbing Skin:  No rashes no nodules Neuro:  CN II through XII intact, motor grossly intact  EKG Reviewed. NSR with no pre-excitation  Assess/Plan: 1. SVT - I have reviewed his old ECG's including the SVT at 190. He notes that his symptoms have worsened and his episodes of SVT have increased in frequency despite taking cardizem. He could not take a beta blocker due to asthma. I have discussed the indications/risks/benefits/goals/expectations of EP study and catheter ablation and he will call us if he wishes to proceed. I would recommend CARTO for his ablation.  2. Asthma - he is doing well on his bronchodilator therapy. He has no wheezing today.  3. HTN - his BP is up today. We will need to work on this.   Akhil Piscopo,M.D. 

## 2019-08-29 NOTE — Patient Instructions (Signed)
Medication Instructions:  Your physician recommends that you continue on your current medications as directed. Please refer to the Current Medication list given to you today.  Dates are as follows for Ablation : 4/26, 4/30, 5/3, 5,5, 5,10  *If you need a refill on your cardiac medications before your next appointment, please call your pharmacy*   Lab Work: NONE   If you have labs (blood work) drawn today and your tests are completely normal, you will receive your results only by: Marland Kitchen MyChart Message (if you have MyChart) OR . A paper copy in the mail If you have any lab test that is abnormal or we need to change your treatment, we will call you to review the results.   Testing/Procedures: NONE    Follow-Up: At [facility], you and your health needs are our priority.  As part of our continuing mission to provide you with exceptional heart care, we have created designated Provider Care Teams.  These Care Teams include your primary Cardiologist (physician) and Advanced Practice Providers (APPs -  Physician Assistants and Nurse Practitioners) who all work together to provide you with the care you need, when you need it.  We recommend signing up for the patient portal called "MyChart".  Sign up information is provided on this After Visit Summary.  MyChart is used to connect with patients for Virtual Visits (Telemedicine).  Patients are able to view lab/test results, encounter notes, upcoming appointments, etc.  Non-urgent messages can be sent to your provider as well.   To learn more about what you can do with MyChart, go to [email or web address].    Your next appointment:    Dates given for SVT Ablation   The format for your next appointment:   In Person  Provider:   Lewayne Bunting, MD   Other Instructions Thank you for choosing [facility]!

## 2019-08-30 ENCOUNTER — Telehealth: Payer: Self-pay

## 2019-08-30 DIAGNOSIS — Z01818 Encounter for other preprocedural examination: Secondary | ICD-10-CM

## 2019-08-30 DIAGNOSIS — I471 Supraventricular tachycardia: Secondary | ICD-10-CM

## 2019-08-30 NOTE — Telephone Encounter (Signed)
Per Pt he is ready for an ablation. He was given several dates and he has chosen 09-07-19  Please call [phone]  Thanks renee

## 2019-08-30 NOTE — Telephone Encounter (Signed)
Called pt to notify of date and time of SVT  Ablation. Pt notified of the need of labs and Covid screening. Pt then stated that he is unsure that wants to have ablation done because he has to quarantine from Tuesday until Friday. Pt then ended the call. Nurse tried to call pt back with another date for Ablation and no answer. Left msg to call back.

## 2019-08-31 NOTE — Addendum Note (Signed)
Addended by: Kerney Elbe on: 08/31/2019 03:52 PM   Modules accepted: Orders

## 2019-08-31 NOTE — Telephone Encounter (Signed)
Pt called back to office and stated that he would like to continue with plans for ablation. Information given for Covid screening and lab work. Orders placed.

## 2019-09-04 ENCOUNTER — Other Ambulatory Visit: Payer: Self-pay

## 2019-09-04 ENCOUNTER — Other Ambulatory Visit (HOSPITAL_COMMUNITY)
Admission: RE | Admit: 2019-09-04 | Discharge: 2019-09-04 | Disposition: A | Discharge status: Home / Self Care | Payer: BC Managed Care – PPO | Source: Ambulatory Visit | Attending: Internal Medicine | Admitting: Internal Medicine

## 2019-09-04 ENCOUNTER — Encounter: Payer: Self-pay | Admitting: *Deleted

## 2019-09-04 ENCOUNTER — Telehealth: Payer: Self-pay | Admitting: Internal Medicine

## 2019-09-04 DIAGNOSIS — Z01812 Encounter for preprocedural laboratory examination: Secondary | ICD-10-CM | POA: Insufficient documentation

## 2019-09-04 DIAGNOSIS — Z20822 Contact with and (suspected) exposure to covid-19: Secondary | ICD-10-CM | POA: Insufficient documentation

## 2019-09-04 LAB — BASIC METABOLIC PANEL
Anion gap: 7 (ref 5–15)
BUN: 17 mg/dL (ref 6–20)
CO2: 26 mmol/L (ref 22–32)
Calcium: 9.1 mg/dL (ref 8.9–10.3)
Chloride: 107 mmol/L (ref 98–111)
Creatinine, Ser: 0.84 mg/dL (ref 0.61–1.24)
GFR calc Af Amer: >60 mL/min (ref >60)
GFR calc non Af Amer: >60 mL/min (ref >60)
Glucose, Bld: 93 mg/dL (ref 70–99)
Potassium: 3.4 mmol/L — ABNORMAL LOW (ref 3.5–5.1)
Sodium: 140 mmol/L (ref 135–145)

## 2019-09-04 LAB — CBC
HCT: 44.8 % (ref 39.0–52.0)
Hemoglobin: 14.4 g/dL (ref 13.0–17.0)
MCH: 29.5 pg (ref 26.0–34.0)
MCHC: 32.1 g/dL (ref 30.0–36.0)
MCV: 91.8 fL (ref 80.0–100.0)
Platelets: 234 10*3/uL (ref 150–400)
RBC: 4.88 MIL/uL (ref 4.22–5.81)
RDW: 13.9 % (ref 11.5–15.5)
WBC: 12.2 10*3/uL — ABNORMAL HIGH (ref 4.0–10.5)
nRBC: 0 % (ref 0.0–0.2)

## 2019-09-04 NOTE — Telephone Encounter (Signed)
Nurse spoke with pt, he has his info for procedure

## 2019-09-04 NOTE — Telephone Encounter (Signed)
Please give pt a call concerning what time he's supposed to go for his procedure on Friday

## 2019-09-05 LAB — SARS CORONAVIRUS 2 (TAT 6-24 HRS): SARS Coronavirus 2: NEGATIVE

## 2019-09-06 ENCOUNTER — Telehealth: Payer: Self-pay

## 2019-09-06 ENCOUNTER — Other Ambulatory Visit: Payer: Self-pay

## 2019-09-06 NOTE — Telephone Encounter (Signed)
[facility] called to report 60 second run of SVT. Patient has ablation tomorrow with Dr.Taylor    I will FYI Dr.Taylor

## 2019-09-07 ENCOUNTER — Ambulatory Visit (HOSPITAL_COMMUNITY)
Admission: RE | Admit: 2019-09-07 | Discharge: 2019-09-07 | Disposition: A | Discharge status: Home / Self Care | Payer: BC Managed Care – PPO | Attending: Internal Medicine | Admitting: Internal Medicine

## 2019-09-07 ENCOUNTER — Encounter (HOSPITAL_COMMUNITY)
Admission: RE | Disposition: A | Discharge status: Home / Self Care | Payer: Self-pay | Source: Home / Self Care | Attending: Internal Medicine

## 2019-09-07 ENCOUNTER — Other Ambulatory Visit: Payer: Self-pay

## 2019-09-07 DIAGNOSIS — I471 Supraventricular tachycardia, unspecified: Secondary | ICD-10-CM | POA: Diagnosis present

## 2019-09-07 DIAGNOSIS — Z87891 Personal history of nicotine dependence: Secondary | ICD-10-CM | POA: Diagnosis not present

## 2019-09-07 DIAGNOSIS — I493 Ventricular premature depolarization: Secondary | ICD-10-CM | POA: Insufficient documentation

## 2019-09-07 DIAGNOSIS — Z888 Allergy status to other drugs, medicaments and biological substances status: Secondary | ICD-10-CM | POA: Diagnosis not present

## 2019-09-07 DIAGNOSIS — Z79899 Other long term (current) drug therapy: Secondary | ICD-10-CM | POA: Diagnosis not present

## 2019-09-07 DIAGNOSIS — Z881 Allergy status to other antibiotic agents status: Secondary | ICD-10-CM | POA: Diagnosis not present

## 2019-09-07 DIAGNOSIS — Z880 Allergy status to penicillin: Secondary | ICD-10-CM | POA: Diagnosis not present

## 2019-09-07 DIAGNOSIS — Z825 Family history of asthma and other chronic lower respiratory diseases: Secondary | ICD-10-CM | POA: Insufficient documentation

## 2019-09-07 DIAGNOSIS — Z7952 Long term (current) use of systemic steroids: Secondary | ICD-10-CM | POA: Diagnosis not present

## 2019-09-07 DIAGNOSIS — R030 Elevated blood-pressure reading, without diagnosis of hypertension: Secondary | ICD-10-CM | POA: Diagnosis not present

## 2019-09-07 DIAGNOSIS — J45909 Unspecified asthma, uncomplicated: Secondary | ICD-10-CM | POA: Diagnosis not present

## 2019-09-07 HISTORY — PX: SVT ABLATION: EP1225

## 2019-09-07 LAB — POTASSIUM: Potassium: 4.4 mmol/L (ref 3.5–5.1)

## 2019-09-07 SURGERY — SVT ABLATION

## 2019-09-07 MED ORDER — BUPIVACAINE HCL (PF) 0.25 % IJ SOLN
INTRAMUSCULAR | Status: AC
  Filled 2019-09-07: qty 30

## 2019-09-07 MED ORDER — ISOPROTERENOL HCL 0.2 MG/ML IJ SOLN
INTRAMUSCULAR | Status: AC
  Filled 2019-09-07: qty 5

## 2019-09-07 MED ORDER — SODIUM CHLORIDE 0.9 % IV SOLN
INTRAVENOUS | Status: DC | PRN
  Administered 2019-09-07: 10:00:00 1 ug/min via INTRAVENOUS

## 2019-09-07 MED ORDER — SODIUM CHLORIDE 0.9 % IV SOLN: 250.0000 mL | INTRAVENOUS | Status: DC | PRN

## 2019-09-07 MED ORDER — SODIUM CHLORIDE 0.9 % IV SOLN: INTRAVENOUS | Status: DC

## 2019-09-07 MED ORDER — FENTANYL CITRATE (PF) 100 MCG/2ML IJ SOLN
INTRAMUSCULAR | Status: AC
  Filled 2019-09-07: qty 2

## 2019-09-07 MED ORDER — SODIUM CHLORIDE 0.9% FLUSH: 3.0000 mL | Freq: Two times a day (BID) | INTRAVENOUS | Status: DC

## 2019-09-07 MED ORDER — MIDAZOLAM HCL 5 MG/5ML IJ SOLN
INTRAMUSCULAR | Status: AC
  Filled 2019-09-07: qty 5

## 2019-09-07 MED ORDER — HEPARIN (PORCINE) IN NACL 1000-0.9 UT/500ML-% IV SOLN
INTRAVENOUS | Status: DC | PRN
  Administered 2019-09-07: 500 mL

## 2019-09-07 MED ORDER — SODIUM CHLORIDE 0.9% FLUSH: 3.0000 mL | INTRAVENOUS | Status: DC | PRN

## 2019-09-07 MED ORDER — MIDAZOLAM HCL 5 MG/5ML IJ SOLN
INTRAMUSCULAR | Status: DC | PRN
  Administered 2019-09-07 (×4): 1 mg via INTRAVENOUS

## 2019-09-07 MED ORDER — FENTANYL CITRATE (PF) 100 MCG/2ML IJ SOLN
INTRAMUSCULAR | Status: DC | PRN
  Administered 2019-09-07: 25 ug via INTRAVENOUS
  Administered 2019-09-07 (×2): 12.5 ug via INTRAVENOUS

## 2019-09-07 MED ORDER — ACETAMINOPHEN 325 MG PO TABS
650.0000 mg | ORAL_TABLET | ORAL | Status: DC | PRN
  Administered 2019-09-07: 650 mg via ORAL
  Filled 2019-09-07 (×3): qty 2

## 2019-09-07 MED ORDER — BUPIVACAINE HCL (PF) 0.25 % IJ SOLN
INTRAMUSCULAR | Status: DC | PRN
  Administered 2019-09-07: 40 mL

## 2019-09-07 MED ORDER — ONDANSETRON HCL 4 MG/2ML IJ SOLN: 4.0000 mg | Freq: Four times a day (QID) | INTRAMUSCULAR | Status: DC | PRN

## 2019-09-07 SURGICAL SUPPLY — 11 items
BAG SNAP BAND KOVER 36X36 (MISCELLANEOUS) ×2 IMPLANT
CATH CELSIUS THERM D CV 7F (ABLATOR) ×2 IMPLANT
CATH HEX JOSEPH 2-5-2 65CM 6F (CATHETERS) ×2 IMPLANT
CATH JOSEPH QUAD ALLRED 6F REP (CATHETERS) ×4 IMPLANT
PACK EP LATEX FREE (CUSTOM PROCEDURE TRAY) ×2
PACK EP LF (CUSTOM PROCEDURE TRAY) ×1 IMPLANT
PAD PRO RADIOLUCENT 2001M-C (PAD) ×2 IMPLANT
PATCH CARTO3 (PAD) ×2 IMPLANT
SHEATH PINNACLE 6F 10CM (SHEATH) ×4 IMPLANT
SHEATH PINNACLE 7F 10CM (SHEATH) ×2 IMPLANT
SHEATH PINNACLE 8F 10CM (SHEATH) ×2 IMPLANT

## 2019-09-07 NOTE — Progress Notes (Signed)
Client c/o continued pain right groin; Dr Ladona Ridgel notified and in to see client and checked right groin; no new orders noted

## 2019-09-07 NOTE — Discharge Instructions (Signed)
Post procedure care instructions No driving for 4 days. No lifting over 5 lbs for 1 week. No vigorous or sexual activity for 1 week. You may return to work/your usual activities on 09/14/2019. Keep procedure site clean & dry. If you notice increased pain, swelling, bleeding or pus, call/return!  You may shower, but no soaking baths/hot tubs/pools for 1 week.    Cardiac Ablation, Care After This sheet gives you information about how to care for yourself after your procedure. Your health care provider may also give you more specific instructions. If you have problems or questions, contact your health care provider. What can I expect after the procedure? After the procedure, it is common to have:  Bruising around your puncture site.  Tenderness around your puncture site.  Skipped heartbeats.  Tiredness (fatigue). Follow these instructions at home: Puncture site care   Follow instructions from your health care provider about how to take care of your puncture site. Make sure you: ? Wash your hands with soap and water before you change your bandage (dressing). If soap and water are not available, use hand sanitizer. ? Change your dressing as told by your health care provider. ? Leave stitches (sutures), skin glue, or adhesive strips in place. These skin closures may need to stay in place for up to 2 weeks. If adhesive strip edges start to loosen and curl up, you may trim the loose edges. Do not remove adhesive strips completely unless your health care provider tells you to do that.  Check your puncture site every day for signs of infection. Check for: ? Redness, swelling, or pain. ? Fluid or blood. If your puncture site starts to bleed, lie down on your back, apply firm pressure to the area, and contact your health care provider. ? Warmth. ? Pus or a bad smell. Driving  Ask your health care provider when it is safe for you to drive again after the procedure.  Do not drive or use heavy  machinery while taking prescription pain medicine.  Do not drive for 24 hours if you were given a medicine to help you relax (sedative) during your procedure. Activity  Avoid activities that take a lot of effort for at least 3 days after your procedure.  Do not lift anything that is heavier than 10 lb (4.5 kg), or the limit that you are told, until your health care provider says that it is safe.  Return to your normal activities as told by your health care provider. Ask your health care provider what activities are safe for you. General instructions  Take over-the-counter and prescription medicines only as told by your health care provider.  Do not use any products that contain nicotine or tobacco, such as cigarettes and e-cigarettes. If you need help quitting, ask your health care provider.  Do not take baths, swim, or use a hot tub until your health care provider approves.  Do not drink alcohol for 24 hours after your procedure.  Keep all follow-up visits as told by your health care provider. This is important. Contact a health care provider if:  You have redness, mild swelling, or pain around your puncture site.  You have fluid or blood coming from your puncture site that stops after applying firm pressure to the area.  Your puncture site feels warm to the touch.  You have pus or a bad smell coming from your puncture site.  You have a fever.  You have chest pain or discomfort that spreads to your  neck, jaw, or arm.  You are sweating a lot.  You feel nauseous.  You have a fast or irregular heartbeat.  You have shortness of breath.  You are dizzy or light-headed and feel the need to lie down.  You have pain or numbness in the arm or leg closest to your puncture site. Get help right away if:  Your puncture site suddenly swells.  Your puncture site is bleeding and the bleeding does not stop after applying firm pressure to the area. These symptoms may represent a  serious problem that is an emergency. Do not wait to see if the symptoms will go away. Get medical help right away. Call your local emergency services (911 in the [place] Do not drive yourself to the hospital. Summary  After the procedure, it is normal to have bruising and tenderness at the puncture site in your groin, neck, or forearm.  Check your puncture site every day for signs of infection.  Get help right away if your puncture site is bleeding and the bleeding does not stop after applying firm pressure to the area. This is a medical emergency. This information is not intended to replace advice given to you by your health care provider. Make sure you discuss any questions you have with your health care provider. Document Revised: 04/08/2017 Document Reviewed: 08/05/2016 Elsevier Patient Education  2020 [employer]  Cardiac Ablation  Cardiac ablation is a procedure to stop some heart tissue from causing problems. The heart has many electrical connections. Sometimes these connections make the heart beat very fast or irregularly. Removing some problem areas can improve the heart rhythm or make it normal. What happens before the procedure?  Follow instructions from your doctor about what you cannot eat or drink.  Ask your doctor about: ? Changing or stopping your normal medicines. This is important if you take diabetes medicines or blood thinners. ? Taking medicines such as aspirin and ibuprofen. These medicines can thin your blood. Do not take these medicines before your procedure if your doctor tells you not to.  Plan to have someone take you home.  If you will be going home right after the procedure, plan to have someone with you for 24 hours. What happens during the procedure?  To lower your risk of infection: ? Your health care team will wash or sanitize their hands. ? Your skin will be washed with soap. ? Hair may be removed from your neck or groin.  An IV tube will be put  into one of your veins.  You will be given a medicine to help you relax (sedative).  Skin on your neck or groin will be numbed.  A cut (incision) will be made in your neck or groin.  A needle will be put through your cut and into a vein in your neck or groin.  A tube (catheter) will be put into the needle. The tube will be moved to your heart. X-rays (fluoroscopy) will be used to help guide the tube.  Small devices (electrodes) on the tip of the tube will send out electrical currents.  Dye may be put through the tube. This helps your surgeon see your heart.  Electrical energy will be used to scar (ablate) some heart tissue. Your surgeon may use: ? Heat (radiofrequency energy). ? Laser energy. ? Extreme cold (cryoablation).  The tube will be taken out.  Pressure will be held on your cut. This helps stop bleeding.  A bandage (dressing) will be put on your  cut. The procedure may vary. What happens after the procedure?  You will be monitored until your medicines have worn off.  Your cut will be watched for bleeding. You will need to lie still for a few hours.  Do not drive for 24 hours or as long as your doctor tells you. Summary  Cardiac ablation is a procedure to stop some heart tissue from causing problems.  Electrical energy will be used to scar (ablate) some heart tissue. This information is not intended to replace advice given to you by your health care provider. Make sure you discuss any questions you have with your health care provider. Document Revised: 04/08/2017 Document Reviewed: 03/15/2016 Elsevier Patient Education  2020 [employer].

## 2019-09-07 NOTE — Progress Notes (Signed)
Site area: right groin and R IJ  Site Prior to Removal:  Level 0  Pressure Applied For *30 MINUTES    Minutes Beginning at 1205  Manual:   Yes.    Patient Status During Pull:  Stable  Post Pull Groin Site:  Level 0  Post Pull Instructions Given:  Yes.    Post Pull Pulses Present:  Yes.    Dressing Applied:  Yes.    Comments:  Bed rest started at 1230

## 2019-09-07 NOTE — Interval H&P Note (Signed)
History and Physical Interval Note:  09/07/2019 7:36 AM  Drew Sandoval  has presented today for surgery, with the diagnosis of SVT.  The various methods of treatment have been discussed with the patient and family. After consideration of risks, benefits and other options for treatment, the patient has consented to  Procedure(s): SVT ABLATION (N/A) as a surgical intervention.  The patient's history has been reviewed, patient examined, no change in status, stable for surgery.  I have reviewed the patient's chart and labs.  Questions were answered to the patient's satisfaction.     Lewayne Bunting

## 2019-09-07 NOTE — Progress Notes (Signed)
Discharge instructions reviewed with pt and his girl friend (via telephone) both voice understanding.  °

## 2019-09-11 ENCOUNTER — Encounter (HOSPITAL_COMMUNITY): Payer: BC Managed Care – PPO

## 2019-09-12 ENCOUNTER — Telehealth: Payer: Self-pay

## 2019-09-12 NOTE — Telephone Encounter (Signed)
Pt has bruise after recent ablation. Pt would like to know if this is normal   Please call [phone]   Thanks renee

## 2019-09-13 NOTE — Telephone Encounter (Signed)
Per Dr.Taylor, patient may return to work w/o restrictions on Monday 09/17/2019   I will print letter and patient is going to pick yp.

## 2019-09-13 NOTE — Telephone Encounter (Signed)
Patient was at hair appointment so I spoke with wife.Pt needs work not stating he can return to work w/o restrictions.He is sore at groin site from ablation which I told her is normal.He will take tylenol.

## 2019-09-14 ENCOUNTER — Telehealth: Payer: Self-pay | Admitting: Internal Medicine

## 2019-09-14 ENCOUNTER — Encounter (HOSPITAL_COMMUNITY): Payer: Self-pay

## 2019-09-14 ENCOUNTER — Encounter (HOSPITAL_COMMUNITY)
Admission: RE | Admit: 2019-09-14 | Discharge: 2019-09-14 | Disposition: A | Discharge status: Home / Self Care | Payer: BC Managed Care – PPO | Source: Ambulatory Visit | Attending: Internal Medicine | Admitting: Internal Medicine

## 2019-09-14 ENCOUNTER — Other Ambulatory Visit: Payer: Self-pay

## 2019-09-14 DIAGNOSIS — J4550 Severe persistent asthma, uncomplicated: Secondary | ICD-10-CM | POA: Diagnosis not present

## 2019-09-14 MED ORDER — SODIUM CHLORIDE 0.9 % IV SOLN: Freq: Once | INTRAVENOUS | Status: AC

## 2019-09-14 MED ORDER — SODIUM CHLORIDE 0.9 % IV SOLN
228.3000 mg | Freq: Once | INTRAVENOUS | Status: AC
  Administered 2019-09-14: 228.3 mg via INTRAVENOUS
  Filled 2019-09-14: qty 22.83

## 2019-09-14 NOTE — Telephone Encounter (Signed)
FMLA/disability form received from [employer]. Placed in box for Dr. Ladona Ridgel to review. 09/14/19 vlm

## 2019-10-04 DIAGNOSIS — Z23 Encounter for immunization: Secondary | ICD-10-CM | POA: Diagnosis not present

## 2019-10-05 ENCOUNTER — Encounter (HOSPITAL_COMMUNITY): Payer: BC Managed Care – PPO

## 2019-10-12 ENCOUNTER — Other Ambulatory Visit: Payer: Self-pay

## 2019-10-12 ENCOUNTER — Encounter (HOSPITAL_COMMUNITY)
Admission: RE | Admit: 2019-10-12 | Discharge: 2019-10-12 | Disposition: A | Discharge status: Home / Self Care | Payer: BC Managed Care – PPO | Source: Ambulatory Visit | Attending: Internal Medicine | Admitting: Internal Medicine

## 2019-10-12 ENCOUNTER — Encounter (HOSPITAL_COMMUNITY): Payer: Self-pay

## 2019-10-12 DIAGNOSIS — J4550 Severe persistent asthma, uncomplicated: Secondary | ICD-10-CM | POA: Diagnosis not present

## 2019-10-12 MED ORDER — SODIUM CHLORIDE 0.9 % IV SOLN
228.3000 mg | Freq: Once | INTRAVENOUS | Status: AC
  Administered 2019-10-12: 228.3 mg via INTRAVENOUS
  Filled 2019-10-12: qty 22.83

## 2019-10-12 MED ORDER — SODIUM CHLORIDE 0.9 % IV SOLN: Freq: Once | INTRAVENOUS | Status: AC

## 2019-10-16 ENCOUNTER — Ambulatory Visit: Payer: BC Managed Care – PPO | Admitting: Internal Medicine

## 2019-10-16 ENCOUNTER — Other Ambulatory Visit: Payer: Self-pay

## 2019-10-16 ENCOUNTER — Encounter: Payer: Self-pay | Admitting: Internal Medicine

## 2019-10-16 VITALS — BP 124/62 | HR 70 | Temp 98.1°F | Ht 74.0 in | Wt 175.6 lb

## 2019-10-16 DIAGNOSIS — J3089 Other allergic rhinitis: Secondary | ICD-10-CM | POA: Diagnosis not present

## 2019-10-16 DIAGNOSIS — J302 Other seasonal allergic rhinitis: Secondary | ICD-10-CM

## 2019-10-16 DIAGNOSIS — J45909 Unspecified asthma, uncomplicated: Secondary | ICD-10-CM | POA: Diagnosis not present

## 2019-10-16 DIAGNOSIS — J4551 Severe persistent asthma with (acute) exacerbation: Secondary | ICD-10-CM

## 2019-10-16 DIAGNOSIS — I471 Supraventricular tachycardia: Secondary | ICD-10-CM

## 2019-10-16 LAB — CBC WITH DIFFERENTIAL/PLATELET
Basophils Absolute: 0 10*3/uL (ref 0.0–0.1)
Basophils Relative: 0.4 % (ref 0.0–3.0)
Eosinophils Absolute: 0 10*3/uL (ref 0.0–0.7)
Eosinophils Relative: 0.3 % (ref 0.0–5.0)
HCT: 41.4 % (ref 39.0–52.0)
Hemoglobin: 13.8 g/dL (ref 13.0–17.0)
Lymphocytes Relative: 16.5 % (ref 12.0–46.0)
Lymphs Abs: 1.4 10*3/uL (ref 0.7–4.0)
MCHC: 33.5 g/dL (ref 30.0–36.0)
MCV: 89.4 fl (ref 78.0–100.0)
Monocytes Absolute: 0.4 10*3/uL (ref 0.1–1.0)
Monocytes Relative: 4.3 % (ref 3.0–12.0)
Neutro Abs: 6.5 10*3/uL (ref 1.4–7.7)
Neutrophils Relative %: 78.5 % — ABNORMAL HIGH (ref 43.0–77.0)
Platelets: 224 10*3/uL (ref 150.0–400.0)
RBC: 4.62 Mil/uL (ref 4.22–5.81)
RDW: 14.9 % (ref 11.5–15.5)
WBC: 8.3 10*3/uL (ref 4.0–10.5)

## 2019-10-16 MED ORDER — PREDNISONE 10 MG PO TABS
10.0000 mg | ORAL_TABLET | Freq: Every day | ORAL | 0 refills | Status: DC
Start: 2019-10-16 — End: 2019-11-12

## 2019-10-16 NOTE — Assessment & Plan Note (Signed)
Concerning that he is having trouble now, with his current meds. Plan - prednisone low dose maintenance. Consider changing Cinqair, now that home injection is available. IgE and CBC w diff now to check allergy status while off prednisone.

## 2019-10-16 NOTE — Patient Instructions (Addendum)
Order- lab- CBC w diff, IgE level    Dx asthma exacerbation  Script sent for prednisone to try 10 mg daily- see if this makes you more stable for now.  Continue current meds  Please call if we can help

## 2019-10-16 NOTE — Assessment & Plan Note (Signed)
He feels controlled with Zyrtec

## 2019-10-16 NOTE — Assessment & Plan Note (Signed)
Recent ablation. Followed by cardiology.

## 2019-10-16 NOTE — Progress Notes (Signed)
Patient ID: Drew Sandoval, male    DOB: 1975-09-05    MRN: [id number]  HPI M  former smoker followed for severe asthma/ COPD, chronic sinusitis, hyper-eosinophilia and hyper IgE, complicated by history SVT/cardioversion FENO 03/18/16- 100  High, indicates allergic asthma Office Spirometry 03/18/2016-severe obstructive airways disease FVC 2.62/51%, FEV1 1.30/31%, FEV1/FVC 0.50, FEF 25-75 0.61/14% Office Spirometry 01/25/17-severe obstructive airways disease. FVC 3.52/68%, FEV1 1.80/43%, ratio or 0.51, FEF 25-75% 0.76/18% IgE was too high for Xolair. He could not afford Nucala or allergy vaccine. Cinqair infusion@ [facility]- ordered 07/21/17 ----------------------------------------------------  07/16/19- 44 year old male former smoker followed for severe asthma/ COPD/ Cinqair, chronic sinusitis, persistent eosinophilia, hyper IgE-too high for Xolair, history SVT/cardioversion, Kidney stone [facility] 2/20-2/21 for asthma exacerbation. Had episode SVT. Needs outpt cardiology referral. Had run out of Trelegy 3 days. Had ED visit 2/4, Rx'd steroids. ------Severe persistent asthma with acute exacerbation. Patient stated breaking is ok today Trelegy 100, Cinqair IV, Singulair, Ventolin hfa, neb albuterol Used neb 2x last week, rescue yesterday. Has finished prednisone. Trelegy was too expensive, so out of it for a while. Has filled it now. We can give print script for Breztri to  price compare. He apparently never had cardiology f/u after SVT years ago, and had not told us about recurrent palpitation. CXR 06/30/19- 1V- IMPRESSION: No evidence of acute cardiopulmonary disease.  10/16/19- 44 year old male former smoker followed for severe asthma/ COPD/ Cinqair, chronic sinusitis, persistent eosinophilia, hyper IgE-too high for Xolair, history SVT/cardioversion, Kidney stone Breztri, Cinqair IV, Singulair, Ventolin hfa, neb albuterol -----no complaints  Had SVT ablation 09/07/19 Describes asthma exacerbation over  past month, esp with exertion, blaming "heat and allergies". No recent prednisone, but using his neb, Breztri, singulair, albuterol hfa. Girlfirend tells him he wheezes in sleep. No obvious infection. Most recent Cinqair infusion last week.  Feels well today while inactive indoors.  CXR 1V 07/28/19-  FINDINGS: Mild, stable increased lung markings are seen without evidence of acute infiltrate, pleural effusion or pneumothorax. The heart size and mediastinal contours are within normal limits. The visualized skeletal structures are unremarkable. IMPRESSION: No active disease.  ROS-see HPI  "+" = positive Constitutional:    weight loss, night sweats, fevers, chills, fatigue, lassitude. HEENT:    headaches, difficulty swallowing, tooth/dental problems, sore throat,       sneezing, itching, ear ache, +nasal congestion, +post nasal drip, snoring CV:    chest pain, orthopnea, PND, swelling in lower extremities, anasarca,                                                      dizziness, palpitations Resp:   +shortness of breath with exertion or at rest.                productive cough,  non-productive cough, coughing up of blood.              change in color of mucus.  +wheezing.   Skin:    rash or lesions. GI:  No-   heartburn, indigestion, abdominal pain, nausea, vomiting,  GU: d. MS:   joint pain, stiffness, decreased range of motion, back pain. Neuro-     nothing unusual Psych:  change in mood or affect.  depression or anxiety.   memory loss.  Objective:   Physical Exam General- Alert, Oriented, Affect-reserved/ laconic, Distress-none acute;  Tall, thin Skin- +tatoos Lymphadenopathy- none Head- atraumatic            Eyes- Gross vision intact, PERRLA, conjunctivae clear secretions            Ears- Hearing, canals normal            Nose- Clear, no-Septal dev, mucus, polyps, erosion, perforation             Throat- Mallampati II , mucosa clear , drainage- none, tonsils- atrophic Neck-  flexible , trachea midline, no stridor , thyroid nl, carotid no bruit Chest - symmetrical excursion , unlabored           Heart/CV- RRR  , no murmur , no gallop  , no rub, nl s1 s2                           - JVD- none , edema- none, stasis changes- none, varices- none           Lung-   Diminished/ clear, unlabored,  Cough-none, dullness-none, rub- none,  Wheeze-none Abd- scaphoid  Br/ Gen/ Rectal- Not done, not indicated Extrem- cyanosis- none, clubbing, none, atrophy- none, strength- nl Neuro- +slight tremor

## 2019-10-17 ENCOUNTER — Ambulatory Visit: Payer: BC Managed Care – PPO | Admitting: Internal Medicine

## 2019-10-17 ENCOUNTER — Encounter: Payer: Self-pay | Admitting: Internal Medicine

## 2019-10-17 VITALS — BP 136/84 | HR 73 | Ht 74.0 in | Wt 175.6 lb

## 2019-10-17 DIAGNOSIS — I471 Supraventricular tachycardia: Secondary | ICD-10-CM | POA: Diagnosis not present

## 2019-10-17 NOTE — Progress Notes (Signed)
HPI Drew Sandoval returns today for followup. He is a pleasant 44 yo man with a h/o SVT who underwent EP study and catheter ablation a couple of weeks ago. 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 Since his ablation, he notes that he feels like his heart might try to race for a second or two but then beats normally. No syncope. He has had some wheezing and has been started back on prednisone. Allergies  Allergen Reactions   Azithromycin Rash   Alvesco [Ciclesonide]     Possible reaction- rash   Anoro Ellipta [Umeclidinium-Vilanterol]     Possible- rash   Banana Swelling    Throat swelling    Methylprednisolone Other (See Comments)    unknown   Penicillins Rash    Has patient had a PCN reaction causing immediate rash, facial/tongue/throat swelling, SOB or lightheadedness with hypotension: Yes Has patient had a PCN reaction causing severe rash involving mucus membranes or skin necrosis: No Has patient had a PCN reaction that required hospitalization Yes Has patient had a PCN reaction occurring within the last 10 years: No If all of the above answers are "NO", then may proceed with Cephalosporin use.       Current Outpatient Medications  Medication Sig Dispense Refill   albuterol (VENTOLIN HFA) 108 (90 Base) MCG/ACT inhaler Inhale 1-2 puffs into the lungs every 6 (six) hours as needed for wheezing or shortness of breath.     Budeson-Glycopyrrol-Formoterol (BREZTRI AEROSPHERE) 160-9-4.8 MCG/ACT AERO Inhale 2 puffs into the lungs 2 (two) times daily. Rinse mouth 10.7 g 5   diltiazem (DILACOR XR) 120 MG 24 hr capsule Take 120 mg by mouth daily.     EPINEPHrine 0.3 mg/0.3 mL IJ SOAJ injection Inject into thigh for severe allergic reaction 1 Device prn   levalbuterol (XOPENEX) 0.31 MG/3ML nebulizer solution Take 3 mLs (0.31 mg total) by nebulization every 4 (four) hours as needed for wheezing. 75 mL 12   montelukast (SINGULAIR) 10 MG tablet TAKE  1 TABLET BY MOUTH EVERYDAY AT BEDTIME 90 tablet 3   predniSONE (DELTASONE) 10 MG tablet Take 1 tablet (10 mg total) by mouth daily with breakfast. 20 tablet 0   primidone (MYSOLINE) 50 MG tablet 2 in the AM, 1 at night 270 tablet 1   Reslizumab (CINQAIR IV) Inject 228.3 mg into the vein every 30 (thirty) days.     No current facility-administered medications for this visit.     Past Medical History:  Diagnosis Date   Asthma    Seasonal allergies     ROS:   All systems reviewed and negative except as noted in the HPI.   Past Surgical History:  Procedure Laterality Date   None     SVT ABLATION N/A 09/07/2019   Procedure: SVT ABLATION;  Surgeon: Evans Lance, MD;  Location: [facility];  Service: Cardiovascular;  Laterality: N/A;     Family History  Problem Relation Age of Onset   Emphysema Father    Lung cancer Mother    Arthritis Sister      Social History   Socioeconomic History   Marital status: Single    Spouse name: Not on file   Number of children: Not on file   Years of education: Not on file   Highest education level: Not on file  Occupational History   Occupation: sanitation    Employer: [employer]  Tobacco Use   Smoking status:  Former Smoker    Packs/day: 0.50    Years: 11.50    Pack years: 5.75    Types: Cigarettes    Quit date: 04/09/2010    Years since quitting: 9.5   Smokeless tobacco: Former Neurosurgeon   Tobacco comment: started at age 61.  1/2 ppd.    Substance and Sexual Activity   Alcohol use: Yes    Comment: rarely   Drug use: No   Sexual activity: Yes    Birth control/protection: None  Other Topics Concern   Not on file  Social History Narrative   Left handed   Social Determinants of Health   Financial Resource Strain:    Difficulty of Paying Living Expenses:   Food Insecurity:    Worried About Programme researcher, broadcasting/film/video in the Last Year:    Barista in the Last Year:   Transportation Needs:     Freight forwarder (Medical):    Lack of Transportation (Non-Medical):   Physical Activity:    Days of Exercise per Week:    Minutes of Exercise per Session:   Stress:    Feeling of Stress :   Social Connections:    Frequency of Communication with Friends and Family:    Frequency of Social Gatherings with Friends and Family:    Attends Religious Services:    Active Member of Clubs or Organizations:    Attends Engineer, structural:    Marital Status:   Intimate Partner Violence:    Fear of Current or Ex-Partner:    Emotionally Abused:    Physically Abused:    Sexually Abused:      BP 136/84    Pulse 73    Ht 6\' 2"  (1.88 m)    Wt 175 lb 9.6 oz (79.7 kg)    SpO2 95%    BMI 22.55 kg/m   Physical Exam:  Well appearing NAD HEENT: Unremarkable Neck:  No JVD, no thyromegally Lymphatics:  No adenopathy Back:  No CVA tenderness Lungs:  Clear with no wheezes HEART:  Regular rate rhythm, no murmurs, no rubs, no clicks Abd:  soft, positive bowel sounds, no organomegally, no rebound, no guarding Ext:  2 plus pulses, no edema, no cyanosis, no clubbing Skin:  No rashes no nodules Neuro:  CN II through XII intact, motor grossly intact  EKG - nsr with no pre-excitation  Assess/Plan: 1. SVT - he is s/p ablation of a concealed right posteroseptal AP. He has had no recurrent SVT. I will see him back as needed.  2. HTN - his bp is well controlled. No change in meds. He will continue cardizem.  .D.

## 2019-10-17 NOTE — Patient Instructions (Addendum)
Medication Instructions:  Your physician recommends that you continue on your current medications as directed. Please refer to the Current Medication list given to you today.  Labwork: None ordered.  Testing/Procedures: None ordered.  Follow-Up: Your physician wants you to follow-up in: as needed with Dr. Taylor.      Any Other Special Instructions Will Be Listed Below (If Applicable).  If you need a refill on your cardiac medications before your next appointment, please call your pharmacy.   

## 2019-10-18 LAB — IGE: IgE (Immunoglobulin E), Serum: 4712 kU/L — ABNORMAL HIGH (ref <=114)

## 2019-10-24 NOTE — Progress Notes (Signed)
Spoke with patient and provided test results.  Verbalized understanding.

## 2019-10-26 DIAGNOSIS — Z23 Encounter for immunization: Secondary | ICD-10-CM | POA: Diagnosis not present

## 2019-11-09 ENCOUNTER — Encounter (HOSPITAL_COMMUNITY)
Admission: RE | Admit: 2019-11-09 | Discharge: 2019-11-09 | Disposition: A | Discharge status: Home / Self Care | Payer: BC Managed Care – PPO | Source: Ambulatory Visit | Attending: Internal Medicine | Admitting: Internal Medicine

## 2019-11-09 ENCOUNTER — Other Ambulatory Visit: Payer: Self-pay

## 2019-11-09 ENCOUNTER — Encounter (HOSPITAL_COMMUNITY): Payer: Self-pay

## 2019-11-09 DIAGNOSIS — J4550 Severe persistent asthma, uncomplicated: Secondary | ICD-10-CM | POA: Insufficient documentation

## 2019-11-09 MED ORDER — SODIUM CHLORIDE 0.9 % IV SOLN: Freq: Once | INTRAVENOUS | Status: AC

## 2019-11-09 MED ORDER — SODIUM CHLORIDE 0.9 % IV SOLN
3.0000 mg/kg | Freq: Once | INTRAVENOUS | Status: AC
  Administered 2019-11-09: 240 mg via INTRAVENOUS
  Filled 2019-11-09: qty 24

## 2019-11-12 ENCOUNTER — Ambulatory Visit
Admission: RE | Admit: 2019-11-12 | Discharge: 2019-11-12 | Disposition: A | Discharge status: Home / Self Care | Payer: BC Managed Care – PPO | Source: Ambulatory Visit | Attending: Emergency Medicine | Admitting: Emergency Medicine

## 2019-11-12 ENCOUNTER — Other Ambulatory Visit: Payer: Self-pay

## 2019-11-12 VITALS — BP 147/97 | HR 79 | Temp 98.6°F | Resp 19 | Ht 74.0 in | Wt 178.0 lb

## 2019-11-12 DIAGNOSIS — J4521 Mild intermittent asthma with (acute) exacerbation: Secondary | ICD-10-CM

## 2019-11-12 MED ORDER — DEXAMETHASONE SODIUM PHOSPHATE 10 MG/ML IJ SOLN
10.0000 mg | Freq: Once | INTRAMUSCULAR | Status: AC
  Administered 2019-11-12: 10 mg via INTRAMUSCULAR

## 2019-11-12 MED ORDER — PREDNISONE 20 MG PO TABS: 20.0000 mg | ORAL_TABLET | Freq: Two times a day (BID) | ORAL | 0 refills | Status: AC

## 2019-11-12 NOTE — ED Triage Notes (Addendum)
Pt reports his asthma has been flaring up since Saturday. States he has used neb tx and rescue inhaler with no relief. Pt states he feels like he needs a steroid.

## 2019-11-12 NOTE — Discharge Instructions (Signed)
Decadron shot given in office Prednisone prescribed.   Take steroid as prescribed and to completion Follow up with PCP next week Return here or go to ER if you have any new or worsening symptoms such as shortness of breath, difficulty breathing, accessory muscle use, rib retraction, or if symptoms do not improve with medication

## 2019-11-12 NOTE — ED Provider Notes (Signed)
[facility]   [id number] 11/12/19 Arrival Time: 1441   YB:[id number]  SUBJECTIVE: History from: patient.  Drew Sandoval is a 44 y.o. male who presents with complaint of intermittent non-productive cough and wheezing. Triggers: change in weather and eating. Onset gradual, approximately 2 days ago. Describes wheezing as mild when present. Fever: no. Overall normal PO intake without n/v. Sick contacts: no.  Typically his asthma is well controlled. Denies fever, chills, nausea, vomiting, SOB, chest pain, abdominal pain, changes in bowel or bladder function.  Has tried rescue inhaler and breathing treatment without relief.     ROS: As per HPI.  All other pertinent ROS negative.    Past Medical History:  Diagnosis Date  . Asthma   . Seasonal allergies    Past Surgical History:  Procedure Laterality Date  . None    . SVT ABLATION N/A 09/07/2019   Procedure: SVT ABLATION;  Surgeon: Marinus Maw, MD;  Location: [facility];  Service: Cardiovascular;  Laterality: N/A;   Allergies  Allergen Reactions  . Azithromycin Rash  . Alvesco [Ciclesonide]     Possible reaction- rash  . Anoro Ellipta [Umeclidinium-Vilanterol]     Possible- rash  . Banana Swelling    Throat swelling   . Methylprednisolone Other (See Comments)    unknown  . Penicillins Rash    Has patient had a PCN reaction causing immediate rash, facial/tongue/throat swelling, SOB or lightheadedness with hypotension: Yes Has patient had a PCN reaction causing severe rash involving mucus membranes or skin necrosis: No Has patient had a PCN reaction that required hospitalization Yes Has patient had a PCN reaction occurring within the last 10 years: No If all of the above answers are "NO", then may proceed with Cephalosporin use.     No current facility-administered medications on file prior to encounter.   Current Outpatient Medications on File Prior to Encounter  Medication Sig Dispense Refill  . albuterol  (VENTOLIN HFA) 108 (90 Base) MCG/ACT inhaler Inhale 1-2 puffs into the lungs every 6 (six) hours as needed for wheezing or shortness of breath.    . Budeson-Glycopyrrol-Formoterol (BREZTRI AEROSPHERE) 160-9-4.8 MCG/ACT AERO Inhale 2 puffs into the lungs 2 (two) times daily. Rinse mouth 10.7 g 5  . diltiazem (DILACOR XR) 120 MG 24 hr capsule Take 120 mg by mouth daily.    Marland Kitchen EPINEPHrine 0.3 mg/0.3 mL IJ SOAJ injection Inject into thigh for severe allergic reaction 1 Device prn  . levalbuterol (XOPENEX) 0.31 MG/3ML nebulizer solution Take 3 mLs (0.31 mg total) by nebulization every 4 (four) hours as needed for wheezing. 75 mL 12  . montelukast (SINGULAIR) 10 MG tablet TAKE 1 TABLET BY MOUTH EVERYDAY AT BEDTIME 90 tablet 3  . primidone (MYSOLINE) 50 MG tablet 2 in the AM, 1 at night 270 tablet 1  . Reslizumab (CINQAIR IV) Inject 228.3 mg into the vein every 30 (thirty) days.     Social History   Socioeconomic History  . Marital status: Single    Spouse name: Not on file  . Number of children: Not on file  . Years of education: Not on file  . Highest education level: Not on file  Occupational History  . Occupation: Psychologist, occupational: [employer]  Tobacco Use  . Smoking status: Former Smoker    Packs/day: 0.50    Years: 11.50    Pack years: 5.75    Types: Cigarettes    Quit date: 04/09/2010    Years since quitting:  9.6  . Smokeless tobacco: Former Neurosurgeon  . Tobacco comment: started at age 41.  1/2 ppd.    Vaping Use  . Vaping Use: Never used  Substance and Sexual Activity  . Alcohol use: Yes    Comment: rarely  . Drug use: No  . Sexual activity: Yes    Birth control/protection: None  Other Topics Concern  . Not on file  Social History Narrative   Left handed   Social Determinants of Health   Financial Resource Strain:   . Difficulty of Paying Living Expenses:   Food Insecurity:   . Worried About Programme researcher, broadcasting/film/video in the Last Year:   . Barista in the Last Year:    Transportation Needs:   . Freight forwarder (Medical):   Marland Kitchen Lack of Transportation (Non-Medical):   Physical Activity:   . Days of Exercise per Week:   . Minutes of Exercise per Session:   Stress:   . Feeling of Stress :   Social Connections:   . Frequency of Communication with Friends and Family:   . Frequency of Social Gatherings with Friends and Family:   . Attends Religious Services:   . Active Member of Clubs or Organizations:   . Attends Banker Meetings:   Marland Kitchen Marital Status:   Intimate Partner Violence:   . Fear of Current or Ex-Partner:   . Emotionally Abused:   Marland Kitchen Physically Abused:   . Sexually Abused:    Family History  Problem Relation Age of Onset  . Emphysema Father   . Lung cancer Mother   . Arthritis Sister     OBJECTIVE:  Vitals:   11/12/19 1454 11/12/19 1455  BP:  (!) 147/97  Pulse:  79  Resp:  19  Temp:  98.6 F (37 C)  TempSrc:  Oral  SpO2:  92%  Weight: 178 lb (80.7 kg)   Height: 6\' 2"  (1.88 m)     General appearance: alert; well-appearing HEENT: NCAT; EACs clear, TMs pearly gray; nasal congestion; nares patent; throat irritation secondary to post-nasal drainage Neck: supple without LAD Lungs: unlabored respirations, mild bilateral wheezing; cough: mild; no significant respiratory distress Skin: warm and dry Psychological: alert and cooperative; normal mood and affect  ASSESSMENT & PLAN:  1. Mild intermittent asthma with acute exacerbation     Meds ordered this encounter  Medications  . predniSONE (DELTASONE) 20 MG tablet    Sig: Take 1 tablet (20 mg total) by mouth 2 (two) times daily with a meal for 5 days.    Dispense:  10 tablet    Refill:  0    Order Specific Question:   Supervising Provider    Answer:   Eustace Moore  . dexamethasone (DECADRON) injection 10 mg   Decadron shot given in office Prednisone prescribed.   Take steroid as prescribed and to completion Follow up with PCP next  week Return here or go to ER if you have any new or worsening symptoms such as shortness of breath, difficulty breathing, accessory muscle use, rib retraction, or if symptoms do not improve with medication    Reviewed expectations re: course of current medical issues. Questions answered. Outlined signs and symptoms indicating need for more acute intervention. Patient verbalized understanding. After Visit Summary given.          [[id number]], PA-C 11/12/19 1542

## 2019-11-26 ENCOUNTER — Ambulatory Visit
Admission: EM | Admit: 2019-11-26 | Discharge: 2019-11-26 | Disposition: A | Discharge status: Home / Self Care | Payer: BC Managed Care – PPO | Attending: Emergency Medicine | Admitting: Emergency Medicine

## 2019-11-26 ENCOUNTER — Other Ambulatory Visit: Payer: Self-pay

## 2019-11-26 ENCOUNTER — Encounter: Payer: Self-pay | Admitting: Emergency Medicine

## 2019-11-26 DIAGNOSIS — R220 Localized swelling, mass and lump, head: Secondary | ICD-10-CM

## 2019-11-26 DIAGNOSIS — H66002 Acute suppurative otitis media without spontaneous rupture of ear drum, left ear: Secondary | ICD-10-CM | POA: Diagnosis not present

## 2019-11-26 MED ORDER — DOXYCYCLINE HYCLATE 100 MG PO CAPS
100.0000 mg | ORAL_CAPSULE | Freq: Two times a day (BID) | ORAL | 0 refills | Status: DC
Start: 2019-11-26 — End: 2019-12-19

## 2019-11-26 MED ORDER — DEXAMETHASONE SODIUM PHOSPHATE 10 MG/ML IJ SOLN
10.0000 mg | Freq: Once | INTRAMUSCULAR | Status: AC
  Administered 2019-11-26: 10 mg via INTRAMUSCULAR

## 2019-11-26 NOTE — ED Provider Notes (Signed)
[facility]   [id number] 11/26/19 Arrival Time: 1225  CC: EAR PAIN  SUBJECTIVE: History from: patient.  Winford Hehn is a 44 y.o. male who presents with of LT ear pain x 1 week.  Denies a precipitating event, or sick contacts.  Patient states the pain is constant and 8/10.  Patient has tried OTC medications without relief.  Symptoms are made worse to the touch.  Denies similar symptoms in the past.  Complains of associated facial swelling and dental pain.  Denies fever, chills, fatigue, sinus pain, rhinorrhea, ear discharge, sore throat, SOB, wheezing, chest pain, nausea, changes in bowel or bladder habits.    ROS: As per HPI.  All other pertinent ROS negative.     Past Medical History:  Diagnosis Date  . Asthma   . Seasonal allergies    Past Surgical History:  Procedure Laterality Date  . None    . SVT ABLATION N/A 09/07/2019   Procedure: SVT ABLATION;  Surgeon: Marinus Maw, MD;  Location: [facility] IN[facility]AS[facility];  Service: Cardiovascular;  Laterality: N/A;   Allergies  Allergen Reactions  . Azithromycin Rash  . Alvesco [Ciclesonide]     Possible reaction- rash  . Anoro Ellipta [Umeclidinium-Vilanterol]     Possible- rash  . Banana Swelling    Throat swelling   . Methylprednisolone Other (See Comments)    unknown  . Penicillins Rash    Has patient had a PCN reaction causing immediate rash, facial/tongue/throat swelling, SOB or lightheadedness with hypotension: Yes Has patient had a PCN reaction causing severe rash involving mucus membranes or skin necrosis: No Has patient had a PCN reaction that required hospitalization Yes Has patient had a PCN reaction occurring within the last 10 years: No If all of the above answers are "NO", then may proceed with Cephalosporin use.     No current facility-administered medications on file prior to encounter.   Current Outpatient Medications on File Prior to Encounter  Medication Sig Dispense Refill  . albuterol  (VENTOLIN HFA) 108 (90 Base) MCG/ACT inhaler Inhale 1-2 puffs into the lungs every 6 (six) hours as needed for wheezing or shortness of breath.    . Budeson-Glycopyrrol-Formoterol (BREZTRI AEROSPHERE) 160-9-4.8 MCG/ACT AERO Inhale 2 puffs into the lungs 2 (two) times daily. Rinse mouth 10.7 g 5  . diltiazem (DILACOR XR) 120 MG 24 hr capsule Take 120 mg by mouth daily.    Marland Kitchen EPINEPHrine 0.3 mg/0.3 mL IJ SOAJ injection Inject into thigh for severe allergic reaction 1 Device prn  . levalbuterol (XOPENEX) 0.31 MG/3ML nebulizer solution Take 3 mLs (0.31 mg total) by nebulization every 4 (four) hours as needed for wheezing. 75 mL 12  . montelukast (SINGULAIR) 10 MG tablet TAKE 1 TABLET BY MOUTH EVERYDAY AT BEDTIME 90 tablet 3  . primidone (MYSOLINE) 50 MG tablet 2 in the AM, 1 at night 270 tablet 1  . Reslizumab (CINQAIR IV) Inject 228.3 mg into the vein every 30 (thirty) days.     Social History   Socioeconomic History  . Marital status: Single    Spouse name: Not on file  . Number of children: Not on file  . Years of education: Not on file  . Highest education level: Not on file  Occupational History  . Occupation: Psychologist, occupational: [employer]  Tobacco Use  . Smoking status: Former Smoker    Packs/day: 0.50    Years: 11.50    Pack years: 5.75    Types: Cigarettes  Quit date: 04/09/2010    Years since quitting: 9.6  . Smokeless tobacco: Former Neurosurgeon  . Tobacco comment: started at age 27.  1/2 ppd.    Vaping Use  . Vaping Use: Never used  Substance and Sexual Activity  . Alcohol use: Yes    Comment: rarely  . Drug use: No  . Sexual activity: Yes    Birth control/protection: None  Other Topics Concern  . Not on file  Social History Narrative   Left handed   Social Determinants of Health   Financial Resource Strain:   . Difficulty of Paying Living Expenses:   Food Insecurity:   . Worried About Programme researcher, broadcasting/film/video in the Last Year:   . Barista in the Last Year:    Transportation Needs:   . Freight forwarder (Medical):   Marland Kitchen Lack of Transportation (Non-Medical):   Physical Activity:   . Days of Exercise per Week:   . Minutes of Exercise per Session:   Stress:   . Feeling of Stress :   Social Connections:   . Frequency of Communication with Friends and Family:   . Frequency of Social Gatherings with Friends and Family:   . Attends Religious Services:   . Active Member of Clubs or Organizations:   . Attends Banker Meetings:   Marland Kitchen Marital Status:   Intimate Partner Violence:   . Fear of Current or Ex-Partner:   . Emotionally Abused:   Marland Kitchen Physically Abused:   . Sexually Abused:    Family History  Problem Relation Age of Onset  . Emphysema Father   . Lung cancer Mother   . Arthritis Sister     OBJECTIVE:  Vitals:   11/26/19 1244 11/26/19 1245  BP: 113/64   Pulse: 76   Resp: 18   Temp: 99.2 F (37.3 C)   TempSrc: Oral   SpO2: 92%   Weight:  175 lb (79.4 kg)    General appearance: alert; well-appearing, nontoxic; speaking in full sentences and tolerating own secretions HEENT: NCAT; LT sided facial swelling, TTP, no overlying erythema; Ears: EACs clear, RT TM pearly gray, LT TM erythematous; Eyes: PERRL.  EOM grossly intact.Nose: nares patent without rhinorrhea, Throat: oropharynx clear, tonsils non erythematous or enlarged, uvula midline  Neck: supple without LAD Lungs: unlabored respirations, symmetrical air entry; cough: absent; no respiratory distress; CTAB Heart: regular rate and rhythm.  Skin: warm and dry Psychological: alert and cooperative; normal mood and affect   ASSESSMENT & PLAN:  1. Non-recurrent acute suppurative otitis media of left ear without spontaneous rupture of tympanic membrane   2. Left facial swelling     Meds ordered this encounter  Medications  . doxycycline (VIBRAMYCIN) 100 MG capsule    Sig: Take 1 capsule (100 mg total) by mouth 2 (two) times daily.    Dispense:  20 capsule     Refill:  0    Order Specific Question:   Supervising Provider    Answer:   Eustace Moore [[id number]]  . dexamethasone (DECADRON) injection 10 mg   Steroid shot given in office Rest and drink plenty of fluids Doxycycline prescribed.  Take as directed Continue to use OTC ibuprofen and/ or tylenol as needed for pain control Go to the ED if not seeing improvement in the next 24 hours Go to the ER if you have any new or worsening symptoms fever, chills, nausea, vomiting, redness, worsening swelling, worsening ear pain, etc...  Reviewed expectations re:  course of current medical issues. Questions answered. Outlined signs and symptoms indicating need for more acute intervention. Patient verbalized understanding. After Visit Summary given.         Rennis Harding, PA-C 11/26/19 1321

## 2019-11-26 NOTE — Discharge Instructions (Signed)
Steroid shot and rocephin shot given in office Rest and drink plenty of fluids Augmentin prescribed.  Take as directed Continue to use OTC ibuprofen and/ or tylenol as needed for pain control Go to the ED if not seeing improvement in the next 24 hours Go to the ER if you have any new or worsening symptoms fever, chills, nausea, vomiting, redness, worsening swelling, worsening ear pain, etc..Marland Kitchen

## 2019-11-26 NOTE — ED Triage Notes (Signed)
Last Monday knot on left side of neck came up.  It is getting bigger.   No sore throat.

## 2019-12-07 ENCOUNTER — Other Ambulatory Visit: Payer: Self-pay

## 2019-12-07 ENCOUNTER — Encounter (HOSPITAL_COMMUNITY): Payer: Self-pay

## 2019-12-07 ENCOUNTER — Encounter (HOSPITAL_COMMUNITY)
Admission: RE | Admit: 2019-12-07 | Discharge: 2019-12-07 | Disposition: A | Discharge status: Home / Self Care | Payer: BC Managed Care – PPO | Source: Ambulatory Visit | Attending: Internal Medicine | Admitting: Internal Medicine

## 2019-12-07 DIAGNOSIS — J4550 Severe persistent asthma, uncomplicated: Secondary | ICD-10-CM | POA: Diagnosis not present

## 2019-12-07 MED ORDER — SODIUM CHLORIDE 0.9 % IV SOLN: Freq: Once | INTRAVENOUS | Status: AC

## 2019-12-07 MED ORDER — SODIUM CHLORIDE 0.9 % IV SOLN
230.0000 mg | Freq: Once | INTRAVENOUS | Status: AC
  Administered 2019-12-07: 230 mg via INTRAVENOUS
  Filled 2019-12-07: qty 23

## 2019-12-19 ENCOUNTER — Other Ambulatory Visit: Payer: Self-pay

## 2019-12-19 ENCOUNTER — Encounter: Payer: Self-pay | Admitting: Internal Medicine

## 2019-12-19 ENCOUNTER — Ambulatory Visit: Payer: BC Managed Care – PPO | Admitting: Internal Medicine

## 2019-12-19 VITALS — BP 118/68 | HR 53 | Temp 98.0°F | Ht 74.0 in | Wt 171.8 lb

## 2019-12-19 DIAGNOSIS — J45909 Unspecified asthma, uncomplicated: Secondary | ICD-10-CM

## 2019-12-19 DIAGNOSIS — D824 Hyperimmunoglobulin E [IgE] syndrome: Secondary | ICD-10-CM | POA: Diagnosis not present

## 2019-12-19 DIAGNOSIS — J4551 Severe persistent asthma with (acute) exacerbation: Secondary | ICD-10-CM

## 2019-12-19 MED ORDER — PREDNISONE 10 MG PO TABS
ORAL_TABLET | ORAL | 0 refills | Status: DC
Start: 2019-12-19 — End: 2020-05-21

## 2019-12-19 NOTE — Patient Instructions (Addendum)
Order- schedule PFT   Dx Asthma moderate persistent  Order- injection team- changing from Cinqair to self-injection Dupixent             Script printed for prednisone taper to hold in case needed

## 2019-12-19 NOTE — Assessment & Plan Note (Signed)
Persistent elevations of IgE, eos.  Plan- changing from Cinqair to Dupixent, seeking better asthma control

## 2019-12-19 NOTE — Progress Notes (Signed)
Patient ID: Drew Sandoval, male    DOB: [birth year]    MRN: [id number]  HPI M  former smoker followed for severe asthma/ COPD, chronic sinusitis, hyper-eosinophilia and hyper IgE, complicated by history SVT/cardioversion FENO 03/18/16- 100  High, indicates allergic asthma Office Spirometry 03/18/2016-severe obstructive airways disease FVC 2.62/51%, FEV1 1.30/31%, FEV1/FVC 0.50, FEF 25-75 0.61/14% Office Spirometry 01/25/17-severe obstructive airways disease. FVC 3.52/68%, FEV1 1.80/43%, ratio or 0.51, FEF 25-75% 0.76/18% IgE was too high for Xolair. He could not afford Nucala or allergy vaccine. Cinqair infusion@ [facility]- ordered 07/21/17 ----------------------------------------------------  10/16/19- 44 year old male former smoker followed for severe asthma/ COPD/ Cinqair, chronic sinusitis, persistent eosinophilia, hyper IgE-too high for Xolair, history SVT/cardioversion, Kidney stone Breztri, Cinqair IV, Singulair, Ventolin hfa, neb albuterol -----no complaints  Had SVT ablation 09/07/19 Describes asthma exacerbation over past month, esp with exertion, blaming "heat and allergies". No recent prednisone, but using his neb, Breztri, singulair, albuterol hfa. Girlfirend tells him he wheezes in sleep. No obvious infection. Most recent Cinqair infusion last week.  Feels well today while inactive indoors.  CXR 1V 07/28/19-  FINDINGS: Mild, stable increased lung markings are seen without evidence of acute infiltrate, pleural effusion or pneumothorax. The heart size and mediastinal contours are within normal limits. The visualized skeletal structures are unremarkable. IMPRESSION: No active disease.  12/19/19- 44 year old male former smoker followed for severe asthma/ COPD/ Cinqair, chronic sinusitis, persistent eosinophilia, hyper IgE-too high for Xolair, history SVT/cardioversion/ ablation, Kidney stone Breztri, Cinqair IV, Singulair, Ventolin hfa, neb albuterol        2 [facility] ED visit  11/12/19 for cough and wheeze/ Asthma treated prednisone/ decadron      ED visit 7/19 for otitis media ------pt is sob when doing activities Asthma flare onset 2 days ago, described as DOE while at work, chest tightness but not wheezing. No cardiac problems since ablation. No edema, leg or chest pain, fever. Finished prednisone about a week after 7/19 ED visit.  Was using rescue inhaler 3-4x/ day last 2 days, but better today. Arrival O2 sat today 99% on RA.  ROS-see HPI  "+" = positive Constitutional:    weight loss, night sweats, fevers, chills, fatigue, lassitude. HEENT:    headaches, difficulty swallowing, tooth/dental problems, sore throat,       sneezing, itching, ear ache, +nasal congestion, +post nasal drip, snoring CV:    chest pain, orthopnea, PND, swelling in lower extremities, anasarca,                                                      dizziness, palpitations Resp:   +shortness of breath with exertion or at rest.                productive cough,  non-productive cough, coughing up of blood.              change in color of mucus.  +wheezing.   Skin:    rash or lesions. GI:  No-   heartburn, indigestion, abdominal pain, nausea, vomiting,  GU: d. MS:   joint pain, stiffness, decreased range of motion, back pain. Neuro-     nothing unusual Psych:  change in mood or affect.  depression or anxiety.   memory loss.  Objective:   Physical Exam General- Alert, Oriented, Affect-reserved/ laconic, Distress-none acute;  Tall, thin Skin- +tatoos  Lymphadenopathy- none Head- atraumatic            Eyes- Gross vision intact, PERRLA, conjunctivae clear secretions            Ears- Hearing, canals normal            Nose- Clear, no-Septal dev, mucus, polyps, erosion, perforation             Throat- Mallampati II , mucosa clear , drainage- none, tonsils- atrophic Neck- flexible , trachea midline, no stridor , thyroid nl, carotid no bruit Chest - symmetrical excursion , unlabored            Heart/CV- RRR  , no murmur , no gallop  , no rub, nl s1 s2                           - JVD- none , edema- none, stasis changes- none, varices- none           Lung-   +Diminished/ clear, unlabored,  Cough-none, dullness-none, rub- none,  Wheeze-none Abd- scaphoid  Br/ Gen/ Rectal- Not done, not indicated Extrem- cyanosis- none, clubbing, none, atrophy- none, strength- nl Neuro- unremarkable

## 2019-12-19 NOTE — Assessment & Plan Note (Signed)
Recent ED visit for wheezing dyspnea which responded to steroids. Not wheezing yet, but it sounds as if coming off prednisone, he has been tighter. Plan- printed script for prednisone taper to hold. Change Cinqair to Dupixent- he can self inject at home one taught here. Schedule PFT

## 2019-12-20 ENCOUNTER — Telehealth: Payer: Self-pay | Admitting: Pharmacy Technician

## 2019-12-20 NOTE — Telephone Encounter (Signed)
Received message that provider is looking to switch patient to Dupixent for home injections from Cinqair. Awaiting new start paperwork.  Submitted a Prior Authorization request to [employer] for DUPIXENT via Cover My Meds. Will update once we receive a response.    (Key: [id number])

## 2019-12-24 NOTE — Telephone Encounter (Signed)
Received notification from [facility] [employer] regarding a prior authorization for DUPIXENT. Authorization has been APPROVED from 12/21/19 to 06/22/20.   Authorization # [employer]

## 2019-12-25 NOTE — Telephone Encounter (Signed)
Ran test claim, patient's copay for 1 month is zero. No pharmacy restrictions shown on test claim.

## 2019-12-25 NOTE — Telephone Encounter (Signed)
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

## 2019-12-25 NOTE — Telephone Encounter (Signed)
Patient scheduled for New start appointment 12/31/19.  Nothing further needed.   Verlin Fester, PharmD, [place], CPP Clinical Specialty Pharmacist (Rheumatology and Pulmonology)  12/25/2019 1:48 PM

## 2019-12-31 ENCOUNTER — Other Ambulatory Visit: Payer: Self-pay

## 2019-12-31 ENCOUNTER — Other Ambulatory Visit: Payer: Self-pay | Admitting: Pharmacist

## 2019-12-31 ENCOUNTER — Ambulatory Visit: Payer: BC Managed Care – PPO | Admitting: Pharmacist

## 2019-12-31 DIAGNOSIS — J4551 Severe persistent asthma with (acute) exacerbation: Secondary | ICD-10-CM

## 2019-12-31 DIAGNOSIS — Z7189 Other specified counseling: Secondary | ICD-10-CM

## 2019-12-31 MED ORDER — DUPIXENT 300 MG/2ML ~~LOC~~ SOAJ: 300.0000 mg | SUBCUTANEOUS | 1 refills | Status: DC

## 2019-12-31 MED ORDER — DUPIXENT 300 MG/2ML ~~LOC~~ SOAJ: 600.0000 mg | Freq: Once | SUBCUTANEOUS | 0 refills | Status: DC

## 2019-12-31 NOTE — Progress Notes (Signed)
HPI Patient presents today to [facility] to see pharmacy team for Dupixent injection training referred by Dr. Maple Hudson. Past medical history includes severe persistent asthma, sustained SVT, seasonal and perennial allergic rhinitis, former smoker, hyper IgE (unable to use Xolair).   Number of hospitalizations in past year: 4 Number of asthma exacerbations in past year: 3  Most recent ER visit at urgent care on 11/12/2019 for mild intermittent asthma with acute exacerbation. Compliants of intermittent non-productive cough and wheezing with no relief from rescue inhaler and breathing treatment. Triggers were change in weather and eating. Given Decadron injection and discharged on prednisone taper for 5 days.   Last office with Dr. Maple Hudson on 12/19/2019. Patient reported an asthma flare while at work 2 days prior to visit. Experienced chest tightness with no wheezing or chest pain. Was using rescue inhaler 3-4x/day for the last 2 days. Plan was to discontinue Reslizumab (Cinqair) IV and start Dupixent for better asthma control  Today, patient presents in good spirits. Reports an asthma flare yesterday and took 40 mg of prednisone and felt much better.   Respiratory Medications Current: Albuterol PRN, Breztri BID, Xopenex PRN, Singulair daily, Reslizumab IV every 30 days. Tried in past: Symbicort, Alvesco (rash), Dulera, Breo Ellipta, Anoro Ellipta (rash), Trellegy Patient reports no known adherence challenges  OBJECTIVE Allergies  Allergen Reactions  . Azithromycin Rash  . Alvesco [Ciclesonide]     Possible reaction- rash  . Anoro Ellipta [Umeclidinium-Vilanterol]     Possible- rash  . Banana Swelling    Throat swelling   . Methylprednisolone Other (See Comments)    unknown  . Penicillins Rash    Has patient had a PCN reaction causing immediate rash, facial/tongue/throat swelling, SOB or lightheadedness with hypotension: Yes Has patient had a PCN reaction causing severe rash involving  mucus membranes or skin necrosis: No Has patient had a PCN reaction that required hospitalization Yes Has patient had a PCN reaction occurring within the last 10 years: No If all of the above answers are "NO", then may proceed with Cephalosporin use.      Outpatient Encounter Medications as of 12/31/2019  Medication Sig  . albuterol (VENTOLIN HFA) 108 (90 Base) MCG/ACT inhaler Inhale 1-2 puffs into the lungs every 6 (six) hours as needed for wheezing or shortness of breath.  . Budeson-Glycopyrrol-Formoterol (BREZTRI AEROSPHERE) 160-9-4.8 MCG/ACT AERO Inhale 2 puffs into the lungs 2 (two) times daily. Rinse mouth  . diltiazem (DILACOR XR) 120 MG 24 hr capsule Take 120 mg by mouth daily.  Marland Kitchen EPINEPHrine 0.3 mg/0.3 mL IJ SOAJ injection Inject into thigh for severe allergic reaction  . levalbuterol (XOPENEX) 0.31 MG/3ML nebulizer solution Take 3 mLs (0.31 mg total) by nebulization every 4 (four) hours as needed for wheezing.  . montelukast (SINGULAIR) 10 MG tablet TAKE 1 TABLET BY MOUTH EVERYDAY AT BEDTIME  . predniSONE (DELTASONE) 10 MG tablet 4 X 2 DAYS, 3 X 2 DAYS, 2 X 2 DAYS, 1 X 2 DAYS  . primidone (MYSOLINE) 50 MG tablet 2 in the AM, 1 at night  . Reslizumab (CINQAIR IV) Inject 228.3 mg into the vein every 30 (thirty) days.   No facility-administered encounter medications on file as of 12/31/2019.     Immunization History  Administered Date(s) Administered  . Influenza Split 02/18/2011, 02/03/2012  . Influenza,inj,Quad PF,6+ Mos 01/22/2013, 07/24/2013, 06/25/2015, 03/11/2016, 01/25/2017, 01/16/2019  . Influenza-Unspecified 02/11/2018  . PFIZER SARS-COV-2 Vaccination 10/04/2019, 10/26/2019  . Pneumococcal Polysaccharide-23 03/11/2016     PFTs No flowsheet  data found.   Eosinophils Most recent blood eosinophil count was 0.3 cells/microL taken on 10/16/19.   IgE Most recent IgE was 4,712 kU/L taken on 10/16/19  Assessment   1. Biologics training (Dupixent)  Counseled patient on  purpose, proper use, and adverse effects of Dupixent including injection site reactions and conjunctivitis. Discussed various management strategies for injection site reactions including the use of anti-histamines, cortisone cream, and cold compresses. Discussed Dupixent should be stored in the fridge and to remove medication from fridge at least 30 mins to 1 hour prior to injection to avoid stinging/burning.   Demonstrated proper injection technique withDupixentdemo pen. Patient able to demonstrate proper injection technique using the teach back method. Patient self-administered Dupixent loading dose of 600 mg in theupper right thighwith:  Sample Medication:Dupixent 300 mg/ml x 2 (loading dose) NDC:0024-5915-20 JJH:[id number] Expiration: 12-07-2020  Patient tolerated well.Observed for 30 mins in office for adverse reaction andnone noted.  Dupixent approved by insurance.Prescription will be sent to [facility]. First month copay should be $0, however if copay changes, patient eligible for copay savings card. Reviewed specialty pharmacy refill process and how to set up first shipment. Patient verbalized understanding.   2. Medication Reconciliation  A drug regimen assessment was performed, including review of allergies, interactions, disease-state management, dosing and immunization history. Medications were reviewed with the patient, including name, instructions, indication, goals of therapy, potential side effects, importance of adherence, and safe use.  Drug interaction(s): No major drug interactions noted.  3. Immunizations  Patient is indicated for the Prenvar-13 and influenzae vaccinations. Up-to-date on COVID and Pneumovax-23 vaccinations.   PLAN  Dupixent 600 mg (given as two 300 mg injections) - Loading dose given today in clinic  Then, start Dupixent 300 mg every 14 days  Sent prescription to [facility]  Consider  Prevnar-13 vaccine at next follow-up visit.  All questions encouraged and answered.  Instructed patient to reach out with any further questions or concerns.  Thank you for allowing pharmacy to participate in this patient's care.  This appointment required 60 minutes of patient care (this includes precharting, chart review, review of results, face-to-face care, etc.).  Fabio Neighbors, PharmD PGY2 Ambulatory Care Resident [facility]  Pharmacy

## 2019-12-31 NOTE — Patient Instructions (Addendum)
Nice meeting you today!  Below find a summary of what we discussed at your visit:  Start Dupixent 300 mg every 14 days  Prescription sent to [facility] (see below for address/phone#)  Remember the 5 C's: ? COUNTER- leave on the counter at least 30 mins but up to overnight to bring medication to room temperature and prevent stinging ? COLD- Placing something cold (like and ice gel pack or cold water bottle) on the injection site just before cleansing with alcohol may help reduce pain ? CLARITIN- for the first two weeks of treatment or  the day of, the day before, and the day after injecting to minimize injection site reactions ? CORTISONE CREAM- apply if injection site is irritated and itching ? CALL ME- if injection site reaction is bigger than the size of your fist, looks infected, blisters, or develop hives   [facility] [address] [place], [place] [address]  Phone:  [phone]   Call Pulmonary office [phone] with any questions or concerns.  Fabio Neighbors, PharmD PGY2 Ambulatory Care Resident [facility]  Pharmacy

## 2020-01-01 MED FILL — DUPIXENT 300 MG/2ML SOPN: 300 | 28 days supply | Qty: 4 | Fill #0

## 2020-01-03 ENCOUNTER — Other Ambulatory Visit: Payer: Self-pay | Admitting: Internal Medicine

## 2020-01-04 ENCOUNTER — Encounter (HOSPITAL_COMMUNITY)
Admission: RE | Admit: 2020-01-04 | Discharge: 2020-01-04 | Disposition: A | Discharge status: Home / Self Care | Payer: BC Managed Care – PPO | Source: Ambulatory Visit | Attending: Internal Medicine | Admitting: Internal Medicine

## 2020-01-30 MED FILL — DUPIXENT 300 MG/2ML SOPN: 300 | 28 days supply | Qty: 4 | Fill #1

## 2020-02-06 ENCOUNTER — Other Ambulatory Visit: Payer: Self-pay | Admitting: Internal Medicine

## 2020-02-11 ENCOUNTER — Other Ambulatory Visit: Payer: Self-pay | Admitting: Neurology

## 2020-02-11 NOTE — Progress Notes (Signed)
Assessment/Plan:    1.  Essential Tremor, exacerbated by medication for asthma  -Increase primidone, 50 mg, 2 po bid x 2 weeks and 3 in the AM and 2 at night thereafter  -understands that tremor likely will never be fully under control due to other medications that induce tremor  -discussed dbs although patient doesn't need that now.  He is not interessted  2.  Severe Asthma  -Medications for this contribute to tremor  3.  SVT  -Following with cardiology.  Subjective:   Drew Sandoval was seen today in follow up for essential tremor, which is much exacerbated by medication for his asthma.  My previous records were reviewed prior to todays visit. States that asthmas has been better this last month but tremor is bothersome.  Not spilling drinks but doesn't like to go out to eat because of tremor.  Pt denies falls.  Pt denies lightheadedness, near syncope.  No hallucinations.  Mood has been good.  Current prescribed movement disorder medications: Primidone, 50 mg, 2 tablets in the morning, 1 tablet at bed   PREVIOUS MEDICATIONS: primidone  ALLERGIES:   Allergies  Allergen Reactions  . Azithromycin Rash  . Alvesco [Ciclesonide]     Possible reaction- rash  . Anoro Ellipta [Umeclidinium-Vilanterol]     Possible- rash  . Banana Swelling    Throat swelling   . Methylprednisolone Other (See Comments)    unknown  . Penicillins Rash    Has patient had a PCN reaction causing immediate rash, facial/tongue/throat swelling, SOB or lightheadedness with hypotension: Yes Has patient had a PCN reaction causing severe rash involving mucus membranes or skin necrosis: No Has patient had a PCN reaction that required hospitalization Yes Has patient had a PCN reaction occurring within the last 10 years: No If all of the above answers are "NO", then may proceed with Cephalosporin use.      CURRENT MEDICATIONS:  Outpatient Encounter Medications as of 02/14/2020  Medication Sig  .  albuterol (VENTOLIN HFA) 108 (90 Base) MCG/ACT inhaler INHALE 2 PUFFS BY MOUTH EVERY 4 HOURS AS NEEDED  . Budeson-Glycopyrrol-Formoterol (BREZTRI AEROSPHERE) 160-9-4.8 MCG/ACT AERO Inhale 2 puffs into the lungs 2 (two) times daily. Rinse mouth  . Dupilumab (DUPIXENT) 300 MG/2ML SOPN Inject 300 mg into the skin every 14 (fourteen) days.  Marland Kitchen levalbuterol (XOPENEX) 0.31 MG/3ML nebulizer solution Take 3 mLs (0.31 mg total) by nebulization every 4 (four) hours as needed for wheezing.  . montelukast (SINGULAIR) 10 MG tablet TAKE 1 TABLET BY MOUTH EVERYDAY AT BEDTIME  . primidone (MYSOLINE) 50 MG tablet TAKE 2 TABLETS BY MOUTH IN THE MORNING AND TAKE 1 TABLET PO AT NIGHT  . EPINEPHrine 0.3 mg/0.3 mL IJ SOAJ injection Inject into thigh for severe allergic reaction (Patient not taking: Reported on 12/31/2019)  . predniSONE (DELTASONE) 10 MG tablet 4 X 2 DAYS, 3 X 2 DAYS, 2 X 2 DAYS, 1 X 2 DAYS (Patient not taking: Reported on 02/14/2020)  . [DISCONTINUED] primidone (MYSOLINE) 50 MG tablet 2 in the AM, 1 at night   No facility-administered encounter medications on file as of 02/14/2020.     Objective:    PHYSICAL EXAMINATION:    VITALS:   Vitals:   02/14/20 0809  BP: 133/83  Pulse: 72  Resp: 18  SpO2: 98%  Weight: 178 lb (80.7 kg)  Height: 6\' 2"  (1.88 m)    GEN:  The patient appears stated age and is in NAD. HEENT:  Normocephalic, atraumatic.  The  mucous membranes are moist. The superficial temporal arteries are without ropiness or tenderness. CV:  RRR Lungs:  CTAB Neck/HEME:  There are no carotid bruits bilaterally.  Neurological examination:  Orientation: The patient is alert and oriented x3. Cranial nerves: There is good facial symmetry. The speech is fluent and clear. Soft palate rises symmetrically and there is no tongue deviation. Hearing is intact to conversational tone. Sensation: Sensation is intact to light touch throughout Motor: Strength is at least antigravity x4.  Movement  examination: Tone: There is normal tone in the UE/LE Abnormal movements: L>R intention tremor.  he has mild difficulty with archimedes spirals. Coordination:  There is no decremation with RAM's Gait and Station: The patient has no difficulty arising out of a deep-seated chair without the use of the hands. The patient's stride length is good I have reviewed and interpreted the following labs independently   Chemistry      Component Value Date/Time   NA 140 09/04/2019 1357   K 4.4 09/07/2019 0854   CL 107 09/04/2019 1357   CO2 26 09/04/2019 1357   BUN 17 09/04/2019 1357   CREATININE 0.84 09/04/2019 1357      Component Value Date/Time   CALCIUM 9.1 09/04/2019 1357   ALKPHOS 56 06/30/2019 1348   AST 14 (L) 06/30/2019 1348   ALT 13 06/30/2019 1348   BILITOT 0.6 06/30/2019 1348      Lab Results  Component Value Date   WBC 8.3 10/16/2019   HGB 13.8 10/16/2019   HCT 41.4 10/16/2019   MCV 89.4 10/16/2019   PLT 224.0 10/16/2019   No results found for: TSH   Chemistry      Component Value Date/Time   NA 140 09/04/2019 1357   K 4.4 09/07/2019 0854   CL 107 09/04/2019 1357   CO2 26 09/04/2019 1357   BUN 17 09/04/2019 1357   CREATININE 0.84 09/04/2019 1357      Component Value Date/Time   CALCIUM 9.1 09/04/2019 1357   ALKPHOS 56 06/30/2019 1348   AST 14 (L) 06/30/2019 1348   ALT 13 06/30/2019 1348   BILITOT 0.6 06/30/2019 1348         Total time spent on today's visit was 20 minutes, including both face-to-face time and nonface-to-face time.  Time included that spent on review of records (prior notes available to me/labs/imaging if pertinent), discussing treatment and goals, answering patient's questions and coordinating care.  Cc:  Patient, No Pcp Per

## 2020-02-12 NOTE — Telephone Encounter (Signed)
Rx(s) sent to pharmacy electronically.  

## 2020-02-14 ENCOUNTER — Ambulatory Visit (INDEPENDENT_AMBULATORY_CARE_PROVIDER_SITE_OTHER): Payer: BC Managed Care – PPO | Admitting: Neurology

## 2020-02-14 ENCOUNTER — Other Ambulatory Visit: Payer: Self-pay

## 2020-02-14 ENCOUNTER — Encounter: Payer: Self-pay | Admitting: Neurology

## 2020-02-14 VITALS — BP 133/83 | HR 72 | Resp 18 | Ht 74.0 in | Wt 178.0 lb

## 2020-02-14 DIAGNOSIS — G250 Essential tremor: Secondary | ICD-10-CM

## 2020-02-14 MED ORDER — PRIMIDONE 50 MG PO TABS
ORAL_TABLET | ORAL | 1 refills | Status: DC
Start: 2020-02-14 — End: 2020-12-05

## 2020-02-14 NOTE — Patient Instructions (Signed)
1.  Take primidone 50 mg, 2 in the AM and 2 at night for 2 weeks and then 3 in the AM and 2 at night thereafter

## 2020-02-16 IMAGING — DX DG CHEST 2V
2 series · 2 of 2 positions shown · non-contrast
Comparison: 01/11/2018

CLINICAL DATA: Shortness of breath for 1 month

EXAM:
CHEST - 2 VIEW

[chest pa]
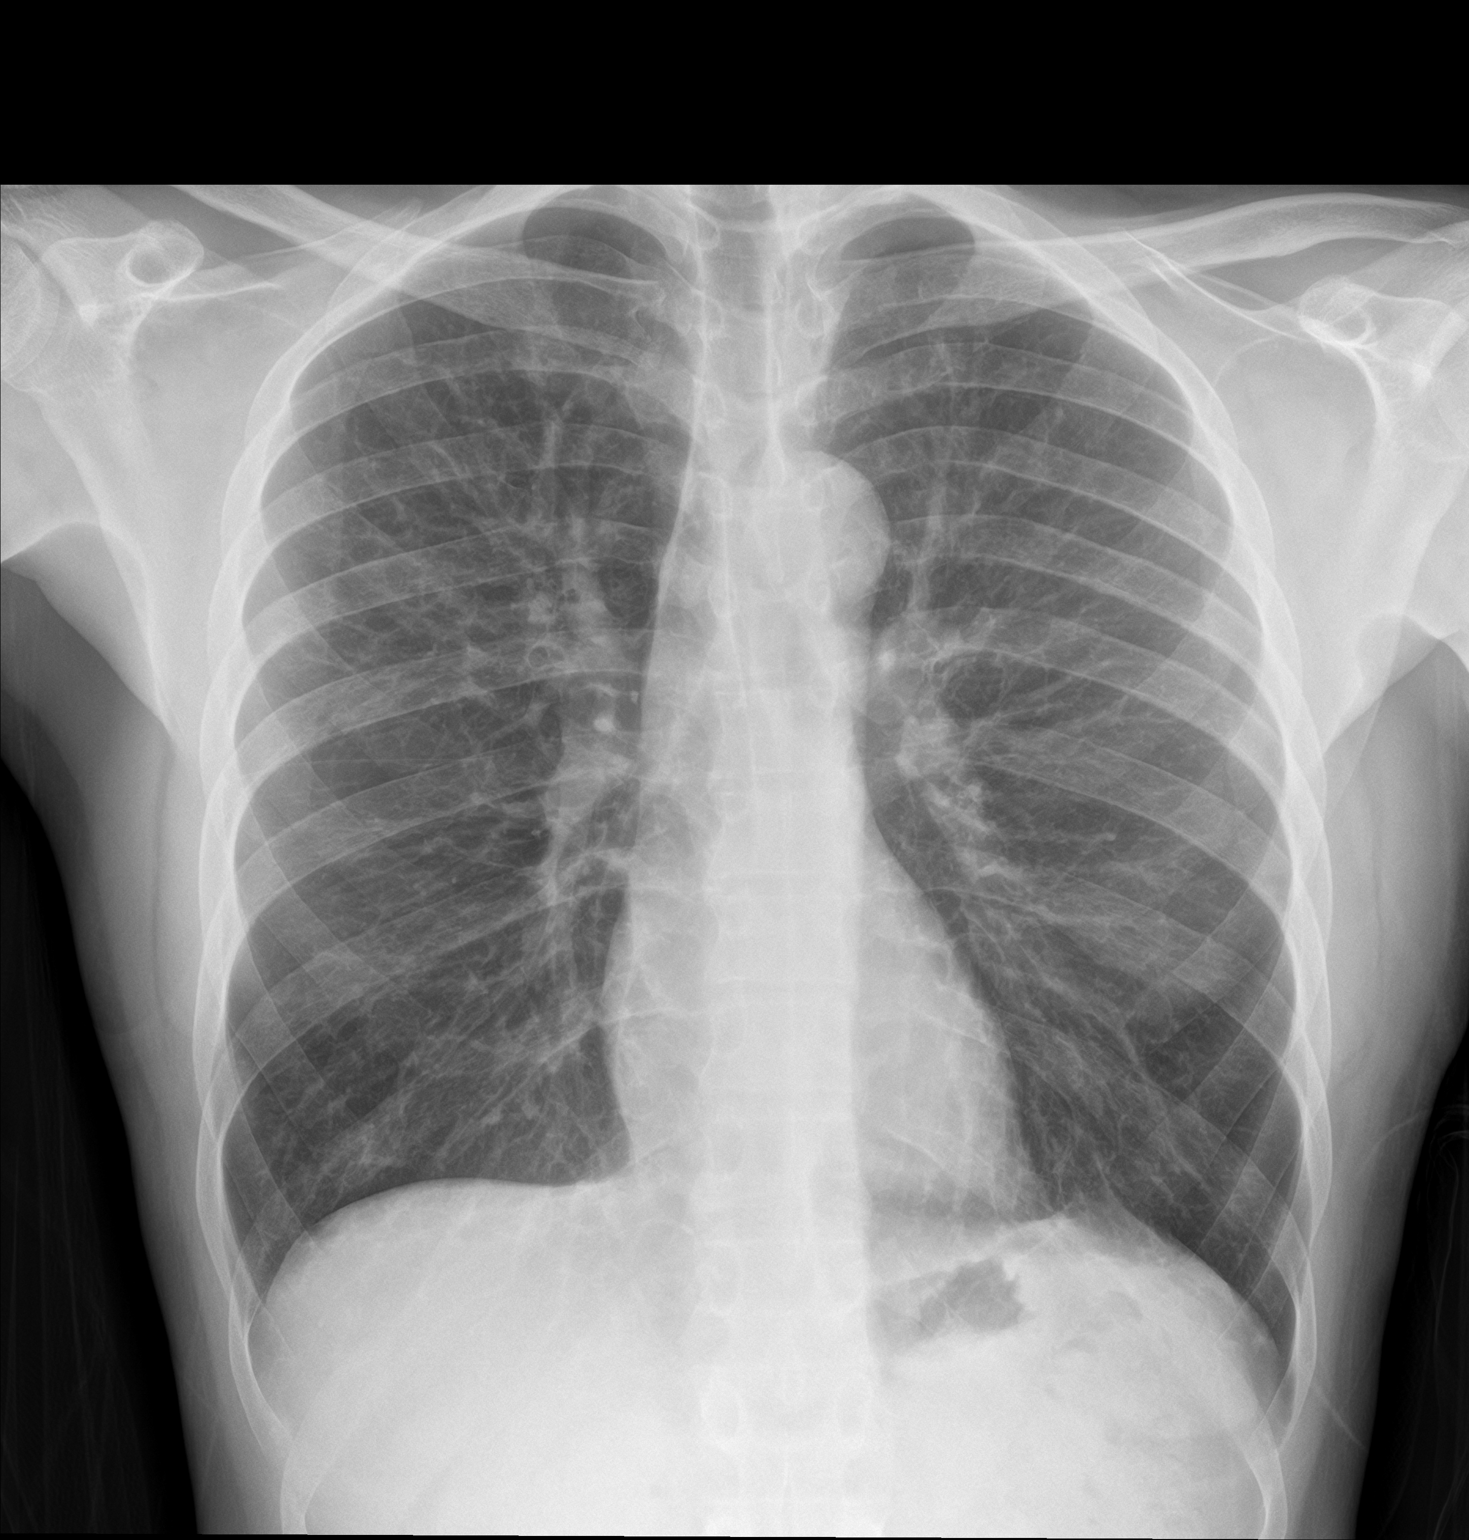

[chest lat]
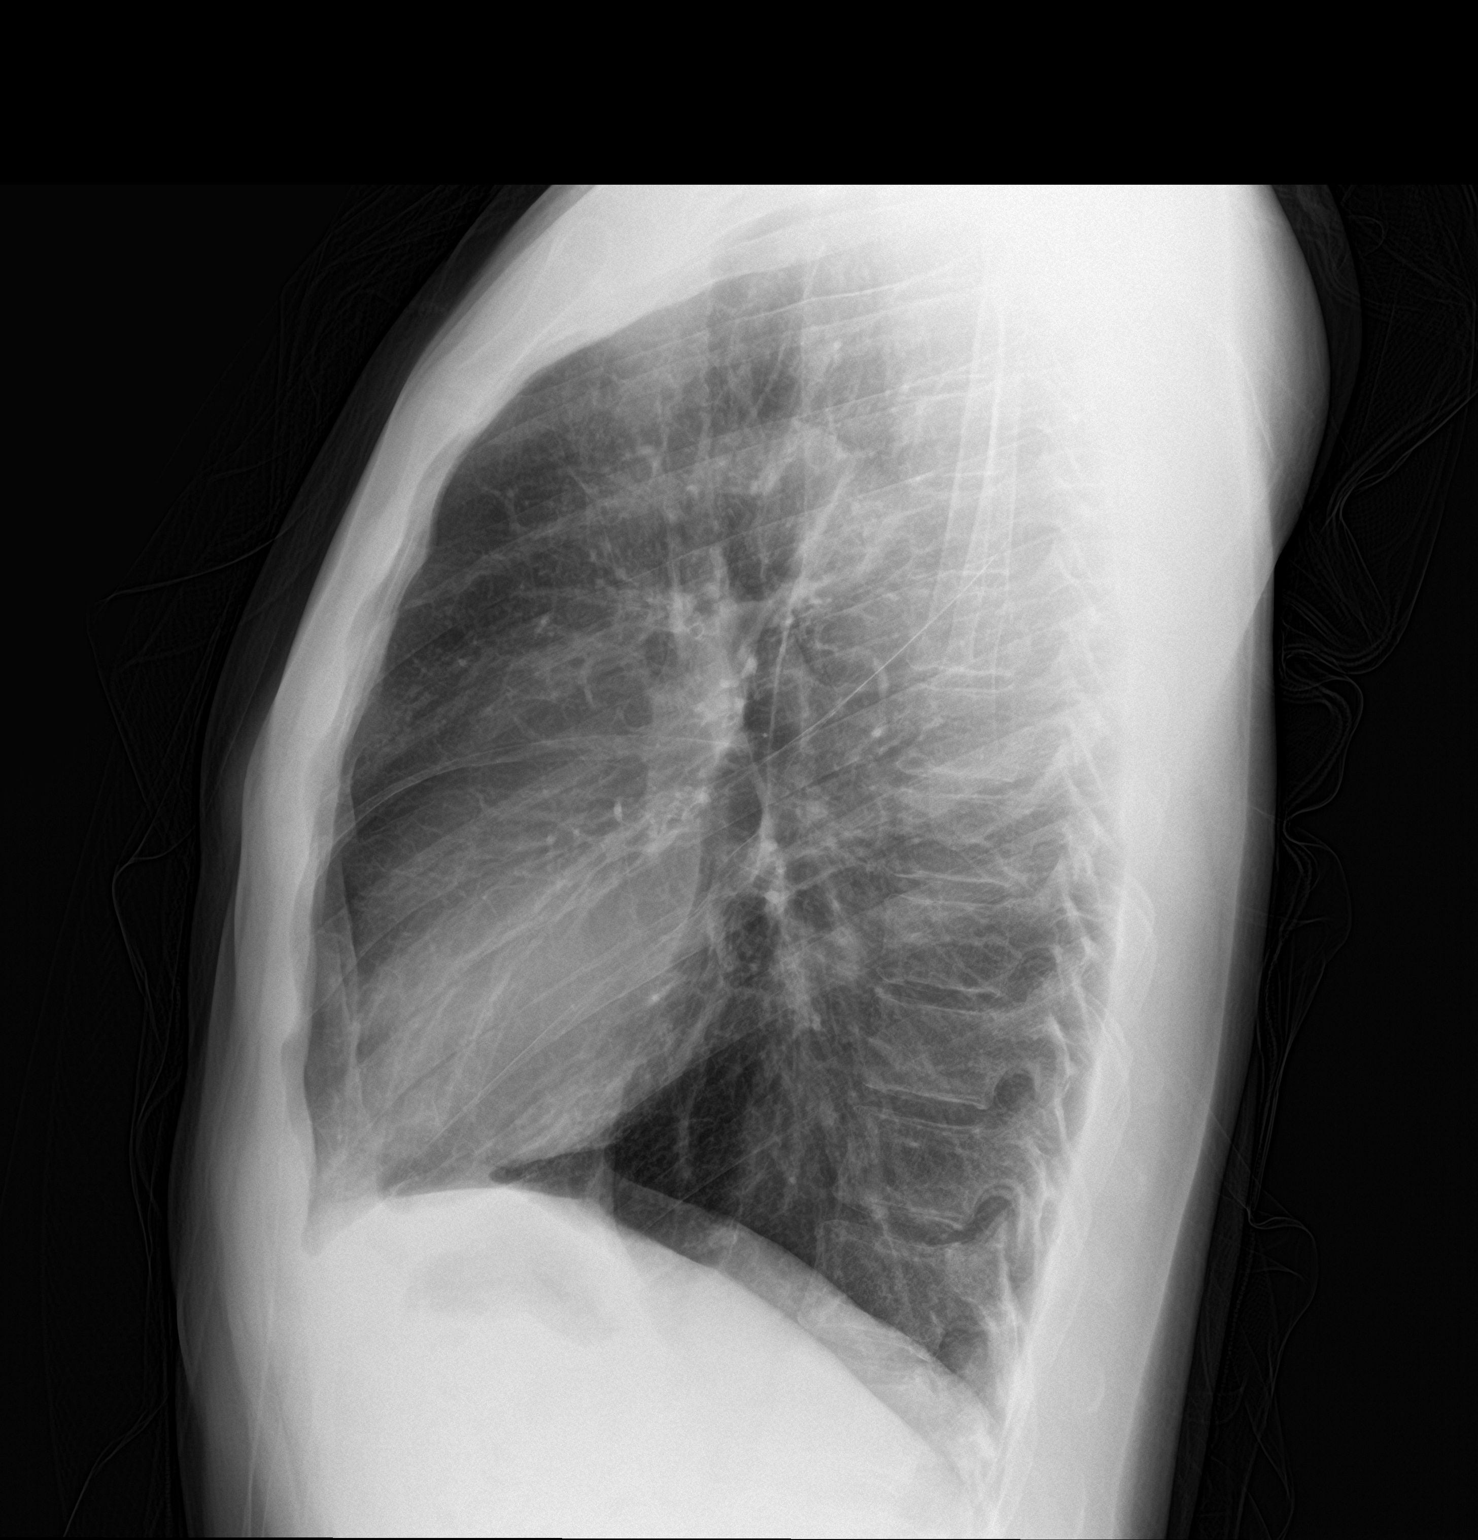

[2 of 2 positions shown; findings below may reference images not displayed]

FINDINGS: Cardiac shadows within normal limits. Mild aortic calcifications are
seen. The lungs are hyperinflated consistent with COPD. Mild
increased bronchial markings are noted bilaterally consistent with
the given clinical history of asthma.
IMPRESSION: COPD without acute abnormality. Mild bronchitic markings are noted
consistent with the given clinical history of underlying asthma.

## 2020-03-06 MED FILL — DUPIXENT 300 MG/2ML SOPN: 300 | 28 days supply | Qty: 4 | Fill #2

## 2020-03-19 NOTE — Progress Notes (Deleted)
Patient ID: Drew Sandoval, male    DOB: 07-10-1975    MRN: [id number]  HPI M  former smoker followed for severe asthma/ COPD, chronic sinusitis, hyper-eosinophilia and hyper IgE, complicated by history SVT/cardioversion FENO 03/18/16- 100  High, indicates allergic asthma Office Spirometry 03/18/2016-severe obstructive airways disease FVC 2.62/51%, FEV1 1.30/31%, FEV1/FVC 0.50, FEF 25-75 0.61/14% Office Spirometry 01/25/17-severe obstructive airways disease. FVC 3.52/68%, FEV1 1.80/43%, ratio or 0.51, FEF 25-75% 0.76/18% IgE was too high for Xolair. He could not afford Nucala or allergy vaccine. Cinqair infusion@ [facility]- ordered 07/21/17 ----------------------------------------------------   12/19/19- 44 year old male former smoker followed for severe asthma/ COPD/ Cinqair, chronic sinusitis, persistent eosinophilia, hyper IgE-too high for Xolair, history SVT/cardioversion/ ablation, Kidney stone Breztri, Cinqair IV, Singulair, Ventolin hfa, neb albuterol        2 [facility] ED visit 11/12/19 for cough and wheeze/ Asthma treated prednisone/ decadron      ED visit 7/19 for otitis media ------pt is sob when doing activities Asthma flare onset 2 days ago, described as DOE while at work, chest tightness but not wheezing. No cardiac problems since ablation. No edema, leg or chest pain, fever. Finished prednisone about a week after 7/19 ED visit.  Was using rescue inhaler 3-4x/ day last 2 days, but better today. Arrival O2 sat today 99% on RA.  03/20/20- 44 year old male former smoker followed for severe asthma/ COPD/ Cinqair, chronic sinusitis, persistent eosinophilia, hyper IgE-too high for Xolair, history SVT/cardioversion/ ablation, Kidney stone, Tremor, Breztri, Dupixent, Singulair, Ventolin hfa, neb albuterol Saw Dr Tat/ Neurology for tremor aggravated by meds>> primidone No recent ED visit Covid vax- Flu vax- Cinqair changed to Dupixent as of 12/31/19  ROS-see HPI  "+" =  positive Constitutional:    weight loss, night sweats, fevers, chills, fatigue, lassitude. HEENT:    headaches, difficulty swallowing, tooth/dental problems, sore throat,       sneezing, itching, ear ache, +nasal congestion, +post nasal drip, snoring CV:    chest pain, orthopnea, PND, swelling in lower extremities, anasarca,                                                      dizziness, palpitations Resp:   +shortness of breath with exertion or at rest.                productive cough,  non-productive cough, coughing up of blood.              change in color of mucus.  +wheezing.   Skin:    rash or lesions. GI:  No-   heartburn, indigestion, abdominal pain, nausea, vomiting,  GU: d. MS:   joint pain, stiffness, decreased range of motion, back pain. Neuro-     nothing unusual Psych:  change in mood or affect.  depression or anxiety.   memory loss.  Objective:   Physical Exam General- Alert, Oriented, Affect-reserved/ laconic, Distress-none acute;  Tall, thin Skin- +tatoos Lymphadenopathy- none Head- atraumatic            Eyes- Gross vision intact, PERRLA, conjunctivae clear secretions            Ears- Hearing, canals normal            Nose- Clear, no-Septal dev, mucus, polyps, erosion, perforation  Throat- Mallampati II , mucosa clear , drainage- none, tonsils- atrophic Neck- flexible , trachea midline, no stridor , thyroid nl, carotid no bruit Chest - symmetrical excursion , unlabored           Heart/CV- RRR  , no murmur , no gallop  , no rub, nl s1 s2                           - JVD- none , edema- none, stasis changes- none, varices- none           Lung-   +Diminished/ clear, unlabored,  Cough-none, dullness-none, rub- none,  Wheeze-none Abd- scaphoid  Br/ Gen/ Rectal- Not done, not indicated Extrem- cyanosis- none, clubbing, none, atrophy- none, strength- nl Neuro- unremarkable

## 2020-03-20 ENCOUNTER — Ambulatory Visit: Payer: BC Managed Care – PPO | Admitting: Internal Medicine

## 2020-03-28 ENCOUNTER — Telehealth: Payer: Self-pay | Admitting: Internal Medicine

## 2020-03-28 MED ORDER — BREZTRI AEROSPHERE 160-9-4.8 MCG/ACT IN AERO: 2.0000 | INHALATION_SPRAY | Freq: Two times a day (BID) | RESPIRATORY_TRACT | 11 refills | Status: DC

## 2020-03-28 NOTE — Telephone Encounter (Signed)
Rx for [employer] refilled. I called pt and left him a detailed msg informing him that this was done.

## 2020-04-01 MED FILL — DUPIXENT 300 MG/2ML SOPN: 300 | 28 days supply | Qty: 4 | Fill #3

## 2020-04-16 NOTE — Progress Notes (Deleted)
Patient ID: Drew Sandoval, male    DOB: 11/06/1975    MRN: [id number]  HPI M  former smoker followed for severe asthma/ COPD, chronic sinusitis, hyper-eosinophilia and hyper IgE, complicated by history SVT/cardioversion FENO 03/18/16- 100  High, indicates allergic asthma Office Spirometry 03/18/2016-severe obstructive airways disease FVC 2.62/51%, FEV1 1.30/31%, FEV1/FVC 0.50, FEF 25-75 0.61/14% Office Spirometry 01/25/17-severe obstructive airways disease. FVC 3.52/68%, FEV1 1.80/43%, ratio or 0.51, FEF 25-75% 0.76/18% IgE was too high for Xolair. He could not afford Nucala or allergy vaccine. Cinqair infusion@ [facility]- ordered 07/21/17 ----------------------------------------------------   12/19/19- 44 year old male former smoker followed for severe asthma/ COPD/ Cinqair, chronic sinusitis, persistent eosinophilia, hyper IgE-too high for Xolair, history SVT/cardioversion/ ablation, Kidney stone Breztri, Cinqair IV, Singulair, Ventolin hfa, neb albuterol        2 [facility] Covax ED visit 11/12/19 for cough and wheeze/ Asthma treated prednisone/ decadron      ED visit 7/19 for otitis media ------pt is sob when doing activities Asthma flare onset 2 days ago, described as DOE while at work, chest tightness but not wheezing. No cardiac problems since ablation. No edema, leg or chest pain, fever. Finished prednisone about a week after 7/19 ED visit.  Was using rescue inhaler 3-4x/ day last 2 days, but better today. Arrival O2 sat today 99% on RA.  04/17/20- 44 year old male former smoker followed for severe asthma/ COPD/ Cinqair, chronic sinusitis, persistent eosinophilia, hyper IgE-too high for Xolair, history SVT/cardioversion/ ablation, Kidney stone Breztri,, Singulair, Ventolin hfa, neb albuterol, Xopenex neb,  Dupixent started 8/21       Covid vax- Flu vax-     ROS-see HPI  "+" = positive Constitutional:    weight loss, night sweats, fevers, chills, fatigue, lassitude. HEENT:     headaches, difficulty swallowing, tooth/dental problems, sore throat,       sneezing, itching, ear ache, +nasal congestion, +post nasal drip, snoring CV:    chest pain, orthopnea, PND, swelling in lower extremities, anasarca,                                                      dizziness, palpitations Resp:   +shortness of breath with exertion or at rest.                productive cough,  non-productive cough, coughing up of blood.              change in color of mucus.  +wheezing.   Skin:    rash or lesions. GI:  No-   heartburn, indigestion, abdominal pain, nausea, vomiting,  GU: d. MS:   joint pain, stiffness, decreased range of motion, back pain. Neuro-     nothing unusual Psych:  change in mood or affect.  depression or anxiety.   memory loss.  Objective:   Physical Exam General- Alert, Oriented, Affect-reserved/ laconic, Distress-none acute;  Tall, thin Skin- +tatoos Lymphadenopathy- none Head- atraumatic            Eyes- Gross vision intact, PERRLA, conjunctivae clear secretions            Ears- Hearing, canals normal            Nose- Clear, no-Septal dev, mucus, polyps, erosion, perforation             Throat- Mallampati II , mucosa clear , drainage-  none, tonsils- atrophic Neck- flexible , trachea midline, no stridor , thyroid nl, carotid no bruit Chest - symmetrical excursion , unlabored           Heart/CV- RRR  , no murmur , no gallop  , no rub, nl s1 s2                           - JVD- none , edema- none, stasis changes- none, varices- none           Lung-   +Diminished/ clear, unlabored,  Cough-none, dullness-none, rub- none,  Wheeze-none Abd- scaphoid  Br/ Gen/ Rectal- Not done, not indicated Extrem- cyanosis- none, clubbing, none, atrophy- none, strength- nl Neuro- unremarkable

## 2020-04-17 ENCOUNTER — Ambulatory Visit: Payer: BC Managed Care – PPO | Admitting: Internal Medicine

## 2020-05-07 MED FILL — DUPIXENT 300 MG/2ML SOPN: 300 | 28 days supply | Qty: 4 | Fill #4

## 2020-05-21 ENCOUNTER — Other Ambulatory Visit: Payer: Self-pay

## 2020-05-21 ENCOUNTER — Ambulatory Visit
Admission: RE | Admit: 2020-05-21 | Discharge: 2020-05-21 | Disposition: A | Discharge status: Home / Self Care | Payer: BC Managed Care – PPO | Source: Ambulatory Visit | Attending: Family Medicine | Admitting: Family Medicine

## 2020-05-21 VITALS — BP 117/82 | HR 86 | Temp 98.8°F | Resp 20

## 2020-05-21 DIAGNOSIS — M25512 Pain in left shoulder: Secondary | ICD-10-CM

## 2020-05-21 DIAGNOSIS — J029 Acute pharyngitis, unspecified: Secondary | ICD-10-CM | POA: Diagnosis not present

## 2020-05-21 DIAGNOSIS — R0981 Nasal congestion: Secondary | ICD-10-CM | POA: Diagnosis not present

## 2020-05-21 DIAGNOSIS — Z1152 Encounter for screening for COVID-19: Secondary | ICD-10-CM | POA: Diagnosis not present

## 2020-05-21 DIAGNOSIS — B349 Viral infection, unspecified: Secondary | ICD-10-CM | POA: Diagnosis not present

## 2020-05-21 MED ORDER — MELOXICAM 7.5 MG PO TABS: 7.5000 mg | ORAL_TABLET | Freq: Every day | ORAL | 0 refills | Status: DC

## 2020-05-21 MED ORDER — CETIRIZINE HCL 10 MG PO TABS: 10.0000 mg | ORAL_TABLET | Freq: Every day | ORAL | 0 refills | Status: DC

## 2020-05-21 NOTE — ED Triage Notes (Signed)
Pt c/o of sore throat and sinus pressure. Denies fever x 1 day.  Also, c/o of left arm pain when raising arm above head x 2 weeks.

## 2020-05-21 NOTE — Discharge Instructions (Addendum)
Afrin nose spray over the counter   Take zyrtec daily  Biotene mouth rinse to help with dry mouth  I have sent in meloxicam for you to take once daily as needed for left shoulder pain and inflammation  Your COVID test is pending.  You should self quarantine until the test result is back.    Take Tylenol or ibuprofen as needed for fever or discomfort.  Rest and keep yourself hydrated.    Follow-up with your primary care provider if your symptoms are not improving.

## 2020-05-21 NOTE — ED Provider Notes (Addendum)
[facility]   [id number] 05/21/20 Arrival Time: 1716   CC: COVID symptoms  SUBJECTIVE: History from: patient.  Drew Sandoval is a 45 y.o. male who presents with sore throat and nasal congestion since last night. Denies sick exposure to COVID, flu or strep. Denies recent travel. Has negative history of Covid. Has completed Covid vaccines. Has not taken OTC medications for this. There are no aggravating or alleviating factors. Denies previous symptoms in the past. Denies fever, chills, fatigue, sinus pain, rhinorrhea, sore throat, SOB, wheezing, chest pain, nausea, changes in bowel or bladder habits.    Also reports L anterior shoulder pain x 2 weeks. No known injury. Has not attempted treatment at home. Reports that he cannot lift his arm above his shoulder. Denies previous symptoms. Denies numbness, tingling, loss of strength.  ROS: As per HPI.  All other pertinent ROS negative.     Past Medical History:  Diagnosis Date  . Asthma   . Seasonal allergies    Past Surgical History:  Procedure Laterality Date  . None    . SVT ABLATION N/A 09/07/2019   Procedure: SVT ABLATION;  Surgeon: Marinus Maw, MD;  Location: [facility] INVASIVE CV LAB;  Service: Cardiovascular;  Laterality: N/A;   Allergies  Allergen Reactions  . Azithromycin Rash  . Alvesco [Ciclesonide]     Possible reaction- rash  . Anoro Ellipta [Umeclidinium-Vilanterol]     Possible- rash  . Banana Swelling    Throat swelling   . Methylprednisolone Other (See Comments)    unknown  . Penicillins Rash    Has patient had a PCN reaction causing immediate rash, facial/tongue/throat swelling, SOB or lightheadedness with hypotension: Yes Has patient had a PCN reaction causing severe rash involving mucus membranes or skin necrosis: No Has patient had a PCN reaction that required hospitalization Yes Has patient had a PCN reaction occurring within the last 10 years: No If all of the above answers are "NO", then may  proceed with Cephalosporin use.     No current facility-administered medications on file prior to encounter.   Current Outpatient Medications on File Prior to Encounter  Medication Sig Dispense Refill  . albuterol (VENTOLIN HFA) 108 (90 Base) MCG/ACT inhaler INHALE 2 PUFFS BY MOUTH EVERY 4 HOURS AS NEEDED 1 each 5  . Budeson-Glycopyrrol-Formoterol (BREZTRI AEROSPHERE) 160-9-4.8 MCG/ACT AERO Inhale 2 puffs into the lungs 2 (two) times daily. Rinse mouth 10.7 g 11  . Dupilumab (DUPIXENT) 300 MG/2ML SOPN Inject 300 mg into the skin every 14 (fourteen) days. 12 mL 1  . montelukast (SINGULAIR) 10 MG tablet TAKE 1 TABLET BY MOUTH EVERYDAY AT BEDTIME 90 tablet 3  . primidone (MYSOLINE) 50 MG tablet 3 in the AM and 2 at night thereafter 450 tablet 1  . EPINEPHrine 0.3 mg/0.3 mL IJ SOAJ injection Inject into thigh for severe allergic reaction (Patient not taking: Reported on 12/31/2019) 1 Device prn  . levalbuterol (XOPENEX) 0.31 MG/3ML nebulizer solution Take 3 mLs (0.31 mg total) by nebulization every 4 (four) hours as needed for wheezing. 75 mL 12   Social History   Socioeconomic History  . Marital status: Single    Spouse name: Not on file  . Number of children: Not on file  . Years of education: Not on file  . Highest education level: Not on file  Occupational History  . Occupation: Psychologist, occupational: [employer]  Tobacco Use  . Smoking status: Former Smoker    Packs/day: 0.50  Years: 11.50    Pack years: 5.75    Types: Cigarettes    Quit date: 04/09/2010    Years since quitting: 10.1  . Smokeless tobacco: Former Neurosurgeon  . Tobacco comment: started at age 22.  1/2 ppd.    Vaping Use  . Vaping Use: Never used  Substance and Sexual Activity  . Alcohol use: Yes    Comment: rarely  . Drug use: No  . Sexual activity: Yes    Birth control/protection: None  Other Topics Concern  . Not on file  Social History Narrative   Left handed   Two story home   Drinks caffeine    Social Determinants of Health   Financial Resource Strain: Not on file  Food Insecurity: Not on file  Transportation Needs: Not on file  Physical Activity: Not on file  Stress: Not on file  Social Connections: Not on file  Intimate Partner Violence: Not on file   Family History  Problem Relation Age of Onset  . Emphysema Father   . Lung cancer Mother   . Arthritis Sister     OBJECTIVE:  Vitals:   05/21/20 1734  BP: 117/82  Pulse: 86  Resp: 20  Temp: 98.8 F (37.1 C)  TempSrc: Oral  SpO2: 95%     General appearance: alert; appears fatigued, but nontoxic; speaking in full sentences and tolerating own secretions HEENT: NCAT; Ears: EACs clear, TMs pearly gray; Eyes: PERRL.  EOM grossly intact. Sinuses: nontender; Nose: nares patent with clear rhinorrhea, Throat: oropharynx erythematous, cobblestoning present, tonsils non erythematous or enlarged, uvula midline  Neck: supple without LAD MSK: Anterior L shoulder mildly swollen, tender to palpation, limited ROM to L shoulder Lungs: unlabored respirations, symmetrical air entry; cough: absent; no respiratory distress; CTAB Heart: regular rate and rhythm.  Radial pulses 2+ symmetrical bilaterally Skin: warm and dry Psychological: alert and cooperative; normal mood and affect  LABS:  No results found for this or any previous visit (from the past 24 hour(s)).   ASSESSMENT & PLAN:  1. Viral illness   2. Encounter for screening for COVID-19   3. Sore throat   4. Nasal congestion   5. Acute pain of left shoulder     Meds ordered this encounter  Medications  . meloxicam (MOBIC) 7.5 MG tablet    Sig: Take 1 tablet (7.5 mg total) by mouth daily.    Dispense:  30 tablet    Refill:  0    Order Specific Question:   Supervising Provider    Answer:   Merrilee Jansky [id number]  . cetirizine (ZYRTEC ALLERGY) 10 MG tablet    Sig: Take 1 tablet (10 mg total) by mouth daily.    Dispense:  30 tablet    Refill:  0    Order  Specific Question:   Supervising Provider    Answer:   Merrilee Jansky [id number]   Prescribed meloxicam for shoulder pain Discussed that he could take Zyrtec and Afrin over-the-counter Continue supportive care at home COVID and flu testing ordered.  It will take between 2-3 days for test results. Someone will contact you regarding abnormal results.   Work note provided Patient should remain in quarantine until they have received Covid results.  If negative you may resume normal activities (go back to work/school) while practicing hand hygiene, social distance, and mask wearing.  If positive, patient should remain in quarantine for at least 5 days from symptom onset AND greater than 72 hours after symptoms resolution (  absence of fever without the use of fever-reducing medication and improvement in respiratory symptoms), whichever is longer Get plenty of rest and push fluids Use OTC zyrtec for nasal congestion, runny nose, and/or sore throat Use OTC flonase for nasal congestion and runny nose Use medications daily for symptom relief Use OTC medications like ibuprofen or tylenol as needed fever or pain Call or go to the ED if you have any new or worsening symptoms such as fever, worsening cough, shortness of breath, chest tightness, chest pain, turning blue, changes in mental status.  Reviewed expectations re: course of current medical issues. Questions answered. Outlined signs and symptoms indicating need for more acute intervention. Patient verbalized understanding. After Visit Summary given.         Moshe Cipro, NP 05/21/20 1817    Moshe Cipro, NP 05/21/20 1818

## 2020-05-23 LAB — COVID-19, FLU A+B NAA
Influenza A, NAA: NOT DETECTED
Influenza B, NAA: NOT DETECTED
SARS-CoV-2, NAA: DETECTED — AB

## 2020-06-03 MED FILL — DUPIXENT 300 MG/2ML SOPN: 300 | 28 days supply | Qty: 4 | Fill #5

## 2020-06-16 ENCOUNTER — Telehealth: Payer: Self-pay | Admitting: Internal Medicine

## 2020-06-16 ENCOUNTER — Other Ambulatory Visit: Payer: Self-pay | Admitting: *Deleted

## 2020-06-16 DIAGNOSIS — J4551 Severe persistent asthma with (acute) exacerbation: Secondary | ICD-10-CM

## 2020-06-16 MED ORDER — DUPIXENT 300 MG/2ML ~~LOC~~ SOAJ: 300.0000 mg | SUBCUTANEOUS | 11 refills | Status: DC

## 2020-06-16 NOTE — Telephone Encounter (Signed)
Fax received from [employer] stating Patient has been approved for Dupixent 300mg , continuing therapy, 06/23/20-06/23/21. New Dupixent prescription sent to [facility]. Nothing further at this time.

## 2020-06-16 NOTE — Progress Notes (Signed)
Patient ID: Drew Sandoval, male    DOB: 1975-09-05    MRN: [id number]  HPI M  former smoker followed for severe asthma/ COPD, chronic sinusitis, hyper-eosinophilia and hyper IgE, complicated by history SVT/cardioversion FENO 03/18/16- 100  High, indicates allergic asthma Office Spirometry 03/18/2016-severe obstructive airways disease FVC 2.62/51%, FEV1 1.30/31%, FEV1/FVC 0.50, FEF 25-75 0.61/14% Office Spirometry 01/25/17-severe obstructive airways disease. FVC 3.52/68%, FEV1 1.80/43%, ratio or 0.51, FEF 25-75% 0.76/18% IgE was too high for Xolair. He could not afford Nucala or allergy vaccine. Cinqair infusion@ [facility]- ordered 07/21/17 ----------------------------------------------------   12/19/19- 45 year old male former smoker followed for severe asthma/ COPD/ Cinqair, chronic sinusitis, persistent eosinophilia, hyper IgE-too high for Xolair, history SVT/cardioversion/ ablation, Kidney stone Breztri, Cinqair IV, Singulair, Ventolin hfa, neb albuterol        2 [facility] Covax ED visit 11/12/19 for cough and wheeze/ Asthma treated prednisone/ decadron      ED visit 7/19 for otitis media ------pt is sob when doing activities Asthma flare onset 2 days ago, described as DOE while at work, chest tightness but not wheezing. No cardiac problems since ablation. No edema, leg or chest pain, fever. Finished prednisone about a week after 7/19 ED visit.  Was using rescue inhaler 3-4x/ day last 2 days, but better today. Arrival O2 sat today 99% on RA.  06/17/20- 45 year old male former smoker followed for severe asthma/ COPD/ Cinqair, chronic sinusitis, persistent eosinophilia, hyper IgE-too high for Xolair, history SVT/cardioversion/ ablation, Kidney stone, Covid infection Jan, 2022,  Breztri, Dupixent, Singulair, Ventolin hfa, neb albuterol  Urgent care 1/12 for URI and shoulder pain> meloxicam, otc antihistmines/ deccongestants Started Dupixent every 2 weeks. Covid vax- 2 [facility] Flu vax- -----Patient  states he got Covid Jan 12th, states that he is having some shortness of breath since covid. No cough ED visit jan 12  + Covid. Went for shoulder pain and tested while there. Developed flu-like symptoms over next week. No infusion, no hosp. Now residual SOB more than baseline. Asthma has been well controlled on Dupixent. Little cough, no chest pain, no leg discomfort or swelling. Says BP running high since then.  Unable to find PCP in [facility] in [place] area who is taking new patients. Has been noting rapid heart rate (hx ablation for SVT) and intends to f/u with his cardiologist. Continues Dupixent. BP high on arrival 170/90- no PCP PFT 12/19/19- ordered, not done  ROS-see HPI  "+" = positive Constitutional:    weight loss, night sweats, fevers, chills, fatigue, lassitude. HEENT:    headaches, difficulty swallowing, tooth/dental problems, sore throat,       sneezing, itching, ear ache, +nasal congestion, +post nasal drip, snoring CV:    chest pain, orthopnea, PND, swelling in lower extremities, anasarca,                                                      dizziness, +palpitations Resp:   +shortness of breath with exertion or at rest.                productive cough,  non-productive cough, coughing up of blood.              change in color of mucus.  +wheezing.   Skin:    rash or lesions. GI:  No-   heartburn, indigestion, abdominal pain, nausea, vomiting,  GU: d. MS:   joint pain, stiffness, decreased range of motion, back pain. Neuro-     nothing unusual Psych:  change in mood or affect.  depression or anxiety.   memory loss.  Objective:   Physical Exam General- Alert, Oriented, Affect-reserved/ laconic, Distress-none acute;  Tall, thin Skin- +tatoos Lymphadenopathy- none Head- atraumatic            Eyes- Gross vision intact, PERRLA, conjunctivae clear secretions            Ears- Hearing, canals normal            Nose- Clear, no-Septal dev, mucus, polyps, erosion, perforation              Throat- Mallampati II , mucosa clear , drainage- none, tonsils- atrophic Neck- flexible , trachea midline, no stridor , thyroid nl, carotid no bruit Chest - symmetrical excursion , unlabored           Heart/CV- RRR  , no murmur , no gallop  , no rub, nl s1 s2                           - JVD- none , edema- none, stasis changes- none, varices- none           Lung-   +Diminished/ clear, unlabored,  Cough-none, dullness-none, rub- none,  Wheeze-none Abd- scaphoid  Br/ Gen/ Rectal- Not done, not indicated Extrem- cyanosis- none, clubbing, none, atrophy- none, strength- nl Neuro- unremarkable

## 2020-06-17 ENCOUNTER — Other Ambulatory Visit: Payer: Self-pay

## 2020-06-17 ENCOUNTER — Encounter: Payer: Self-pay | Admitting: Internal Medicine

## 2020-06-17 ENCOUNTER — Ambulatory Visit (INDEPENDENT_AMBULATORY_CARE_PROVIDER_SITE_OTHER): Payer: BC Managed Care – PPO

## 2020-06-17 ENCOUNTER — Ambulatory Visit (INDEPENDENT_AMBULATORY_CARE_PROVIDER_SITE_OTHER): Payer: BC Managed Care – PPO | Admitting: Internal Medicine

## 2020-06-17 VITALS — BP 170/90 | HR 66 | Temp 98.3°F | Ht 75.0 in | Wt 179.6 lb

## 2020-06-17 DIAGNOSIS — U071 COVID-19: Secondary | ICD-10-CM | POA: Insufficient documentation

## 2020-06-17 DIAGNOSIS — I10 Essential (primary) hypertension: Secondary | ICD-10-CM | POA: Diagnosis not present

## 2020-06-17 DIAGNOSIS — Z23 Encounter for immunization: Secondary | ICD-10-CM | POA: Diagnosis not present

## 2020-06-17 DIAGNOSIS — J1282 Pneumonia due to coronavirus disease 2019: Secondary | ICD-10-CM

## 2020-06-17 DIAGNOSIS — J4551 Severe persistent asthma with (acute) exacerbation: Secondary | ICD-10-CM

## 2020-06-17 DIAGNOSIS — J189 Pneumonia, unspecified organism: Secondary | ICD-10-CM | POA: Diagnosis not present

## 2020-06-17 HISTORY — DX: COVID-19: U07.1

## 2020-06-17 MED ORDER — OLMESARTAN MEDOXOMIL-HCTZ 20-12.5 MG PO TABS: 1.0000 | ORAL_TABLET | Freq: Every day | ORAL | 5 refills | Status: DC

## 2020-06-17 NOTE — Patient Instructions (Signed)
Order  CXR- dx covid pneumonia (January)  Order- lab- CMET   Dx hypertension  Script sent for Benicar HCT to take 1 daily for high blood pressure. Please make an appointment as soon as possible with your cardiologist for your racing heart and high blood pressure  Order- flu vax standard  [facility] please help find a [facility] primary care provider in the [place] area to establish.  We will let our Pharmacy Injection team know to reorder your Dupixent.  I'm glad it has been doing well.

## 2020-06-17 NOTE — Assessment & Plan Note (Signed)
Some increased dyspnea since infection, subtle and without associated complaints or findings. Plan- CXR, observation

## 2020-06-17 NOTE — Assessment & Plan Note (Signed)
Dupixent is helping. Not overtly wheezy now, but some awareness of more than baseline dyspnea on exertion since Covid last month. Nothing else points to VTE at this time. Plan- CXR, update labs, Flu vaccine, continue Dupixent

## 2020-06-17 NOTE — Assessment & Plan Note (Signed)
170/ 90 at this visit. He says BP running higher since he had Covid 1 month ago, and heart rate has been faster. Previously treated for SVT. Plan- he has no PCP, so I am putting him on Benicar HCT until he can get in with cardiology to f/u heart rate and BP. We will try to help him identify a PCP.

## 2020-06-27 ENCOUNTER — Encounter: Payer: Self-pay | Admitting: Internal Medicine

## 2020-06-27 ENCOUNTER — Ambulatory Visit: Payer: BC Managed Care – PPO | Admitting: Internal Medicine

## 2020-06-27 ENCOUNTER — Other Ambulatory Visit: Payer: Self-pay

## 2020-06-27 VITALS — BP 139/81 | HR 67 | Temp 98.4°F | Resp 18 | Ht 75.0 in | Wt 176.4 lb

## 2020-06-27 DIAGNOSIS — G250 Essential tremor: Secondary | ICD-10-CM

## 2020-06-27 DIAGNOSIS — J4550 Severe persistent asthma, uncomplicated: Secondary | ICD-10-CM | POA: Diagnosis not present

## 2020-06-27 DIAGNOSIS — J320 Chronic maxillary sinusitis: Secondary | ICD-10-CM

## 2020-06-27 DIAGNOSIS — Z7689 Persons encountering health services in other specified circumstances: Secondary | ICD-10-CM | POA: Diagnosis not present

## 2020-06-27 DIAGNOSIS — I10 Essential (primary) hypertension: Secondary | ICD-10-CM

## 2020-06-27 DIAGNOSIS — D824 Hyperimmunoglobulin E [IgE] syndrome: Secondary | ICD-10-CM

## 2020-06-27 HISTORY — DX: Essential tremor: G25.0

## 2020-06-27 MED ORDER — DOXYCYCLINE HYCLATE 100 MG PO TABS: 100.0000 mg | ORAL_TABLET | Freq: Two times a day (BID) | ORAL | 0 refills | Status: DC

## 2020-06-27 NOTE — Assessment & Plan Note (Signed)
On primidone Follows up with neurologist

## 2020-06-27 NOTE — Assessment & Plan Note (Signed)
Uses Breztri and Xopenex as needed On Dupixent Follows up with Pulmonologist

## 2020-06-27 NOTE — Assessment & Plan Note (Signed)
On Dupixent Follows up with pulmonologist

## 2020-06-27 NOTE — Progress Notes (Signed)
New Patient Office Visit  Subjective:  Patient ID: Drew Sandoval, male    DOB: Dec 21, 1975  Age: 45 y.o. MRN: [id number]  CC:  Chief Complaint  Patient presents with  . New Patient (Initial Visit)    New patient has not seen anyone in awhile does have lung doctor the lung doctor put him on bp pills since taking he has tremors and headaches really bad also has been having trouble out of his sinuses for about 3 months headache eye pain and pressure behind the eyes     HPI Drew Sandoval is a 45 year old male with past medical history of severe persistent asthma, chronic sinusitis, hyper IgE syndrome, SVT, hypertension and essential tremor who presents for establishing care.  He has been having frontal headache and sinus pressure, which have been worse for last few weeks.  He has a history of chronic sinusitis, for which he takes Zyrtec and Singulair.  He also gets Dupixent for hyper IgE syndrome.  He uses Breztri and as needed Xopenex for severe persistent asthma.  He denies dyspnea or wheezing currently.  He has a history of SVT, for which he follows up with cardiologist.  He was recently diagnosed with hypertension, for which he was started on olmesartan-HCTZ.  His blood pressure is well controlled currently.  He states that he has been having shaking movements recently and was concerned if his blood pressure medication was too strong for him.  Of note, he has a history of essential tremor, for which he takes primidone and he follows up with Neurologist.  He has had 2 doses of COVID vaccine.  He had COVID infection in the last month.  Past Medical History:  Diagnosis Date  . Acute respiratory failure with hypoxia (HCC) 07/23/2013  . Asthma   . Asthma    Phreesia 06/26/2020  . COVID-19 virus infection 06/17/2020   January, 2022. No hosp, no MAB  . Seasonal allergies   . Status asthmaticus 10/19/2010    Past Surgical History:  Procedure Laterality Date  . None    . SVT ABLATION N/A  09/07/2019   Procedure: SVT ABLATION;  Surgeon: Marinus Maw, MD;  Location: [facility] INVASIVE CV LAB;  Service: Cardiovascular;  Laterality: N/A;    Family History  Problem Relation Age of Onset  . Emphysema Father   . Lung cancer Mother   . Arthritis Sister     Social History   Socioeconomic History  . Marital status: Single    Spouse name: Not on file  . Number of children: Not on file  . Years of education: Not on file  . Highest education level: Not on file  Occupational History  . Occupation: Psychologist, occupational: [employer]  Tobacco Use  . Smoking status: Former Smoker    Packs/day: 0.50    Years: 11.50    Pack years: 5.75    Types: Cigarettes    Quit date: 04/09/2010    Years since quitting: 10.2  . Smokeless tobacco: Former Neurosurgeon  . Tobacco comment: started at age 53.  1/2 ppd.    Vaping Use  . Vaping Use: Never used  Substance and Sexual Activity  . Alcohol use: Yes    Comment: rarely  . Drug use: No  . Sexual activity: Yes    Birth control/protection: None  Other Topics Concern  . Not on file  Social History Narrative   Left handed   Two story home   Drinks caffeine   Social  Determinants of Health   Financial Resource Strain: Not on file  Food Insecurity: Not on file  Transportation Needs: Not on file  Physical Activity: Not on file  Stress: Not on file  Social Connections: Not on file  Intimate Partner Violence: Not on file    ROS Review of Systems  Constitutional: Negative for chills and fever.  HENT: Positive for congestion, postnasal drip, sinus pressure and sinus pain. Negative for sore throat.   Eyes: Negative for pain and discharge.  Respiratory: Negative for cough and shortness of breath.   Cardiovascular: Negative for chest pain and palpitations.  Gastrointestinal: Negative for constipation, diarrhea, nausea and vomiting.  Endocrine: Negative for polydipsia and polyuria.  Genitourinary: Negative for dysuria and hematuria.   Musculoskeletal: Negative for neck pain and neck stiffness.  Skin: Negative for rash.  Neurological: Positive for tremors and headaches. Negative for dizziness, weakness and numbness.  Psychiatric/Behavioral: Negative for agitation and behavioral problems.    Objective:   Today's Vitals: BP 139/81 (BP Location: Left Arm, Patient Position: Sitting)   Pulse 67   Temp 98.4 F (36.9 C) (Oral)   Resp 18   Ht 6\' 3"  (1.905 m)   Wt 176 lb 6.4 oz (80 kg)   SpO2 97%   BMI 22.05 kg/m   Physical Exam Vitals reviewed.  Constitutional:      General: He is not in acute distress.    Appearance: He is not diaphoretic.  HENT:     Head: Normocephalic and atraumatic.     Nose: Congestion present.     Mouth/Throat:     Mouth: Mucous membranes are moist.  Eyes:     General: No scleral icterus.    Extraocular Movements: Extraocular movements intact.     Pupils: Pupils are equal, round, and reactive to light.  Cardiovascular:     Rate and Rhythm: Normal rate and regular rhythm.     Pulses: Normal pulses.     Heart sounds: Normal heart sounds. No murmur heard.   Pulmonary:     Breath sounds: Normal breath sounds. No wheezing or rales.  Abdominal:     Palpations: Abdomen is soft.     Tenderness: There is no abdominal tenderness.  Musculoskeletal:     Cervical back: Neck supple. No tenderness.     Right lower leg: No edema.     Left lower leg: No edema.  Skin:    General: Skin is warm.     Findings: No rash.  Neurological:     General: No focal deficit present.     Mental Status: He is alert and oriented to person, place, and time.     Sensory: No sensory deficit.     Motor: No weakness.  Psychiatric:        Mood and Affect: Mood normal.        Behavior: Behavior normal.     Assessment & Plan:   Problem List Items Addressed This Visit      Encounter to establish care    -  Primary  Care established Previous chart reviewed History and medications reviewed with the  patient   Cardiovascular and Mediastinum   Hypertension, essential    BP Readings from Last 1 Encounters:  06/27/20 139/81   Recently diagnosed On Olmesartan-HCTZ 20-12.5 mg Counseled for compliance with the medications Advised DASH diet and moderate exercise/walking, at least 150 mins/week         Respiratory   Asthma, severe persistent    Uses Breztri and  Xopenex as needed On Dupixent Follows up with Pulmonologist      Sinusitis, maxillary, chronic    On Zyrtec and Singulair Worse symptoms recently, would treat possible bacterial superinfection with doxycycline as he is allergic to penicillin and azithromycin      Relevant Medications   doxycycline (VIBRA-TABS) 100 MG tablet     Nervous and Auditory   Essential tremor    On primidone Follows up with neurologist        Other   Hyper-IgE syndrome (HCC)    On Dupixent Follows up with pulmonologist       Outpatient Encounter Medications as of 06/27/2020  Medication Sig  . albuterol (VENTOLIN HFA) 108 (90 Base) MCG/ACT inhaler INHALE 2 PUFFS BY MOUTH EVERY 4 HOURS AS NEEDED  . Budeson-Glycopyrrol-Formoterol (BREZTRI AEROSPHERE) 160-9-4.8 MCG/ACT AERO Inhale 2 puffs into the lungs 2 (two) times daily. Rinse mouth  . cetirizine (ZYRTEC ALLERGY) 10 MG tablet Take 1 tablet (10 mg total) by mouth daily.  Marland Kitchen doxycycline (VIBRA-TABS) 100 MG tablet Take 1 tablet (100 mg total) by mouth 2 (two) times daily.  . Dupilumab (DUPIXENT) 300 MG/2ML SOPN Inject 300 mg into the skin every 14 (fourteen) days.  Marland Kitchen EPINEPHrine 0.3 mg/0.3 mL IJ SOAJ injection Inject into thigh for severe allergic reaction  . levalbuterol (XOPENEX) 0.31 MG/3ML nebulizer solution Take 3 mLs (0.31 mg total) by nebulization every 4 (four) hours as needed for wheezing.  . montelukast (SINGULAIR) 10 MG tablet TAKE 1 TABLET BY MOUTH EVERYDAY AT BEDTIME  . olmesartan-hydrochlorothiazide (BENICAR HCT) 20-12.5 MG tablet Take 1 tablet by mouth daily.  . primidone  (MYSOLINE) 50 MG tablet 3 in the AM and 2 at night thereafter  . [DISCONTINUED] meloxicam (MOBIC) 7.5 MG tablet Take 1 tablet (7.5 mg total) by mouth daily. (Patient not taking: Reported on 06/27/2020)   No facility-administered encounter medications on file as of 06/27/2020.    Follow-up: Return in about 2 months (around 08/25/2020) for HTN.   Anabel Halon, MD

## 2020-06-27 NOTE — Assessment & Plan Note (Signed)
On Zyrtec and Singulair Worse symptoms recently, would treat possible bacterial superinfection with doxycycline as he is allergic to penicillin and azithromycin

## 2020-06-27 NOTE — Patient Instructions (Signed)
Please start taking Doxycycline as prescribed.  Please continue to take other medications as prescribed.  Please follow low salt diet and perform moderate exercise/walking at least 150 mins/week.  Please contact your Cardiologist to schedule an appointment.

## 2020-06-27 NOTE — Assessment & Plan Note (Addendum)
BP Readings from Last 1 Encounters:  06/27/20 139/81   Recently diagnosed On Olmesartan-HCTZ 20-12.5 mg Counseled for compliance with the medications Advised DASH diet and moderate exercise/walking, at least 150 mins/week

## 2020-06-30 ENCOUNTER — Telehealth: Payer: Self-pay | Admitting: Internal Medicine

## 2020-06-30 NOTE — Telephone Encounter (Signed)
Error

## 2020-07-02 ENCOUNTER — Other Ambulatory Visit (INDEPENDENT_AMBULATORY_CARE_PROVIDER_SITE_OTHER): Payer: BC Managed Care – PPO

## 2020-07-02 DIAGNOSIS — J1282 Pneumonia due to coronavirus disease 2019: Secondary | ICD-10-CM | POA: Diagnosis not present

## 2020-07-02 DIAGNOSIS — U071 COVID-19: Secondary | ICD-10-CM | POA: Diagnosis not present

## 2020-07-02 LAB — COMPREHENSIVE METABOLIC PANEL
ALT: 12 U/L (ref 0–53)
AST: 17 U/L (ref 0–37)
Albumin: 4 g/dL (ref 3.5–5.2)
Alkaline Phosphatase: 70 U/L (ref 39–117)
BUN: 16 mg/dL (ref 6–23)
CO2: 24 mEq/L (ref 19–32)
Calcium: 9 mg/dL (ref 8.4–10.5)
Chloride: 108 mEq/L (ref 96–112)
Creatinine, Ser: 0.82 mg/dL (ref 0.40–1.50)
GFR: 106.88 mL/min (ref >60.00)
Glucose, Bld: 84 mg/dL (ref 70–99)
Potassium: 3.7 mEq/L (ref 3.5–5.1)
Sodium: 140 mEq/L (ref 135–145)
Total Bilirubin: 0.2 mg/dL (ref 0.2–1.2)
Total Protein: 7.4 g/dL (ref 6.0–8.3)

## 2020-07-02 MED FILL — DUPIXENT 300 MG/2ML SOPN: 300 | 28 days supply | Qty: 4 | Fill #0

## 2020-07-10 ENCOUNTER — Ambulatory Visit: Payer: BC Managed Care – PPO | Admitting: Internal Medicine

## 2020-07-10 ENCOUNTER — Encounter: Payer: Self-pay | Admitting: Internal Medicine

## 2020-07-10 ENCOUNTER — Other Ambulatory Visit: Payer: Self-pay

## 2020-07-10 VITALS — BP 140/80 | HR 71 | Ht 75.0 in | Wt 176.4 lb

## 2020-07-10 DIAGNOSIS — I10 Essential (primary) hypertension: Secondary | ICD-10-CM | POA: Diagnosis not present

## 2020-07-10 DIAGNOSIS — I471 Supraventricular tachycardia: Secondary | ICD-10-CM | POA: Diagnosis not present

## 2020-07-10 MED ORDER — METOPROLOL SUCCINATE ER 25 MG PO TB24: 25.0000 mg | ORAL_TABLET | Freq: Every day | ORAL | 3 refills | Status: DC

## 2020-07-10 NOTE — Progress Notes (Signed)
HPI Drew Sandoval returns today for followup. He is a pleasant 45 yo man with a h/o SVT who underwent EP study and catheter ablation over a year ago. 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 Since his ablation, he notes that he feels like his heart might try to race for a second or two but then beats normally. No syncope. He returns today for ongoing evaluation of HTN. He has mild HTN based on his Fit Bit. He was tried on Benicar/HCTZ but this caused a HA.  Allergies  Allergen Reactions  . Azithromycin Rash  . Alvesco [Ciclesonide]     Possible reaction- rash  . Anoro Ellipta [Umeclidinium-Vilanterol]     Possible- rash  . Banana Swelling    Throat swelling   . Methylprednisolone Other (See Comments)    unknown  . Penicillins Rash    Has patient had a PCN reaction causing immediate rash, facial/tongue/throat swelling, SOB or lightheadedness with hypotension: Yes Has patient had a PCN reaction causing severe rash involving mucus membranes or skin necrosis: No Has patient had a PCN reaction that required hospitalization Yes Has patient had a PCN reaction occurring within the last 10 years: No If all of the above answers are "NO", then may proceed with Cephalosporin use.       Current Outpatient Medications  Medication Sig Dispense Refill  . albuterol (VENTOLIN HFA) 108 (90 Base) MCG/ACT inhaler INHALE 2 PUFFS BY MOUTH EVERY 4 HOURS AS NEEDED 1 each 5  . Budeson-Glycopyrrol-Formoterol (BREZTRI AEROSPHERE) 160-9-4.8 MCG/ACT AERO Inhale 2 puffs into the lungs 2 (two) times daily. Rinse mouth 10.7 g 11  . cetirizine (ZYRTEC ALLERGY) 10 MG tablet Take 1 tablet (10 mg total) by mouth daily. 30 tablet 0  . doxycycline (VIBRA-TABS) 100 MG tablet Take 1 tablet (100 mg total) by mouth 2 (two) times daily. 20 tablet 0  . Dupilumab (DUPIXENT) 300 MG/2ML SOPN Inject 300 mg into the skin every 14 (fourteen) days. 4 mL 11  . EPINEPHrine 0.3 mg/0.3 mL IJ SOAJ  injection Inject into thigh for severe allergic reaction 1 Device prn  . levalbuterol (XOPENEX) 0.31 MG/3ML nebulizer solution Take 3 mLs (0.31 mg total) by nebulization every 4 (four) hours as needed for wheezing. 75 mL 12  . metoprolol succinate (TOPROL XL) 25 MG 24 hr tablet Take 1 tablet (25 mg total) by mouth daily. 90 tablet 3  . montelukast (SINGULAIR) 10 MG tablet TAKE 1 TABLET BY MOUTH EVERYDAY AT BEDTIME 90 tablet 3  . olmesartan-hydrochlorothiazide (BENICAR HCT) 20-12.5 MG tablet Take 1 tablet by mouth daily. 31 tablet 5  . primidone (MYSOLINE) 50 MG tablet 3 in the AM and 2 at night thereafter 450 tablet 1   No current facility-administered medications for this visit.     Past Medical History:  Diagnosis Date  . Acute conjunctivitis of right eye 10/07/2014  . Acute respiratory failure with hypoxia (HCC) 07/23/2013  . Asthma   . Asthma    Phreesia 06/26/2020  . Asthma, severe persistent 07/16/2010   PFT >> spiro 2012 with FEV1 1.14 (28%), ratio 45 Alpha 1 (08/12/10) >>MM ([facility])  IGE 1747, positive allergy profile >> Elevated eos on CBC S/p intubation 10/2010 Job exposure to respiratory irritant - bleach, chicken Allergy vaccine begun 11/09/2010. DC'd by pt 2013- resumed 2013> 1:50 GH; DC'd [facility] 9/24-9/27/ 2013- Status asthma, no vent. [facility] 3/ 2015- status ast  . COVID-19 virus  infection 06/17/2020   January, 2022. No hosp, no MAB  . Eczema, allergic 07/22/2011  . Essential tremor 06/27/2020  . Seasonal allergies   . Seasonal and perennial allergic rhinitis 07/17/2014   FENO 03/18/16- 100  High, indicates allergic asthma  . Sinusitis, maxillary, 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   Sinus CT 12/10/2013 >>>   . Status asthmaticus 10/19/2010  . Sustained SVT (HCC) 09/24/2014  . SVT (supraventricular tachycardia) (HCC) 09/24/2014    ROS:   All systems reviewed and negative except as noted in the HPI.   Past Surgical History:  Procedure Laterality Date  . None    . SVT  ABLATION N/A 09/07/2019   Procedure: SVT ABLATION;  Surgeon: Marinus Maw, MD;  Location: [facility] INVASIVE CV LAB;  Service: Cardiovascular;  Laterality: N/A;     Family History  Problem Relation Age of Onset  . Emphysema Father   . Lung cancer Mother   . Arthritis Sister      Social History   Socioeconomic History  . Marital status: Single    Spouse name: Not on file  . Number of children: Not on file  . Years of education: Not on file  . Highest education level: Not on file  Occupational History  . Occupation: Psychologist, occupational: [employer]  Tobacco Use  . Smoking status: Former Smoker    Packs/day: 0.50    Years: 11.50    Pack years: 5.75    Types: Cigarettes    Quit date: 04/09/2010    Years since quitting: 10.2  . Smokeless tobacco: Former Neurosurgeon  . Tobacco comment: started at age 67.  1/2 ppd.    Vaping Use  . Vaping Use: Never used  Substance and Sexual Activity  . Alcohol use: Yes    Comment: rarely  . Drug use: No  . Sexual activity: Yes    Birth control/protection: None  Other Topics Concern  . Not on file  Social History Narrative   Left handed   Two story home   Drinks caffeine   Social Determinants of Health   Financial Resource Strain: Not on file  Food Insecurity: Not on file  Transportation Needs: Not on file  Physical Activity: Not on file  Stress: Not on file  Social Connections: Not on file  Intimate Partner Violence: Not on file     BP 140/80   Pulse 71   Ht 6\' 3"  (1.905 m)   Wt 176 lb 6.4 oz (80 kg)   SpO2 97%   BMI 22.05 kg/m   Physical Exam:  Well appearing NAD HEENT: Unremarkable Neck:  No JVD, no thyromegally Lymphatics:  No adenopathy Back:  No CVA tenderness Lungs:  Clear with no wheezes HEART:  Regular rate rhythm, no murmurs, no rubs, no clicks Abd:  soft, positive bowel sounds, no organomegally, no rebound, no guarding Ext:  2 plus pulses, no edema, no cyanosis, no clubbing Skin:  No rashes no  nodules Neuro:  CN II through XII intact, motor grossly intact  EKG - nsr  Assess/Plan: 1. SVT - he is s/p ablation without any recurrent SVT. 2. HTN - this is a new problem. I will have him start low dose toprol. We will uptitrate as needed.  Melvina Pangelinan,MD

## 2020-07-10 NOTE — Patient Instructions (Addendum)
Medication Instructions:  Your physician has recommended you make the following change in your medication:   1.  START taking metoprolol succinate 25 mg-  Take one tablet by mouth daily   Labwork: None ordered.  Testing/Procedures: None ordered.  Follow-Up: Your physician wants you to follow-up in: 6 months with Gregg Taylor, MD or one of the following Advanced Practice Providers on your designated Care Team:    Amber Seiler, NP  Renee Ursuy, PA-C  Michael "Andy" Tillery, PA-C   Any Other Special Instructions Will Be Listed Below (If Applicable).  If you need a refill on your cardiac medications before your next appointment, please call your pharmacy.   Metoprolol Extended-Release Tablets What is this medicine? METOPROLOL (me TOE proe lole) is a beta blocker. It decreases the amount of work your heart has to do and helps your heart beat regularly. It treats high blood pressure and/or prevent chest pain (also called angina). It also treats heart failure. This medicine may be used for other purposes; ask your health care provider or pharmacist if you have questions. COMMON BRAND NAME(S): toprol, Toprol XL What should I tell my health care provider before I take this medicine? They need to know if you have any of these conditions:  diabetes  heart or vessel disease like slow heart rate, worsening heart failure, heart block, sick sinus syndrome or Raynaud's disease  kidney disease  liver disease  lung or breathing disease, like asthma or emphysema  pheochromocytoma  thyroid disease  an unusual or allergic reaction to metoprolol, other beta-blockers, medicines, foods, dyes, or preservatives  pregnant or trying to get pregnant  breast-feeding How should I use this medicine? Take this drug by mouth. Take it as directed on the prescription label at the same time every day. Take it with food. You may cut the tablet in half if it is scored (has a line in the middle of it).  This may help you swallow the tablet if the whole tablet is too big. Be sure to take both halves. Do not take just one-half of the tablet. Keep taking it unless your health care provider tells you to stop. Talk to your health care provider about the use of this drug in children. While it may be prescribed for children as young as 6 for selected conditions, precautions do apply. Overdosage: If you think you have taken too much of this medicine contact a poison control center or emergency room at once. NOTE: This medicine is only for you. Do not share this medicine with others. What if I miss a dose? If you miss a dose, take it as soon as you can. If it is almost time for your next dose, take only that dose. Do not take double or extra doses. What may interact with this medicine? This medicine may interact with the following medications:  certain medicines for blood pressure, heart disease, irregular heart beat  certain medicines for depression, like monoamine oxidase (MAO) inhibitors, fluoxetine, or paroxetine  clonidine  dobutamine  epinephrine  isoproterenol  reserpine This list may not describe all possible interactions. Give your health care provider a list of all the medicines, herbs, non-prescription drugs, or dietary supplements you use. Also tell them if you smoke, drink alcohol, or use illegal drugs. Some items may interact with your medicine. What should I watch for while using this medicine? Visit your doctor or health care professional for regular check ups. Contact your doctor right away if your symptoms worsen. Check your   blood pressure and pulse rate regularly. Ask your health care professional what your blood pressure and pulse rate should be, and when you should contact them. You may get drowsy or dizzy. Do not drive, use machinery, or do anything that needs mental alertness until you know how this medicine affects you. Do not sit or stand up quickly, especially if you are an  older patient. This reduces the risk of dizzy or fainting spells. Contact your doctor if these symptoms continue. Alcohol may interfere with the effect of this medicine. Avoid alcoholic drinks. This medicine may increase blood sugar. Ask your healthcare provider if changes in diet or medicines are needed if you have diabetes. What side effects may I notice from receiving this medicine? Side effects that you should report to your doctor or health care professional as soon as possible:  allergic reactions like skin rash, itching or hives  cold or numb hands or feet  depression  difficulty breathing  faint  fever with sore throat  irregular heartbeat, chest pain  rapid weight gain  signs and symptoms of high blood sugar such as being more thirsty or hungry or having to urinate more than normal. You may also feel very tired or have blurry vision.  swollen legs or ankles Side effects that usually do not require medical attention (report to your doctor or health care professional if they continue or are bothersome):  anxiety or nervousness  change in sex drive or performance  dry skin  headache  nightmares or trouble sleeping  short term memory loss  stomach upset or diarrhea This list may not describe all possible side effects. Call your doctor for medical advice about side effects. You may report side effects to FDA at 1-800-FDA-1088. Where should I keep my medicine? Keep out of the reach of children and pets. Store at room temperature between 20 and 25 degrees C (68 and 77 degrees F). Throw away any unused drug after the expiration date. NOTE: This sheet is a summary. It may not cover all possible information. If you have questions about this medicine, talk to your doctor, pharmacist, or health care provider.  2021 Elsevier/Gold Standard (2018-12-07 18:23:00)      

## 2020-07-30 MED FILL — DUPIXENT 300 MG/2ML SOPN: 300 | 28 days supply | Qty: 4 | Fill #1

## 2020-08-07 ENCOUNTER — Other Ambulatory Visit (HOSPITAL_COMMUNITY): Payer: Self-pay

## 2020-08-14 NOTE — Progress Notes (Signed)
Assessment/Plan:    1.  Essential Tremor, exacerbated by medication for asthma  -Continue primidone, 3 tablets in the morning, 2 tablets at night   -understands that tremor likely will never be fully under control due to other medications that induce tremor   -cardiology started him on low dose metoprolol for SVT.  It is probably too low dose to help tremor but given severity of asthma, wouldn't want to increase BB even if able.  -start artane 2 mg bid with meals.  Discussed r/b/se, including anticholinergic SE.    -discussed dbs in detail.   2.  Severe Asthma  -Medications for this contribute to tremor  3.  SVT  -Following with cardiology.  Subjective:   Drew Sandoval was seen today in follow up for essential tremor, which is much exacerbated by medication for his asthma.  My previous records were reviewed prior to todays visit.  Has seen pulmonary and cardiology since last visit.  Those records have been reviewed.  Cardiology did start him on metoprolol, 25 mg daily.  He also established with a new primary care doctor in February.  He thinks that tremor is getting worse, L more than R.  He is L handed and is interfering with eating and writing.  Asthma is under good control per pt.    Current prescribed movement disorder medications: Primidone, 50 mg, 3 in the morning, 2 at night (increased last visit)   PREVIOUS MEDICATIONS: primidone  ALLERGIES:   Allergies  Allergen Reactions  . Azithromycin Rash  . Alvesco [Ciclesonide]     Possible reaction- rash  . Anoro Ellipta [Umeclidinium-Vilanterol]     Possible- rash  . Banana Swelling    Throat swelling   . Methylprednisolone Other (See Comments)    unknown  . Penicillins Rash    Has patient had a PCN reaction causing immediate rash, facial/tongue/throat swelling, SOB or lightheadedness with hypotension: Yes Has patient had a PCN reaction causing severe rash involving mucus membranes or skin necrosis: No Has patient  had a PCN reaction that required hospitalization Yes Has patient had a PCN reaction occurring within the last 10 years: No If all of the above answers are "NO", then may proceed with Cephalosporin use.      CURRENT MEDICATIONS:  Outpatient Encounter Medications as of 08/18/2020  Medication Sig  . albuterol (VENTOLIN HFA) 108 (90 Base) MCG/ACT inhaler INHALE 2 PUFFS BY MOUTH EVERY 4 HOURS AS NEEDED  . Budeson-Glycopyrrol-Formoterol (BREZTRI AEROSPHERE) 160-9-4.8 MCG/ACT AERO Inhale 2 puffs into the lungs 2 (two) times daily. Rinse mouth  . cetirizine (ZYRTEC ALLERGY) 10 MG tablet Take 1 tablet (10 mg total) by mouth daily.  . Dupilumab 300 MG/2ML SOPN INJECT 300 MG INTO THE SKIN EVERY 14 (FOURTEEN) DAYS.  Marland Kitchen EPINEPHrine 0.3 mg/0.3 mL IJ SOAJ injection Inject into thigh for severe allergic reaction  . levalbuterol (XOPENEX) 0.31 MG/3ML nebulizer solution Take 3 mLs (0.31 mg total) by nebulization every 4 (four) hours as needed for wheezing.  . metoprolol succinate (TOPROL XL) 25 MG 24 hr tablet Take 1 tablet (25 mg total) by mouth daily.  . montelukast (SINGULAIR) 10 MG tablet TAKE 1 TABLET BY MOUTH EVERYDAY AT BEDTIME  . primidone (MYSOLINE) 50 MG tablet 3 in the AM and 2 at night thereafter  . [DISCONTINUED] doxycycline (VIBRA-TABS) 100 MG tablet Take 1 tablet (100 mg total) by mouth 2 (two) times daily. (Patient not taking: Reported on 08/18/2020)  . [DISCONTINUED] Dupilumab 300 MG/2ML SOPN INJECT 300 MG  INTO THE SKIN EVERY 14 DAYS.  . [DISCONTINUED] olmesartan-hydrochlorothiazide (BENICAR HCT) 20-12.5 MG tablet Take 1 tablet by mouth daily. (Patient not taking: Reported on 08/18/2020)   No facility-administered encounter medications on file as of 08/18/2020.     Objective:    PHYSICAL EXAMINATION:    VITALS:   Vitals:   08/18/20 0814  BP: 118/86  Pulse: 79  SpO2: 98%  Weight: 180 lb (81.6 kg)  Height: 6\' 3"  (1.905 m)    GEN:  The patient appears stated age and is in  NAD. HEENT:  Normocephalic, atraumatic.  The mucous membranes are moist.   Neurological examination:  Orientation: The patient is alert and oriented x3. Cranial nerves: There is good facial symmetry. The speech is fluent and clear. Soft palate rises symmetrically and there is no tongue deviation. Hearing is intact to conversational tone. Sensation: Sensation is intact to light touch throughout Motor: Strength is at least antigravity x4.  Movement examination: Tone: There is normal tone in the UE/LE Abnormal movements: L>R intention tremor.  he has trouble getting hands on the paper to do the archimedes spirals but once they are down, he does okay.  Tremor is mod on the L Coordination:  There is no decremation with RAM's Gait and Station: The patient has no difficulty arising out of a deep-seated chair without the use of the hands. The patient's stride length is good I have reviewed and interpreted the following labs independently   Chemistry      Component Value Date/Time   NA 140 07/02/2020 0842   K 3.7 07/02/2020 0842   CL 108 07/02/2020 0842   CO2 24 07/02/2020 0842   BUN 16 07/02/2020 0842   CREATININE 0.82 07/02/2020 0842      Component Value Date/Time   CALCIUM 9.0 07/02/2020 0842   ALKPHOS 70 07/02/2020 0842   AST 17 07/02/2020 0842   ALT 12 07/02/2020 0842   BILITOT 0.2 07/02/2020 0842      Lab Results  Component Value Date   WBC 8.3 10/16/2019   HGB 13.8 10/16/2019   HCT 41.4 10/16/2019   MCV 89.4 10/16/2019   PLT 224.0 10/16/2019   No results found for: TSH   Chemistry      Component Value Date/Time   NA 140 07/02/2020 0842   K 3.7 07/02/2020 0842   CL 108 07/02/2020 0842   CO2 24 07/02/2020 0842   BUN 16 07/02/2020 0842   CREATININE 0.82 07/02/2020 0842      Component Value Date/Time   CALCIUM 9.0 07/02/2020 0842   ALKPHOS 70 07/02/2020 0842   AST 17 07/02/2020 0842   ALT 12 07/02/2020 0842   BILITOT 0.2 07/02/2020 0842         Total time  spent on today's visit was 20 minutes, including both face-to-face time and nonface-to-face time.  Time included that spent on review of records (prior notes available to me/labs/imaging if pertinent), discussing treatment and goals, answering patient's questions and coordinating care.  Cc:  07/04/2020, MD

## 2020-08-18 ENCOUNTER — Encounter: Payer: Self-pay | Admitting: Neurology

## 2020-08-18 ENCOUNTER — Other Ambulatory Visit: Payer: Self-pay

## 2020-08-18 ENCOUNTER — Ambulatory Visit (INDEPENDENT_AMBULATORY_CARE_PROVIDER_SITE_OTHER): Payer: BC Managed Care – PPO | Admitting: Neurology

## 2020-08-18 VITALS — BP 118/86 | HR 79 | Ht 75.0 in | Wt 180.0 lb

## 2020-08-18 DIAGNOSIS — J4550 Severe persistent asthma, uncomplicated: Secondary | ICD-10-CM | POA: Diagnosis not present

## 2020-08-18 DIAGNOSIS — G250 Essential tremor: Secondary | ICD-10-CM | POA: Diagnosis not present

## 2020-08-18 MED ORDER — TRIHEXYPHENIDYL HCL 2 MG PO TABS
2.0000 mg | ORAL_TABLET | Freq: Two times a day (BID) | ORAL | 1 refills | Status: DC
Start: 2020-08-18 — End: 2021-02-27

## 2020-08-18 NOTE — Patient Instructions (Signed)
1.  Keep taking primidone, 3 in the AM, 2 at night 2.  Start trihexyphenidyl, 2 mg with breakfast and with lunch  The physicians and staff at [facility] are committed to providing excellent care. You may receive a survey requesting feedback about your experience at our office. We strive to receive "very good" responses to the survey questions. If you feel that your experience would prevent you from giving the office a "very good " response, please contact our office to try to remedy the situation. We may be reached at [phone]. Thank you for taking the time out of your busy day to complete the survey.

## 2020-08-26 ENCOUNTER — Other Ambulatory Visit (HOSPITAL_COMMUNITY): Payer: Self-pay

## 2020-08-26 MED FILL — Dupilumab Subcutaneous Soln Auto-injector 300 MG/2ML: SUBCUTANEOUS | 28 days supply | Qty: 4 | Fill #0 | Status: AC

## 2020-08-29 ENCOUNTER — Ambulatory Visit: Payer: BC Managed Care – PPO | Admitting: Internal Medicine

## 2020-09-03 ENCOUNTER — Ambulatory Visit: Payer: BC Managed Care – PPO | Admitting: Internal Medicine

## 2020-09-03 ENCOUNTER — Encounter: Payer: Self-pay | Admitting: Internal Medicine

## 2020-09-03 ENCOUNTER — Other Ambulatory Visit (HOSPITAL_COMMUNITY): Payer: Self-pay

## 2020-09-03 ENCOUNTER — Other Ambulatory Visit: Payer: Self-pay

## 2020-09-03 VITALS — BP 130/88 | HR 65 | Temp 98.0°F | Resp 18 | Ht 75.0 in | Wt 183.8 lb

## 2020-09-03 DIAGNOSIS — I471 Supraventricular tachycardia, unspecified: Secondary | ICD-10-CM

## 2020-09-03 DIAGNOSIS — Z1159 Encounter for screening for other viral diseases: Secondary | ICD-10-CM

## 2020-09-03 DIAGNOSIS — G250 Essential tremor: Secondary | ICD-10-CM | POA: Diagnosis not present

## 2020-09-03 DIAGNOSIS — I10 Essential (primary) hypertension: Secondary | ICD-10-CM

## 2020-09-03 DIAGNOSIS — J4550 Severe persistent asthma, uncomplicated: Secondary | ICD-10-CM | POA: Diagnosis not present

## 2020-09-03 DIAGNOSIS — Z131 Encounter for screening for diabetes mellitus: Secondary | ICD-10-CM

## 2020-09-03 NOTE — Assessment & Plan Note (Signed)
On primidone and Artane Follows up with neurologist

## 2020-09-03 NOTE — Assessment & Plan Note (Signed)
Uses Breztri and Xopenex as needed On Dupixent Follows up with Pulmonologist

## 2020-09-03 NOTE — Progress Notes (Signed)
Established Patient Office Visit  Subjective:  Patient ID: Drew Sandoval, male    DOB: August 20, 1975  Age: 45 y.o. MRN: [id number]  CC:  Chief Complaint  Patient presents with  . Follow-up    2 month follow up     HPI TRUE Drew Sandoval is a 45 year old male with past medical history of severe persistent asthma, chronic sinusitis, hyper IgE syndrome, SVT, hypertension and essential tremor who presents for follow up of HTN.  He had a visit with Cardiologist for HTN and SVT. He was placed on Metoprolol instead of Olmesartan-HCTZ. He currently denies any headache, dizziness, chest pain, dyspnea or palpitations.  He had Neurology visit for tremors, for which he takes Primidone and Artane.  Past Medical History:  Diagnosis Date  . Acute conjunctivitis of right eye 10/07/2014  . Acute respiratory failure with hypoxia ([place]) 07/23/2013  . Asthma   . Asthma    Phreesia 06/26/2020  . Asthma, severe persistent 07/16/2010   PFT >> spiro 2012 with FEV1 1.14 (28%), ratio 45 Alpha 1 (08/12/10) >>MM ([facility])  IGE 1747, positive allergy profile >> Elevated eos on CBC S/p intubation 10/2010 Job exposure to respiratory irritant - bleach, chicken Allergy vaccine begun 11/09/2010. DC'd by pt 2013- resumed 2013> 1:50 [facility]; [place] 9/24-9/27/ 2013- Status asthma, no vent. [facility] 3/ 2015- status ast  . COVID-19 virus infection 06/17/2020   January, 2022. No hosp, no MAB  . Eczema, allergic 07/22/2011  . Essential tremor 06/27/2020  . Seasonal allergies   . Seasonal and perennial allergic rhinitis 07/17/2014   FENO 03/18/16- 100  High, indicates allergic asthma  . Sinusitis, maxillary, 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   Sinus CT 12/10/2013 >>>   . Status asthmaticus 10/19/2010  . Sustained SVT ([place]) 09/24/2014  . SVT (supraventricular tachycardia) ([name]) 09/24/2014    Past Surgical History:  Procedure Laterality Date  . None    . SVT ABLATION N/A 09/07/2019   Procedure: SVT ABLATION;  Surgeon:  Evans Lance, MD;  Location: [facility];  Service: Cardiovascular;  Laterality: N/A;    Family History  Problem Relation Age of Onset  . Emphysema Father   . Lung cancer Mother   . Arthritis Sister     Social History   Socioeconomic History  . Marital status: Single    Spouse name: Not on file  . Number of children: Not on file  . Years of education: Not on file  . Highest education level: Not on file  Occupational History  . Occupation: Financial trader: [employer]  Tobacco Use  . Smoking status: Former Smoker    Packs/day: 0.50    Years: 11.50    Pack years: 5.75    Types: Cigarettes    Quit date: 04/09/2010    Years since quitting: 10.4  . Smokeless tobacco: Former Systems developer  . Tobacco comment: started at age 56.  1/2 ppd.    Vaping Use  . Vaping Use: Never used  Substance and Sexual Activity  . Alcohol use: Yes    Comment: rarely  . Drug use: No  . Sexual activity: Yes    Birth control/protection: None  Other Topics Concern  . Not on file  Social History Narrative   Left handed   Two story home   Drinks caffeine   Social Determinants of Health   Financial Resource Strain: Not on file  Food Insecurity: Not on file  Transportation Needs: Not on file  Physical Activity:  Not on file  Stress: Not on file  Social Connections: Not on file  Intimate Partner Violence: Not on file    Outpatient Medications Prior to Visit  Medication Sig Dispense Refill  . albuterol (VENTOLIN HFA) 108 (90 Base) MCG/ACT inhaler INHALE 2 PUFFS BY MOUTH EVERY 4 HOURS AS NEEDED 1 each 5  . Budeson-Glycopyrrol-Formoterol (BREZTRI AEROSPHERE) 160-9-4.8 MCG/ACT AERO Inhale 2 puffs into the lungs 2 (two) times daily. Rinse mouth 10.7 g 11  . cetirizine (ZYRTEC ALLERGY) 10 MG tablet Take 1 tablet (10 mg total) by mouth daily. 30 tablet 0  . Dupilumab 300 MG/2ML SOPN INJECT 300 MG INTO THE SKIN EVERY 14 (FOURTEEN) DAYS. 4 mL 11  . EPINEPHrine 0.3 mg/0.3 mL IJ SOAJ injection  Inject into thigh for severe allergic reaction 1 Device prn  . levalbuterol (XOPENEX) 0.31 MG/3ML nebulizer solution Take 3 mLs (0.31 mg total) by nebulization every 4 (four) hours as needed for wheezing. 75 mL 12  . metoprolol succinate (TOPROL XL) 25 MG 24 hr tablet Take 1 tablet (25 mg total) by mouth daily. 90 tablet 3  . montelukast (SINGULAIR) 10 MG tablet TAKE 1 TABLET BY MOUTH EVERYDAY AT BEDTIME 90 tablet 3  . primidone (MYSOLINE) 50 MG tablet 3 in the AM and 2 at night thereafter 450 tablet 1  . trihexyphenidyl (ARTANE) 2 MG tablet Take 1 tablet (2 mg total) by mouth 2 (two) times daily with a meal. Take with breakfast and lunch 180 tablet 1   No facility-administered medications prior to visit.    Allergies  Allergen Reactions  . Azithromycin Rash  . Alvesco [Ciclesonide]     Possible reaction- rash  . Anoro Ellipta [Umeclidinium-Vilanterol]     Possible- rash  . Banana Swelling    Throat swelling   . Methylprednisolone Other (See Comments)    unknown  . Penicillins Rash    Has patient had a PCN reaction causing immediate rash, facial/tongue/throat swelling, SOB or lightheadedness with hypotension: Yes Has patient had a PCN reaction causing severe rash involving mucus membranes or skin necrosis: No Has patient had a PCN reaction that required hospitalization Yes Has patient had a PCN reaction occurring within the last 10 years: No If all of the above answers are "NO", then may proceed with Cephalosporin use.      ROS Review of Systems  Constitutional: Negative for chills and fever.  HENT: Negative for congestion, postnasal drip, sinus pressure, sinus pain and sore throat.   Eyes: Negative for pain and discharge.  Respiratory: Negative for cough and shortness of breath.   Cardiovascular: Negative for chest pain and palpitations.  Gastrointestinal: Negative for constipation, diarrhea, nausea and vomiting.  Endocrine: Negative for polydipsia and polyuria.   Genitourinary: Negative for dysuria and hematuria.  Musculoskeletal: Negative for neck pain and neck stiffness.  Skin: Negative for rash.  Neurological: Positive for tremors and headaches. Negative for dizziness, weakness and numbness.  Psychiatric/Behavioral: Negative for agitation and behavioral problems.      Objective:    Physical Exam Vitals reviewed.  Constitutional:      General: He is not in acute distress.    Appearance: He is not diaphoretic.  HENT:     Head: Normocephalic and atraumatic.     Nose: No congestion.     Mouth/Throat:     Mouth: Mucous membranes are moist.  Eyes:     General: No scleral icterus.    Extraocular Movements: Extraocular movements intact.     Pupils: Pupils are  equal, round, and reactive to light.  Cardiovascular:     Rate and Rhythm: Normal rate and regular rhythm.     Pulses: Normal pulses.     Heart sounds: Normal heart sounds. No murmur heard.   Pulmonary:     Breath sounds: Normal breath sounds. No wheezing or rales.  Abdominal:     Palpations: Abdomen is soft.     Tenderness: There is no abdominal tenderness.  Musculoskeletal:     Cervical back: Neck supple. No tenderness.     Right lower leg: No edema.     Left lower leg: No edema.  Skin:    General: Skin is warm.     Findings: No rash.  Neurological:     General: No focal deficit present.     Mental Status: He is alert and oriented to person, place, and time.     Sensory: No sensory deficit.     Motor: No weakness.  Psychiatric:        Mood and Affect: Mood normal.        Behavior: Behavior normal.     BP 130/88 (BP Location: Right Arm, Patient Position: Sitting, Cuff Size: Normal)   Pulse 65   Temp 98 F (36.7 C) (Oral)   Resp 18   Ht 6' 3" (1.905 m)   Wt 183 lb 12.8 oz (83.4 kg)   SpO2 98%   BMI 22.97 kg/m  Wt Readings from Last 3 Encounters:  09/03/20 183 lb 12.8 oz (83.4 kg)  08/18/20 180 lb (81.6 kg)  07/10/20 176 lb 6.4 oz (80 kg)     Health  Maintenance Due  Topic Date Due  . Hepatitis C Screening  Never done  . TETANUS/TDAP  Never done  . COVID-19 Vaccine (3 - Booster for Pfizer series) 04/26/2020    There are no preventive care reminders to display for this patient.  No results found for: TSH Lab Results  Component Value Date   WBC 8.3 10/16/2019   HGB 13.8 10/16/2019   HCT 41.4 10/16/2019   MCV 89.4 10/16/2019   PLT 224.0 10/16/2019   Lab Results  Component Value Date   NA 140 07/02/2020   K 3.7 07/02/2020   CO2 24 07/02/2020   GLUCOSE 84 07/02/2020   BUN 16 07/02/2020   CREATININE 0.82 07/02/2020   BILITOT 0.2 07/02/2020   ALKPHOS 70 07/02/2020   AST 17 07/02/2020   ALT 12 07/02/2020   PROT 7.4 07/02/2020   ALBUMIN 4.0 07/02/2020   CALCIUM 9.0 07/02/2020   ANIONGAP 7 09/04/2019   GFR 106.88 07/02/2020   No results found for: CHOL No results found for: HDL No results found for: LDLCALC No results found for: TRIG No results found for: CHOLHDL No results found for: HGBA1C    Assessment & Plan:   Problem List Items Addressed This Visit      Cardiovascular and Mediastinum   SVT (supraventricular tachycardia) ([place])    S/p ablation On Metoprolol F/u with Cardiology      Hypertension, essential - Primary    BP Readings from Last 1 Encounters:  09/03/20 130/88   Well-controlled with Metoprolol Olmesartan-HCTZ discontinued by Cardiology as patient was having headache Counseled for compliance with the medications Advised DASH diet and moderate exercise/walking, at least 150 mins/week       Relevant Orders   CBC with Differential/Platelet   CMP14+EGFR   Lipid panel   TSH     Respiratory   Asthma, severe persistent  Uses Breztri and Xopenex as needed On Dupixent Follows up with Pulmonologist        Nervous and Auditory   Essential tremor    On primidone and Artane Follows up with neurologist      Relevant Orders   Vitamin D (25 hydroxy)    Other Visit Diagnoses    Need  for hepatitis C screening test       Relevant Orders   Hepatitis C Antibody   Screening for diabetes mellitus (DM)       Relevant Orders   Hemoglobin A1c      No orders of the defined types were placed in this encounter.   Follow-up: Return in about 6 months (around 03/05/2021) for Annual physical.    Lindell Spar, MD

## 2020-09-03 NOTE — Assessment & Plan Note (Signed)
BP Readings from Last 1 Encounters:  09/03/20 130/88   Well-controlled with Metoprolol Olmesartan-HCTZ discontinued by Cardiology as patient was having headache Counseled for compliance with the medications Advised DASH diet and moderate exercise/walking, at least 150 mins/week

## 2020-09-03 NOTE — Patient Instructions (Signed)
Please continue taking medications as prescribed.  Please follow low salt diet and perform moderate exercise/walking at least 150 mins/week.  Please get fasting blood tests done before the next visit.

## 2020-09-03 NOTE — Assessment & Plan Note (Signed)
S/p ablation On Metoprolol F/u with Cardiology

## 2020-09-16 ENCOUNTER — Ambulatory Visit: Payer: BC Managed Care – PPO | Admitting: Internal Medicine

## 2020-09-30 ENCOUNTER — Other Ambulatory Visit (HOSPITAL_COMMUNITY): Payer: Self-pay

## 2020-09-30 MED FILL — Dupilumab Subcutaneous Soln Auto-injector 300 MG/2ML: SUBCUTANEOUS | 28 days supply | Qty: 4 | Fill #1 | Status: AC

## 2020-10-01 ENCOUNTER — Other Ambulatory Visit (HOSPITAL_COMMUNITY): Payer: Self-pay

## 2020-10-10 ENCOUNTER — Ambulatory Visit: Payer: BC Managed Care – PPO | Admitting: Internal Medicine

## 2020-10-27 ENCOUNTER — Other Ambulatory Visit (HOSPITAL_COMMUNITY): Payer: Self-pay

## 2020-10-27 MED FILL — Dupilumab Subcutaneous Soln Auto-injector 300 MG/2ML: SUBCUTANEOUS | 28 days supply | Qty: 4 | Fill #2 | Status: AC

## 2020-10-29 ENCOUNTER — Other Ambulatory Visit (HOSPITAL_COMMUNITY): Payer: Self-pay

## 2020-11-06 ENCOUNTER — Ambulatory Visit: Payer: BC Managed Care – PPO | Admitting: Internal Medicine

## 2020-11-12 ENCOUNTER — Other Ambulatory Visit: Payer: Self-pay

## 2020-11-12 ENCOUNTER — Other Ambulatory Visit (HOSPITAL_COMMUNITY): Payer: Self-pay

## 2020-11-12 ENCOUNTER — Encounter (HOSPITAL_COMMUNITY): Payer: Self-pay

## 2020-11-12 ENCOUNTER — Emergency Department (HOSPITAL_COMMUNITY): Payer: BC Managed Care – PPO

## 2020-11-12 ENCOUNTER — Emergency Department (HOSPITAL_COMMUNITY)
Admission: EM | Admit: 2020-11-12 | Discharge: 2020-11-12 | Disposition: A | Discharge status: Home / Self Care | Payer: BC Managed Care – PPO | Attending: Emergency Medicine | Admitting: Emergency Medicine

## 2020-11-12 DIAGNOSIS — J45909 Unspecified asthma, uncomplicated: Secondary | ICD-10-CM | POA: Diagnosis not present

## 2020-11-12 DIAGNOSIS — R220 Localized swelling, mass and lump, head: Secondary | ICD-10-CM | POA: Diagnosis not present

## 2020-11-12 DIAGNOSIS — E042 Nontoxic multinodular goiter: Secondary | ICD-10-CM | POA: Diagnosis not present

## 2020-11-12 DIAGNOSIS — Z79899 Other long term (current) drug therapy: Secondary | ICD-10-CM | POA: Diagnosis not present

## 2020-11-12 DIAGNOSIS — Z87891 Personal history of nicotine dependence: Secondary | ICD-10-CM | POA: Insufficient documentation

## 2020-11-12 DIAGNOSIS — R221 Localized swelling, mass and lump, neck: Secondary | ICD-10-CM | POA: Insufficient documentation

## 2020-11-12 DIAGNOSIS — I10 Essential (primary) hypertension: Secondary | ICD-10-CM | POA: Diagnosis not present

## 2020-11-12 DIAGNOSIS — Z8616 Personal history of COVID-19: Secondary | ICD-10-CM | POA: Diagnosis not present

## 2020-11-12 DIAGNOSIS — R001 Bradycardia, unspecified: Secondary | ICD-10-CM | POA: Diagnosis not present

## 2020-11-12 DIAGNOSIS — K115 Sialolithiasis: Secondary | ICD-10-CM

## 2020-11-12 LAB — CBC WITH DIFFERENTIAL/PLATELET
Abs Immature Granulocytes: 0.03 10*3/uL (ref 0.00–0.07)
Basophils Absolute: 0.1 10*3/uL (ref 0.0–0.1)
Basophils Relative: 1 %
Eosinophils Absolute: 1.8 10*3/uL — ABNORMAL HIGH (ref 0.0–0.5)
Eosinophils Relative: 21 %
HCT: 40.2 % (ref 39.0–52.0)
Hemoglobin: 13.3 g/dL (ref 13.0–17.0)
Immature Granulocytes: 0 %
Lymphocytes Relative: 22 %
Lymphs Abs: 1.9 10*3/uL (ref 0.7–4.0)
MCH: 30.7 pg (ref 26.0–34.0)
MCHC: 33.1 g/dL (ref 30.0–36.0)
MCV: 92.8 fL (ref 80.0–100.0)
Monocytes Absolute: 0.7 10*3/uL (ref 0.1–1.0)
Monocytes Relative: 8 %
Neutro Abs: 4.3 10*3/uL (ref 1.7–7.7)
Neutrophils Relative %: 48 %
Platelets: 211 10*3/uL (ref 150–400)
RBC: 4.33 MIL/uL (ref 4.22–5.81)
RDW: 13.9 % (ref 11.5–15.5)
WBC: 8.8 10*3/uL (ref 4.0–10.5)
nRBC: 0 % (ref 0.0–0.2)

## 2020-11-12 LAB — BASIC METABOLIC PANEL
Anion gap: 7 (ref 5–15)
BUN: 13 mg/dL (ref 6–20)
CO2: 25 mmol/L (ref 22–32)
Calcium: 8.8 mg/dL — ABNORMAL LOW (ref 8.9–10.3)
Chloride: 108 mmol/L (ref 98–111)
Creatinine, Ser: 0.87 mg/dL (ref 0.61–1.24)
GFR, Estimated: >60 mL/min (ref >60)
Glucose, Bld: 79 mg/dL (ref 70–99)
Potassium: 3.7 mmol/L (ref 3.5–5.1)
Sodium: 140 mmol/L (ref 135–145)

## 2020-11-12 MED ORDER — SODIUM CHLORIDE 0.9 % IV BOLUS
1000.0000 mL | Freq: Once | INTRAVENOUS | Status: AC
  Administered 2020-11-12: 1000 mL via INTRAVENOUS

## 2020-11-12 MED ORDER — IOHEXOL 300 MG/ML  SOLN
75.0000 mL | Freq: Once | INTRAMUSCULAR | Status: AC | PRN
  Administered 2020-11-12: 75 mL via INTRAVENOUS

## 2020-11-12 MED ORDER — LIDOCAINE VISCOUS HCL 2 % MT SOLN: 15.0000 mL | OROMUCOSAL | 0 refills | Status: DC | PRN

## 2020-11-12 MED ORDER — MORPHINE SULFATE (PF) 4 MG/ML IV SOLN: 4.0000 mg | Freq: Once | INTRAVENOUS | Status: DC

## 2020-11-12 MED ORDER — FENTANYL CITRATE (PF) 100 MCG/2ML IJ SOLN
50.0000 ug | Freq: Once | INTRAMUSCULAR | Status: AC
  Administered 2020-11-12: 50 ug via INTRAVENOUS
  Filled 2020-11-12: qty 2

## 2020-11-12 MED ORDER — CLINDAMYCIN HCL 150 MG PO CAPS: 150.0000 mg | ORAL_CAPSULE | Freq: Four times a day (QID) | ORAL | 0 refills | Status: DC

## 2020-11-12 MED ORDER — ONDANSETRON HCL 4 MG/2ML IJ SOLN
4.0000 mg | Freq: Once | INTRAMUSCULAR | Status: AC
  Administered 2020-11-12: 4 mg via INTRAVENOUS
  Filled 2020-11-12: qty 2

## 2020-11-12 MED ORDER — CLINDAMYCIN PHOSPHATE 600 MG/50ML IV SOLN
600.0000 mg | Freq: Once | INTRAVENOUS | Status: AC
  Administered 2020-11-12: 600 mg via INTRAVENOUS
  Filled 2020-11-12: qty 50

## 2020-11-12 NOTE — ED Triage Notes (Addendum)
Pt. Has facial swelling left side under left ear. Pt. States they also have left ear pain. Pt. Denies shortness of breath. Pt. Also states they have dental pain on the left side of face.

## 2020-11-12 NOTE — ED Provider Notes (Signed)
Received signout from previous provider, please see her note for complete H&P.  Patient here with complaints of left-sided facial swelling, dental pain, and having trouble opening his mouth secondary to the pain.  Suspect infectious etiology, he is currently receiving IV antibiotic, CT scan of the neck is currently pending.  Pain medication given.  10:09 PM CT scan obtained demonstrating an 8 mm stone in the left parotid duct with moderate ductal dilatation and inflammatory changes to the left parotid gland.  This finding is consistent with patient's presenting complaint.  I discussed care with patient, and recommend to suck on lemon drops, take antibiotic, and follow-up with ENT for further care.  Care discussed with Dr. Hyacinth Meeker.  At this time patient voiced understanding and agrees with plan.  He is stable for discharge.  Return precaution given.   CLINICAL DATA: Neck swelling  EXAM: CT NECK WITH CONTRAST  TECHNIQUE: Multidetector CT imaging of the neck was performed using the standard protocol following the bolus administration of intravenous contrast.  CONTRAST: 60mL OMNIPAQUE IOHEXOL 300 MG/ML SOLN  COMPARISON: None.  FINDINGS: PHARYNX AND LARYNX: The nasopharynx, oropharynx and larynx are normal. Visible portions of the oral cavity, tongue base and floor of mouth are normal. Normal epiglottis, vallecula and pyriform sinuses. The larynx is normal. No retropharyngeal abscess, effusion or lymphadenopathy.  SALIVARY GLANDS: There is an 8 mm stone in the left parotid duct with moderate ductal dilatation and inflammatory change of the left parotid gland. The other salivary glands are normal.  THYROID: Enlarged and heterogeneous thyroid with dominant nodule in the left lobe measuring 3.2 cm.  LYMPH NODES: No enlarged or abnormal density lymph nodes.  VASCULAR: Major cervical vessels are patent.  LIMITED INTRACRANIAL: Normal.  VISUALIZED ORBITS: Normal.  MASTOIDS AND VISUALIZED  PARANASAL SINUSES: No fluid levels or advanced mucosal thickening. No mastoid effusion.  SKELETON: No bony spinal canal stenosis. No lytic or blastic lesions.  UPPER CHEST: Clear.  OTHER: None.  IMPRESSION: 1. Obstructing 8 mm stone in the left parotid duct with inflammation of the left parotid gland. 2. Enlarged and heterogeneous thyroid with dominant nodule in the left lobe measuring 3.2 cm. Recommend thyroid US (ref: J Am Coll Radiol. 2015 Feb;12(2): 143-50).   Electronically Signed By: Deatra Robinson M.D. On: 11/12/2020 19:48   Fayrene Helper, PA-C 11/12/20 2242    Eber Hong, MD 11/13/20 1250

## 2020-11-12 NOTE — ED Provider Notes (Signed)
[facility] EMERGENCY DEPARTMENT Provider Note   CSN: [id number] Arrival date & time: 11/12/20  1649     History Chief Complaint  Patient presents with   Facial Swelling    Drew Sandoval is a 45 y.o. male with a history of asthma, prior SVT, and hypertension who presents to the emergency department with complaints of left-sided neck/facial swelling that developed last night.  Patient states he is having pain and swelling to his left jaw extending into his neck into his ear, this is progressively worsening, it is aggravated when he tries to open his mouth, no alleviating factors.  He has no history of similar.  He denies fever, chills, dysphagia, dyspnea, vomiting, intraoral drainage, ear drainage, or dental pain.  HPI     Past Medical History:  Diagnosis Date   Acute conjunctivitis of right eye 10/07/2014   Acute respiratory failure with hypoxia (HCC) 07/23/2013   Asthma    Asthma    Phreesia 06/26/2020   Asthma, severe persistent 07/16/2010   PFT >> spiro 2012 with FEV1 1.14 (28%), ratio 45 Alpha 1 (08/12/10) >>MM ([employer] [facility])  IGE 1747, positive allergy profile >> Elevated eos on CBC S/p intubation 10/2010 Job exposure to respiratory irritant - bleach, chicken Allergy vaccine begun 11/09/2010. DC'Sandoval by pt 2013- resumed 2013> 1:50 GH; DC'Sandoval [facility] 9/24-9/27/ 2013- Status asthma, no vent. [facility] 3/ 2015- status ast   COVID-19 virus infection 06/17/2020   January, 2022. No hosp, no MAB   Eczema, allergic 07/22/2011   Essential tremor 06/27/2020   Seasonal allergies    Seasonal and perennial allergic rhinitis 07/17/2014   FENO 03/18/16- 100  High, indicates allergic asthma   Sinusitis, maxillary, 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   Sinus CT 12/10/2013 >>>    Status asthmaticus 10/19/2010   Sustained SVT (HCC) 09/24/2014   SVT (supraventricular tachycardia) (HCC) 09/24/2014    Patient Active Problem List   Diagnosis Date Noted   Essential tremor 06/27/2020   Hypertension, essential  06/17/2020   Gynecomastia, male 04/24/2018   Sustained SVT (HCC) 09/24/2014   SVT (supraventricular tachycardia) (HCC) 09/24/2014   Malnutrition of moderate degree (HCC) 09/24/2014   Seasonal and perennial allergic rhinitis 07/17/2014   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   Eczema, allergic 07/22/2011   Sinusitis, maxillary, 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   Asthma, severe persistent 07/16/2010    Past Surgical History:  Procedure Laterality Date   None     SVT ABLATION N/A 09/07/2019   Procedure: SVT ABLATION;  Surgeon: Drew Maw, MD;  Location: [facility] INVASIVE CV LAB;  Service: Cardiovascular;  Laterality: N/A;       Family History  Problem Relation Age of Onset   Emphysema Father    Lung cancer Mother    Arthritis Sister     Social History   Tobacco Use   Smoking status: Former    Packs/day: 0.50    Years: 11.50    Pack years: 5.75    Types: Cigarettes    Quit date: 04/09/2010    Years since quitting: 10.6   Smokeless tobacco: Former   Tobacco comments:    started at age 27.  1/2 ppd.    Vaping Use   Vaping Use: Never used  Substance Use Topics   Alcohol use: Yes    Comment: rarely   Drug use: No    Home Medications Prior to Admission medications   Medication Sig Start Date End Date Taking? Authorizing Provider  albuterol (VENTOLIN HFA)  108 (90 Base) MCG/ACT inhaler INHALE 2 PUFFS BY MOUTH EVERY 4 HOURS AS NEEDED 01/03/20   Drew Duhamel D, MD  Budeson-Glycopyrrol-Formoterol (BREZTRI AEROSPHERE) 160-9-4.8 MCG/ACT AERO Inhale 2 puffs into the lungs 2 (two) times daily. Rinse mouth 03/28/20   Drew Duhamel D, MD  cetirizine (ZYRTEC ALLERGY) 10 MG tablet Take 1 tablet (10 mg total) by mouth daily. 05/21/20   Drew Cipro, NP  Dupilumab 300 MG/2ML SOPN INJECT 300 MG INTO THE SKIN EVERY 14 (FOURTEEN) DAYS. 06/16/20 06/16/21  Drew Duhamel D, MD  EPINEPHrine 0.3 mg/0.3 mL IJ SOAJ injection Inject into thigh for severe allergic reaction  04/29/15   Drew Duhamel D, MD  levalbuterol (XOPENEX) 0.31 MG/3ML nebulizer solution Take 3 mLs (0.31 mg total) by nebulization every 4 (four) hours as needed for wheezing. 07/16/19   Drew Duhamel D, MD  metoprolol succinate (TOPROL XL) 25 MG 24 hr tablet Take 1 tablet (25 mg total) by mouth daily. 07/10/20   Drew Maw, MD  montelukast (SINGULAIR) 10 MG tablet TAKE 1 TABLET BY MOUTH EVERYDAY AT BEDTIME 02/06/20   Drew Duhamel D, MD  primidone (MYSOLINE) 50 MG tablet 3 in the AM and 2 at night thereafter 02/14/20   Tat, Drew Batty, DO  trihexyphenidyl (ARTANE) 2 MG tablet Take 1 tablet (2 mg total) by mouth 2 (two) times daily with a meal. Take with breakfast and lunch 08/18/20   Tat, Drew Batty, DO    Allergies    Azithromycin, Alvesco [ciclesonide], Anoro ellipta [umeclidinium-vilanterol], Banana, Methylprednisolone, and Penicillins  Review of Systems   Review of Systems  Constitutional:  Negative for chills and fever.  HENT:  Positive for facial swelling. Negative for congestion, ear discharge and trouble swallowing.        Positive for left-sided jaw pain.  Respiratory:  Negative for shortness of breath.   Cardiovascular:  Negative for chest pain.  Gastrointestinal:  Negative for abdominal pain and vomiting.  All other systems reviewed and are negative.  Physical Exam Updated Vital Signs BP (!) 143/68 (BP Location: Right Arm)   Pulse 61   Temp 98.5 F (36.9 C) (Oral)   Resp 15   Ht 6\' 3"  (1.905 m)   Wt 83.9 kg   SpO2 95%   BMI 23.12 kg/m   Physical Exam Vitals and nursing note reviewed.  Constitutional:      General: He is not in acute distress.    Appearance: He is well-developed. He is not toxic-appearing.  HENT:     Head: Normocephalic and atraumatic.     Ears:     Comments: Nonobstructing cerumen present in bilateral EACs.  Visualized portion of the TMs without erythema, bulging, or retraction, or perforation.  No mastoid erythema.    Nose: Nose normal.      Mouth/Throat:     Comments: Patient has trismus, able to open the mouth approximately 1 to 2 fingerbreadths.  Visualized posterior oropharynx is clear.  Patient has poor dentition throughout, however there is no obvious gingival swelling, fluctuance, or tenderness.  Patient is tolerating his own secretions without difficulty. Eyes:     General:        Right eye: No discharge.        Left eye: No discharge.     Conjunctiva/sclera: Conjunctivae normal.  Neck:     Comments: Patient has notable swelling and tenderness to palpation to the left TMJ area extending into the anterior neck including somewhat to the submandibular space as well as somewhat to  the posterior neck.  There is no overlying erythema.  Cardiovascular:     Rate and Rhythm: Normal rate and regular rhythm.  Pulmonary:     Effort: Pulmonary effort is normal. No respiratory distress.     Breath sounds: Normal breath sounds. No wheezing, rhonchi or rales.  Abdominal:     General: There is no distension.     Palpations: Abdomen is soft.     Tenderness: There is no abdominal tenderness.  Musculoskeletal:     Cervical back: Neck supple.  Skin:    General: Skin is warm and dry.     Findings: No rash.  Neurological:     Mental Status: He is alert.     Comments: Clear speech.   Psychiatric:        Behavior: Behavior normal.    ED Results / Procedures / Treatments   Labs (all labs ordered are listed, but only abnormal results are displayed) Labs Reviewed - No data to display  EKG None  Radiology No results found.  Procedures Procedures   Medications Ordered in ED Medications - No data to display  ED Course  I have reviewed the triage vital signs and the nursing notes.  Pertinent labs & imaging results that were available during my care of the patient were reviewed by me and considered in my medical decision making (see chart for details).    MDM Rules/Calculators/A&P                          Patient presents  to the ED with complaints of left-sided facial/neck swelling that began last night.  Patient is nontoxic, vitals with somewhat elevated blood pressure.  On exam patient does have very notable swelling to the left TMJ extending into the anterior and posterior neck with tenderness to palpation.  Tenderness extends to the left submandibular space.  Patient has trismus.  No obvious palpable dental abscess, however with trismus exam is somewhat limited, as well as posterior oropharynx is clear, tolerating own secretions with patent airway at this time.  No obvious AOM or AOE.  Given degree and location of swelling we will plan for basic labs and CT soft tissue with contrast.  Morphine ordered for pain and Zofran for potential nausea.  Additional history obtained:  Additional history obtained from chart review & nursing note review.   Lab Tests:  I Ordered, reviewed, and interpreted labs, which included:  CBC: Unremarkable.  BMP: Pending.  Imaging Studies ordered:  I ordered imaging studies which included CT soft tissue neck- pending.   ED Course:  Per nursing HR in the 40s- will check EKG, no chest pain, dyspnea, or lightheadedness. Sinus bradycardia noted.   Abx initiated to cover for infectious process.   18:58: Patient care signed out to Fayrene Helper PA-C at change of shift pending remaining work-up and disposition.   Portions of this note were generated with Scientist, clinical (histocompatibility and immunogenetics). Dictation errors may occur despite best attempts at proofreading.  Final Clinical Impression(s) / ED Diagnoses Final diagnoses:  None    Rx / DC Orders ED Discharge Orders     None        Desmond Lope 11/12/20 1858    Eber Hong, MD 11/13/20 1249

## 2020-11-12 NOTE — ED Notes (Signed)
PA notified re: pts HR, EKG ordered medication changed to Fentanyl for pain management

## 2020-11-12 NOTE — Discharge Instructions (Addendum)
You have been diagnosed with having a stone blocking your left parotid gland causing facial swelling.  Please take antibiotic as prescribed.  Suck on lemon drops regularly as it will help dissolve the stone.  When pain is significant, you may gargle mouth with viscous lidocaine for comfort.  Follow-up with ear nose and throat specialist for further care as needed.  Return if you have any concern.

## 2020-11-17 NOTE — Progress Notes (Signed)
Patient ID: Drew Sandoval, male    DOB: Mar 13, 1976    MRN: [id number]  HPI M  former smoker followed for severe asthma/ COPD, chronic sinusitis, hyper-eosinophilia and hyper IgE, complicated by history SVT/cardioversion FENO 03/18/16- 100  High, indicates allergic asthma Office Spirometry 03/18/2016-severe obstructive airways disease FVC 2.62/51%, FEV1 1.30/31%, FEV1/FVC 0.50, FEF 25-75 0.61/14% Office Spirometry 01/25/17-severe obstructive airways disease. FVC 3.52/68%, FEV1 1.80/43%, ratio or 0.51, FEF 25-75% 0.76/18% IgE was too high for Xolair. He could not afford Nucala or allergy vaccine. Cinqair infusion@ [facility]- ordered 07/21/17 ----------------------------------------------------  06/17/20- 45 year old male former smoker followed for severe asthma/ COPD/ Cinqair, chronic sinusitis, persistent eosinophilia, hyper IgE-too high for Xolair, history SVT/cardioversion/ ablation, Kidney stone, Covid infection Jan, 2022,  Breztri, Dupixent, Singulair, Ventolin hfa, neb albuterol  Urgent care 1/12 for URI and shoulder pain> meloxicam, otc antihistmines/ deccongestants Started Dupixent every 2 weeks. Covid vax- 2 [facility] Flu vax- -----Patient states he got Covid Jan 12th, states that he is having some shortness of breath since covid. No cough ED visit jan 12  + Covid. Went for shoulder pain and tested while there. Developed flu-like symptoms over next week. No infusion, no hosp. Now residual SOB more than baseline. Asthma has been well controlled on Dupixent. Little cough, no chest pain, no leg discomfort or swelling. Says BP running high since then.  Unable to find PCP in [facility] system in [place] area who is taking new patients. Has been noting rapid heart rate (hx ablation for SVT) and intends to f/u with his cardiologist. Continues Dupixent. BP high on arrival 170/90- no PCP PFT 12/19/19- ordered, not done  11/18/20- 45 year old male former smoker (5.75 pk yrs) followed for severe asthma/  COPD/ Dupixent, chronic sinusitis, persistent eosinophilia, hyper IgE-too high for Xolair, history SVT/cardioversion/ ablation, Kidney stone, Covid infection Jan, 2022, HTN,  -Breztri, Dupixent, Singulair, Ventolin hfa, neb albuterol Covid vax-2 [facility] ED 11/12/20- L parotid stone Lab- 11/12/20- Eos H 1.8 K/ul ACT score- 24 -----Doing well on dupixent Not needing to use any inhalers lately and feels very well controlled on Dupixent. This is dramatically better than in past.  Hypereosinophilia persists on recent lab, without associated changes. Incidental thyroid nodule noted on recent CT neck for parotid stone. Parotid pain much improved as he continues abx. He was unaware of thyroid nodule.  CXR 06/17/20- PRESSION: No acute process in the chest.   ROS-see HPI  "+" = positive Constitutional:    weight loss, night sweats, fevers, chills, fatigue, lassitude. HEENT:    headaches, difficulty swallowing, tooth/dental problems, sore throat,       sneezing, itching, ear ache, +nasal congestion, +post nasal drip, snoring CV:    chest pain, orthopnea, PND, swelling in lower extremities, anasarca,                                                      dizziness, +palpitations Resp:   +shortness of breath with exertion or at rest.                productive cough,  non-productive cough, coughing up of blood.              change in color of mucus.  +wheezing.   Skin:    rash or lesions. GI:  No-   heartburn, indigestion, abdominal pain, nausea, vomiting,  GU:  d. MS:   joint pain, stiffness, decreased range of motion, back pain. Neuro-     nothing unusual Psych:  change in mood or affect.  depression or anxiety.   memory loss.  Objective:   Physical Exam General- Alert, Oriented, Affect-reserved/ laconic, Distress-none acute;  Tall, thin Skin- +tatoos Lymphadenopathy- none Head- atraumatic            Eyes- Gross vision intact, PERRLA, conjunctivae clear secretions            Ears- Hearing, canals  normal            Nose- Clear, no-Septal dev, mucus, polyps, erosion, perforation             Throat- Mallampati II , mucosa clear , drainage- none, tonsils- atrophic Neck- flexible , trachea midline, no stridor , thyroid nl, carotid no bruit Chest - symmetrical excursion , unlabored           Heart/CV- RRR  , no murmur , no gallop  , no rub, nl s1 s2                           - JVD- none , edema- none, stasis changes- none, varices- none           Lung-   +Diminished/ clear, unlabored,  Cough-none, dullness-none, rub- none,  Wheeze-none Abd- scaphoid  Br/ Gen/ Rectal- Not done, not indicated Extrem- cyanosis- none, clubbing, none, atrophy- none, strength- nl Neuro- unremarkable

## 2020-11-18 ENCOUNTER — Other Ambulatory Visit: Payer: Self-pay

## 2020-11-18 ENCOUNTER — Ambulatory Visit: Payer: BC Managed Care – PPO | Admitting: Internal Medicine

## 2020-11-18 ENCOUNTER — Encounter: Payer: Self-pay | Admitting: Internal Medicine

## 2020-11-18 VITALS — BP 122/78 | HR 68 | Temp 97.3°F | Ht 75.0 in | Wt 175.8 lb

## 2020-11-18 DIAGNOSIS — J4550 Severe persistent asthma, uncomplicated: Secondary | ICD-10-CM

## 2020-11-18 DIAGNOSIS — D721 Eosinophilia, unspecified: Secondary | ICD-10-CM | POA: Diagnosis not present

## 2020-11-18 DIAGNOSIS — E041 Nontoxic single thyroid nodule: Secondary | ICD-10-CM | POA: Diagnosis not present

## 2020-11-18 NOTE — Assessment & Plan Note (Signed)
Chronic elevation Plan- continue to follow

## 2020-11-18 NOTE — Assessment & Plan Note (Signed)
Uncomplicated as he continues Dupixent Plan- continue current Rx

## 2020-11-18 NOTE — Patient Instructions (Signed)
Order- schedule ultrasound of thyroid      dx thyroid nodule on CT  Glad your asthma is doing well. We can continue Dupixent and current meds.  Please call if we can help

## 2020-11-18 NOTE — Assessment & Plan Note (Signed)
Incidental finding Plan- [place] thryoid

## 2020-11-24 ENCOUNTER — Other Ambulatory Visit (HOSPITAL_COMMUNITY): Payer: Self-pay

## 2020-11-24 MED FILL — Dupilumab Subcutaneous Soln Auto-injector 300 MG/2ML: SUBCUTANEOUS | 28 days supply | Qty: 4 | Fill #3 | Status: AC

## 2020-11-25 ENCOUNTER — Ambulatory Visit (HOSPITAL_COMMUNITY)
Admission: RE | Admit: 2020-11-25 | Discharge: 2020-11-25 | Disposition: A | Discharge status: Home / Self Care | Payer: BC Managed Care – PPO | Source: Ambulatory Visit | Attending: Internal Medicine | Admitting: Internal Medicine

## 2020-11-25 ENCOUNTER — Other Ambulatory Visit: Payer: Self-pay

## 2020-11-25 DIAGNOSIS — E041 Nontoxic single thyroid nodule: Secondary | ICD-10-CM | POA: Insufficient documentation

## 2020-11-25 DIAGNOSIS — E042 Nontoxic multinodular goiter: Secondary | ICD-10-CM | POA: Diagnosis not present

## 2020-12-03 ENCOUNTER — Other Ambulatory Visit: Payer: Self-pay | Admitting: Neurology

## 2020-12-05 ENCOUNTER — Other Ambulatory Visit: Payer: Self-pay | Admitting: Neurology

## 2020-12-05 ENCOUNTER — Telehealth: Payer: Self-pay | Admitting: Internal Medicine

## 2020-12-05 DIAGNOSIS — E041 Nontoxic single thyroid nodule: Secondary | ICD-10-CM

## 2020-12-05 NOTE — Telephone Encounter (Signed)
Call made to patient, confirmed DOB. I apologized and made the patient aware the referral was never placed but I have put in the order and to call us back in about 2 weeks if he has not heard anything. Patient was appreciative. Voiced understanding.   Nothing further needed at this time.

## 2020-12-05 NOTE — Telephone Encounter (Signed)
Call returned to patient, confirmed DOB. Requesting referral for thyroid nodule. States he thought a referral was made.   CY please advise if okay to place referral for thyroid nodule. Thanks :)

## 2020-12-05 NOTE — Telephone Encounter (Signed)
Pt stated that he was supposed to be referred to a doctor for an evaluation of his neck/ and or thyroids and stated he has not heard anything from anyone. Pls regard; [phone]

## 2020-12-05 NOTE — Telephone Encounter (Signed)
In my result note from his thyroid ultrasound 7/19 I had ordered referral to Endocrinology for dx thyroid nodule. That would have [facility] to Triage pool, but apparently got missed. Please refer him.

## 2020-12-22 ENCOUNTER — Other Ambulatory Visit (HOSPITAL_COMMUNITY): Payer: Self-pay

## 2020-12-22 MED FILL — Dupilumab Subcutaneous Soln Auto-injector 300 MG/2ML: SUBCUTANEOUS | 28 days supply | Qty: 4 | Fill #4 | Status: AC

## 2020-12-25 ENCOUNTER — Other Ambulatory Visit (HOSPITAL_COMMUNITY): Payer: Self-pay

## 2021-01-13 ENCOUNTER — Ambulatory Visit (INDEPENDENT_AMBULATORY_CARE_PROVIDER_SITE_OTHER): Payer: BC Managed Care – PPO | Admitting: Internal Medicine

## 2021-01-13 ENCOUNTER — Other Ambulatory Visit: Payer: Self-pay

## 2021-01-13 VITALS — BP 150/94 | HR 73 | Ht 75.0 in | Wt 179.2 lb

## 2021-01-13 DIAGNOSIS — I471 Supraventricular tachycardia: Secondary | ICD-10-CM

## 2021-01-13 DIAGNOSIS — I10 Essential (primary) hypertension: Secondary | ICD-10-CM | POA: Diagnosis not present

## 2021-01-13 MED ORDER — AMLODIPINE BESYLATE 5 MG PO TABS: 5.0000 mg | ORAL_TABLET | Freq: Every day | ORAL | 3 refills | Status: DC

## 2021-01-13 NOTE — Progress Notes (Signed)
HPI Mr. Vallin returns today for followup. He is a pleasant 45 yo man with a concealed right posterior AP and SVT s/p ablation. He developed HTN. He was placed on metoprolol. He was in the ED with jaw pain and noted to have resting bradycardia which comes and goes and has not been associated with syncope. His bp is still elevated. He does not routinely check the BP. He notes that his asthma has been better.  Allergies  Allergen Reactions   Azithromycin Rash   Alvesco [Ciclesonide]     Possible reaction- rash   Anoro Ellipta [Umeclidinium-Vilanterol]     Possible- rash   Banana Swelling    Throat swelling    Methylprednisolone Other (See Comments)    unknown   Penicillins Rash    Has patient had a PCN reaction causing immediate rash, facial/tongue/throat swelling, SOB or lightheadedness with hypotension: Yes Has patient had a PCN reaction causing severe rash involving mucus membranes or skin necrosis: No Has patient had a PCN reaction that required hospitalization Yes Has patient had a PCN reaction occurring within the last 10 years: No If all of the above answers are "NO", then may proceed with Cephalosporin use.       Current Outpatient Medications  Medication Sig Dispense Refill   albuterol (VENTOLIN HFA) 108 (90 Base) MCG/ACT inhaler INHALE 2 PUFFS BY MOUTH EVERY 4 HOURS AS NEEDED 1 each 5   Budeson-Glycopyrrol-Formoterol (BREZTRI AEROSPHERE) 160-9-4.8 MCG/ACT AERO Inhale 2 puffs into the lungs 2 (two) times daily. Rinse mouth 10.7 g 11   Dupilumab 300 MG/2ML SOPN INJECT 300 MG INTO THE SKIN EVERY 14 DAYS. 4 mL 11   EPINEPHrine 0.3 mg/0.3 mL IJ SOAJ injection Inject into thigh for severe allergic reaction 1 Device prn   levalbuterol (XOPENEX) 0.31 MG/3ML nebulizer solution Take 3 mLs (0.31 mg total) by nebulization every 4 (four) hours as needed for wheezing. 75 mL 12   metoprolol succinate (TOPROL XL) 25 MG 24 hr tablet Take 1 tablet (25 mg total) by mouth daily. 90  tablet 3   montelukast (SINGULAIR) 10 MG tablet TAKE 1 TABLET BY MOUTH EVERYDAY AT BEDTIME (Patient taking differently: Take 10 mg by mouth daily.) 90 tablet 3   primidone (MYSOLINE) 50 MG tablet TAKE 3 TABLETS BY MOUTH IN THE MORNING AND 2 AT NIGHT THEREAFTER 450 tablet 0   trihexyphenidyl (ARTANE) 2 MG tablet Take 1 tablet (2 mg total) by mouth 2 (two) times daily with a meal. Take with breakfast and lunch 180 tablet 1   No current facility-administered medications for this visit.     Past Medical History:  Diagnosis Date   Acute conjunctivitis of right eye 10/07/2014   Acute respiratory failure with hypoxia (HCC) 07/23/2013   Asthma    Asthma    Phreesia 06/26/2020   Asthma, severe persistent 07/16/2010   PFT >> spiro 2012 with FEV1 1.14 (28%), ratio 45 Alpha 1 (08/12/10) >>MM ([facility])  IGE 1747, positive allergy profile >> Elevated eos on CBC S/p intubation 10/2010 Job exposure to respiratory irritant - bleach, chicken Allergy vaccine begun 11/09/2010. DC'd by pt 2013- resumed 2013> 1:50 GH; DC'd [facility] 9/24-9/27/ 2013- Status asthma, no vent. [facility] 3/ 2015- status ast   COVID-19 virus infection 06/17/2020   January, 2022. No hosp, no MAB   Eczema, allergic 07/22/2011   Essential tremor 06/27/2020   Seasonal allergies    Seasonal and perennial allergic rhinitis 07/17/2014   FENO  03/18/16- 100  High, indicates allergic asthma   Sinusitis, maxillary, 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   Sinus CT 12/10/2013 >>>    Status asthmaticus 10/19/2010   Sustained SVT (HCC) 09/24/2014   SVT (supraventricular tachycardia) (HCC) 09/24/2014    ROS:   All systems reviewed and negative except as noted in the HPI.   Past Surgical History:  Procedure Laterality Date   None     SVT ABLATION N/A 09/07/2019   Procedure: SVT ABLATION;  Surgeon: Marinus Maw, MD;  Location: [facility] INVASIVE CV LAB;  Service: Cardiovascular;  Laterality: N/A;     Family History  Problem Relation Age of Onset    Emphysema Father    Lung cancer Mother    Arthritis Sister      Social History   Socioeconomic History   Marital status: Single    Spouse name: Not on file   Number of children: Not on file   Years of education: Not on file   Highest education level: Not on file  Occupational History   Occupation: sanitation    Employer: [employer]  Tobacco Use   Smoking status: Former    Packs/day: 0.50    Years: 11.50    Pack years: 5.75    Types: Cigarettes    Quit date: 04/09/2010    Years since quitting: 10.7   Smokeless tobacco: Former   Tobacco comments:    started at age 28.  1/2 ppd.    Vaping Use   Vaping Use: Never used  Substance and Sexual Activity   Alcohol use: Yes    Comment: rarely   Drug use: No   Sexual activity: Yes    Birth control/protection: None  Other Topics Concern   Not on file  Social History Narrative   Left handed   Two story home   Drinks caffeine   Social Determinants of Health   Financial Resource Strain: Not on file  Food Insecurity: Not on file  Transportation Needs: Not on file  Physical Activity: Not on file  Stress: Not on file  Social Connections: Not on file  Intimate Partner Violence: Not on file     BP (!) 150/94   Pulse 73   Ht 6\' 3"  (1.905 m)   Wt 179 lb 3.2 oz (81.3 kg)   SpO2 95%   BMI 22.40 kg/m   Physical Exam:  Well appearing 45 yo man, NAD HEENT: Unremarkable Neck:  No JVD, no thyromegally Lymphatics:  No adenopathy Back:  No CVA tenderness Lungs:  Clear with no wheezes HEART:  Regular rate rhythm, no murmurs, no rubs, no clicks Abd:  soft, positive bowel sounds, no organomegally, no rebound, no guarding Ext:  2 plus pulses, no edema, no cyanosis, no clubbing Skin:  No rashes no nodules Neuro:  CN II through XII intact, motor grossly intact   Assess/Plan:  SVT - his symptoms appear to be controlled. Sinus brady - I would like for him to stay on the toprol and I asked her to check his HR with his bp  machine at least weekly and to notify me if his HR is running low. HTN - his bp is not well controlled. I asked him to start amlodipine. Asthma - this limits our medication options. I would like to use coreg for his pressure but will hold off. He is not wheezing today. He will continue his xopenex.   59, MD

## 2021-01-13 NOTE — Patient Instructions (Addendum)
Medication Instructions:  Your physician has recommended you make the following change in your medication:   START taking amlodipine 5 mg-  Take one tablet by mouth once a day.  Check your heart rate/blood pressure once a week and keep a log.  Labwork: None ordered.  Testing/Procedures: None ordered.  Follow-Up: Your physician wants you to follow-up in: 6 months with Drew Bunting, MD   Any Other Special Instructions Will Be Listed Below (If Applicable).  If you need a refill on your cardiac medications before your next appointment, please call your pharmacy.    Amlodipine Tablets What is this medication? AMLODIPINE (am LOE di peen) treats high blood pressure and prevents chest pain (angina). It works by relaxing the blood vessels, which helps decrease the amount of work your heart has to do. It belongs to a group of medications called calcium channel blockers. This medicine may be used for other purposes; ask your health care provider or pharmacist if you have questions. COMMON BRAND NAME(S): Norvasc What should I tell my care team before I take this medication? They need to know if you have any of these conditions: Heart disease Liver disease An unusual or allergic reaction to amlodipine, other medications, foods, dyes, or preservatives Pregnant or trying to get pregnant Breast-feeding How should I use this medication? Take this medication by mouth. Take it as directed on the prescription label at the same time every day. You can take it with or without food. If it upsets your stomach, take it with food. Keep taking it unless your care team tells you to stop. Talk to your care team about the use of this medication in children. While it may be prescribed for children as young as 6 for selected conditions, precautions do apply. Overdosage: If you think you have taken too much of this medicine contact a poison control center or emergency room at once. NOTE: This medicine is only  for you. Do not share this medicine with others. What if I miss a dose? If you miss a dose, take it as soon as you can. If it is almost time for your next dose, take only that dose. Do not take double or extra doses. What may interact with this medication? Clarithromycin Cyclosporine Diltiazem Itraconazole Simvastatin Tacrolimus This list may not describe all possible interactions. Give your health care provider a list of all the medicines, herbs, non-prescription drugs, or dietary supplements you use. Also tell them if you smoke, drink alcohol, or use illegal drugs. Some items may interact with your medicine. What should I watch for while using this medication? Visit your health care provider for regular checks on your progress. Check your blood pressure as directed. Ask your health care provider what your blood pressure should be. Also, find out when you should contact him or her. Do not treat yourself for coughs, colds, or pain while you are using this medication without asking your health care provider for advice. Some medications may increase your blood pressure. You may get drowsy or dizzy. Do not drive, use machinery, or do anything that needs mental alertness until you know how this medication affects you. Do not stand up or sit up quickly, especially if you are an older patient. This reduces the risk of dizzy or fainting spells. Alcohol can make you more drowsy and dizzy. Avoid alcoholic drinks. What side effects may I notice from receiving this medication? Side effects that you should report to your care team as soon as possible: Allergic reactions-skin  rash, itching, hives, swelling of the face, lips, tongue, or throat Heart attack-pain or tightness in the chest, shoulders, arms, or jaw, nausea, shortness of breath, cold or clammy skin, feeling faint or lightheaded Low blood pressure-dizziness, feeling faint or lightheaded, blurry vision Side effects that usually do not require medical  attention (report these to your care team if they continue or are bothersome): Facial flushing, redness Heart palpitations-rapid, pounding, or irregular heartbeat Nausea Stomach pain Swelling of the ankles, hands, or feet This list may not describe all possible side effects. Call your doctor for medical advice about side effects. You may report side effects to FDA at 1-800-FDA-1088. Where should I keep my medication? Keep out of the reach of children and pets. Store at room temperature between 20 and 25 degrees C (68 and 77 degrees F). Protect from light and moisture. Keep the container tightly closed. Get rid of any unused medication after the expiration date. To get rid of medications that are no longer needed or have expired: Take the medication to a medication take-back program. Check with your pharmacy or law enforcement to find a location. If you cannot return the medication, check the label or package insert to see if the medication should be thrown out in the garbage or flushed down the toilet. If you are not sure, ask your health care provider. If it is safe to put in the trash, empty the medication out of the container. Mix the medication with cat litter, dirt, coffee grounds, or other unwanted substance. Seal the mixture in a bag or container. Put it in the trash. NOTE: This sheet is a summary. It may not cover all possible information. If you have questions about this medicine, talk to your doctor, pharmacist, or health care provider.  2022 Elsevier/Gold Standard (2020-03-22 14:59:47)

## 2021-01-19 ENCOUNTER — Other Ambulatory Visit (HOSPITAL_COMMUNITY): Payer: Self-pay

## 2021-01-19 MED FILL — Dupilumab Subcutaneous Soln Auto-injector 300 MG/2ML: SUBCUTANEOUS | 28 days supply | Qty: 4 | Fill #5 | Status: AC

## 2021-01-27 ENCOUNTER — Other Ambulatory Visit: Payer: Self-pay | Admitting: Internal Medicine

## 2021-02-17 ENCOUNTER — Other Ambulatory Visit (HOSPITAL_COMMUNITY): Payer: Self-pay

## 2021-02-17 ENCOUNTER — Ambulatory Visit: Payer: BC Managed Care – PPO | Admitting: Neurology

## 2021-02-17 MED FILL — Dupilumab Subcutaneous Soln Auto-injector 300 MG/2ML: SUBCUTANEOUS | 28 days supply | Qty: 4 | Fill #6 | Status: AC

## 2021-02-19 ENCOUNTER — Other Ambulatory Visit (HOSPITAL_COMMUNITY): Payer: Self-pay

## 2021-02-27 ENCOUNTER — Other Ambulatory Visit: Payer: Self-pay | Admitting: Neurology

## 2021-02-27 DIAGNOSIS — G250 Essential tremor: Secondary | ICD-10-CM

## 2021-03-05 ENCOUNTER — Encounter: Payer: BC Managed Care – PPO | Admitting: Internal Medicine

## 2021-03-06 ENCOUNTER — Other Ambulatory Visit: Payer: Self-pay | Admitting: Neurology

## 2021-03-06 NOTE — Telephone Encounter (Signed)
Patient has appt with Dr.Tat November 2, no further refills until seen.

## 2021-03-10 NOTE — Progress Notes (Signed)
Assessment/Plan:    1.  Essential Tremor, exacerbated by medication for asthma  -Continue primidone, 3 tablets in the morning, 2 tablets at night   -understands that tremor likely will never be fully under control due to other medications that induce tremor   -cardiology started him on low dose metoprolol for SVT.  It is probably too low dose to help tremor but given severity of asthma, wouldn't want to increase BB even if able.  -start gabapentin, 300 mg twice per day.  -discussed dbs in detail.  He is not interested.  Discussed focused ultrasound as well today.  He does not think he is interested in that.   2.  Severe Asthma  -Medications for this contribute to tremor  3.  SVT  -Following with cardiology.  Subjective:   Drew Sandoval was seen today in follow up for essential tremor, which is much exacerbated by medication for his asthma.  My previous records were reviewed prior to todays visit.  Thinks that tremor is still getting worse.  Tremor comes and goes.  Doesn't matter how much lung drug he takes - no albuterol in 8 months.  Current prescribed movement disorder medications: Primidone, 50 mg, 3 in the morning, 2 at night  Trihexyphenidyl, 2 mg twice per day (started last visit) - pt states that he is no longer on it - states that it caused itching of the hands and feet.  He doesn't think that it helped much    PREVIOUS MEDICATIONS:  primidone ; artane (thought caused hand/feet itching) .   ALLERGIES:   Allergies  Allergen Reactions   Azithromycin Rash   Alvesco [Ciclesonide]     Possible reaction- rash   Anoro Ellipta [Umeclidinium-Vilanterol]     Possible- rash   Banana Swelling    Throat swelling    Methylprednisolone Other (See Comments)    unknown   Penicillins Rash    Has patient had a PCN reaction causing immediate rash, facial/tongue/throat swelling, SOB or lightheadedness with hypotension: Yes Has patient had a PCN reaction causing severe rash  involving mucus membranes or skin necrosis: No Has patient had a PCN reaction that required hospitalization Yes Has patient had a PCN reaction occurring within the last 10 years: No If all of the above answers are "NO", then may proceed with Cephalosporin use.      CURRENT MEDICATIONS:  Outpatient Encounter Medications as of 03/11/2021  Medication Sig   albuterol (VENTOLIN HFA) 108 (90 Base) MCG/ACT inhaler INHALE 2 PUFFS BY MOUTH EVERY 4 HOURS AS NEEDED   amLODipine (NORVASC) 5 MG tablet Take 1 tablet (5 mg total) by mouth daily.   Budeson-Glycopyrrol-Formoterol (BREZTRI AEROSPHERE) 160-9-4.8 MCG/ACT AERO Inhale 2 puffs into the lungs 2 (two) times daily. Rinse mouth   Dupilumab 300 MG/2ML SOPN INJECT 300 MG INTO THE SKIN EVERY 14 DAYS.   EPINEPHrine 0.3 mg/0.3 mL IJ SOAJ injection Inject into thigh for severe allergic reaction   levalbuterol (XOPENEX) 0.31 MG/3ML nebulizer solution Take 3 mLs (0.31 mg total) by nebulization every 4 (four) hours as needed for wheezing.   metoprolol succinate (TOPROL XL) 25 MG 24 hr tablet Take 1 tablet (25 mg total) by mouth daily.   montelukast (SINGULAIR) 10 MG tablet TAKE 1 TABLET BY MOUTH EVERYDAY AT BEDTIME   primidone (MYSOLINE) 50 MG tablet TAKE 3 TABLETS BY MOUTH IN THE MORNING AND 2 AT NIGHT THEREAFTER   [DISCONTINUED] trihexyphenidyl (ARTANE) 2 MG tablet TAKE 1 TABLET (2 MG TOTAL) BY MOUTH 2 (  TWO) TIMES DAILY WITH A MEAL. TAKE WITH BREAKFAST AND LUNCH   No facility-administered encounter medications on file as of 03/11/2021.     Objective:    PHYSICAL EXAMINATION:    VITALS:   Vitals:   03/11/21 1258  BP: (!) 144/78  Pulse: 72  SpO2: 95%  Weight: 183 lb 12.8 oz (83.4 kg)  Height: 6\' 3"  (1.905 m)     GEN:  The patient appears stated age and is in NAD. HEENT:  Normocephalic, atraumatic.  The mucous membranes are moist.   Neurological examination:  Orientation: The patient is alert and oriented x3. Cranial nerves: There is good  facial symmetry. The speech is fluent and clear. Soft palate rises symmetrically and there is no tongue deviation. Hearing is intact to conversational tone. Sensation: Sensation is intact to light touch throughout Motor: Strength is at least antigravity x4.  Movement examination: Tone: There is normal tone in the UE/LE Abnormal movements: L>R intention tremor.  He has mild trouble with Archimedes spirals.  He is left-hand dominant, and the left hand shakes more than the right. Coordination:  There is no decremation with RAM's Gait and Station: The patient has no difficulty arising out of a deep-seated chair without the use of the hands. The patient's stride length is good I have reviewed and interpreted the following labs independently   Chemistry      Component Value Date/Time   NA 140 11/12/2020 1823   K 3.7 11/12/2020 1823   CL 108 11/12/2020 1823   CO2 25 11/12/2020 1823   BUN 13 11/12/2020 1823   CREATININE 0.87 11/12/2020 1823      Component Value Date/Time   CALCIUM 8.8 (L) 11/12/2020 1823   ALKPHOS 70 07/02/2020 0842   AST 17 07/02/2020 0842   ALT 12 07/02/2020 0842   BILITOT 0.2 07/02/2020 0842      Lab Results  Component Value Date   WBC 8.8 11/12/2020   HGB 13.3 11/12/2020   HCT 40.2 11/12/2020   MCV 92.8 11/12/2020   PLT 211 11/12/2020   No results found for: TSH   Chemistry      Component Value Date/Time   NA 140 11/12/2020 1823   K 3.7 11/12/2020 1823   CL 108 11/12/2020 1823   CO2 25 11/12/2020 1823   BUN 13 11/12/2020 1823   CREATININE 0.87 11/12/2020 1823      Component Value Date/Time   CALCIUM 8.8 (L) 11/12/2020 1823   ALKPHOS 70 07/02/2020 0842   AST 17 07/02/2020 0842   ALT 12 07/02/2020 0842   BILITOT 0.2 07/02/2020 0842         Cc:  07/04/2020, MD

## 2021-03-11 ENCOUNTER — Encounter: Payer: Self-pay | Admitting: Neurology

## 2021-03-11 ENCOUNTER — Ambulatory Visit (INDEPENDENT_AMBULATORY_CARE_PROVIDER_SITE_OTHER): Payer: BC Managed Care – PPO | Admitting: Neurology

## 2021-03-11 ENCOUNTER — Other Ambulatory Visit: Payer: Self-pay

## 2021-03-11 VITALS — BP 144/78 | HR 72 | Ht 75.0 in | Wt 183.8 lb

## 2021-03-11 DIAGNOSIS — G250 Essential tremor: Secondary | ICD-10-CM

## 2021-03-11 MED ORDER — PRIMIDONE 50 MG PO TABS: ORAL_TABLET | ORAL | 1 refills | Status: DC

## 2021-03-11 MED ORDER — GABAPENTIN 300 MG PO CAPS: 300.0000 mg | ORAL_CAPSULE | Freq: Two times a day (BID) | ORAL | 1 refills | Status: DC

## 2021-03-12 ENCOUNTER — Ambulatory Visit: Payer: BC Managed Care – PPO | Admitting: Neurology

## 2021-03-17 ENCOUNTER — Other Ambulatory Visit (HOSPITAL_COMMUNITY): Payer: Self-pay

## 2021-03-18 ENCOUNTER — Other Ambulatory Visit (HOSPITAL_COMMUNITY): Payer: Self-pay

## 2021-03-18 MED FILL — Dupilumab Subcutaneous Soln Auto-injector 300 MG/2ML: SUBCUTANEOUS | 28 days supply | Qty: 4 | Fill #7 | Status: AC

## 2021-04-08 ENCOUNTER — Other Ambulatory Visit (HOSPITAL_COMMUNITY): Payer: Self-pay

## 2021-04-08 MED FILL — Dupilumab Subcutaneous Soln Auto-injector 300 MG/2ML: SUBCUTANEOUS | 28 days supply | Qty: 4 | Fill #8 | Status: CN

## 2021-04-08 MED FILL — Dupilumab Subcutaneous Soln Auto-injector 300 MG/2ML: SUBCUTANEOUS | 28 days supply | Qty: 4 | Fill #8 | Status: AC

## 2021-04-13 ENCOUNTER — Other Ambulatory Visit (HOSPITAL_COMMUNITY): Payer: Self-pay

## 2021-04-15 ENCOUNTER — Other Ambulatory Visit (HOSPITAL_COMMUNITY): Payer: Self-pay

## 2021-05-05 ENCOUNTER — Telehealth: Payer: Self-pay | Admitting: Internal Medicine

## 2021-05-05 DIAGNOSIS — J4550 Severe persistent asthma, uncomplicated: Secondary | ICD-10-CM

## 2021-05-05 NOTE — Telephone Encounter (Signed)
Spoke with the pt  He has been having mild increased SOB past wk- using neb and this has helped  His machine broke today and he requests that we order new one and refill xopenex  Rx sent and order to [facility]

## 2021-05-13 ENCOUNTER — Other Ambulatory Visit (HOSPITAL_COMMUNITY): Payer: Self-pay

## 2021-05-13 MED FILL — Dupilumab Subcutaneous Soln Auto-injector 300 MG/2ML: SUBCUTANEOUS | 28 days supply | Qty: 4 | Fill #9 | Status: AC

## 2021-05-14 ENCOUNTER — Telehealth: Payer: Self-pay

## 2021-05-14 NOTE — Telephone Encounter (Signed)
PA renewal initiated automatically by CoverMyMeds.  Submitted a Prior Authorization request to [employer] for DUPIXENT via CoverMyMeds. Will update once we receive a response.   Key: [id number]

## 2021-05-18 NOTE — Telephone Encounter (Signed)
Received notification from [facility] regarding a prior authorization for [place]. Authorization has been APPROVED from 05/16/2021 to 05/09/2022 (presumably, CMM does not actually display an approval date range).  Will await approval letter for date confirmation.  PA Case ID (possibly auth#): [id number]

## 2021-05-20 NOTE — Progress Notes (Signed)
Patient ID: Drew Sandoval, male    DOB: 11/25/1975    MRN: [id number]  HPI M  former smoker followed for severe asthma/ COPD, chronic sinusitis, hyper-eosinophilia and hyper IgE, complicated by history SVT/cardioversion FENO 03/18/16- 100  High, indicates allergic asthma Office Spirometry 03/18/2016-severe obstructive airways disease FVC 2.62/51%, FEV1 1.30/31%, FEV1/FVC 0.50, FEF 25-75 0.61/14% Office Spirometry 01/25/17-severe obstructive airways disease. FVC 3.52/68%, FEV1 1.80/43%, ratio or 0.51, FEF 25-75% 0.76/18% IgE was too high for Xolair. He could not afford Nucala or allergy vaccine. Cinqair infusion@ [facility]- ordered 07/21/17 ----------------------------------------------------   11/18/20- 46 year old male former smoker (5.75 pk yrs) followed for severe asthma/ COPD/ Dupixent, chronic sinusitis, persistent eosinophilia, hyper IgE-too high for Xolair, history SVT/cardioversion/ ablation, Kidney stone, Covid infection Jan, 2022, HTN,  -Breztri, Dupixent, Singulair, Ventolin hfa, neb albuterol Covid vax-2 [facility] ED 11/12/20- L parotid stone Lab- 11/12/20- Eos H 1.8 K/ul ACT score- 24 -----Doing well on dupixent Not needing to use any inhalers lately and feels very well controlled on Dupixent. This is dramatically better than in past.  Hypereosinophilia persists on recent lab, without associated changes. Incidental thyroid nodule noted on recent CT neck for parotid stone. Parotid pain much improved as he continues abx. He was unaware of thyroid nodule.  CXR 06/17/20- PRESSION: No acute process in the chest.  05/21/21- 46 year old male former smoker (5.75 pk yrs) followed for severe asthma/ COPD/ Dupixent, chronic sinusitis, persistent eosinophilia, hyper IgE-too high for Xolair, history SVT/cardioversion/ ablation, Kidney stone, Covid infection Jan, 2022, HTN,  -Breztri, Dupixent, Singulair, Ventolin hfa, neb levalbuterol Covid vax-2 [facility] Flu vax-today -----Patient is doing good, no  concerns Doing very well feeling quite stable on Dupixent.  Needs nebulizer solution refilled and wants flu shot today.  No recent exacerbations.  Far less emergency room time since [place]. Heart rhythm control has been good and we discussed advantage of not needing as much aggressive bronchodilator use as a help for that issue as well.   ROS-see HPI  "+" = positive Constitutional:    weight loss, night sweats, fevers, chills, fatigue, lassitude. HEENT:    headaches, difficulty swallowing, tooth/dental problems, sore throat,       sneezing, itching, ear ache, +nasal congestion, +post nasal drip, snoring CV:    chest pain, orthopnea, PND, swelling in lower extremities, anasarca,                                                      dizziness, +palpitations Resp:   +shortness of breath with exertion or at rest.                productive cough,  non-productive cough, coughing up of blood.              change in color of mucus.  +wheezing.   Skin:    rash or lesions. GI:  No-   heartburn, indigestion, abdominal pain, nausea, vomiting,  GU: d. MS:   joint pain, stiffness, decreased range of motion, back pain. Neuro-     nothing unusual Psych:  change in mood or affect.  depression or anxiety.   memory loss.  Objective:   Physical Exam General- Alert, Oriented, Affect-reserved/ laconic, Distress-none acute;  Tall, thin Skin- +tatoos Lymphadenopathy- none Head- atraumatic            Eyes- Gross vision intact, PERRLA,  conjunctivae clear secretions            Ears- Hearing, canals normal            Nose- Clear, no-Septal dev, mucus, polyps, erosion, perforation             Throat- Mallampati II , mucosa clear , drainage- none, tonsils- atrophic Neck- flexible , trachea midline, no stridor , thyroid nl, carotid no bruit Chest - symmetrical excursion , unlabored           Heart/CV- RRR  , no murmur , no gallop  , no rub, nl s1 s2                           - JVD- none , edema- none, stasis  changes- none, varices- none           Lung-   +Diminished/ clear, unlabored,  Cough-none, dullness-none, rub- none,  Wheeze-none Abd- scaphoid  Br/ Gen/ Rectal- Not done, not indicated Extrem- cyanosis- none, clubbing, none, atrophy- none, strength- nl Neuro- unremarkable

## 2021-05-21 ENCOUNTER — Other Ambulatory Visit: Payer: Self-pay

## 2021-05-21 ENCOUNTER — Encounter: Payer: Self-pay | Admitting: Internal Medicine

## 2021-05-21 ENCOUNTER — Ambulatory Visit (INDEPENDENT_AMBULATORY_CARE_PROVIDER_SITE_OTHER): Payer: BC Managed Care – PPO | Admitting: Internal Medicine

## 2021-05-21 VITALS — BP 150/98 | HR 65 | Temp 98.0°F | Ht 75.0 in | Wt 186.6 lb

## 2021-05-21 DIAGNOSIS — Z23 Encounter for immunization: Secondary | ICD-10-CM | POA: Diagnosis not present

## 2021-05-21 DIAGNOSIS — J4550 Severe persistent asthma, uncomplicated: Secondary | ICD-10-CM | POA: Diagnosis not present

## 2021-05-21 DIAGNOSIS — I471 Supraventricular tachycardia: Secondary | ICD-10-CM | POA: Diagnosis not present

## 2021-05-21 MED ORDER — LEVALBUTEROL HCL 0.31 MG/3ML IN NEBU
1.0000 | INHALATION_SOLUTION | RESPIRATORY_TRACT | 12 refills | Status: DC | PRN
Start: 2021-05-21 — End: 2022-09-24

## 2021-05-21 MED ORDER — LEVALBUTEROL HCL 0.31 MG/3ML IN NEBU: 1.0000 | INHALATION_SOLUTION | RESPIRATORY_TRACT | 12 refills | Status: DC | PRN

## 2021-05-21 NOTE — Assessment & Plan Note (Signed)
He reports good control with no recent exacerbations.  Still followed by cardiology.

## 2021-05-21 NOTE — Assessment & Plan Note (Signed)
Now managed in the moderate persistent uncomplicated range. Plan-continue Dupixent.  Refill lev albuterol neb solution, flu shot

## 2021-05-21 NOTE — Patient Instructions (Signed)
Levalbuterol (xopenex) neb solution refilled  Order- flu vax standard  Please call if we can help

## 2021-05-29 ENCOUNTER — Telehealth: Payer: Self-pay | Admitting: Internal Medicine

## 2021-05-29 ENCOUNTER — Other Ambulatory Visit: Payer: Self-pay | Admitting: Internal Medicine

## 2021-05-29 MED ORDER — ALBUTEROL SULFATE HFA 108 (90 BASE) MCG/ACT IN AERS: 2.0000 | INHALATION_SPRAY | RESPIRATORY_TRACT | 5 refills | Status: DC | PRN

## 2021-05-29 NOTE — Telephone Encounter (Signed)
Rx for pt's Ventolin inhaler has been sent to preferred pharmacy for pt. Called and spoke with pt letting him know this had been done and he verbalized understanding. Nothing further needed.

## 2021-06-01 MED ORDER — BREZTRI AEROSPHERE 160-9-4.8 MCG/ACT IN AERO: 2.0000 | INHALATION_SPRAY | Freq: Two times a day (BID) | RESPIRATORY_TRACT | 11 refills | Status: DC

## 2021-06-01 NOTE — Telephone Encounter (Signed)
Rx for Breztri has been sent to preferred pharmacy for pt. Called and spoke with pt letting him know this had been done and he verbalized understanding. Nothing further needed. 

## 2021-06-01 NOTE — Addendum Note (Signed)
Addended by: Wyvonne Lenz on: 06/01/2021 04:46 PM   Modules accepted: Orders

## 2021-06-02 ENCOUNTER — Other Ambulatory Visit: Payer: Self-pay | Admitting: Neurology

## 2021-06-02 ENCOUNTER — Encounter: Payer: BC Managed Care – PPO | Admitting: Internal Medicine

## 2021-06-02 DIAGNOSIS — G250 Essential tremor: Secondary | ICD-10-CM

## 2021-06-02 NOTE — Telephone Encounter (Signed)
Rx too soon

## 2021-06-05 ENCOUNTER — Other Ambulatory Visit (HOSPITAL_COMMUNITY): Payer: Self-pay

## 2021-06-08 ENCOUNTER — Other Ambulatory Visit: Payer: Self-pay | Admitting: Internal Medicine

## 2021-06-08 ENCOUNTER — Other Ambulatory Visit (HOSPITAL_COMMUNITY): Payer: Self-pay

## 2021-06-08 DIAGNOSIS — J4551 Severe persistent asthma with (acute) exacerbation: Secondary | ICD-10-CM

## 2021-06-10 ENCOUNTER — Other Ambulatory Visit: Payer: Self-pay | Admitting: Internal Medicine

## 2021-06-10 ENCOUNTER — Other Ambulatory Visit (HOSPITAL_COMMUNITY): Payer: Self-pay

## 2021-06-10 DIAGNOSIS — J4551 Severe persistent asthma with (acute) exacerbation: Secondary | ICD-10-CM

## 2021-06-11 ENCOUNTER — Other Ambulatory Visit: Payer: Self-pay | Admitting: Internal Medicine

## 2021-06-11 ENCOUNTER — Other Ambulatory Visit (HOSPITAL_COMMUNITY): Payer: Self-pay

## 2021-06-11 ENCOUNTER — Ambulatory Visit: Payer: BC Managed Care – PPO | Admitting: Nurse Practitioner

## 2021-06-11 ENCOUNTER — Encounter: Payer: Self-pay | Admitting: Nurse Practitioner

## 2021-06-11 ENCOUNTER — Other Ambulatory Visit: Payer: Self-pay

## 2021-06-11 DIAGNOSIS — R1033 Periumbilical pain: Secondary | ICD-10-CM | POA: Insufficient documentation

## 2021-06-11 DIAGNOSIS — J4551 Severe persistent asthma with (acute) exacerbation: Secondary | ICD-10-CM

## 2021-06-11 DIAGNOSIS — R319 Hematuria, unspecified: Secondary | ICD-10-CM | POA: Diagnosis not present

## 2021-06-11 LAB — POCT URINALYSIS DIP (CLINITEK)
Bilirubin, UA: NEGATIVE
Glucose, UA: NEGATIVE mg/dL
Ketones, POC UA: NEGATIVE mg/dL
Leukocytes, UA: NEGATIVE
Nitrite, UA: NEGATIVE
POC PROTEIN,UA: 30 — AB
Spec Grav, UA: >=1.03 — AB (ref 1.010–1.025)
Urobilinogen, UA: 0.2 E.U./dL
pH, UA: 7 (ref 5.0–8.0)

## 2021-06-11 NOTE — Assessment & Plan Note (Signed)
Hematuria. UA shows large RBC. Possible kidney stone d/t previous history of kidney stones.  CT renal study ordered.  Patient told to drink plenty of water and use Tylenol as needed for his pain.

## 2021-06-11 NOTE — Progress Notes (Addendum)
Virtual Visit via Telephone Note  I connected with Drew Sandoval on 06/11/21 at 1001by telephone and verified that I am speaking with the correct person using two identifiers.  I spent 7 minutes talking to the patient and reviewing his chart.   Location: Patient: home Provider: office   I discussed the limitations, risks, security and privacy concerns of performing an evaluation and management service by telephone and the availability of in person appointments. I also discussed with the patient that there may be a patient responsible charge related to this service. The patient expressed understanding and agreed to proceed.   History of Present Illness: Pt c/o blood in the urine and sharp constant pain 9/10 below his belly bottom since around 06/08/21. He stated that he had kidney stone 5 years ago. He stated that the kidney stones were detected on imaging study. Pt denies fever, chill, dysuria. He has not tried any medication. Pt is sexual active has one sexual partner, denies abnormal genital discharge, ulcers. He has not taken any medication for his pain.    Observations/Objective:   Assessment and Plan: Hematuria. UA shows large RBC. Possible kidney stone d/t previous history of kidney stones.?  CT renal study ordered.  Patient told to drink plenty of water and use Tylenol as needed for his pain. Periumbilical pain.  CT renal study ordered to rule out kidney stone.   Follow Up Instructions:    I discussed the assessment and treatment plan with the patient. The patient was provided an opportunity to ask questions and all were answered. The patient agreed with the plan and demonstrated an understanding of the instructions.   The patient was advised to call back or seek an in-person evaluation if the symptoms worsen or if the condition fails to improve as anticipated.

## 2021-06-11 NOTE — Assessment & Plan Note (Signed)
Periumbilical pain.  CT renal study ordered to rule out kidney stone.  Use Tylenol as needed for pain.

## 2021-06-12 ENCOUNTER — Emergency Department (HOSPITAL_COMMUNITY)
Admission: EM | Admit: 2021-06-12 | Discharge: 2021-06-13 | Disposition: A | Discharge status: Home / Self Care | Payer: BC Managed Care – PPO | Attending: Emergency Medicine | Admitting: Emergency Medicine

## 2021-06-12 ENCOUNTER — Other Ambulatory Visit: Payer: Self-pay

## 2021-06-12 ENCOUNTER — Other Ambulatory Visit (HOSPITAL_COMMUNITY): Payer: Self-pay

## 2021-06-12 ENCOUNTER — Other Ambulatory Visit: Payer: Self-pay | Admitting: Nurse Practitioner

## 2021-06-12 ENCOUNTER — Encounter: Payer: Self-pay | Admitting: Nurse Practitioner

## 2021-06-12 ENCOUNTER — Ambulatory Visit (HOSPITAL_COMMUNITY)
Admission: RE | Admit: 2021-06-12 | Discharge: 2021-06-12 | Disposition: A | Discharge status: Home / Self Care | Payer: BC Managed Care – PPO | Source: Ambulatory Visit | Attending: Nurse Practitioner | Admitting: Nurse Practitioner

## 2021-06-12 DIAGNOSIS — R319 Hematuria, unspecified: Secondary | ICD-10-CM

## 2021-06-12 DIAGNOSIS — R1033 Periumbilical pain: Secondary | ICD-10-CM | POA: Insufficient documentation

## 2021-06-12 DIAGNOSIS — N3001 Acute cystitis with hematuria: Secondary | ICD-10-CM | POA: Diagnosis not present

## 2021-06-12 DIAGNOSIS — N3091 Cystitis, unspecified with hematuria: Secondary | ICD-10-CM | POA: Insufficient documentation

## 2021-06-12 DIAGNOSIS — Z87891 Personal history of nicotine dependence: Secondary | ICD-10-CM | POA: Insufficient documentation

## 2021-06-12 DIAGNOSIS — R109 Unspecified abdominal pain: Secondary | ICD-10-CM | POA: Diagnosis not present

## 2021-06-12 DIAGNOSIS — Z8616 Personal history of COVID-19: Secondary | ICD-10-CM | POA: Diagnosis not present

## 2021-06-12 DIAGNOSIS — R911 Solitary pulmonary nodule: Secondary | ICD-10-CM

## 2021-06-12 DIAGNOSIS — J4550 Severe persistent asthma, uncomplicated: Secondary | ICD-10-CM | POA: Insufficient documentation

## 2021-06-12 MED FILL — Dupilumab Subcutaneous Soln Auto-injector 300 MG/2ML: SUBCUTANEOUS | 28 days supply | Qty: 4 | Fill #0 | Status: AC

## 2021-06-12 NOTE — Progress Notes (Signed)
Please review result with pt. Kidneys appears normal  But he has  Small pulmonary nodule at the LEFT lung base 8 x 5 mm. I sent in a referral to pulmonology for his pulmonary nodule.  He should keep his upcoming appointment with Dr Allena Katz.  thanks

## 2021-06-12 NOTE — ED Notes (Signed)
Upon assessment. Pt states " tremors are normal and I take medication for that"

## 2021-06-12 NOTE — Telephone Encounter (Signed)
Refill sent for DUPIXENT to [facility]: [phone]   Dose: 300 mg every 14 days  Last OV: 05/21/21 Provider: Dr. Maple Hudson  Next OV: 11/19/21  Chesley Mires, PharmD, MPH, BCPS Clinical Pharmacist (Rheumatology and Pulmonology)

## 2021-06-12 NOTE — ED Triage Notes (Addendum)
Pt to ED from work. C/o urinating blood since Monday 06/08/2021 . Pt c/o LLQ pain that started Monday as well. Pt denies any fever, n/v/d and painful urination.  Pt had a CT today 06/12/2021 at [facility] and was unremarkable but pt still feels that something is going on. Pt has been drinking cranberry juice to treat but states it is not helping.

## 2021-06-13 LAB — COMPREHENSIVE METABOLIC PANEL
ALT: 15 U/L (ref 0–44)
AST: 17 U/L (ref 15–41)
Albumin: 3.6 g/dL (ref 3.5–5.0)
Alkaline Phosphatase: 62 U/L (ref 38–126)
Anion gap: 6 (ref 5–15)
BUN: 14 mg/dL (ref 6–20)
CO2: 26 mmol/L (ref 22–32)
Calcium: 8.7 mg/dL — ABNORMAL LOW (ref 8.9–10.3)
Chloride: 107 mmol/L (ref 98–111)
Creatinine, Ser: 0.92 mg/dL (ref 0.61–1.24)
GFR, Estimated: >60 mL/min (ref >60)
Glucose, Bld: 88 mg/dL (ref 70–99)
Potassium: 4.2 mmol/L (ref 3.5–5.1)
Sodium: 139 mmol/L (ref 135–145)
Total Bilirubin: 0.5 mg/dL (ref 0.3–1.2)
Total Protein: 7.2 g/dL (ref 6.5–8.1)

## 2021-06-13 LAB — URINALYSIS, MICROSCOPIC (REFLEX): RBC / HPF: >50 RBC/hpf (ref 0–5)

## 2021-06-13 LAB — CBC
HCT: 40.4 % (ref 39.0–52.0)
Hemoglobin: 13.4 g/dL (ref 13.0–17.0)
MCH: 31.1 pg (ref 26.0–34.0)
MCHC: 33.2 g/dL (ref 30.0–36.0)
MCV: 93.7 fL (ref 80.0–100.0)
Platelets: 228 10*3/uL (ref 150–400)
RBC: 4.31 MIL/uL (ref 4.22–5.81)
RDW: 13.8 % (ref 11.5–15.5)
WBC: 6.1 10*3/uL (ref 4.0–10.5)
nRBC: 0 % (ref 0.0–0.2)

## 2021-06-13 LAB — URINALYSIS, ROUTINE W REFLEX MICROSCOPIC
Glucose, UA: NEGATIVE mg/dL
Ketones, ur: NEGATIVE mg/dL
Leukocytes,Ua: NEGATIVE
Nitrite: NEGATIVE
Protein, ur: 30 mg/dL — AB
Specific Gravity, Urine: >1.03 — ABNORMAL HIGH (ref 1.005–1.030)
pH: 5.5 (ref 5.0–8.0)

## 2021-06-13 MED ORDER — CIPROFLOXACIN HCL 500 MG PO TABS: 500.0000 mg | ORAL_TABLET | Freq: Two times a day (BID) | ORAL | 0 refills | Status: AC

## 2021-06-13 MED ORDER — CIPROFLOXACIN HCL 250 MG PO TABS
500.0000 mg | ORAL_TABLET | Freq: Once | ORAL | Status: AC
Start: 2021-06-13 — End: 2021-06-13
  Administered 2021-06-13: 500 mg via ORAL
  Filled 2021-06-13: qty 2

## 2021-06-13 NOTE — ED Provider Notes (Signed)
Drew Sandoval MRN:  [id number]  Arrival date & time: 06/13/21     Chief Complaint   Hematuria   History of Present Illness   Drew Sandoval is a 46 y.o. year-old male with no pertinent past medical history presenting to the ED with chief complaint of hematuria.  Hematuria for about 5 days intermittently.  Associated with suprapubic pain.  No fever, no other complaints.  Review of Systems  A thorough review of systems was obtained and all systems are negative except as noted in the HPI and PMH.   Patient's Health History    Past Medical History:  Diagnosis Date   Acute conjunctivitis of right eye 10/07/2014   Acute respiratory failure with hypoxia ([place]) 07/23/2013   Asthma    Asthma    Phreesia 06/26/2020   Asthma, severe persistent 07/16/2010   PFT >> spiro 2012 with FEV1 1.14 (28%), ratio 45 Alpha 1 (08/12/10) >>MM ([facility])  IGE 1747, positive allergy profile >> Elevated eos on CBC S/p intubation 10/2010 Job exposure to respiratory irritant - bleach, chicken Allergy vaccine begun 11/09/2010. DC'd by pt 2013- resumed 2013> 1:50 GH; [place] 9/24-9/27/ 2013- Status asthma, no vent. [facility] 3/ 2015- status ast   COVID-19 virus infection 06/17/2020   January, 2022. No hosp, no MAB   Eczema, allergic 07/22/2011   Essential tremor 06/27/2020   Seasonal allergies    Seasonal and perennial allergic rhinitis 07/17/2014   FENO 03/18/16- 100  High, indicates allergic asthma   Sinusitis, maxillary, 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   Sinus CT 12/10/2013 >>>    Status asthmaticus 10/19/2010   Sustained SVT ([place]) 09/24/2014   SVT (supraventricular tachycardia) ([place]) 09/24/2014    Past Surgical History:  Procedure Laterality Date   None     SVT ABLATION N/A 09/07/2019   Procedure: SVT ABLATION;  Surgeon: Evans Lance, MD;  Location: [facility];  Service: Cardiovascular;  Laterality: N/A;    Family History  Problem  Relation Age of Onset   Emphysema Father    Lung cancer Mother    Arthritis Sister     Social History   Socioeconomic History   Marital status: Single    Spouse name: Not on file   Number of children: Not on file   Years of education: Not on file   Highest education level: Not on file  Occupational History   Occupation: sanitation    Employer: [employer]  Tobacco Use   Smoking status: Former    Packs/day: 0.50    Years: 11.50    Pack years: 5.75    Types: Cigarettes    Quit date: 04/09/2010    Years since quitting: 11.1   Smokeless tobacco: Former   Tobacco comments:    started at age 65.  1/2 ppd.    Vaping Use   Vaping Use: Never used  Substance and Sexual Activity   Alcohol use: Yes    Comment: rarely   Drug use: No   Sexual activity: Yes    Birth control/protection: None  Other Topics Concern   Not on file  Social History Narrative   Left handed   Two story home   Drinks caffeine   Social Determinants of Health   Financial Resource Strain: Not on file  Food Insecurity: Not on file  Transportation Needs: Not on file  Physical Activity: Not on file  Stress: Not on file  Social Connections: Not on file  Intimate Partner Violence: Not on file     Physical Exam   Vitals:   06/13/21 0030 06/13/21 0100  BP: 135/78 (!) 154/88  Pulse: (!) 59 67  Resp:  17  Temp:    SpO2: 97% 97%    CONSTITUTIONAL: Well-appearing, NAD NEURO/PSYCH:  Alert and oriented x 3, no focal deficits EYES:  eyes equal and reactive ENT/NECK:  no LAD, no JVD CARDIO: Regular rate, well-perfused, normal S1 and S2 PULM:  CTAB no wheezing or rhonchi GI/GU:  non-distended, mild suprapubic tenderness MSK/SPINE:  No gross deformities, no edema SKIN:  no rash, atraumatic   *Additional and/or pertinent findings included in MDM below  Diagnostic and Interventional Summary    EKG Interpretation  Date/Time:    Ventricular Rate:    PR Interval:    QRS Duration:   QT Interval:     QTC Calculation:   R Axis:     Text Interpretation:         Labs Reviewed  URINALYSIS, ROUTINE W REFLEX MICROSCOPIC - Abnormal; Notable for the following components:      Result Value   Color, Urine BROWN (*)    Specific Gravity, Urine >1.030 (*)    Hgb urine dipstick LARGE (*)    Bilirubin Urine SMALL (*)    Protein, ur 30 (*)    All other components within normal limits  COMPREHENSIVE METABOLIC PANEL - Abnormal; Notable for the following components:   Calcium 8.7 (*)    All other components within normal limits  URINALYSIS, MICROSCOPIC (REFLEX) - Abnormal; Notable for the following components:   Bacteria, UA FEW (*)    All other components within normal limits  CBC    No orders to display    Medications  ciprofloxacin (CIPRO) tablet 500 mg (has no administration in time range)     Procedures  /  Critical Care Procedures  ED Course and Medical Decision Making  Initial Impression and Ddx Suspect UTI, also considering glomerulonephritis.  Bladder mass or lesion is considered the patient had a recent CT scan that was normal.  Past medical/surgical history that increases complexity of ED encounter: None  Interpretation of Diagnostics Labs are reassuring with no AKI.  Normal H&H.  Urinalysis with large blood and rare bacteria.  Given this and the suprapubic pain, there is no suspicion to treat for UTI.  Patient Reassessment and Ultimate Disposition/Management Discharge home.  Patient management required discussion with the following services or consulting groups:  None  Complexity of Problems Addressed Acute complicated illness or Injury  Additional Data Reviewed and Analyzed Further history obtained from: Recent PCP notes  Factors Impacting ED Encounter Risk Prescriptions  Barth Kirks. Sedonia Small, MD [place] [email or web address]  Final Clinical Impressions(s) / ED Diagnoses     ICD-10-CM   1. Hemorrhagic  cystitis  N30.91       ED Discharge Orders          Ordered    ciprofloxacin (CIPRO) 500 MG tablet  Every 12 hours        06/13/21 0106             Discharge Instructions Discussed with and Provided to Patient:    Discharge Instructions      You were evaluated in the Emergency Department and after careful evaluation, we did not find any emergent condition requiring admission or further testing in the hospital.  Your exam/testing today was overall reassuring.  We suspect your bleeding is due  to a bladder infection.  Please take the ciprofloxacin antibiotics as directed.  If your bleeding does not go away, recommend follow-up with the urologists.  Please return to the Emergency Department if you experience any worsening of your condition.  Thank you for allowing [place] to be a part of your care.       Maudie Flakes, MD 06/13/21 [place]

## 2021-06-13 NOTE — Discharge Instructions (Signed)
You were evaluated in the Emergency Department and after careful evaluation, we did not find any emergent condition requiring admission or further testing in the hospital.  Your exam/testing today was overall reassuring.  We suspect your bleeding is due to a bladder infection.  Please take the ciprofloxacin antibiotics as directed.  If your bleeding does not go away, recommend follow-up with the urologists.  Please return to the Emergency Department if you experience any worsening of your condition.  Thank you for allowing [place] to be a part of your care.

## 2021-06-15 ENCOUNTER — Telehealth: Payer: Self-pay

## 2021-06-15 ENCOUNTER — Telehealth: Payer: Self-pay | Admitting: Internal Medicine

## 2021-06-15 DIAGNOSIS — R911 Solitary pulmonary nodule: Secondary | ICD-10-CM

## 2021-06-15 NOTE — Telephone Encounter (Signed)
Dr. Maple Hudson please advise    ----- Message from Hardie Pulley sent at 06/15/2021  4:02 PM EST ----- Regarding: Referral Hey,  I have a referral from [facility] here at [facility] for pt to see pulmonary for an incidental pulmonary nodule.  He sees Dr. Maple Hudson and just seen him in January and doesn't come back until July.  The nodule(s) were seen on a recent CT.  I wasn't sure if he needed to come in sooner based off the recommendations to see Dr. Maple Hudson or what we needed to do.  I'm not sure if Mitzi Davenport was aware that he sees Dr. Maple Hudson in [place].   Thanks [employer]

## 2021-06-15 NOTE — Telephone Encounter (Signed)
Pt calling to return phone call

## 2021-06-15 NOTE — Telephone Encounter (Signed)
-----   Message from Hardie Pulley sent at 06/15/2021  4:02 PM EST ----- Regarding: Referral Hey,  I have a referral from [facility] here at [facility] for pt to see pulmonary for an incidental pulmonary nodule.  He sees Dr. Maple Hudson and just seen him in January and doesn't come back until July.  The nodule(s) were seen on a recent CT.  I wasn't sure if he needed to come in sooner based off the recommendations to see Dr. Maple Hudson or what we needed to do.  I'm not sure if Mitzi Davenport was aware that he sees Dr. Maple Hudson in [place].  Thanks [employer]

## 2021-06-16 NOTE — Telephone Encounter (Signed)
I suggest- he keep his pending appointment with me in July for now for pulmonary follow-up.  Meanwhile- please order CT chest no contrast to be done in 4 months for dx bilateral lung nodules, with results to me.  Thanks

## 2021-06-17 NOTE — Telephone Encounter (Signed)
Called and spoke with patient to let him know that Dr. Maple Hudson got message from NP [facility] with [facility] and that Dr. Maple Hudson was ok with him keeping his appt in July but wanted to do a repeat CT in 4 months. Patient expressed understanding. Order has been placed. Nothing further needed at this time.

## 2021-06-19 ENCOUNTER — Other Ambulatory Visit (HOSPITAL_COMMUNITY): Payer: Self-pay

## 2021-06-19 ENCOUNTER — Encounter: Payer: BC Managed Care – PPO | Admitting: Internal Medicine

## 2021-06-19 NOTE — Telephone Encounter (Signed)
Received notification from  [facility]  regarding a prior authorization for [place]. Authorization has been APPROVED from 06/24/2021 to 06/24/2022. Approval letter sent to scan center.  Authorization # [id number]

## 2021-06-19 NOTE — Telephone Encounter (Addendum)
Called [employer] to request fax of Dupixent prior authorization approval letter be sent to our clinic.  Prime Phone: [phone], option 1  Per rep, they closed out previous PA request without realizing it was renewal. They have reopened case and expected turnaround time is 24-48 hours. They state that if they need clinicals, they will reach out to our clinic  Chesley Mires, PharmD, MPH, BCPS Clinical Pharmacist (Rheumatology and Pulmonology)

## 2021-07-07 ENCOUNTER — Other Ambulatory Visit (HOSPITAL_COMMUNITY): Payer: Self-pay

## 2021-07-07 MED FILL — Dupilumab Subcutaneous Soln Auto-injector 300 MG/2ML: SUBCUTANEOUS | 28 days supply | Qty: 4 | Fill #1 | Status: AC

## 2021-07-08 ENCOUNTER — Other Ambulatory Visit (HOSPITAL_COMMUNITY): Payer: Self-pay

## 2021-07-09 ENCOUNTER — Other Ambulatory Visit: Payer: Self-pay | Admitting: Internal Medicine

## 2021-07-31 ENCOUNTER — Other Ambulatory Visit (HOSPITAL_COMMUNITY): Payer: Self-pay

## 2021-08-05 ENCOUNTER — Telehealth: Payer: Self-pay

## 2021-08-05 ENCOUNTER — Other Ambulatory Visit (HOSPITAL_COMMUNITY): Payer: Self-pay

## 2021-08-05 MED FILL — Dupilumab Subcutaneous Soln Auto-injector 300 MG/2ML: SUBCUTANEOUS | 28 days supply | Qty: 4 | Fill #2 | Status: AC

## 2021-08-05 NOTE — Telephone Encounter (Signed)
Received notification from [facility] that copay card stating "Member ID Not On File". Time at which pt was originally enrolled in copay card program does not appear to have ever been noted in Epic, nor was the processing information ever documented. Can confirm that last 4 digits of ID are the same as previous successful adjudications.  ? ?Per billing info from 07/09/21 fill, pt should still have $7667.02 in available funds remaining. Will need to contact Dupixent MyWay for assistance in this matter. ?

## 2021-08-06 ENCOUNTER — Other Ambulatory Visit (HOSPITAL_COMMUNITY): Payer: Self-pay

## 2021-08-06 NOTE — Telephone Encounter (Signed)
Reached out to the [place] card assist line and was informed that the pt would need to contact the help desk in order to have issue resolved. ATC pt and LVM explaining situation and requested that pt return call for additional info. Direct office phone provided. ? ?Phone# [phone] opt 6 ?Alt [phone] ?

## 2021-08-08 ENCOUNTER — Other Ambulatory Visit (HOSPITAL_COMMUNITY): Payer: Self-pay

## 2021-08-10 ENCOUNTER — Ambulatory Visit
Admission: EM | Admit: 2021-08-10 | Discharge: 2021-08-10 | Disposition: A | Discharge status: Home / Self Care | Payer: BC Managed Care – PPO | Attending: Family Medicine | Admitting: Family Medicine

## 2021-08-10 ENCOUNTER — Encounter: Payer: Self-pay | Admitting: Internal Medicine

## 2021-08-10 ENCOUNTER — Ambulatory Visit: Payer: BC Managed Care – PPO | Admitting: Internal Medicine

## 2021-08-10 ENCOUNTER — Ambulatory Visit (INDEPENDENT_AMBULATORY_CARE_PROVIDER_SITE_OTHER): Payer: BC Managed Care – PPO

## 2021-08-10 DIAGNOSIS — R0602 Shortness of breath: Secondary | ICD-10-CM

## 2021-08-10 DIAGNOSIS — R509 Fever, unspecified: Secondary | ICD-10-CM

## 2021-08-10 DIAGNOSIS — J849 Interstitial pulmonary disease, unspecified: Secondary | ICD-10-CM

## 2021-08-10 DIAGNOSIS — J84111 Idiopathic interstitial pneumonia, not otherwise specified: Secondary | ICD-10-CM

## 2021-08-10 DIAGNOSIS — I471 Supraventricular tachycardia: Secondary | ICD-10-CM

## 2021-08-10 DIAGNOSIS — J189 Pneumonia, unspecified organism: Secondary | ICD-10-CM

## 2021-08-10 DIAGNOSIS — J4551 Severe persistent asthma with (acute) exacerbation: Secondary | ICD-10-CM | POA: Diagnosis not present

## 2021-08-10 MED ORDER — PREDNISONE 50 MG PO TABS
ORAL_TABLET | ORAL | 0 refills | Status: DC
Start: 2021-08-10 — End: 2021-09-20

## 2021-08-10 MED ORDER — DOXYCYCLINE HYCLATE 100 MG PO CAPS: 100.0000 mg | ORAL_CAPSULE | Freq: Two times a day (BID) | ORAL | 0 refills | Status: DC

## 2021-08-10 MED ORDER — ALBUTEROL SULFATE (2.5 MG/3ML) 0.083% IN NEBU
2.5000 mg | INHALATION_SOLUTION | Freq: Once | RESPIRATORY_TRACT | Status: AC
  Administered 2021-08-10: 2.5 mg via RESPIRATORY_TRACT

## 2021-08-10 NOTE — Discharge Instructions (Signed)
Your oxygen saturation is low today due to the extent of your pneumonia and asthma exacerbation.  I do recommend that you go to the emergency department, particularly if you do not start significantly improving over the next 12 hours on antibiotics and steroids.  Use your albuterol every 2-4 hours as needed and call your pulmonologist for further instruction and follow-up.  You may buy a home pulse oximeter at the pharmacy, anything below 90 is abnormal and the reason to follow-up immediately. ?

## 2021-08-10 NOTE — Progress Notes (Signed)
? Patient ID: Drew Sandoval, male    DOB: Jul 22, 1975    MRN: [id number] ? ?HPI ?M  former smoker followed for severe asthma/ COPD, chronic sinusitis, hyper-eosinophilia and hyper IgE, complicated by history SVT/cardioversion ?FENO 03/18/16- 100  High, indicates allergic asthma ?Office Spirometry 03/18/2016-severe obstructive airways disease FVC 2.62/51%, FEV1 1.30/31%, FEV1/FVC 0.50, FEF 25-75 0.61/14% ?Office Spirometry 01/25/17-severe obstructive airways disease. FVC 3.52/68%, FEV1 1.80/43%, ratio or 0.51, FEF 25-75% 0.76/18% ?IgE was too high for Xolair. He could not afford Nucala or allergy vaccine. ?Cinqair infusion@ [facility]- ordered 07/21/17 ?---------------------------------------------------- ? ? ?05/21/21- 46 year old male former smoker (5.75 pk yrs) followed for severe asthma/ COPD/ Dupixent, chronic sinusitis, persistent eosinophilia, hyper IgE-too high for Xolair, history SVT/cardioversion/ ablation, Kidney stone, Covid infection Jan, 2022, HTN,  ?-Breztri, Dupixent, Singulair, Ventolin hfa, neb levalbuterol ?Covid vax-2 Phizer ?Flu vax-today ?-----Patient is doing good, no concerns ?Doing very well feeling quite stable on Dupixent.  Needs nebulizer solution refilled and wants flu shot today.  No recent exacerbations.  Far less emergency room time since Dupixent. ?Heart rhythm control has been good and we discussed advantage of not needing as much aggressive bronchodilator use as a help for that issue as well. ? ?08/10/21-  46 year old male former smoker (5.75 pk yrs) followed for severe asthma/ COPD/ Dupixent, chronic sinusitis, persistent eosinophilia, hyper IgE-too high for Xolair, history SVT/cardioversion/ ablation, Kidney stone, Covid infection Jan, 2022, HTN,  ?-Breztri, Dupixent, Singulair, Ventolin hfa, neb levalbuterol ?Covid vax-2 Phizer ?Flu vax-had ?ED today for severe asthma x 1 week, starting with dental extractions. Fever, hypoxemia .> doxycycline, prednisone ?I am concerned from description  that he may have aspirated purulent material from bad tooth and developing aspiration pneumonia. Should respond to abx. Might need extended course. ?CXR 08/10/21-  ?IMPRESSION: ?1. New bibasilar interstitial opacities, concerning for atypical ?infection. ? ?ROS-see HPI  "+" = positive ?Constitutional:    weight loss, night sweats, fevers, chills, fatigue, lassitude. ?HEENT:    headaches, difficulty swallowing, tooth/dental problems, sore throat,  ?     sneezing, itching, ear ache, +nasal congestion, +post nasal drip, snoring ?CV:    chest pain, orthopnea, PND, swelling in lower extremities, anasarca,                                                      dizziness, +palpitations ?Resp:   +shortness of breath with exertion or at rest.   ?            + productive cough,  non-productive cough, coughing up of blood.   ?           change in color of mucus.  +wheezing.   ?Skin:    rash or lesions. ?GI:  No-   heartburn, indigestion, abdominal pain, nausea, vomiting,  ?GU: d. ?MS:   joint pain, stiffness, decreased range of motion, back pain. ?Neuro-     nothing unusual ?Psych:  change in mood or affect.  depression or anxiety.   memory loss. ? ?Objective:  ? Physical Exam ?General- Alert, Oriented, Affect-reserved/ laconic, Distress-none acute;  Tall, thin ?Skin- +tatoos ?Lymphadenopathy- none ?Head- atraumatic ?           Eyes- Gross vision intact, PERRLA, conjunctivae clear secretions ?           Ears- Hearing, canals normal ?  Nose- Clear, no-Septal dev, mucus, polyps, erosion, perforation  ?           Throat- Mallampati II , mucosa clear , drainage- none, tonsils- atrophic ?Neck- flexible , trachea midline, no stridor , thyroid nl, carotid no bruit ?Chest - symmetrical excursion , unlabored ?          Heart/CV- RRR  , no murmur , no gallop  , no rub, nl s1 s2 ?                          - JVD- none , edema- none, stasis changes- none, varices- none ?          Lung-   +Diminished/ clear, unlabored,  Cough+,  dullness-none, rub- none,  Wheeze+mild ?Abd- scaphoid  ?Br/ Gen/ Rectal- Not done, not indicated ?Extrem- cyanosis- none, clubbing, none, atrophy- none, strength- nl ?Neuro- unremarkable ? ? ?   ? ? ? ?

## 2021-08-10 NOTE — ED Triage Notes (Signed)
Pt states he started his SOB started when he went to the dentist on March 27th ? ?Pt states he is experiencing a dry cough as well ? ?Pt states he has been having pain in his right side of his neck where the teeth were extracted ? ?Pt states he as been having a fever of 101.0 to 102.0 and it breaks and then its back  ? ?Pt states he has tried to contact dentist but no call back ?

## 2021-08-10 NOTE — Patient Instructions (Signed)
Get your doxycycline antibiotic started as soon as you can, to treat this pneumonia ? ?Stay well hydrated, get rest and stay warm. ? ?Please call or go to ER if you don't start feeling better. ? ?Order- We will check with pharmacy team here on status of your Dupixent program- we want to continue that. ? ? ?

## 2021-08-10 NOTE — ED Provider Notes (Signed)
?[facility] ? ? ? ?CSN: [id number] ?Arrival date & time: 08/10/21  0911 ? ? ?  ? ?History   ?Chief Complaint ?Chief Complaint  ?Patient presents with  ? Shortness of Breath  ? ? ?HPI ?Drew Sandoval is a 46 y.o. male.  ? ?Presenting today with about a week of progressively worsening shortness of breath, dry hacking cough, wheezing, fevers off and on.  States the day of onset he had gotten some teeth extracted at his dentist, otherwise no recent health changes.  No new sick contacts recently.  History of severe asthma on Breztri, albuterol, Xopenex nebulizers, Singulair followed by pulmonology.  Has not been trying anything additionally for symptoms.  He is a former cigarette smoker. ? ? ?Past Medical History:  ?Diagnosis Date  ? Acute conjunctivitis of right eye 10/07/2014  ? Acute respiratory failure with hypoxia (HCC) 07/23/2013  ? Asthma   ? Asthma   ? Phreesia 06/26/2020  ? Asthma, severe persistent 07/16/2010  ? 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 DC'd by pt 2013- resumed 2013> 1:50 GH; DC'd -[facility] 9/24-9/27/ 2013- Status asthma, no vent. [facility] 3/ 2015- status ast  ? COVID-19 virus infection 06/17/2020  ? January, 2022. No hosp, no MAB  ? Eczema, allergic 07/22/2011  ? Essential tremor 06/27/2020  ? Seasonal allergies   ? Seasonal and perennial allergic rhinitis 07/17/2014  ? FENO 03/18/16- 100  High, indicates allergic asthma  ? Sinusitis, maxillary, 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  ? Sinus CT 12/10/2013 >>>   ? Status asthmaticus 10/19/2010  ? Sustained SVT (HCC) 09/24/2014  ? SVT (supraventricular tachycardia) (HCC) 09/24/2014  ? ? ?Patient Active Problem List  ? Diagnosis Date Noted  ? Hematuria 06/11/2021  ? Periumbilical abdominal pain 06/11/2021  ? Thyroid nodule 11/18/2020  ?  Essential tremor 06/27/2020  ? Hypertension, essential 06/17/2020  ? Gynecomastia, male 04/24/2018  ? Sustained SVT (HCC) 09/24/2014  ? SVT (supraventricular tachycardia) (HCC) 09/24/2014  ? Malnutrition of moderate degree (HCC) 09/24/2014  ? Seasonal and perennial allergic rhinitis 07/17/2014  ? 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 Eczema, allergic 07/22/2011  ? Sinusitis, maxillary, 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  ? 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 Asthma, severe persistent 07/16/2010  ? ? ?Past Surgical History:  ?Procedure Laterality Date  ? None    ? SVT ABLATION N/A 09/07/2019  ? Procedure: SVT ABLATION;  Surgeon: Marinus Maw, MD;  Location: [facility] INVASIVE CV LAB;  Service: Cardiovascular;  Laterality: N/A;  ? ? ? ? ? ?Home Medications   ? ?Prior to Admission medications   ?Medication Sig Start Date End Date Taking? Authorizing Provider  ?doxycycline (VIBRAMYCIN) 100 MG capsule Take 1 capsule (100 mg total) by mouth 2 (two) times daily. 08/10/21  Yes Particia Nearing, PA-C  ?Dupilumab (DUPIXENT) 300 MG/2ML SOPN INJECT 300 MG INTO THE SKIN EVERY 14 DAYS. 06/12/21   Jetty Duhamel D, MD  ?predniSONE (DELTASONE) 50 MG tablet Take 1 tab daily with breakfast 08/10/21  Yes Particia Nearing, PA-C  ?albuterol (VENTOLIN HFA) 108 (90 Base) MCG/ACT inhaler Inhale 2 puffs into the lungs every 4 (four) hours as needed. 05/29/21   Jetty Duhamel D, MD  ?amLODipine (NORVASC) 5 MG tablet Take 1 tablet (5 mg total) by mouth daily. 01/13/21 04/13/21  Marinus Maw,  MD  ?Budeson-Glycopyrrol-Formoterol (BREZTRI AEROSPHERE) 160-9-4.8 MCG/ACT AERO Inhale 2 puffs into the lungs 2 (two) times daily. Rinse mouth 06/01/21   Jetty Duhamel D, MD  ?EPINEPHrine 0.3 mg/0.3 mL IJ SOAJ injection Inject into thigh for severe allergic reaction 04/29/15   Jetty Duhamel D, MD  ?gabapentin (NEURONTIN) 300 MG capsule Take 1 capsule (300 mg total) by mouth 2 (two) times daily. 03/11/21   Tat, Octaviano Batty, DO  ?levalbuterol (XOPENEX) 0.31 MG/3ML nebulizer  solution Take 3 mLs (0.31 mg total) by nebulization every 4 (four) hours as needed for wheezing. 05/21/21   Jetty Duhamel D, MD  ?metoprolol succinate (TOPROL-XL) 25 MG 24 hr tablet TAKE 1 TABLET (25 MG TOTAL) BY MOUTH DAILY. 07/09/21   Leone Brand, NP  ?montelukast (SINGULAIR) 10 MG tablet TAKE 1 TABLET BY MOUTH EVERYDAY AT BEDTIME 01/27/21   Jetty Duhamel D, MD  ?primidone (MYSOLINE) 50 MG tablet 3 in the AM, 2 at night 03/11/21   Tat, Octaviano Batty, DO  ? ? ?Family History ?Family History  ?Problem Relation Age of Onset  ? Emphysema Father   ? Lung cancer Mother   ? Arthritis Sister   ? ? ?Social History ?Social History  ? ?Tobacco Use  ? Smoking status: Former  ?  Packs/day: 0.50  ?  Years: 11.50  ?  Pack years: 5.75  ?  Types: Cigarettes  ?  Quit date: 04/09/2010  ?  Years since quitting: 11.3  ? Smokeless tobacco: Former  ? Tobacco comments:  ?  started at age 20.  1/2 ppd.    ?Vaping Use  ? Vaping Use: Never used  ?Substance Use Topics  ? Alcohol use: Yes  ?  Comment: rarely  ? Drug use: No  ? ? ? ?Allergies   ?Azithromycin, Alvesco [ciclesonide], Anoro ellipta [umeclidinium-vilanterol], Banana, Methylprednisolone, and Penicillins ? ? ?Review of Systems ?Review of Systems ?Per HPI ? ?Physical Exam ?Triage Vital Signs ?ED Triage Vitals  ?Enc Vitals Group  ?   BP 08/10/21 1057 132/82  ?   Pulse Rate 08/10/21 1057 92  ?   Resp 08/10/21 1057 20  ?   Temp 08/10/21 1057 98.3 ?F (36.8 ?C)  ?   Temp Source 08/10/21 1057 Oral  ?   SpO2 08/10/21 1057 90 %  ?   Weight --   ?   Height --   ?   Head Circumference --   ?   Peak Flow --   ?   Pain Score 08/10/21 1100 10  ?   Pain Loc --   ?   Pain Edu? --   ?   Excl. in GC? --   ? ?No data found. ? ?Updated Vital Signs ?BP 132/82 (BP Location: Right Arm)   Pulse 92   Temp 98.3 ?F (36.8 ?C) (Oral)   Resp 20   SpO2 90%  ? ?Visual Acuity ?Right Eye Distance:   ?Left Eye Distance:   ?Bilateral Distance:   ? ?Right Eye Near:   ?Left Eye Near:    ?Bilateral Near:    ? ?Physical  Exam ?Vitals and nursing note reviewed.  ?Constitutional:   ?   Appearance: Normal appearance.  ?HENT:  ?   Head: Atraumatic.  ?   Mouth/Throat:  ?   Mouth: Mucous membranes are moist.  ?   Pharynx: Oropharynx is clear.  ?Eyes:  ?   Extraocular Movements: Extraocular movements intact.  ?   Conjunctiva/sclera: Conjunctivae normal.  ?Cardiovascular:  ?  Rate and Rhythm: Normal rate and regular rhythm.  ?   Heart sounds: Normal heart sounds.  ?Pulmonary:  ?   Effort: Pulmonary effort is normal.  ?   Breath sounds: Wheezing and rales present.  ?   Comments: Mild wheezes bilaterally.  Breathing comfortably on room air, speaking in full sentences.  Rales bilateral bases ?Musculoskeletal:     ?   General: Normal range of motion.  ?   Cervical back: Normal range of motion and neck supple.  ?Skin: ?   General: Skin is warm and dry.  ?Neurological:  ?   General: No focal deficit present.  ?   Mental Status: He is oriented to person, place, and time.  ?Psychiatric:     ?   Mood and Affect: Mood normal.     ?   Thought Content: Thought content normal.     ?   Judgment: Judgment normal.  ? ?UC Treatments / Results  ?Labs ?(all labs ordered are listed, but only abnormal results are displayed) ?Labs Reviewed - No data to display ? ?EKG ? ?Radiology ?DG Chest 2 View ? ?Result Date: 08/10/2021 ?CLINICAL DATA:  Fever and shortness of breath since tooth extraction a week ago. EXAM: CHEST - 2 VIEW COMPARISON:  Chest x-ray dated June 17, 2020. FINDINGS: The heart size and mediastinal contours are within normal limits. Normal pulmonary vascularity. New patchy interstitial opacities at both lung bases. No pleural effusion or pneumothorax. No acute osseous abnormality. IMPRESSION: 1. New bibasilar interstitial opacities, concerning for atypical infection. Electronically Signed   By: Obie DredgeWilliam T Derry M.D.   On: 08/10/2021 11:33   ? ?Procedures ?Procedures (including critical care time) ? ?Medications Ordered in UC ?Medications  ?albuterol  (PROVENTIL) (2.5 MG/3ML) 0.083% nebulizer solution 2.5 mg (2.5 mg Nebulization Given 08/10/21 1133)  ? ?Initial Impression / Assessment and Plan / UC Course  ?I have reviewed the triage vital signs and the nurs

## 2021-08-11 ENCOUNTER — Other Ambulatory Visit (HOSPITAL_COMMUNITY): Payer: Self-pay

## 2021-08-11 ENCOUNTER — Ambulatory Visit: Payer: BC Managed Care – PPO | Admitting: Endocrinology

## 2021-08-12 ENCOUNTER — Telehealth: Payer: Self-pay | Admitting: Internal Medicine

## 2021-08-13 ENCOUNTER — Telehealth: Payer: Self-pay | Admitting: Internal Medicine

## 2021-08-13 ENCOUNTER — Other Ambulatory Visit (HOSPITAL_COMMUNITY): Payer: Self-pay

## 2021-08-13 NOTE — Telephone Encounter (Signed)
Dr. Maple Hudson, please advise on this for pt when you think that he will be okay to return back to work. ?

## 2021-08-13 NOTE — Telephone Encounter (Signed)
Pt came by office to speak with nurse, I let the pt know that reason he has not received callback from a nurse is bc we are waiting on Dr. Maple Hudson to advise, pt made aware that CY is out for the rest of the week, advised pt that office should reach out some time next week once CY returns. He also mentions that it would be ideal for him to return during the last week in April or 09/07/21 due him needing to finish up the medication. [phone] ?

## 2021-08-13 NOTE — Telephone Encounter (Signed)
Received faxed copy of disability forms from [employer].  Emailed to [place] for review.  ?

## 2021-08-14 NOTE — Telephone Encounter (Signed)
Ok to generate a work-note for Mr Huegel indicating he is under our medical care and needs leave of absence, to return to usual  work May 1. ?

## 2021-08-14 NOTE — Telephone Encounter (Signed)
Letter created and sent on behalf of Dr Maple Hudson. Nothing further needed  ?

## 2021-08-17 ENCOUNTER — Other Ambulatory Visit (HOSPITAL_COMMUNITY): Payer: Self-pay

## 2021-08-18 ENCOUNTER — Emergency Department (HOSPITAL_COMMUNITY): Payer: BC Managed Care – PPO

## 2021-08-18 ENCOUNTER — Encounter (HOSPITAL_COMMUNITY): Payer: Self-pay | Admitting: *Deleted

## 2021-08-18 ENCOUNTER — Emergency Department (HOSPITAL_COMMUNITY)
Admission: EM | Admit: 2021-08-18 | Discharge: 2021-08-18 | Disposition: A | Discharge status: Home / Self Care | Payer: BC Managed Care – PPO | Attending: Emergency Medicine | Admitting: Emergency Medicine

## 2021-08-18 DIAGNOSIS — I10 Essential (primary) hypertension: Secondary | ICD-10-CM | POA: Diagnosis not present

## 2021-08-18 DIAGNOSIS — Z7952 Long term (current) use of systemic steroids: Secondary | ICD-10-CM | POA: Diagnosis not present

## 2021-08-18 DIAGNOSIS — J45909 Unspecified asthma, uncomplicated: Secondary | ICD-10-CM | POA: Diagnosis not present

## 2021-08-18 DIAGNOSIS — J189 Pneumonia, unspecified organism: Secondary | ICD-10-CM | POA: Insufficient documentation

## 2021-08-18 DIAGNOSIS — Z79899 Other long term (current) drug therapy: Secondary | ICD-10-CM | POA: Diagnosis not present

## 2021-08-18 DIAGNOSIS — R509 Fever, unspecified: Secondary | ICD-10-CM | POA: Diagnosis not present

## 2021-08-18 DIAGNOSIS — D72829 Elevated white blood cell count, unspecified: Secondary | ICD-10-CM | POA: Diagnosis not present

## 2021-08-18 DIAGNOSIS — Z20822 Contact with and (suspected) exposure to covid-19: Secondary | ICD-10-CM | POA: Diagnosis not present

## 2021-08-18 DIAGNOSIS — Z7951 Long term (current) use of inhaled steroids: Secondary | ICD-10-CM | POA: Insufficient documentation

## 2021-08-18 DIAGNOSIS — R0602 Shortness of breath: Secondary | ICD-10-CM | POA: Diagnosis not present

## 2021-08-18 LAB — CBC WITH DIFFERENTIAL/PLATELET
Basophils Absolute: 0 10*3/uL (ref 0.0–0.1)
Basophils Relative: 0 %
Eosinophils Absolute: 17.6 10*3/uL — ABNORMAL HIGH (ref 0.0–0.5)
Eosinophils Relative: 66 %
HCT: 41.4 % (ref 39.0–52.0)
Hemoglobin: 13.9 g/dL (ref 13.0–17.0)
Lymphocytes Relative: 20 %
Lymphs Abs: 5.3 10*3/uL — ABNORMAL HIGH (ref 0.7–4.0)
MCH: 30.7 pg (ref 26.0–34.0)
MCHC: 33.6 g/dL (ref 30.0–36.0)
MCV: 91.4 fL (ref 80.0–100.0)
Monocytes Absolute: 1.1 10*3/uL — ABNORMAL HIGH (ref 0.1–1.0)
Monocytes Relative: 4 %
Neutro Abs: 2.7 10*3/uL (ref 1.7–7.7)
Neutrophils Relative %: 10 %
Platelets: 326 10*3/uL (ref 150–400)
RBC: 4.53 MIL/uL (ref 4.22–5.81)
RDW: 14.1 % (ref 11.5–15.5)
WBC: 26.7 10*3/uL — ABNORMAL HIGH (ref 4.0–10.5)
nRBC: 0 % (ref 0.0–0.2)

## 2021-08-18 LAB — RESP PANEL BY RT-PCR (FLU A&B, COVID) ARPGX2
Influenza A by PCR: NEGATIVE
Influenza B by PCR: NEGATIVE
SARS Coronavirus 2 by RT PCR: NEGATIVE

## 2021-08-18 LAB — BASIC METABOLIC PANEL
Anion gap: 6 (ref 5–15)
BUN: 13 mg/dL (ref 6–20)
CO2: 26 mmol/L (ref 22–32)
Calcium: 8.8 mg/dL — ABNORMAL LOW (ref 8.9–10.3)
Chloride: 108 mmol/L (ref 98–111)
Creatinine, Ser: 0.84 mg/dL (ref 0.61–1.24)
GFR, Estimated: >60 mL/min (ref >60)
Glucose, Bld: 90 mg/dL (ref 70–99)
Potassium: 3.8 mmol/L (ref 3.5–5.1)
Sodium: 140 mmol/L (ref 135–145)

## 2021-08-18 MED ORDER — ALBUTEROL SULFATE (2.5 MG/3ML) 0.083% IN NEBU
5.0000 mg | INHALATION_SOLUTION | Freq: Once | RESPIRATORY_TRACT | Status: AC
  Administered 2021-08-18: 5 mg via RESPIRATORY_TRACT
  Filled 2021-08-18: qty 6

## 2021-08-18 MED ORDER — LEVOFLOXACIN 750 MG PO TABS
750.0000 mg | ORAL_TABLET | Freq: Once | ORAL | Status: AC
Start: 2021-08-18 — End: 2021-08-18
  Administered 2021-08-18: 750 mg via ORAL
  Filled 2021-08-18: qty 1

## 2021-08-18 MED ORDER — LEVOFLOXACIN 750 MG PO TABS: 750.0000 mg | ORAL_TABLET | Freq: Every day | ORAL | 0 refills | Status: DC

## 2021-08-18 NOTE — Discharge Instructions (Addendum)
We are switching you to a stronger antibiotic called Levaquin.  You have been given today's dose here, this is a once a day medication so you will not need to next dose until tomorrow evening.  You may stop the doxycycline at this time.  Continue your other home medications for your asthma.  Return here if you develop any significant problems with increasing shortness of breath, wheezing or any worsening of your symptoms, fever, weakness. ?

## 2021-08-18 NOTE — ED Triage Notes (Signed)
States he was seen at urgent care a week ago and diagnosed with pneumonia, states he is not getting better ?

## 2021-08-18 NOTE — ED Provider Notes (Signed)
?[facility] ?Provider Note ? ? ?CSN: [id number] ?Arrival date & time: 08/18/21  1434 ? ?  ? ?History ? ?Chief Complaint  ?Patient presents with  ? Pneumonia  ? ? ?Drew Sandoval is a 46 y.o. male with a history significant for severe persistent asthma under the care of Dr. Maple Hudson, also has a history of SVT, hypertension and seasonal allergy presenting for evaluation of persistent shortness of breath, fever to 101 which was last measured 2 days ago, stating his lungs still feel really tight despite completing a course of prednisone 50 mg pulse dosing 2 days ago.  He is also still on doxycycline, day 6 from when he was diagnosed with an atypical pneumonia at a urgent care center on April 5.  He has not felt in any way improved while being on these medications.  He has had a dry cough, feels tight in his chest, and as mentioned fever to 101.  He has found no other relievers for his symptoms.  He is on multiple home medications including Breztri, Singulair and has PON Xopenex, his last breathing treatment was yesterday evening with no significant relief of symptoms.  He also took his biweekly Dupixent injection yesterday but has had no additional relief today. ? ?Of note, patient states that prior to his diagnosis of Pneumonia he had an infected tooth pulled but was not placed on antibiotics and is concerned that this is the source of his current infection. ? ?The history is provided by the patient and medical records.  ? ?  ? ?Home Medications ?Prior to Admission medications   ?Medication Sig Start Date End Date Taking? Authorizing Provider  ?levofloxacin (LEVAQUIN) 750 MG tablet Take 1 tablet (750 mg total) by mouth daily. 08/18/21  Yes Alysa Duca, Raynelle Fanning, PA-C  ?albuterol (VENTOLIN HFA) 108 (90 Base) MCG/ACT inhaler Inhale 2 puffs into the lungs every 4 (four) hours as needed. 05/29/21   Jetty Duhamel D, MD  ?amLODipine (NORVASC) 5 MG tablet Take 1 tablet (5 mg total) by mouth daily. 01/13/21 08/10/21   Marinus Maw, MD  ?Budeson-Glycopyrrol-Formoterol (BREZTRI AEROSPHERE) 160-9-4.8 MCG/ACT AERO Inhale 2 puffs into the lungs 2 (two) times daily. Rinse mouth 06/01/21   Jetty Duhamel D, MD  ?doxycycline (VIBRAMYCIN) 100 MG capsule Take 1 capsule (100 mg total) by mouth 2 (two) times daily. 08/10/21   Particia Nearing, PA-C  ?Dupilumab (DUPIXENT) 300 MG/2ML SOPN INJECT 300 MG INTO THE SKIN EVERY 14 DAYS. 06/12/21   Jetty Duhamel D, MD  ?EPINEPHrine 0.3 mg/0.3 mL IJ SOAJ injection Inject into thigh for severe allergic reaction 04/29/15   Jetty Duhamel D, MD  ?gabapentin (NEURONTIN) 300 MG capsule Take 1 capsule (300 mg total) by mouth 2 (two) times daily. 03/11/21   Tat, Octaviano Batty, DO  ?levalbuterol (XOPENEX) 0.31 MG/3ML nebulizer solution Take 3 mLs (0.31 mg total) by nebulization every 4 (four) hours as needed for wheezing. 05/21/21   Jetty Duhamel D, MD  ?metoprolol succinate (TOPROL-XL) 25 MG 24 hr tablet TAKE 1 TABLET (25 MG TOTAL) BY MOUTH DAILY. 07/09/21   Leone Brand, NP  ?montelukast (SINGULAIR) 10 MG tablet TAKE 1 TABLET BY MOUTH EVERYDAY AT BEDTIME 01/27/21   Jetty Duhamel D, MD  ?predniSONE (DELTASONE) 50 MG tablet Take 1 tab daily with breakfast 08/10/21   Particia Nearing, PA-C  ?primidone (MYSOLINE) 50 MG tablet 3 in the AM, 2 at night 03/11/21   Tat, Octaviano Batty, DO  ?   ? ?Allergies    ?  Azithromycin, Banana, Alvesco [ciclesonide], Anoro ellipta [umeclidinium-vilanterol], Methylprednisolone, and Penicillins   ? ?Review of Systems   ?Review of Systems  ?Constitutional:  Positive for fatigue and fever. Negative for chills.  ?HENT:  Negative for congestion and sore throat.   ?Eyes: Negative.   ?Respiratory:  Positive for cough, chest tightness, shortness of breath and wheezing.   ?Cardiovascular:  Negative for chest pain.  ?Gastrointestinal:  Negative for abdominal pain, nausea and vomiting.  ?Genitourinary: Negative.   ?Musculoskeletal:  Negative for arthralgias, joint swelling and neck pain.   ?Skin: Negative.  Negative for rash and wound.  ?Neurological:  Negative for dizziness, weakness, light-headedness, numbness and headaches.  ?Psychiatric/Behavioral: Negative.    ? ?Physical Exam ?Updated Vital Signs ?BP (!) 148/89 (BP Location: Right Arm)   Pulse 72   Temp 98.2 ?F (36.8 ?C) (Oral)   Resp 20   Ht 6\' 3"  (1.905 m)   Wt 84.4 kg   SpO2 100%   BMI 23.25 kg/m?  ?Physical Exam ?Vitals and nursing note reviewed.  ?Constitutional:   ?   Appearance: He is well-developed.  ?HENT:  ?   Head: Normocephalic and atraumatic.  ?Eyes:  ?   Conjunctiva/sclera: Conjunctivae normal.  ?Cardiovascular:  ?   Rate and Rhythm: Normal rate and regular rhythm.  ?   Heart sounds: Normal heart sounds.  ?Pulmonary:  ?   Effort: Pulmonary effort is normal. Prolonged expiration present. No accessory muscle usage.  ?   Breath sounds: Decreased air movement present. Examination of the right-lower field reveals decreased breath sounds. Examination of the left-lower field reveals decreased breath sounds. Decreased breath sounds present. No wheezing or rhonchi.  ?   Comments: Fine crackles heard throughout bilateral lower lung fields. ?Abdominal:  ?   General: Bowel sounds are normal.  ?   Palpations: Abdomen is soft.  ?   Tenderness: There is no abdominal tenderness.  ?Musculoskeletal:     ?   General: Normal range of motion.  ?   Cervical back: Normal range of motion.  ?Skin: ?   General: Skin is warm and dry.  ?Neurological:  ?   Mental Status: He is alert.  ? ? ?ED Results / Procedures / Treatments   ?Labs ?(all labs ordered are listed, but only abnormal results are displayed) ?Labs Reviewed  ?CBC WITH DIFFERENTIAL/PLATELET - Abnormal; Notable for the following components:  ?    Result Value  ? WBC 26.7 (*)   ? Lymphs Abs 5.3 (*)   ? Monocytes Absolute 1.1 (*)   ? Eosinophils Absolute 17.6 (*)   ? All other components within normal limits  ?BASIC METABOLIC PANEL - Abnormal; Notable for the following components:  ? Calcium  8.8 (*)   ? All other components within normal limits  ?RESP PANEL BY RT-PCR (FLU A&B, COVID) [id number]  ?PATHOLOGIST SMEAR REVIEW  ? ? ?EKG ?EKG Interpretation ? ?Date/Time:  Tuesday August 18 2021 16:11:00 EDT ?Ventricular Rate:  60 ?PR Interval:  206 ?QRS Duration: 84 ?QT Interval:  412 ?QTC Calculation: 412 ?R Axis:   76 ?Text Interpretation: Normal sinus rhythm Minimal voltage criteria for LVH, may be normal variant ( Sokolow-Lyon ) Borderline ECG When compared with ECG of 12-Nov-2020 18:45, Criteria for Septal infarct are no longer Present Confirmed by Linwood DibblesKnapp, Jon [id number]) on 08/18/2021 4:13:35 PM ? ?Radiology ?DG Chest 2 View ? ?Result Date: 08/18/2021 ?CLINICAL DATA:  persistent pneumonia, sob and fever EXAM: CHEST - 2 VIEW COMPARISON:  Chest x-ray 08/10/2021, CT chest 01/05/2015  FINDINGS: The heart and mediastinal contours are within normal limits. Query persistent bilateral airspace and interstitial opacities. No pulmonary edema. No pleural effusion. No pneumothorax. No acute osseous abnormality. IMPRESSION: Query bilateral airspace and interstitial opacities. Followup PA and lateral chest X-ray is recommended in 3-4 weeks following therapy to ensure resolution and exclude underlying malignancy. Electronically Signed   By: Tish Frederickson M.D.   On: 08/18/2021 16:05   ? ?Procedures ?Procedures  ? ? ?Medications Ordered in ED ?Medications  ?levofloxacin (LEVAQUIN) tablet 750 mg (has no administration in time range)  ?albuterol (PROVENTIL) (2.5 MG/3ML) 0.083% nebulizer solution 5 mg (5 mg Nebulization Given 08/18/21 1638)  ? ? ?ED Course/ Medical Decision Making/ A&P ?  ?                        ?Medical Decision Making ?Pt with history and symptoms of acute pneumonia who is not responding to doxycycline.  He has completed a prednisone pulse dosing - 5 days 50 mg, finished 2 days ago, not improved with breathing and sob.  He has not had an oxygen requirement while here in he ambulated in the department without  desaturation or shortness of breath.  He was felt stable for discharge home.  However given that he is on day 6 of his current antibiotic and he is still symptomatic with no appreciable change on today's x-ray we are going to

## 2021-08-18 NOTE — ED Notes (Addendum)
Pt ambulated to restroom and back to room, denies SOB, O2 remained above 94% on ra, PA made aware  ? ?

## 2021-08-20 LAB — PATHOLOGIST SMEAR REVIEW

## 2021-08-24 ENCOUNTER — Telehealth: Payer: Self-pay | Admitting: Internal Medicine

## 2021-08-24 NOTE — Telephone Encounter (Signed)
Called and spoke with patient who is requesting a new letter because it says Mr. Drew Sandoval and not Mr. Drew Sandoval and there was not a date for him to return back to work it just said May 2023. I apologized to patient and have done a new letter for patient. He states that he will come and pick up letter. It has been placed up front for pick up. Nothing further needed at this time.  ?

## 2021-08-25 ENCOUNTER — Encounter: Payer: Self-pay | Admitting: Internal Medicine

## 2021-08-25 NOTE — Assessment & Plan Note (Signed)
Exacerbation, possible aspiration pneumonia. Allergic PCNs and Zith. Will start with doxycycline, but may step up to quinolone. ?

## 2021-08-25 NOTE — Assessment & Plan Note (Signed)
Follows with cardiology ?Watch response to stress ?

## 2021-08-25 NOTE — Telephone Encounter (Signed)
Forms sent back to Dr. Maple Hudson to fill out and sign.  ?

## 2021-08-26 ENCOUNTER — Encounter: Payer: Self-pay | Admitting: Internal Medicine

## 2021-08-26 ENCOUNTER — Telehealth: Payer: Self-pay | Admitting: Internal Medicine

## 2021-08-26 DIAGNOSIS — J690 Pneumonitis due to inhalation of food and vomit: Secondary | ICD-10-CM | POA: Insufficient documentation

## 2021-08-26 NOTE — Telephone Encounter (Signed)
Leave ob absence documentation- I filled out form for LOA 4/3 till May 1. ?

## 2021-08-27 NOTE — Telephone Encounter (Signed)
Forms completed and faxed to [facility]. Copy placed upfront for patient and scan.  ?

## 2021-08-31 ENCOUNTER — Other Ambulatory Visit (HOSPITAL_COMMUNITY): Payer: Self-pay

## 2021-08-31 MED FILL — Dupilumab Subcutaneous Soln Auto-injector 300 MG/2ML: SUBCUTANEOUS | 28 days supply | Qty: 4 | Fill #3 | Status: AC

## 2021-09-07 ENCOUNTER — Other Ambulatory Visit (HOSPITAL_COMMUNITY): Payer: Self-pay

## 2021-09-07 ENCOUNTER — Other Ambulatory Visit: Payer: Self-pay | Admitting: Neurology

## 2021-09-07 DIAGNOSIS — G250 Essential tremor: Secondary | ICD-10-CM

## 2021-09-11 ENCOUNTER — Telehealth: Payer: Self-pay | Admitting: Internal Medicine

## 2021-09-11 ENCOUNTER — Other Ambulatory Visit (HOSPITAL_COMMUNITY): Payer: Self-pay

## 2021-09-11 NOTE — Telephone Encounter (Signed)
Received form from [employer] for continuation of short term disability.  Last form signed by Dr. Maple Hudson on 08/25/21 put return to work date as 09/07/21.  I called patient and he has returned to work and this form is not needed.  I faxed form to [facility] with note that patient has returned to work effective 09/07/21.  Fax# [phone] ?

## 2021-09-18 NOTE — Progress Notes (Deleted)
Assessment/Plan:    1.  Essential Tremor, exacerbated by medication for asthma  -Continue primidone, 3 tablets in the morning, 2 tablets at night   -understands that tremor likely will never be fully under control due to other medications that induce tremor   -cardiology started him on low dose metoprolol for SVT.  It is probably too low dose to help tremor but given severity of asthma, wouldn't want to increase BB even if able.  -start gabapentin, 300 mg twice per day.  -Patient is not interested in surgical therapies, either DBS or focused ultrasound.   2.  Severe Asthma  -Medications for this contribute to tremor  3.  SVT  -Following with cardiology.  Subjective:   Drew Sandoval was seen today in follow up for essential tremor, which is much exacerbated by medication for his asthma.  My previous records were reviewed prior to todays visit.  He remains frustrated with tremor.  He is now on gabapentin along with primidone.  He did not think trihexyphenidyl was helpful.  Medical records have been reviewed since last visit.  He has been in the emergency room with hemorrhagic cystitis, acute exacerbation of his asthma as well as community-acquired pneumonia.  Current prescribed movement disorder medications: Primidone, 50 mg, 3 in the morning, 2 at night  Gabapentin, 300 mg twice per day (started last visit)   PREVIOUS MEDICATIONS:  primidone ; artane (thought caused hand/feet itching) .   ALLERGIES:   Allergies  Allergen Reactions   Azithromycin Rash   Banana Swelling    Throat swelling    Alvesco [Ciclesonide] Rash    Possible reaction- rash   Anoro Ellipta [Umeclidinium-Vilanterol] Rash    Possible- rash   Methylprednisolone Other (See Comments)    unknown   Penicillins Rash    Has patient had a PCN reaction causing immediate rash, facial/tongue/throat swelling, SOB or lightheadedness with hypotension: Yes Has patient had a PCN reaction causing severe rash  involving mucus membranes or skin necrosis: No Has patient had a PCN reaction that required hospitalization Yes Has patient had a PCN reaction occurring within the last 10 years: No If all of the above answers are "NO", then may proceed with Cephalosporin use.      CURRENT MEDICATIONS:  Outpatient Encounter Medications as of 09/22/2021  Medication Sig   albuterol (VENTOLIN HFA) 108 (90 Base) MCG/ACT inhaler Inhale 2 puffs into the lungs every 4 (four) hours as needed.   amLODipine (NORVASC) 5 MG tablet Take 1 tablet (5 mg total) by mouth daily.   Budeson-Glycopyrrol-Formoterol (BREZTRI AEROSPHERE) 160-9-4.8 MCG/ACT AERO Inhale 2 puffs into the lungs 2 (two) times daily. Rinse mouth   doxycycline (VIBRAMYCIN) 100 MG capsule Take 1 capsule (100 mg total) by mouth 2 (two) times daily.   Dupilumab (DUPIXENT) 300 MG/2ML SOPN INJECT 300 MG INTO THE SKIN EVERY 14 DAYS.   EPINEPHrine 0.3 mg/0.3 mL IJ SOAJ injection Inject into thigh for severe allergic reaction   gabapentin (NEURONTIN) 300 MG capsule Take 1 capsule (300 mg total) by mouth 2 (two) times daily.   levalbuterol (XOPENEX) 0.31 MG/3ML nebulizer solution Take 3 mLs (0.31 mg total) by nebulization every 4 (four) hours as needed for wheezing.   levofloxacin (LEVAQUIN) 750 MG tablet Take 1 tablet (750 mg total) by mouth daily.   metoprolol succinate (TOPROL-XL) 25 MG 24 hr tablet TAKE 1 TABLET (25 MG TOTAL) BY MOUTH DAILY.   montelukast (SINGULAIR) 10 MG tablet TAKE 1 TABLET BY MOUTH EVERYDAY AT BEDTIME  predniSONE (DELTASONE) 50 MG tablet Take 1 tab daily with breakfast   primidone (MYSOLINE) 50 MG tablet TAKE 3 TABLETS BY MOUTH IN THE MORNING AND TAKE 2 TABLETS BY MOUTH AT BEDTIME   No facility-administered encounter medications on file as of 09/22/2021.     Objective:    PHYSICAL EXAMINATION:    VITALS:   There were no vitals filed for this visit.    GEN:  The patient appears stated age and is in NAD. HEENT:  Normocephalic,  atraumatic.  The mucous membranes are moist.   Neurological examination:  Orientation: The patient is alert and oriented x3. Cranial nerves: There is good facial symmetry. The speech is fluent and clear. Soft palate rises symmetrically and there is no tongue deviation. Hearing is intact to conversational tone. Sensation: Sensation is intact to light touch throughout Motor: Strength is at least antigravity x4.  Movement examination: Tone: There is normal tone in the UE/LE Abnormal movements: L>R intention tremor.  He has mild trouble with Archimedes spirals.  He is left-hand dominant, and the left hand shakes more than the right. Coordination:  There is no decremation with RAM's Gait and Station: The patient has no difficulty arising out of a deep-seated chair without the use of the hands. The patient's stride length is good I have reviewed and interpreted the following labs independently   Chemistry      Component Value Date/Time   NA 140 08/18/2021 1617   K 3.8 08/18/2021 1617   CL 108 08/18/2021 1617   CO2 26 08/18/2021 1617   BUN 13 08/18/2021 1617   CREATININE 0.84 08/18/2021 1617      Component Value Date/Time   CALCIUM 8.8 (L) 08/18/2021 1617   ALKPHOS 62 06/13/2021 0021   AST 17 06/13/2021 0021   ALT 15 06/13/2021 0021   BILITOT 0.5 06/13/2021 0021      Lab Results  Component Value Date   WBC 26.7 (H) 08/18/2021   HGB 13.9 08/18/2021   HCT 41.4 08/18/2021   MCV 91.4 08/18/2021   PLT 326 08/18/2021   No results found for: TSH   Chemistry      Component Value Date/Time   NA 140 08/18/2021 1617   K 3.8 08/18/2021 1617   CL 108 08/18/2021 1617   CO2 26 08/18/2021 1617   BUN 13 08/18/2021 1617   CREATININE 0.84 08/18/2021 1617      Component Value Date/Time   CALCIUM 8.8 (L) 08/18/2021 1617   ALKPHOS 62 06/13/2021 0021   AST 17 06/13/2021 0021   ALT 15 06/13/2021 0021   BILITOT 0.5 06/13/2021 0021         Cc:  [name], MD

## 2021-09-19 ENCOUNTER — Encounter (HOSPITAL_COMMUNITY): Payer: Self-pay

## 2021-09-19 ENCOUNTER — Other Ambulatory Visit: Payer: Self-pay

## 2021-09-19 ENCOUNTER — Emergency Department (HOSPITAL_COMMUNITY): Payer: BC Managed Care – PPO

## 2021-09-19 DIAGNOSIS — J45909 Unspecified asthma, uncomplicated: Secondary | ICD-10-CM | POA: Insufficient documentation

## 2021-09-19 DIAGNOSIS — Z79899 Other long term (current) drug therapy: Secondary | ICD-10-CM | POA: Diagnosis not present

## 2021-09-19 DIAGNOSIS — R0602 Shortness of breath: Secondary | ICD-10-CM | POA: Diagnosis not present

## 2021-09-19 DIAGNOSIS — J209 Acute bronchitis, unspecified: Secondary | ICD-10-CM | POA: Diagnosis not present

## 2021-09-19 DIAGNOSIS — R059 Cough, unspecified: Secondary | ICD-10-CM | POA: Diagnosis not present

## 2021-09-19 NOTE — ED Triage Notes (Signed)
Cough since Monday; back sore from same. Has not called pcp or lung md.  ?Had pneumonia in April. ?93 % after ambulating to triage ?Has been using nebs at home.    ?

## 2021-09-20 ENCOUNTER — Emergency Department (HOSPITAL_COMMUNITY)
Admission: EM | Admit: 2021-09-20 | Discharge: 2021-09-20 | Disposition: A | Discharge status: Home / Self Care | Payer: BC Managed Care – PPO | Attending: Emergency Medicine | Admitting: Emergency Medicine

## 2021-09-20 DIAGNOSIS — J209 Acute bronchitis, unspecified: Secondary | ICD-10-CM

## 2021-09-20 MED ORDER — PREDNISONE 10 MG PO TABS: 20.0000 mg | ORAL_TABLET | Freq: Two times a day (BID) | ORAL | 0 refills | Status: DC

## 2021-09-20 MED ORDER — LEVOFLOXACIN 750 MG PO TABS: 750.0000 mg | ORAL_TABLET | Freq: Every day | ORAL | 0 refills | Status: DC

## 2021-09-20 MED ORDER — PREDNISONE 20 MG PO TABS
40.0000 mg | ORAL_TABLET | Freq: Once | ORAL | Status: AC
  Administered 2021-09-20: 40 mg via ORAL
  Filled 2021-09-20: qty 2

## 2021-09-20 MED ORDER — LEVOFLOXACIN 750 MG PO TABS
750.0000 mg | ORAL_TABLET | Freq: Once | ORAL | Status: AC
  Administered 2021-09-20: 750 mg via ORAL
  Filled 2021-09-20: qty 1

## 2021-09-20 NOTE — ED Provider Notes (Signed)
?[facility] EMER[facility] DEPARTMENT ?Provider Note ? ? ?CSN: [id number] ?Arrival date & time: 09/19/21  2028 ? ?  ? ?History ? ?Chief Complaint  ?Patient presents with  ? Cough  ? ? ?Drew Sandoval is a 46 y.o. male. ? ?Patient is a 46 year old male with history of asthma, hyper IgE syndrome, eczema, SVT, and recent diagnosis of pneumonia.  Patient followed by pulmonology.  He presents today with complaints of cough and shortness of breath.  This feels similar to the way his symptoms began when he was diagnosed with double pneumonia.  He describes some productive cough, but denies any fevers or chills.  He denies any chest pain or leg swelling.  He has been using his nebulizer at home with some relief.  Cough is productive of yellow sputum. ? ?The history is provided by the patient.  ? ?  ? ?Home Medications ?Prior to Admission medications   ?Medication Sig Start Date End Date Taking? Authorizing Provider  ?albuterol (VENTOLIN HFA) 108 (90 Base) MCG/ACT inhaler Inhale 2 puffs into the lungs every 4 (four) hours as needed. 05/29/21   Jetty Duhamel D, MD  ?amLODipine (NORVASC) 5 MG tablet Take 1 tablet (5 mg total) by mouth daily. 01/13/21 08/10/21  Marinus Maw, MD  ?Budeson-Glycopyrrol-Formoterol (BREZTRI AEROSPHERE) 160-9-4.8 MCG/ACT AERO Inhale 2 puffs into the lungs 2 (two) times daily. Rinse mouth 06/01/21   Jetty Duhamel D, MD  ?doxycycline (VIBRAMYCIN) 100 MG capsule Take 1 capsule (100 mg total) by mouth 2 (two) times daily. 08/10/21   Particia Nearing, PA-C  ?Dupilumab (DUPIXENT) 300 MG/2ML SOPN INJECT 300 MG INTO THE SKIN EVERY 14 DAYS. 06/12/21   Jetty Duhamel D, MD  ?EPINEPHrine 0.3 mg/0.3 mL IJ SOAJ injection Inject into thigh for severe allergic reaction 04/29/15   Jetty Duhamel D, MD  ?gabapentin (NEURONTIN) 300 MG capsule Take 1 capsule (300 mg total) by mouth 2 (two) times daily. 03/11/21   Tat, Octaviano Batty, DO  ?levalbuterol (XOPENEX) 0.31 MG/3ML nebulizer solution Take 3 mLs (0.31 mg total) by  nebulization every 4 (four) hours as needed for wheezing. 05/21/21   [name], MD  ?levofloxacin (LEVAQUIN) 750 MG tablet Take 1 tablet (750 mg total) by mouth daily. 08/18/21   Burgess Amor, PA-C  ?metoprolol succinate (TOPROL-XL) 25 MG 24 hr tablet TAKE 1 TABLET (25 MG TOTAL) BY MOUTH DAILY. 07/09/21   Leone Brand, NP  ?montelukast (SINGULAIR) 10 MG tablet TAKE 1 TABLET BY MOUTH EVERYDAY AT BEDTIME 01/27/21   Jetty Duhamel D, MD  ?predniSONE (DELTASONE) 50 MG tablet Take 1 tab daily with breakfast 08/10/21   Particia Nearing, PA-C  ?primidone (MYSOLINE) 50 MG tablet TAKE 3 TABLETS BY MOUTH IN THE MORNING AND TAKE 2 TABLETS BY MOUTH AT BEDTIME 09/07/21   Tat, Octaviano Batty, DO  ?   ? ?Allergies    ?Azithromycin, Banana, Alvesco [ciclesonide], Anoro ellipta [umeclidinium-vilanterol], Methylprednisolone, and Penicillins   ? ?Review of Systems   ?Review of Systems  ?All other systems reviewed and are negative. ? ?Physical Exam ?Updated Vital Signs ?BP 135/75 (BP Location: Right Arm)   Pulse 78   Temp 98.4 ?F (36.9 ?C) (Oral)   Resp 20   Ht 6\' 3"  (1.905 m)   Wt 83.9 kg   SpO2 95%   BMI 23.12 kg/m?  ?Physical Exam ?Vitals and nursing note reviewed.  ?Constitutional:   ?   General: He is not in acute distress. ?   Appearance: He is well-developed. He is  not diaphoretic.  ?HENT:  ?   Head: Normocephalic and atraumatic.  ?Cardiovascular:  ?   Rate and Rhythm: Normal rate and regular rhythm.  ?   Heart sounds: No murmur heard. ?  No friction rub.  ?Pulmonary:  ?   Effort: Pulmonary effort is normal. No respiratory distress.  ?   Breath sounds: Rhonchi present. No wheezing or rales.  ?   Comments: There are slight expiratory rhonchi bilaterally. ?Abdominal:  ?   General: Bowel sounds are normal. There is no distension.  ?   Palpations: Abdomen is soft.  ?   Tenderness: There is no abdominal tenderness.  ?Musculoskeletal:     ?   General: Normal range of motion.  ?   Cervical back: Normal range of motion and neck  supple.  ?Skin: ?   General: Skin is warm and dry.  ?Neurological:  ?   Mental Status: He is alert and oriented to person, place, and time.  ?   Coordination: Coordination normal.  ? ? ?ED Results / Procedures / Treatments   ?Labs ?(all labs ordered are listed, but only abnormal results are displayed) ?Labs Reviewed - No data to display ? ?EKG ?None ? ?Radiology ?DG Chest 2 View ? ?Result Date: 09/19/2021 ?CLINICAL DATA:  Cough EXAM: CHEST - 2 VIEW COMPARISON:  08/18/2021, 06/17/2020 FINDINGS: Frontal and lateral views of the chest demonstrate an unremarkable cardiac silhouette. Chronic areas of scarring are seen bilaterally, without superimposed airspace disease, effusion, or pneumothorax. No acute bony abnormalities. IMPRESSION: 1. Stable chest, no acute intrathoracic process. Electronically Signed   By: Sharlet Salina M.D.   On: 09/19/2021 21:42   ? ?Procedures ?Procedures  ? ? ?Medications Ordered in ED ?Medications  ?predniSONE (DELTASONE) tablet 40 mg (has no administration in time range)  ?levofloxacin (LEVAQUIN) tablet 750 mg (has no administration in time range)  ? ? ?ED Course/ Medical Decision Making/ A&P ? ?Patient presenting with URI symptoms and wheezing as described in the HPI.  He has history of pneumonia and asthma.  Chest x-ray today is clear, but given his history, I feel as though a course of antibiotics and prednisone is appropriate.  He is to continue his home nebulizer treatments and return to the ER if his symptoms worsen or change.  There is no hypoxia and he is in no respiratory distress and seems appropriate for discharge. ? ?Final Clinical Impression(s) / ED Diagnoses ?Final diagnoses:  ?None  ? ? ?Rx / DC Orders ?ED Discharge Orders   ? ? None  ? ?  ? ? ?  ?Geoffery Lyons, MD ?09/20/21 0021 ? ?

## 2021-09-20 NOTE — Discharge Instructions (Addendum)
Begin taking Levaquin and prednisone as prescribed. ? ?Continue use of your nebulizer every 4 hours as needed. ? ?Return to the emergency department if your symptoms significantly worsen or change. ?

## 2021-09-22 ENCOUNTER — Ambulatory Visit: Payer: BC Managed Care – PPO | Admitting: Neurology

## 2021-10-01 ENCOUNTER — Other Ambulatory Visit (HOSPITAL_COMMUNITY): Payer: Self-pay

## 2021-10-01 MED FILL — Dupilumab Subcutaneous Soln Auto-injector 300 MG/2ML: SUBCUTANEOUS | 28 days supply | Qty: 4 | Fill #4 | Status: AC

## 2021-10-06 ENCOUNTER — Other Ambulatory Visit: Payer: Self-pay | Admitting: Cardiology

## 2021-10-08 ENCOUNTER — Other Ambulatory Visit (HOSPITAL_COMMUNITY): Payer: Self-pay

## 2021-10-08 ENCOUNTER — Ambulatory Visit (INDEPENDENT_AMBULATORY_CARE_PROVIDER_SITE_OTHER): Payer: BC Managed Care – PPO | Admitting: Endocrinology

## 2021-10-08 ENCOUNTER — Encounter: Payer: Self-pay | Admitting: Endocrinology

## 2021-10-08 VITALS — BP 132/90 | HR 75 | Ht 75.0 in | Wt 188.4 lb

## 2021-10-08 DIAGNOSIS — E041 Nontoxic single thyroid nodule: Secondary | ICD-10-CM | POA: Diagnosis not present

## 2021-10-08 LAB — T4, FREE: Free T4: 0.87 ng/dL (ref 0.60–1.60)

## 2021-10-08 LAB — TSH: TSH: 0.47 u[IU]/mL (ref 0.35–5.50)

## 2021-10-08 NOTE — Progress Notes (Unsigned)
Patient ID: Drew Sandoval, male   DOB: Feb 28, 1976, 46 y.o.   MRN: [id number]              Reason for Appointment: Evaluation of thyroid nodule    History of Present Illness:   The patient is being referred by Dr. Jetty Duhamel  The patient's thyroid nodule was first discovered incidentally on a CT scan of his neck done in 7/22 Patient has not noticed any swelling in his neck and does not complain of any pressure sensation or choking He has no difficulty swallowing  He was referred here after an ultrasound but is finally coming here for evaluation  The thyroid ultrasound in 7/22 showed the following  Nodule location: Left; mid   Maximum size: 4.1 cm; Other 2 dimensions: 3.0 x 2.3 cm   Composition: solid/almost completely solid (2)   Echogenicity: isoechoic (1)   Shape: not taller-than-wide (0) Margins: smooth (0) Echogenic foci: none (0)   ACR TI-RADS total points: 3.  No results found for: FREET4, TSH  Allergies as of 10/08/2021       Reactions   Azithromycin Rash   Banana Swelling   Throat swelling   Alvesco [ciclesonide] Rash   Possible reaction- rash   Anoro Ellipta [umeclidinium-vilanterol] Rash   Possible- rash   Methylprednisolone Other (See Comments)   unknown   Penicillins Rash   Has patient had a PCN reaction causing immediate rash, facial/tongue/throat swelling, SOB or lightheadedness with hypotension: Yes Has patient had a PCN reaction causing severe rash involving mucus membranes or skin necrosis: No Has patient had a PCN reaction that required hospitalization Yes Has patient had a PCN reaction occurring within the last 10 years: No If all of the above answers are "NO", then may proceed with Cephalosporin use.        Medication List        Accurate as of October 08, 2021  2:09 PM. If you have any questions, ask your nurse or doctor.          albuterol 108 (90 Base) MCG/ACT inhaler Commonly known as: VENTOLIN HFA Inhale 2 puffs into  the lungs every 4 (four) hours as needed.   amLODipine 5 MG tablet Commonly known as: NORVASC Take 1 tablet (5 mg total) by mouth daily.   Breztri Aerosphere 160-9-4.8 MCG/ACT Aero Generic drug: Budeson-Glycopyrrol-Formoterol Inhale 2 puffs into the lungs 2 (two) times daily. Rinse mouth   doxycycline 100 MG capsule Commonly known as: VIBRAMYCIN Take 1 capsule (100 mg total) by mouth 2 (two) times daily.   Dupixent 300 MG/2ML Sopn Generic drug: Dupilumab INJECT 300 MG INTO THE SKIN EVERY 14 DAYS.   EPINEPHrine 0.3 mg/0.3 mL Soaj injection Commonly known as: EPI-PEN Inject into thigh for severe allergic reaction   gabapentin 300 MG capsule Commonly known as: Neurontin Take 1 capsule (300 mg total) by mouth 2 (two) times daily.   levalbuterol 0.31 MG/3ML nebulizer solution Commonly known as: XOPENEX Take 3 mLs (0.31 mg total) by nebulization every 4 (four) hours as needed for wheezing.   levofloxacin 750 MG tablet Commonly known as: Levaquin Take 1 tablet (750 mg total) by mouth daily. X 7 days   metoprolol succinate 25 MG 24 hr tablet Commonly known as: TOPROL-XL TAKE 1 TABLET (25 MG TOTAL) BY MOUTH DAILY.   montelukast 10 MG tablet Commonly known as: SINGULAIR TAKE 1 TABLET BY MOUTH EVERYDAY AT BEDTIME   predniSONE 10 MG tablet Commonly known as: DELTASONE Take 2 tablets (  20 mg total) by mouth 2 (two) times daily.   primidone 50 MG tablet Commonly known as: MYSOLINE TAKE 3 TABLETS BY MOUTH IN THE MORNING AND TAKE 2 TABLETS BY MOUTH AT BEDTIME        Allergies:  Allergies  Allergen Reactions   Azithromycin Rash   Banana Swelling    Throat swelling    Alvesco [Ciclesonide] Rash    Possible reaction- rash   Anoro Ellipta [Umeclidinium-Vilanterol] Rash    Possible- rash   Methylprednisolone Other (See Comments)    unknown   Penicillins Rash    Has patient had a PCN reaction causing immediate rash, facial/tongue/throat swelling, SOB or lightheadedness  with hypotension: Yes Has patient had a PCN reaction causing severe rash involving mucus membranes or skin necrosis: No Has patient had a PCN reaction that required hospitalization Yes Has patient had a PCN reaction occurring within the last 10 years: No If all of the above answers are "NO", then may proceed with Cephalosporin use.      Past Medical History:  Diagnosis Date   Acute conjunctivitis of right eye 10/07/2014   Acute respiratory failure with hypoxia (HCC) 07/23/2013   Asthma    Asthma    Phreesia 06/26/2020   Asthma, severe persistent 07/16/2010   PFT >> spiro 2012 with FEV1 1.14 (28%), ratio 45 Alpha 1 (08/12/10) >>MM ([facility])  IGE 1747, positive allergy profile >> Elevated eos on CBC S/p intubation 10/2010 Job exposure to respiratory irritant - bleach, chicken Allergy vaccine begun 11/09/2010. DC'd by pt 2013- resumed 2013> 1:50 GH; [facility]'d [facility] 9/24-9/27[facility] 2013- Status asthma, no vent. [facility] 3/ 2015- status ast   COVID-19 virus infection 06/17/2020   January, 2022. No hosp, no MAB   Eczema, allergic 07/22/2011   Essential tremor 06/27/2020   Seasonal allergies    Seasonal and perennial allergic rhinitis 07/17/2014   FENO 03/18/16- 100  High, indicates allergic asthma   Sinusitis, maxillary, 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   Sinus CT 12/10/2013 >>>    Status asthmaticus 10/19/2010   Sustained SVT (HCC) 09/24/2014   SVT (supraventricular tachycardia) (HCC) 09/24/2014    There is no history of radiation to the neck in childhood  Past Surgical History:  Procedure Laterality Date   None     SVT ABLATION N/A 09/07/2019   Procedure: SVT ABLATION;  Surgeon: Marinus Maw, MD;  Location: [facility] INVASIVE CV [facility];  Service: Cardiovascular;  Laterality: N/A;    Family History  Problem Relation Age of Onset   Lung cancer Mother    Emphysema Father    Arthritis Sister    Diabetes Maternal Grandmother    Thyroid disease Neg Hx     Social History:  reports that he quit  smoking about 11 years ago. His smoking use included cigarettes. He has a 5.75 pack-year smoking history. He has quit using smokeless tobacco. He reports current alcohol use. He reports that he does not use drugs.   Review of Systems  Cardiovascular:  Positive for palpitations.  Endocrine: Negative for fatigue and heat intolerance.  No significant weight change recently  Wt Readings from Last 3 Encounters:  10/08/21 188 lb 6.4 oz (85.5 kg)  09/19/21 185 lb (83.9 kg)  08/18/21 186 lb (84.4 kg)      Examination:   BP 132/90   Pulse 75   Ht 6\' 3"  (1.905 m)   Wt 188 lb 6.4 oz (85.5 kg)   SpO2 98%   BMI 23.55 kg/m  General Appearance:  well-looking        Eyes: No abnormal prominence or swelling of the eyes          THYROID: Thyroid nodule is palpable on the left side, better on swallowing The size of the nodule is difficult to a certain but it is about 3 times normal size of the lobe Is slightly firm and nodular Right lobe is not palpable.  There is no lymphadenopathy in the neck   No tremor Biceps reflexes show normal relaxation  Assessment/Plan:  Thyroid nodule: He has an isoechoic 4 cm nodule in the left This categorizes this has been mildly suspicious for malignancy only However since it has been almost a year since the initial diagnosis we can repeat the ultrasound and if there is no change will not need needle aspiration Discussed thyroid nodules, incidence of malignancy generally being about 5% as well as how to needle aspiration biopsy will be done if needed  Since he has not had baseline thyroid levels and to rule out an overactive or autonomous thyroid will check thyroid functions today also   Consultation note sent to the referring physician  Reather Littlerjay Domonique Cothran 10/08/2021  Addendum: Thyroid functions are normal, will proceed with ultrasound  Reather LittlerAjay Fabianna Keats

## 2021-10-17 ENCOUNTER — Other Ambulatory Visit (HOSPITAL_COMMUNITY): Payer: Self-pay

## 2021-10-20 NOTE — Progress Notes (Signed)
Assessment/Plan:    1.  Essential Tremor, exacerbated by medication for asthma  -Continue primidone, 3 tablets in the morning, 2 tablets at night .  Discussed with him that may need to increase the dosage  -understands that tremor likely will never be fully under control due to other medications that induce tremor   -cardiology started him on low dose metoprolol for SVT.  It is probably too low dose to help tremor but given severity of asthma, wouldn't want to increase BB even if able.  -d/c gabapentin  -start cogentin, 1 mg daily.  Discussed anticholinergic risks.  May need to increase dosage.  Told him to call me in 6 weeks  -Patient is not interested in surgical therapies, either DBS or focused ultrasound.   2.  Severe Asthma  -Medications for this contribute to tremor  3.  SVT  -Following with cardiology.  Subjective:   Drew Sandoval was seen today in follow up for essential tremor, which is much exacerbated by medication for his asthma.  My previous records were reviewed prior to todays visit.  He remains frustrated with tremor. He has good and bad days.   He is now on gabapentin along with primidone.  He states that gabapentin makes him sleepy.  He didn't take it today.  He did not think trihexyphenidyl was helpful.  Medical records have been reviewed since last visit.  He has been in the emergency room with hemorrhagic cystitis, acute exacerbation of his asthma as well as community-acquired pneumonia.  Current prescribed movement disorder medications: Primidone, 50 mg, 3 in the morning, 2 at night  Gabapentin, 300 mg twice per day (started last visit)   PREVIOUS MEDICATIONS:  primidone ; artane (thought caused hand/feet itching) .   ALLERGIES:   Allergies  Allergen Reactions   Azithromycin Rash   Banana Swelling    Throat swelling    Alvesco [Ciclesonide] Rash    Possible reaction- rash   Anoro Ellipta [Umeclidinium-Vilanterol] Rash    Possible- rash    Methylprednisolone Other (See Comments)    unknown   Penicillins Rash    Has patient had a PCN reaction causing immediate rash, facial/tongue/throat swelling, SOB or lightheadedness with hypotension: Yes Has patient had a PCN reaction causing severe rash involving mucus membranes or skin necrosis: No Has patient had a PCN reaction that required hospitalization Yes Has patient had a PCN reaction occurring within the last 10 years: No If all of the above answers are "NO", then may proceed with Cephalosporin use.      CURRENT MEDICATIONS:  Outpatient Encounter Medications as of 10/22/2021  Medication Sig   albuterol (VENTOLIN HFA) 108 (90 Base) MCG/ACT inhaler Inhale 2 puffs into the lungs every 4 (four) hours as needed.   amLODipine (NORVASC) 5 MG tablet Take 1 tablet (5 mg total) by mouth daily.   Budeson-Glycopyrrol-Formoterol (BREZTRI AEROSPHERE) 160-9-4.8 MCG/ACT AERO Inhale 2 puffs into the lungs 2 (two) times daily. Rinse mouth   Dupilumab (DUPIXENT) 300 MG/2ML SOPN INJECT 300 MG INTO THE SKIN EVERY 14 DAYS.   EPINEPHrine 0.3 mg/0.3 mL IJ SOAJ injection Inject into thigh for severe allergic reaction   gabapentin (NEURONTIN) 300 MG capsule Take 1 capsule (300 mg total) by mouth 2 (two) times daily.   levalbuterol (XOPENEX) 0.31 MG/3ML nebulizer solution Take 3 mLs (0.31 mg total) by nebulization every 4 (four) hours as needed for wheezing.   montelukast (SINGULAIR) 10 MG tablet TAKE 1 TABLET BY MOUTH EVERYDAY AT BEDTIME  primidone (MYSOLINE) 50 MG tablet TAKE 3 TABLETS BY MOUTH IN THE MORNING AND TAKE 2 TABLETS BY MOUTH AT BEDTIME   [DISCONTINUED] doxycycline (VIBRAMYCIN) 100 MG capsule Take 1 capsule (100 mg total) by mouth 2 (two) times daily.   [DISCONTINUED] levofloxacin (LEVAQUIN) 750 MG tablet Take 1 tablet (750 mg total) by mouth daily. X 7 days (Patient not taking: Reported on 10/08/2021)   [DISCONTINUED] metoprolol succinate (TOPROL-XL) 25 MG 24 hr tablet TAKE 1 TABLET (25 MG  TOTAL) BY MOUTH DAILY.   [DISCONTINUED] predniSONE (DELTASONE) 10 MG tablet Take 2 tablets (20 mg total) by mouth 2 (two) times daily. (Patient not taking: Reported on 10/08/2021)   No facility-administered encounter medications on file as of 10/22/2021.     Objective:    PHYSICAL EXAMINATION:    VITALS:   Vitals:   10/22/21 0809  BP: 118/78  Pulse: 63  SpO2: 99%  Weight: 186 lb (84.4 kg)  Height: 6\' 3"  (1.905 m)      GEN:  The patient appears stated age and is in NAD. HEENT:  Normocephalic, atraumatic.  The mucous membranes are moist.   Neurological examination:  Orientation: The patient is alert and oriented x3. Cranial nerves: There is good facial symmetry. The speech is fluent and clear. Soft palate rises symmetrically and there is no tongue deviation. Hearing is intact to conversational tone. Sensation: Sensation is intact to light touch throughout Motor: Strength is at least antigravity x4.  Movement examination: Tone: There is normal tone in the UE/LE Abnormal movements: L>R intention tremor.  He has mild trouble with Archimedes spirals. This is stable c/t previous Coordination:  There is no decremation with RAM's Gait and Station: The patient has no difficulty arising out of a deep-seated chair without the use of the hands. The patient's stride length is good I have reviewed and interpreted the following labs independently   Chemistry      Component Value Date/Time   NA 140 08/18/2021 1617   K 3.8 08/18/2021 1617   CL 108 08/18/2021 1617   CO2 26 08/18/2021 1617   BUN 13 08/18/2021 1617   CREATININE 0.84 08/18/2021 1617      Component Value Date/Time   CALCIUM 8.8 (L) 08/18/2021 1617   ALKPHOS 62 06/13/2021 0021   AST 17 06/13/2021 0021   ALT 15 06/13/2021 0021   BILITOT 0.5 06/13/2021 0021      Lab Results  Component Value Date   WBC 26.7 (H) 08/18/2021   HGB 13.9 08/18/2021   HCT 41.4 08/18/2021   MCV 91.4 08/18/2021   PLT 326 08/18/2021   Lab  Results  Component Value Date   TSH 0.47 10/08/2021     Chemistry      Component Value Date/Time   NA 140 08/18/2021 1617   K 3.8 08/18/2021 1617   CL 108 08/18/2021 1617   CO2 26 08/18/2021 1617   BUN 13 08/18/2021 1617   CREATININE 0.84 08/18/2021 1617      Component Value Date/Time   CALCIUM 8.8 (L) 08/18/2021 1617   ALKPHOS 62 06/13/2021 0021   AST 17 06/13/2021 0021   ALT 15 06/13/2021 0021   BILITOT 0.5 06/13/2021 0021      Total time spent on today's visit was 20 minutes, including both face-to-face time and nonface-to-face time.  Time included that spent on review of records (prior notes available to me/labs/imaging if pertinent), discussing treatment and goals, answering patient's questions and coordinating care.    Cc:  Patient,  No Pcp Per

## 2021-10-22 ENCOUNTER — Encounter: Payer: Self-pay | Admitting: Neurology

## 2021-10-22 ENCOUNTER — Ambulatory Visit (INDEPENDENT_AMBULATORY_CARE_PROVIDER_SITE_OTHER): Payer: BC Managed Care – PPO | Admitting: Neurology

## 2021-10-22 DIAGNOSIS — G250 Essential tremor: Secondary | ICD-10-CM

## 2021-10-22 MED ORDER — BENZTROPINE MESYLATE 1 MG PO TABS: 1.0000 mg | ORAL_TABLET | Freq: Every day | ORAL | 1 refills | Status: DC

## 2021-10-22 MED ORDER — PRIMIDONE 50 MG PO TABS: ORAL_TABLET | ORAL | 1 refills | Status: DC

## 2021-10-22 NOTE — Patient Instructions (Signed)
Stop gabapentin Continue primidone Start cogentin, 1 mg daily.  Let me know if I need to go up on dose in about 6 weeks.

## 2021-10-29 ENCOUNTER — Other Ambulatory Visit (HOSPITAL_COMMUNITY): Payer: Self-pay

## 2021-10-29 MED FILL — Dupilumab Subcutaneous Soln Auto-injector 300 MG/2ML: SUBCUTANEOUS | 28 days supply | Qty: 4 | Fill #5 | Status: CN

## 2021-11-03 ENCOUNTER — Ambulatory Visit (HOSPITAL_COMMUNITY): Payer: BC Managed Care – PPO

## 2021-11-04 ENCOUNTER — Other Ambulatory Visit (HOSPITAL_COMMUNITY): Payer: Self-pay

## 2021-11-05 ENCOUNTER — Other Ambulatory Visit (HOSPITAL_COMMUNITY): Payer: Self-pay

## 2021-11-06 ENCOUNTER — Other Ambulatory Visit (HOSPITAL_COMMUNITY): Payer: Self-pay

## 2021-11-09 ENCOUNTER — Ambulatory Visit (INDEPENDENT_AMBULATORY_CARE_PROVIDER_SITE_OTHER): Payer: BC Managed Care – PPO

## 2021-11-09 ENCOUNTER — Ambulatory Visit
Admission: EM | Admit: 2021-11-09 | Discharge: 2021-11-09 | Disposition: A | Discharge status: Home / Self Care | Payer: BC Managed Care – PPO | Source: Ambulatory Visit | Attending: Nurse Practitioner | Admitting: Nurse Practitioner

## 2021-11-09 ENCOUNTER — Ambulatory Visit
Admission: RE | Admit: 2021-11-09 | Discharge: 2021-11-09 | Disposition: A | Discharge status: Home / Self Care | Payer: BC Managed Care – PPO | Source: Ambulatory Visit

## 2021-11-09 DIAGNOSIS — J324 Chronic pansinusitis: Secondary | ICD-10-CM | POA: Diagnosis not present

## 2021-11-09 DIAGNOSIS — J4551 Severe persistent asthma with (acute) exacerbation: Secondary | ICD-10-CM

## 2021-11-09 DIAGNOSIS — R059 Cough, unspecified: Secondary | ICD-10-CM

## 2021-11-09 MED ORDER — ALBUTEROL SULFATE (2.5 MG/3ML) 0.083% IN NEBU
2.5000 mg | INHALATION_SOLUTION | Freq: Once | RESPIRATORY_TRACT | Status: AC
  Administered 2021-11-09: 2.5 mg via RESPIRATORY_TRACT

## 2021-11-09 MED ORDER — PREDNISONE 20 MG PO TABS: 40.0000 mg | ORAL_TABLET | Freq: Every day | ORAL | 0 refills | Status: AC

## 2021-11-09 MED ORDER — DOXYCYCLINE HYCLATE 100 MG PO CAPS: 100.0000 mg | ORAL_CAPSULE | Freq: Two times a day (BID) | ORAL | 0 refills | Status: AC

## 2021-11-09 NOTE — Discharge Instructions (Addendum)
-   Chest x-ray today does not show any acute process including no pneumonia -Please start on the prednisone and take it daily in the morning for 5 days; please also start the doxycycline 100 mg and take it twice daily for 7 days for the sinus infection -Follow-up with your pulmonologist if your lung symptoms are not improved after the steroid

## 2021-11-09 NOTE — ED Triage Notes (Signed)
Pt reports nasal congestion and chest congestion x 2 weeks. Robitussin gives no relief.

## 2021-11-09 NOTE — ED Provider Notes (Signed)
[facility]    CSN: [id number] Arrival date & time: 11/09/21  1541      History   Chief Complaint Chief Complaint  Patient presents with   Appointment    1600   Nasal Congestion    HPI Drew Sandoval is a 46 y.o. male.   Patient presents with 2 weeks of congested cough, wheezing, chest tightness, nasal congestion, sinus pressure, headache, and increased fatigue.  Denies shortness of breath, chest pain, runny nose, sneezing, sore throat, tooth or ear pain, rash, abdominal pain, nausea/vomiting, diarrhea, and decreased appetite.  He has been taking Robitussin without relief.  Patient also reports a history of severe asthma, chronic maxillary sinusitis, allergic eosinophilia, allergic eczema, allergic rhinitis, and aspiration pneumonia.  Recently was treated for atypical pneumonia-never had follow-up chest x-ray that he knows of.  Within the past few months, he has been treated with 2 prednisone rounds, a round of doxycycline, and 2 rounds of levofloxacin for pneumonia.  Reports that his pulmonologist recently changed his asthma medication around.  Reports he is taking his medication as prescribed.     Past Medical History:  Diagnosis Date   Acute conjunctivitis of right eye 10/07/2014   Acute respiratory failure with hypoxia (HCC) 07/23/2013   Asthma    Asthma    Phreesia 06/26/2020   Asthma, severe persistent 07/16/2010   PFT >> spiro 2012 with FEV1 1.14 (28%), ratio 45 Alpha 1 (08/12/10) >>MM ([facility])  IGE 1747, positive allergy profile >> Elevated eos on CBC S/p intubation 10/2010 Job exposure to respiratory irritant - bleach, chicken Allergy vaccine begun 11/09/2010. DC'd by pt 2013- resumed 2013> 1:50 GH; DC'd [facility] 9/24-9/27/ 2013- Status asthma, no vent. [facility] 3/ 2015- status ast   COVID-19 virus infection 06/17/2020   January, 2022. No hosp, no MAB   Eczema, allergic 07/22/2011   Essential tremor 06/27/2020   Seasonal allergies     Seasonal and perennial allergic rhinitis 07/17/2014   FENO 03/18/16- 100  High, indicates allergic asthma   Sinusitis, maxillary, 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   Sinus CT 12/10/2013 >>>    Status asthmaticus 10/19/2010   Sustained SVT (HCC) 09/24/2014   SVT (supraventricular tachycardia) (HCC) 09/24/2014    Patient Active Problem List   Diagnosis Date Noted   Aspiration pneumonia (HCC) 08/26/2021   Hematuria 06/11/2021   Periumbilical abdominal pain 06/11/2021   Thyroid nodule 11/18/2020   Essential tremor 06/27/2020   Hypertension, essential 06/17/2020   Gynecomastia, male 04/24/2018   Sustained SVT (HCC) 09/24/2014   SVT (supraventricular tachycardia) (HCC) 09/24/2014   Malnutrition of moderate degree (HCC) 09/24/2014   Seasonal and perennial allergic rhinitis 07/17/2014   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 Authorizing Provider  doxycycline (VIBRAMYCIN) 100 MG capsule Take 1 capsule (100 mg total) by mouth 2 (two) times daily for 7 days. 11/09/21 11/16/21 Yes Valentino Nose, NP  predniSONE (DELTASONE) 20 MG tablet Take 2 tablets (40 mg total) by mouth daily for 5  days. 11/09/21 11/14/21 Yes Valentino Nose, NP  albuterol (VENTOLIN HFA) 108 (90 Base) MCG/ACT inhaler Inhale 2 puffs into the lungs every 4 (four) hours as needed. 05/29/21   Jetty Duhamel D, MD  amLODipine (NORVASC) 5 MG tablet Take 1 tablet (5 mg total) by mouth daily. 01/13/21 10/22/21  Marinus Maw, MD  benztropine (COGENTIN) 1 MG tablet Take 1 tablet (1  mg total) by mouth daily. 10/22/21   Tat, Octaviano Batty, DO  Budeson-Glycopyrrol-Formoterol (BREZTRI AEROSPHERE) 160-9-4.8 MCG/ACT AERO Inhale 2 puffs into the lungs 2 (two) times daily. Rinse mouth 06/01/21   Jetty Duhamel D, MD  Dupilumab (DUPIXENT) 300 MG/2ML SOPN INJECT 300 MG INTO THE SKIN EVERY 14 DAYS. 06/12/21   Jetty Duhamel D, MD  EPINEPHrine 0.3 mg/0.3 mL IJ SOAJ injection Inject into thigh for severe allergic reaction 04/29/15   Young, Joni Fears D, MD  levalbuterol (XOPENEX) 0.31 MG/3ML nebulizer solution Take 3 mLs (0.31 mg total) by nebulization every 4 (four) hours as needed for wheezing. 05/21/21   [name], MD  montelukast (SINGULAIR) 10 MG tablet TAKE 1 TABLET BY MOUTH EVERYDAY AT BEDTIME 01/27/21   Young, Joni Fears D, MD  primidone (MYSOLINE) 50 MG tablet TAKE 3 TABLETS BY MOUTH IN THE MORNING AND TAKE 2 TABLETS BY MOUTH AT BEDTIME 10/22/21   Tat, Octaviano Batty, DO    Family History Family History  Problem Relation Age of Onset   Lung cancer Mother    Emphysema Father    Arthritis Sister    Diabetes Maternal Grandmother    Thyroid disease Neg Hx     Social History Social History   Tobacco Use   Smoking status: Former    Packs/day: 0.50    Years: 11.50    Total pack years: 5.75    Types: Cigarettes    Quit date: 04/09/2010    Years since quitting: 11.5   Smokeless tobacco: Former   Tobacco comments:    started at age 72.  1/2 ppd.    Vaping Use   Vaping Use: Never used  Substance Use Topics   Alcohol use: Yes    Comment: rarely   Drug use: No     Allergies   Azithromycin, Banana, Alvesco [ciclesonide], Anoro ellipta [umeclidinium-vilanterol], Methylprednisolone, and Penicillins   Review of Systems Review of Systems Per HPI  Physical Exam Triage Vital Signs ED Triage Vitals  Enc Vitals Group     BP 11/09/21 1639 128/85     Pulse Rate 11/09/21 1639 83     Resp 11/09/21 1639 18     Temp 11/09/21 1639 98.6 F (37 C)     Temp Source 11/09/21 1639 Oral      SpO2 11/09/21 1639 98 %     Weight --      Height --      Head Circumference --      Peak Flow --      Pain Score 11/09/21 1637 0     Pain Loc --      Pain Edu? --      Excl. in GC? --    No data found.  Updated Vital Signs BP 128/85 (BP Location: Right Arm)   Pulse 83   Temp 98.6 F (37 C) (Oral)   Resp 18   SpO2 98%   Visual Acuity Right Eye Distance:   Left Eye Distance:   Bilateral Distance:    Right Eye Near:   Left Eye Near:    Bilateral Near:  Physical Exam Vitals and nursing note reviewed.  Constitutional:      General: He is not in acute distress.    Appearance: Normal appearance. He is not toxic-appearing.  HENT:     Head: Normocephalic and atraumatic.     Right Ear: Ear canal and external ear normal. There is impacted cerumen.     Left Ear: Tympanic membrane, ear canal and external ear normal. There is no impacted cerumen.     Nose: Congestion present. No rhinorrhea.     Right Sinus: No maxillary sinus tenderness or frontal sinus tenderness.     Left Sinus: Maxillary sinus tenderness and frontal sinus tenderness present.     Mouth/Throat:     Mouth: Mucous membranes are moist.     Dentition: Abnormal dentition. Dental caries present.     Pharynx: Oropharynx is clear.  Eyes:     General: No scleral icterus.    Extraocular Movements: Extraocular movements intact.  Cardiovascular:     Rate and Rhythm: Normal rate and regular rhythm.  Pulmonary:     Effort: Pulmonary effort is normal.     Breath sounds: Transmitted upper airway sounds present. Examination of the right-upper field reveals wheezing. Examination of the left-upper field reveals wheezing. Wheezing present.  Musculoskeletal:     Cervical back: Normal range of motion.  Lymphadenopathy:     Cervical: No cervical adenopathy.  Skin:    General: Skin is warm and dry.     Coloration: Skin is not jaundiced or pale.     Findings: No erythema.  Neurological:     Mental Status: He is alert and  oriented to person, place, and time.      UC Treatments / Results  Labs (all labs ordered are listed, but only abnormal results are displayed) Labs Reviewed - No data to display  EKG   Radiology DG Chest 2 View  Result Date: 11/09/2021 CLINICAL DATA:  Cough for 2 weeks EXAM: CHEST - 2 VIEW COMPARISON:  Chest x-rays dated 09/19/2021 and 07/28/2019. FINDINGS: Heart size and mediastinal contours are within normal limits. Mildly prominent interstitial markings are again seen bilaterally, stable. Chronic bronchitic changes again noted within the perihilar regions bilaterally. Lungs are clear. No pleural effusion or pneumothorax is seen. IMPRESSION: 1. No active cardiopulmonary disease. No evidence of pneumonia or pulmonary edema. 2. 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 Electronically Signed   By: Bary Richard M.D.   On: 11/09/2021 17:04    Procedures Procedures (including critical care time)  Medications Ordered in UC Medications  albuterol (PROVENTIL) (2.5 MG/3ML) 0.083% nebulizer solution 2.5 mg (2.5 mg Nebulization Given 11/09/21 1714)    Initial Impression / Assessment and Plan / UC Course  I have reviewed the triage vital signs and the nursing notes.  Pertinent labs & imaging results that were available during my care of the patient were reviewed by me and considered in my medical decision making (see chart for details).    Patient is a very pleasant, well-appearing 46 year old male presenting for sinus infection and asthma exacerbation.  Chest x-ray today does not show active cardiopulmonary disease; there are chronic bronchitic changes.  Lung sounds are much improved after albuterol nebulizer in urgent care today.  Encouraged use of nebulizer at home every 4-6 hours as needed.  Start prednisone 40 mg daily in the morning for 5 days along with doxycycline 100 mg twice daily for 7 days for the sinus infection.  Encourage close  follow-up with  pulmonology if symptoms persist despite treatment.  The patient was given the opportunity to ask questions.  All questions answered to their satisfaction.  The patient is in agreement to this plan.   Final Clinical Impressions(s) / UC Diagnoses   Final diagnoses:  Severe persistent asthma with acute exacerbation  Chronic pansinusitis     Discharge Instructions      - Chest x-ray today does not show any acute process including no pneumonia -Please start on the prednisone and take it daily in the morning for 5 days; please also start the doxycycline 100 mg and take it twice daily for 7 days for the sinus infection -Follow-up with your pulmonologist if your lung symptoms are not improved after the steroid     ED Prescriptions     Medication Sig Dispense Auth. Provider   doxycycline (VIBRAMYCIN) 100 MG capsule Take 1 capsule (100 mg total) by mouth 2 (two) times daily for 7 days. 14 capsule Cathlean Marseilles A, NP   predniSONE (DELTASONE) 20 MG tablet Take 2 tablets (40 mg total) by mouth daily for 5 days. 10 tablet Valentino Nose, NP      PDMP not reviewed this encounter.   Valentino Nose, NP 11/09/21 1742

## 2021-11-16 ENCOUNTER — Telehealth: Payer: Self-pay

## 2021-11-16 ENCOUNTER — Other Ambulatory Visit (HOSPITAL_COMMUNITY): Payer: Self-pay

## 2021-11-16 MED FILL — Dupilumab Subcutaneous Soln Auto-injector 300 MG/2ML: SUBCUTANEOUS | 28 days supply | Qty: 4 | Fill #5 | Status: CN

## 2021-11-16 NOTE — Telephone Encounter (Signed)
Received notification from [facility] that pt's copay card has run out funds. Will reach out to our [facility] to verify next steps. Will also send MyChart message to inform pt of situation and explain that it is currently being looked in to.

## 2021-11-19 ENCOUNTER — Ambulatory Visit: Payer: BC Managed Care – PPO | Admitting: Internal Medicine

## 2021-11-23 NOTE — Telephone Encounter (Signed)
Patient calling requesting to speak with pharmacy to discuss Dupixent. States he has been out for a month? Please advise.

## 2021-11-24 ENCOUNTER — Other Ambulatory Visit (HOSPITAL_COMMUNITY): Payer: Self-pay

## 2021-11-24 ENCOUNTER — Ambulatory Visit
Admission: RE | Admit: 2021-11-24 | Discharge: 2021-11-24 | Disposition: A | Discharge status: Home / Self Care | Payer: BC Managed Care – PPO | Source: Ambulatory Visit | Attending: Nurse Practitioner | Admitting: Nurse Practitioner

## 2021-11-24 VITALS — BP 135/83 | HR 74 | Temp 97.7°F | Resp 18

## 2021-11-24 DIAGNOSIS — J45901 Unspecified asthma with (acute) exacerbation: Secondary | ICD-10-CM

## 2021-11-24 MED ORDER — PREDNISONE 20 MG PO TABS: 40.0000 mg | ORAL_TABLET | Freq: Every day | ORAL | 0 refills | Status: AC

## 2021-11-24 NOTE — Telephone Encounter (Signed)
Returned call to pt, LVM stating that I had sent him messages on MyChart stating that his copay card was likely out of funds and that he would need to renew. Requested that he either call me back (direct number provided) or reach out to the pharmacy directly to provide new card info if he has already called them. Will await pt to f/u.

## 2021-11-24 NOTE — Discharge Instructions (Addendum)
Take medication scribed. As discussed, you will need to follow-up with pulmonology to determine if there is an alternative medication to help with your asthma symptoms. Recommend using a humidifier in your home especially during sleep to help with cough symptoms. Increase fluids and allow for plenty of rest. Continue current asthma medications to include your inhalers, nebulizer, and daily medication. Go to the emergency department if you develop worsening shortness of breath, difficulty breathing, inability to speak in a complete sentence, or other concerns.

## 2021-11-24 NOTE — ED Provider Notes (Signed)
[facility]    CSN: [id number] Arrival date & time: 11/24/21  0854      History   Chief Complaint Chief Complaint  Patient presents with   Cough    I am asthmatic, have been coughing uncontrollably to the point I feel like I can vomit. I am having trouble sleeping due to coughing and having trouble breathing. - Entered by patient    HPI Drew Sandoval is a 46 y.o. male.   The history is provided by the patient.   Patient presents for persistent cough that has been present for the past 2 to 3 weeks.  Patient states cough is triggering his asthma symptoms.  Patient was seen in this clinic on 11/09/2021 and prescribed doxycycline and prednisone.  Patient states "my symptoms are worse".  He states the cough is worsened to the point where it almost makes him vomit.  He has not followed up with pulmonology at this time as he states it is difficult to get in with his doctor because his doctor is retiring.  He states that he also has not taken his Dupixent in 1 month due to the cost of the co-pay.  States he has been unable to use his nebulizer because it makes him cough.  He continues to complain of cough, wheezing, shortness of breath, and chest tightness.  He denies fever, chills, sore throat, or GI symptoms.  Past Medical History:  Diagnosis Date   Acute conjunctivitis of right eye 10/07/2014   Acute respiratory failure with hypoxia (HCC) 07/23/2013   Asthma    Asthma    Phreesia 06/26/2020   Asthma, severe persistent 07/16/2010   PFT >> spiro 2012 with FEV1 1.14 (28%), ratio 45 Alpha 1 (08/12/10) >>MM ([facility])  IGE 1747, positive allergy profile >> Elevated eos on CBC S/p intubation 10/2010 Job exposure to respiratory irritant - bleach, chicken Allergy vaccine begun 11/09/2010. DC'd by pt 2013- resumed 2013> 1:50 GH; DC'd [facility] 9/24-9/27/ 2013- Status asthma, no vent. [facility] 3/ 2015- status ast   COVID-19 virus infection 06/17/2020   January, 2022. No  hosp, no MAB   Eczema, allergic 07/22/2011   Essential tremor 06/27/2020   Seasonal allergies    Seasonal and perennial allergic rhinitis 07/17/2014   FENO 03/18/16- 100  High, indicates allergic asthma   Sinusitis, maxillary, 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   Sinus CT 12/10/2013 >>>    Status asthmaticus 10/19/2010   Sustained SVT (HCC) 09/24/2014   SVT (supraventricular tachycardia) (HCC) 09/24/2014    Patient Active Problem List   Diagnosis Date Noted   Aspiration pneumonia (HCC) 08/26/2021   Hematuria 06/11/2021   Periumbilical abdominal pain 06/11/2021   Thyroid nodule 11/18/2020   Essential tremor 06/27/2020   Hypertension, essential 06/17/2020   Gynecomastia, male 04/24/2018   Sustained SVT (HCC) 09/24/2014   SVT (supraventricular tachycardia) (HCC) 09/24/2014   Malnutrition of moderate degree (HCC) 09/24/2014   Seasonal and perennial allergic rhinitis 07/17/2014   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 Authorizing Provider  predniSONE (DELTASONE) 20 MG tablet Take 2 tablets (40 mg total) by mouth daily with breakfast for 5 days. 11/24/21 11/29/21 Yes Hawkin Charo-Warren, Sadie Haber, NP  albuterol (VENTOLIN HFA) 108 (90 Base) MCG/ACT inhaler Inhale 2 puffs into the lungs every 4 (four) hours as needed. 05/29/21   Jetty Duhamel D, MD  amLODipine (NORVASC) 5 MG tablet Take 1 tablet (5 mg total) by mouth daily. 01/13/21 10/22/21  Marinus Maw, MD  benztropine (COGENTIN) 1 MG tablet Take 1 tablet (1 mg total) by mouth daily. 10/22/21    Tat, Octaviano Batty, DO  Budeson-Glycopyrrol-Formoterol (BREZTRI AEROSPHERE) 160-9-4.8 MCG/ACT AERO Inhale 2 puffs into the lungs 2 (two) times daily. Rinse mouth 06/01/21   Jetty Duhamel D, MD  Dupilumab (DUPIXENT) 300 MG/2ML SOPN INJECT 300 MG INTO THE SKIN EVERY 14 DAYS. 06/12/21   Jetty Duhamel D, MD  EPINEPHrine 0.3 mg/0.3 mL IJ SOAJ injection Inject into thigh for severe allergic reaction 04/29/15   Young, Joni Fears D, MD  levalbuterol (XOPENEX) 0.31 MG/3ML nebulizer solution Take 3 mLs (0.31 mg total) by nebulization every 4 (four) hours as needed for wheezing. 05/21/21   [name], MD  montelukast (SINGULAIR) 10 MG tablet TAKE 1 TABLET BY MOUTH EVERYDAY AT BEDTIME 01/27/21   Young, Joni Fears D, MD  primidone (MYSOLINE) 50 MG tablet TAKE 3 TABLETS BY MOUTH IN THE MORNING AND TAKE 2 TABLETS BY MOUTH AT BEDTIME 10/22/21   Tat, Octaviano Batty, DO    Family History Family History  Problem Relation Age of Onset   Lung cancer Mother    Emphysema Father    Arthritis Sister    Diabetes Maternal Grandmother    Thyroid disease Neg Hx     Social History Social History   Tobacco Use   Smoking status: Former    Packs/day: 0.50    Years: 11.50    Total pack years: 5.75    Types: Cigarettes    Quit date: 04/09/2010    Years since quitting: 11.6   Smokeless tobacco: Former   Tobacco comments:    started at age 72.  1/2 ppd.    Vaping Use   Vaping Use: Never used  Substance Use Topics   Alcohol use: Yes    Comment: rarely   Drug use: No     Allergies   Azithromycin, Banana, [place] [ciclesonide], Anoro ellipta [umeclidinium-vilanterol], Methylprednisolone, and Penicillins   Review of Systems Review of Systems Per HPI  Physical Exam Triage Vital Signs ED Triage Vitals [11/24/21 0913]  Enc Vitals Group     BP 135/83     Pulse Rate 74     Resp 18     Temp 97.7 F (36.5 C)     Temp Source Oral     SpO2 95 %     Weight      Height      Head Circumference      Peak Flow      Pain  Score 10     Pain Loc      Pain Edu?      Excl. in GC?    No data found.  Updated Vital Signs BP 135/83 (BP Location: Right Arm)   Pulse 74   Temp 97.7 F (36.5 C) (Oral)   Resp 18   SpO2 95%   Visual Acuity Right Eye Distance:   Left Eye Distance:   Bilateral Distance:    Right Eye Near:   Left Eye Near:  Bilateral Near:     Physical Exam Vitals and nursing note reviewed.  Constitutional:      General: He is not in acute distress.    Appearance: Normal appearance.  HENT:     Head: Normocephalic.     Right Ear: Tympanic membrane, ear canal and external ear normal.     Left Ear: Tympanic membrane, ear canal and external ear normal.     Nose: Nose normal.     Mouth/Throat:     Mouth: Mucous membranes are moist.  Eyes:     Extraocular Movements: Extraocular movements intact.     Conjunctiva/sclera: Conjunctivae normal.     Pupils: Pupils are equal, round, and reactive to light.  Cardiovascular:     Rate and Rhythm: Normal rate and regular rhythm.     Pulses: Normal pulses.     Heart sounds: Normal heart sounds.  Pulmonary:     Effort: Pulmonary effort is normal. No tachypnea, accessory muscle usage, respiratory distress or retractions.     Breath sounds: No decreased air movement. Examination of the right-upper field reveals wheezing. Examination of the right-lower field reveals wheezing. Wheezing present.  Abdominal:     General: Bowel sounds are normal.     Palpations: Abdomen is soft.     Tenderness: There is no abdominal tenderness.  Musculoskeletal:     Cervical back: Normal range of motion.  Skin:    General: Skin is warm and dry.  Neurological:     General: No focal deficit present.     Mental Status: He is alert and oriented to person, place, and time.  Psychiatric:        Mood and Affect: Mood normal.        Behavior: Behavior normal.      UC Treatments / Results  Labs (all labs ordered are listed, but only abnormal results are  displayed) Labs Reviewed - No data to display  EKG   Radiology No results found.  Procedures Procedures (including critical care time)  Medications Ordered in UC Medications - No data to display  Initial Impression / Assessment and Plan / UC Course  I have reviewed the triage vital signs and the nursing notes.  Pertinent labs & imaging results that were available during my care of the patient were reviewed by me and considered in my medical decision making (see chart for details).  Presents with cough and asthma exacerbation that is been present for the past 2 to 3 weeks.  Patient was seen in this clinic on 11/09/2021 for the same or similar symptoms.  On exam, patient has wheezing in the right upper and right lower lobes.  His vital signs are stable, he is in no acute distress.  We will not repeat an x-ray today as patient just had x-ray 2 weeks ago and he is stable.  Discussion with patient regarding the importance of following up with pulmonology to discuss his current medication regimen or changes that may be needed due to cost.  We will start patient on prednisone for 5 days.  Supportive care recommendations were provided to the patient with strict indications of when to go to the emergency department.  Patient advised to follow-up as needed. Final Clinical Impressions(s) / UC Diagnoses   Final diagnoses:  Asthma with acute exacerbation, unspecified asthma severity, unspecified whether persistent     Discharge Instructions      Take medication scribed. As discussed, you will need to follow-up with pulmonology to determine if there is  an alternative medication to help with your asthma symptoms. Recommend using a humidifier in your home especially during sleep to help with cough symptoms. Increase fluids and allow for plenty of rest. Continue current asthma medications to include your inhalers, nebulizer, and daily medication. Go to the emergency department if you develop  worsening shortness of breath, difficulty breathing, inability to speak in a complete sentence, or other concerns.     ED Prescriptions     Medication Sig Dispense Auth. Provider   predniSONE (DELTASONE) 20 MG tablet Take 2 tablets (40 mg total) by mouth daily with breakfast for 5 days. 10 tablet Abisai Coble-Warren, Sadie Haber, NP      PDMP not reviewed this encounter.   Abran Cantor, NP 11/24/21 [phone]

## 2021-11-24 NOTE — ED Triage Notes (Signed)
Cough since July 4th.  States the cough triggers his asthma.  States sometimes he vomits due to the cough.  Coughing up clear thick sputum.

## 2021-11-25 ENCOUNTER — Other Ambulatory Visit (HOSPITAL_COMMUNITY): Payer: Self-pay

## 2021-11-26 ENCOUNTER — Other Ambulatory Visit (HOSPITAL_COMMUNITY): Payer: Self-pay

## 2021-11-26 MED FILL — Dupilumab Subcutaneous Soln Auto-injector 300 MG/2ML: SUBCUTANEOUS | 28 days supply | Qty: 4 | Fill #5 | Status: AC

## 2021-11-26 NOTE — Telephone Encounter (Signed)
Spoke with pt this morning, he says he spoke with the copay card company and they gave him the same information for the debit card that he already has on file with [facility].  Reached out to [place] at [facility] and asked her to attempt running the card ending in [id number]. Payment was declined. Verlon Au then attempted to rerun the copay card and received a "member not on file" rejection. She followed up with the copay card company and learned that the pt now has a new ID# for his copay card. She re-ran the copay card and then billed it to the debit card and payment was successfully confirmed. She stated that she would get medication ready to be shipped out today.  Nothing further needed at this time, however we will need to keep an eye on the refill process for next month.

## 2021-11-30 ENCOUNTER — Ambulatory Visit: Payer: BC Managed Care – PPO | Admitting: Primary Care

## 2021-12-09 NOTE — Progress Notes (Unsigned)
Patient ID: Drew Sandoval, male    DOB: 1975/06/03    MRN: [id number]  HPI M  former smoker followed for severe asthma/ COPD, chronic sinusitis, hyper-eosinophilia and hyper IgE, complicated by history SVT/cardioversion FENO 03/18/16- 100  High, indicates allergic asthma Office Spirometry 03/18/2016-severe obstructive airways disease FVC 2.62/51%, FEV1 1.30/31%, FEV1/FVC 0.50, FEF 25-75 0.61/14% Office Spirometry 01/25/17-severe obstructive airways disease. FVC 3.52/68%, FEV1 1.80/43%, ratio or 0.51, FEF 25-75% 0.76/18% IgE was too high for Xolair. He could not afford Nucala or allergy vaccine. Cinqair infusion@ [facility]- ordered 07/21/17 ----------------------------------------------------   08/10/21-  46 year old male former smoker (5.75 pk yrs) followed for severe asthma/ COPD/ Dupixent, chronic sinusitis, persistent eosinophilia, hyper IgE-too high for Xolair, history SVT/cardioversion/ ablation, Kidney stone, Covid infection Jan, 2022, HTN,  -Breztri, Dupixent, Singulair, Ventolin hfa, neb levalbuterol Covid vax-2 [facility] Flu vax-had ED today for severe asthma x 1 week, starting with dental extractions. Fever, hypoxemia .> doxycycline, prednisone I am concerned from description that he may have aspirated purulent material from bad tooth and developing aspiration pneumonia. Should respond to abx. Might need extended course. CXR 08/10/21-  IMPRESSION: 1. New bibasilar interstitial opacities, concerning for atypical infection.  12/10/21- 46 year old male former smoker (5.75 pk yrs) followed for severe asthma/ COPD/ Dupixent, chronic sinusitis, persistent eosinophilia, hyper IgE-too high for Xolair, history SVT/cardioversion/ ablation, Kidney stone, Covid infection Jan, 2022, HTN,  -Breztri, Dupixent, Singulair, Ventolin hfa, neb levalbuterol Covid vax-2 [facility] ED for asthma 5/14, 7/3> doxy/prednisone, 7/18> pred x 5 days. Had missed Dupixent due to cost. Unable to use nebulizer "makes him  cough", coughing till retches. CBC w diff 08/18/21- EOS H 17.6K/Ul He tells me there was some kind of problem with insurance coverage for Dupixent.  We will ask our pharmacy team to look into that.  He finds that Dupixent makes a tremendous difference for his asthma control and he had to go 6 weeks without it.  He has an injector for his next dose, due tomorrow, but none after that. Today he feels pretty well.  I discussed giving him a supply of Tessalon Perles, emphasizing not for asthma, but hoping it could calm cough enough that he could at least use his nebulizer machine. CXR 11/09/21- IMPRESSION: 1. No active cardiopulmonary disease. No evidence of pneumonia or pulmonary edema. 2. 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   ROS-see HPI  "+" = positive Constitutional:    weight loss, night sweats, fevers, chills, fatigue, lassitude. HEENT:    headaches, difficulty swallowing, tooth/dental problems, sore throat,       sneezing, itching, ear ache, +nasal congestion, +post nasal drip, snoring CV:    chest pain, orthopnea, PND, swelling in lower extremities, anasarca,                                                      dizziness, +palpitations Resp:   +shortness of breath with exertion or at rest.               + productive cough,  non-productive cough, coughing up of blood.              change in color of mucus.  +wheezing.   Skin:    rash or lesions. GI:  No-   heartburn, indigestion, abdominal pain, nausea, vomiting,  GU: d. MS:  joint pain, stiffness, decreased range of motion, back pain. Neuro-     nothing unusual Psych:  change in mood or affect.  depression or anxiety.   memory loss.  Objective:   Physical Exam General- Alert, Oriented, Affect-reserved/ laconic, Distress-none acute;  Tall, thin Skin- +tatoos Lymphadenopathy- none Head- atraumatic            Eyes- Gross vision intact, PERRLA, conjunctivae clear secretions             Ears- Hearing, canals normal            Nose- Clear, no-Septal dev, mucus, polyps, erosion, perforation             Throat- Mallampati II , mucosa clear , drainage- none, tonsils- atrophic Neck- flexible , trachea midline, no stridor , thyroid nl, carotid no bruit Chest - symmetrical excursion , unlabored           Heart/CV- RRR  , no murmur , no gallop  , no rub, nl s1 s2                           - JVD- none , edema- none, stasis changes- none, varices- none           Lung-   +Diminished/ clear, unlabored,  Cough-none, dullness-none, rub- none,  Wheeze-none Abd- scaphoid  Br/ Gen/ Rectal- Not done, not indicated Extrem- cyanosis- none, clubbing, none, atrophy- none, strength- nl Neuro- unremarkable

## 2021-12-10 ENCOUNTER — Encounter: Payer: Self-pay | Admitting: Internal Medicine

## 2021-12-10 ENCOUNTER — Ambulatory Visit (INDEPENDENT_AMBULATORY_CARE_PROVIDER_SITE_OTHER): Payer: BC Managed Care – PPO | Admitting: Internal Medicine

## 2021-12-10 DIAGNOSIS — J3089 Other allergic rhinitis: Secondary | ICD-10-CM

## 2021-12-10 DIAGNOSIS — J4551 Severe persistent asthma with (acute) exacerbation: Secondary | ICD-10-CM | POA: Diagnosis not present

## 2021-12-10 DIAGNOSIS — J302 Other seasonal allergic rhinitis: Secondary | ICD-10-CM | POA: Diagnosis not present

## 2021-12-10 MED ORDER — BREZTRI AEROSPHERE 160-9-4.8 MCG/ACT IN AERO: 2.0000 | INHALATION_SPRAY | Freq: Two times a day (BID) | RESPIRATORY_TRACT | 11 refills | Status: DC

## 2021-12-10 MED ORDER — ALBUTEROL SULFATE HFA 108 (90 BASE) MCG/ACT IN AERS: 2.0000 | INHALATION_SPRAY | RESPIRATORY_TRACT | 12 refills | Status: DC | PRN

## 2021-12-10 MED ORDER — BENZONATATE 200 MG PO CAPS: 200.0000 mg | ORAL_CAPSULE | Freq: Three times a day (TID) | ORAL | 1 refills | Status: DC | PRN

## 2021-12-10 NOTE — Assessment & Plan Note (Signed)
Dupixent has been dramatically helpful.  We need to clarify any supply/coverage problem. Plan-continue Dupixent.  Refill Breztri and albuterol.

## 2021-12-10 NOTE — Assessment & Plan Note (Signed)
Currently controlled.

## 2021-12-10 NOTE — Patient Instructions (Addendum)
I will ask our pharmacy team to look into why you may be having coverage or supply problems getting Dupixent.  Script sent for benzonatate perles. These won't help asthma, but might calm cough enough that you can at least use your nebulizer machine  Breztri and albuterol inhalers refilled

## 2021-12-17 ENCOUNTER — Other Ambulatory Visit (HOSPITAL_COMMUNITY): Payer: Self-pay

## 2021-12-21 ENCOUNTER — Other Ambulatory Visit (HOSPITAL_COMMUNITY): Payer: Self-pay

## 2021-12-23 ENCOUNTER — Other Ambulatory Visit (HOSPITAL_COMMUNITY): Payer: Self-pay

## 2021-12-24 ENCOUNTER — Other Ambulatory Visit: Payer: Self-pay | Admitting: Internal Medicine

## 2021-12-24 ENCOUNTER — Other Ambulatory Visit (HOSPITAL_COMMUNITY): Payer: Self-pay

## 2021-12-24 DIAGNOSIS — J4551 Severe persistent asthma with (acute) exacerbation: Secondary | ICD-10-CM

## 2021-12-24 MED ORDER — DUPIXENT 300 MG/2ML ~~LOC~~ SOAJ
SUBCUTANEOUS | 5 refills | Status: DC
  Filled 2021-12-24 – 2022-06-07 (×4): qty 4, 28d supply, fill #0
  Filled 2022-07-08 – 2022-07-14 (×2): qty 4, 28d supply, fill #1
  Filled 2022-08-16: qty 4, 28d supply, fill #2
  Filled 2022-09-07: qty 4, 28d supply, fill #3
  Filled 2022-10-01: qty 4, 28d supply, fill #4
  Filled 2022-11-09: qty 4, 28d supply, fill #5

## 2021-12-25 ENCOUNTER — Other Ambulatory Visit (HOSPITAL_COMMUNITY): Payer: Self-pay

## 2021-12-28 ENCOUNTER — Other Ambulatory Visit (HOSPITAL_COMMUNITY): Payer: Self-pay

## 2021-12-28 NOTE — Telephone Encounter (Signed)
Copay card once again declining for this month.Tiffany at [facility] reached out to Dupix[employer] and was told that pt only had $556 remaining for all of 2023. I suspect pt is in same situation as last month and simply needs to call in and express need for additional funds despite response given by [employer] rep.  Upon further inspection of billing history from last month, the "new copay card ID number" provided to Verlon Au appears to have been the same exact ID number the pt has always had ([id number]). However at some point in time the information appears to have been replaced with a different ID# (last 4 digits were [id number]) which is why the pt's new debit card info was originally declining until it was once again replaced with the original [id number] ID#.  Attempted to contact pt via listed mobile/home number, "busy" tone plays immediately after dialing. Per notes in therigy consent has been provided to speak with emergency contact Arketa, after dialing the phone rings twice before going to "busy" tone.  Will once again reach out to pt via MyChart to provide updates.

## 2021-12-29 ENCOUNTER — Other Ambulatory Visit (HOSPITAL_COMMUNITY): Payer: Self-pay

## 2022-01-01 NOTE — Telephone Encounter (Signed)
Encounter created in error, please disregard.  

## 2022-01-04 NOTE — Telephone Encounter (Signed)
Pt's gf picked up #2 boxes of samples on 8/25. [facility] has been notified that pt will be utilizing samples for the remainder of 2023. Pt will receive #2 additional boxes of samples on 11/3 when he comes back to clinic for OV with Dr. Maple Hudson.   Will push encounter out to ensure f/u.

## 2022-01-11 ENCOUNTER — Other Ambulatory Visit: Payer: Self-pay | Admitting: Cardiology

## 2022-01-11 ENCOUNTER — Other Ambulatory Visit: Payer: Self-pay | Admitting: Internal Medicine

## 2022-01-31 ENCOUNTER — Other Ambulatory Visit: Payer: Self-pay | Admitting: Internal Medicine

## 2022-02-12 ENCOUNTER — Other Ambulatory Visit: Payer: Self-pay | Admitting: Internal Medicine

## 2022-02-19 ENCOUNTER — Other Ambulatory Visit (HOSPITAL_COMMUNITY): Payer: Self-pay

## 2022-02-24 ENCOUNTER — Telehealth: Payer: Self-pay | Admitting: Neurology

## 2022-02-24 NOTE — Telephone Encounter (Signed)
Pt called in stating he is not able to take the Cogentin. It is causing sexual side effects. He would like to see if there is something different he should try?

## 2022-02-25 ENCOUNTER — Other Ambulatory Visit: Payer: Self-pay

## 2022-02-25 DIAGNOSIS — G250 Essential tremor: Secondary | ICD-10-CM

## 2022-02-25 MED ORDER — PRIMIDONE 50 MG PO TABS: ORAL_TABLET | ORAL | 1 refills | Status: DC

## 2022-02-25 NOTE — Telephone Encounter (Signed)
If he hasn't had kidney stones (I don't think he has), we can trial topamax.   Start topamax, 25 mg, 1 po q day x 1 week, then 2 po day x 1 week, then 3 po q day x 1 week and then start topamax, 100 mg daily.  May cause some numbness/tingling in feet/toes that is transient and usually goes away with time.  Drink plenty of water to avoid kidney stones.

## 2022-02-25 NOTE — Telephone Encounter (Signed)
Called pateint and he has had kidney stones in the past

## 2022-02-25 NOTE — Telephone Encounter (Signed)
That is likely why I haven't tried that then.  I didn't see it under his medical history, but maybe I missed it?  Unfortunately, besides for increasing primidone (we can do that), there is not much more to do short of surgeries (I know he doesn't want that). If he wants to increase primidone, he can take primidone, 50 mg, 4 in the AM, 2 at night.

## 2022-03-10 NOTE — Progress Notes (Signed)
Patient ID: Drew Sandoval, male    DOB: Nov 22, 1975    MRN: [id number]  HPI M  former smoker followed for severe asthma/ COPD, chronic sinusitis, hyper-eosinophilia and hyper IgE, complicated by history SVT/cardioversion FENO 03/18/16- 100  High, indicates allergic asthma Office Spirometry 03/18/2016-severe obstructive airways disease FVC 2.62/51%, FEV1 1.30/31%, FEV1/FVC 0.50, FEF 25-75 0.61/14% Office Spirometry 01/25/17-severe obstructive airways disease. FVC 3.52/68%, FEV1 1.80/43%, ratio or 0.51, FEF 25-75% 0.76/18% IgE was too high for Xolair. He could not afford Nucala or allergy vaccine. CBC w diff 08/18/21- EOS H 17.6K/Ul Cinqair infusion@ [facility]- ordered 07/21/17 ----------------------------------------------------   12/10/21- 46 year old male former smoker (5.75 pk yrs) followed for severe asthma/ COPD/ Dupixent, chronic sinusitis, persistent eosinophilia, hyper IgE-too high for Xolair, history SVT/cardioversion/ ablation, Kidney stone, Covid infection Jan, 2022, HTN,  -Breztri, Dupixent, Singulair, Ventolin hfa, neb levalbuterol Covid vax-2 [facility] ED for asthma 5/14, 7/3> doxy/prednisone, 7/18> pred x 5 days. Had missed Dupixent due to cost. Unable to use nebulizer "makes him cough", coughing till retches. CBC w diff 08/18/21- EOS H 17.6K/Ul He tells me there was some kind of problem with insurance coverage for Dupixent.  We will ask our pharmacy team to look into that.  He finds that Dupixent makes a tremendous difference for his asthma control and he had to go 6 weeks without it.  He has an injector for his next dose, due tomorrow, but none after that. Today he feels pretty well.  I discussed giving him a supply of Tessalon Perles, emphasizing not for asthma, but hoping it could calm cough enough that he could at least use his nebulizer machine. CXR 11/09/21- IMPRESSION: 1. No active cardiopulmonary disease. No evidence of pneumonia or pulmonary edema. 2. Chronic bronchitic changes  and probable mild chronic interstitial lung disease bilaterally.  03/12/22- 46 year old male former smoker (5.75 pk yrs) followed for severe asthma/ COPD/ Dupixent, chronic sinusitis, persistent eosinophilia, hyper IgE-too high for Xolair, history SVT/cardioversion/ ablation, Kidney stone, Covid infection Jan, 2022, HTN, Tremor,  -Breztri, Dupixent, Singulair, Ventolin hfa, neb levalbuterol Covid vax-2 [facility] Flu vax-today standard -----Pt is doing okay. No complaints No respiratory exacerbations since last year.  He is picking up more samples of Dupixent today as planned, to carry him until his insurance kicks in in January.  Dupixent clearly makes a significant improvement in his asthma control.  1 episode of tachypalpitations while lying in bed resolved spontaneously.  He is pending cardiology follow-up later this month.   ROS-see HPI  "+" = positive Constitutional:    weight loss, night sweats, fevers, chills, fatigue, lassitude. HEENT:    headaches, difficulty swallowing, tooth/dental problems, sore throat,       sneezing, itching, ear ache, +nasal congestion, +post nasal drip, snoring CV:    chest pain, orthopnea, PND, swelling in lower extremities, anasarca,            dizziness, +palpitations Resp:   +shortness of breath with exertion or at rest.               + productive cough,  non-productive cough, coughing up of blood.              change in color of mucus.  +wheezing.   Skin:    rash or lesions. GI:  No-   heartburn, indigestion, abdominal pain, nausea, vomiting,  GU: d. MS:   joint pain, stiffness, decreased range of motion, back pain. Neuro-     nothing unusual Psych:  change in mood or affect.  depression or anxiety.   memory loss.  Objective:   Physical Exam General- Alert, Oriented, Affect-reserved/ laconic, Distress-none acute;  Tall, thin Skin- +tatoos Lymphadenopathy- none Head- atraumatic            Eyes- Gross vision intact, PERRLA, conjunctivae clear  secretions            Ears- Hearing, canals normal            Nose- Clear, no-Septal dev, mucus, polyps, erosion, perforation             Throat- Mallampati II , mucosa clear , drainage- none, tonsils- atrophic Neck- flexible , trachea midline, no stridor , thyroid nl, carotid no bruit Chest - symmetrical excursion , unlabored           Heart/CV- RRR  , no murmur , no gallop  , no rub, nl s1 s2                           - JVD- none , edema- none, stasis changes- none, varices- none           Lung-   +Diminished/ clear, unlabored,  Cough-none, dullness-none, rub- none,  Wheeze-none Abd- scaphoid  Br/ Gen/ Rectal- Not done, not indicated Extrem- cyanosis- none, clubbing, none, atrophy- none, strength- nl Neuro- unremarkable

## 2022-03-12 ENCOUNTER — Ambulatory Visit (INDEPENDENT_AMBULATORY_CARE_PROVIDER_SITE_OTHER): Payer: BC Managed Care – PPO | Admitting: Internal Medicine

## 2022-03-12 ENCOUNTER — Telehealth: Payer: Self-pay | Admitting: Internal Medicine

## 2022-03-12 ENCOUNTER — Encounter: Payer: Self-pay | Admitting: Internal Medicine

## 2022-03-12 VITALS — BP 122/82 | HR 75 | Ht 75.0 in | Wt 190.0 lb

## 2022-03-12 DIAGNOSIS — I471 Supraventricular tachycardia, unspecified: Secondary | ICD-10-CM | POA: Diagnosis not present

## 2022-03-12 DIAGNOSIS — J4550 Severe persistent asthma, uncomplicated: Secondary | ICD-10-CM | POA: Diagnosis not present

## 2022-03-12 NOTE — Assessment & Plan Note (Signed)
Recent cold weather has not bothered him so far. Plan-pick up [place] samples today as planned until insurance takes over in January.

## 2022-03-12 NOTE — Patient Instructions (Signed)
Ok to pick up [place] today from [place] in our pharmacy. Hopefully this will last you until your insurance can take over.  Please let us know if there are problems.  Order- flu vax today- standard

## 2022-03-12 NOTE — Telephone Encounter (Signed)
Pharmacy team does not have enough samples right now to supply 2 months worth of medication. Left VM. Advised him to call our clinic when he has used last pen up and we can coordinate from there  Knox Saliva, PharmD, MPH, BCPS, CPP Clinical Pharmacist (Rheumatology and Pulmonology)

## 2022-03-12 NOTE — Assessment & Plan Note (Signed)
Reports 1 episode of tachypalpitations since here, spontaneously resolved. Plan-keep cardiology follow-up appointment November 28

## 2022-03-12 NOTE — Telephone Encounter (Addendum)
Patient provided with Dupixent sample x 2 pens. Unable to provide 2 boxes today because we are low on pen samples again.  [place]: [id number] Lot: [id number] Exp: 11/07/2023  Knox Saliva, PharmD, MPH, BCPS, CPP Clinical Pharmacist (Rheumatology and Pulmonology)

## 2022-03-12 NOTE — Telephone Encounter (Signed)
Patient called back- informed of note. Patient verbalized understanding and will call back after using last pen

## 2022-04-06 ENCOUNTER — Ambulatory Visit: Payer: BC Managed Care – PPO | Attending: Internal Medicine | Admitting: Internal Medicine

## 2022-04-06 ENCOUNTER — Encounter: Payer: Self-pay | Admitting: Internal Medicine

## 2022-04-06 VITALS — BP 168/114 | HR 66 | Ht 75.0 in | Wt 190.0 lb

## 2022-04-06 DIAGNOSIS — I10 Essential (primary) hypertension: Secondary | ICD-10-CM | POA: Diagnosis not present

## 2022-04-06 NOTE — Progress Notes (Signed)
HPI Mr. Cargile returns today for followup. 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 He developed HTN. He was placed on metoprolol. His bp is elevated today but he notes that it is usually well controlled. He notes that his asthma has been better.  He has a tremor and is considering having surgery. He had an episode of heart racing at about 120/min. This occurred when he lay down to sleep and resolved in a couple of minutes. He admits to drinking too much caffeine. Allergies  Allergen Reactions   Azithromycin Rash   Banana Swelling    Throat swelling    Alvesco [Ciclesonide] Rash    Possible reaction- rash   Anoro Ellipta [Umeclidinium-Vilanterol] Rash    Possible- rash   Methylprednisolone Other (See Comments)    unknown   Penicillins Rash    Has patient had a PCN reaction causing immediate rash, facial/tongue/throat swelling, SOB or lightheadedness with hypotension: Yes Has patient had a PCN reaction causing severe rash involving mucus membranes or skin necrosis: No Has patient had a PCN reaction that required hospitalization Yes Has patient had a PCN reaction occurring within the last 10 years: No If all of the above answers are "NO", then may proceed with Cephalosporin use.       Current Outpatient Medications  Medication Sig Dispense Refill   albuterol (VENTOLIN HFA) 108 (90 Base) MCG/ACT inhaler Inhale 2 puffs into the lungs every 4 (four) hours as needed. 18 g 12   amLODipine (NORVASC) 5 MG tablet TAKE 1 TABLET (5 MG TOTAL) BY MOUTH DAILY. 15 tablet 0   benzonatate (TESSALON) 200 MG capsule Take 1 capsule (200 mg total) by mouth 3 (three) times daily as needed for cough. 30 capsule 1   benztropine (COGENTIN) 1 MG tablet Take 1 tablet (1 mg total) by mouth daily. 90 tablet 1   Budeson-Glycopyrrol-Formoterol (BREZTRI AEROSPHERE) 160-9-4.8 MCG/ACT AERO Inhale 2 puffs into the lungs 2 (two) times daily. Rinse mouth  10.7 g 11   Dupilumab (DUPIXENT) 300 MG/2ML SOPN INJECT 300 MG INTO THE SKIN EVERY 14 DAYS. 4 mL 5   EPINEPHrine 0.3 mg/0.3 mL IJ SOAJ injection Inject into thigh for severe allergic reaction 1 Device prn   levalbuterol (XOPENEX) 0.31 MG/3ML nebulizer solution Take 3 mLs (0.31 mg total) by nebulization every 4 (four) hours as needed for wheezing. 75 mL 12   metoprolol succinate (TOPROL-XL) 25 MG 24 hr tablet TAKE 1 TABLET (25 MG TOTAL) BY MOUTH DAILY. 30 tablet 1   montelukast (SINGULAIR) 10 MG tablet TAKE 1 TABLET BY MOUTH EVERYDAY AT BEDTIME 90 tablet 3   primidone (MYSOLINE) 50 MG tablet TAKE 4 TABLETS BY MOUTH IN THE MORNING AND TAKE 2 TABLETS BY MOUTH AT BEDTIME 450 tablet 1   No current facility-administered medications for this visit.     Past Medical History:  Diagnosis Date   Acute conjunctivitis of right eye 10/07/2014   Acute respiratory failure with hypoxia (HCC) 07/23/2013   Asthma    Asthma    Phreesia 06/26/2020   Asthma, severe persistent 07/16/2010   PFT >> spiro 2012 with FEV1 1.14 (28%), ratio 45 Alpha 1 (08/12/10) >>MM ([facility])  IGE 1747, positive allergy profile >> Elevated eos on CBC S/p intubation 10/2010 Job exposure to respiratory irritant - bleach, chicken Allergy vaccine begun 11/09/2010. DC'd by pt 2013- resumed 2013> 1:50 GH; DC'd [facility] 9/24-9/27/  2013- Status asthma, no vent. [facility] 3/ 2015- status ast   COVID-19 virus infection 06/17/2020   January, 2022. No hosp, no MAB   Eczema, allergic 07/22/2011   Essential tremor 06/27/2020   Seasonal allergies    Seasonal and perennial allergic rhinitis 07/17/2014   FENO 03/18/16- 100  High, indicates allergic asthma   Sinusitis, maxillary, 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   Sinus CT 12/10/2013 >>>    Status asthmaticus 10/19/2010   Sustained SVT 09/24/2014   SVT (supraventricular tachycardia) 09/24/2014    ROS:   All systems reviewed and negative except as noted in the HPI.   Past Surgical History:   Procedure Laterality Date   None     SVT ABLATION N/A 09/07/2019   Procedure: SVT ABLATION;  Surgeon: Marinus Maw, MD;  Location[facility] CV LAB;  Service: Cardiovascular;  Laterality: N/A;     Family History  Problem Relation Age of Onset   Lung cancer Mother    Emphysema Father    Arthritis Sister    Diabetes Maternal Grandmother    Thyroid disease Neg Hx      Social History   Socioeconomic History   Marital status: Single    Spouse name: Not on file   Number of children: Not on file   Years of education: Not on file   Highest education level: Not on file  Occupational History   Occupation: sanitation    Employer: [employer]  Tobacco Use   Smoking status: Former    Packs/day: 0.50    Years: 11.50    Total pack years: 5.75    Types: Cigarettes    Quit date: 04/09/2010    Years since quitting: 12.0   Smokeless tobacco: Former   Tobacco comments:    started at age 22.  1/2 ppd.    Vaping Use   Vaping Use: Never used  Substance and Sexual Activity   Alcohol use: Yes    Comment: rarely   Drug use: No   Sexual activity: Yes    Birth control/protection: None  Other Topics Concern   Not on file  Social History Narrative   Left handed   Two story home   Drinks caffeine   Social Determinants of Health   Financial Resource Strain: Not on file  Food Insecurity: Not on file  Transportation Needs: Not on file  Physical Activity: Not on file  Stress: Not on file  Social Connections: Not on file  Intimate Partner Violence: Not on file     BP (!) 168/114 (BP Location: Right Arm, Patient Position: Sitting, Cuff Size: Normal)   Pulse 66   Ht 6\' 3"  (1.905 m)   Wt 190 lb (86.2 kg)   BMI 23.75 kg/m   Physical Exam:  Well appearing NAD HEENT: Unremarkable Neck:  No JVD, no thyromegally Lymphatics:  No adenopathy Back:  No CVA tenderness Lungs:  Clear with no wheezes HEART:  Regular rate rhythm, no murmurs, no rubs, no clicks Abd:  soft, positive  bowel sounds, no organomegally, no rebound, no guarding Ext:  2 plus pulses, no edema, no cyanosis, no clubbing Skin:  No rashes no nodules Neuro:  CN II through XII intact, motor grossly intact  EKG - nsr with no pre-excitation  Assess/Plan:  SVT - his symptoms appear to be controlled. I suspect that he had sinus tachy. If he has more symptoms we will ask him to wear a zio monitor. Sinus brady - I would like for him to stay  on the toprol and I asked her to check his HR with his bp machine at least weekly and to notify me if his HR is running low. HTN - his bp is elevated today but has been better. I ask him to continue the beta blocker and amlodipine. Asthma -he is not actively wheezing on metoprolol. He will continue.    Lewayne Bunting, MD

## 2022-04-06 NOTE — Patient Instructions (Signed)
Medication Instructions:  Your physician recommends that you continue on your current medications as directed. Please refer to the Current Medication list given to you today.  *If you need a refill on your cardiac medications before your next appointment, please call your pharmacy*   Lab Work: NONE   If you have labs (blood work) drawn today and your tests are completely normal, you will receive your results only by: MyChart Message (if you have MyChart) OR A paper copy in the mail If you have any lab test that is abnormal or we need to change your treatment, we will call you to review the results.   Testing/Procedures: NONE    Follow-Up: At [facility], you and your health needs are our priority.  As part of our continuing mission to provide you with exceptional heart care, we have created designated Provider Care Teams.  These Care Teams include your primary Cardiologist (physician) and Advanced Practice Providers (APPs -  Physician Assistants and Nurse Practitioners) who all work together to provide you with the care you need, when you need it.  We recommend signing up for the patient portal called "MyChart".  Sign up information is provided on this After Visit Summary.  MyChart is used to connect with patients for Virtual Visits (Telemedicine).  Patients are able to view lab/test results, encounter notes, upcoming appointments, etc.  Non-urgent messages can be sent to your provider as well.   To learn more about what you can do with MyChart, go to [email or web address].    Your next appointment:    As Needed   The format for your next appointment:   In Person  Provider:   Gregg Taylor, MD    Other Instructions Thank you for choosing [facility]!    Important Information About Sugar       

## 2022-04-07 NOTE — Telephone Encounter (Signed)
SW patient. He will stop by on 04/08/22 in the afternoon to pick up samples of Dupixent.

## 2022-04-09 NOTE — Telephone Encounter (Signed)
Patient stopped by clinic today and picked up 2 full boxes (4 total Dupixent doses) of Dupixent. This will tide him over through the end of 2023. When 2024 start, patient will be able to fill with [facility] and utilize copay card again   Drew Sandoval, PharmD, MPH, BCPS, CPP Clinical Pharmacist (Rheumatology and Pulmonology)

## 2022-04-23 NOTE — Progress Notes (Unsigned)
Assessment/Plan:    1.  Essential Tremor, exacerbated by medication for asthma  -Increase primidone, 4 tablets in the morning, 4 tablets at night .   -understands that tremor likely will never be fully under control due to other medications that induce tremor   -cardiology started him on low dose metoprolol for SVT.  It is probably too low dose to help tremor but given severity of asthma, wouldn't want to increase BB even if able.  -Discussed again with the patient that there really is not much more medical wise that we can do for his tremor.  His fiancee came for the first time today and we again discussed dbs vs focused ultrasound in detail.  He states today he is thinking about it.  The L hand is much worse than the right and he would just need unilateral R VIM surgery (he is L handed).     2.  Severe Asthma  -Medications for this contribute to tremor  3.  SVT  -Following with cardiology.  Subjective:   Drew Sandoval was seen today in follow up for essential tremor, which is much exacerbated by medication for his asthma. Pt with fiancee who supplements hx.    My previous records were reviewed prior to todays visit.  He remains frustrated with tremor. He has good and bad days.   We tried Cogentin last visit and he stopped it because of sexual side effects.  He asked what else there was to be done.  We just slightly increased the primidone, but also told him that he really did not have many other medical options, short of surgical options.  He thinks primidone helps and asks about increasing it further.   Current prescribed movement disorder medications: Primidone, 50 mg, 4 in the morning, 2 at night    PREVIOUS MEDICATIONS:  primidone ; artane (thought caused hand/feet itching); Topamax (not tried because of nephrolithiasis); Cogentin (discontinued because of sexual side effects; gabapentin (not helpful)  ALLERGIES:   Allergies  Allergen Reactions   Azithromycin Rash   Banana  Swelling    Throat swelling    Alvesco [Ciclesonide] Rash    Possible reaction- rash   Anoro Ellipta [Umeclidinium-Vilanterol] Rash    Possible- rash   Methylprednisolone Other (See Comments)    unknown   Penicillins Rash    Has patient had a PCN reaction causing immediate rash, facial/tongue/throat swelling, SOB or lightheadedness with hypotension: Yes Has patient had a PCN reaction causing severe rash involving mucus membranes or skin necrosis: No Has patient had a PCN reaction that required hospitalization Yes Has patient had a PCN reaction occurring within the last 10 years: No If all of the above answers are "NO", then may proceed with Cephalosporin use.      CURRENT MEDICATIONS:  Outpatient Encounter Medications as of 04/27/2022  Medication Sig   albuterol (VENTOLIN HFA) 108 (90 Base) MCG/ACT inhaler Inhale 2 puffs into the lungs every 4 (four) hours as needed.   amLODipine (NORVASC) 5 MG tablet TAKE 1 TABLET (5 MG TOTAL) BY MOUTH DAILY.   benztropine (COGENTIN) 1 MG tablet Take 1 tablet (1 mg total) by mouth daily.   Budeson-Glycopyrrol-Formoterol (BREZTRI AEROSPHERE) 160-9-4.8 MCG/ACT AERO Inhale 2 puffs into the lungs 2 (two) times daily. Rinse mouth   Dupilumab (DUPIXENT) 300 MG/2ML SOPN INJECT 300 MG INTO THE SKIN EVERY 14 DAYS.   EPINEPHrine 0.3 mg/0.3 mL IJ SOAJ injection Inject into thigh for severe allergic reaction   levalbuterol (XOPENEX) 0.31 MG/3ML  nebulizer solution Take 3 mLs (0.31 mg total) by nebulization every 4 (four) hours as needed for wheezing.   metoprolol succinate (TOPROL-XL) 25 MG 24 hr tablet TAKE 1 TABLET (25 MG TOTAL) BY MOUTH DAILY.   montelukast (SINGULAIR) 10 MG tablet TAKE 1 TABLET BY MOUTH EVERYDAY AT BEDTIME   primidone (MYSOLINE) 50 MG tablet TAKE 4 TABLETS BY MOUTH IN THE MORNING AND TAKE 2 TABLETS BY MOUTH AT BEDTIME   [DISCONTINUED] benzonatate (TESSALON) 200 MG capsule Take 1 capsule (200 mg total) by mouth 3 (three) times daily as needed  for cough. (Patient not taking: Reported on 04/27/2022)   No facility-administered encounter medications on file as of 04/27/2022.     Objective:    PHYSICAL EXAMINATION:    VITALS:   Vitals:   04/27/22 0802  BP: 126/88  Pulse: 72  SpO2: 95%  Weight: 193 lb (87.5 kg)  Height: 6\' 3"  (1.905 m)    GEN:  The patient appears stated age and is in NAD. HEENT:  Normocephalic, atraumatic.  The mucous membranes are moist.   Neurological examination:  Orientation: The patient is alert and oriented x3. Cranial nerves: There is good facial symmetry. The speech is fluent and clear. Soft palate rises symmetrically and there is no tongue deviation. Hearing is intact to conversational tone. Sensation: Sensation is intact to light touch throughout Motor: Strength is at least antigravity x4.  Movement examination: Tone: There is normal tone in the UE/LE Abnormal movements: L>>>R intention tremor.  This is stable c/t previous Coordination:  There is no decremation with RAM's Gait and Station: The patient has no difficulty arising out of a deep-seated chair without the use of the hands. The patient's stride length is good I have reviewed and interpreted the following labs independently   Chemistry      Component Value Date/Time   NA 140 08/18/2021 1617   K 3.8 08/18/2021 1617   CL 108 08/18/2021 1617   CO2 26 08/18/2021 1617   BUN 13 08/18/2021 1617   CREATININE 0.84 08/18/2021 1617      Component Value Date/Time   CALCIUM 8.8 (L) 08/18/2021 1617   ALKPHOS 62 06/13/2021 0021   AST 17 06/13/2021 0021   ALT 15 06/13/2021 0021   BILITOT 0.5 06/13/2021 0021      Lab Results  Component Value Date   WBC 26.7 (H) 08/18/2021   HGB 13.9 08/18/2021   HCT 41.4 08/18/2021   MCV 91.4 08/18/2021   PLT 326 08/18/2021   Lab Results  Component Value Date   TSH 0.47 10/08/2021     Chemistry      Component Value Date/Time   NA 140 08/18/2021 1617   K 3.8 08/18/2021 1617   CL 108  08/18/2021 1617   CO2 26 08/18/2021 1617   BUN 13 08/18/2021 1617   CREATININE 0.84 08/18/2021 1617      Component Value Date/Time   CALCIUM 8.8 (L) 08/18/2021 1617   ALKPHOS 62 06/13/2021 0021   AST 17 06/13/2021 0021   ALT 15 06/13/2021 0021   BILITOT 0.5 06/13/2021 0021      Total time spent on today's visit was 32 minutes, including both face-to-face time and nonface-to-face time.  Time included that spent on review of records (prior notes available to me/labs/imaging if pertinent), discussing treatment and goals, answering patient's questions and coordinating care.    Cc:  Pcp, No

## 2022-04-27 ENCOUNTER — Ambulatory Visit: Payer: BC Managed Care – PPO | Admitting: Neurology

## 2022-04-27 ENCOUNTER — Encounter: Payer: Self-pay | Admitting: Neurology

## 2022-04-27 DIAGNOSIS — G250 Essential tremor: Secondary | ICD-10-CM | POA: Diagnosis not present

## 2022-04-27 MED ORDER — PRIMIDONE 50 MG PO TABS: 200.0000 mg | ORAL_TABLET | Freq: Two times a day (BID) | ORAL | 1 refills | Status: DC

## 2022-04-27 NOTE — Patient Instructions (Signed)
Increase primidone 50 mg, 4 in the AM, 3 at night for a week and then take 4 in the AM and 4 at night thereafter

## 2022-05-06 ENCOUNTER — Other Ambulatory Visit (HOSPITAL_COMMUNITY): Payer: Self-pay

## 2022-05-11 ENCOUNTER — Other Ambulatory Visit (HOSPITAL_COMMUNITY): Payer: Self-pay

## 2022-05-13 ENCOUNTER — Other Ambulatory Visit (HOSPITAL_COMMUNITY): Payer: Self-pay

## 2022-05-31 ENCOUNTER — Ambulatory Visit
Admission: RE | Admit: 2022-05-31 | Discharge: 2022-05-31 | Disposition: A | Discharge status: Home / Self Care | Payer: BC Managed Care – PPO | Source: Ambulatory Visit | Attending: Family Medicine | Admitting: Family Medicine

## 2022-05-31 VITALS — BP 141/86 | HR 78 | Temp 98.3°F | Resp 18

## 2022-05-31 DIAGNOSIS — S46912A Strain of unspecified muscle, fascia and tendon at shoulder and upper arm level, left arm, initial encounter: Secondary | ICD-10-CM

## 2022-05-31 DIAGNOSIS — J4551 Severe persistent asthma with (acute) exacerbation: Secondary | ICD-10-CM

## 2022-05-31 DIAGNOSIS — R059 Cough, unspecified: Secondary | ICD-10-CM | POA: Insufficient documentation

## 2022-05-31 DIAGNOSIS — J069 Acute upper respiratory infection, unspecified: Secondary | ICD-10-CM | POA: Diagnosis not present

## 2022-05-31 DIAGNOSIS — Z1152 Encounter for screening for COVID-19: Secondary | ICD-10-CM | POA: Insufficient documentation

## 2022-05-31 MED ORDER — TIZANIDINE HCL 4 MG PO CAPS: 4.0000 mg | ORAL_CAPSULE | Freq: Three times a day (TID) | ORAL | 0 refills | Status: DC | PRN

## 2022-05-31 MED ORDER — PREDNISONE 20 MG PO TABS: 40.0000 mg | ORAL_TABLET | Freq: Every day | ORAL | 0 refills | Status: DC

## 2022-05-31 NOTE — ED Provider Notes (Signed)
[facility]    CSN: [id number] Arrival date & time: 05/31/22  0935      History   Chief Complaint Chief Complaint  Patient presents with   Shoulder Pain    I am pain in my neck and shoulder  the left side  and nasal pain - Entered by patient   Neck Pain   Cough    HPI Drew Sandoval is a 47 y.o. male.   Patient presenting today with 2-week history of left-sided neck and shoulder soreness, stiffness.  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  Denies radiation of pain down arms, numbness, tingling, weakness, decreased range of motion.  Trying Tylenol with minimal relief.  He also started yesterday with congestion, cough, shortness of breath, with and, chest tightness.  Denies fever, chills, body aches, chest pain, abdominal pain, nausea vomiting or diarrhea.  History of severe asthma followed by pulmonology, states his inhalers help for very short periods of time.  No known sick contacts recently.    Past Medical History:  Diagnosis Date   Acute conjunctivitis of right eye 10/07/2014   Acute respiratory failure with hypoxia ([place]) 07/23/2013   Asthma    Asthma    Phreesia 06/26/2020   Asthma, severe persistent 07/16/2010   PFT >> spiro 2012 with FEV1 1.14 (28%), ratio 45 Alpha 1 (08/12/10) >>MM ([facility])  IGE 1747, positive allergy profile >> Elevated eos on CBC S/p intubation 10/2010 Job exposure to respiratory irritant - bleach, chicken Allergy vaccine begun 11/09/2010. DC'd by pt 2013- resumed 2013> 1:50 GH; [facility] 9/24-9/27/ 2013- Status asthma, no vent. [facility] 3/ 2015- status ast   COVID-19 virus infection 06/17/2020   January, 2022. No hosp, no MAB   Eczema, allergic 07/22/2011   Essential tremor 06/27/2020   Seasonal allergies    Seasonal and perennial allergic rhinitis 07/17/2014   FENO 03/18/16- 100  High,  indicates allergic asthma   Sinusitis, maxillary, 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   Sinus CT 12/10/2013 >>>    Status asthmaticus 10/19/2010   Sustained SVT 09/24/2014   SVT (supraventricular tachycardia) 09/24/2014    Patient Active Problem List   Diagnosis Date Noted   Aspiration pneumonia ([place]) 08/26/2021   Hematuria 40/98/1191   Periumbilical abdominal pain 06/11/2021   Thyroid nodule 11/18/2020   Essential tremor 06/27/2020   Hypertension, essential 06/17/2020   Gynecomastia, male 04/24/2018   Sustained SVT ([place]) 09/24/2014   SVT (supraventricular tachycardia) ([place]) 09/24/2014   Malnutrition of moderate degree ([place]) 09/24/2014   Seasonal and perennial allergic rhinitis 07/17/2014   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 Authorizing Provider  albuterol (VENTOLIN HFA) 108 (90 Base) MCG/ACT inhaler Inhale 2 puffs into the lungs every 4 (four) hours as needed. 12/10/21  Yes Young, Tarri Fuller D, MD  amLODipine (NORVASC) 5 MG tablet TAKE 1 TABLET (  5 MG TOTAL) BY MOUTH DAILY. 02/12/22  Yes Marinus Maw, MD  Budeson-Glycopyrrol-Formoterol (BREZTRI AEROSPHERE) 160-9-4.8 MCG/ACT AERO Inhale 2 puffs into the lungs 2 (two) times daily. Rinse mouth 12/10/21  Yes Young, Clinton D, MD  Dupilumab (DUPIXENT) 300 MG/2ML SOPN INJECT 300 MG INTO THE SKIN EVERY 14 DAYS. 12/24/21  Yes Young, Joni Fears D, MD  levalbuterol (XOPENEX) 0.31 MG/3ML nebulizer solution Take 3 mLs (0.31 mg total) by nebulization every 4 (four) hours as needed for wheezing.  05/21/21  Yes Young, Joni Fears D, MD  metoprolol succinate (TOPROL-XL) 25 MG 24 hr tablet TAKE 1 TABLET (25 MG TOTAL) BY MOUTH DAILY. 01/11/22  Yes [place], Peter M, MD  montelukast (SINGULAIR) 10 MG tablet TAKE 1 TABLET BY MOUTH EVERYDAY AT BEDTIME 02/01/22  Yes Young, Joni Fears D, MD  predniSONE (DELTASONE) 20 MG tablet Take 2 tablets (40 mg total) by mouth daily with breakfast. 05/31/22  Yes Particia Nearing, PA-C  primidone (MYSOLINE) 50 MG tablet Take 4 tablets (200 mg total) by mouth 2 (two) times daily. 04/27/22  Yes Tat, Octaviano Batty, DO  tiZANidine (ZANAFLEX) 4 MG capsule Take 1 capsule (4 mg total) by mouth 3 (three) times daily as needed for muscle spasms. Do not drink alcohol or drive while taking this medication.  May cause drowsiness. 05/31/22  Yes Particia Nearing, PA-C  EPINEPHrine 0.3 mg/0.3 mL IJ SOAJ injection Inject into thigh for severe allergic reaction 04/29/15   [name], MD    Family History Family History  Problem Relation Age of Onset   Lung cancer Mother    Emphysema Father    Arthritis Sister    Diabetes Maternal Grandmother    Thyroid disease Neg Hx     Social History Social History   Tobacco Use   Smoking status: Former    Packs/day: 0.50    Years: 11.50    Total pack years: 5.75    Types: Cigarettes    Quit date: 04/09/2010    Years since quitting: 12.1   Smokeless tobacco: Former   Tobacco comments:    started at age 89.  1/2 ppd.    Vaping Use   Vaping Use: Never used  Substance Use Topics   Alcohol use: Yes    Comment: rarely   Drug use: No     Allergies   Azithromycin, Banana, [place] [ciclesonide], Anoro ellipta [umeclidinium-vilanterol], Methylprednisolone, and Penicillins   Review of Systems Review of Systems Per HPI  Physical Exam Triage Vital Signs ED Triage Vitals  Enc Vitals Group     BP 05/31/22 1041 (!) 141/86     Pulse Rate 05/31/22 1041 78     Resp 05/31/22 1041 18     Temp 05/31/22 1041 98.3 F (36.8 C)      Temp Source 05/31/22 1041 Oral     SpO2 05/31/22 1041 93 %     Weight --      Height --      Head Circumference --      Peak Flow --      Pain Score 05/31/22 1042 10     Pain Loc --      Pain Edu? --      Excl. in GC? --    No data found.  Updated Vital Signs BP (!) 141/86 (BP Location: Right Arm)   Pulse 78   Temp 98.3 F (36.8 C) (Oral)   Resp 18   SpO2 93%   Visual Acuity Right Eye Distance:  Left Eye Distance:   Bilateral Distance:    Right Eye Near:   Left Eye Near:    Bilateral Near:     Physical Exam Vitals and nursing note reviewed.  Constitutional:      Appearance: Normal appearance.  HENT:     Head: Atraumatic.     Nose: Rhinorrhea present.     Mouth/Throat:     Mouth: Mucous membranes are moist.     Pharynx: Oropharynx is clear. No posterior oropharyngeal erythema.  Eyes:     Extraocular Movements: Extraocular movements intact.     Conjunctiva/sclera: Conjunctivae normal.  Cardiovascular:     Rate and Rhythm: Normal rate and regular rhythm.  Pulmonary:     Effort: Pulmonary effort is normal.     Breath sounds: Wheezing present.  Musculoskeletal:        General: Normal range of motion.     Cervical back: Normal range of motion and neck supple.  Skin:    General: Skin is warm and dry.  Neurological:     General: No focal deficit present.     Mental Status: He is oriented to person, place, and time.  Psychiatric:        Mood and Affect: Mood normal.        Thought Content: Thought content normal.        Judgment: Judgment normal.      UC Treatments / Results  Labs (all labs ordered are listed, but only abnormal results are displayed) Labs Reviewed  SARS CORONAVIRUS 2 (TAT 6-24 HRS)    EKG   Radiology No results found.  Procedures Procedures (including critical care time)  Medications Ordered in UC Medications - No data to display  Initial Impression / Assessment and Plan / UC Course  I have reviewed the triage vital signs  and the nursing notes.  Pertinent labs & imaging results that were available during my care of the patient were reviewed by me and considered in my medical decision making (see chart for details).     COVID testing pending, suspect viral upper respiratory infection causing an asthma exacerbation.  Will treat this with prednisone, inhaler regimen, supportive over-the-counter medications and home care.  Suspect the prednisone will also help with his muscular strain in addition to Zanaflex.  Discussed supportive home care and return precautions.  Final Clinical Impressions(s) / UC Diagnoses   Final diagnoses:  Viral URI with cough  Severe persistent asthma with acute exacerbation  Strain of left shoulder, initial encounter   Discharge Instructions   None    ED Prescriptions     Medication Sig Dispense Auth. Provider   predniSONE (DELTASONE) 20 MG tablet Take 2 tablets (40 mg total) by mouth daily with breakfast. 10 tablet Volney American, PA-C   tiZANidine (ZANAFLEX) 4 MG capsule Take 1 capsule (4 mg total) by mouth 3 (three) times daily as needed for muscle spasms. Do not drink alcohol or drive while taking this medication.  May cause drowsiness. 15 capsule Volney American, [place]      PDMP not reviewed this encounter.   Volney American, [place] 05/31/22 1235

## 2022-05-31 NOTE — ED Triage Notes (Signed)
Patient states his right side on his neck and shoulder hurts for about 2 weeks now. Thinks he may have pulled a muscle at home. He is also having some nasal congestion with cough and SOB with activity that started last night. Taking tylenol, ibuprofen.

## 2022-06-01 LAB — SARS CORONAVIRUS 2 (TAT 6-24 HRS): SARS Coronavirus 2: NEGATIVE

## 2022-06-02 ENCOUNTER — Other Ambulatory Visit (HOSPITAL_COMMUNITY): Payer: Self-pay

## 2022-06-07 ENCOUNTER — Other Ambulatory Visit (HOSPITAL_COMMUNITY): Payer: Self-pay

## 2022-06-15 ENCOUNTER — Other Ambulatory Visit: Payer: Self-pay

## 2022-07-05 IMAGING — DX DG CHEST 2V
2 series · 2 of 2 positions shown · non-contrast
Comparison: Chest x-ray dated June 17, 2020.

CLINICAL DATA: Fever and shortness of breath since tooth extraction
a week ago.

EXAM:
CHEST - 2 VIEW

[chest pa]
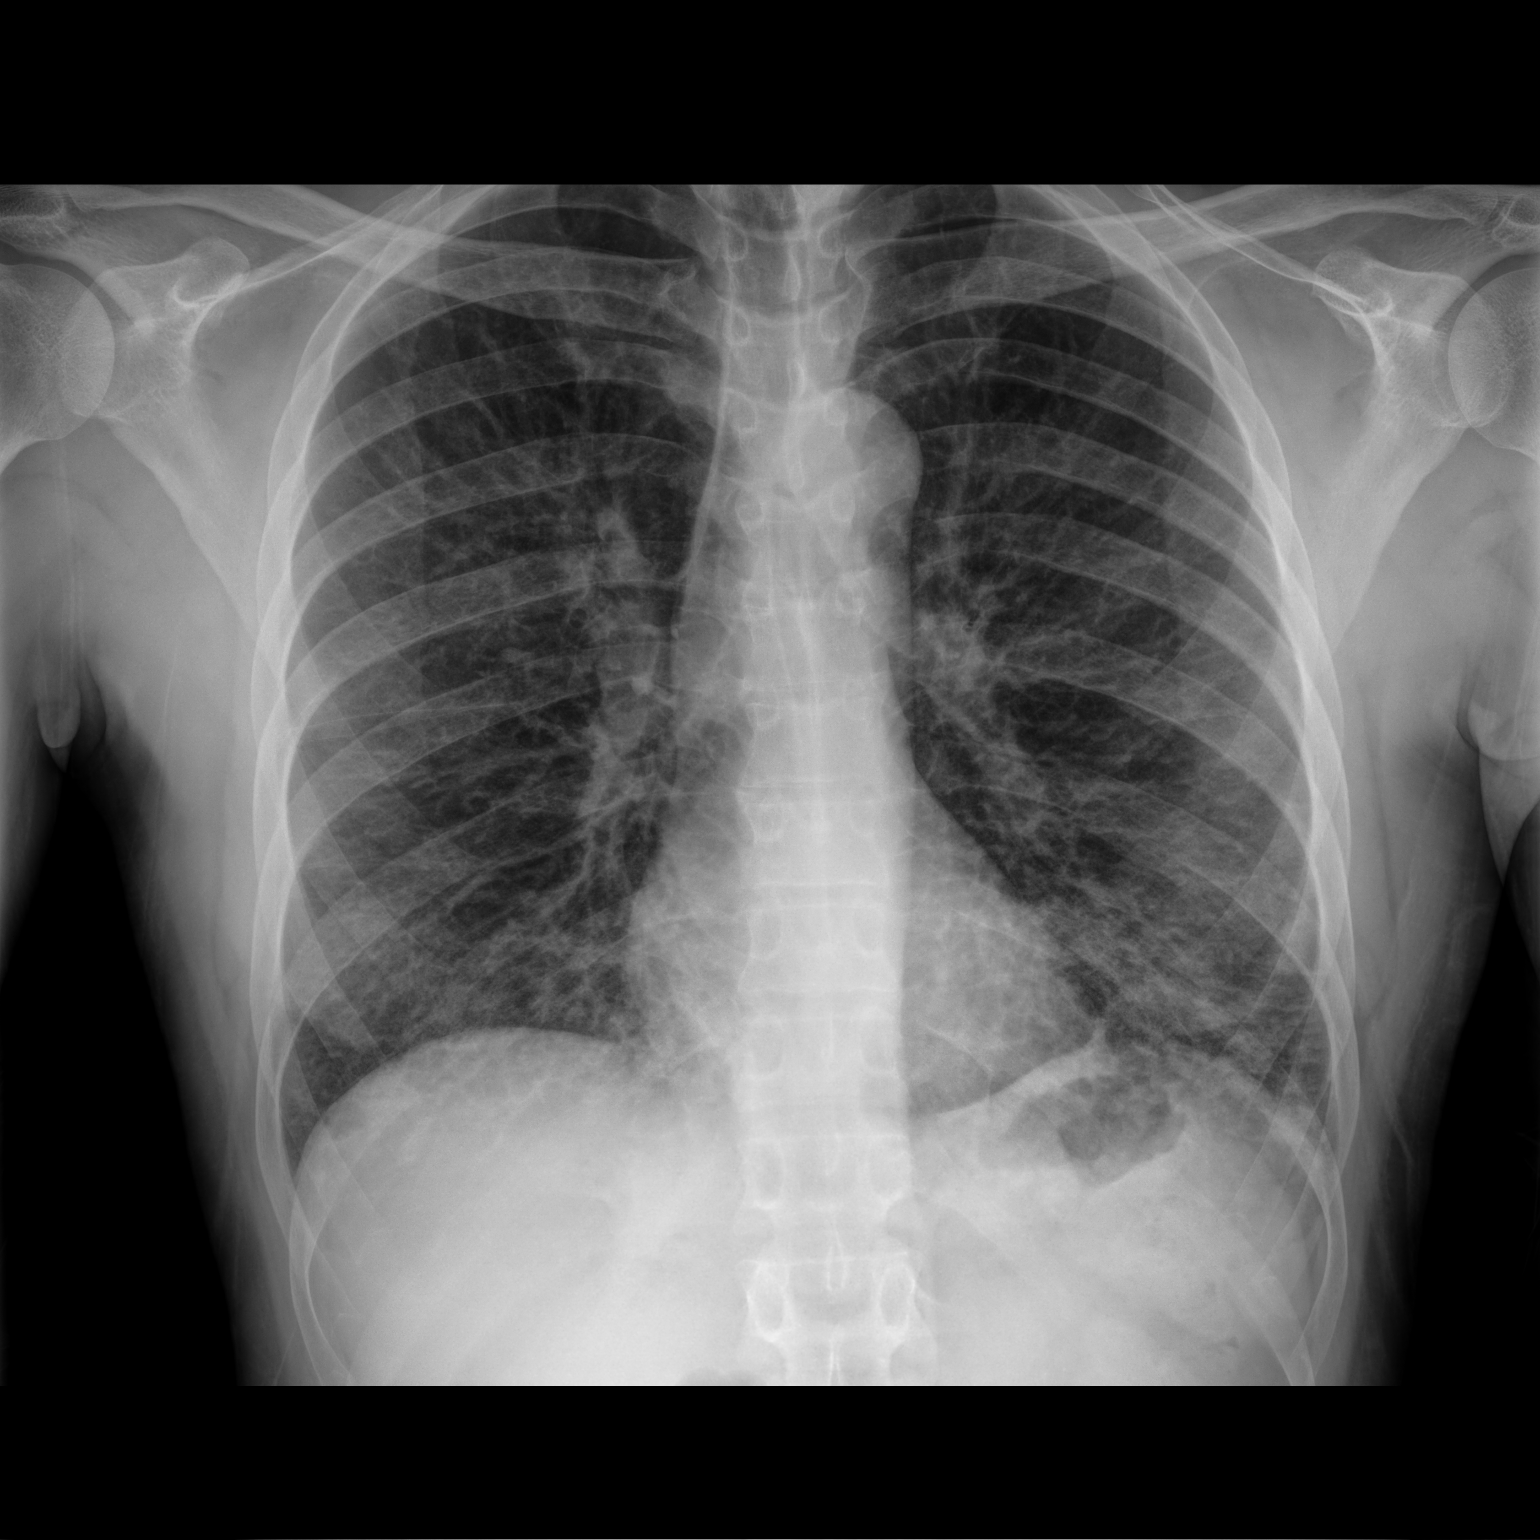

[chest lat]
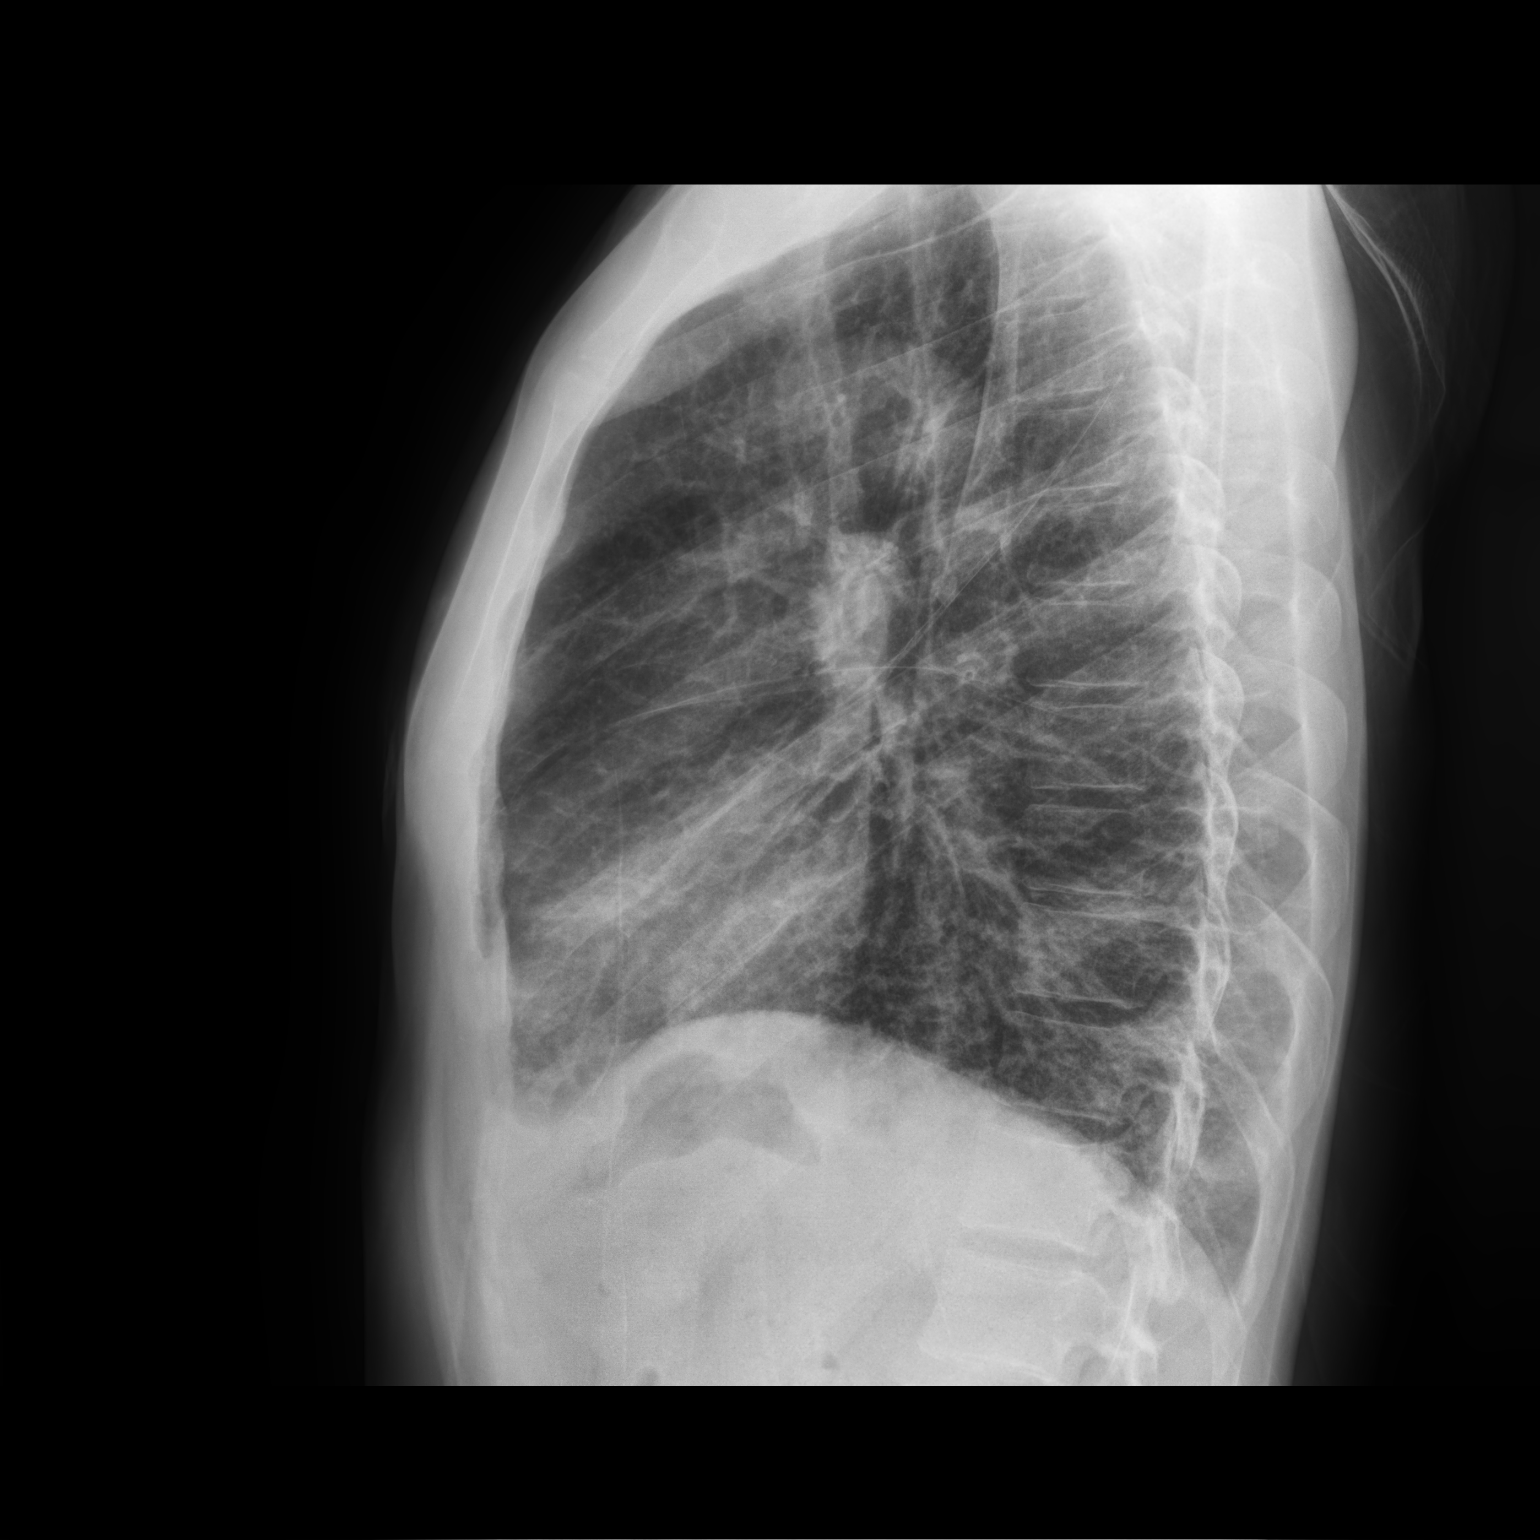

[2 of 2 positions shown; findings below may reference images not displayed]

FINDINGS: The heart size and mediastinal contours are within normal limits.
Normal pulmonary vascularity. New patchy interstitial opacities at
both lung bases. No pleural effusion or pneumothorax. No acute
osseous abnormality.
IMPRESSION: 1. New bibasilar interstitial opacities, concerning for atypical
infection.

## 2022-07-06 ENCOUNTER — Other Ambulatory Visit (HOSPITAL_COMMUNITY): Payer: Self-pay

## 2022-07-08 ENCOUNTER — Other Ambulatory Visit (HOSPITAL_COMMUNITY): Payer: Self-pay

## 2022-07-09 NOTE — Progress Notes (Signed)
Patient ID: Drew Sandoval, male    DOB: 28-Mar-1976    MRN: [id number]  HPI M  former smoker followed for severe asthma/ COPD, chronic sinusitis, hyper-eosinophilia and hyper IgE, complicated by history SVT/cardioversion FENO 03/18/16- 100  High, indicates allergic asthma Office Spirometry 03/18/2016-severe obstructive airways disease FVC 2.62/51%, FEV1 1.30/31%, FEV1/FVC 0.50, FEF 25-75 0.61/14% Office Spirometry 01/25/17-severe obstructive airways disease. FVC 3.52/68%, FEV1 1.80/43%, ratio or 0.51, FEF 25-75% 0.76/18% IgE was too high for Xolair. He could not afford Nucala or allergy vaccine. CBC w diff 08/18/21- EOS H 17.6K/Ul Cinqair infusion@ [facility]- ordered 07/21/17 ----------------------------------------------------   03/12/22- 47 year old male former smoker (5.75 pk yrs) followed for severe asthma/ COPD/ Dupixent, chronic sinusitis, persistent eosinophilia, hyper IgE-too high for Xolair, history SVT/cardioversion/ ablation, Kidney stone, Covid infection Jan, 2022, HTN, Tremor,  -Breztri, Dupixent, Singulair, Ventolin hfa, neb levalbuterol Covid vax-2 Phizer Flu vax-today standard -----Pt is doing okay. No complaints No respiratory exacerbations since last year.  He is picking up more samples of Dupixent today as planned, to carry him until his insurance kicks in in January.  Dupixent clearly makes a significant improvement in his asthma control.  1 episode of tachypalpitations while lying in bed resolved spontaneously.  He is pending cardiology follow-up later this month.  07/12/22- 47 year old male former smoker (5.75 pk yrs) followed for severe asthma/ COPD/ Dupixent, chronic sinusitis, persistent eosinophilia, hyper IgE-too high for Xolair, history SVT/cardioversion/ ablation, Kidney stone, Covid infection Jan, 2022, HTN, Tremor,  -Breztri, Dupixent, Singulair, Ventolin hfa, neb levalbuterol Covid vax-2 Phizer Flu vax-had ED 05/31/22- Viral URI, Asthma exacerbation, strain of L  shoulder> prednisone -----Asthma flares, Head colds He feels he has had a recent head cold and now has sinus infection.  Chest feels comfortable.  He tends to put up with a certain amount of chest tightness and cough at baseline.  Continues Dupixent and insurance issues were cleared so he is able to get this regularly.  No recent SVT.  Continues to follow cardiology.    ROS-see HPI  "+" = positive Constitutional:    weight loss, night sweats, fevers, chills, fatigue, lassitude. HEENT:    headaches, difficulty swallowing, tooth/dental problems, sore throat,       sneezing, itching, ear ache, +nasal congestion, +post nasal drip, snoring CV:    chest pain, orthopnea, PND, swelling in lower extremities, anasarca,            dizziness, +palpitations Resp:   +shortness of breath with exertion or at rest.               + productive cough,  non-productive cough, coughing up of blood.              change in color of mucus.  +wheezing.   Skin:    rash or lesions. GI:  No-   heartburn, indigestion, abdominal pain, nausea, vomiting,  GU: d. MS:   joint pain, stiffness, decreased range of motion, back pain. Neuro-     nothing unusual Psych:  change in mood or affect.  depression or anxiety.   memory loss.  Objective:   Physical Exam General- Alert, Oriented, Affect-reserved/ laconic, Distress-none acute;  Tall, thin Skin- +tatoos Lymphadenopathy- none Head- atraumatic            Eyes- Gross vision intact, PERRLA, conjunctivae clear secretions            Ears- Hearing, canals normal            Nose- +stuffy, no-Septal dev, mucus,  polyps, erosion, perforation             Throat- Mallampati II , mucosa clear , drainage- none, tonsils- atrophic Neck- flexible , trachea midline, no stridor , thyroid nl, carotid no bruit Chest - symmetrical excursion , unlabored           Heart/CV- RRR  , no murmur , no gallop  , no rub, nl s1 s2                           - JVD- none , edema- none, stasis changes-  none, varices- none           Lung-   +Diminished/ clear, unlabored,  Cough-none, dullness-none, rub- none,  Wheeze-none Abd- scaphoid  Br/ Gen/ Rectal- Not done, not indicated Extrem- cyanosis- none, clubbing, none, atrophy- none, strength- nl Neuro- unremarkable

## 2022-07-12 ENCOUNTER — Encounter: Payer: Self-pay | Admitting: Internal Medicine

## 2022-07-12 ENCOUNTER — Ambulatory Visit (INDEPENDENT_AMBULATORY_CARE_PROVIDER_SITE_OTHER): Payer: BC Managed Care – PPO | Admitting: Internal Medicine

## 2022-07-12 VITALS — BP 130/78 | HR 70 | Temp 98.3°F | Ht 75.0 in | Wt 192.4 lb

## 2022-07-12 DIAGNOSIS — J320 Chronic maxillary sinusitis: Secondary | ICD-10-CM

## 2022-07-12 DIAGNOSIS — J4550 Severe persistent asthma, uncomplicated: Secondary | ICD-10-CM

## 2022-07-12 MED ORDER — DOXYCYCLINE HYCLATE 100 MG PO TABS: ORAL_TABLET | ORAL | 0 refills | Status: DC

## 2022-07-12 NOTE — Patient Instructions (Signed)
Script sent for doxycycline 10 day course to treat sinus infection  Please let me know if you don't get better

## 2022-07-13 ENCOUNTER — Other Ambulatory Visit (HOSPITAL_COMMUNITY): Payer: Self-pay

## 2022-07-14 ENCOUNTER — Telehealth: Payer: Self-pay

## 2022-07-14 ENCOUNTER — Other Ambulatory Visit (HOSPITAL_COMMUNITY): Payer: Self-pay

## 2022-07-14 ENCOUNTER — Other Ambulatory Visit: Payer: Self-pay

## 2022-07-14 NOTE — Telephone Encounter (Signed)
Received notification from [facility] that pt needs a new PA.  Submitted an URGENT Prior Authorization request to [facility]  for [place] via CoverMyMeds. Will update once we receive a response.  Key: [id number]

## 2022-07-14 NOTE — Telephone Encounter (Signed)
Received notification from [facility] regarding a prior authorization for [place]. Authorization has been APPROVED from 06/14/2022 to 07/14/2023. Approval letter sent to scan center. Updated [employer] with current Prior Authorization information.  Authorization # [id number]

## 2022-07-28 NOTE — Assessment & Plan Note (Signed)
Treating as exacerbation of bilateral maxillary sinusitis Plan-doxycycline 10 days, saline rinse

## 2022-07-28 NOTE — Assessment & Plan Note (Signed)
Currently controlled at baseline.  Has not had exacerbation yet with recent cold but recognizes potential and is encouraged to be diligent with medications, avoid running out.

## 2022-07-29 ENCOUNTER — Other Ambulatory Visit (HOSPITAL_COMMUNITY): Payer: Self-pay

## 2022-08-02 ENCOUNTER — Other Ambulatory Visit (HOSPITAL_COMMUNITY): Payer: Self-pay

## 2022-08-04 ENCOUNTER — Other Ambulatory Visit (HOSPITAL_COMMUNITY): Payer: Self-pay

## 2022-08-16 ENCOUNTER — Other Ambulatory Visit (HOSPITAL_COMMUNITY): Payer: Self-pay

## 2022-08-16 ENCOUNTER — Other Ambulatory Visit: Payer: Self-pay

## 2022-09-07 ENCOUNTER — Other Ambulatory Visit (HOSPITAL_COMMUNITY): Payer: Self-pay

## 2022-09-10 ENCOUNTER — Other Ambulatory Visit (HOSPITAL_COMMUNITY): Payer: Self-pay

## 2022-09-19 ENCOUNTER — Ambulatory Visit
Admission: RE | Admit: 2022-09-19 | Discharge: 2022-09-19 | Disposition: A | Discharge status: Home / Self Care | Payer: BC Managed Care – PPO | Source: Ambulatory Visit | Attending: Nurse Practitioner | Admitting: Nurse Practitioner

## 2022-09-19 VITALS — BP 154/89 | HR 72 | Temp 98.5°F | Resp 18

## 2022-09-19 DIAGNOSIS — J45901 Unspecified asthma with (acute) exacerbation: Secondary | ICD-10-CM

## 2022-09-19 HISTORY — DX: Essential (primary) hypertension: I10

## 2022-09-19 MED ORDER — PREDNISONE 50 MG PO TABS: ORAL_TABLET | ORAL | 0 refills | Status: DC

## 2022-09-19 MED ORDER — ALBUTEROL SULFATE (2.5 MG/3ML) 0.083% IN NEBU
2.5000 mg | INHALATION_SOLUTION | Freq: Once | RESPIRATORY_TRACT | Status: AC
Start: 2022-09-19 — End: 2022-09-19
  Administered 2022-09-19: 2.5 mg via RESPIRATORY_TRACT

## 2022-09-19 MED ORDER — MONTELUKAST SODIUM 10 MG PO TABS: 10.0000 mg | ORAL_TABLET | Freq: Every day | ORAL | 0 refills | Status: DC

## 2022-09-19 MED ORDER — AZELASTINE HCL 0.1 % NA SOLN: 2.0000 | Freq: Two times a day (BID) | NASAL | 0 refills | Status: DC

## 2022-09-19 NOTE — ED Triage Notes (Signed)
Sneezing,runny nose, asthma flare up x 1 week. Dry cough.   States has been having to use his inhaler 3 to 4 times a day.

## 2022-09-19 NOTE — ED Provider Notes (Signed)
[facility]    CSN: [id number] Arrival date & time: 09/19/22  0845      History   Chief Complaint Chief Complaint  Patient presents with   Allergic Reaction    It's missing with my asthma short of breath chest tight - Entered by patient    HPI Drew Sandoval is a 47 y.o. male.   The history is provided by the patient.   The patient presents for complaints of shortness of breath, nasal congestion, and runny nose.  Patient states symptoms have been present over the past week since he mowed his yard.  Patient also complains of wheezing and chest tightness.  Patient states that he does have a history of asthma and seasonal allergies.  He reports that he feels his allergies have triggered his asthma.  Patient uses Breztri and Albuterol.  Reports he has been using his albuterol inhaler 3-4 times a day.  Patient also reports a dry cough.  Patient denies fever, chills, headache, sore throat, sinus pain/pressure, difficulty breathing, chest pain, abdominal pain, nausea, vomiting, or diarrhea.  Patient reports that he has not been taking his Zyrtec as it makes him drowsy.  He does not use any other medications for his allergies.  Past Medical History:  Diagnosis Date   Acute conjunctivitis of right eye 10/07/2014   Acute respiratory failure with hypoxia (HCC) 07/23/2013   Asthma    Asthma    Phreesia 06/26/2020   Asthma, severe persistent 07/16/2010   PFT >> spiro 2012 with FEV1 1.14 (28%), ratio 45 Alpha 1 (08/12/10) >>MM ([facility])  IGE 1747, positive allergy profile >> Elevated eos on CBC S/p intubation 10/2010 Job exposure to respiratory irritant - bleach, chicken Allergy vaccine begun 11/09/2010. DC'd by pt 2013- resumed 2013> 1:50 GH; DC'd -[facility] 9/24-9/27/ 2013- Status asthma, no vent. [facility] 3/ 2015- status ast   COVID-19 virus infection 06/17/2020   January, 2022. No hosp, no MAB   Eczema, allergic 07/22/2011   Essential tremor 06/27/2020    Hypertension    Seasonal allergies    Seasonal and perennial allergic rhinitis 07/17/2014   FENO 03/18/16- 100  High, indicates allergic asthma   Sinusitis, maxillary, 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   Sinus CT 12/10/2013 >>>    Status asthmaticus 10/19/2010   Sustained SVT 09/24/2014   SVT (supraventricular tachycardia) 09/24/2014    Patient Active Problem List   Diagnosis Date Noted   Aspiration pneumonia (HCC) 08/26/2021   Hematuria 06/11/2021   Periumbilical abdominal pain 06/11/2021   Thyroid nodule 11/18/2020   Essential tremor 06/27/2020   Hypertension, essential 06/17/2020   Gynecomastia, male 04/24/2018   Sustained SVT (HCC) 09/24/2014   SVT (supraventricular tachycardia) (HCC) 09/24/2014   Malnutrition of moderate degree (HCC) 09/24/2014   Seasonal and perennial allergic rhinitis 07/17/2014   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 Authorizing Provider  azelastine (ASTELIN) 0.1 % nasal spray Place 2 sprays into both nostrils 2 (two) times daily. Use in each nostril  as directed 09/19/22  Yes Axell Trigueros-Warren, Sadie Haber, NP  montelukast (SINGULAIR) 10 MG tablet Take 1 tablet (10 mg total) by mouth at bedtime. 09/19/22  Yes Aldair Rickel-Warren, Sadie Haber, NP  predniSONE (DELTASONE) 50 MG tablet Take 1 tablet daily with breakfast for the next 5 days 09/19/22  Yes Andreina Outten-Warren, Sadie Haber, NP  albuterol (VENTOLIN HFA) 108 (90 Base) MCG/ACT inhaler Inhale 2 puffs into the lungs every 4 (four) hours as needed. 12/10/21   Young, Joni Fears  D, MD  amLODipine (NORVASC) 5 MG tablet TAKE 1 TABLET (5 MG TOTAL) BY MOUTH DAILY. 02/12/22   Marinus Maw, MD  Budeson-Glycopyrrol-Formoterol (BREZTRI AEROSPHERE) 160-9-4.8 MCG/ACT AERO Inhale 2 puffs into the lungs 2 (two) times daily. Rinse mouth 12/10/21   Young, Joni Fears D, MD  Dupilumab (DUPIXENT) 300 MG/2ML SOPN INJECT 300 MG INTO THE SKIN EVERY 14 DAYS. 12/24/21   Jetty Duhamel D, MD  EPINEPHrine 0.3 mg/0.3 mL IJ SOAJ injection Inject into thigh for severe allergic reaction 04/29/15   Jetty Duhamel D, MD  levalbuterol (XOPENEX) 0.31 MG/3ML nebulizer solution Take 3 mLs (0.31 mg total) by nebulization every 4 (four) hours as needed for wheezing. 05/21/21   [name], MD  primidone (MYSOLINE) 50 MG tablet Take 4 tablets (200 mg total) by mouth 2 (two) times daily. 04/27/22   Tat, Octaviano Batty, DO  tiZANidine (ZANAFLEX) 4 MG capsule Take 1 capsule (4 mg total) by mouth 3 (three) times daily as needed for muscle spasms. Do not drink alcohol or drive while taking this medication.  May cause drowsiness. Patient not taking: Reported on 07/12/2022 05/31/22   Particia Nearing, PA-C    Family History Family History  Problem Relation Age of Onset   Lung cancer Mother    Emphysema Father    Arthritis Sister    Diabetes Maternal Grandmother    Thyroid disease Neg Hx     Social History Social History   Tobacco Use   Smoking status: Former    Packs/day: 0.50    Years: 11.50    Additional pack years: 0.00    Total pack years: 5.75    Types: Cigarettes    Quit date: 04/09/2010    Years since quitting: 12.4   Smokeless tobacco: Former   Tobacco comments:    started at age 22.  1/2 ppd.    Vaping Use   Vaping Use: Never used  Substance Use Topics   Alcohol use: Yes    Comment: rarely   Drug use: No     Allergies   Azithromycin, Banana, Alvesco [ciclesonide], Anoro ellipta [umeclidinium-vilanterol], Methylprednisolone, and Penicillins   Review of Systems Review of  Systems Per HPI  Physical Exam Triage Vital Signs ED Triage Vitals  Enc Vitals Group     BP 09/19/22 0919 (!) 154/89     Pulse Rate 09/19/22 0919 72     Resp 09/19/22 0919 18     Temp 09/19/22 0919 98.5 F (36.9 C)     Temp Source 09/19/22 0919 Oral     SpO2 09/19/22 0919 92 %     Weight --      Height --      Head Circumference --      Peak Flow --      Pain Score 09/19/22 0920 7     Pain Loc --      Pain Edu? --      Excl. in GC? --    No data found.  Updated Vital Signs  BP (!) 154/89 (BP Location: Right Arm)   Pulse 72   Temp 98.5 F (36.9 C) (Oral)   Resp 18   SpO2 92%   Visual Acuity Right Eye Distance:   Left Eye Distance:   Bilateral Distance:    Right Eye Near:   Left Eye Near:    Bilateral Near:     Physical Exam Vitals and nursing note reviewed.  Constitutional:      General: He is not in acute distress.    Appearance: Normal appearance.     Comments: Patient is speaking in complete sentences, he is in no acute distress.  HENT:     Head: Normocephalic.     Right Ear: Tympanic membrane, ear canal and external ear normal.     Left Ear: Tympanic membrane, ear canal and external ear normal.     Nose: Congestion and rhinorrhea present.     Mouth/Throat:     Mouth: Mucous membranes are moist.     Pharynx: Posterior oropharyngeal erythema present.     Comments: Cobblestoning present to posterior oropharynx Eyes:     Extraocular Movements: Extraocular movements intact.     Conjunctiva/sclera: Conjunctivae normal.     Pupils: Pupils are equal, round, and reactive to light.  Cardiovascular:     Rate and Rhythm: Normal rate and regular rhythm.     Pulses: Normal pulses.     Heart sounds: Normal heart sounds.  Pulmonary:     Effort: Pulmonary effort is normal. No respiratory distress.     Breath sounds: Normal breath sounds. No stridor. No wheezing, rhonchi or rales.     Comments: Patient reports decreased chest tightness after albuterol nebulizer  treatment.  Lung sounds remain clear throughout. Abdominal:     General: Bowel sounds are normal.     Palpations: Abdomen is soft.     Tenderness: There is no abdominal tenderness.  Musculoskeletal:     Cervical back: Normal range of motion.  Lymphadenopathy:     Cervical: No cervical adenopathy.  Skin:    General: Skin is warm and dry.  Neurological:     General: No focal deficit present.     Mental Status: He is alert and oriented to person, place, and time.  Psychiatric:        Mood and Affect: Mood normal.        Behavior: Behavior normal.      UC Treatments / Results  Labs (all labs ordered are listed, but only abnormal results are displayed) Labs Reviewed - No data to display  EKG   Radiology No results found.  Procedures Procedures (including critical care time)  Medications Ordered in UC Medications  albuterol (PROVENTIL) (2.5 MG/3ML) 0.083% nebulizer solution 2.5 mg (2.5 mg Nebulization Given 09/19/22 0952)    Initial Impression / Assessment and Plan / UC Course  I have reviewed the triage vital signs and the nursing notes.  Pertinent labs & imaging results that were available during my care of the patient were reviewed by me and considered in my medical decision making (see chart for details).  The patient is well-appearing, he is in acute distress, vital signs are stable.  No wheezing noted on initial exam, patient was given albuterol nebulizer treatment due to complaints of chest tightness.  Lung sounds remain clear throughout.  Patient also reports decreased chest tightness.  Patient was prescribed prednisone 50 mg for possible asthma exacerbation along with montelukast 10 mg for control of his asthma and allergies, and still on  0.1% nasal spray to help with his allergies.  Supportive care recommendations were provided and discussed with the patient along with indications for follow-up may be necessary.  Patient was advised to follow-up with his  pulmonologist within the next 7 to 10 days for reevaluation.  Patient was in agreement with this plan of care and verbalizes understanding.  All questions were answered.  Patient stable for discharge.   Final Clinical Impressions(s) / UC Diagnoses   Final diagnoses:  Asthma with acute exacerbation, unspecified asthma severity, unspecified whether persistent     Discharge Instructions      Take medication as prescribed.  Continue use of your regular allergy medications. Increase fluids and allow for plenty of rest. May take over-the-counter Tylenol or ibuprofen as needed for pain, fever, or general discomfort. As discussed, it is recommended that you begin taking your allergy medication at bedtime. You may need to wear a mask when mowing the yard in the future to avoid exacerbation of your allergy symptoms. Go to the emergency department immediately if you develop worsening shortness of breath, difficulty breathing, worsening wheezing, become unable to speak in a complete sentence, or other concerns. Please follow-up with your pulmonologist within the next 7 to 10 days for reevaluation. Follow-up as needed.     ED Prescriptions     Medication Sig Dispense Auth. Provider   predniSONE (DELTASONE) 50 MG tablet Take 1 tablet daily with breakfast for the next 5 days 5 tablet Aceson Labell-Warren, Sadie Haber, NP   azelastine (ASTELIN) 0.1 % nasal spray Place 2 sprays into both nostrils 2 (two) times daily. Use in each nostril as directed 30 mL Larsen Zettel-Warren, Sadie Haber, NP   montelukast (SINGULAIR) 10 MG tablet Take 1 tablet (10 mg total) by mouth at bedtime. 30 tablet Jed Kutch-Warren, Sadie Haber, NP      PDMP not reviewed this encounter.   Abran Cantor, NP 09/19/22 1012

## 2022-09-19 NOTE — Discharge Instructions (Addendum)
Take medication as prescribed.  Continue use of your regular allergy medications. Increase fluids and allow for plenty of rest. May take over-the-counter Tylenol or ibuprofen as needed for pain, fever, or general discomfort. As discussed, it is recommended that you begin taking your allergy medication at bedtime. You may need to wear a mask when mowing the yard in the future to avoid exacerbation of your allergy symptoms. Go to the emergency department immediately if you develop worsening shortness of breath, difficulty breathing, worsening wheezing, become unable to speak in a complete sentence, or other concerns. Please follow-up with your pulmonologist within the next 7 to 10 days for reevaluation. Follow-up as needed.

## 2022-09-23 ENCOUNTER — Emergency Department (HOSPITAL_COMMUNITY)
Admission: EM | Admit: 2022-09-23 | Discharge: 2022-09-24 | Disposition: A | Discharge status: Home / Self Care | Payer: BC Managed Care – PPO | Attending: Emergency Medicine | Admitting: Emergency Medicine

## 2022-09-23 ENCOUNTER — Other Ambulatory Visit: Payer: Self-pay

## 2022-09-23 ENCOUNTER — Emergency Department (HOSPITAL_COMMUNITY): Payer: BC Managed Care – PPO

## 2022-09-23 DIAGNOSIS — R059 Cough, unspecified: Secondary | ICD-10-CM | POA: Diagnosis not present

## 2022-09-23 DIAGNOSIS — J441 Chronic obstructive pulmonary disease with (acute) exacerbation: Secondary | ICD-10-CM | POA: Diagnosis not present

## 2022-09-23 DIAGNOSIS — Z1152 Encounter for screening for COVID-19: Secondary | ICD-10-CM | POA: Diagnosis not present

## 2022-09-23 DIAGNOSIS — R000 Tachycardia, unspecified: Secondary | ICD-10-CM | POA: Diagnosis not present

## 2022-09-23 DIAGNOSIS — R0602 Shortness of breath: Secondary | ICD-10-CM | POA: Diagnosis not present

## 2022-09-23 LAB — BASIC METABOLIC PANEL
Anion gap: 8 (ref 5–15)
BUN: 13 mg/dL (ref 6–20)
CO2: 24 mmol/L (ref 22–32)
Calcium: 8.5 mg/dL — ABNORMAL LOW (ref 8.9–10.3)
Chloride: 106 mmol/L (ref 98–111)
Creatinine, Ser: 0.86 mg/dL (ref 0.61–1.24)
GFR, Estimated: >60 mL/min (ref >60)
Glucose, Bld: 119 mg/dL — ABNORMAL HIGH (ref 70–99)
Potassium: 2.8 mmol/L — ABNORMAL LOW (ref 3.5–5.1)
Sodium: 138 mmol/L (ref 135–145)

## 2022-09-23 LAB — CBC WITH DIFFERENTIAL/PLATELET
Abs Immature Granulocytes: 0.07 10*3/uL (ref 0.00–0.07)
Basophils Absolute: 0 10*3/uL (ref 0.0–0.1)
Basophils Relative: 0 %
Eosinophils Absolute: 0.8 10*3/uL — ABNORMAL HIGH (ref 0.0–0.5)
Eosinophils Relative: 15 %
HCT: 40.3 % (ref 39.0–52.0)
Hemoglobin: 13.5 g/dL (ref 13.0–17.0)
Immature Granulocytes: 1 %
Lymphocytes Relative: 19 %
Lymphs Abs: 1 10*3/uL (ref 0.7–4.0)
MCH: 30.8 pg (ref 26.0–34.0)
MCHC: 33.5 g/dL (ref 30.0–36.0)
MCV: 92 fL (ref 80.0–100.0)
Monocytes Absolute: 0.1 10*3/uL (ref 0.1–1.0)
Monocytes Relative: 2 %
Neutro Abs: 3.3 10*3/uL (ref 1.7–7.7)
Neutrophils Relative %: 63 %
Platelets: 249 10*3/uL (ref 150–400)
RBC: 4.38 MIL/uL (ref 4.22–5.81)
RDW: 14 % (ref 11.5–15.5)
WBC: 5.4 10*3/uL (ref 4.0–10.5)
nRBC: 0 % (ref 0.0–0.2)

## 2022-09-23 MED ORDER — MAGNESIUM SULFATE 2 GM/50ML IV SOLN
2.0000 g | Freq: Once | INTRAVENOUS | Status: AC
  Administered 2022-09-23: 2 g via INTRAVENOUS
  Filled 2022-09-23: qty 50

## 2022-09-23 MED ORDER — KETOROLAC TROMETHAMINE 15 MG/ML IJ SOLN
15.0000 mg | Freq: Once | INTRAMUSCULAR | Status: AC
  Administered 2022-09-23: 15 mg via INTRAVENOUS
  Filled 2022-09-23: qty 1

## 2022-09-23 MED ORDER — METHYLPREDNISOLONE SODIUM SUCC 125 MG IJ SOLR
125.0000 mg | Freq: Once | INTRAMUSCULAR | Status: AC
  Administered 2022-09-23: 125 mg via INTRAVENOUS
  Filled 2022-09-23: qty 2

## 2022-09-23 MED ORDER — ALBUTEROL SULFATE (2.5 MG/3ML) 0.083% IN NEBU
5.0000 mg/h | INHALATION_SOLUTION | Freq: Once | RESPIRATORY_TRACT | Status: AC
  Administered 2022-09-23: 5 mg/h via RESPIRATORY_TRACT
  Filled 2022-09-23: qty 6

## 2022-09-23 MED ORDER — IPRATROPIUM-ALBUTEROL 0.5-2.5 (3) MG/3ML IN SOLN
9.0000 mL | Freq: Once | RESPIRATORY_TRACT | Status: AC
  Administered 2022-09-24: 9 mL via RESPIRATORY_TRACT
  Filled 2022-09-23: qty 9

## 2022-09-23 MED ORDER — GUAIFENESIN 100 MG/5ML PO LIQD
15.0000 mL | Freq: Once | ORAL | Status: AC
  Administered 2022-09-23: 15 mL via ORAL
  Filled 2022-09-23: qty 15

## 2022-09-23 NOTE — ED Provider Notes (Signed)
[place] EMERGENCY DEPARTMENT AT [facility] Provider Note   CSN: [id number] Arrival date & time: 09/23/22  2212     History Chief Complaint  Patient presents with   Shortness of Breath    Kiptyn Lamia is a 47 y.o. male with hx of asthma who presents to the emergency department today for further evaluation of shortness of breath that is been ongoing and worsening for the last 3 to 4 days.  Patient has been suffering from nonproductive cough.  Patient having difficulty breathing and friend provides some of the history.  He states that he has been like this for several days and he has been using his albuterol inhaler with little relief.  He reports associated rhinorrhea but denies fever and chills.   Shortness of Breath      Home Medications Prior to Admission medications   Medication Sig Start Date End Date Taking? Authorizing Provider  albuterol (VENTOLIN HFA) 108 (90 Base) MCG/ACT inhaler Inhale 2 puffs into the lungs every 4 (four) hours as needed. 12/10/21   Young, Joni Fears D, MD  amLODipine (NORVASC) 5 MG tablet TAKE 1 TABLET (5 MG TOTAL) BY MOUTH DAILY. 02/12/22   Marinus Maw, MD  azelastine (ASTELIN) 0.1 % nasal spray Place 2 sprays into both nostrils 2 (two) times daily. Use in each nostril as directed 09/19/22   Leath-Warren, Sadie Haber, NP  Budeson-Glycopyrrol-Formoterol (BREZTRI AEROSPHERE) 160-9-4.8 MCG/ACT AERO Inhale 2 puffs into the lungs 2 (two) times daily. Rinse mouth 12/10/21   Young, Joni Fears D, MD  Dupilumab (DUPIXENT) 300 MG/2ML SOPN INJECT 300 MG INTO THE SKIN EVERY 14 DAYS. 12/24/21   Jetty Duhamel D, MD  EPINEPHrine 0.3 mg/0.3 mL IJ SOAJ injection Inject into thigh for severe allergic reaction 04/29/15   Jetty Duhamel D, MD  levalbuterol (XOPENEX) 0.31 MG/3ML nebulizer solution Take 3 mLs (0.31 mg total) by nebulization every 4 (four) hours as needed for wheezing. 05/21/21   [name], MD  montelukast (SINGULAIR) 10 MG tablet Take 1 tablet (10  mg total) by mouth at bedtime. 09/19/22   Leath-Warren, Sadie Haber, NP  predniSONE (DELTASONE) 50 MG tablet Take 1 tablet daily with breakfast for the next 5 days 09/19/22   Leath-Warren, Sadie Haber, NP  primidone (MYSOLINE) 50 MG tablet Take 4 tablets (200 mg total) by mouth 2 (two) times daily. 04/27/22   Tat, Octaviano Batty, DO  tiZANidine (ZANAFLEX) 4 MG capsule Take 1 capsule (4 mg total) by mouth 3 (three) times daily as needed for muscle spasms. Do not drink alcohol or drive while taking this medication.  May cause drowsiness. Patient not taking: Reported on 07/12/2022 05/31/22   Particia Nearing, PA-C      Allergies    Azithromycin, Banana, Alvesco [ciclesonide], Anoro ellipta [umeclidinium-vilanterol], Methylprednisolone, and Penicillins    Review of Systems   Review of Systems  Respiratory:  Positive for shortness of breath.   All other systems reviewed and are negative.   Physical Exam Updated Vital Signs BP (!) 153/88 (BP Location: Right Arm)   Pulse (!) 103   Temp 98.9 F (37.2 C) (Oral)   Resp (!) 24   Wt 84.4 kg   SpO2 95%   BMI 23.25 kg/m  Physical Exam Vitals and nursing note reviewed.  Constitutional:      General: He is in acute distress.     Appearance: Normal appearance.  HENT:     Head: Normocephalic and atraumatic.  Eyes:     General:  Right eye: No discharge.        Left eye: No discharge.  Cardiovascular:     Comments: Regular rate and rhythm.  S1/S2 are distinct without any evidence of murmur, rubs, or gallops.  Radial pulses are 2+ bilaterally.  Dorsalis pedis pulses are 2+ bilaterally.  No evidence of pedal edema. Pulmonary:     Effort: Tachypnea present.     Breath sounds: Decreased breath sounds present.  Abdominal:     General: Abdomen is flat. Bowel sounds are normal. There is no distension.     Tenderness: There is no abdominal tenderness. There is no guarding or rebound.  Musculoskeletal:        General: Normal range of motion.      Cervical back: Neck supple.  Skin:    General: Skin is warm and dry.     Findings: No rash.  Neurological:     General: No focal deficit present.     Mental Status: He is alert.  Psychiatric:        Mood and Affect: Mood normal.        Behavior: Behavior normal.     ED Results / Procedures / Treatments   Labs (all labs ordered are listed, but only abnormal results are displayed) Labs Reviewed  RESP PANEL BY RT-PCR (RSV, FLU A&B, COVID)  [id number]  CBC WITH DIFFERENTIAL/PLATELET  BASIC METABOLIC PANEL    EKG EKG Interpretation  Date/Time:  Thursday Sep 23 2022 22:18:31 EDT Ventricular Rate:  106 PR Interval:  172 QRS Duration: 86 QT Interval:  332 QTC Calculation: 441 R Axis:   82 Text Interpretation: Sinus tachycardia Right atrial enlargement Minimal voltage criteria for LVH, may be normal variant ( Sokolow-Lyon ) Borderline ECG When compared with ECG of 18-Aug-2021 16:11, Vent. rate has increased BY  46 BPM Confirmed by Eber Hong ([id number]) on 09/23/2022 10:28:24 PM  Radiology No results found.  Procedures Procedures    Medications Ordered in ED Medications  albuterol (PROVENTIL,VENTOLIN) solution continuous neb (has no administration in time range)  magnesium sulfate IVPB 2 g 50 mL (has no administration in time range)  methylPREDNISolone sodium succinate (SOLU-MEDROL) 125 mg/2 mL injection 125 mg (has no administration in time range)    ED Course/ Medical Decision Making/ A&P   {   Click here for ABCD2, HEART and other calculators  Medical Decision Making Foley Arwood is a 47 y.o. male patient who presents to the emergency department today for further evaluation of shortness of breath.  On my initial evaluation, patient was certainly in respiratory distress.  There was some evidence of retracting and there was some decreased breath sounds heard in all lung fields.  Minimal wheeze.  Labs and chest x-ray were ordered in addition to respiratory panel.  I am going  to give the patient a continuous neb for the next hour, steroids, and mag.  Workup is still pending.  Due to shift change, the rest of his care be transferred to oncoming provider for ultimate disposition will be made.  I suspect that the patient improves from a breathing perspective and is able to move more air he might be able to go home if all workup is negative.  This will be dependent on the rest of workup which is still pending.  Amount and/or Complexity of Data Reviewed Labs: ordered. Radiology: ordered.  Risk Prescription drug management.    Final Clinical Impression(s) / ED Diagnoses Final diagnoses:  None    Rx / DC Orders ED  Discharge Orders     None         Jolyn Lent 09/23/22 2246    Eber Hong, MD 09/25/22 [phone]

## 2022-09-23 NOTE — ED Triage Notes (Signed)
POV/ [facility]/ hx of asthma/ pt currently out of meds/inhaler/ [facility] started Saturday/ non-productive cough present/ runny nose/ denies fever

## 2022-09-24 LAB — RESP PANEL BY RT-PCR (RSV, FLU A&B, COVID)  RVPGX2
Influenza A by PCR: NEGATIVE
Influenza B by PCR: NEGATIVE
Resp Syncytial Virus by PCR: NEGATIVE
SARS Coronavirus 2 by RT PCR: NEGATIVE

## 2022-09-24 LAB — BLOOD GAS, VENOUS
Acid-Base Excess: 1.3 mmol/L (ref 0.0–2.0)
Bicarbonate: 25.9 mmol/L (ref 20.0–28.0)
Drawn by: 1528
O2 Saturation: 80.4 %
Patient temperature: 37.2
pCO2, Ven: 40 mmHg — ABNORMAL LOW (ref 44–60)
pH, Ven: 7.42 (ref 7.25–7.43)
pO2, Ven: 48 mmHg — ABNORMAL HIGH (ref 32–45)

## 2022-09-24 MED ORDER — PREDNISONE 10 MG PO TABS: 40.0000 mg | ORAL_TABLET | Freq: Every day | ORAL | 0 refills | Status: AC

## 2022-09-24 MED ORDER — LEVALBUTEROL HCL 0.31 MG/3ML IN NEBU: 1.0000 | INHALATION_SOLUTION | RESPIRATORY_TRACT | 12 refills | Status: DC | PRN

## 2022-09-24 NOTE — ED Provider Notes (Signed)
  Physical Exam  BP 130/63 (BP Location: Left Arm)   Pulse 89   Temp 98.6 F (37 C) (Oral)   Resp 18   Wt 84.4 kg   SpO2 94%   BMI 23.25 kg/m   Physical Exam Constitutional:      General: He is not in acute distress.    Appearance: Normal appearance.  HENT:     Head: Normocephalic and atraumatic.     Nose: No congestion or rhinorrhea.  Eyes:     General:        Right eye: No discharge.        Left eye: No discharge.     Extraocular Movements: Extraocular movements intact.     Pupils: Pupils are equal, round, and reactive to light.  Cardiovascular:     Rate and Rhythm: Normal rate and regular rhythm.     Heart sounds: No murmur heard. Pulmonary:     Effort: No respiratory distress.     Breath sounds: Wheezing present. No rales.  Abdominal:     General: There is no distension.     Tenderness: There is no abdominal tenderness.  Musculoskeletal:        General: Normal range of motion.     Cervical back: Normal range of motion.  Skin:    General: Skin is warm and dry.  Neurological:     General: No focal deficit present.     Mental Status: He is alert.     Procedures  .Critical Care  Performed by: Glendora Score, MD Authorized by: Glendora Score, MD   Critical care provider statement:    Critical care time (minutes):  30   Critical care was necessary to treat or prevent imminent or life-threatening deterioration of the following conditions:  Respiratory failure   Critical care was time spent personally by me on the following activities:  Development of treatment plan with patient or surrogate, discussions with consultants, evaluation of patient's response to treatment, examination of patient, ordering and review of laboratory studies, ordering and review of radiographic studies, ordering and performing treatments and interventions, pulse oximetry, re-evaluation of patient's condition and review of old charts   ED Course / MDM    Medical Decision Making Amount  and/or Complexity of Data Reviewed Labs: ordered. Radiology: ordered.  Risk OTC drugs. Prescription drug management.   Patient received in handoff.  COPD exacerbation/asthma exacerbation with tachypnea and increased work of breathing.  Patient received an albuterol cat but no Atrovent at time of signout.  On reevaluation, patient still persistently wheezy but feeling symptomatically improved.  Patient received 3 DuoNebs and on reevaluation his wheezing almost entirely resolved.  He is able to ambulate in the emergency department without difficulty and passed his amatory pulse ox trial.  We used shared decision-making and I did offer hospital admission but he is requesting discharge and with his symptoms resolved, patient then discharged with outpatient follow-up and a prescription for prednisone.  Return precautions given which he voiced understanding.       Glendora Score, MD 09/24/22 [phone]

## 2022-09-24 NOTE — ED Notes (Signed)
Pt tolerated ambulating- O2 sats stayed between 92-94% RA

## 2022-09-28 ENCOUNTER — Encounter: Payer: Self-pay | Admitting: Internal Medicine

## 2022-09-28 ENCOUNTER — Ambulatory Visit (INDEPENDENT_AMBULATORY_CARE_PROVIDER_SITE_OTHER): Payer: BC Managed Care – PPO | Admitting: Internal Medicine

## 2022-09-28 ENCOUNTER — Telehealth: Payer: Self-pay | Admitting: Internal Medicine

## 2022-09-28 VITALS — BP 124/88 | HR 64 | Ht 75.0 in | Wt 189.0 lb

## 2022-09-28 DIAGNOSIS — I471 Supraventricular tachycardia, unspecified: Secondary | ICD-10-CM | POA: Diagnosis not present

## 2022-09-28 DIAGNOSIS — J4551 Severe persistent asthma with (acute) exacerbation: Secondary | ICD-10-CM

## 2022-09-28 MED ORDER — PREDNISONE 10 MG PO TABS: ORAL_TABLET | ORAL | 0 refills | Status: DC

## 2022-09-28 MED ORDER — YUPELRI 175 MCG/3ML IN SOLN: 175.0000 ug | Freq: Every day | RESPIRATORY_TRACT | 0 refills | Status: DC

## 2022-09-28 MED ORDER — EPINEPHRINE 0.3 MG/0.3ML IJ SOAJ: INTRAMUSCULAR | 99 refills | Status: AC

## 2022-09-28 MED ORDER — TRAMADOL HCL 50 MG PO TABS: ORAL_TABLET | ORAL | 0 refills | Status: DC

## 2022-09-28 NOTE — Patient Instructions (Addendum)
Script sent for prednisone and for tramadol to take for cough if needed  Keep September appointment- earlier if needed

## 2022-09-28 NOTE — Telephone Encounter (Signed)
Called and spoke with patient. Pt has been seen twice at urgent care for a allergic reaction and cop exacerbation. Patient called stating he has a persistent cough for over 2 weeks and sob. Pt denied having any wheezing, nausea or vomiting. Dr. Maple Hudson please advise on recommendation for this patient

## 2022-09-28 NOTE — Telephone Encounter (Signed)
Patient states having symptom of cough and shortness of breath. Pharmacy is [facility]. Patient phone number is [phone].

## 2022-09-28 NOTE — Progress Notes (Unsigned)
Patient ID: Drew Sandoval, male    DOB: 05-04-1976    MRN: [id number]  HPI M  former smoker followed for severe asthma/ COPD, chronic sinusitis, hyper-eosinophilia and hyper IgE, complicated by history SVT/cardioversion FENO 03/18/16- 100  High, indicates allergic asthma Office Spirometry 03/18/2016-severe obstructive airways disease FVC 2.62/51%, FEV1 1.30/31%, FEV1/FVC 0.50, FEF 25-75 0.61/14% Office Spirometry 01/25/17-severe obstructive airways disease. FVC 3.52/68%, FEV1 1.80/43%, ratio or 0.51, FEF 25-75% 0.76/18% IgE was too high for Xolair. He could not afford Nucala or allergy vaccine. CBC w diff 08/18/21- EOS H 17.6K/Ul Cinqair infusion@ [facility]- ordered 07/21/17 ----------------------------------------------------  07/12/22- 47 year old male former smoker (5.75 pk yrs) followed for severe asthma/ COPD/ Dupixent, chronic sinusitis, persistent eosinophilia, hyper IgE-too high for Xolair, history SVT/cardioversion/ ablation, Kidney stone, Covid infection Jan, 2022, HTN, Tremor,  -Breztri, Dupixent, Singulair, Ventolin hfa, neb levalbuterol Covid vax-2 Phizer Flu vax-had ED 05/31/22- Viral URI, Asthma exacerbation, strain of L shoulder> prednisone -----Asthma flares, Head colds He feels he has had a recent head cold and now has sinus infection.  Chest feels comfortable.  He tends to put up with a certain amount of chest tightness and cough at baseline.  Continues Dupixent and insurance issues were cleared so he is able to get this regularly.  No recent SVT.  Continues to follow cardiology.  09/28/22- 47 year old male former smoker (5.75 pk yrs) followed for severe Asthma/ COPD/ Dupixent, chronic Sinusitis, persistent Eosinophilia, hyper IgE-too high for Xolair, history SVT/cardioversion/ ablation, Kidney stone, Covid infection Jan, 2022, HTN, Tremor,  -Breztri, Dupixent, Singulair, Ventolin hfa, neb levalbuterol Urgent care x 2 recently for Asthma/ COPD. K was 2.8. -----Patient complains  of Dry cough, SOB and chest tightness. Patient is not sleeping well. Symptoms present x1 week ED/UC visits 5/12 and 5/18.  He has all of his meds and has continued Dupixent injections.  Bothersome cough feels like barely controlled asthma.  He denies by previous infection.  Says problems started when he mowed his lawn wearing a mask.  He thinks pollen got in through his eyes.  Asks EpiPen refill.  We discussed options.  We discussed extending prednisone and adding samples Yupelri nebulizer solution. CXR 09/23/22 1V- IMPRESSION: No active cardiopulmonary disease.   ROS-see HPI  "+" = positive Constitutional:    weight loss, night sweats, fevers, chills, fatigue, lassitude. HEENT:    headaches, difficulty swallowing, tooth/dental problems, sore throat,       sneezing, itching, ear ache, +nasal congestion, +post nasal drip, snoring CV:    chest pain, orthopnea, PND, swelling in lower extremities, anasarca,  dizziness, +palpitations Resp:   +shortness of breath with exertion or at rest.               + productive cough,  non-productive cough, coughing up of blood.              change in color of mucus.  +wheezing.   Skin:    rash or lesions. GI:  No-   heartburn, indigestion, abdominal pain, nausea, vomiting,  GU: d. MS:   joint pain, stiffness, decreased range of motion, back pain. Neuro-     nothing unusual Psych:  change in mood or affect.  depression or anxiety.   memory loss.  Objective:   Physical Exam General- Alert, Oriented, Affect-reserved/ laconic, Distress-none acute;  Tall, thin Skin- +tatoos Lymphadenopathy- none Head- atraumatic            Eyes- Gross vision intact, PERRLA, conjunctivae clear secretions  Ears- Hearing, canals normal            Nose- +stuffy, no-Septal dev, mucus, polyps, erosion, perforation             Throat- Mallampati II , mucosa clear , drainage- none, tonsils- atrophic Neck- flexible , trachea midline, no stridor , thyroid nl, carotid no  bruit Chest - symmetrical excursion , unlabored           Heart/CV- RRR  , no murmur , no gallop  , no rub, nl s1 s2                           - JVD- none , edema- none, stasis changes- none, varices- none           Lung-   +Diminished/ clear, unlabored,  Cough+ dullness-none, rub- none,  Wheeze-none Abd- scaphoid  Br/ Gen/ Rectal- Not done, not indicated Extrem- cyanosis- none, clubbing, none, atrophy- none, strength- nl Neuro- unremarkable

## 2022-09-29 ENCOUNTER — Encounter: Payer: Self-pay | Admitting: Internal Medicine

## 2022-09-29 NOTE — Assessment & Plan Note (Signed)
Continues follow-up with cardiology

## 2022-09-29 NOTE — Assessment & Plan Note (Signed)
Recent exacerbation.  Uncertain of trigger was related to exposures following his lawn since he was wearing a mask. Plan-refill EpiPen, extend prednisone, samples Yupelri nebulizer solution

## 2022-10-01 ENCOUNTER — Other Ambulatory Visit (HOSPITAL_COMMUNITY): Payer: Self-pay

## 2022-10-13 ENCOUNTER — Other Ambulatory Visit (HOSPITAL_COMMUNITY): Payer: Self-pay

## 2022-10-20 ENCOUNTER — Other Ambulatory Visit: Payer: Self-pay

## 2022-10-22 NOTE — Progress Notes (Deleted)
Assessment/Plan:    1.  Essential Tremor, exacerbated by medication for asthma  -*** primidone, 4 tablets in the morning, 4 tablets at night .   -understands that tremor likely will never be fully under control due to other medications that induce tremor   -cardiology started him on low dose metoprolol for SVT.  It is probably too low dose to help tremor but given severity of asthma, wouldn't want to increase BB even if able.  -Discussed again with the patient that there really is not much more medical wise that we can do for his tremor.  His fiancee came for the first time today and we again discussed dbs vs focused ultrasound in detail.  He states today he is thinking about it.  The L hand is much worse than the right and he would just need unilateral R VIM surgery (he is L handed).     2.  Severe Asthma  -Medications for this contribute to tremor  3.  SVT  -Following with cardiology.  Subjective:   Drew Sandoval was seen today in follow up for essential tremor, which is much exacerbated by medication for his asthma. Pt with fiancee who supplements hx.    My previous records were reviewed prior to todays visit.  He remains frustrated with tremor. He has good and bad days.   We tried Cogentin last visit and he stopped it because of sexual side effects.  He asked what else there was to be done.  We just slightly increased the primidone, but also told him that he really did not have many other medical options, short of surgical options.  He thinks primidone helps and asks about increasing it further.   Current prescribed movement disorder medications: Primidone, 50 mg, 4 tablets twice per day   PREVIOUS MEDICATIONS:  primidone ; artane (thought caused hand/feet itching); Topamax (not tried because of nephrolithiasis); Cogentin (discontinued because of sexual side effects; gabapentin (not helpful)  ALLERGIES:   Allergies  Allergen Reactions   Azithromycin Rash   Banana Swelling     Throat swelling    Alvesco [Ciclesonide] Rash    Possible reaction- rash   Anoro Ellipta [Umeclidinium-Vilanterol] Rash    Possible- rash   Methylprednisolone Other (See Comments)    unknown   Penicillins Rash    Has patient had a PCN reaction causing immediate rash, facial/tongue/throat swelling, SOB or lightheadedness with hypotension: Yes Has patient had a PCN reaction causing severe rash involving mucus membranes or skin necrosis: No Has patient had a PCN reaction that required hospitalization Yes Has patient had a PCN reaction occurring within the last 10 years: No If all of the above answers are "NO", then may proceed with Cephalosporin use.      CURRENT MEDICATIONS:  Outpatient Encounter Medications as of 10/27/2022  Medication Sig   albuterol (VENTOLIN HFA) 108 (90 Base) MCG/ACT inhaler Inhale 2 puffs into the lungs every 4 (four) hours as needed.   amLODipine (NORVASC) 5 MG tablet TAKE 1 TABLET (5 MG TOTAL) BY MOUTH DAILY.   azelastine (ASTELIN) 0.1 % nasal spray Place 2 sprays into both nostrils 2 (two) times daily. Use in each nostril as directed   Budeson-Glycopyrrol-Formoterol (BREZTRI AEROSPHERE) 160-9-4.8 MCG/ACT AERO Inhale 2 puffs into the lungs 2 (two) times daily. Rinse mouth   Dupilumab (DUPIXENT) 300 MG/2ML SOPN INJECT 300 MG INTO THE SKIN EVERY 14 DAYS.   EPINEPHrine 0.3 mg/0.3 mL IJ SOAJ injection Inject into thigh for severe allergic reaction  levalbuterol (XOPENEX) 0.31 MG/3ML nebulizer solution Take 3 mLs (0.31 mg total) by nebulization every 4 (four) hours as needed for wheezing.   montelukast (SINGULAIR) 10 MG tablet Take 1 tablet (10 mg total) by mouth at bedtime.   predniSONE (DELTASONE) 10 MG tablet 2 tabs each morning x 7 days, then one daily   primidone (MYSOLINE) 50 MG tablet Take 4 tablets (200 mg total) by mouth 2 (two) times daily.   revefenacin (YUPELRI) 175 MCG/3ML nebulizer solution Take 3 mLs (175 mcg total) by nebulization daily.    tiZANidine (ZANAFLEX) 4 MG capsule Take 1 capsule (4 mg total) by mouth 3 (three) times daily as needed for muscle spasms. Do not drink alcohol or drive while taking this medication.  May cause drowsiness.   traMADol (ULTRAM) 50 MG tablet 1 every 8 hours if needed for  cough   No facility-administered encounter medications on file as of 10/27/2022.     Objective:    PHYSICAL EXAMINATION:    VITALS:   There were no vitals filed for this visit.   GEN:  The patient appears stated age and is in NAD. HEENT:  Normocephalic, atraumatic.  The mucous membranes are moist.   Neurological examination:  Orientation: The patient is alert and oriented x3. Cranial nerves: There is good facial symmetry. The speech is fluent and clear. Soft palate rises symmetrically and there is no tongue deviation. Hearing is intact to conversational tone. Sensation: Sensation is intact to light touch throughout Motor: Strength is at least antigravity x4.  Movement examination: Tone: There is normal tone in the UE/LE Abnormal movements: L>>>R intention tremor.  This is stable c/t previous Coordination:  There is no decremation with RAM's Gait and Station: The patient has no difficulty arising out of a deep-seated chair without the use of the hands. The patient's stride length is good I have reviewed and interpreted the following labs independently   Chemistry      Component Value Date/Time   NA 138 09/23/2022 2300   K 2.8 (L) 09/23/2022 2300   CL 106 09/23/2022 2300   CO2 24 09/23/2022 2300   BUN 13 09/23/2022 2300   CREATININE 0.86 09/23/2022 2300      Component Value Date/Time   CALCIUM 8.5 (L) 09/23/2022 2300   ALKPHOS 62 06/13/2021 0021   AST 17 06/13/2021 0021   ALT 15 06/13/2021 0021   BILITOT 0.5 06/13/2021 0021      Lab Results  Component Value Date   WBC 5.4 09/23/2022   HGB 13.5 09/23/2022   HCT 40.3 09/23/2022   MCV 92.0 09/23/2022   PLT 249 09/23/2022   Lab Results  Component  Value Date   TSH 0.47 10/08/2021     Chemistry      Component Value Date/Time   NA 138 09/23/2022 2300   K 2.8 (L) 09/23/2022 2300   CL 106 09/23/2022 2300   CO2 24 09/23/2022 2300   BUN 13 09/23/2022 2300   CREATININE 0.86 09/23/2022 2300      Component Value Date/Time   CALCIUM 8.5 (L) 09/23/2022 2300   ALKPHOS 62 06/13/2021 0021   AST 17 06/13/2021 0021   ALT 15 06/13/2021 0021   BILITOT 0.5 06/13/2021 0021      Total time spent on today's visit was *** minutes, including both face-to-face time and nonface-to-face time.  Time included that spent on review of records (prior notes available to me/labs/imaging if pertinent), discussing treatment and goals, answering patient's questions and coordinating care.  Cc:  Pcp, No

## 2022-10-27 ENCOUNTER — Ambulatory Visit: Payer: BC Managed Care – PPO | Admitting: Neurology

## 2022-10-27 ENCOUNTER — Encounter: Payer: Self-pay | Admitting: Neurology

## 2022-10-27 ENCOUNTER — Other Ambulatory Visit: Payer: Self-pay | Admitting: Neurology

## 2022-10-27 DIAGNOSIS — G250 Essential tremor: Secondary | ICD-10-CM

## 2022-10-27 DIAGNOSIS — Z029 Encounter for administrative examinations, unspecified: Secondary | ICD-10-CM

## 2022-10-30 ENCOUNTER — Other Ambulatory Visit: Payer: Self-pay | Admitting: Internal Medicine

## 2022-11-03 ENCOUNTER — Other Ambulatory Visit (HOSPITAL_COMMUNITY): Payer: Self-pay

## 2022-11-04 ENCOUNTER — Other Ambulatory Visit: Payer: Self-pay

## 2022-11-05 ENCOUNTER — Other Ambulatory Visit: Payer: Self-pay | Admitting: Internal Medicine

## 2022-11-05 NOTE — Telephone Encounter (Signed)
Refill was sent by pcp. Additional refills were attached to script

## 2022-11-08 ENCOUNTER — Other Ambulatory Visit: Payer: Self-pay

## 2022-11-09 ENCOUNTER — Other Ambulatory Visit (HOSPITAL_COMMUNITY): Payer: Self-pay

## 2022-11-09 ENCOUNTER — Other Ambulatory Visit: Payer: Self-pay

## 2022-11-12 ENCOUNTER — Other Ambulatory Visit (HOSPITAL_COMMUNITY): Payer: Self-pay

## 2022-11-22 ENCOUNTER — Ambulatory Visit
Admission: RE | Admit: 2022-11-22 | Discharge: 2022-11-22 | Disposition: A | Discharge status: Home / Self Care | Payer: BC Managed Care – PPO | Source: Ambulatory Visit | Attending: Nurse Practitioner | Admitting: Nurse Practitioner

## 2022-11-22 ENCOUNTER — Ambulatory Visit: Payer: BC Managed Care – PPO

## 2022-11-22 VITALS — BP 133/81 | HR 73 | Temp 97.9°F | Resp 17

## 2022-11-22 DIAGNOSIS — J321 Chronic frontal sinusitis: Secondary | ICD-10-CM

## 2022-11-22 DIAGNOSIS — Z8709 Personal history of other diseases of the respiratory system: Secondary | ICD-10-CM | POA: Diagnosis not present

## 2022-11-22 MED ORDER — PREDNISONE 50 MG PO TABS: ORAL_TABLET | ORAL | 0 refills | Status: DC

## 2022-11-22 MED ORDER — DOXYCYCLINE HYCLATE 100 MG PO TABS: 100.0000 mg | ORAL_TABLET | Freq: Two times a day (BID) | ORAL | 0 refills | Status: AC

## 2022-11-22 MED ORDER — PSEUDOEPH-BROMPHEN-DM 30-2-10 MG/5ML PO SYRP: 5.0000 mL | ORAL_SOLUTION | Freq: Four times a day (QID) | ORAL | 0 refills | Status: DC | PRN

## 2022-11-22 MED ORDER — ALBUTEROL SULFATE HFA 108 (90 BASE) MCG/ACT IN AERS: 2.0000 | INHALATION_SPRAY | Freq: Four times a day (QID) | RESPIRATORY_TRACT | 2 refills | Status: DC | PRN

## 2022-11-22 NOTE — ED Provider Notes (Signed)
[facility]    CSN: [id number] Arrival date & time: 11/22/22  4132      History   Chief Complaint Chief Complaint  Patient presents with   Cough    My head is congestion and my throat hurts - Entered by patient   Nasal Congestion    HPI Drew Sandoval is a 47 y.o. male.   The history is provided by the patient.   The patient presents for complaints of nasal congestion, headache, and cough.  Patient states symptoms have been present for the past 2 months.  He denies fever, chills, sore throat, ear pain, ear drainage, difficulty breathing, chest pain, abdominal pain, nausea, vomiting, or diarrhea.  Patient reports that he does have a history of asthma.  He states that his asthma has been controlled over the last 1 to 2 months.  He was seen in this clinic, in the ER, and by his pulmonologist for those symptoms.  Patient states that he has been taking Mucinex, over-the-counter sinus medication, and using his current allergy regimen with minimal relief of his symptoms.  Past Medical History:  Diagnosis Date   Acute conjunctivitis of right eye 10/07/2014   Acute respiratory failure with hypoxia (HCC) 07/23/2013   Asthma    Asthma    Phreesia 06/26/2020   Asthma, severe persistent 07/16/2010   PFT >> spiro 2012 with FEV1 1.14 (28%), ratio 45 Alpha 1 (08/12/10) >>MM ([facility])  IGE 1747, positive allergy profile >> Elevated eos on CBC S/p intubation 10/2010 Job exposure to respiratory irritant - bleach, chicken Allergy vaccine begun 11/09/2010. DC'd by pt 2013- resumed 2013> 1:50 GH; DC'd [facility] 9/24-9/27/ 2013- Status asthma, no vent. [facility] 3/ 2015- status ast   COVID-19 virus infection 06/17/2020   January, 2022. No hosp, no MAB   Eczema, allergic 07/22/2011   Essential tremor 06/27/2020   Hypertension    Seasonal allergies    Seasonal and perennial allergic rhinitis 07/17/2014   FENO 03/18/16- 100  High, indicates allergic asthma    Sinusitis, maxillary, 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   Sinus CT 12/10/2013 >>>    Status asthmaticus 10/19/2010   Sustained SVT 09/24/2014   SVT (supraventricular tachycardia) 09/24/2014    Patient Active Problem List   Diagnosis Date Noted   Aspiration pneumonia (HCC) 08/26/2021   Hematuria 06/11/2021   Periumbilical abdominal pain 06/11/2021   Thyroid nodule 11/18/2020   Essential tremor 06/27/2020   Hypertension, essential 06/17/2020   Gynecomastia, male 04/24/2018   Sustained SVT (HCC) 09/24/2014   SVT (supraventricular tachycardia) (HCC) 09/24/2014   Malnutrition of moderate degree (HCC) 09/24/2014   Seasonal and perennial allergic rhinitis 07/17/2014   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 Authorizing Provider  albuterol (VENTOLIN HFA) 108 (90 Base) MCG/ACT inhaler Inhale 2 puffs into the lungs every 6 (six) hours as needed for wheezing or shortness of breath. 11/22/22  Yes Alethea Terhaar-Warren, Sadie Haber, NP  amLODipine (NORVASC) 5 MG tablet TAKE 1 TABLET (5 MG TOTAL)  BY MOUTH DAILY. 02/12/22  Yes Marinus Maw, MD  Budeson-Glycopyrrol-Formoterol (BREZTRI AEROSPHERE) 160-9-4.8 MCG/ACT AERO Inhale 2 puffs into the lungs 2 (two) times daily. Rinse mouth 12/10/21  Yes Young, Joni Fears D, MD  doxycycline (VIBRA-TABS) 100 MG tablet Take 1 tablet (100 mg total) by mouth 2 (two) times daily for 7 days. 11/22/22 11/29/22 Yes Joyann Spidle-Warren, Sadie Haber, NP  Dupilumab (DUPIXENT) 300 MG/2ML SOPN INJECT 300 MG INTO THE SKIN EVERY 14 DAYS.  12/24/21  Yes Young, Joni Fears D, MD  levalbuterol (XOPENEX) 0.31 MG/3ML nebulizer solution Take 3 mLs (0.31 mg total) by nebulization every 4 (four) hours as needed for wheezing. 09/24/22  Yes Kommor, Madison, MD  montelukast (SINGULAIR) 10 MG tablet Take 1 tablet (10 mg total) by mouth at bedtime. 09/19/22  Yes Naethan Bracewell-Warren, Sadie Haber, NP  predniSONE (DELTASONE) 50 MG tablet Take 1 tablet by mouth daily with breakfast. 11/22/22  Yes Elza Varricchio-Warren, Sadie Haber, NP  primidone (MYSOLINE) 50 MG tablet Take 4 tablets (200 mg total) by mouth 2 (two) times daily. 04/27/22  Yes Tat, Octaviano Batty, DO  revefenacin (YUPELRI) 175 MCG/3ML nebulizer solution Take 3 mLs (175 mcg total) by nebulization daily. 09/28/22  Yes Young, Joni Fears D, MD  azelastine (ASTELIN) 0.1 % nasal spray Place 2 sprays into both nostrils 2 (two) times daily. Use in each nostril as directed 09/19/22   Leea Rambeau-Warren, Sadie Haber, NP  EPINEPHrine 0.3 mg/0.3 mL IJ SOAJ injection Inject into thigh for severe allergic reaction 09/28/22   Jetty Duhamel D, MD  tiZANidine (ZANAFLEX) 4 MG capsule Take 1 capsule (4 mg total) by mouth 3 (three) times daily as needed for muscle spasms. Do not drink alcohol or drive while taking this medication.  May cause drowsiness. 05/31/22   Particia Nearing, PA-C  traMADol Janean Sark) 50 MG tablet 1 every 8 hours if needed for  cough 09/28/22   [name], MD    Family History Family History  Problem Relation Age of Onset   Lung cancer Mother    Emphysema Father    Arthritis Sister    Diabetes Maternal Grandmother    Thyroid disease Neg Hx     Social History Social History   Tobacco Use   Smoking status: Former    Current packs/day: 0.00    Average packs/day: 0.5 packs/day for 11.5 years (5.8 ttl pk-yrs)    Types: Cigarettes    Start date: 10/09/1998    Quit date: 04/09/2010    Years since quitting: 12.6   Smokeless tobacco: Former   Tobacco comments:    started at age 40.  1/2 ppd.    Vaping Use    Vaping status: Never Used  Substance Use Topics   Alcohol use: Yes    Comment: rarely   Drug use: No     Allergies   Azithromycin, Banana, Alvesco [ciclesonide], Anoro ellipta [umeclidinium-vilanterol], Methylprednisolone, and Penicillins   Review of Systems Review of Systems Per HPI  Physical Exam Triage Vital Signs ED Triage Vitals  Encounter Vitals Group     BP 11/22/22 0846 133/81     Systolic BP Percentile --      Diastolic BP Percentile --      Pulse Rate 11/22/22 0846 73     Resp 11/22/22 0846 17     Temp 11/22/22 0846 97.9 F (36.6 C)     Temp Source 11/22/22 0846 Oral     SpO2 11/22/22 0846 97 %     Weight --  Height --      Head Circumference --      Peak Flow --      Pain Score 11/22/22 0916 10     Pain Loc --      Pain Education --      Exclude from Growth Chart --    No data found.  Updated Vital Signs BP 133/81 (BP Location: Right Arm)   Pulse 73   Temp 97.9 F (36.6 C) (Oral)   Resp 17   SpO2 97%   Visual Acuity Right Eye Distance:   Left Eye Distance:   Bilateral Distance:    Right Eye Near:   Left Eye Near:    Bilateral Near:     Physical Exam Vitals and nursing note reviewed.  Constitutional:      General: He is not in acute distress.    Appearance: Normal appearance.  HENT:     Head: Normocephalic.     Right Ear: Tympanic membrane, ear canal and external ear normal.     Left Ear: Tympanic membrane, ear canal and external ear normal.     Nose: Congestion present. No rhinorrhea.     Right Turbinates: Enlarged and swollen.     Left Turbinates: Enlarged and swollen.     Right Sinus: Frontal sinus tenderness present. No maxillary sinus tenderness.     Left Sinus: Frontal sinus tenderness present. No maxillary sinus tenderness.     Mouth/Throat:     Lips: Pink.     Mouth: Mucous membranes are moist.     Pharynx: Oropharynx is clear. Uvula midline. Posterior oropharyngeal erythema present. No pharyngeal swelling, oropharyngeal  exudate or uvula swelling.     Comments: Cobblestoning present to posterior oropharynx Eyes:     Extraocular Movements: Extraocular movements intact.     Conjunctiva/sclera: Conjunctivae normal.     Pupils: Pupils are equal, round, and reactive to light.  Cardiovascular:     Rate and Rhythm: Normal rate and regular rhythm.     Pulses: Normal pulses.     Heart sounds: Normal heart sounds.  Pulmonary:     Effort: Pulmonary effort is normal. No respiratory distress.     Breath sounds: Normal breath sounds. No stridor. No wheezing, rhonchi or rales.  Abdominal:     General: Bowel sounds are normal.     Palpations: Abdomen is soft.     Tenderness: There is no abdominal tenderness.  Musculoskeletal:     Cervical back: Normal range of motion.  Lymphadenopathy:     Cervical: No cervical adenopathy.  Skin:    General: Skin is warm and dry.  Neurological:     General: No focal deficit present.     Mental Status: He is alert and oriented to person, place, and time.  Psychiatric:        Mood and Affect: Mood normal.        Behavior: Behavior normal.      UC Treatments / Results  Labs (all labs ordered are listed, but only abnormal results are displayed) Labs Reviewed - No data to display  EKG   Radiology No results found.  Procedures Procedures (including critical care time)  Medications Ordered in UC Medications - No data to display  Initial Impression / Assessment and Plan / UC Course  I have reviewed the triage vital signs and the nursing notes.  Pertinent labs & imaging results that were available during my care of the patient were reviewed by me and considered in my medical  decision making (see chart for details).  The patient is with appearing, he is in no acute distress, vital signs are stable.  Will treat for acute frontal sinusitis with doxycycline 100 mg.  For patient's cough, Bromfed-DM was prescribed.  Prednisone 50mg  was prescribed for chronic allergic  rhinitis and for asthma.  Supportive care recommendations were provided and discussed with the patient to include use of over-the-counter analgesics for pain or discomfort, use of normal saline nasal spray throughout the day for nasal congestion, and continuing his current allergy regimen.  Patient was advised to follow-up for worsening wheezing, difficulty breathing, or shortness of breath, either in this clinic or in the ER.  Patient was also advised to follow-up with his PCP or with pulmonology if symptoms fail to improve.  Patient is in agreement with this plan of care and verbalizes understanding.  All questions were answered.  Patient stable for discharge.  Final Clinical Impressions(s) / UC Diagnoses   Final diagnoses:  Frontal sinusitis, unspecified chronicity  History of allergic rhinitis  History of asthma     Discharge Instructions      Take medication as prescribed. Continue your current allergy medications. Increase fluids and allow for plenty of rest. May take over-the-counter Tylenol or ibuprofen as needed for pain, fever, general discomfort. Recommend using normal saline nasal spray throughout the day to help with nasal congestion and runny nose. Recommend using a humidifier in your bedroom at nighttime during sleep and sleeping elevated on pillows while cough symptoms persist. Follow-up immediately in this clinic or in the ER if you experience wheezing, shortness of breath, difficulty breathing.  If you become unable to speak in a complete sentence, with severe difficulty breathing, please go to the ER. Follow-up with your pulmonologist or primary care physician if symptoms fail to improve. Follow-up as needed.     ED Prescriptions     Medication Sig Dispense Auth. Provider   doxycycline (VIBRA-TABS) 100 MG tablet Take 1 tablet (100 mg total) by mouth 2 (two) times daily for 7 days. 14 tablet Tabb Croghan-Warren, Sadie Haber, NP   predniSONE (DELTASONE) 50 MG tablet Take 1  tablet by mouth daily with breakfast. 5 tablet Navada Osterhout-Warren, Sadie Haber, NP   albuterol (VENTOLIN HFA) 108 (90 Base) MCG/ACT inhaler Inhale 2 puffs into the lungs every 6 (six) hours as needed for wheezing or shortness of breath. 8 g Lashawn Orrego-Warren, Sadie Haber, NP      PDMP not reviewed this encounter.   Abran Cantor, NP 11/22/22 [phone]

## 2022-11-22 NOTE — ED Triage Notes (Signed)
Congestion, cough, that started 2 months ago. Taking mucinx, nyquil, robitussin, with no relief of symptoms.

## 2022-11-22 NOTE — Discharge Instructions (Addendum)
Take medication as prescribed. Continue your current allergy medications. Increase fluids and allow for plenty of rest. May take over-the-counter Tylenol or ibuprofen as needed for pain, fever, general discomfort. Recommend using normal saline nasal spray throughout the day to help with nasal congestion and runny nose. Recommend using a humidifier in your bedroom at nighttime during sleep and sleeping elevated on pillows while cough symptoms persist. Follow-up immediately in this clinic or in the ER if you experience wheezing, shortness of breath, difficulty breathing.  If you become unable to speak in a complete sentence, with severe difficulty breathing, please go to the ER. Follow-up with your pulmonologist or primary care physician if symptoms fail to improve. Follow-up as needed.

## 2022-11-25 ENCOUNTER — Other Ambulatory Visit (HOSPITAL_COMMUNITY): Payer: Self-pay

## 2022-12-02 ENCOUNTER — Other Ambulatory Visit (HOSPITAL_COMMUNITY): Payer: Self-pay

## 2022-12-06 ENCOUNTER — Other Ambulatory Visit (HOSPITAL_COMMUNITY): Payer: Self-pay

## 2022-12-07 ENCOUNTER — Other Ambulatory Visit: Payer: Self-pay | Admitting: Internal Medicine

## 2022-12-07 ENCOUNTER — Other Ambulatory Visit (HOSPITAL_COMMUNITY): Payer: Self-pay

## 2022-12-07 DIAGNOSIS — J4551 Severe persistent asthma with (acute) exacerbation: Secondary | ICD-10-CM

## 2022-12-08 ENCOUNTER — Other Ambulatory Visit (HOSPITAL_COMMUNITY): Payer: Self-pay

## 2022-12-08 ENCOUNTER — Other Ambulatory Visit: Payer: Self-pay

## 2022-12-08 MED ORDER — DUPIXENT 300 MG/2ML ~~LOC~~ SOAJ
SUBCUTANEOUS | 3 refills | Status: DC
Start: 2022-12-08 — End: 2023-04-20
  Filled 2022-12-08: qty 4, 28d supply, fill #0

## 2022-12-08 NOTE — Telephone Encounter (Signed)
Refill sent for DUPIXENT to [facility]: [phone]   Dose: 300 mg SQ every 2 weeks  Last OV: 09/28/2022 Provider: Dr. Maple Hudson  Next OV: 01/13/2023  Chesley Mires, PharmD, MPH, BCPS Clinical Pharmacist (Rheumatology and Pulmonology)

## 2022-12-09 ENCOUNTER — Other Ambulatory Visit: Payer: Self-pay

## 2022-12-09 ENCOUNTER — Encounter (HOSPITAL_COMMUNITY): Payer: Self-pay

## 2022-12-09 ENCOUNTER — Other Ambulatory Visit (HOSPITAL_COMMUNITY): Payer: Self-pay

## 2022-12-13 ENCOUNTER — Other Ambulatory Visit (HOSPITAL_COMMUNITY): Payer: Self-pay

## 2022-12-15 ENCOUNTER — Other Ambulatory Visit: Payer: Self-pay

## 2022-12-16 ENCOUNTER — Other Ambulatory Visit (HOSPITAL_COMMUNITY): Payer: Self-pay

## 2022-12-16 ENCOUNTER — Other Ambulatory Visit: Payer: Self-pay

## 2022-12-23 ENCOUNTER — Telehealth: Payer: Self-pay | Admitting: Internal Medicine

## 2022-12-23 NOTE — Telephone Encounter (Signed)
Please contact patient and schedule ACUTE visit with APP or Young. Thank you.

## 2022-12-23 NOTE — Telephone Encounter (Signed)
Patient came into office saying he has a bad cough and has had it for 3 months. He would like something for it.The dupixent is not helping either. He is also experiencing shortness of breath. He has been to urgent care and they told him to go to the doctor. Please advise he can be reached at [phone].

## 2022-12-24 ENCOUNTER — Ambulatory Visit (INDEPENDENT_AMBULATORY_CARE_PROVIDER_SITE_OTHER): Payer: BC Managed Care – PPO | Admitting: Primary Care

## 2022-12-24 ENCOUNTER — Encounter: Payer: Self-pay | Admitting: Primary Care

## 2022-12-24 ENCOUNTER — Telehealth: Payer: Self-pay | Admitting: Primary Care

## 2022-12-24 VITALS — BP 132/84 | HR 68 | Temp 97.3°F | Ht 75.0 in | Wt 189.0 lb

## 2022-12-24 DIAGNOSIS — J40 Bronchitis, not specified as acute or chronic: Secondary | ICD-10-CM

## 2022-12-24 DIAGNOSIS — J209 Acute bronchitis, unspecified: Secondary | ICD-10-CM | POA: Insufficient documentation

## 2022-12-24 MED ORDER — BREZTRI AEROSPHERE 160-9-4.8 MCG/ACT IN AERO: 2.0000 | INHALATION_SPRAY | Freq: Two times a day (BID) | RESPIRATORY_TRACT | 11 refills | Status: DC

## 2022-12-24 MED ORDER — BENZONATATE 100 MG PO CAPS: 100.0000 mg | ORAL_CAPSULE | Freq: Three times a day (TID) | ORAL | 1 refills | Status: DC | PRN

## 2022-12-24 MED ORDER — MONTELUKAST SODIUM 10 MG PO TABS: 10.0000 mg | ORAL_TABLET | Freq: Every day | ORAL | 11 refills | Status: DC

## 2022-12-24 MED ORDER — PREDNISONE 10 MG PO TABS: ORAL_TABLET | ORAL | 0 refills | Status: DC

## 2022-12-24 MED ORDER — DOXYCYCLINE HYCLATE 100 MG PO TABS: 100.0000 mg | ORAL_TABLET | Freq: Two times a day (BID) | ORAL | 0 refills | Status: DC

## 2022-12-24 NOTE — Telephone Encounter (Signed)
Patient is not currently on Dupixent - he tells me that insurance stopped covering Dupixent from July to December.  His last injection was in July.

## 2022-12-24 NOTE — Patient Instructions (Addendum)
Recommendations: Start prednisone taper and antibiotic as directed Refill Singulair and Breztri  Provided with dupixent samples   Follow-up: 3 months with Dr. Maple Hudson or call if cough does not get better      Acute Bronchitis, Adult  Acute bronchitis is when air tubes in the lungs (bronchi) suddenly get swollen. The condition can make it hard for you to breathe. In adults, acute bronchitis usually goes away within 2 weeks. A cough caused by bronchitis may last up to 3 weeks. Smoking, allergies, and asthma can make the condition worse. What are the causes? Germs that cause cold and flu (viruses). The most common cause of this condition is the virus that causes the common cold. Bacteria. Substances that bother (irritate) the lungs, including: Smoke from cigarettes and other types of tobacco. Dust and pollen. Fumes from chemicals, gases, or burned fuel. Indoor or outdoor air pollution. What increases the risk? A weak body's defense system. This is also called the immune system. Any condition that affects your lungs and breathing, such as asthma. What are the signs or symptoms? A cough. Coughing up clear, yellow, or green mucus. Making high-pitched whistling sounds when you breathe, most often when you breathe out (wheezing). Runny or stuffy nose. Having too much mucus in your lungs (chest congestion). Shortness of breath. Body aches. A sore throat. How is this treated? Acute bronchitis may go away over time without treatment. Your doctor may tell you to: Drink more fluids. This will help thin your mucus so it is easier to cough up. Use a device that gets medicine into your lungs (inhaler). Use a vaporizer or a humidifier. These are machines that add water to the air. This helps with coughing and poor breathing. Take a medicine that thins mucus and helps clear it from your lungs. Take a medicine that prevents or stops coughing. It is not common to take an antibiotic medicine for  this condition. Follow these instructions at home:  Take over-the-counter and prescription medicines only as told by your doctor. Use an inhaler, vaporizer, or humidifier as told by your doctor. Take two teaspoons (10 mL) of honey at bedtime. This helps lessen your coughing at night. Drink enough fluid to keep your pee (urine) pale yellow. Do not smoke or use any products that contain nicotine or tobacco. If you need help quitting, ask your doctor. Get a lot of rest. Return to your normal activities when your doctor says that it is safe. Keep all follow-up visits. How is this prevented?  Wash your hands often with soap and water for at least 20 seconds. If you cannot use soap and water, use hand sanitizer. Avoid contact with people who have cold symptoms. Try not to touch your mouth, nose, or eyes with your hands. Avoid breathing in smoke or chemical fumes. Make sure to get the flu shot every year. Contact a doctor if: Your symptoms do not get better in 2 weeks. You have trouble coughing up the mucus. Your cough keeps you awake at night. You have a fever. Get help right away if: You cough up blood. You have chest pain. You have very bad shortness of breath. You faint or keep feeling like you are going to faint. You have a very bad headache. Your fever or chills get worse. These symptoms may be an emergency. Get help right away. Call your local emergency services (911 in the [place] Do not wait to see if the symptoms will go away. Do not drive yourself to the  hospital. Summary Acute bronchitis is when air tubes in the lungs (bronchi) suddenly get swollen. In adults, acute bronchitis usually goes away within 2 weeks. Drink more fluids. This will help thin your mucus so it is easier to cough up. Take over-the-counter and prescription medicines only as told by your doctor. Contact a doctor if your symptoms do not improve after 2 weeks of treatment. This information is not intended to  replace advice given to you by your health care provider. Make sure you discuss any questions you have with your health care provider. Document Revised: 08/27/2020 Document Reviewed: 08/27/2020 Elsevier Patient Education  2024 [employer].

## 2022-12-24 NOTE — Progress Notes (Signed)
@Patient  ID: Drew Sandoval, male    DOB: 1975-06-01, 47 y.o.   MRN: [id number]  Chief Complaint  Patient presents with   Acute Visit    Sob  Cough  ACT 8     Referring provider: No ref. provider found  HPI: 47 year old male, former smoker quit in 2011.  Past medical history significant for severe persistent asthma, allergic eosinophilia, aspiration pneumonia, seasonal and perennial allergic rhinitis, hypertension, essential tremor, hyper IgE syndrome.  12/24/2022 Patient presents today for acute visit. He has had a productive cough for last couple of months. He reports coughing so much that he will throw up, this has been going on since he was last seen in May by Dr. Maple Hudson. CXR in May showed mild diffuse, chronic appearing increased interstitial lung markings. Shortness of breath is baseline. He has some associated wheezing. He is using [employer] twice a day as directed. Not currently on Dupixent due to insurance, last injection was in July 24th. Needs refill Breztri and Singulair.    Allergies  Allergen Reactions   Azithromycin Rash   Banana Swelling    Throat swelling    Alvesco [Ciclesonide] Rash    Possible reaction- rash   Anoro Ellipta [Umeclidinium-Vilanterol] Rash    Possible- rash   Methylprednisolone Other (See Comments)    unknown   Penicillins Rash    Has patient had a PCN reaction causing immediate rash, facial/tongue/throat swelling, SOB or lightheadedness with hypotension: Yes Has patient had a PCN reaction causing severe rash involving mucus membranes or skin necrosis: No Has patient had a PCN reaction that required hospitalization Yes Has patient had a PCN reaction occurring within the last 10 years: No If all of the above answers are "NO", then may proceed with Cephalosporin use.      Immunization History  Administered Date(s) Administered   Influenza Split 02/18/2011, 02/03/2012   Influenza,inj,Quad PF,6+ Mos 01/22/2013, 07/24/2013,  06/25/2015, 03/11/2016, 01/25/2017, 01/16/2019, 06/17/2020, 05/21/2021   Influenza-Unspecified 02/11/2018   PFIZER(Purple Top)SARS-COV-2 Vaccination 10/04/2019, 10/26/2019   Pneumococcal Polysaccharide-23 03/11/2016    Past Medical History:  Diagnosis Date   Acute conjunctivitis of right eye 10/07/2014   Acute respiratory failure with hypoxia (HCC) 07/23/2013   Asthma    Asthma    Phreesia 06/26/2020   Asthma, severe persistent 07/16/2010   PFT >> spiro 2012 with FEV1 1.14 (28%), ratio 45 Alpha 1 (08/12/10) >>MM ([facility])  IGE 1747, positive allergy profile >> Elevated eos on CBC S/p intubation 10/2010 Job exposure to respiratory irritant - bleach, chicken Allergy vaccine begun 11/09/2010. DC'd by pt 2013- resumed 2013> 1:50 GH; DC'd [facility] 9/24-9/27/ 2013- Status asthma, no vent. [facility] 3/ 2015- status ast   COVID-19 virus infection 06/17/2020   January, 2022. No hosp, no MAB   Eczema, allergic 07/22/2011   Essential tremor 06/27/2020   Hypertension    Seasonal allergies    Seasonal and perennial allergic rhinitis 07/17/2014   FENO 03/18/16- 100  High, indicates allergic asthma   Sinusitis, maxillary, 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   Sinus CT 12/10/2013 >>>    Status asthmaticus 10/19/2010   Sustained SVT 09/24/2014   SVT (supraventricular tachycardia) 09/24/2014    Tobacco History: Social History   Tobacco Use  Smoking Status Former   Current packs/day: 0.00   Average packs/day: 0.5 packs/day for 11.5 years (5.8 ttl pk-yrs)   Types: Cigarettes   Start date: 10/09/1998   Quit date: 04/09/2010   Years since quitting: 12.7  Smokeless Tobacco Former  Tobacco Comments   started at age 51.  1/2 ppd.     Counseling given: Not Answered Tobacco comments: started at age 63.  1/2 ppd.     Outpatient Medications Prior to Visit  Medication Sig Dispense Refill   albuterol (VENTOLIN HFA) 108 (90 Base) MCG/ACT inhaler Inhale 2 puffs into the lungs every 6 (six) hours as  needed for wheezing or shortness of breath. 8 g 2   amLODipine (NORVASC) 5 MG tablet TAKE 1 TABLET (5 MG TOTAL) BY MOUTH DAILY. 15 tablet 0   azelastine (ASTELIN) 0.1 % nasal spray Place 2 sprays into both nostrils 2 (two) times daily. Use in each nostril as directed 30 mL 0   brompheniramine-pseudoephedrine-DM 30-2-10 MG/5ML syrup Take 5 mLs by mouth 4 (four) times daily as needed. 140 mL 0   EPINEPHrine 0.3 mg/0.3 mL IJ SOAJ injection Inject into thigh for severe allergic reaction 1 each prn   levalbuterol (XOPENEX) 0.31 MG/3ML nebulizer solution Take 3 mLs (0.31 mg total) by nebulization every 4 (four) hours as needed for wheezing. 75 mL 12   primidone (MYSOLINE) 50 MG tablet Take 4 tablets (200 mg total) by mouth 2 (two) times daily. 720 tablet 1   revefenacin (YUPELRI) 175 MCG/3ML nebulizer solution Take 3 mLs (175 mcg total) by nebulization daily. 90 mL 0   tiZANidine (ZANAFLEX) 4 MG capsule Take 1 capsule (4 mg total) by mouth 3 (three) times daily as needed for muscle spasms. Do not drink alcohol or drive while taking this medication.  May cause drowsiness. 15 capsule 0   traMADol (ULTRAM) 50 MG tablet 1 every 8 hours if needed for  cough 40 tablet 0   Budeson-Glycopyrrol-Formoterol (BREZTRI AEROSPHERE) 160-9-4.8 MCG/ACT AERO Inhale 2 puffs into the lungs 2 (two) times daily. Rinse mouth 10.7 g 11   montelukast (SINGULAIR) 10 MG tablet Take 1 tablet (10 mg total) by mouth at bedtime. 30 tablet 0   Dupilumab (DUPIXENT) 300 MG/2ML SOPN INJECT 300 MG INTO THE SKIN EVERY 14 DAYS. (Patient not taking: Reported on 12/24/2022) 4 mL 3   predniSONE (DELTASONE) 50 MG tablet Take 1 tablet by mouth daily with breakfast. (Patient not taking: Reported on 12/24/2022) 5 tablet 0   No facility-administered medications prior to visit.   Review of Systems  Review of Systems  Constitutional: Negative.   HENT: Negative.    Respiratory:  Positive for cough and wheezing. Negative for shortness of breath.    Cardiovascular: Negative.    Physical Exam  BP 132/84 (BP Location: Left Arm, Patient Position: Sitting, Cuff Size: Normal)   Pulse 68   Temp (!) 97.3 F (36.3 C) (Temporal)   Ht 6\' 3"  (1.905 m)   Wt 189 lb (85.7 kg)   SpO2 100%   BMI 23.62 kg/m  Physical Exam Constitutional:      Appearance: Normal appearance. He is not ill-appearing.  HENT:     Head: Normocephalic and atraumatic.     Mouth/Throat:     Mouth: Mucous membranes are moist.     Pharynx: Oropharynx is clear.  Cardiovascular:     Rate and Rhythm: Normal rate and regular rhythm.  Pulmonary:     Effort: Pulmonary effort is normal.     Breath sounds: Wheezing present. No rhonchi or rales.  Musculoskeletal:        General: Normal range of motion.  Skin:    General: Skin is warm and dry.  Neurological:     General: No focal deficit present.  Mental Status: He is alert and oriented to person, place, and time. Mental status is at baseline.  Psychiatric:        Mood and Affect: Mood normal.        Behavior: Behavior normal.        Thought Content: Thought content normal.        Judgment: Judgment normal.      Lab Results:  CBC    Component Value Date/Time   WBC 5.4 09/23/2022 2300   RBC 4.38 09/23/2022 2300   HGB 13.5 09/23/2022 2300   HCT 40.3 09/23/2022 2300   PLT 249 09/23/2022 2300   MCV 92.0 09/23/2022 2300   MCH 30.8 09/23/2022 2300   MCHC 33.5 09/23/2022 2300   RDW 14.0 09/23/2022 2300   LYMPHSABS 1.0 09/23/2022 2300   MONOABS 0.1 09/23/2022 2300   EOSABS 0.8 (H) 09/23/2022 2300   BASOSABS 0.0 09/23/2022 2300    BMET    Component Value Date/Time   NA 138 09/23/2022 2300   K 2.8 (L) 09/23/2022 2300   CL 106 09/23/2022 2300   CO2 24 09/23/2022 2300   GLUCOSE 119 (H) 09/23/2022 2300   BUN 13 09/23/2022 2300   CREATININE 0.86 09/23/2022 2300   CALCIUM 8.5 (L) 09/23/2022 2300   GFRNONAA >60 09/23/2022 2300   GFRAA >60 09/04/2019 1357    BNP    Component Value Date/Time   BNP  23.5 09/24/2014 0943    ProBNP No results found for: "PROBNP"  Imaging: No results found.   Assessment & Plan:   Asthma, severe persistent - Currently exacerbated due to acute bronchitis. ACT score 8. He is compliant with [employer] twice daily and Singulair. He is overdue for Dupixent injection, insurance has stopped covering from July-January/ he was getting money on a copay card but this ran out. Spoke with our pharmacy team and they will reach out to drug rep about next steps. He was given 1 month of Dupixent samples today. Rx prednisone taper 40mg  x 2 days, 30 x 2 days, 20 mg x 2 days, 10 mg sent to these.  Refill sent for Breztri and Singulair.  Acute bronchitis - Productive cough with associated wheezing symptoms and shortness of breath - Rx Doxycycline 100mg  BID x 10 days and tessalon perles TID prn cough     Glenford Bayley, NP 12/24/2022

## 2022-12-24 NOTE — Assessment & Plan Note (Addendum)
-   Productive cough with associated wheezing symptoms and shortness of breath - Rx Doxycycline 100mg  BID x 10 days and tessalon perles TID prn cough

## 2022-12-24 NOTE — Assessment & Plan Note (Addendum)
-   Currently exacerbated due to acute bronchitis. ACT score 8. He is compliant with [employer] twice daily and Singulair. He is overdue for Dupixent injection, insurance has stopped covering from July-January/ he was getting money on a copay card but this ran out. Spoke with our pharmacy team and they will reach out to drug rep about next steps. He was given 1 month of Dupixent samples today. Rx prednisone taper 40mg  x 2 days, 30 x 2 days, 20 mg x 2 days, 10 mg sent to these.  Refill sent for Breztri and Singulair.

## 2022-12-24 NOTE — Telephone Encounter (Addendum)
Patient will need to call Dupixent's copay card company for additional fund support if they can provide. This is recurrent issue for patient and he shoiuyld be familiar with process. Unfortunately any specialty medication (biologic) is going to encounter this problem because it is an inherent insurance structuring problem.   If no luck with copay card company, patient will need samples for remainder of calendar year  Chesley Mires, PharmD, MPH, BCPS, CPP Clinical Pharmacist (Rheumatology and Pulmonology)

## 2023-01-04 ENCOUNTER — Other Ambulatory Visit (HOSPITAL_COMMUNITY): Payer: Self-pay

## 2023-01-04 ENCOUNTER — Encounter: Payer: Self-pay | Admitting: Neurology

## 2023-01-13 ENCOUNTER — Ambulatory Visit: Payer: BC Managed Care – PPO | Admitting: Internal Medicine

## 2023-01-26 ENCOUNTER — Other Ambulatory Visit: Payer: Self-pay | Admitting: Neurology

## 2023-01-26 DIAGNOSIS — G250 Essential tremor: Secondary | ICD-10-CM

## 2023-02-01 NOTE — Progress Notes (Unsigned)
Assessment/Plan:    1.  Essential Tremor, exacerbated by medication for asthma  -feels better after decreasing caffeine  -I had intended him to take primidone, 4 tablets in the morning, 4 tablets at night but he is taking 3 in the AM/4 at bed and feels like doing okay right now  -when he ran out of primidone tremor was much worse  -understands that tremor likely will never be fully under control due to other medications that induce tremor, including prednisone currently   -cardiology started him on low dose metoprolol for SVT.  It is probably too low dose to help tremor but given severity of asthma, wouldn't want to increase BB even if able.  -Discussed again with the patient that there really is not much more medical wise that we can do for his tremor.  His fiancee came for the first time today and we again discussed dbs vs focused ultrasound in detail.  He states today he is thinking about it.  The L hand is much worse than the right and he would just need unilateral R VIM surgery (he is L handed).     2.  Severe Asthma  -Medications for this contribute to tremor  -reports that his insurance only covers his asthma meds through July and then he has to take prednisone July - end of year, which makes #1 more difficult  3.  SVT  -Following with cardiology.  4.  F/u 6-9 months  Subjective:   Drew Sandoval was seen today in follow up for essential tremor, which is much exacerbated by medication for his asthma. Pt with fiancee who supplements hx.  He is taking the primidone.  Has decreased his caffeine use and found that to be helpful.  He was supposed to increase his dose to 4in the AM and PM but he didn't.  He works 3rd shift.  He was out of his meds for a few days and "I couldn't write a thing"     Current prescribed movement disorder medications: Primidone, 50 mg, 4 tablets twice per day   PREVIOUS MEDICATIONS: primidone; artane (thought caused hand/feet itching); Topamax (not  tried because of nephrolithiasis); Cogentin (discontinued because of sexual side effects; gabapentin (not helpful)  ALLERGIES:   Allergies  Allergen Reactions   Azithromycin Rash   Banana Swelling    Throat swelling    Alvesco [Ciclesonide] Rash    Possible reaction- rash   Anoro Ellipta [Umeclidinium-Vilanterol] Rash    Possible- rash   Methylprednisolone Other (See Comments)    unknown   Penicillins Rash    Has patient had a PCN reaction causing immediate rash, facial/tongue/throat swelling, SOB or lightheadedness with hypotension: Yes Has patient had a PCN reaction causing severe rash involving mucus membranes or skin necrosis: No Has patient had a PCN reaction that required hospitalization Yes Has patient had a PCN reaction occurring within the last 10 years: No If all of the above answers are "NO", then may proceed with Cephalosporin use.      CURRENT MEDICATIONS:  Outpatient Encounter Medications as of 02/02/2023  Medication Sig   albuterol (VENTOLIN HFA) 108 (90 Base) MCG/ACT inhaler Inhale 2 puffs into the lungs every 6 (six) hours as needed for wheezing or shortness of breath.   amLODipine (NORVASC) 5 MG tablet TAKE 1 TABLET (5 MG TOTAL) BY MOUTH DAILY.   benzonatate (TESSALON) 100 MG capsule Take 1 capsule (100 mg total) by mouth 3 (three) times daily as needed for cough.  levalbuterol (XOPENEX) 0.31 MG/3ML nebulizer solution Take 3 mLs (0.31 mg total) by nebulization every 4 (four) hours as needed for wheezing.   montelukast (SINGULAIR) 10 MG tablet Take 1 tablet (10 mg total) by mouth at bedtime.   predniSONE (DELTASONE) 10 MG tablet 4 tabs for 2 days, then 3 tabs for 2 days, 2 tabs for 2 days, then 1 tab for 2 days, then stop   primidone (MYSOLINE) 50 MG tablet TAKE 4 TABLETS (200 MG TOTAL) BY MOUTH 2 (TWO) TIMES DAILY.   revefenacin (YUPELRI) 175 MCG/3ML nebulizer solution Take 3 mLs (175 mcg total) by nebulization daily.   tiZANidine (ZANAFLEX) 4 MG capsule Take 1  capsule (4 mg total) by mouth 3 (three) times daily as needed for muscle spasms. Do not drink alcohol or drive while taking this medication.  May cause drowsiness.   traMADol (ULTRAM) 50 MG tablet 1 every 8 hours if needed for  cough   brompheniramine-pseudoephedrine-DM 30-2-10 MG/5ML syrup Take 5 mLs by mouth 4 (four) times daily as needed. (Patient not taking: Reported on 02/02/2023)   Budeson-Glycopyrrol-Formoterol (BREZTRI AEROSPHERE) 160-9-4.8 MCG/ACT AERO Inhale 2 puffs into the lungs 2 (two) times daily. Rinse mouth   Dupilumab (DUPIXENT) 300 MG/2ML SOPN INJECT 300 MG INTO THE SKIN EVERY 14 DAYS. (Patient not taking: Reported on 12/24/2022)   EPINEPHrine 0.3 mg/0.3 mL IJ SOAJ injection Inject into thigh for severe allergic reaction   [DISCONTINUED] azelastine (ASTELIN) 0.1 % nasal spray Place 2 sprays into both nostrils 2 (two) times daily. Use in each nostril as directed (Patient not taking: Reported on 02/02/2023)   [DISCONTINUED] doxycycline (VIBRA-TABS) 100 MG tablet Take 1 tablet (100 mg total) by mouth 2 (two) times daily. (Patient not taking: Reported on 02/02/2023)   No facility-administered encounter medications on file as of 02/02/2023.     Objective:    PHYSICAL EXAMINATION:    VITALS:   Vitals:   02/02/23 1242  BP: 122/84  Pulse: 81  SpO2: 96%  Weight: 186 lb 12.8 oz (84.7 kg)  Height: 6\' 3"  (1.905 m)     GEN:  The patient appears stated age and is in NAD. HEENT:  Normocephalic, atraumatic.  The mucous membranes are moist.   Neurological examination:  Orientation: The patient is alert and oriented x3. Cranial nerves: There is good facial symmetry. The speech is fluent and clear. Soft palate rises symmetrically and there is no tongue deviation. Hearing is intact to conversational tone. Sensation: Sensation is intact to light touch throughout Motor: Strength is at least antigravity x4.  Movement examination: Tone: There is normal tone in the UE/LE Abnormal  movements: L>>>R intention tremor.  This is stable c/t previous.  Has trouble with archimedes spirals on the L  Coordination:  There is no decremation with RAM's Gait and Station: The patient has no difficulty arising out of a deep-seated chair without the use of the hands. The patient's stride length is good I have reviewed and interpreted the following labs independently   Chemistry      Component Value Date/Time   NA 138 09/23/2022 2300   K 2.8 (L) 09/23/2022 2300   CL 106 09/23/2022 2300   CO2 24 09/23/2022 2300   BUN 13 09/23/2022 2300   CREATININE 0.86 09/23/2022 2300      Component Value Date/Time   CALCIUM 8.5 (L) 09/23/2022 2300   ALKPHOS 62 06/13/2021 0021   AST 17 06/13/2021 0021   ALT 15 06/13/2021 0021   BILITOT 0.5 06/13/2021 0021  Lab Results  Component Value Date   WBC 5.4 09/23/2022   HGB 13.5 09/23/2022   HCT 40.3 09/23/2022   MCV 92.0 09/23/2022   PLT 249 09/23/2022   Lab Results  Component Value Date   TSH 0.47 10/08/2021     Chemistry      Component Value Date/Time   NA 138 09/23/2022 2300   K 2.8 (L) 09/23/2022 2300   CL 106 09/23/2022 2300   CO2 24 09/23/2022 2300   BUN 13 09/23/2022 2300   CREATININE 0.86 09/23/2022 2300      Component Value Date/Time   CALCIUM 8.5 (L) 09/23/2022 2300   ALKPHOS 62 06/13/2021 0021   AST 17 06/13/2021 0021   ALT 15 06/13/2021 0021   BILITOT 0.5 06/13/2021 0021         Cc:  Pcp, No

## 2023-02-02 ENCOUNTER — Encounter: Payer: Self-pay | Admitting: Neurology

## 2023-02-02 ENCOUNTER — Ambulatory Visit: Payer: BC Managed Care – PPO | Admitting: Neurology

## 2023-02-02 DIAGNOSIS — G250 Essential tremor: Secondary | ICD-10-CM

## 2023-02-02 MED ORDER — PRIMIDONE 50 MG PO TABS
ORAL_TABLET | ORAL | 2 refills | Status: DC
Start: 2023-02-02 — End: 2023-03-07

## 2023-02-03 ENCOUNTER — Ambulatory Visit: Payer: BC Managed Care – PPO | Admitting: Neurology

## 2023-02-04 ENCOUNTER — Ambulatory Visit: Payer: BC Managed Care – PPO

## 2023-02-04 ENCOUNTER — Ambulatory Visit
Admission: RE | Admit: 2023-02-04 | Discharge: 2023-02-04 | Disposition: A | Discharge status: Home / Self Care | Payer: BC Managed Care – PPO | Source: Ambulatory Visit | Attending: Family Medicine

## 2023-02-04 ENCOUNTER — Other Ambulatory Visit: Payer: Self-pay

## 2023-02-04 ENCOUNTER — Emergency Department (HOSPITAL_COMMUNITY): Payer: BC Managed Care – PPO

## 2023-02-04 ENCOUNTER — Observation Stay (HOSPITAL_COMMUNITY)
Admission: EM | Admit: 2023-02-04 | Discharge: 2023-02-05 | Disposition: A | Discharge status: Home / Self Care | Payer: BC Managed Care – PPO | Attending: Student | Admitting: Student

## 2023-02-04 ENCOUNTER — Encounter (HOSPITAL_COMMUNITY): Payer: Self-pay | Admitting: *Deleted

## 2023-02-04 VITALS — BP 136/82 | HR 91 | Temp 98.4°F | Resp 22

## 2023-02-04 DIAGNOSIS — Z87891 Personal history of nicotine dependence: Secondary | ICD-10-CM | POA: Diagnosis not present

## 2023-02-04 DIAGNOSIS — J4550 Severe persistent asthma, uncomplicated: Secondary | ICD-10-CM | POA: Diagnosis present

## 2023-02-04 DIAGNOSIS — J209 Acute bronchitis, unspecified: Secondary | ICD-10-CM | POA: Diagnosis not present

## 2023-02-04 DIAGNOSIS — R918 Other nonspecific abnormal finding of lung field: Secondary | ICD-10-CM | POA: Diagnosis not present

## 2023-02-04 DIAGNOSIS — R0902 Hypoxemia: Secondary | ICD-10-CM

## 2023-02-04 DIAGNOSIS — J45901 Unspecified asthma with (acute) exacerbation: Principal | ICD-10-CM | POA: Diagnosis present

## 2023-02-04 DIAGNOSIS — Z8616 Personal history of COVID-19: Secondary | ICD-10-CM | POA: Insufficient documentation

## 2023-02-04 DIAGNOSIS — R0789 Other chest pain: Secondary | ICD-10-CM | POA: Diagnosis not present

## 2023-02-04 DIAGNOSIS — Z79899 Other long term (current) drug therapy: Secondary | ICD-10-CM | POA: Insufficient documentation

## 2023-02-04 DIAGNOSIS — G250 Essential tremor: Secondary | ICD-10-CM | POA: Diagnosis not present

## 2023-02-04 DIAGNOSIS — Z1152 Encounter for screening for COVID-19: Secondary | ICD-10-CM | POA: Diagnosis not present

## 2023-02-04 DIAGNOSIS — I471 Supraventricular tachycardia, unspecified: Secondary | ICD-10-CM | POA: Diagnosis present

## 2023-02-04 DIAGNOSIS — R058 Other specified cough: Secondary | ICD-10-CM | POA: Diagnosis not present

## 2023-02-04 DIAGNOSIS — D824 Hyperimmunoglobulin E [IgE] syndrome: Secondary | ICD-10-CM | POA: Diagnosis not present

## 2023-02-04 DIAGNOSIS — J4551 Severe persistent asthma with (acute) exacerbation: Secondary | ICD-10-CM | POA: Diagnosis not present

## 2023-02-04 DIAGNOSIS — R0602 Shortness of breath: Secondary | ICD-10-CM

## 2023-02-04 DIAGNOSIS — R079 Chest pain, unspecified: Secondary | ICD-10-CM | POA: Diagnosis not present

## 2023-02-04 DIAGNOSIS — J449 Chronic obstructive pulmonary disease, unspecified: Secondary | ICD-10-CM | POA: Diagnosis not present

## 2023-02-04 DIAGNOSIS — I10 Essential (primary) hypertension: Secondary | ICD-10-CM | POA: Diagnosis not present

## 2023-02-04 LAB — BASIC METABOLIC PANEL
Anion gap: 7 (ref 5–15)
BUN: 15 mg/dL (ref 6–20)
CO2: 25 mmol/L (ref 22–32)
Calcium: 8.6 mg/dL — ABNORMAL LOW (ref 8.9–10.3)
Chloride: 105 mmol/L (ref 98–111)
Creatinine, Ser: 0.84 mg/dL (ref 0.61–1.24)
GFR, Estimated: >60 mL/min (ref >60)
Glucose, Bld: 89 mg/dL (ref 70–99)
Potassium: 3.9 mmol/L (ref 3.5–5.1)
Sodium: 137 mmol/L (ref 135–145)

## 2023-02-04 LAB — CBC
HCT: 41 % (ref 39.0–52.0)
Hemoglobin: 13.6 g/dL (ref 13.0–17.0)
MCH: 30.7 pg (ref 26.0–34.0)
MCHC: 33.2 g/dL (ref 30.0–36.0)
MCV: 92.6 fL (ref 80.0–100.0)
Platelets: 274 10*3/uL (ref 150–400)
RBC: 4.43 MIL/uL (ref 4.22–5.81)
RDW: 14 % (ref 11.5–15.5)
WBC: 7.1 10*3/uL (ref 4.0–10.5)
nRBC: 0 % (ref 0.0–0.2)

## 2023-02-04 LAB — SARS CORONAVIRUS 2 BY RT PCR: SARS Coronavirus 2 by RT PCR: NEGATIVE

## 2023-02-04 LAB — PROCALCITONIN: Procalcitonin: <0.1 ng/mL

## 2023-02-04 LAB — HIV ANTIBODY (ROUTINE TESTING W REFLEX): HIV Screen 4th Generation wRfx: NONREACTIVE

## 2023-02-04 LAB — D-DIMER, QUANTITATIVE: D-Dimer, Quant: 0.79 ug{FEU}/mL — ABNORMAL HIGH (ref 0.00–0.50)

## 2023-02-04 LAB — TROPONIN I (HIGH SENSITIVITY)
Troponin I (High Sensitivity): <2 ng/L (ref <18)
Troponin I (High Sensitivity): <2 ng/L (ref <18)

## 2023-02-04 MED ORDER — PNEUMOCOCCAL 20-VAL CONJ VACC 0.5 ML IM SUSY: 0.5000 mL | PREFILLED_SYRINGE | INTRAMUSCULAR | Status: DC

## 2023-02-04 MED ORDER — SENNOSIDES-DOCUSATE SODIUM 8.6-50 MG PO TABS: 1.0000 | ORAL_TABLET | Freq: Two times a day (BID) | ORAL | Status: DC | PRN

## 2023-02-04 MED ORDER — PREDNISONE 20 MG PO TABS
50.0000 mg | ORAL_TABLET | Freq: Every day | ORAL | Status: DC
  Administered 2023-02-05: 50 mg via ORAL
  Filled 2023-02-04: qty 3

## 2023-02-04 MED ORDER — ENOXAPARIN SODIUM 40 MG/0.4ML IJ SOSY
40.0000 mg | PREFILLED_SYRINGE | INTRAMUSCULAR | Status: DC
  Administered 2023-02-04: 40 mg via SUBCUTANEOUS
  Filled 2023-02-04: qty 0.4

## 2023-02-04 MED ORDER — BUDESONIDE 0.5 MG/2ML IN SUSP
0.5000 mg | Freq: Two times a day (BID) | RESPIRATORY_TRACT | Status: DC
  Administered 2023-02-04 – 2023-02-05 (×2): 0.5 mg via RESPIRATORY_TRACT
  Filled 2023-02-04 (×2): qty 2

## 2023-02-04 MED ORDER — PREDNISONE 50 MG PO TABS
60.0000 mg | ORAL_TABLET | Freq: Once | ORAL | Status: AC
  Administered 2023-02-04: 60 mg via ORAL

## 2023-02-04 MED ORDER — DILTIAZEM HCL 30 MG PO TABS: 30.0000 mg | ORAL_TABLET | Freq: Four times a day (QID) | ORAL | Status: DC | PRN

## 2023-02-04 MED ORDER — ALBUTEROL SULFATE (2.5 MG/3ML) 0.083% IN NEBU
10.0000 mg/h | INHALATION_SOLUTION | Freq: Once | RESPIRATORY_TRACT | Status: AC
  Administered 2023-02-04: 10 mg/h via RESPIRATORY_TRACT
  Filled 2023-02-04: qty 3

## 2023-02-04 MED ORDER — IPRATROPIUM BROMIDE 0.02 % IN SOLN
1.0000 mg | Freq: Once | RESPIRATORY_TRACT | Status: AC
  Administered 2023-02-04: 1 mg via RESPIRATORY_TRACT
  Filled 2023-02-04: qty 5

## 2023-02-04 MED ORDER — INFLUENZA VIRUS VACC SPLIT PF (FLUZONE) 0.5 ML IM SUSY: 0.5000 mL | PREFILLED_SYRINGE | INTRAMUSCULAR | Status: DC

## 2023-02-04 MED ORDER — ALBUTEROL SULFATE (2.5 MG/3ML) 0.083% IN NEBU
10.0000 mg/h | INHALATION_SOLUTION | Freq: Once | RESPIRATORY_TRACT | Status: AC
  Administered 2023-02-04: 10 mg/h via RESPIRATORY_TRACT
  Filled 2023-02-04 (×2): qty 3

## 2023-02-04 MED ORDER — MAGNESIUM SULFATE 2 GM/50ML IV SOLN
2.0000 g | Freq: Once | INTRAVENOUS | Status: AC
  Administered 2023-02-04: 2 g via INTRAVENOUS
  Filled 2023-02-04: qty 50

## 2023-02-04 MED ORDER — ALBUTEROL SULFATE (2.5 MG/3ML) 0.083% IN NEBU
2.5000 mg | INHALATION_SOLUTION | Freq: Once | RESPIRATORY_TRACT | Status: AC
  Administered 2023-02-04: 2.5 mg via RESPIRATORY_TRACT

## 2023-02-04 MED ORDER — ONDANSETRON HCL 4 MG/2ML IJ SOLN: 4.0000 mg | Freq: Four times a day (QID) | INTRAMUSCULAR | Status: DC | PRN

## 2023-02-04 MED ORDER — LEVALBUTEROL HCL 0.63 MG/3ML IN NEBU
0.6300 mg | INHALATION_SOLUTION | RESPIRATORY_TRACT | Status: DC | PRN
  Administered 2023-02-04 – 2023-02-05 (×2): 0.63 mg via RESPIRATORY_TRACT
  Filled 2023-02-04: qty 3

## 2023-02-04 MED ORDER — ONDANSETRON HCL 4 MG PO TABS: 4.0000 mg | ORAL_TABLET | Freq: Four times a day (QID) | ORAL | Status: DC | PRN

## 2023-02-04 MED ORDER — ACETAMINOPHEN 325 MG PO TABS
650.0000 mg | ORAL_TABLET | Freq: Four times a day (QID) | ORAL | Status: DC | PRN
  Administered 2023-02-05: 650 mg via ORAL
  Filled 2023-02-04: qty 2

## 2023-02-04 MED ORDER — POLYETHYLENE GLYCOL 3350 17 G PO PACK: 17.0000 g | PACK | Freq: Two times a day (BID) | ORAL | Status: DC | PRN

## 2023-02-04 MED ORDER — ACETAMINOPHEN 650 MG RE SUPP: 650.0000 mg | Freq: Four times a day (QID) | RECTAL | Status: DC | PRN

## 2023-02-04 MED ORDER — LEVALBUTEROL HCL 0.63 MG/3ML IN NEBU
0.6300 mg | INHALATION_SOLUTION | Freq: Three times a day (TID) | RESPIRATORY_TRACT | Status: DC
  Administered 2023-02-04 – 2023-02-05 (×2): 0.63 mg via RESPIRATORY_TRACT
  Filled 2023-02-04 (×3): qty 3

## 2023-02-04 NOTE — ED Triage Notes (Addendum)
Pt with left chest pain since Monday. Pt with hx of asthma.  Pt seen at [facility] and sent here due to pt's RA sats decrease with ambulation.  Pt's RA sats in triage 91-92%.  Productive cough of thick sputum.  Reported pt was given 60 mg prednisone.

## 2023-02-04 NOTE — Discharge Instructions (Signed)
Go directly to the emergency department for further management as you are unable to maintain your oxygen saturations at a safe level at this time

## 2023-02-04 NOTE — H&P (Signed)
History and Physical    Patient: Drew Sandoval ION:[id number] DOB: [birth year] DOA: 02/04/2023 DOS: the patient was seen and examined on 02/04/2023 PCP: Pcp, No  Patient coming from: Home  Chief Complaint:  Chief Complaint  Patient presents with   Chest Pain   HPI: Drew Sandoval is a 47 y.o. male with PMH of severe persistent asthma, hyper IgE syndrome, SVT s/p ablation, tremor and HTN presenting with left-sided chest pain.  Patient reports left-sided chest pain, shortness of breath, productive cough with whitish phlegm and wheezing for about 4 days.  He says he has seen a doctor on Monday and treated with doxycycline for bronchitis without improvement.  He went to urgent care last night.  He was tachypneic to 20s and desaturated to 86% on RA after breathing treatment and prednisone, and sent to ED for further evaluation.  Chest x-ray with hyperinflation and bronchitis.  Patient denies fever, runny nose, sore throat, leg swelling, calf tenderness, recent long travel or immobility.  Reports good compliance with his inhalers including Breztri.  Has not been able to get his Dupixent due to insurance.  He says his insurance pays only for the first 7 months of the year.  He denies smoking cigarette, drinking alcohol recreational drug use.  In ED, mild temp to 99.  RR ranges from 12-30.  Desaturated to 88% on room air and put on 2 L.  BMP and CBC basically normal.  Serial troponin negative.  COVID-19 PCR negative.  CXR sinus rhythm in 90s with early repolarization.  Received albuterol nebulizers x 2 and Atrovent x 1, magnesium sulfate, and hospital service called for admission. Review of Systems: As mentioned in the history of present illness. All other systems reviewed and are negative. Past Medical History:  Diagnosis Date   Acute conjunctivitis of right eye 10/07/2014   Acute respiratory failure with hypoxia (HCC) 07/23/2013   Asthma    Asthma    Phreesia 06/26/2020   Asthma, severe  persistent 07/16/2010   PFT >> spiro 2012 with FEV1 1.14 (28%), ratio 45 Alpha 1 (08/12/10) >>MM ([employer] [facility])  IGE 1747, positive allergy profile >> Elevated eos on CBC S/p intubation 10/2010 Job exposure to respiratory irritant - bleach, chicken Allergy vaccine begun 11/09/2010. DC'd by pt 2013- resumed 2013> 1:50 GH; DC'd [facility] 9/24-9/27/ 2013- Status asthma, no vent. [facility] 2015- status ast   COVID-19 virus infection 06/17/2020   January, 2022. No hosp, no MAB   Eczema, allergic 07/22/2011   Essential tremor 06/27/2020   Hypertension    Seasonal allergies    Seasonal and perennial allergic rhinitis 07/17/2014   FENO 03/18/16- 100  High, indicates allergic asthma   Sinusitis, maxillary, 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   Sinus CT 12/10/2013 >>>    Status asthmaticus 10/19/2010   Sustained SVT 09/24/2014   SVT (supraventricular tachycardia) 09/24/2014   Past Surgical History:  Procedure Laterality Date   None     SVT ABLATION N/A 09/07/2019   Procedure: SVT ABLATION;  Surgeon: Marinus Maw, MD;  Location: [facility];  Service: Cardiovascular;  Laterality: N/A;   Social History:  reports that he quit smoking about 12 years ago. His smoking use included cigarettes. He started smoking about 24 years ago. He has a 5.8 pack-year smoking history. He has quit using smokeless tobacco. He reports current alcohol use. He reports that he does not use drugs.  Allergies  Allergen Reactions   Azithromycin Rash   Banana Swelling  Throat swelling    Alvesco [Ciclesonide] Rash    Possible reaction- rash   Anoro Ellipta [Umeclidinium-Vilanterol] Rash    Possible- rash   Methylprednisolone Other (See Comments)    unknown   Penicillins Rash    Family History  Problem Relation Age of Onset   Lung cancer Mother    Emphysema Father    Arthritis Sister    Diabetes Maternal Grandmother    Thyroid disease Neg Hx     Prior to Admission medications   Medication Sig Start  Date End Date Taking? Authorizing Provider  albuterol (VENTOLIN HFA) 108 (90 Base) MCG/ACT inhaler Inhale 2 puffs into the lungs every 6 (six) hours as needed for wheezing or shortness of breath. 11/22/22   Leath-Warren, Sadie Haber, NP  amLODipine (NORVASC) 5 MG tablet TAKE 1 TABLET (5 MG TOTAL) BY MOUTH DAILY. 02/12/22   Marinus Maw, MD  benzonatate (TESSALON) 100 MG capsule Take 1 capsule (100 mg total) by mouth 3 (three) times daily as needed for cough. 12/24/22   Glenford Bayley, NP  brompheniramine-pseudoephedrine-DM 30-2-10 MG/5ML syrup Take 5 mLs by mouth 4 (four) times daily as needed. Patient not taking: Reported on 02/02/2023 11/22/22   Leath-Warren, Sadie Haber, NP  Budeson-Glycopyrrol-Formoterol (BREZTRI AEROSPHERE) 160-9-4.8 MCG/ACT AERO Inhale 2 puffs into the lungs 2 (two) times daily. Rinse mouth 12/24/22   Glenford Bayley, NP  Dupilumab (DUPIXENT) 300 MG/2ML SOPN INJECT 300 MG INTO THE SKIN EVERY 14 DAYS. Patient not taking: Reported on 12/24/2022 12/08/22   Jetty Duhamel D, MD  EPINEPHrine 0.3 mg/0.3 mL IJ SOAJ injection Inject into thigh for severe allergic reaction 09/28/22   Jetty Duhamel D, MD  levalbuterol (XOPENEX) 0.31 MG/3ML nebulizer solution Take 3 mLs (0.31 mg total) by nebulization every 4 (four) hours as needed for wheezing. 09/24/22   Kommor, Madison, MD  montelukast (SINGULAIR) 10 MG tablet Take 1 tablet (10 mg total) by mouth at bedtime. 12/24/22   Glenford Bayley, NP  predniSONE (DELTASONE) 10 MG tablet 4 tabs for 2 days, then 3 tabs for 2 days, 2 tabs for 2 days, then 1 tab for 2 days, then stop 12/24/22   Glenford Bayley, NP  primidone (MYSOLINE) 50 MG tablet 3 in the, 4 in the PM 02/02/23   Tat, Rebecca S, DO  revefenacin (YUPELRI) 175 MCG/3ML nebulizer solution Take 3 mLs (175 mcg total) by nebulization daily. 09/28/22   [name], MD  tiZANidine (ZANAFLEX) 4 MG capsule Take 1 capsule (4 mg total) by mouth 3 (three) times daily as needed for muscle  spasms. Do not drink alcohol or drive while taking this medication.  May cause drowsiness. 05/31/22   Particia Nearing, PA-C  traMADol Janean Sark) 50 MG tablet 1 every 8 hours if needed for  cough 09/28/22   Jetty Duhamel D, MD    Physical Exam: Vitals:   02/04/23 1517 02/04/23 1523 02/04/23 1524 02/04/23 1540  BP:    130/78  Pulse:    86  Resp: (!) 25 (!) 24  20  Temp:   98.1 F (36.7 C) 98.7 F (37.1 C)  TempSrc:   Oral Oral  SpO2:    96%  Weight:      Height:       GENERAL: No apparent distress.  Nontoxic. HEENT: MMM.  Vision and hearing grossly intact.  NECK: Supple.  No apparent JVD.  RESP:  No IWOB.  Diminished aeration bilaterally.  Rhonchi. CVS:  RRR. Heart sounds normal.  ABD/GI/GU: BS+. Abd soft, NTND.  MSK/EXT:   No apparent deformity. Moves extremities. No edema.  SKIN: no apparent skin lesion or wound NEURO: Awake and alert. Oriented appropriately.  No apparent focal neuro deficit. PSYCH: Calm. Normal affect.  Data Reviewed: See HPI  Assessment and Plan: Principal Problem:   Acute asthma exacerbation Active Problems:   Asthma, severe persistent   Hyper-IgE syndrome (HCC)   SVT (supraventricular tachycardia)   Hypertension, essential   Essential tremor   Acute bronchitis  Severe persistent asthma with acute exacerbation Hyper IgE syndrome-per patient, insurance pays for his Dupixent only for the first 7 months of the year Acute bronchitis-recently treated with p.o. doxycycline.  Allergic to Zithromax. Acute respiratory failure with hypoxia due to the above-desaturated to 88% on RA -Presents with progressive shortness of breath, wheezing, productive cough and left-sided chest pain -Exam with diminished aeration, rhonchi.  No work of breathing -Continue p.o. prednisone.  Allergic to Solu-Medrol. -Xopenex every 8 hours scheduled -Xopenex every 2 hours as needed -Budesonide nebulizer 0.5 mg twice daily -Check D-dimer, full RVP and procalcitonin  History  of SVT s/p ablation -P.o. Cardizem as needed  Essential hypertension: Seems to be on amlodipine at home. -P.o. Cardizem as above  Essential tremor -Continue home meds after med rec        Advance Care Planning:   Code Status: Full Code   Consults: None  Family Communication: None at bedside  Severity of Illness: The appropriate patient status for this patient is OBSERVATION. Observation status is judged to be reasonable and necessary in order to provide the required intensity of service to ensure the patient's safety. The patient's presenting symptoms, physical exam findings, and initial radiographic and laboratory data in the context of their medical condition is felt to place them at decreased risk for further clinical deterioration. Furthermore, it is anticipated that the patient will be medically stable for discharge from the hospital within 2 midnights of admission.   Author: Almon Hercules, MD 02/04/2023 3:53 PM  For on call review [email or web address].

## 2023-02-04 NOTE — ED Notes (Signed)
Edp had taken pt off 02 a few minutes ago. Pt stayed at 89%ra while starting iv, pt was placed back on 02 2L  and edp aware

## 2023-02-04 NOTE — ED Provider Notes (Signed)
[facility]    CSN: [id number] Arrival date & time: 02/04/23  1032      History   Chief Complaint Chief Complaint  Patient presents with   Cough    Asthma, headache chest hurting on my left side congestion - Entered by patient    HPI Drew Sandoval is a 47 y.o. male.   Patient presenting today with 4-day history of progressively worsening cough, shortness of breath, chest tightness, wheezing.  Denies sore throat, congestion, fever, chills, body aches, abdominal pain, nausea vomiting or diarrhea.  Has a history of severe asthma on Dupixent, Singulair, Xopenex, Breztri, neb treatments and when asked endorses multiple hospitalizations for his asthma in the past.  Last breathing treatment was last night per patient.    Past Medical History:  Diagnosis Date   Acute conjunctivitis of right eye 10/07/2014   Acute respiratory failure with hypoxia (HCC) 07/23/2013   Asthma    Asthma    Phreesia 06/26/2020   Asthma, severe persistent 07/16/2010   PFT >> spiro 2012 with FEV1 1.14 (28%), ratio 45 Alpha 1 (08/12/10) >>MM ([facility])  IGE 1747, positive allergy profile >> Elevated eos on CBC S/p intubation 10/2010 Job exposure to respiratory irritant - bleach, chicken Allergy vaccine begun 11/09/2010. DC'd by pt 2013- resumed 2013> 1:50 GH; DC'd [facility] 9/24-9/27/ 2013- Status asthma, no vent. [facility]- status ast   COVID-19 virus infection 06/17/2020   January, 2022. No hosp, no MAB   Eczema, allergic 07/22/2011   Essential tremor 06/27/2020   Hypertension    Seasonal allergies    Seasonal and perennial allergic rhinitis 07/17/2014   FENO 03/18/16- 100  High, indicates allergic asthma   Sinusitis, maxillary, 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   Sinus CT 12/10/2013 >>>    Status asthmaticus 10/19/2010   Sustained SVT 09/24/2014   SVT (supraventricular tachycardia) 09/24/2014    Patient Active Problem List   Diagnosis Date Noted   Acute bronchitis 12/24/2022    Aspiration pneumonia (HCC) 08/26/2021   Hematuria 06/11/2021   Periumbilical abdominal pain 06/11/2021   Thyroid nodule 11/18/2020   Essential tremor 06/27/2020   Hypertension, essential 06/17/2020   Gynecomastia, male 04/24/2018   Sustained SVT (HCC) 09/24/2014   SVT (supraventricular tachycardia) (HCC) 09/24/2014   Malnutrition of moderate degree (HCC) 09/24/2014   Seasonal and perennial allergic rhinitis 07/17/2014   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 Authorizing Provider  albuterol (VENTOLIN HFA) 108 (90 Base) MCG/ACT inhaler Inhale 2 puffs into the lungs every 6 (six) hours as needed for wheezing or shortness of breath. 11/22/22   Leath-Warren, Sadie Haber, NP  amLODipine (NORVASC) 5 MG tablet TAKE 1 TABLET (5 MG TOTAL) BY MOUTH DAILY. 02/12/22   Marinus Maw, MD  benzonatate (TESSALON) 100 MG capsule Take 1 capsule (100 mg total) by mouth 3 (three) times daily as needed for cough. 12/24/22   Glenford Bayley, NP  brompheniramine-pseudoephedrine-DM 30-2-10 MG/5ML syrup Take 5 mLs by mouth 4 (four)  times daily as needed. Patient not taking: Reported on 02/02/2023 11/22/22   Leath-Warren, Sadie Haber, NP  Budeson-Glycopyrrol-Formoterol (BREZTRI AEROSPHERE) 160-9-4.8 MCG/ACT AERO Inhale 2 puffs into the lungs 2 (two) times daily. Rinse mouth 12/24/22   Glenford Bayley, NP  Dupilumab (DUPIXENT) 300 MG/2ML SOPN INJECT 300 MG INTO THE SKIN EVERY 14 DAYS. Patient not taking: Reported on 12/24/2022 12/08/22    Jetty Duhamel D, MD  EPINEPHrine 0.3 mg/0.3 mL IJ SOAJ injection Inject into thigh for severe allergic reaction 09/28/22   Jetty Duhamel D, MD  levalbuterol (XOPENEX) 0.31 MG/3ML nebulizer solution Take 3 mLs (0.31 mg total) by nebulization every 4 (four) hours as needed for wheezing. 09/24/22   Kommor, Madison, MD  montelukast (SINGULAIR) 10 MG tablet Take 1 tablet (10 mg total) by mouth at bedtime. 12/24/22   Glenford Bayley, NP  predniSONE (DELTASONE) 10 MG tablet 4 tabs for 2 days, then 3 tabs for 2 days, 2 tabs for 2 days, then 1 tab for 2 days, then stop 12/24/22   Glenford Bayley, NP  primidone (MYSOLINE) 50 MG tablet 3 in the, 4 in the PM 02/02/23   Tat, Rebecca S, DO  revefenacin (YUPELRI) 175 MCG/3ML nebulizer solution Take 3 mLs (175 mcg total) by nebulization daily. 09/28/22   [name], MD  tiZANidine (ZANAFLEX) 4 MG capsule Take 1 capsule (4 mg total) by mouth 3 (three) times daily as needed for muscle spasms. Do not drink alcohol or drive while taking this medication.  May cause drowsiness. 05/31/22   Particia Nearing, PA-C  traMADol Janean Sark) 50 MG tablet 1 every 8 hours if needed for  cough 09/28/22   [name], MD    Family History Family History  Problem Relation Age of Onset   Lung cancer Mother    Emphysema Father    Arthritis Sister    Diabetes Maternal Grandmother    Thyroid disease Neg Hx     Social History Social History   Tobacco Use   Smoking status: Former    Current packs/day: 0.00    Average packs/day: 0.5 packs/day for 11.5 years (5.8 ttl pk-yrs)    Types: Cigarettes    Start date: 10/09/1998    Quit date: 04/09/2010    Years since quitting: 12.8   Smokeless tobacco: Former   Tobacco comments:    started at age 62.  1/2 ppd.    Vaping Use   Vaping status: Never Used  Substance Use Topics   Alcohol use: Yes    Comment: rarely   Drug use: No     Allergies   Azithromycin, Banana, Alvesco [ciclesonide], Anoro ellipta  [umeclidinium-vilanterol], Methylprednisolone, and Penicillins   Review of Systems Review of Systems Per HPI  Physical Exam Triage Vital Signs ED Triage Vitals [02/04/23 1043]  Encounter Vitals Group     BP 136/82     Systolic BP Percentile      Diastolic BP Percentile      Pulse Rate 91     Resp (!) 22     Temp 98.4 F (36.9 C)     Temp Source Oral     SpO2 (!) 89 %     Weight      Height      Head Circumference      Peak Flow      Pain Score 8     Pain Loc      Pain Education      Exclude from Growth Chart  No data found.  Updated Vital Signs BP 136/82 (BP Location: Right Arm)   Pulse 91   Temp 98.4 F (36.9 C) (Oral)   Resp (!) 22   SpO2 (!) 88%   Visual Acuity Right Eye Distance:   Left Eye Distance:   Bilateral Distance:    Right Eye Near:   Left Eye Near:    Bilateral Near:     Physical Exam Vitals and nursing note reviewed.  Constitutional:      Appearance: Normal appearance.  HENT:     Head: Atraumatic.     Right Ear: Tympanic membrane normal.     Left Ear: Tympanic membrane normal.     Nose: Nose normal.     Mouth/Throat:     Mouth: Mucous membranes are moist.     Pharynx: Oropharynx is clear.  Eyes:     Extraocular Movements: Extraocular movements intact.     Conjunctiva/sclera: Conjunctivae normal.  Cardiovascular:     Rate and Rhythm: Normal rate and regular rhythm.     Comments: Becomes tachycardic around 102 to 105 bpm with ambulation Pulmonary:     Breath sounds: Wheezing present. No rales.     Comments: Tachypneic, increased effort of breathing, severe diffuse wheezes bilaterally.  Accessory muscle use Musculoskeletal:        General: Normal range of motion.     Cervical back: Normal range of motion and neck supple.  Skin:    General: Skin is warm and dry.  Neurological:     General: No focal deficit present.     Mental Status: He is oriented to person, place, and time.     Motor: No weakness.     Gait: Gait normal.   Psychiatric:        Mood and Affect: Mood normal.        Thought Content: Thought content normal.        Judgment: Judgment normal.      UC Treatments / Results  Labs (all labs ordered are listed, but only abnormal results are displayed) Labs Reviewed - No data to display  EKG   Radiology No results found.  Procedures Procedures (including critical care time)  Medications Ordered in UC Medications  albuterol (PROVENTIL) (2.5 MG/3ML) 0.083% nebulizer solution 2.5 mg (2.5 mg Nebulization Given 02/04/23 1053)  predniSONE (DELTASONE) tablet 60 mg (60 mg Oral Given 02/04/23 1105)    Initial Impression / Assessment and Plan / UC Course  I have reviewed the triage vital signs and the nursing notes.  Pertinent labs & imaging results that were available during my care of the patient were reviewed by me and considered in my medical decision making (see chart for details).     Oxygen saturations ranging between 89 to 90% on room air, dipping between 86-88 post neb treatment worse with ambulation.  Chest x-ray pending but given inability to maintain safe oxygen saturations and severity of asthma exacerbation recommended going to the emergency department for more ongoing management.  They are agreeable to this and wished to go via private vehicle.  He has a family member with him today that plans to drive him.  Prednisone was given prior to discharge, he states he tolerates prednisone well but has had issues with Medrol Dosepak's and Solu-Medrol in the past so wishes to avoid those formulations  Final Clinical Impressions(s) / UC Diagnoses   Final diagnoses:  Severe persistent asthma with acute exacerbation  Hypoxia  SOB (shortness of breath)  Discharge Instructions      Go directly to the emergency department for further management as you are unable to maintain your oxygen saturations at a safe level at this time    ED Prescriptions   None    PDMP not reviewed this  encounter.   Particia Nearing, [place] 02/04/23 1111

## 2023-02-04 NOTE — ED Provider Notes (Signed)
[place] EMERGENCY DEPARTMENT AT [facility] Provider Note   CSN: [id number] Arrival date & time: 02/04/23  1117     History  Chief Complaint  Patient presents with   Chest Pain    Drew Sandoval is a 47 y.o. male.  History of asthma with prior hospitalizations and intubations presenting emergency department for shortness of breath.  Reports onset 4 days ago but acutely worsened last night.  Breathing treatments not helping.  Went to urgent care and received 60 mg of prednisone prior to arrival.  Was noted to be hypoxic on room air 8089%.  Did receive DuoNeb prior to arrival.  Still complains of shortness of breath.  He also notes that he is having some left-sided chest pain that has been ongoing for the past several days as well.   Chest Pain      Home Medications Prior to Admission medications   Medication Sig Start Date End Date Taking? Authorizing Provider  albuterol (VENTOLIN HFA) 108 (90 Base) MCG/ACT inhaler Inhale 2 puffs into the lungs every 6 (six) hours as needed for wheezing or shortness of breath. 11/22/22   Leath-Warren, Sadie Haber, NP  amLODipine (NORVASC) 5 MG tablet TAKE 1 TABLET (5 MG TOTAL) BY MOUTH DAILY. 02/12/22   Marinus Maw, MD  benzonatate (TESSALON) 100 MG capsule Take 1 capsule (100 mg total) by mouth 3 (three) times daily as needed for cough. 12/24/22   Glenford Bayley, NP  brompheniramine-pseudoephedrine-DM 30-2-10 MG/5ML syrup Take 5 mLs by mouth 4 (four) times daily as needed. Patient not taking: Reported on 02/02/2023 11/22/22   Leath-Warren, Sadie Haber, NP  Budeson-Glycopyrrol-Formoterol (BREZTRI AEROSPHERE) 160-9-4.8 MCG/ACT AERO Inhale 2 puffs into the lungs 2 (two) times daily. Rinse mouth 12/24/22   Glenford Bayley, NP  Dupilumab (DUPIXENT) 300 MG/2ML SOPN INJECT 300 MG INTO THE SKIN EVERY 14 DAYS. Patient not taking: Reported on 12/24/2022 12/08/22   Jetty Duhamel D, MD  EPINEPHrine 0.3 mg/0.3 mL IJ SOAJ injection Inject into  thigh for severe allergic reaction 09/28/22   Jetty Duhamel D, MD  levalbuterol (XOPENEX) 0.31 MG/3ML nebulizer solution Take 3 mLs (0.31 mg total) by nebulization every 4 (four) hours as needed for wheezing. 09/24/22   Kommor, Madison, MD  montelukast (SINGULAIR) 10 MG tablet Take 1 tablet (10 mg total) by mouth at bedtime. 12/24/22   Glenford Bayley, NP  predniSONE (DELTASONE) 10 MG tablet 4 tabs for 2 days, then 3 tabs for 2 days, 2 tabs for 2 days, then 1 tab for 2 days, then stop 12/24/22   Glenford Bayley, NP  primidone (MYSOLINE) 50 MG tablet 3 in the, 4 in the PM 02/02/23   Tat, Rebecca S, DO  revefenacin (YUPELRI) 175 MCG/3ML nebulizer solution Take 3 mLs (175 mcg total) by nebulization daily. 09/28/22   [name], MD  tiZANidine (ZANAFLEX) 4 MG capsule Take 1 capsule (4 mg total) by mouth 3 (three) times daily as needed for muscle spasms. Do not drink alcohol or drive while taking this medication.  May cause drowsiness. 05/31/22   Particia Nearing, PA-C  traMADol Janean Sark) 50 MG tablet 1 every 8 hours if needed for  cough 09/28/22   Jetty Duhamel D, MD      Allergies    Azithromycin, Banana, Alvesco [ciclesonide], Anoro ellipta [umeclidinium-vilanterol], Methylprednisolone, and Penicillins    Review of Systems   Review of Systems  Cardiovascular:  Positive for chest pain.    Physical Exam Updated Vital Signs  BP (!) 140/79   Pulse (!) 112   Temp 99 F (37.2 C) (Oral)   Resp (!) 23   Ht 6\' 3"  (1.905 m)   Wt 84.8 kg   SpO2 91%   BMI 23.37 kg/m  Physical Exam Vitals and nursing note reviewed.  Constitutional:      General: He is not in acute distress. HENT:     Head: Normocephalic.  Cardiovascular:     Rate and Rhythm: Normal rate and regular rhythm.     Heart sounds: Normal heart sounds.  Pulmonary:     Effort: Pulmonary effort is normal.     Breath sounds: Examination of the right-upper field reveals wheezing. Examination of the left-upper field reveals  wheezing. Examination of the right-middle field reveals wheezing. Examination of the left-middle field reveals wheezing. Examination of the right-lower field reveals wheezing. Examination of the left-lower field reveals wheezing. Wheezing present.     Comments: Poor air movement, diffuse wheezing.  Borderline hypoxia 90% on room air.  Speaking in short sentences, does not have accessory muscle use Musculoskeletal:     Right lower leg: No edema.     Left lower leg: No edema.  Neurological:     General: No focal deficit present.     Mental Status: He is alert.  Psychiatric:        Mood and Affect: Mood normal.        Behavior: Behavior normal.     ED Results / Procedures / Treatments   Labs (all labs ordered are listed, but only abnormal results are displayed) Labs Reviewed  BASIC METABOLIC PANEL - Abnormal; Notable for the following components:      Result Value   Calcium 8.6 (*)    All other components within normal limits  SARS CORONAVIRUS 2 BY RT PCR  CBC  TROPONIN I (HIGH SENSITIVITY)  TROPONIN I (HIGH SENSITIVITY)    EKG EKG Interpretation Date/Time:  Friday February 04 2023 11:49:08 EDT Ventricular Rate:  92 PR Interval:  169 QRS Duration:  71 QT Interval:  359 QTC Calculation: 445 R Axis:   79  Text Interpretation: Sinus rhythm ST elev, probable normal early repol pattern Confirmed by Estanislado Pandy [phone]) on 02/04/2023 1:10:33 PM  Radiology DG Chest Portable 1 View  Result Date: 02/04/2023 CLINICAL DATA:  concern for PNA.  Chest pain.  Productive cough. EXAM: PORTABLE CHEST 1 VIEW COMPARISON:  Earlier the same day. FINDINGS: Bilateral lungs appear hyperexpanded and hyperlucent with coarse bronchovascular markings, in keeping with COPD. Bilateral lungs otherwise appear clear. No dense consolidation or major lung collapse. Bilateral lateral costophrenic angles are clear. Normal cardio-mediastinal silhouette. No acute osseous abnormalities. The soft tissues are within  normal limits. IMPRESSION: 1. COPD. No active disease. Electronically Signed   By: Jules Schick M.D.   On: 02/04/2023 13:23   DG Chest 2 View  Result Date: 02/04/2023 CLINICAL DATA:  Worsening shortness of breath and chest tightness. EXAM: CHEST - 2 VIEW COMPARISON:  09/23/2022 FINDINGS: Normal heart size and mediastinal contours. There is no pleural fluid, interstitial edema or airspace disease. The lungs are hyperinflated and there is diffuse central airway thickening. IMPRESSION: Hyperinflation and central airway thickening compatible with bronchitis or reactive airways disease. Electronically Signed   By: Signa Kell M.D.   On: 02/04/2023 12:04    Procedures Procedures    Medications Ordered in ED Medications  albuterol (PROVENTIL) (2.5 MG/3ML) 0.083% nebulizer solution (10 mg/hr Nebulization Given 02/04/23 1209)  ipratropium (ATROVENT) nebulizer solution  1 mg (1 mg Nebulization Given 02/04/23 1209)  albuterol (PROVENTIL) (2.5 MG/3ML) 0.083% nebulizer solution (10 mg/hr Nebulization Given 02/04/23 1347)  magnesium sulfate IVPB 2 g 50 mL (0 g Intravenous Stopped 02/04/23 1434)    ED Course/ Medical Decision Making/ A&P Clinical Course as of 02/04/23 1439  Fri Feb 04, 2023  1309 Patient reevaluated.  Moving air better, oxygen saturation improved.  Continues to have diffuse wheezing in all lung fields and still complains of some subjective shortness of breath although improved.  Will give second hour-long breathing treatment and IV mag [TY]  1438 Patient reevaluated after second breathing treatment magnesium.  Nurse reports that he desaturated and placed back on oxygen, but has been able to since go back on room air saturating in the low 90s.  Continues to complain of shortness of breath has diffuse wheezing.  Chest x-ray without pneumonia.  No leukocytosis.  No electrolyte abnormalities.  Troponin negative.  EKG without ST segment changes indicate ischemia.  ACS unlikely given his complaint  of chest pain.  Will admit patient for acute asthma exacerbation at this time [TY]    Clinical Course User Index [TY] Coral Spikes, DO                                 Medical Decision Making 47 year old male history of asthma presenting emergency department for shortness of breath in the setting of asthma.  Borderline hypoxia 90% on room air.  Does not appear to be in significant respiratory distress although does have some poor air movement and diffuse wheezing.  Has a significant history with prior admissions and a remote history of intubation for his asthma.  Will give hour-long breathing treatment.  He did receive prednisone per chart review prior to arrival.  Spouse also confirms that he did receive prednisone.  Will get cardiac workup labs as well given his left-sided chest pain.  Chest x-ray as he has had productive cough with thick sputum production.  Will reevaluate patient.  See ED course for further MDM and disposition.  Amount and/or Complexity of Data Reviewed Labs: ordered. Radiology: ordered.  Risk Prescription drug management. Decision regarding hospitalization.          Final Clinical Impression(s) / ED Diagnoses Final diagnoses:  Moderate asthma with exacerbation, unspecified whether persistent    Rx / DC Orders ED Discharge Orders     None         Coral Spikes, DO 02/04/23 1439

## 2023-02-04 NOTE — ED Notes (Signed)
See triage notes. Pt states feels like his asthma is acting up. No shob/resp distress noted. Non diaphoretic. Color wnl.

## 2023-02-04 NOTE — ED Triage Notes (Signed)
Pt reports his neck to his lower back hurts when he coughs, SOB, and chest discomfort on left side x 4 days

## 2023-02-05 DIAGNOSIS — J209 Acute bronchitis, unspecified: Secondary | ICD-10-CM | POA: Diagnosis not present

## 2023-02-05 DIAGNOSIS — J4551 Severe persistent asthma with (acute) exacerbation: Secondary | ICD-10-CM | POA: Diagnosis not present

## 2023-02-05 DIAGNOSIS — I471 Supraventricular tachycardia, unspecified: Secondary | ICD-10-CM | POA: Diagnosis not present

## 2023-02-05 DIAGNOSIS — I10 Essential (primary) hypertension: Secondary | ICD-10-CM | POA: Diagnosis not present

## 2023-02-05 LAB — RENAL FUNCTION PANEL
Albumin: 3 g/dL — ABNORMAL LOW (ref 3.5–5.0)
Anion gap: 10 (ref 5–15)
BUN: 17 mg/dL (ref 6–20)
CO2: 21 mmol/L — ABNORMAL LOW (ref 22–32)
Calcium: 8.4 mg/dL — ABNORMAL LOW (ref 8.9–10.3)
Chloride: 108 mmol/L (ref 98–111)
Creatinine, Ser: 0.83 mg/dL (ref 0.61–1.24)
GFR, Estimated: >60 mL/min (ref >60)
Glucose, Bld: 107 mg/dL — ABNORMAL HIGH (ref 70–99)
Phosphorus: 3.1 mg/dL (ref 2.5–4.6)
Potassium: 3.6 mmol/L (ref 3.5–5.1)
Sodium: 139 mmol/L (ref 135–145)

## 2023-02-05 LAB — RESPIRATORY PANEL BY PCR

## 2023-02-05 LAB — CBC
HCT: 37 % — ABNORMAL LOW (ref 39.0–52.0)
Hemoglobin: 12.2 g/dL — ABNORMAL LOW (ref 13.0–17.0)
MCH: 30.6 pg (ref 26.0–34.0)
MCHC: 33 g/dL (ref 30.0–36.0)
MCV: 92.7 fL (ref 80.0–100.0)
Platelets: 252 10*3/uL (ref 150–400)
RBC: 3.99 MIL/uL — ABNORMAL LOW (ref 4.22–5.81)
RDW: 14.2 % (ref 11.5–15.5)
WBC: 10 10*3/uL (ref 4.0–10.5)
nRBC: 0 % (ref 0.0–0.2)

## 2023-02-05 LAB — MAGNESIUM: Magnesium: 2 mg/dL (ref 1.7–2.4)

## 2023-02-05 MED ORDER — ACETAMINOPHEN 325 MG PO TABS: 650.0000 mg | ORAL_TABLET | Freq: Four times a day (QID) | ORAL | Status: DC | PRN

## 2023-02-05 MED ORDER — PREDNISONE 50 MG PO TABS: 50.0000 mg | ORAL_TABLET | Freq: Every day | ORAL | 0 refills | Status: AC

## 2023-02-05 NOTE — Progress Notes (Addendum)
SATURATION QUALIFICATIONS: (This note is used to comply with regulatory documentation for home oxygen)  Patient Saturations on Room Air at Rest = 98%  Patient Saturations on Room Air while Ambulating = 95%  Patient Saturations on 2 Liters of oxygen while Ambulating = 100%  Please briefly explain why patient needs home oxygen:

## 2023-02-05 NOTE — Discharge Summary (Signed)
Physician Discharge Summary  Drew Sandoval ZOX:[id number] DOB: 01/17/1976 DOA: 02/04/2023  PCP: Pcp, No  Admit date: 02/04/2023 Discharge date: 02/05/2023 Admitted From: Home Disposition: Home Recommendations for Outpatient Follow-up:  Follow up with PCP and pulmonology in 1 to 2 weeks Please follow up on the following pending results: None  Home Health: None Equipment/Devices: None  Discharge Condition: Stable CODE STATUS: Full code   Hospital course 47 y.o. male with PMH of severe persistent asthma, hyper IgE syndrome, SVT s/p ablation, tremor and HTN presenting with left-sided chest pain, shortness of breath and cough.  Patient had left-sided chest pain mainly with cough and deep breathing.  Initially seen in urgent care when he desaturated to 86% with ambulation on RA after breathing treatments and p.o. steroid.  He was sent to ED for further evaluation.  In ED, mild temp to 99.  RR ranges from 12-30.  Desaturated to 88% on room air and put on 2 L.  BMP and CBC basically normal.  Serial troponin negative.  COVID-19 PCR negative.  CXR sinus rhythm in 90s with early repolarization.  Received albuterol nebulizers x 2 and Atrovent x 1, magnesium sulfate, and hospital service called for admission. Patient was admitted on p.o. prednisone, scheduled and as needed nebulizers.  Full RVP nonreactive.  Procalcitonin negative.  Slightly elevated D-dimer.   The next day, patient's breathing and cough improved.  Chest pain resolved although he had some generalized pericardial pain with cough.  Ambulated on room air and maintain saturation above 95% without respiratory distress.  Discharged on p.o. prednisone 50 mg daily for 4 more days.  Patient has already been optimized on triple therapy and rescue inhalers and nebulizers.  Recommended outpatient follow-up with PCP and pulmonology.   See individual problem list below for more.   Problems addressed during this hospitalization Principal  Problem:   Acute asthma exacerbation Active Problems:   Asthma, severe persistent   Hyper-IgE syndrome (HCC)   SVT (supraventricular tachycardia)   Hypertension, essential   Essential tremor   Acute bronchitis              Time spent 35 minutes  Vital signs Vitals:   02/05/23 0133 02/05/23 0521 02/05/23 0856 02/05/23 0900  BP:  135/85    Pulse:  72    Temp:  98.3 F (36.8 C)    Resp:  18    Height:      Weight:      SpO2: 96% 95% 91% 98%  TempSrc:      BMI (Calculated):         Discharge exam  GENERAL: No apparent distress.  Nontoxic. HEENT: MMM.  Vision and hearing grossly intact.  NECK: Supple.  No apparent JVD.  RESP:  No IWOB.  Good aeration bilaterally.  Some rhonchi with deep breathing. CVS:  RRR. Heart sounds normal.  ABD/GI/GU: BS+. Abd soft, NTND.  MSK/EXT:  Moves extremities. No apparent deformity. No edema.  SKIN: no apparent skin lesion or wound NEURO: Awake and alert. Oriented appropriately.  No apparent focal neuro deficit. PSYCH: Calm. Normal affect.   Discharge Instructions Discharge Instructions     Diet - low sodium heart healthy   Complete by: As directed    Discharge instructions   Complete by: As directed    It has been a pleasure taking care of you!  You were hospitalized due to asthma exacerbation for which you have been treated with steroid and breathing treatments.  You are oxygen and symptoms improved.  We are discharging you on more prednisone.  Continue using inhalers and nebulizers.  Follow-up with your primary care doctor and pulmonologist next week.  Avoid over-the-counter pain medication such as ibuprofen.  You may use Tylenol as needed for pain.   Take care,   Increase activity slowly   Complete by: As directed       Allergies as of 02/05/2023       Reactions   Azithromycin Rash   Banana Swelling   Throat swelling   Alvesco [ciclesonide] Rash   Possible reaction- rash   Anoro Ellipta [umeclidinium-vilanterol]  Rash   Possible- rash   Methylprednisolone Other (See Comments)   unknown   Penicillins Rash        Medication List     STOP taking these medications    ibuprofen 200 MG tablet Commonly known as: ADVIL       TAKE these medications    acetaminophen 500 MG tablet Commonly known as: TYLENOL Take 1,000 mg by mouth every 6 (six) hours as needed for moderate pain. What changed: Another medication with the same name was added. Make sure you understand how and when to take each.   acetaminophen 325 MG tablet Commonly known as: TYLENOL Take 2 tablets (650 mg total) by mouth every 6 (six) hours as needed for mild pain, headache or fever (or Fever >/= 101). What changed: You were already taking a medication with the same name, and this prescription was added. Make sure you understand how and when to take each.   albuterol 108 (90 Base) MCG/ACT inhaler Commonly known as: VENTOLIN HFA Inhale 2 puffs into the lungs every 6 (six) hours as needed for wheezing or shortness of breath.   amLODipine 5 MG tablet Commonly known as: NORVASC TAKE 1 TABLET (5 MG TOTAL) BY MOUTH DAILY. What changed:  when to take this reasons to take this   Breztri Aerosphere 160-9-4.8 MCG/ACT Aero Generic drug: Budeson-Glycopyrrol-Formoterol Inhale 2 puffs into the lungs 2 (two) times daily. Rinse mouth   Dupixent 300 MG/2ML Sopn Generic drug: Dupilumab INJECT 300 MG INTO THE SKIN EVERY 14 DAYS.   EPINEPHrine 0.3 mg/0.3 mL Soaj injection Commonly known as: EPI-PEN Inject into thigh for severe allergic reaction   levalbuterol 0.31 MG/3ML nebulizer solution Commonly known as: XOPENEX Take 3 mLs (0.31 mg total) by nebulization every 4 (four) hours as needed for wheezing.   montelukast 10 MG tablet Commonly known as: Singulair Take 1 tablet (10 mg total) by mouth at bedtime. What changed: when to take this   predniSONE 50 MG tablet Commonly known as: DELTASONE Take 1 tablet (50 mg total) by mouth  daily with breakfast for 4 days. Start taking on: February 06, 2023 What changed:  medication strength how much to take how to take this when to take this additional instructions   primidone 50 MG tablet Commonly known as: MYSOLINE 3 in the, 4 in the PM What changed:  how much to take how to take this when to take this additional instructions   traMADol 50 MG tablet Commonly known as: ULTRAM 1 every 8 hours if needed for  cough What changed:  how much to take how to take this when to take this reasons to take this additional instructions   Yupelri 175 MCG/3ML nebulizer solution Generic drug: revefenacin Take 3 mLs (175 mcg total) by nebulization daily.        Consultations: None  Procedures/Studies:   DG Chest Portable 1 View  Result Date: 02/04/2023  CLINICAL DATA:  concern for PNA.  Chest pain.  Productive cough. EXAM: PORTABLE CHEST 1 VIEW COMPARISON:  Earlier the same day. FINDINGS: Bilateral lungs appear hyperexpanded and hyperlucent with coarse bronchovascular markings, in keeping with COPD. Bilateral lungs otherwise appear clear. No dense consolidation or major lung collapse. Bilateral lateral costophrenic angles are clear. Normal cardio-mediastinal silhouette. No acute osseous abnormalities. The soft tissues are within normal limits. IMPRESSION: 1. COPD. No active disease. Electronically Signed   By: Jules Schick M.D.   On: 02/04/2023 13:23   DG Chest 2 View  Result Date: 02/04/2023 CLINICAL DATA:  Worsening shortness of breath and chest tightness. EXAM: CHEST - 2 VIEW COMPARISON:  09/23/2022 FINDINGS: Normal heart size and mediastinal contours. There is no pleural fluid, interstitial edema or airspace disease. The lungs are hyperinflated and there is diffuse central airway thickening. IMPRESSION: Hyperinflation and central airway thickening compatible with bronchitis or reactive airways disease. Electronically Signed   By: Signa Kell M.D.   On:  02/04/2023 12:04       The results of significant diagnostics from this hospitalization (including imaging, microbiology, ancillary and laboratory) are listed below for reference.     Microbiology: Recent Results (from the past 240 hour(s))  SARS Coronavirus 2 by RT PCR (hospital order, performed in [facility] hospital lab) *cepheid single result test* Anterior Nasal Swab     Status: None   Collection Time: 02/04/23 11:42 AM   Specimen: Anterior Nasal Swab  Result Value Ref Range Status   SARS Coronavirus 2 by RT PCR NEGATIVE NEGATIVE Final    Comment: (NOTE) SARS-CoV-2 target nucleic acids are NOT DETECTED.  The SARS-CoV-2 RNA is generally detectable in upper and lower respiratory specimens during the acute phase of infection. The lowest concentration of SARS-CoV-2 viral copies this assay can detect is 250 copies / mL. A negative result does not preclude SARS-CoV-2 infection and should not be used as the sole basis for treatment or other patient management decisions.  A negative result may occur with improper specimen collection / handling, submission of specimen other than nasopharyngeal swab, presence of viral mutation(s) within the areas targeted by this assay, and inadequate number of viral copies (<250 copies / mL). A negative result must be combined with clinical observations, patient history, and epidemiological information.  Fact Sheet for Patients:   RoadLapTop.co.za  Fact Sheet for Healthcare Providers: http://kim-miller.com/  This test is not yet approved or  cleared by the [place] FDA and has been authorized for detection and/or diagnosis of SARS-CoV-2 by FDA under an Emergency Use Authorization (EUA).  This EUA will remain in effect (meaning this test can be used) for the duration of the COVID-19 declaration under Section 564(b)(1) of the Act, 21 U.S.C. section 360bbb-3(b)(1), unless the authorization is  terminated or revoked sooner.  Performed at [facility], [address] [place], [place] [address]   Respiratory (~20 pathogens) panel by PCR     Status: None   Collection Time: 02/04/23  4:00 PM   Specimen: Nasopharyngeal Swab; Respiratory  Result Value Ref Range Status   Adenovirus NOT DETECTED NOT DETECTED Final   Coronavirus 229E NOT DETECTED NOT DETECTED Final    Comment: (NOTE) The Coronavirus on the Respiratory Panel, DOES NOT test for the novel  Coronavirus (2019 nCoV)    Coronavirus HKU1 NOT DETECTED NOT DETECTED Final   Coronavirus NL63 NOT DETECTED NOT DETECTED Final   Coronavirus OC43 NOT DETECTED NOT DETECTED Final   Metapneumovirus NOT DETECTED NOT DETECTED  Final   Rhinovirus / Enterovirus NOT DETECTED NOT DETECTED Final   Influenza A NOT DETECTED NOT DETECTED Final   Influenza B NOT DETECTED NOT DETECTED Final   Parainfluenza Virus 1 NOT DETECTED NOT DETECTED Final   Parainfluenza Virus 2 NOT DETECTED NOT DETECTED Final   Parainfluenza Virus 3 NOT DETECTED NOT DETECTED Final   Parainfluenza Virus 4 NOT DETECTED NOT DETECTED Final   Respiratory Syncytial Virus NOT DETECTED NOT DETECTED Final   Bordetella pertussis NOT DETECTED NOT DETECTED Final   Bordetella Parapertussis NOT DETECTED NOT DETECTED Final   Chlamydophila pneumoniae NOT DETECTED NOT DETECTED Final   Mycoplasma pneumoniae NOT DETECTED NOT DETECTED Final    Comment: Performed at [facility], [address], [place], [place] [address]     Labs:  CBC: Recent Labs  Lab 02/04/23 1159 02/05/23 1009  WBC 7.1 10.0  HGB 13.6 12.2*  HCT 41.0 37.0*  MCV 92.6 92.7  PLT 274 252   BMP &GFR Recent Labs  Lab 02/04/23 1159 02/05/23 1009  NA 137 139  K 3.9 3.6  CL 105 108  CO2 25 21*  GLUCOSE 89 107*  BUN 15 17  CREATININE 0.84 0.83  CALCIUM 8.6* 8.4*  MG  --  2.0  PHOS  --  3.1   Estimated Creatinine Clearance: 131.5 mL/min (by C-G formula based on SCr of 0.83 mg/dL). Liver &  Pancreas: Recent Labs  Lab 02/05/23 1009  ALBUMIN 3.0*   No results for input(s): "LIPASE", "AMYLASE" in the last 168 hours. No results for input(s): "AMMONIA" in the last 168 hours. Diabetic: No results for input(s): "HGBA1C" in the last 72 hours. No results for input(s): "GLUCAP" in the last 168 hours. Cardiac Enzymes: No results for input(s): "CKTOTAL", "CKMB", "CKMBINDEX", "TROPONINI" in the last 168 hours. No results for input(s): "PROBNP" in the last 8760 hours. Coagulation Profile: No results for input(s): "INR", "PROTIME" in the last 168 hours. Thyroid Function Tests: No results for input(s): "TSH", "T4TOTAL", "FREET4", "T3FREE", "THYROIDAB" in the last 72 hours. Lipid Profile: No results for input(s): "CHOL", "HDL", "LDLCALC", "TRIG", "CHOLHDL", "LDLDIRECT" in the last 72 hours. Anemia Panel: No results for input(s): "VITAMINB12", "FOLATE", "FERRITIN", "TIBC", "IRON", "RETICCTPCT" in the last 72 hours. Urine analysis:    Component Value Date/Time   COLORURINE BROWN (A) 06/13/2021 0001   APPEARANCEUR CLEAR 06/13/2021 0001   LABSPEC >1.030 (H) 06/13/2021 0001   PHURINE 5.5 06/13/2021 0001   GLUCOSEU NEGATIVE 06/13/2021 0001   HGBUR LARGE (A) 06/13/2021 0001   BILIRUBINUR SMALL (A) 06/13/2021 0001   BILIRUBINUR negative 06/11/2021 0844   KETONESUR NEGATIVE 06/13/2021 0001   PROTEINUR 30 (A) 06/13/2021 0001   UROBILINOGEN 0.2 06/11/2021 0844   UROBILINOGEN 0.2 06/24/2011 0858   NITRITE NEGATIVE 06/13/2021 0001   LEUKOCYTESUR NEGATIVE 06/13/2021 0001   Sepsis Labs: Invalid input(s): "PROCALCITONIN", "LACTICIDVEN"   SIGNED:  Almon Hercules, MD  [facility] 02/05/2023, 5:21 PM

## 2023-02-05 NOTE — Progress Notes (Signed)
   02/05/23 1109  TOC Brief Assessment  Insurance and Status Reviewed  Patient has primary care physician No (NO PCP listed)  Home environment has been reviewed From Home  Prior level of function: Independent  Prior/Current Home Services No current home services  Social Determinants of Health Reivew SDOH reviewed no interventions necessary  Readmission risk has been reviewed Yes  Transition of care needs no transition of care needs at this time

## 2023-02-05 NOTE — Plan of Care (Signed)
°  Problem: Activity: °Goal: Risk for activity intolerance will decrease °Outcome: Progressing °  °Problem: Nutrition: °Goal: Adequate nutrition will be maintained °Outcome: Progressing °  °Problem: Coping: °Goal: Level of anxiety will decrease °Outcome: Progressing °  °

## 2023-02-05 NOTE — Progress Notes (Signed)
7829 Patient eating, unavailable for nebulizer at this time.

## 2023-02-05 NOTE — Progress Notes (Signed)
Pt wife brought home med in. When this nurse informed pt that it was policy to not take medication brought from home.  Pt sated he was taking his IV and leaving.  MD and charge nurse made aware.

## 2023-02-05 NOTE — Progress Notes (Signed)
Patient reported to tech that he had chest pain. Upon entering patient's room, pt had nasal cannula off and refused to put it back on. Offered to do EKG, patient refused. Patient also refused morning labs, patient agitated and stated, "Just leave me alone." MD notified.

## 2023-02-11 ENCOUNTER — Telehealth: Payer: Self-pay | Admitting: Primary Care

## 2023-02-11 NOTE — Telephone Encounter (Signed)
Called the pt and there was no answer- LMTCB    

## 2023-02-11 NOTE — Telephone Encounter (Signed)
Patient states having symptoms of shortness of breath. Pharmacy is [facility]. Patient phone number is [phone].

## 2023-02-12 ENCOUNTER — Inpatient Hospital Stay
Admission: RE | Admit: 2023-02-12 | Discharge: 2023-02-12 | Discharge status: Home / Self Care | Payer: Self-pay | Source: Ambulatory Visit | Attending: Family Medicine

## 2023-02-12 VITALS — BP 154/93 | HR 91 | Temp 98.9°F | Resp 22

## 2023-02-12 DIAGNOSIS — J4551 Severe persistent asthma with (acute) exacerbation: Secondary | ICD-10-CM | POA: Diagnosis not present

## 2023-02-12 DIAGNOSIS — T7840XA Allergy, unspecified, initial encounter: Secondary | ICD-10-CM

## 2023-02-12 MED ORDER — PREDNISONE 20 MG PO TABS: 40.0000 mg | ORAL_TABLET | Freq: Every day | ORAL | 0 refills | Status: DC

## 2023-02-12 NOTE — ED Triage Notes (Signed)
Pt reports he is still SOB and sinus pressure under eye x 2 days.    States he stuck his self in the leg with an epi pen yesterday as he began to gasp for air.  Ate cheese and crackers before onset  States couldn't get in contact with pulmonology.

## 2023-02-12 NOTE — Discharge Instructions (Signed)
Go to the emergency department for any worsening symptoms.

## 2023-02-15 ENCOUNTER — Telehealth: Payer: Self-pay | Admitting: Internal Medicine

## 2023-02-15 NOTE — Telephone Encounter (Signed)
ATC x1 LVM for patient to call our office back regarding prior message.  

## 2023-02-15 NOTE — Telephone Encounter (Signed)
Sending to Dr. Maple Hudson he is the provider for this patient and is in clinic today

## 2023-02-15 NOTE — ED Provider Notes (Signed)
[facility]    CSN: [id number] Arrival date & time: 02/12/23  1038      History   Chief Complaint Chief Complaint  Patient presents with   Cough    Asthma i call my doctor but didn't get no call back. So I got to come here for relief - Entered by patient    HPI Drew Sandoval is a 47 y.o. male.   Presenting today with 2 day history of worsening SOB, wheezing, cough. He states he ate cheese and crackers yesterday and immediately felt like his throat and lungs were closing up so gave his epi pen in his leg with good relief. Was hospitalized on 02/04/23 for severe asthma exacerbation, discharged on oral prednisone. States he took 2 doses but then lost the remaining supply and has been worsening ever since discontinuation. He has a pmhx of hyper IgE syndrome, severe allergies and asthma. Continues to take his allergy and asthma regimen consistently.    Past Medical History:  Diagnosis Date   Acute conjunctivitis of right eye 10/07/2014   Acute respiratory failure with hypoxia (HCC) 07/23/2013   Asthma    Asthma    Phreesia 06/26/2020   Asthma, severe persistent 07/16/2010   PFT >> spiro 2012 with FEV1 1.14 (28%), ratio 45 Alpha 1 (08/12/10) >>MM ([employer] [facility])  IGE 1747, positive allergy profile >> Elevated eos on CBC S/p intubation 10/2010 Job exposure to respiratory irritant - bleach, chicken Allergy vaccine begun 11/09/2010. DC'd by pt 2013- resumed 2013> 1:50 GH; DC'd [facility] 9/24-9/27/ 2013- Status asthma, no vent. [facility] 3/ 2015- status ast   COVID-19 virus infection 06/17/2020   January, 2022. No hosp, no MAB   Eczema, allergic 07/22/2011   Essential tremor 06/27/2020   Hypertension    Seasonal allergies    Seasonal and perennial allergic rhinitis 07/17/2014   FENO 03/18/16- 100  High, indicates allergic asthma   Sinusitis, maxillary, 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   Sinus CT 12/10/2013 >>>    Status asthmaticus 10/19/2010   Sustained SVT (HCC)  09/24/2014   SVT (supraventricular tachycardia) (HCC) 09/24/2014    Patient Active Problem List   Diagnosis Date Noted   Acute asthma exacerbation 02/04/2023   Acute bronchitis 12/24/2022   Aspiration pneumonia (HCC) 08/26/2021   Hematuria 06/11/2021   Periumbilical abdominal pain 06/11/2021   Thyroid nodule 11/18/2020   Essential tremor 06/27/2020   Hypertension, essential 06/17/2020   Gynecomastia, male 04/24/2018   Sustained SVT (HCC) 09/24/2014   SVT (supraventricular tachycardia) (HCC) 09/24/2014   Malnutrition of moderate degree (HCC) 09/24/2014   Seasonal and perennial allergic rhinitis 07/17/2014   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 Authorizing Provider  predniSONE (DELTASONE) 20 MG tablet Take 2 tablets (40 mg total) by mouth daily with breakfast. 02/12/23  Yes Particia Nearing, PA-C  acetaminophen (TYLENOL) 325 MG tablet Take 2 tablets (650 mg total) by mouth every 6 (six) hours as needed for mild pain, headache or fever (or Fever >/=  101). 02/05/23   Almon Hercules, MD  acetaminophen (TYLENOL) 500 MG tablet Take 1,000 mg by mouth every 6 (six) hours as needed for moderate pain.    [provider]  albuterol (VENTOLIN HFA) 108 (90 Base) MCG/ACT inhaler Inhale 2 puffs into the lungs every 6 (six) hours as needed for wheezing or shortness of breath. 11/22/22   Leath-Warren, Sadie Haber, NP  amLODipine (NORVASC) 5 MG tablet TAKE 1 TABLET (5 MG TOTAL) BY MOUTH DAILY. Patient taking  differently: Take 5 mg by mouth as needed (high blood pressure). 02/12/22   Marinus Maw, MD  Budeson-Glycopyrrol-Formoterol (BREZTRI AEROSPHERE) 160-9-4.8 MCG/ACT AERO Inhale 2 puffs into the lungs 2 (two) times daily. Rinse mouth 12/24/22   Glenford Bayley, NP  Dupilumab (DUPIXENT) 300 MG/2ML SOPN INJECT 300 MG INTO THE SKIN EVERY 14 DAYS. Patient not taking: Reported on 02/04/2023 12/08/22   Jetty Duhamel D, MD  EPINEPHrine 0.3 mg/0.3 mL IJ SOAJ injection Inject into thigh for severe allergic reaction 09/28/22   Jetty Duhamel D, MD  levalbuterol (XOPENEX) 0.31 MG/3ML nebulizer solution Take 3 mLs (0.31 mg total) by nebulization every 4 (four) hours as needed for wheezing. 09/24/22   Kommor, Madison, MD  montelukast (SINGULAIR) 10 MG tablet Take 1 tablet (10 mg total) by mouth at bedtime. Patient taking differently: Take 10 mg by mouth daily. 12/24/22   Glenford Bayley, NP  primidone (MYSOLINE) 50 MG tablet 3 in the, 4 in the PM Patient taking differently: Take 200 mg by mouth in the morning and at bedtime. 02/02/23   Tat, Octaviano Batty, DO  revefenacin (YUPELRI) 175 MCG/3ML nebulizer solution Take 3 mLs (175 mcg total) by nebulization daily. 09/28/22   [name], MD  traMADol (ULTRAM) 50 MG tablet 1 every 8 hours if needed for  cough Patient taking differently: Take 50 mg by mouth every 8 (eight) hours as needed for moderate pain (cough). 09/28/22   [name], MD    Family History Family History  Problem Relation Age of Onset   Lung cancer Mother    Emphysema Father    Arthritis Sister    Diabetes Maternal Grandmother    Thyroid disease Neg Hx     Social History Social History   Tobacco Use   Smoking status: Former    Current packs/day: 0.00    Average packs/day: 0.5 packs/day for 11.5 years (5.8 ttl pk-yrs)    Types: Cigarettes    Start date: 10/09/1998    Quit date: 04/09/2010    Years since quitting: 12.8   Smokeless tobacco: Former   Tobacco comments:    started  at age 10.  1/2 ppd.    Vaping Use   Vaping status: Never Used  Substance Use Topics   Alcohol use: Yes    Comment: rarely   Drug use: No     Allergies   Azithromycin, Banana, [place] [ciclesonide], Anoro ellipta [umeclidinium-vilanterol], Methylprednisolone, and Penicillins   Review of Systems Review of Systems PER HPI  Physical Exam Triage Vital Signs ED Triage Vitals  Encounter Vitals Group     BP 02/12/23 1105 (!) 154/93     Systolic BP Percentile --      Diastolic BP Percentile --      Pulse Rate 02/12/23 1105 91     Resp 02/12/23 1105 (!) 22     Temp 02/12/23 1105 98.9 F (37.2 C)     Temp Source 02/12/23 1105 Oral     SpO2 02/12/23  1105 92 %     Weight --      Height --      Head Circumference --      Peak Flow --      Pain Score 02/12/23 1110 0     Pain Loc --      Pain Education --      Exclude from Growth Chart --    No data found.  Updated Vital Signs BP (!) 154/93 (BP Location: Right Arm)   Pulse 91   Temp 98.9 F (37.2 C) (Oral)   Resp (!) 22   SpO2 92%   Visual Acuity Right Eye Distance:   Left Eye Distance:   Bilateral Distance:    Right Eye Near:   Left Eye Near:    Bilateral Near:     Physical Exam Vitals and nursing note reviewed.  Constitutional:      Appearance: Normal appearance.  HENT:     Head: Atraumatic.  Eyes:     Extraocular Movements: Extraocular movements intact.     Conjunctiva/sclera: Conjunctivae normal.  Cardiovascular:     Rate and Rhythm: Normal rate and regular rhythm.  Pulmonary:     Effort: Pulmonary effort is normal.     Breath sounds: Wheezing present.  Musculoskeletal:        General: Normal range of motion.     Cervical back: Normal range of motion and neck supple.  Skin:    General: Skin is warm and dry.  Neurological:     General: No focal deficit present.     Mental Status: He is oriented to person, place, and time.  Psychiatric:        Mood and Affect: Mood normal.        Thought Content:  Thought content normal.        Judgment: Judgment normal.      UC Treatments / Results  Labs (all labs ordered are listed, but only abnormal results are displayed) Labs Reviewed - No data to display  EKG   Radiology No results found.  Procedures Procedures (including critical care time)  Medications Ordered in UC Medications - No data to display  Initial Impression / Assessment and Plan / UC Course  I have reviewed the triage vital signs and the nursing notes.  Pertinent labs & imaging results that were available during my care of the patient were reviewed by me and considered in my medical decision making (see chart for details).     Vitals stable and he appears in no acute distress today. Discussed at length that the recommendation is to go to ER after any needed uses of an epi pen and he voices understanding and agreement on this. For now will send more prednisone as he lost the previous supply and close follow up with specialist.   Final Clinical Impressions(s) / UC Diagnoses   Final diagnoses:  Severe persistent asthma with acute exacerbation  Allergic reaction, initial encounter     Discharge Instructions      Go to the emergency department for any worsening symptoms    ED Prescriptions     Medication Sig Dispense Auth. Provider   predniSONE (DELTASONE) 20 MG tablet Take 2 tablets (40 mg total) by mouth daily with breakfast. 14 tablet Particia Nearing, [place]      PDMP not reviewed this encounter.   Particia Nearing, [place] 02/15/23 2221    Particia Nearing, [place] 02/16/23 1819

## 2023-02-15 NOTE — Telephone Encounter (Signed)
I think hee is on prednisone 40 mg daily now- refill if needed. Offer next available held-space appointment.

## 2023-02-16 NOTE — Telephone Encounter (Signed)
Patient is returning phone call. Patient phone number is [phone].

## 2023-02-17 NOTE — Progress Notes (Signed)
Patient will need to call Dupixent's copay card company for additional fund support if they can provide. This is recurrent issue for patient and he shoiuyld be familiar with process. Unfortunately any specialty medication (biologic) is going to encounter this problem because it is an inherent insurance structuring problem.   If no luck with copay card company, patient will need samples for remainder of calendar year  Chesley Mires, PharmD, MPH, BCPS, CPP Clinical Pharmacist (Rheumatology and Pulmonology)+   Patient ID: Drew Sandoval, male    DOB: 20-Jun-1975    MRN: [id number]  HPI M  former smoker followed for severe asthma/ COPD, chronic sinusitis, hyper-eosinophilia and hyper IgE, complicated by history SVT/cardioversion FENO 03/18/16- 100  High, indicates allergic asthma Office Spirometry 03/18/2016-severe obstructive airways disease FVC 2.62/51%, FEV1 1.30/31%, FEV1/FVC 0.50, FEF 25-75 0.61/14% Office Spirometry 01/25/17-severe obstructive airways disease. FVC 3.52/68%, FEV1 1.80/43%, ratio or 0.51, FEF 25-75% 0.76/18% IgE was too high for Xolair. He could not afford Nucala or allergy vaccine. CBC w diff 08/18/21- EOS H 17.6K/Ul Cinqair infusion@ [facility]- ordered 07/21/17 ----------------------------------------------------   09/28/22-  Urgent care x 2 recently for Asthma/ COPD. K was 2.8. -----Patient complains of Dry cough, SOB and chest tightness. Patient is not sleeping well. Symptoms present x1 week ED/UC visits 5/12 and 5/18.  He has all of his meds and has continued Dupixent injections.  Bothersome cough feels like barely controlled asthma.  He denies by previous infection.  Says problems started when he mowed his lawn wearing a mask.  He thinks pollen got in through his eyes.  Asks EpiPen refill.   -Breztri, Dupixent, Singulair, Ventolin hfa, neb levalbuteroldiscussed options.  We discussed extending prednisone and adding samples Yupelri nebulizer solution. CXR 09/23/22  1V- IMPRESSION: No active cardiopulmonary disease.  02/18/23- 47 year old male former smoker (5.75 pk yrs) followed for severe Asthma/ COPD/ Dupixent, chronic Sinusitis, persistent Eosinophilia, hyper IgE-too high for Xolair, history SVT/cardioversion/ ablation, Kidney stone, Covid infection Jan, 2022, HTN, Tremor,  -Breztri, Dupixent, Singulair, Ventolin hfa, neb levalbuterol ED x3 in last month for asthma. Admission 9/27-9/28. ACT score 7 Had flu shot. Finished prednisone from last ED visit. Not using Dupixent- insurance not covering. See note from [facility]/pharmacy. Not using [facility]- pharmacy hasn't said it has come in yet. He feels he needs more antibiotic. Prednisone ends today, but does help. We are able to provide Breztri samples and Dupixent samples today- short term. CXR 02/04/23- MPRESSION: Hyperinflation and central airway thickening compatible with bronchitis or reactive airways disease.   ROS-see HPI  "+" = positive Constitutional:    weight loss, night sweats, fevers, chills, fatigue, lassitude. HEENT:    headaches, difficulty swallowing, tooth/dental problems, sore throat,       sneezing, itching, ear ache, +nasal congestion, +post nasal drip, snoring CV:    chest pain, orthopnea, PND, swelling in lower extremities, anasarca,  dizziness, +palpitations Resp:   +shortness of breath with exertion or at rest.                productive cough,  +non-productive cough, coughing up of blood.              change in color of mucus.  +wheezing.   Skin:    rash or lesions. GI:  No-   heartburn, indigestion, abdominal pain, nausea, vomiting,  GU:  MS:   joint pain, stiffness, decreased range of motion, back pain. Neuro-     nothing unusual Psych:  change in mood or affect.  depression or anxiety.  memory loss.  Objective:   Physical Exam General- Alert, Oriented, Affect-reserved/ laconic, Distress-none acute;  Tall, thin Skin- +tatoos Lymphadenopathy- none Head- atraumatic             Eyes- Gross vision intact, PERRLA, conjunctivae clear secretions            Ears- Hearing, canals normal            Nose- +clear, no-Septal dev, mucus, polyps, erosion, perforation             Throat- Mallampati II , mucosa clear , drainage- none, tonsils- atrophic Neck- flexible , trachea midline, no stridor , thyroid nl, carotid no bruit Chest - symmetrical excursion , unlabored           Heart/CV- RRR  , no murmur , no gallop  , no rub, nl s1 s2                           - JVD- none , edema- none, stasis changes- none, varices- none           Lung-   +Diminished/ clear, unlabored,  Cough+dry,  dullness-none, rub- none,  Wheeze-none Abd- scaphoid  Br/ Gen/ Rectal- Not done, not indicated Extrem- cyanosis- none, clubbing, none, atrophy- none, strength- nl Neuro- unremarkable

## 2023-02-17 NOTE — Telephone Encounter (Signed)
Spoke with patient regarding prior message . Ok per Dr.Young to use his 11:30 spot for patient to come into the office . Patient's voice was understanding.Nothing else further needed.

## 2023-02-17 NOTE — Telephone Encounter (Signed)
Patient has appt 02/18/23. Requested Tramadol refill can be addressed at that time.

## 2023-02-18 ENCOUNTER — Telehealth: Payer: Self-pay

## 2023-02-18 ENCOUNTER — Ambulatory Visit: Payer: BC Managed Care – PPO | Admitting: Internal Medicine

## 2023-02-18 ENCOUNTER — Encounter: Payer: Self-pay | Admitting: Internal Medicine

## 2023-02-18 VITALS — BP 130/87 | HR 72 | Ht 75.0 in | Wt 187.5 lb

## 2023-02-18 DIAGNOSIS — J4551 Severe persistent asthma with (acute) exacerbation: Secondary | ICD-10-CM | POA: Diagnosis not present

## 2023-02-18 DIAGNOSIS — I471 Supraventricular tachycardia, unspecified: Secondary | ICD-10-CM | POA: Diagnosis not present

## 2023-02-18 MED ORDER — TRAMADOL HCL 50 MG PO TABS: ORAL_TABLET | ORAL | 0 refills | Status: DC

## 2023-02-18 MED ORDER — PREDNISONE 20 MG PO TABS: 20.0000 mg | ORAL_TABLET | Freq: Every day | ORAL | 1 refills | Status: DC

## 2023-02-18 MED ORDER — DOXYCYCLINE HYCLATE 100 MG PO TABS: 100.0000 mg | ORAL_TABLET | Freq: Two times a day (BID) | ORAL | 0 refills | Status: DC

## 2023-02-18 NOTE — Telephone Encounter (Signed)
Pt's current insurance plan stops paying anything towards pt's medications in the middle of the year, pt will be sustained on samples until 2025. Provided pt with #3 boxes of Dupixent (6 pens) today during his OV with Dr. Maple Hudson.  Lot# [id number] Exp: 09/06/2024

## 2023-02-18 NOTE — Assessment & Plan Note (Signed)
Headed for another exacerbation. Key is that he has not been able to use Dupixent or Breztri.  Plan- Tramadol for cough, doxycycline, prednisone for maintenance until very stable. Samples Dupixent and Breztri.

## 2023-02-18 NOTE — Assessment & Plan Note (Signed)
Pulse is regular and normal on exam today.

## 2023-02-18 NOTE — Patient Instructions (Addendum)
Script sent for prednisone  Script sent for doxycycline  Script sent for tramadol for cough  Order samples x 3 Breztri inhaler    inhale 2 puffs then rinse mouth, twice daily  We will check with our pharmacy team to see if they have any samples of Dupixent.  We can keep November 18 appointment

## 2023-03-04 ENCOUNTER — Other Ambulatory Visit: Payer: Self-pay | Admitting: Neurology

## 2023-03-04 DIAGNOSIS — G250 Essential tremor: Secondary | ICD-10-CM

## 2023-03-27 NOTE — Progress Notes (Unsigned)
Patient will need to call Dupixent's copay card company for additional fund support if they can provide. This is recurrent issue for patient and he shoiuyld be familiar with process. Unfortunately any specialty medication (biologic) is going to encounter this problem because it is an inherent insurance structuring problem.   If no luck with copay card company, patient will need samples for remainder of calendar year  Chesley Mires, PharmD, MPH, BCPS, CPP Clinical Pharmacist (Rheumatology and Pulmonology)+   Patient ID: Drew Sandoval, male    DOB: 1975/09/14    MRN: [id number]  HPI M  former smoker followed for severe asthma/ COPD, chronic sinusitis, hyper-eosinophilia and hyper IgE, complicated by history SVT/cardioversion FENO 03/18/16- 100  High, indicates allergic asthma Office Spirometry 03/18/2016-severe obstructive airways disease FVC 2.62/51%, FEV1 1.30/31%, FEV1/FVC 0.50, FEF 25-75 0.61/14% Office Spirometry 01/25/17-severe obstructive airways disease. FVC 3.52/68%, FEV1 1.80/43%, ratio or 0.51, FEF 25-75% 0.76/18% IgE was too high for Xolair. He could not afford Nucala or allergy vaccine. CBC w diff 08/18/21- EOS H 17.6K/Ul Cinqair infusion@ [facility]- ordered 07/21/17 ----------------------------------------------------  02/18/23- 47 year old male former smoker (5.75 pk yrs) followed for severe Asthma/ COPD/ Dupixent, chronic Sinusitis, persistent Eosinophilia, hyper IgE-too high for Xolair, history SVT/cardioversion/ ablation, Kidney stone, Covid infection Jan, 2022, HTN, Tremor,  -Breztri, Dupixent, Singulair, Ventolin hfa, neb levalbuterol ED x3 in last month for asthma. Admission 9/27-9/28. ACT score 7 Had flu shot. Finished prednisone from last ED visit. Not using Dupixent- insurance not covering. See note from [facility]/pharmacy. Not using [facility]- pharmacy hasn't said it has come in yet. He feels he needs more antibiotic. Prednisone ends today, but does help. We are able to provide  Breztri samples and Dupixent samples today- short term. CXR 02/04/23- MPRESSION: Hyperinflation and central airway thickening compatible with bronchitis or reactive airways disease.  03/28/23- 47 year old male former smoker (5.75 pk yrs) followed for severe Asthma/ COPD/ Dupixent, chronic Sinusitis, persistent Eosinophilia, hyper IgE-too high for Xolair, history SVT/cardioversion/ ablation, Kidney stone, Covid infection Jan, 2022, HTN, Tremor,  -Breztri, Dupixent, Singulair, Ventolin hfa, neb levalbuterol ------Asthma Still coughing He has prednisone at home that he uses when he is having an exacerbation, otherwise he tries to stay off of it.  We discussed his medication options and we will try adding Ohtuvayre nebs.  Our pharmacy team may be able to help with this. His mother was a smoker who died of lung cancer.  He does not know how much she smoked but expressed concern that his breathing problems were similar.  We discussed chest x-ray from September bronchitis and reactive airways disease but no suspicion of lung mass.   ROS-see HPI  "+" = positive Constitutional:    weight loss, night sweats, fevers, chills, fatigue, lassitude. HEENT:    headaches, difficulty swallowing, tooth/dental problems, sore throat,       sneezing, itching, ear ache, +nasal congestion, +post nasal drip, snoring CV:    chest pain, orthopnea, PND, swelling in lower extremities, anasarca,  dizziness, +palpitations Resp:   +shortness of breath with exertion or at rest.                productive cough,  +non-productive cough, coughing up of blood.              change in color of mucus.  +wheezing.   Skin:    rash or lesions. GI:  No-   heartburn, indigestion, abdominal pain, nausea, vomiting,  GU:  MS:   joint pain, stiffness, decreased range of motion, back pain.  Neuro-     nothing unusual Psych:  change in mood or affect.  depression or anxiety.   memory loss.  Objective:   Physical Exam General- Alert,  Oriented, Affect-reserved/ laconic, Distress-none acute;  Tall, thin Skin- +tatoos Lymphadenopathy- none Head- atraumatic            Eyes- Gross vision intact, PERRLA, conjunctivae clear secretions            Ears- Hearing, canals normal            Nose- +clear, no-Septal dev, mucus, polyps, erosion, perforation             Throat- Mallampati II , mucosa clear , drainage- none, tonsils- atrophic Neck- flexible , trachea midline, no stridor , thyroid nl, carotid no bruit Chest - symmetrical excursion , unlabored           Heart/CV- RR/ +frequent early beats  , no murmur , no gallop  , no rub, nl s1 s2                           - JVD- none , edema- none, stasis changes- none, varices- none           Lung-   +Diminished/ clear, unlabored,  Cough+dry,  dullness-none, rub- none,  Wheeze-none Abd- scaphoid  Br/ Gen/ Rectal- Not done, not indicated Extrem- cyanosis- none, clubbing, none, atrophy- none, strength- nl Neuro- unremarkable

## 2023-03-28 ENCOUNTER — Telehealth: Payer: Self-pay

## 2023-03-28 ENCOUNTER — Encounter: Payer: Self-pay | Admitting: Internal Medicine

## 2023-03-28 ENCOUNTER — Ambulatory Visit (INDEPENDENT_AMBULATORY_CARE_PROVIDER_SITE_OTHER): Payer: BC Managed Care – PPO | Admitting: Internal Medicine

## 2023-03-28 VITALS — BP 133/89 | HR 89 | Temp 98.1°F | Ht 72.0 in | Wt 184.6 lb

## 2023-03-28 DIAGNOSIS — J4550 Severe persistent asthma, uncomplicated: Secondary | ICD-10-CM

## 2023-03-28 DIAGNOSIS — I471 Supraventricular tachycardia, unspecified: Secondary | ICD-10-CM | POA: Diagnosis not present

## 2023-03-28 MED ORDER — OHTUVAYRE 3 MG/2.5ML IN SUSP: 2.5000 mL | Freq: Two times a day (BID) | RESPIRATORY_TRACT | 2 refills | Status: DC

## 2023-03-28 NOTE — Assessment & Plan Note (Signed)
Notes some irregularity with frequent extra beats.  I do not think it is atrial fibrillation, more consistent with frequent PACs.  I drew it to his attention and he will follow-up with cardiology as needed.

## 2023-03-28 NOTE — Assessment & Plan Note (Signed)
He has prednisone that he takes if he thinks he is getting "bad"-discussed.  Reviewed medications. Plan-try adding Ohtuvayre nebulizer solution.

## 2023-03-28 NOTE — Telephone Encounter (Signed)
Received New start paperwork for [facility]. Will update as we work through the benefits process.  Submitted Patient Assistance Application to [employer] for [facility] along with provider portion, patient portion, PA, medication list, insurance card copy and income documents. Will update patient when we receive a response.  Phone#: [phone] Fax#: [phone]

## 2023-03-28 NOTE — Patient Instructions (Addendum)
Order- Pharmacy- try adding Ohtuvayre neb solution 1 amp twice daily by nebulizer.  Continue current meds

## 2023-04-04 NOTE — Telephone Encounter (Signed)
Received fax from [employer] that enrollment form has been received.  Patient ID: [id number] Patient's rx will be triaged to [facility](phone [phone])  Chesley Mires, PharmD, MPH, BCPS, CPP Clinical Pharmacist (Rheumatology and Pulmonology)

## 2023-04-13 NOTE — Telephone Encounter (Signed)
Received fax stating that a PA request had been initiated via [facility].  Completed and submitted a Prior Authorization request to [employer] [place]  for [facility] via CoverMyMeds. Will update once we receive a response.  Key: [id number]

## 2023-04-14 ENCOUNTER — Ambulatory Visit
Admission: RE | Admit: 2023-04-14 | Discharge: 2023-04-14 | Disposition: A | Discharge status: Home / Self Care | Payer: BC Managed Care – PPO | Source: Ambulatory Visit | Attending: Family Medicine | Admitting: Family Medicine

## 2023-04-14 VITALS — BP 127/78 | HR 76 | Temp 97.9°F | Resp 16

## 2023-04-14 DIAGNOSIS — J4551 Severe persistent asthma with (acute) exacerbation: Secondary | ICD-10-CM

## 2023-04-14 MED ORDER — PREDNISONE 10 MG PO TABS: ORAL_TABLET | ORAL | 0 refills | Status: DC

## 2023-04-14 MED ORDER — DEXAMETHASONE SODIUM PHOSPHATE 10 MG/ML IJ SOLN
10.0000 mg | Freq: Once | INTRAMUSCULAR | Status: AC
  Administered 2023-04-14: 10 mg via INTRAMUSCULAR

## 2023-04-14 NOTE — ED Provider Notes (Signed)
[facility]    CSN: [id number] Arrival date & time: 04/14/23  0945      History   Chief Complaint Chief Complaint  Patient presents with   Cough    Asthma short of breath and sinus congestion - Entered by patient    HPI Drew Sandoval is a 47 y.o. male.   Patient presenting today with 1 week history of progressively worsening chest tightness, wheezing, shortness of breath.  Denies fever, chills, congestion, sore throat, abdominal pain, nausea vomiting or diarrhea.  History of severe asthma on numerous inhaled medications as well as injectable medications and followed by pulmonology.  Has severe exacerbations several times a year sometimes requiring hospitalizations.  Most recent steroid course was about a month ago per patient.  Using inhalers with minimal relief at this point.    Past Medical History:  Diagnosis Date   Acute conjunctivitis of right eye 10/07/2014   Acute respiratory failure with hypoxia (HCC) 07/23/2013   Asthma    Asthma    Phreesia 06/26/2020   Asthma, severe persistent 07/16/2010   PFT >> spiro 2012 with FEV1 1.14 (28%), ratio 45 Alpha 1 (08/12/10) >>MM ([facility])  IGE 1747, positive allergy profile >> Elevated eos on CBC S/p intubation 10/2010 Job exposure to respiratory irritant - bleach, chicken Allergy vaccine begun 11/09/2010. DC'd by pt 2013- resumed 2013> 1:50 GH; DC'd -[facility] 9/24-9/27/ 2013- Status asthma, no vent. [facility] 3/ 2015- status ast   COVID-19 virus infection 06/17/2020   January, 2022. 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   SVT (supraventricular tachycardia) (HCC) 09/24/2014     Patient Active Problem List   Diagnosis Date Noted   Acute asthma exacerbation 02/04/2023   Acute bronchitis 12/24/2022   Aspiration pneumonia (HCC) 08/26/2021   Hematuria 06/11/2021   Periumbilical abdominal pain 06/11/2021   Thyroid nodule 11/18/2020   Essential tremor 06/27/2020   Hypertension, essential 06/17/2020   Gynecomastia, male 04/24/2018   Sustained SVT (HCC) 09/24/2014   SVT (supraventricular tachycardia) (HCC) 09/24/2014   Malnutrition of moderate degree (HCC) 09/24/2014   Seasonal and perennial allergic rhinitis 07/17/2014   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 Authorizing Provider  predniSONE (DELTASONE) 10 MG tablet Take 6 tabs daily x 2 days, 5 tabs daily x 2 days, 4 tabs daily x 2 days, etc 04/14/23  Yes Particia Nearing, PA-C  acetaminophen (TYLENOL) 325 MG tablet Take 2 tablets (650 mg total) by mouth every 6 (six) hours as needed for mild pain, headache or fever (or Fever >/= 101). 02/05/23   Almon Hercules, MD  acetaminophen (TYLENOL) 500 MG tablet Take 1,000  mg by mouth every 6 (six) hours as needed for moderate pain.    [provider]  albuterol (VENTOLIN HFA) 108 (90 Base) MCG/ACT inhaler Inhale 2 puffs into the lungs every 6 (six) hours as needed for wheezing or shortness of breath. 11/22/22   Leath-Warren, Sadie Haber, NP  amLODipine (NORVASC) 5 MG tablet TAKE 1 TABLET (5 MG TOTAL) BY MOUTH DAILY. Patient taking differently: Take 5 mg by mouth as needed (high blood  pressure). 02/12/22   Marinus Maw, MD  Budeson-Glycopyrrol-Formoterol (BREZTRI AEROSPHERE) 160-9-4.8 MCG/ACT AERO Inhale 2 puffs into the lungs 2 (two) times daily. Rinse mouth 12/24/22   Glenford Bayley, NP  doxycycline (VIBRA-TABS) 100 MG tablet Take 1 tablet (100 mg total) by mouth 2 (two) times daily. 02/18/23   Young, Joni Fears D, MD  Dupilumab (DUPIXENT) 300 MG/2ML SOPN INJECT 300 MG INTO THE SKIN EVERY 14 DAYS. 12/08/22   Young, Rennis Chris, MD  Ensifentrine (OHTUVAYRE) 3 MG/2.5ML SUSP Inhale 2.5 mLs into the lungs 2 (two) times daily. 03/28/23   Jetty Duhamel D, MD  EPINEPHrine 0.3 mg/0.3 mL IJ SOAJ injection Inject into thigh for severe allergic reaction 09/28/22   Jetty Duhamel D, MD  levalbuterol (XOPENEX) 0.31 MG/3ML nebulizer solution Take 3 mLs (0.31 mg total) by nebulization every 4 (four) hours as needed for wheezing. 09/24/22   Kommor, Madison, MD  montelukast (SINGULAIR) 10 MG tablet Take 1 tablet (10 mg total) by mouth at bedtime. Patient taking differently: Take 10 mg by mouth daily. 12/24/22   Glenford Bayley, NP  predniSONE (DELTASONE) 20 MG tablet Take 1 tablet (20 mg total) by mouth daily with breakfast. 02/18/23   Jetty Duhamel D, MD  primidone (MYSOLINE) 50 MG tablet TAKE 4 TABLETS TWICE A DAY 03/07/23   Tat, Octaviano Batty, DO  revefenacin (YUPELRI) 175 MCG/3ML nebulizer solution Take 3 mLs (175 mcg total) by nebulization daily. 09/28/22   [name], MD  traMADol Janean Sark) 50 MG tablet 1 or 2 every 8 hours if needed for  cough 02/18/23   [name], MD    Family History Family History  Problem Relation Age of Onset   Lung cancer Mother    Emphysema Father    Arthritis Sister    Diabetes Maternal Grandmother    Thyroid disease Neg Hx     Social History Social History   Tobacco Use   Smoking status: Former    Current packs/day: 0.00    Average packs/day: 0.5 packs/day for 11.5 years (5.8 ttl pk-yrs)    Types: Cigarettes    Start date: 10/09/1998     Quit date: 04/09/2010    Years since quitting: 13.0   Smokeless tobacco: Former   Tobacco comments:    started at age 60.  1/2 ppd.    Vaping Use   Vaping status: Never Used  Substance Use Topics   Alcohol use: Yes    Comment: rarely   Drug use: No     Allergies   Azithromycin, Banana, Alvesco [ciclesonide], Anoro ellipta [umeclidinium-vilanterol], Methylprednisolone, and Penicillins   Review of Systems Review of Systems Per HPI  Physical Exam Triage Vital Signs ED Triage Vitals  Encounter Vitals Group     BP 04/14/23 1015 127/78     Systolic BP Percentile --      Diastolic BP Percentile --      Pulse Rate 04/14/23 1015 76     Resp 04/14/23 1015 16     Temp 04/14/23 1015 97.9 F (  36.6 C)     Temp Source 04/14/23 1015 Oral     SpO2 04/14/23 1015 90 %     Weight --      Height --      Head Circumference --      Peak Flow --      Pain Score 04/14/23 1017 0     Pain Loc --      Pain Education --      Exclude from Growth Chart --    No data found.  Updated Vital Signs BP 127/78 (BP Location: Right Arm)   Pulse 76   Temp 97.9 F (36.6 C) (Oral)   Resp 16   SpO2 90%   Visual Acuity Right Eye Distance:   Left Eye Distance:   Bilateral Distance:    Right Eye Near:   Left Eye Near:    Bilateral Near:     Physical Exam Vitals and nursing note reviewed.  Constitutional:      Appearance: Normal appearance.  HENT:     Head: Atraumatic.     Mouth/Throat:     Mouth: Mucous membranes are moist.     Pharynx: Oropharynx is clear. No posterior oropharyngeal erythema.  Eyes:     Extraocular Movements: Extraocular movements intact.     Conjunctiva/sclera: Conjunctivae normal.  Cardiovascular:     Rate and Rhythm: Normal rate and regular rhythm.  Pulmonary:     Effort: Pulmonary effort is normal.     Breath sounds: Wheezing present.     Comments: Breathing comfortably on room air, speaking in full sentences. Musculoskeletal:        General: Normal range of  motion.     Cervical back: Normal range of motion and neck supple.  Skin:    General: Skin is warm and dry.  Neurological:     General: No focal deficit present.     Mental Status: He is oriented to person, place, and time.  Psychiatric:        Mood and Affect: Mood normal.        Thought Content: Thought content normal.        Judgment: Judgment normal.    UC Treatments / Results  Labs (all labs ordered are listed, but only abnormal results are displayed) Labs Reviewed - No data to display  EKG   Radiology No results found.  Procedures Procedures (including critical care time)  Medications Ordered in UC Medications  dexamethasone (DECADRON) injection 10 mg (10 mg Intramuscular Given 04/14/23 1033)    Initial Impression / Assessment and Plan / UC Course  I have reviewed the triage vital signs and the nursing notes.  Pertinent labs & imaging results that were available during my care of the patient were reviewed by me and considered in my medical decision making (see chart for details).     Oxygen saturation 90% on room air but breathing comfortably and in no acute distress.  This is a chronic issue with frequent exacerbations.  Will treat with IM Decadron, which he tolerates well and also an extended course of prednisone taper down in addition to inhaler regimen.  Discussed emergency department for worsening symptoms.  Final diagnoses:  Severe persistent asthma with acute exacerbation   Discharge Instructions   None    ED Prescriptions     Medication Sig Dispense Auth. Provider   predniSONE (DELTASONE) 10 MG tablet Take 6 tabs daily x 2 days, 5 tabs daily x 2 days, 4 tabs daily x 2 days,  etc 42 tablet Particia Nearing, [place]      PDMP not reviewed this encounter.   Particia Nearing, [place] 04/14/23 1105

## 2023-04-14 NOTE — ED Triage Notes (Signed)
Pt reports cough and shortness of breath pt has this of asthma, pt has used inhalers and nebs states it helps for a few hours and then gets worse then before. X 1 week

## 2023-04-15 NOTE — Telephone Encounter (Signed)
Received notification from [facility]  regarding a prior authorization for [facility]. Authorization has been APPROVED from 03/15/2023 to 04/13/2024.  Approval letter has not yet been received. PA submission automatically initiated with main clinic fax number so it is likely that it was placed in Dr. Roxy Cedar box.  Authorization # [id number]

## 2023-04-20 ENCOUNTER — Other Ambulatory Visit: Payer: Self-pay

## 2023-04-20 ENCOUNTER — Encounter (HOSPITAL_COMMUNITY): Payer: Self-pay

## 2023-04-20 ENCOUNTER — Emergency Department (HOSPITAL_COMMUNITY): Payer: BC Managed Care – PPO

## 2023-04-20 ENCOUNTER — Observation Stay (HOSPITAL_COMMUNITY)
Admission: EM | Admit: 2023-04-20 | Discharge: 2023-04-20 | Discharge status: Against Medical Advice | Payer: BC Managed Care – PPO | Attending: Internal Medicine | Admitting: Internal Medicine

## 2023-04-20 DIAGNOSIS — J181 Lobar pneumonia, unspecified organism: Secondary | ICD-10-CM | POA: Diagnosis not present

## 2023-04-20 DIAGNOSIS — R051 Acute cough: Secondary | ICD-10-CM | POA: Diagnosis not present

## 2023-04-20 DIAGNOSIS — I10 Essential (primary) hypertension: Secondary | ICD-10-CM | POA: Diagnosis present

## 2023-04-20 DIAGNOSIS — J189 Pneumonia, unspecified organism: Secondary | ICD-10-CM | POA: Diagnosis not present

## 2023-04-20 DIAGNOSIS — D824 Hyperimmunoglobulin E [IgE] syndrome: Secondary | ICD-10-CM | POA: Diagnosis present

## 2023-04-20 DIAGNOSIS — J45909 Unspecified asthma, uncomplicated: Secondary | ICD-10-CM | POA: Insufficient documentation

## 2023-04-20 DIAGNOSIS — Z8616 Personal history of COVID-19: Secondary | ICD-10-CM | POA: Insufficient documentation

## 2023-04-20 DIAGNOSIS — I471 Supraventricular tachycardia, unspecified: Secondary | ICD-10-CM | POA: Diagnosis present

## 2023-04-20 DIAGNOSIS — R0902 Hypoxemia: Secondary | ICD-10-CM

## 2023-04-20 DIAGNOSIS — J9601 Acute respiratory failure with hypoxia: Secondary | ICD-10-CM | POA: Diagnosis present

## 2023-04-20 DIAGNOSIS — J90 Pleural effusion, not elsewhere classified: Secondary | ICD-10-CM | POA: Diagnosis not present

## 2023-04-20 DIAGNOSIS — G250 Essential tremor: Secondary | ICD-10-CM | POA: Diagnosis present

## 2023-04-20 DIAGNOSIS — Z79899 Other long term (current) drug therapy: Secondary | ICD-10-CM | POA: Diagnosis not present

## 2023-04-20 DIAGNOSIS — E041 Nontoxic single thyroid nodule: Secondary | ICD-10-CM | POA: Insufficient documentation

## 2023-04-20 DIAGNOSIS — J40 Bronchitis, not specified as acute or chronic: Secondary | ICD-10-CM | POA: Diagnosis not present

## 2023-04-20 DIAGNOSIS — Z87891 Personal history of nicotine dependence: Secondary | ICD-10-CM | POA: Insufficient documentation

## 2023-04-20 DIAGNOSIS — Z1152 Encounter for screening for COVID-19: Secondary | ICD-10-CM | POA: Diagnosis not present

## 2023-04-20 DIAGNOSIS — R918 Other nonspecific abnormal finding of lung field: Secondary | ICD-10-CM | POA: Diagnosis not present

## 2023-04-20 LAB — BASIC METABOLIC PANEL
Anion gap: 8 (ref 5–15)
BUN: 15 mg/dL (ref 6–20)
CO2: 23 mmol/L (ref 22–32)
Calcium: 8.8 mg/dL — ABNORMAL LOW (ref 8.9–10.3)
Chloride: 104 mmol/L (ref 98–111)
Creatinine, Ser: 0.95 mg/dL (ref 0.61–1.24)
GFR, Estimated: >60 mL/min (ref >60)
Glucose, Bld: 96 mg/dL (ref 70–99)
Potassium: 3.5 mmol/L (ref 3.5–5.1)
Sodium: 135 mmol/L (ref 135–145)

## 2023-04-20 LAB — CBC WITH DIFFERENTIAL/PLATELET
Abs Immature Granulocytes: 0 10*3/uL (ref 0.00–0.07)
Band Neutrophils: 1 %
Basophils Absolute: 0.3 10*3/uL — ABNORMAL HIGH (ref 0.0–0.1)
Basophils Relative: 2 %
Eosinophils Absolute: 3 10*3/uL — ABNORMAL HIGH (ref 0.0–0.5)
Eosinophils Relative: 22 %
HCT: 40 % (ref 39.0–52.0)
Hemoglobin: 13.4 g/dL (ref 13.0–17.0)
Lymphocytes Relative: 28 %
Lymphs Abs: 3.9 10*3/uL (ref 0.7–4.0)
MCH: 30.7 pg (ref 26.0–34.0)
MCHC: 33.5 g/dL (ref 30.0–36.0)
MCV: 91.7 fL (ref 80.0–100.0)
Monocytes Absolute: 0.6 10*3/uL (ref 0.1–1.0)
Monocytes Relative: 4 %
Neutro Abs: 6.1 10*3/uL (ref 1.7–7.7)
Neutrophils Relative %: 43 %
Platelets: 268 10*3/uL (ref 150–400)
RBC: 4.36 MIL/uL (ref 4.22–5.81)
RDW: 14.3 % (ref 11.5–15.5)
WBC: 13.8 10*3/uL — ABNORMAL HIGH (ref 4.0–10.5)
nRBC: 0 % (ref 0.0–0.2)

## 2023-04-20 LAB — RESP PANEL BY RT-PCR (RSV, FLU A&B, COVID)  RVPGX2
Influenza A by PCR: NEGATIVE
Influenza B by PCR: NEGATIVE
Resp Syncytial Virus by PCR: NEGATIVE
SARS Coronavirus 2 by RT PCR: NEGATIVE

## 2023-04-20 MED ORDER — LEVOFLOXACIN 750 MG PO TABS: 750.0000 mg | ORAL_TABLET | Freq: Every day | ORAL | 0 refills | Status: DC

## 2023-04-20 MED ORDER — LEVOFLOXACIN IN D5W 750 MG/150ML IV SOLN
750.0000 mg | Freq: Once | INTRAVENOUS | Status: AC
  Administered 2023-04-20: 750 mg via INTRAVENOUS
  Filled 2023-04-20: qty 150

## 2023-04-20 MED ORDER — PREDNISONE 50 MG PO TABS: 50.0000 mg | ORAL_TABLET | Freq: Every day | ORAL | 0 refills | Status: DC

## 2023-04-20 MED ORDER — IOHEXOL 300 MG/ML  SOLN
75.0000 mL | Freq: Once | INTRAMUSCULAR | Status: AC | PRN
  Administered 2023-04-20: 75 mL via INTRAVENOUS

## 2023-04-20 NOTE — ED Provider Notes (Addendum)
EMERGENCY DEPARTMENT AT [facility] Provider Note   CSN: [id number] Arrival date & time: 04/20/23  5366     History  Chief Complaint  Patient presents with   Cough    Drew Sandoval is a 47 y.o. male.  Patient with known history of fairly significant asthma.  Has had chest x-rays in the past that have been consistent with COPD.  Patient for the past month has had difficulty with wheezing and a productive cough he thinks that he has an infection.  Patient quit smoking in 2011.  Patient followed by pulmonary medicine.  Patient's had some posttussive vomiting.  But no true nausea vomiting or diarrhea.  Patient states that the of sputum is green in nature.  In addition patient states his throat is sore but he thinks it is from due to all the coughing.  Patient seen at urgent care on December 5 and started on prednisone taper.  Initial oxygen saturation here recorded at 90%.  Will follow-up on that.  Past medical history significant for the asthma supraventricular tachycardia hypertension.       Home Medications Prior to Admission medications   Medication Sig Start Date End Date Taking? Authorizing Provider  benzonatate (TESSALON) 100 MG capsule Take 100 mg by mouth 3 (three) times daily as needed. 02/10/23  Yes [provider]  acetaminophen (TYLENOL) 325 MG tablet Take 2 tablets (650 mg total) by mouth every 6 (six) hours as needed for mild pain, headache or fever (or Fever >/= 101). 02/05/23   Almon Hercules, MD  acetaminophen (TYLENOL) 500 MG tablet Take 1,000 mg by mouth every 6 (six) hours as needed for moderate pain.    [provider]  albuterol (VENTOLIN HFA) 108 (90 Base) MCG/ACT inhaler Inhale 2 puffs into the lungs every 6 (six) hours as needed for wheezing or shortness of breath. 11/22/22   Leath-Warren, Sadie Haber, NP  amLODipine (NORVASC) 5 MG tablet TAKE 1 TABLET (5 MG TOTAL) BY MOUTH DAILY. Patient taking differently: Take 5 mg by  mouth as needed (high blood pressure). 02/12/22   Marinus Maw, MD  Budeson-Glycopyrrol-Formoterol (BREZTRI AEROSPHERE) 160-9-4.8 MCG/ACT AERO Inhale 2 puffs into the lungs 2 (two) times daily. Rinse mouth 12/24/22   Glenford Bayley, NP  doxycycline (VIBRA-TABS) 100 MG tablet Take 1 tablet (100 mg total) by mouth 2 (two) times daily. 02/18/23   Young, Joni Fears D, MD  Dupilumab (DUPIXENT) 300 MG/2ML SOPN INJECT 300 MG INTO THE SKIN EVERY 14 DAYS. 12/08/22   Young, Rennis Chris, MD  Ensifentrine (OHTUVAYRE) 3 MG/2.5ML SUSP Inhale 2.5 mLs into the lungs 2 (two) times daily. 03/28/23   Jetty Duhamel D, MD  EPINEPHrine 0.3 mg/0.3 mL IJ SOAJ injection Inject into thigh for severe allergic reaction 09/28/22   Jetty Duhamel D, MD  levalbuterol (XOPENEX) 0.31 MG/3ML nebulizer solution Take 3 mLs (0.31 mg total) by nebulization every 4 (four) hours as needed for wheezing. 09/24/22   Kommor, Madison, MD  montelukast (SINGULAIR) 10 MG tablet Take 1 tablet (10 mg total) by mouth at bedtime. Patient taking differently: Take 10 mg by mouth daily. 12/24/22   Glenford Bayley, NP  predniSONE (DELTASONE) 10 MG tablet Take 6 tabs daily x 2 days, 5 tabs daily x 2 days, 4 tabs daily x 2 days, etc 04/14/23   Particia Nearing, PA-C  predniSONE (DELTASONE) 20 MG tablet Take 1 tablet (20 mg total) by mouth daily with breakfast. 02/18/23   Jetty Duhamel D,  MD  primidone (MYSOLINE) 50 MG tablet TAKE 4 TABLETS TWICE A DAY 03/07/23   Tat, Octaviano Batty, DO  revefenacin (YUPELRI) 175 MCG/3ML nebulizer solution Take 3 mLs (175 mcg total) by nebulization daily. 09/28/22   [name], MD  traMADol Janean Sark) 50 MG tablet 1 or 2 every 8 hours if needed for  cough 02/18/23   Jetty Duhamel D, MD      Allergies    Azithromycin, Banana, Alvesco [ciclesonide], Anoro ellipta [umeclidinium-vilanterol], Methylprednisolone, and Penicillins    Review of Systems   Review of Systems  Constitutional:  Positive for chills. Negative for  fever.  HENT:  Negative for ear pain and sore throat.   Eyes:  Negative for pain and visual disturbance.  Respiratory:  Positive for cough and wheezing. Negative for shortness of breath.   Cardiovascular:  Negative for chest pain and palpitations.  Gastrointestinal:  Negative for abdominal pain, diarrhea, nausea and vomiting.  Genitourinary:  Negative for dysuria and hematuria.  Musculoskeletal:  Negative for arthralgias, back pain and myalgias.  Skin:  Negative for color change and rash.  Neurological:  Negative for seizures and syncope.  All other systems reviewed and are negative.   Physical Exam Updated Vital Signs BP 123/79   Pulse 82   Temp 98.5 F (36.9 C)   Resp 17   Ht 1.905 m (6\' 3" )   Wt 84.8 kg   SpO2 90%   BMI 23.37 kg/m  Physical Exam Vitals and nursing note reviewed.  Constitutional:      General: He is not in acute distress.    Appearance: Normal appearance. He is well-developed.  HENT:     Head: Normocephalic and atraumatic.  Eyes:     Conjunctiva/sclera: Conjunctivae normal.     Pupils: Pupils are equal, round, and reactive to light.  Cardiovascular:     Rate and Rhythm: Normal rate and regular rhythm.     Heart sounds: No murmur heard. Pulmonary:     Effort: Pulmonary effort is normal. No respiratory distress.     Breath sounds: No stridor. Wheezing present. No rhonchi or rales.     Comments: Faint wheezing. Chest:     Chest wall: No tenderness.  Abdominal:     Palpations: Abdomen is soft.     Tenderness: There is no abdominal tenderness.  Musculoskeletal:        General: No swelling.     Cervical back: Neck supple.  Skin:    General: Skin is warm and dry.     Capillary Refill: Capillary refill takes less than 2 seconds.  Neurological:     General: No focal deficit present.     Mental Status: He is alert and oriented to person, place, and time.  Psychiatric:        Mood and Affect: Mood normal.     ED Results / Procedures / Treatments    Labs (all labs ordered are listed, but only abnormal results are displayed) Labs Reviewed  CBC WITH DIFFERENTIAL/PLATELET - Abnormal; Notable for the following components:      Result Value   WBC 13.8 (*)    Eosinophils Absolute 3.0 (*)    Basophils Absolute 0.3 (*)    All other components within normal limits  BASIC METABOLIC PANEL - Abnormal; Notable for the following components:   Calcium 8.8 (*)    All other components within normal limits  RESP PANEL BY RT-PCR (RSV, FLU A&B, COVID)  RVPGX2    EKG None  Radiology CT  Chest W Contrast  Result Date: 04/20/2023 CLINICAL DATA:  Pneumonia with complication suspected. EXAM: CT CHEST WITH CONTRAST TECHNIQUE: Multidetector CT imaging of the chest was performed during intravenous contrast administration. RADIATION DOSE REDUCTION: This exam was performed according to the departmental dose-optimization program which includes automated exposure control, adjustment of the mA and/or kV according to patient size and/or use of iterative reconstruction technique. CONTRAST:  75mL OMNIPAQUE IOHEXOL 300 MG/ML  SOLN COMPARISON:  11/12/2020 CT of the neck. 11/25/2020 thyroid ultrasound FINDINGS: Cardiovascular: Normal heart size. No pericardial effusion. No acute vascular finding. Mediastinum/Nodes: Mild prominence of hilar lymphatic tissue considered reactive. No worrisome mediastinal lymph node. There is a left lobe thyroid nodule measuring 3.8 cm with growth and further mass effect on the trachea when compared to 2022. Thyroid biopsy was recommended in 2022. Lungs/Pleura: Generalized airway thickening with airspace opacity at the lung bases, middle lobe, and lingula. Incomplete fissure dividing the left upper lobe at the level of the lingula. There is diffuse interlobular septal thickening and generalized patchy ground-glass density which may relate to the patient's asthma history. No convincing pulmonary edema. No pleural fluid or air leak. No cavitation.  Upper Abdomen: Bilateral renal cystic densities which are non worrisome. No acute finding. Musculoskeletal: No acute finding IMPRESSION: Generalized bronchitis with multifocal pneumonia especially affecting the lung bases. 3.8 cm left thyroid nodule with mild growth and tracheal mass effect since 2022. Biopsy was recommended by ultrasound in 2022, but not visibly completed in the patient's imaging chart. Recommend completed follow-up if not previously performed. Electronically Signed   By: Tiburcio Pea M.D.   On: 04/20/2023 10:36   DG Chest Port 1 View  Result Date: 04/20/2023 CLINICAL DATA:  47 year old male with possible pneumonia. EXAM: PORTABLE CHEST 1 VIEW COMPARISON:  Chest x-ray 02/04/2023. FINDINGS: Diffuse interstitial prominence and widespread peribronchial cuffing with patchy ill-defined opacities throughout the lung bases bilaterally concerning for probable bronchitis and developing bronchopneumonia. Small left pleural effusion. No definite right pleural effusion. No pneumothorax. No evidence of pulmonary edema. Heart size is normal. Upper mediastinal contours are within normal limits. IMPRESSION: 1. Findings are concerning for probable bronchitis and developing bronchopneumonia, most severe in the lung bases. 2. Small left pleural effusion. Electronically Signed   By: Trudie Reed M.D.   On: 04/20/2023 07:49    Procedures Procedures    Medications Ordered in ED Medications  iohexol (OMNIPAQUE) 300 MG/ML solution 75 mL (75 mLs Intravenous Contrast Given 04/20/23 0941)    ED Course/ Medical Decision Making/ A&P                                 Medical Decision Making Amount and/or Complexity of Data Reviewed Labs: ordered. Radiology: ordered.  Risk Prescription drug management. Decision regarding hospitalization.   Patient nontoxic no acute distress.  Some faint bilateral wheezing.  Will get chest x-ray patient is concerned about infectious process so we will screen  with respiratory panel.  Patient still on prednisone he says 1 tablet a day which would tend to mean 10 mg at this point.  Chest x-ray raises concerns for bronchitis as well as bronchopneumonia.  Will get some basic labs will do continuous pulse ox and will get CT chest with for further evaluation.  CRITICAL CARE Performed by: Vanetta Mulders Total critical care time: 40 minutes Critical care time was exclusive of separately billable procedures and treating other patients. Critical care was necessary to treat  or prevent imminent or life-threatening deterioration. Critical care was time spent personally by me on the following activities: development of treatment plan with patient and/or surrogate as well as nursing, discussions with consultants, evaluation of patient's response to treatment, examination of patient, obtaining history from patient or surrogate, ordering and performing treatments and interventions, ordering and review of laboratory studies, ordering and review of radiographic studies, pulse oximetry and re-evaluation of patient's condition.  Patient's oxygen levels are better on 2 L of oxygen.  CT consistent with multifocal pneumonia mostly in the lung bases.  Will start patient on antibiotics.  Patient is allergic to azithromycin and penicillins.  Penicillin allergy may be low just with rash.  But based on this we will start patient on Levaquin.  Respiratory panel negative CBC white count 13.8.  Patient panel renal function normal no significant electrolyte abnormalities.  Final Clinical Impression(s) / ED Diagnoses Final diagnoses:  Acute cough  Bronchitis  Hypoxia  Multifocal pneumonia    Rx / DC Orders ED Discharge Orders     None         Vanetta Mulders, MD 04/20/23 1914    Vanetta Mulders, MD 04/20/23 0740    Vanetta Mulders, MD 04/20/23 7829    Vanetta Mulders, MD 04/20/23 1115    Vanetta Mulders, MD 04/20/23 1117    Vanetta Mulders,  MD 04/20/23 1122

## 2023-04-20 NOTE — ED Notes (Signed)
Patient placed on 1L nasal canula for o2 sat dropping to 88. MD made aware.

## 2023-04-20 NOTE — Discharge Summary (Signed)
Physician Discharge Summary   Patient: Drew Sandoval MRN: [id number] DOB: 07/19/1975  Admit date:     05/06/2023  Discharge date: May 06, 2023  Discharge Physician: Onalee Hua Breeley Bischof   PCP: Pcp, No   Leaving AGAINST MEDICAL ADVICE  Discharge Diagnoses: Active Problems:   Hyper-IgE syndrome (HCC)   SVT (supraventricular tachycardia) (HCC)   Hypertension, essential   Essential tremor   Acute respiratory failure with hypoxia (HCC)   Lobar pneumonia [facility])    Hospital Course: 47 year old male with a history of severe persistent asthma, hyper IgE syndrome, SVT status post ablation, hypertension, essential tremor presenting with at least 1 week history of shortness of breath and coughing with yellow-green sputum.  The patient went to urgent care on 04/14/2023.  The patient was given IM injection of dexamethasone and sent home with prednisone taper.  He is currently taking prednisone 10 mg daily.  He continues to use his nebulizer machine and Breztri but his shortness of breath and coughing continues to persist.  He denies any fevers, chills, nausea, vomiting, diarrhea, abdominal pain.  He has substernal chest discomfort last exacerbated by coughing.  He denies any hemoptysis.  He does not smoke. In the ED, the patient was afebrile and hemodynamically stable with oxygen saturation 88% on room air.  WBC 13.8, hemoglobin 13 before, platelet 286.  Sodium 135, potassium 3.5, bicarbonate 23, serum creatinine 0.95.  COVID-19 PCR is negative.  CT chest showed airway thickening with airspace opacity at the lung bases, middle lobe and lingula.  There is diffuse interlobular septal thickening and generalized GGO.  There is a 3.8 cm left thyroid nodule with mild growth and tracheal mass effect since 2022.  The patient was given levofloxacin and admitted for further evaluation and treatment.  Later in the day on 05-06-2023, the patient wanted to be discharged due to social circumstance.  He was still requiring oxygen  supplementation.   In speaking with Drew Sandoval, he/she demonstrated the ability to understand the serious nature and consequences of leaving the hospital prior to completion of an adequate course of treatment. He/She was able to articulate the consequences of leaving prior to completion of adequate therapy, and he/she understood that leaving could result in bodily harm or even death.    On 2023-05-06, I was informed that Drew Sandoval wished to leave the hospital against medical advice. Drew Sandoval stated that he/she wished to leave the hospital prior to completing medical therapy because he/she felt much better, was able to eat and drink and was uncomfortable and did not want to stay. We dicussed with him/her the nature of his present illness, that he/she was being kept in the hospital due to ongoing active issues which included: dyspnea, pneumonia, new oxygen requirement, respiratory failure It was explained that inpatient therapy was still warranted because of the risk of recurrence of dyspnea, pneumonia, new oxygen requirement, respiratory failure and that the risks of leaving prior to completion of treatment and preparation of a safe discharge plan would be recurrence of dyspnea, pneumonia, new oxygen requirement, respiratory failurepossibly leading to end-organ damage ultimately leading to death. Ultimately, Drew Sandoval chose to forgo further treatment in the inpatient setting.      Assessment and Plan:  Acute respiratory failure with hypoxia -Secondary to pneumonia and asthma exacerbation -CT chest as discussed above -Presented with tachypnea and oxygen saturation 88% on room air -Stable on 2 L -Viral respiratory panel -Wean oxygen as tolerated   Lobar pneumonia -continue levoflox -check PCT   Asthma Exacerbation--severe persistent  asthma -started xopenex -started pulmicort -continue singulair -start IV solumedrol   Essential Tremor -continue mysoline    Hypertension -continue amlodipine   Thyroid Nodule -needs outpt follow up and ultrasound   Hx SVT s/p ablation -currently in sinus      Consultants: none Procedures performed: none  Disposition: Home Diet recommendation:  Cardiac diet DISCHARGE MEDICATION: Allergies as of 04/20/2023       Reactions   Azithromycin Rash   Banana Swelling   Throat swelling   Alvesco [ciclesonide] Rash   Possible reaction- rash   Anoro Ellipta [umeclidinium-vilanterol] Rash   Possible- rash   Methylprednisolone Other (See Comments)   unknown   Penicillins Rash        Medication List     STOP taking these medications    Dupixent 300 MG/2ML Soaj Generic drug: Dupilumab       TAKE these medications    acetaminophen 500 MG tablet Commonly known as: TYLENOL Take 1,000 mg by mouth every 6 (six) hours as needed for moderate pain.   acetaminophen 325 MG tablet Commonly known as: TYLENOL Take 2 tablets (650 mg total) by mouth every 6 (six) hours as needed for mild pain, headache or fever (or Fever >/= 101).   albuterol 108 (90 Base) MCG/ACT inhaler Commonly known as: VENTOLIN HFA Inhale 2 puffs into the lungs every 6 (six) hours as needed for wheezing or shortness of breath.   amLODipine 5 MG tablet Commonly known as: NORVASC TAKE 1 TABLET (5 MG TOTAL) BY MOUTH DAILY. What changed:  when to take this reasons to take this   Breztri Aerosphere 160-9-4.8 MCG/ACT Aero Generic drug: Budeson-Glycopyrrol-Formoterol Inhale 2 puffs into the lungs 2 (two) times daily. Rinse mouth   EPINEPHrine 0.3 mg/0.3 mL Soaj injection Commonly known as: EPI-PEN Inject into thigh for severe allergic reaction   levalbuterol 0.31 MG/3ML nebulizer solution Commonly known as: XOPENEX Take 3 mLs (0.31 mg total) by nebulization every 4 (four) hours as needed for wheezing.   levofloxacin 750 MG tablet Commonly known as: LEVAQUIN Take 1 tablet (750 mg total) by mouth daily. Start taking on:  April 21, 2023   montelukast 10 MG tablet Commonly known as: Singulair Take 1 tablet (10 mg total) by mouth at bedtime. What changed: when to take this   Ohtuvayre 3 MG/2.5ML Susp Generic drug: Ensifentrine Inhale 2.5 mLs into the lungs 2 (two) times daily.   predniSONE 50 MG tablet Commonly known as: DELTASONE Take 1 tablet (50 mg total) by mouth daily with breakfast. Start taking on: April 21, 2023 What changed:  medication strength how much to take how to take this when to take this additional instructions   primidone 50 MG tablet Commonly known as: MYSOLINE TAKE 4 TABLETS TWICE A DAY        Discharge Exam: Filed Weights   04/20/23 0727  Weight: 84.8 kg   HEENT:  [place]/AT, No thrush, no icterus CV:  RRR, no rub, no S3, no S4 Lung:  bibasilar rales.  Bibasilar wheeze Abd:  soft/+BS, NT Ext:  No edema, no lymphangitis, no synovitis, no rash   Condition at discharge: AGAINST MEDICAL ADVICE  The results of significant diagnostics from this hospitalization (including imaging, microbiology, ancillary and laboratory) are listed below for reference.   Imaging Studies: CT Chest W Contrast  Result Date: 04/20/2023 CLINICAL DATA:  Pneumonia with complication suspected. EXAM: CT CHEST WITH CONTRAST TECHNIQUE: Multidetector CT imaging of the chest was performed during intravenous contrast administration. RADIATION DOSE REDUCTION:  This exam was performed according to the departmental dose-optimization program which includes automated exposure control, adjustment of the mA and/or kV according to patient size and/or use of iterative reconstruction technique. CONTRAST:  75mL OMNIPAQUE IOHEXOL 300 MG/ML  SOLN COMPARISON:  11/12/2020 CT of the neck. 11/25/2020 thyroid ultrasound FINDINGS: Cardiovascular: Normal heart size. No pericardial effusion. No acute vascular finding. Mediastinum/Nodes: Mild prominence of hilar lymphatic tissue considered reactive. No worrisome mediastinal  lymph node. There is a left lobe thyroid nodule measuring 3.8 cm with growth and further mass effect on the trachea when compared to 2022. Thyroid biopsy was recommended in 2022. Lungs/Pleura: Generalized airway thickening with airspace opacity at the lung bases, middle lobe, and lingula. Incomplete fissure dividing the left upper lobe at the level of the lingula. There is diffuse interlobular septal thickening and generalized patchy ground-glass density which may relate to the patient's asthma history. No convincing pulmonary edema. No pleural fluid or air leak. No cavitation. Upper Abdomen: Bilateral renal cystic densities which are non worrisome. No acute finding. Musculoskeletal: No acute finding IMPRESSION: Generalized bronchitis with multifocal pneumonia especially affecting the lung bases. 3.8 cm left thyroid nodule with mild growth and tracheal mass effect since 2022. Biopsy was recommended by ultrasound in 2022, but not visibly completed in the patient's imaging chart. Recommend completed follow-up if not previously performed. Electronically Signed   By: Tiburcio Pea M.D.   On: 04/20/2023 10:36   DG Chest Port 1 View  Result Date: 04/20/2023 CLINICAL DATA:  47 year old male with possible pneumonia. EXAM: PORTABLE CHEST 1 VIEW COMPARISON:  Chest x-ray 02/04/2023. FINDINGS: Diffuse interstitial prominence and widespread peribronchial cuffing with patchy ill-defined opacities throughout the lung bases bilaterally concerning for probable bronchitis and developing bronchopneumonia. Small left pleural effusion. No definite right pleural effusion. No pneumothorax. No evidence of pulmonary edema. Heart size is normal. Upper mediastinal contours are within normal limits. IMPRESSION: 1. Findings are concerning for probable bronchitis and developing bronchopneumonia, most severe in the lung bases. 2. Small left pleural effusion. Electronically Signed   By: Trudie Reed M.D.   On: 04/20/2023 07:49     Microbiology: Results for orders placed or performed during the hospital encounter of 04/20/23  Resp panel by RT-PCR (RSV, Flu A&B, Covid) Anterior Nasal Swab     Status: None   Collection Time: 04/20/23  7:38 AM   Specimen: Anterior Nasal Swab  Result Value Ref Range Status   SARS Coronavirus 2 by RT PCR NEGATIVE NEGATIVE Final    Comment: (NOTE) SARS-CoV-2 target nucleic acids are NOT DETECTED.  The SARS-CoV-2 RNA is generally detectable in upper respiratory specimens during the acute phase of infection. The lowest concentration of SARS-CoV-2 viral copies this assay can detect is 138 copies/mL. A negative result does not preclude SARS-Cov-2 infection and should not be used as the sole basis for treatment or other patient management decisions. A negative result may occur with  improper specimen collection/handling, submission of specimen other than nasopharyngeal swab, presence of viral mutation(s) within the areas targeted by this assay, and inadequate number of viral copies(<138 copies/mL). A negative result must be combined with clinical observations, patient history, and epidemiological information. The expected result is Negative.  Fact Sheet for Patients:  BloggerCourse.com  Fact Sheet for Healthcare Providers:  SeriousBroker.it  This test is no t yet approved or cleared by the [place] FDA and  has been authorized for detection and/or diagnosis of SARS-CoV-2 by FDA under an Emergency Use Authorization (EUA). This EUA will remain  in effect (meaning this test can be used) for the duration of the COVID-19 declaration under Section 564(b)(1) of the Act, 21 U.S.C.section 360bbb-3(b)(1), unless the authorization is terminated  or revoked sooner.       Influenza A by PCR NEGATIVE NEGATIVE Final   Influenza B by PCR NEGATIVE NEGATIVE Final    Comment: (NOTE) The Xpert Xpress SARS-CoV-2/FLU/RSV plus assay is intended  as an aid in the diagnosis of influenza from Nasopharyngeal swab specimens and should not be used as a sole basis for treatment. Nasal washings and aspirates are unacceptable for Xpert Xpress SARS-CoV-2/FLU/RSV testing.  Fact Sheet for Patients: BloggerCourse.com  Fact Sheet for Healthcare Providers: SeriousBroker.it  This test is not yet approved or cleared by the [place] FDA and has been authorized for detection and/or diagnosis of SARS-CoV-2 by FDA under an Emergency Use Authorization (EUA). This EUA will remain in effect (meaning this test can be used) for the duration of the COVID-19 declaration under Section 564(b)(1) of the Act, 21 U.S.C. section 360bbb-3(b)(1), unless the authorization is terminated or revoked.     Resp Syncytial Virus by PCR NEGATIVE NEGATIVE Final    Comment: (NOTE) Fact Sheet for Patients: BloggerCourse.com  Fact Sheet for Healthcare Providers: SeriousBroker.it  This test is not yet approved or cleared by the [place] FDA and has been authorized for detection and/or diagnosis of SARS-CoV-2 by FDA under an Emergency Use Authorization (EUA). This EUA will remain in effect (meaning this test can be used) for the duration of the COVID-19 declaration under Section 564(b)(1) of the Act, 21 U.S.C. section 360bbb-3(b)(1), unless the authorization is terminated or revoked.  Performed at [facility], [address], [place], [place] [address]    *Note: Due to a large number of results and/or encounters for the requested time period, some results have not been displayed. A complete set of results can be found in Results Review.    Labs: CBC: Recent Labs  Lab 04/20/23 0755  WBC 13.8*  NEUTROABS 6.1  HGB 13.4  HCT 40.0  MCV 91.7  PLT 268   Basic Metabolic Panel: Recent Labs  Lab 04/20/23 0755  NA 135  K 3.5  CL 104  CO2 23  GLUCOSE  96  BUN 15  CREATININE 0.95  CALCIUM 8.8*   Liver Function Tests: No results for input(s): "AST", "ALT", "ALKPHOS", "BILITOT", "PROT", "ALBUMIN" in the last 168 hours. CBG: No results for input(s): "GLUCAP" in the last 168 hours.  Discharge time spent: greater than 30 minutes.  Signed: Catarina Hartshorn, MD [facility] 04/20/2023

## 2023-04-20 NOTE — ED Notes (Signed)
Patient requesting to see provider, wanting to be discharged. Dr. Arbutus Leas made aware.

## 2023-04-20 NOTE — ED Notes (Signed)
Informed my MD patient still wanting to leave and will sign AMA form. Antibiotic stopped and patient signed AMA Form. IV remove and Nasal cannula removed from patient.

## 2023-04-20 NOTE — ED Triage Notes (Signed)
Patient complaint of cough, that causes sore throat and having chills. Went to urgent care on the 5th and was treated for ashma and given prendisone, patient states that did not help. Has had this cough since September. Noticed green mucus just started last week. Patient stated "Cough so much causing me to throw up".

## 2023-04-20 NOTE — Hospital Course (Addendum)
47 year old male with a history of severe persistent asthma, hyper IgE syndrome, SVT status post ablation, hypertension, essential tremor presenting with at least 1 week history of shortness of breath and coughing with yellow-green sputum.  The patient went to urgent care on 04/14/2023.  The patient was given IM injection of dexamethasone and sent home with prednisone taper.  He is currently taking prednisone 10 mg daily.  He continues to use his nebulizer machine and Breztri but his shortness of breath and coughing continues to persist.  He denies any fevers, chills, nausea, vomiting, diarrhea, abdominal pain.  He has substernal chest discomfort last exacerbated by coughing.  He denies any hemoptysis.  He does not smoke. In the ED, the patient was afebrile and hemodynamically stable with oxygen saturation 88% on room air.  WBC 13.8, hemoglobin 13 before, platelet 286.  Sodium 135, potassium 3.5, bicarbonate 23, serum creatinine 0.95.  COVID-19 PCR is negative.  CT chest showed airway thickening with airspace opacity at the lung bases, middle lobe and lingula.  There is diffuse interlobular septal thickening and generalized GGO.  There is a 3.8 cm left thyroid nodule with mild growth and tracheal mass effect since 2022.  The patient was given levofloxacin and admitted for further evaluation and treatment.  Later in the day on 2023/04/26, the patient wanted to be discharged due to social circumstance.  He was still requiring oxygen supplementation.   In speaking with Carlus Pavlov, he/she demonstrated the ability to understand the serious nature and consequences of leaving the hospital prior to completion of an adequate course of treatment. He/She was able to articulate the consequences of leaving prior to completion of adequate therapy, and he/she understood that leaving could result in bodily harm or even death.    On 04/26/23, I was informed that Mondrell Anglade wished to leave the hospital against medical  advice. Keric Lanclos stated that he/she wished to leave the hospital prior to completing medical therapy because he/she felt much better, was able to eat and drink and was uncomfortable and did not want to stay. We dicussed with him/her the nature of his present illness, that he/she was being kept in the hospital due to ongoing active issues which included: dyspnea, pneumonia, new oxygen requirement, respiratory failure It was explained that inpatient therapy was still warranted because of the risk of recurrence of dyspnea, pneumonia, new oxygen requirement, respiratory failure and that the risks of leaving prior to completion of treatment and preparation of a safe discharge plan would be recurrence of dyspnea, pneumonia, new oxygen requirement, respiratory failurepossibly leading to end-organ damage ultimately leading to death. Ultimately, Eris Halteman chose to forgo further treatment in the inpatient setting.

## 2023-04-20 NOTE — H&P (Signed)
History and Physical    Patient: Drew Sandoval UJW:[id number] DOB: Jun 08, 1975 DOA: 04/20/2023 DOS: the patient was seen and examined on 04/20/2023 PCP: Pcp, No  Patient coming from: Home  Chief Complaint:  Chief Complaint  Patient presents with   Cough   HPI: Drew Sandoval is a 47 year old male with a history of severe persistent asthma, hyper IgE syndrome, SVT status post ablation, hypertension, essential tremor presenting with at least 1 week history of shortness of breath and coughing with yellow-green sputum.  The patient went to urgent care on 04/14/2023.  The patient was given IM injection of dexamethasone and sent home with prednisone taper.  He is currently taking prednisone 10 mg daily.  He continues to use his nebulizer machine and Breztri but his shortness of breath and coughing continues to persist.  He denies any fevers, chills, nausea, vomiting, diarrhea, abdominal pain.  He has substernal chest discomfort last exacerbated by coughing.  He denies any hemoptysis.  He does not smoke. In the ED, the patient was afebrile and hemodynamically stable with oxygen saturation 88% on room air.  WBC 13.8, hemoglobin 13 before, platelet 286.  Sodium 135, potassium 3.5, bicarbonate 23, serum creatinine 0.95.  COVID-19 PCR is negative.  CT chest showed airway thickening with airspace opacity at the lung bases, middle lobe and lingula.  There is diffuse interlobular septal thickening and generalized GGO.  There is a 3.8 cm left thyroid nodule with mild growth and tracheal mass effect since 2022.  The patient was given levofloxacin and admitted for further evaluation and treatment.  Review of Systems: As mentioned in the history of present illness. All other systems reviewed and are negative. Past Medical History:  Diagnosis Date   Acute conjunctivitis of right eye 10/07/2014   Acute respiratory failure with hypoxia (HCC) 07/23/2013   Asthma    Asthma    Phreesia 06/26/2020   Asthma, severe  persistent 07/16/2010   PFT >> spiro 2012 with FEV1 1.14 (28%), ratio 45 Alpha 1 (08/12/10) >>MM ([employer] [facility])  IGE 1747, positive allergy profile >> Elevated eos on CBC S/p intubation 10/2010 Job exposure to respiratory irritant - bleach, chicken Allergy vaccine begun 11/09/2010. DC'd by pt 2013- resumed 2013> 1:50 GH; DC'd [facility] 9/24-9/27/ 2013- Status asthma, no vent. [facility]- status ast   COVID-19 virus infection 06/17/2020   January, 2022. 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   SVT (supraventricular tachycardia) (HCC) 09/24/2014   Past Surgical History:  Procedure Laterality Date   None     SVT ABLATION N/A 09/07/2019   Procedure: SVT ABLATION;  Surgeon: Marinus Maw, MD;  Location: [facility];  Service: Cardiovascular;  Laterality: N/A;   Social History:  reports that he quit smoking about 13 years ago. His smoking use included cigarettes. He started smoking about 24 years ago. He has a 5.8 pack-year smoking history. He has quit using smokeless tobacco. He reports current alcohol use. He reports that he does not use drugs.  Allergies  Allergen Reactions   Azithromycin Rash   Banana Swelling    Throat swelling    Alvesco [Ciclesonide] Rash  Possible reaction- rash   Anoro Ellipta [Umeclidinium-Vilanterol] Rash    Possible- rash   Methylprednisolone Other (See Comments)    unknown   Penicillins Rash    Family History  Problem Relation Age of Onset   Lung cancer Mother    Emphysema Father    Arthritis Sister    Diabetes Maternal Grandmother    Thyroid disease Neg Hx     Prior to Admission medications    Medication Sig Start Date End Date Taking? Authorizing Provider  acetaminophen (TYLENOL) 325 MG tablet Take 2 tablets (650 mg total) by mouth every 6 (six) hours as needed for mild pain, headache or fever (or Fever >/= 101). 02/05/23  Yes Almon Hercules, MD  acetaminophen (TYLENOL) 500 MG tablet Take 1,000 mg by mouth every 6 (six) hours as needed for moderate pain.   Yes [provider]  albuterol (VENTOLIN HFA) 108 (90 Base) MCG/ACT inhaler Inhale 2 puffs into the lungs every 6 (six) hours as needed for wheezing or shortness of breath. 11/22/22  Yes Leath-Warren, Sadie Haber, NP  amLODipine (NORVASC) 5 MG tablet TAKE 1 TABLET (5 MG TOTAL) BY MOUTH DAILY. Patient taking differently: Take 5 mg by mouth as needed (high blood pressure). 02/12/22  Yes Marinus Maw, MD  Budeson-Glycopyrrol-Formoterol (BREZTRI AEROSPHERE) 160-9-4.8 MCG/ACT AERO Inhale 2 puffs into the lungs 2 (two) times daily. Rinse mouth 12/24/22  Yes Glenford Bayley, NP  EPINEPHrine 0.3 mg/0.3 mL IJ SOAJ injection Inject into thigh for severe allergic reaction 09/28/22  Yes Young, Joni Fears D, MD  levalbuterol (XOPENEX) 0.31 MG/3ML nebulizer solution Take 3 mLs (0.31 mg total) by nebulization every 4 (four) hours as needed for wheezing. 09/24/22  Yes Kommor, Madison, MD  montelukast (SINGULAIR) 10 MG tablet Take 1 tablet (10 mg total) by mouth at bedtime. Patient taking differently: Take 10 mg by mouth daily. 12/24/22  Yes Glenford Bayley, NP  predniSONE (DELTASONE) 10 MG tablet Take 6 tabs daily x 2 days, 5 tabs daily x 2 days, 4 tabs daily x 2 days, etc 04/14/23  Yes Particia Nearing, PA-C  primidone (MYSOLINE) 50 MG tablet TAKE 4 TABLETS TWICE A DAY 03/07/23  Yes Va Broadwell, Rebecca S, DO  Dupilumab (DUPIXENT) 300 MG/2ML SOPN INJECT 300 MG INTO THE SKIN EVERY 14 DAYS. Patient not taking: Reported on 04/20/2023 12/08/22   [name], MD  Ensifentrine [facility]) 3 MG/2.5ML SUSP Inhale 2.5 mLs into the lungs 2 (two) times  daily. 03/28/23   [name], MD    Physical Exam: Vitals:   04/20/23 1115 04/20/23 1130 04/20/23 1137 04/20/23 1145  BP: 108/67 131/85  127/74  Pulse: 76 78  85  Resp:      Temp:   98.5 F (36.9 C)   TempSrc:   Oral   SpO2: 93% (!) 89%  91%  Weight:      Height:       GENERAL:  A&O x 3, NAD, well developed, cooperative, follows commands HEENT: [place]/AT, No thrush, No icterus, No oral ulcers Neck:  No neck mass, No meningismus, soft, supple CV: RRR, no S3, no S4, no rub, no JVD Lungs:  bibasilar rales.  Bilateral exp wheeze Abd: soft/NT +BS, nondistended Ext: No edema, no lymphangitis, no cyanosis, no rashes Neuro:  CN II-XII intact, strength 4/5 in RUE, RLE, strength 4/5 LUE, LLE; sensation intact bilateral; no dysmetria; babinski equivocal  Data Reviewed: Data reviewed   Assessment and Plan: Acute respiratory failure with hypoxia -Secondary to  pneumonia and asthma exacerbation -CT chest as discussed above -Presented with tachypnea and oxygen saturation 88% on room air -Stable on 2 L -Viral respiratory panel -Wean oxygen as tolerated  Lobar pneumonia -continue levoflox -check PCT  Asthma Exacerbation--severe persistent asthma -start xopenex -start pulmicort -continue singulair -start IV solumedrol  Essential Tremor -continue mysoline  Hypertension -continue amlodipine  Thyroid Nodule -needs outpt follow up and ultrasound  Hx SVT s/p ablation -currently in sinus      Advance Care Planning: FULL  Consults: none  Family Communication: none  Severity of Illness: The appropriate patient status for this patient is OBSERVATION. Observation status is judged to be reasonable and necessary in order to provide the required intensity of service to ensure the patient's safety. The patient's presenting symptoms, physical exam findings, and initial radiographic and laboratory data in the context of their medical condition is felt to place them at decreased  risk for further clinical deterioration. Furthermore, it is anticipated that the patient will be medically stable for discharge from the hospital within 2 midnights of admission.   Author: Catarina Hartshorn, MD 04/20/2023 11:59 AM  For on call review [email or web address].

## 2023-04-20 NOTE — ED Notes (Signed)
ED TO INPATIENT HANDOFF REPORT  ED Nurse Name and Phone #: Larina Bras Name/Age/Gender Drew Sandoval 47 y.o. male Room/Bed: APA12/APA12  Code Status   Code Status: Prior  Home/SNF/Other Home Patient oriented to: self, place, time, and situation Is this baseline? Yes   Triage Complete: Triage complete  Chief Complaint Acute respiratory failure with hypoxia (HCC) [J96.01]  Triage Note Patient complaint of cough, that causes sore throat and having chills. Went to urgent care on the 5th and was treated for ashma and given prendisone, patient states that did not help. Has had this cough since September. Noticed green mucus just started last week. Patient stated "Cough so much causing me to throw up".   Allergies Allergies  Allergen Reactions   Azithromycin Rash   Banana Swelling    Throat swelling    Alvesco [Ciclesonide] Rash    Possible reaction- rash   Anoro Ellipta [Umeclidinium-Vilanterol] Rash    Possible- rash   Methylprednisolone Other (See Comments)    unknown   Penicillins Rash    Level of Care/Admitting Diagnosis ED Disposition     ED Disposition  Admit   Condition  --   Comment  Hospital Area: [facility] [[id number]]  Level of Care: Med-Surg [16]  Covid Evaluation: Confirmed COVID Negative  Diagnosis: Acute respiratory failure with hypoxia [facility]) [[id number]]  Admitting Physician: TAT, DAVID Shoi-ming.Orion  Attending Physician: TAT, DAVID Shoi-ming.Orion          B Medical/Surgery History Past Medical History:  Diagnosis Date   Acute conjunctivitis of right eye 10/07/2014   Acute respiratory failure with hypoxia (HCC) 07/23/2013   Asthma    Asthma    Phreesia 06/26/2020   Asthma, severe persistent 07/16/2010   PFT >> spiro 2012 with FEV1 1.14 (28%), ratio 45 Alpha 1 (08/12/10) >>MM ([employer] [facility])  IGE 1747, positive allergy profile >> Elevated eos on CBC S/p intubation 10/2010 Job exposure to respiratory irritant - bleach, chicken Allergy vaccine  begun 11/09/2010. DC'd by pt 2013- resumed 2013> 1:50 GH; DC'd [facility] 9/24-9/27/ 2013- Status asthma, no vent. [facility]- status ast   COVID-19 virus infection 06/17/2020   January, 2022. 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   SVT (supraventricular tachycardia) (HCC) 09/24/2014   Past Surgical History:  Procedure Laterality Date   None     SVT ABLATION N/A 09/07/2019   Procedure: SVT ABLATION;  Surgeon: Marinus Maw, MD;  Location: [facility];  Service: Cardiovascular;  Laterality: N/A;     A IV Location/Drains/Wounds Patient Lines/Drains/Airways Status     Active Line/Drains/Airways     Name Placement date Placement time Site Days   Peripheral IV 04/20/23 20 G 1" Anterior;Left Forearm 04/20/23  0926  Forearm  less than 1            Intake/Output Last 24 hours No intake or output data in the 24 hours ending 04/20/23 1150  Labs/Imaging Results for orders placed or performed during the hospital encounter of 04/20/23 (from the past 48 hour(s))  Resp panel by RT-PCR (RSV, Flu A&B, Covid) Anterior Nasal Swab  Status: None   Collection Time: 04/20/23  7:38 AM   Specimen: Anterior Nasal Swab  Result Value Ref Range   SARS Coronavirus 2 by RT PCR NEGATIVE NEGATIVE    Comment: (NOTE) SARS-CoV-2 target nucleic acids are NOT DETECTED.  The SARS-CoV-2 RNA is generally detectable in upper respiratory specimens during the acute phase of infection. The lowest concentration of SARS-CoV-2 viral copies this assay can detect is 138 copies/mL. A negative result does not preclude SARS-Cov-2 infection and should not be used as the sole  basis for treatment or other patient management decisions. A negative result may occur with  improper specimen collection/handling, submission of specimen other than nasopharyngeal swab, presence of viral mutation(s) within the areas targeted by this assay, and inadequate number of viral copies(<138 copies/mL). A negative result must be combined with clinical observations, patient history, and epidemiological information. The expected result is Negative.  Fact Sheet for Patients:  BloggerCourse.com  Fact Sheet for Healthcare Providers:  SeriousBroker.it  This test is no t yet approved or cleared by the [place] FDA and  has been authorized for detection and/or diagnosis of SARS-CoV-2 by FDA under an Emergency Use Authorization (EUA). This EUA will remain  in effect (meaning this test can be used) for the duration of the COVID-19 declaration under Section 564(b)(1) of the Act, 21 U.S.C.section 360bbb-3(b)(1), unless the authorization is terminated  or revoked sooner.       Influenza A by PCR NEGATIVE NEGATIVE   Influenza B by PCR NEGATIVE NEGATIVE    Comment: (NOTE) The Xpert Xpress SARS-CoV-2/FLU/RSV plus assay is intended as an aid in the diagnosis of influenza from Nasopharyngeal swab specimens and should not be used as a sole basis for treatment. Nasal washings and aspirates are unacceptable for Xpert Xpress SARS-CoV-2/FLU/RSV testing.  Fact Sheet for Patients: BloggerCourse.com  Fact Sheet for Healthcare Providers: SeriousBroker.it  This test is not yet approved or cleared by the [place] FDA and has been authorized for detection and/or diagnosis of SARS-CoV-2 by FDA under an Emergency Use Authorization (EUA). This EUA will remain in effect (meaning this test can be used) for the duration of the COVID-19 declaration under Section 564(b)(1) of the Act, 21  U.S.C. section 360bbb-3(b)(1), unless the authorization is terminated or revoked.     Resp Syncytial Virus by PCR NEGATIVE NEGATIVE    Comment: (NOTE) Fact Sheet for Patients: BloggerCourse.com  Fact Sheet for Healthcare Providers: SeriousBroker.it  This test is not yet approved or cleared by the [place] FDA and has been authorized for detection and/or diagnosis of SARS-CoV-2 by FDA under an Emergency Use Authorization (EUA). This EUA will remain in effect (meaning this test can be used) for the duration of the COVID-19 declaration under Section 564(b)(1) of the Act, 21 U.S.C. section 360bbb-3(b)(1), unless the authorization is terminated or revoked.  Performed at [facility], [address], [place], [place] [address]   CBC with Differential/Platelet     Status: Abnormal   Collection Time: 04/20/23  7:55 AM  Result Value Ref Range   WBC 13.8 (H) 4.0 - 10.5 K/uL   RBC 4.36 4.22 - 5.81 MIL/uL   Hemoglobin 13.4 13.0 - 17.0 g/dL   HCT 86.5 78.4 - 69.6 %   MCV 91.7 80.0 - 100.0 fL   MCH 30.7 26.0 - 34.0 pg   MCHC 33.5 30.0 - 36.0 g/dL   RDW 29.5 28.4 - 13.2 %   Platelets 268 150 - 400 K/uL   nRBC 0.0 0.0 -  0.2 %   Neutrophils Relative % 43 %   Neutro Abs 6.1 1.7 - 7.7 K/uL   Band Neutrophils 1 %   Lymphocytes Relative 28 %   Lymphs Abs 3.9 0.7 - 4.0 K/uL   Monocytes Relative 4 %   Monocytes Absolute 0.6 0.1 - 1.0 K/uL   Eosinophils Relative 22 %   Eosinophils Absolute 3.0 (H) 0.0 - 0.5 K/uL   Basophils Relative 2 %   Basophils Absolute 0.3 (H) 0.0 - 0.1 K/uL   Smear Review MORPHOLOGY UNREMARKABLE    Abs Immature Granulocytes 0.00 0.00 - 0.07 K/uL   Reactive, Benign Lymphocytes PRESENT    Polychromasia PRESENT     Comment: Performed at [facility], [address] [place], [place] [address]  Basic metabolic panel     Status: Abnormal   Collection Time: 04/20/23  7:55 AM  Result Value Ref Range   Sodium 135 135 -  145 mmol/L   Potassium 3.5 3.5 - 5.1 mmol/L   Chloride 104 98 - 111 mmol/L   CO2 23 22 - 32 mmol/L   Glucose, Bld 96 70 - 99 mg/dL    Comment: Glucose reference range applies only to samples taken after fasting for at least 8 hours.   BUN 15 6 - 20 mg/dL   Creatinine, Ser 2.95 0.61 - 1.24 mg/dL   Calcium 8.8 (L) 8.9 - 10.3 mg/dL   GFR, Estimated >28 >41 mL/min    Comment: (NOTE) Calculated using the CKD-EPI Creatinine Equation (2021)    Anion gap 8 5 - 15    Comment: Performed at [facility], [address], [place], [place] [address]   *Note: Due to a large number of results and/or encounters for the requested time period, some results have not been displayed. A complete set of results can be found in Results Review.   CT Chest W Contrast  Result Date: 04/20/2023 CLINICAL DATA:  Pneumonia with complication suspected. EXAM: CT CHEST WITH CONTRAST TECHNIQUE: Multidetector CT imaging of the chest was performed during intravenous contrast administration. RADIATION DOSE REDUCTION: This exam was performed according to the departmental dose-optimization program which includes automated exposure control, adjustment of the mA and/or kV according to patient size and/or use of iterative reconstruction technique. CONTRAST:  75mL OMNIPAQUE IOHEXOL 300 MG/ML  SOLN COMPARISON:  11/12/2020 CT of the neck. 11/25/2020 thyroid ultrasound FINDINGS: Cardiovascular: Normal heart size. No pericardial effusion. No acute vascular finding. Mediastinum/Nodes: Mild prominence of hilar lymphatic tissue considered reactive. No worrisome mediastinal lymph node. There is a left lobe thyroid nodule measuring 3.8 cm with growth and further mass effect on the trachea when compared to 2022. Thyroid biopsy was recommended in 2022. Lungs/Pleura: Generalized airway thickening with airspace opacity at the lung bases, middle lobe, and lingula. Incomplete fissure dividing the left upper lobe at the level of the lingula. There is  diffuse interlobular septal thickening and generalized patchy ground-glass density which may relate to the patient's asthma history. No convincing pulmonary edema. No pleural fluid or air leak. No cavitation. Upper Abdomen: Bilateral renal cystic densities which are non worrisome. No acute finding. Musculoskeletal: No acute finding IMPRESSION: Generalized bronchitis with multifocal pneumonia especially affecting the lung bases. 3.8 cm left thyroid nodule with mild growth and tracheal mass effect since 2022. Biopsy was recommended by ultrasound in 2022, but not visibly completed in the patient's imaging chart. Recommend completed follow-up if not previously performed. Electronically Signed   By: Tiburcio Pea M.D.   On: 04/20/2023 10:36  DG Chest Port 1 View  Result Date: 04/20/2023 CLINICAL DATA:  47 year old male with possible pneumonia. EXAM: PORTABLE CHEST 1 VIEW COMPARISON:  Chest x-ray 02/04/2023. FINDINGS: Diffuse interstitial prominence and widespread peribronchial cuffing with patchy ill-defined opacities throughout the lung bases bilaterally concerning for probable bronchitis and developing bronchopneumonia. Small left pleural effusion. No definite right pleural effusion. No pneumothorax. No evidence of pulmonary edema. Heart size is normal. Upper mediastinal contours are within normal limits. IMPRESSION: 1. Findings are concerning for probable bronchitis and developing bronchopneumonia, most severe in the lung bases. 2. Small left pleural effusion. Electronically Signed   By: Trudie Reed M.D.   On: 04/20/2023 07:49    Pending Labs Unresulted Labs (From admission, onward)    None       Vitals/Pain Today's Vitals   04/20/23 1115 04/20/23 1130 04/20/23 1137 04/20/23 1145  BP: 108/67 131/85  127/74  Pulse: 76 78  85  Resp:      Temp:   98.5 F (36.9 C)   TempSrc:   Oral   SpO2: 93% (!) 89%  91%  Weight:      Height:      PainSc:        Isolation Precautions Airborne and  Contact precautions  Medications Medications  levofloxacin (LEVAQUIN) IVPB 750 mg (750 mg Intravenous New Bag/Given 04/20/23 1137)  iohexol (OMNIPAQUE) 300 MG/ML solution 75 mL (75 mLs Intravenous Contrast Given 04/20/23 0941)    Mobility walks     Focused Assessments Pulmonary Assessment Handoff:  Lung sounds: Bilateral Breath Sounds: Clear, Diminished L Breath Sounds: Clear, Diminished (Lower is diminished) R Breath Sounds: Clear, Diminished (Lower lobes diminished) O2 Device: Room Air      R Recommendations: See Admitting Provider Note  Report given to:   Additional Notes: Patient placed on 1L nasal cannula to help keep Oxygen sats above 90%. Patient NOT on oxygen baseline.

## 2023-04-20 NOTE — ED Notes (Signed)
Patient ambulated to restroom.

## 2023-05-09 ENCOUNTER — Emergency Department (HOSPITAL_COMMUNITY)
Admission: EM | Admit: 2023-05-09 | Discharge: 2023-05-09 | Disposition: A | Discharge status: Home / Self Care | Payer: BC Managed Care – PPO | Attending: Emergency Medicine | Admitting: Emergency Medicine

## 2023-05-09 ENCOUNTER — Encounter (HOSPITAL_COMMUNITY): Payer: Self-pay

## 2023-05-09 ENCOUNTER — Emergency Department (HOSPITAL_COMMUNITY): Payer: BC Managed Care – PPO

## 2023-05-09 ENCOUNTER — Other Ambulatory Visit: Payer: Self-pay

## 2023-05-09 ENCOUNTER — Ambulatory Visit: Payer: Self-pay

## 2023-05-09 DIAGNOSIS — R0602 Shortness of breath: Secondary | ICD-10-CM | POA: Insufficient documentation

## 2023-05-09 DIAGNOSIS — J45909 Unspecified asthma, uncomplicated: Secondary | ICD-10-CM | POA: Insufficient documentation

## 2023-05-09 LAB — BASIC METABOLIC PANEL
Anion gap: 6 (ref 5–15)
BUN: 13 mg/dL (ref 6–20)
CO2: 23 mmol/L (ref 22–32)
Calcium: 8.8 mg/dL — ABNORMAL LOW (ref 8.9–10.3)
Chloride: 108 mmol/L (ref 98–111)
Creatinine, Ser: 0.69 mg/dL (ref 0.61–1.24)
GFR, Estimated: >60 mL/min (ref >60)
Glucose, Bld: 104 mg/dL — ABNORMAL HIGH (ref 70–99)
Potassium: 3.2 mmol/L — ABNORMAL LOW (ref 3.5–5.1)
Sodium: 137 mmol/L (ref 135–145)

## 2023-05-09 LAB — CBC
HCT: 41.6 % (ref 39.0–52.0)
Hemoglobin: 14 g/dL (ref 13.0–17.0)
MCH: 30.8 pg (ref 26.0–34.0)
MCHC: 33.7 g/dL (ref 30.0–36.0)
MCV: 91.6 fL (ref 80.0–100.0)
Platelets: 279 10*3/uL (ref 150–400)
RBC: 4.54 MIL/uL (ref 4.22–5.81)
RDW: 14.9 % (ref 11.5–15.5)
WBC: 11.4 10*3/uL — ABNORMAL HIGH (ref 4.0–10.5)
nRBC: 0 % (ref 0.0–0.2)

## 2023-05-09 MED ORDER — DOXYCYCLINE HYCLATE 100 MG PO CAPS
100.0000 mg | ORAL_CAPSULE | Freq: Two times a day (BID) | ORAL | 0 refills | Status: DC
Start: 2023-05-09 — End: 2023-06-04

## 2023-05-09 MED ORDER — ALBUTEROL SULFATE (2.5 MG/3ML) 0.083% IN NEBU
10.0000 mg | INHALATION_SOLUTION | RESPIRATORY_TRACT | Status: AC
  Administered 2023-05-09: 10 mg via RESPIRATORY_TRACT
  Filled 2023-05-09: qty 12

## 2023-05-09 MED ORDER — DEXAMETHASONE SODIUM PHOSPHATE 10 MG/ML IJ SOLN
10.0000 mg | Freq: Once | INTRAMUSCULAR | Status: AC
  Administered 2023-05-09: 10 mg via INTRAVENOUS
  Filled 2023-05-09: qty 1

## 2023-05-09 MED ORDER — BENZONATATE 100 MG PO CAPS
100.0000 mg | ORAL_CAPSULE | Freq: Three times a day (TID) | ORAL | 0 refills | Status: DC
Start: 2023-05-09 — End: 2023-06-04

## 2023-05-09 MED ORDER — PREDNISONE 20 MG PO TABS: 40.0000 mg | ORAL_TABLET | Freq: Every day | ORAL | 0 refills | Status: DC

## 2023-05-09 NOTE — ED Provider Notes (Signed)
[place] EMERGENCY DEPARTMENT AT [facility] Provider Note   CSN: [id number] Arrival date & time: 05/09/23  7564     History  Chief Complaint  Patient presents with   Shortness of Breath    Drew Sandoval is a 47 y.o. male.   Shortness of Breath  This patient is a 47 year old male, he has a history of severe asthma that he did not develop until he was in his 33s, he states that he has had shortness of breath going on for over a month, he reports that he was actually seen earlier in December which I have verified, he left AGAINST MEDICAL ADVICE when he was supposed to be admitted for hypoxic respiratory failure and multifocal pneumonia.  He reports that he had to go help his wife who was having a heart attack.  The patient reports he finished the Levaquin, he finished the prednisone but the shortness of breath worsened over the last several days despite using albuterol treatments at home.  He has not had any more steroids, he denies fevers, he is producing clear phlegm.  He denies swelling of his legs.    Home Medications Prior to Admission medications   Medication Sig Start Date End Date Taking? Authorizing Provider  benzonatate (TESSALON) 100 MG capsule Take 1 capsule (100 mg total) by mouth every 8 (eight) hours. 05/09/23  Yes Eber Hong, MD  doxycycline (VIBRAMYCIN) 100 MG capsule Take 1 capsule (100 mg total) by mouth 2 (two) times daily. 05/09/23  Yes Eber Hong, MD  predniSONE (DELTASONE) 20 MG tablet Take 2 tablets (40 mg total) by mouth daily. 05/09/23  Yes Eber Hong, MD  acetaminophen (TYLENOL) 325 MG tablet Take 2 tablets (650 mg total) by mouth every 6 (six) hours as needed for mild pain, headache or fever (or Fever >/= 101). 02/05/23   Almon Hercules, MD  acetaminophen (TYLENOL) 500 MG tablet Take 1,000 mg by mouth every 6 (six) hours as needed for moderate pain.    [provider]  albuterol (VENTOLIN HFA) 108 (90 Base) MCG/ACT inhaler Inhale  2 puffs into the lungs every 6 (six) hours as needed for wheezing or shortness of breath. 11/22/22   Leath-Warren, Sadie Haber, NP  amLODipine (NORVASC) 5 MG tablet TAKE 1 TABLET (5 MG TOTAL) BY MOUTH DAILY. Patient taking differently: Take 5 mg by mouth as needed (high blood pressure). 02/12/22   Marinus Maw, MD  Budeson-Glycopyrrol-Formoterol (BREZTRI AEROSPHERE) 160-9-4.8 MCG/ACT AERO Inhale 2 puffs into the lungs 2 (two) times daily. Rinse mouth 12/24/22   Glenford Bayley, NP  Ensifentrine [facility]) 3 MG/2.5ML SUSP Inhale 2.5 mLs into the lungs 2 (two) times daily. 03/28/23   Jetty Duhamel D, MD  EPINEPHrine 0.3 mg/0.3 mL IJ SOAJ injection Inject into thigh for severe allergic reaction 09/28/22   Jetty Duhamel D, MD  levalbuterol (XOPENEX) 0.31 MG/3ML nebulizer solution Take 3 mLs (0.31 mg total) by nebulization every 4 (four) hours as needed for wheezing. 09/24/22   Kommor, Madison, MD  levofloxacin (LEVAQUIN) 750 MG tablet Take 1 tablet (750 mg total) by mouth daily. 04/21/23   Catarina Hartshorn, MD  montelukast (SINGULAIR) 10 MG tablet Take 1 tablet (10 mg total) by mouth at bedtime. Patient taking differently: Take 10 mg by mouth daily. 12/24/22   Glenford Bayley, NP  primidone (MYSOLINE) 50 MG tablet TAKE 4 TABLETS TWICE A DAY 03/07/23   Tat, Octaviano Batty, DO      Allergies    Azithromycin, Banana,  Alvesco [ciclesonide], Anoro ellipta [umeclidinium-vilanterol], Methylprednisolone, and Penicillins    Review of Systems   Review of Systems  Respiratory:  Positive for shortness of breath.   All other systems reviewed and are negative.   Physical Exam Updated Vital Signs BP (!) 138/93   Pulse 72   Temp 99.4 F (37.4 C) (Oral)   Resp (!) 24   Ht 1.905 m (6\' 3" )   Wt 84.8 kg   SpO2 92%   BMI 23.37 kg/m  Physical Exam Vitals and nursing note reviewed.  Constitutional:      General: He is not in acute distress.    Appearance: He is well-developed.  HENT:     Head: Normocephalic  and atraumatic.     Mouth/Throat:     Pharynx: No oropharyngeal exudate.  Eyes:     General: No scleral icterus.       Right eye: No discharge.        Left eye: No discharge.     Conjunctiva/sclera: Conjunctivae normal.     Pupils: Pupils are equal, round, and reactive to light.  Neck:     Thyroid: No thyromegaly.     Vascular: No JVD.  Cardiovascular:     Rate and Rhythm: Normal rate and regular rhythm.     Heart sounds: Normal heart sounds. No murmur heard.    No friction rub. No gallop.  Pulmonary:     Effort: Pulmonary effort is normal. No respiratory distress.     Breath sounds: Wheezing present. No rales.     Comments: There is no increased work of breathing, he speaks in full sentences, he does have diffuse expiratory wheezing but is moving reasonable air, there is no rales or rhonchi, no coughing throughout the exam Abdominal:     General: Bowel sounds are normal. There is no distension.     Palpations: Abdomen is soft. There is no mass.     Tenderness: There is no abdominal tenderness.  Musculoskeletal:        General: No tenderness. Normal range of motion.     Cervical back: Normal range of motion and neck supple.     Right lower leg: No edema.     Left lower leg: No edema.  Lymphadenopathy:     Cervical: No cervical adenopathy.  Skin:    General: Skin is warm and dry.     Findings: No erythema or rash.  Neurological:     Mental Status: He is alert.     Coordination: Coordination normal.  Psychiatric:        Behavior: Behavior normal.     ED Results / Procedures / Treatments   Labs (all labs ordered are listed, but only abnormal results are displayed) Labs Reviewed  CBC - Abnormal; Notable for the following components:      Result Value   WBC 11.4 (*)    All other components within normal limits  BASIC METABOLIC PANEL - Abnormal; Notable for the following components:   Potassium 3.2 (*)    Glucose, Bld 104 (*)    Calcium 8.8 (*)    All other components  within normal limits    EKG None  Radiology DG Chest 2 View Result Date: 05/09/2023 CLINICAL DATA:  47 year old male with cough, shortness of breath, no improvement with inhalers. EXAM: CHEST - 2 VIEW COMPARISON:  Chest CT 04/20/2023 and earlier. FINDINGS: Coarse bilateral pulmonary interstitial opacity which is only mildly more confluent in the lung bases does not appear significantly changed  from the chest CT earlier this month, and only some of the opacity is new compared to September radiographs. Underlying large lung volumes, hyperinflation suspected. No pneumothorax, pleural effusion. No definite consolidation at this time. Mediastinal contours remain normal. Visualized tracheal air column is within normal limits. No acute osseous abnormality identified. Negative visible bowel gas. IMPRESSION: Widespread coarse bilateral pulmonary interstitial opacity, unchanged 2 degrees since September this year, stable to mildly progressed from CT earlier this month. Favor unresolved bilateral pneumonia superimposed on chronic lung disease. However, a noninfectious pulmonary inflammation (such as vasculitis, hypersensitivity) might appear similar. No consolidation or pleural effusion. Electronically Signed   By: Odessa Fleming M.D.   On: 05/09/2023 11:08    Procedures Procedures    Medications Ordered in ED Medications  albuterol (PROVENTIL) (2.5 MG/3ML) 0.083% nebulizer solution 10 mg (0 mg Nebulization Stopped 05/09/23 1250)  dexamethasone (DECADRON) injection 10 mg (10 mg Intravenous Given 05/09/23 1025)    ED Course/ Medical Decision Making/ A&P                                 Medical Decision Making Amount and/or Complexity of Data Reviewed Labs: ordered. Radiology: ordered.  Risk Prescription drug management.    This patient presents to the ED for concern of shortness of breath, this involves an extensive number of treatment options, and is a complaint that carries with it a high risk of  complications and morbidity.  The differential diagnosis includes asthma saturation, worsening pneumonia, pneumothorax   Co morbidities that complicate the patient evaluation  Chronic severe asthma   Additional history obtained:  Additional history obtained from medical record External records from outside source obtained and reviewed including recent ideation and hospitalist notes, outpatient pulmonary notes, he sees Dr. Maple Hudson   Lab Tests:  I Ordered, and personally interpreted labs.  The pertinent results include: Mild leukocytosis, no significant renal dysfunction, electrolytes basically normal   Imaging Studies ordered:  I ordered imaging studies including chest x-ray I independently visualized and interpreted imaging which showed widespread pulmonary interstitial opacities essentially unchanged I agree with the radiologist interpretation   Cardiac Monitoring: / EKG:  The patient was maintained on a cardiac monitor.  I personally viewed and interpreted the cardiac monitored which showed an underlying rhythm of: Normal sinus rhythm    Problem List / ED Course / Critical interventions / Medication management  Unsure about the exact cause of the patient's shortness of breath but he does appear to have a progressive pulmonary condition.  I sent messaging to pulmonology for follow-up, the patient was given stabilizing care with steroids and albuterol and feels better, medications will be sent for home, he is agreeable to the plan  Social Determinants of Health:  Asthma   Test / Admission - Considered:  Considered admission but the patient has chronic condition, that he is not requiring extra oxygen, stable for discharge repeat exam with clear lungs, sats are over 95% on room air         Final Clinical Impression(s) / ED Diagnoses Final diagnoses:  Shortness of breath    Rx / DC Orders ED Discharge Orders          Ordered    doxycycline (VIBRAMYCIN) 100 MG  capsule  2 times daily        05/09/23 1313    predniSONE (DELTASONE) 20 MG tablet  Daily        05/09/23 1313  benzonatate (TESSALON) 100 MG capsule  Every 8 hours        05/09/23 1313              Eber Hong, MD 05/09/23 1316

## 2023-05-09 NOTE — ED Notes (Signed)
Discussed respiratory status with RT.

## 2023-05-09 NOTE — Discharge Instructions (Signed)
You will need further testing by a pulmonary specialist See phone number for Dr. Sherene Sires, call today for appointment  Doxycycline is an antibiotic which is taken twice a day, this treats bacterial infections that can cause staph infections, it treats sinus infections and some pneumonia. It also treats tick infections like Lyme's disease.  In this case I would like for you to take the antibiotic exactly as prescribed until it is completed.  Please be aware that occasionally people will get a rash if they are in the sunlight for extended periods of time while taking this medicine.   Prednisone is a steroid that helps to reduce certain types of inflammation and may be used for allergic reactions, some rashes such as poison ivy or dermatitis, for asthma attacks or bronchitis and for certain types of pain.  Please take this medicine exactly as prescribed - 40mg  by mouth daily for 5 days.  This can have certain side effects with some people including feeling like you can't sleep, feeling anxious or feeling like you are on a "high".  It should not cause weight gain if only taken for a short time.  Please be aware that this medication may also cause an elevation in your blood sugar if you are a diabetic so if you are a diabetic you will need to keep a very close eye on your blood sugar, make sure that you are eating an extremely low level of carbohydrates and taking your medications exactly as prescribed.  If you should develop severe high blood sugar or start to feel poorly return to the emergency department immediately   Thank you for allowing [place] to treat you in the emergency department today.  After reviewing your examination and potential testing that was done it appears that you are safe to go home.  I would like for you to follow-up with your doctor within the next several days, have them obtain your records and follow-up with them to review all potential tests and results from your visit.  If you should develop  severe or worsening symptoms return to the emergency department immediately

## 2023-05-09 NOTE — ED Triage Notes (Signed)
Pt presents to ED, states started having asthma flare up Saturday night with shortness of breath. Pt states has been using his inhalers but not getting much better.

## 2023-05-12 ENCOUNTER — Other Ambulatory Visit: Payer: Self-pay

## 2023-05-16 ENCOUNTER — Other Ambulatory Visit: Payer: Self-pay

## 2023-05-16 ENCOUNTER — Other Ambulatory Visit (HOSPITAL_COMMUNITY): Payer: Self-pay

## 2023-05-16 ENCOUNTER — Other Ambulatory Visit (HOSPITAL_COMMUNITY): Payer: Self-pay | Admitting: Pharmacy Technician

## 2023-05-16 ENCOUNTER — Other Ambulatory Visit: Payer: Self-pay | Admitting: Internal Medicine

## 2023-05-16 DIAGNOSIS — J4550 Severe persistent asthma, uncomplicated: Secondary | ICD-10-CM

## 2023-05-16 DIAGNOSIS — J4551 Severe persistent asthma with (acute) exacerbation: Secondary | ICD-10-CM

## 2023-05-16 MED ORDER — DUPIXENT 300 MG/2ML ~~LOC~~ SOAJ
300.0000 mg | SUBCUTANEOUS | 1 refills | Status: DC
  Filled 2023-05-16: qty 4, 28d supply, fill #0

## 2023-05-16 NOTE — Addendum Note (Signed)
 Addended by: Murrell Redden on: 05/16/2023 04:01 PM   Modules accepted: Orders

## 2023-05-16 NOTE — Progress Notes (Signed)
 Specialty Pharmacy Refill Coordination Note  Drew Sandoval is a 48 y.o. male contacted today regarding refills of specialty medication(s) Dupilumab  (Dupixent )   Patient requested Delivery   Delivery date: 05/24/23   Verified address: [address] [place]   Medication will be filled on 05/22/22.  Refill Request sent to MD; Call if any delays

## 2023-05-16 NOTE — Progress Notes (Signed)
 Specialty Pharmacy Ongoing Clinical Assessment Note  Drew Sandoval is a 48 y.o. male who is being followed by the specialty pharmacy service for RxSp Asthma/COPD   Patient's specialty medication(s) reviewed today: Dupilumab  (Dupixent )   Missed doses in the last 4 weeks: 0   Patient/Caregiver did not have any additional questions or concerns.   Therapeutic benefit summary: Patient is achieving benefit   Adverse events/side effects summary: No adverse events/side effects   Patient's therapy is appropriate to: Continue    Goals Addressed             This Visit's Progress    Reduce disease symptoms including coughing and shortness of breath       Patient is on track. Patient will maintain adherence         Follow up:  6 months  [name] Specialty Pharmacist

## 2023-05-18 ENCOUNTER — Emergency Department (HOSPITAL_COMMUNITY)
Admission: EM | Admit: 2023-05-18 | Discharge: 2023-05-18 | Disposition: A | Discharge status: Home / Self Care | Payer: BC Managed Care – PPO | Attending: Emergency Medicine | Admitting: Emergency Medicine

## 2023-05-18 ENCOUNTER — Other Ambulatory Visit: Payer: Self-pay

## 2023-05-18 ENCOUNTER — Emergency Department (HOSPITAL_COMMUNITY): Payer: BC Managed Care – PPO

## 2023-05-18 DIAGNOSIS — Z7951 Long term (current) use of inhaled steroids: Secondary | ICD-10-CM | POA: Insufficient documentation

## 2023-05-18 DIAGNOSIS — D721 Eosinophilia, unspecified: Secondary | ICD-10-CM | POA: Diagnosis not present

## 2023-05-18 DIAGNOSIS — J9601 Acute respiratory failure with hypoxia: Secondary | ICD-10-CM | POA: Diagnosis not present

## 2023-05-18 DIAGNOSIS — D649 Anemia, unspecified: Secondary | ICD-10-CM | POA: Diagnosis not present

## 2023-05-18 DIAGNOSIS — Z79899 Other long term (current) drug therapy: Secondary | ICD-10-CM | POA: Diagnosis not present

## 2023-05-18 DIAGNOSIS — I10 Essential (primary) hypertension: Secondary | ICD-10-CM | POA: Insufficient documentation

## 2023-05-18 DIAGNOSIS — R918 Other nonspecific abnormal finding of lung field: Secondary | ICD-10-CM | POA: Diagnosis not present

## 2023-05-18 DIAGNOSIS — J189 Pneumonia, unspecified organism: Secondary | ICD-10-CM | POA: Diagnosis not present

## 2023-05-18 DIAGNOSIS — J4541 Moderate persistent asthma with (acute) exacerbation: Secondary | ICD-10-CM | POA: Diagnosis not present

## 2023-05-18 DIAGNOSIS — R0602 Shortness of breath: Secondary | ICD-10-CM | POA: Diagnosis not present

## 2023-05-18 DIAGNOSIS — J4551 Severe persistent asthma with (acute) exacerbation: Secondary | ICD-10-CM | POA: Diagnosis not present

## 2023-05-18 LAB — CBC WITH DIFFERENTIAL/PLATELET
Abs Immature Granulocytes: 0 10*3/uL (ref 0.00–0.07)
Basophils Absolute: 0.5 10*3/uL — ABNORMAL HIGH (ref 0.0–0.1)
Basophils Relative: 3 %
Eosinophils Absolute: 7.2 10*3/uL — ABNORMAL HIGH (ref 0.0–0.5)
Eosinophils Relative: 44 %
HCT: 42.3 % (ref 39.0–52.0)
Hemoglobin: 13.8 g/dL (ref 13.0–17.0)
Lymphocytes Relative: 17 %
Lymphs Abs: 2.8 10*3/uL (ref 0.7–4.0)
MCH: 30.3 pg (ref 26.0–34.0)
MCHC: 32.6 g/dL (ref 30.0–36.0)
MCV: 92.8 fL (ref 80.0–100.0)
Monocytes Absolute: 1 10*3/uL (ref 0.1–1.0)
Monocytes Relative: 6 %
Neutro Abs: 4.9 10*3/uL (ref 1.7–7.7)
Neutrophils Relative %: 30 %
Platelets: 224 10*3/uL (ref 150–400)
RBC: 4.56 MIL/uL (ref 4.22–5.81)
RDW: 15.1 % (ref 11.5–15.5)
WBC: 16.4 10*3/uL — ABNORMAL HIGH (ref 4.0–10.5)
nRBC: 0 % (ref 0.0–0.2)

## 2023-05-18 LAB — COMPREHENSIVE METABOLIC PANEL
ALT: 16 U/L (ref 0–44)
AST: 18 U/L (ref 15–41)
Albumin: 3.3 g/dL — ABNORMAL LOW (ref 3.5–5.0)
Alkaline Phosphatase: 69 U/L (ref 38–126)
Anion gap: 6 (ref 5–15)
BUN: 18 mg/dL (ref 6–20)
CO2: 22 mmol/L (ref 22–32)
Calcium: 8.4 mg/dL — ABNORMAL LOW (ref 8.9–10.3)
Chloride: 108 mmol/L (ref 98–111)
Creatinine, Ser: 0.76 mg/dL (ref 0.61–1.24)
GFR, Estimated: >60 mL/min (ref >60)
Glucose, Bld: 78 mg/dL (ref 70–99)
Potassium: 3.6 mmol/L (ref 3.5–5.1)
Sodium: 136 mmol/L (ref 135–145)
Total Bilirubin: <0.2 mg/dL (ref 0.0–1.2)
Total Protein: 7.8 g/dL (ref 6.5–8.1)

## 2023-05-18 LAB — MAGNESIUM: Magnesium: 1.7 mg/dL (ref 1.7–2.4)

## 2023-05-18 MED ORDER — ALBUTEROL SULFATE (2.5 MG/3ML) 0.083% IN NEBU
2.5000 mg | INHALATION_SOLUTION | Freq: Once | RESPIRATORY_TRACT | Status: AC
  Administered 2023-05-18: 2.5 mg via RESPIRATORY_TRACT
  Filled 2023-05-18: qty 3

## 2023-05-18 MED ORDER — IPRATROPIUM-ALBUTEROL 0.5-2.5 (3) MG/3ML IN SOLN
3.0000 mL | Freq: Once | RESPIRATORY_TRACT | Status: AC
  Administered 2023-05-18: 3 mL via RESPIRATORY_TRACT
  Filled 2023-05-18: qty 3

## 2023-05-18 MED ORDER — PREDNISONE 10 MG PO TABS: 40.0000 mg | ORAL_TABLET | Freq: Every day | ORAL | 0 refills | Status: AC

## 2023-05-18 MED ORDER — ALBUTEROL SULFATE HFA 108 (90 BASE) MCG/ACT IN AERS
1.0000 | INHALATION_SPRAY | Freq: Once | RESPIRATORY_TRACT | Status: AC
  Administered 2023-05-18: 1 via RESPIRATORY_TRACT
  Filled 2023-05-18: qty 6.7

## 2023-05-18 MED ORDER — MAGNESIUM SULFATE 2 GM/50ML IV SOLN
2.0000 g | Freq: Once | INTRAVENOUS | Status: AC
  Administered 2023-05-18: 2 g via INTRAVENOUS
  Filled 2023-05-18: qty 50

## 2023-05-18 MED ORDER — IPRATROPIUM BROMIDE 0.02 % IN SOLN
0.5000 mg | Freq: Once | RESPIRATORY_TRACT | Status: AC
  Administered 2023-05-18: 0.5 mg via RESPIRATORY_TRACT
  Filled 2023-05-18: qty 2.5

## 2023-05-18 MED ORDER — METHYLPREDNISOLONE SODIUM SUCC 125 MG IJ SOLR
125.0000 mg | Freq: Once | INTRAMUSCULAR | Status: AC
  Administered 2023-05-18: 125 mg via INTRAVENOUS
  Filled 2023-05-18: qty 2

## 2023-05-18 NOTE — ED Triage Notes (Signed)
 Pt c/o SOB x1 month. Reports being dx with pneumonia in December and feels he has not gotten better. Has been using albuterol inhaler with no relief.  Hx of asthma

## 2023-05-18 NOTE — ED Provider Notes (Signed)
  EMERGENCY DEPARTMENT AT [facility] Provider Note   CSN: [id number] Arrival date & time: 05/18/23  0535     History  Chief Complaint  Patient presents with   Shortness of Breath    Drew Sandoval is a 48 y.o. male.  The history is provided by the patient.  Shortness of Breath He has history of hypertension, asthma, but hyper IgE syndrome and comes in with difficulty breathing tonight.  He states he actually has been having problems for the last 5 weeks, but it got much worse tonight.  He has been using his albuterol  inhaler without relief.  There has been a cough which is mainly nonproductive.  He denies fever or chills.  He denies arthralgias or myalgias.  He is a non-smoker.   Home Medications Prior to Admission medications   Medication Sig Start Date End Date Taking? Authorizing Provider  acetaminophen  (TYLENOL ) 325 MG tablet Take 2 tablets (650 mg total) by mouth every 6 (six) hours as needed for mild pain, headache or fever (or Fever >/= 101). 02/05/23   Gonfa, Taye T, MD  acetaminophen  (TYLENOL ) 500 MG tablet Take 1,000 mg by mouth every 6 (six) hours as needed for moderate pain.    [provider]  albuterol  (VENTOLIN  HFA) 108 (90 Base) MCG/ACT inhaler Inhale 2 puffs into the lungs every 6 (six) hours as needed for wheezing or shortness of breath. 11/22/22   Leath-Warren, Etta PARAS, NP  amLODipine  (NORVASC ) 5 MG tablet TAKE 1 TABLET (5 MG TOTAL) BY MOUTH DAILY. Patient taking differently: Take 5 mg by mouth as needed (high blood pressure). 02/12/22   Waddell Danelle ORN, MD  benzonatate  (TESSALON ) 100 MG capsule Take 1 capsule (100 mg total) by mouth every 8 (eight) hours. 05/09/23   Cleotilde Rogue, MD  Budeson-Glycopyrrol-Formoterol  (BREZTRI  AEROSPHERE) 160-9-4.8 MCG/ACT AERO Inhale 2 puffs into the lungs 2 (two) times daily. Rinse mouth 12/24/22   Hope Almarie ORN, NP  doxycycline  (VIBRAMYCIN ) 100 MG capsule Take 1 capsule (100 mg total) by mouth 2  (two) times daily. 05/09/23   Cleotilde Rogue, MD  Dupilumab  (DUPIXENT ) 300 MG/2ML SOAJ Inject 300 mg into the skin every 14 (fourteen) days. 05/16/23   Neysa Reggy BIRCH, MD  Ensifentrine  (OHTUVAYRE ) 3 MG/2.5ML SUSP Inhale 2.5 mLs into the lungs 2 (two) times daily. 03/28/23   Neysa Reggy D, MD  EPINEPHrine  0.3 mg/0.3 mL IJ SOAJ injection Inject into thigh for severe allergic reaction 09/28/22   Neysa Reggy D, MD  levalbuterol  (XOPENEX ) 0.31 MG/3ML nebulizer solution Take 3 mLs (0.31 mg total) by nebulization every 4 (four) hours as needed for wheezing. 09/24/22   Kommor, Madison, MD  levofloxacin  (LEVAQUIN ) 750 MG tablet Take 1 tablet (750 mg total) by mouth daily. 04/21/23   Evonnie Lenis, MD  montelukast  (SINGULAIR ) 10 MG tablet Take 1 tablet (10 mg total) by mouth at bedtime. Patient taking differently: Take 10 mg by mouth daily. 12/24/22   Hope Almarie ORN, NP  predniSONE  (DELTASONE ) 20 MG tablet Take 2 tablets (40 mg total) by mouth daily. 05/09/23   Cleotilde Rogue, MD  primidone  (MYSOLINE ) 50 MG tablet TAKE 4 TABLETS TWICE A DAY 03/07/23   Tat, Asberry RAMAN, DO      Allergies    Azithromycin , Banana, Alvesco  [ciclesonide ], Anoro ellipta  [umeclidinium-vilanterol], Methylprednisolone , and Penicillins    Review of Systems   Review of Systems  Respiratory:  Positive for shortness of breath.   All other systems reviewed and are negative.   Physical  Exam Updated Vital Signs BP (!) 141/107   Pulse 82   Temp 98 F (36.7 C)   Resp 18   SpO2 93%  Physical Exam Vitals and nursing note reviewed.   48 year old male, in mild respiratory distress.  He is using accessory muscles of respiration, but is able to speak in complete sentences without stopping for breath.  Vital signs are significant for elevated blood pressure. Oxygen  saturation is 93%, which is normal. Head is normocephalic and atraumatic. PERRLA, EOMI. Oropharynx is clear. Neck is nontender and supple without adenopathy. Lungs have  diminished airflow with diffuse inspiratory and expiratory wheezes.  There are no rales or rhonchi. Chest is nontender. Heart has regular rate and rhythm without murmur. Abdomen is soft, flat, nontender. Extremities have no cyanosis or edema, full range of motion is present. Skin is warm and dry without rash. Neurologic: Mental status is normal, moves all extremities equally.  ED Results / Procedures / Treatments   Labs (all labs ordered are listed, but only abnormal results are displayed) Labs Reviewed  COMPREHENSIVE METABOLIC PANEL - Abnormal; Notable for the following components:      Result Value   Calcium 8.4 (*)    Albumin 3.3 (*)    All other components within normal limits  CBC WITH DIFFERENTIAL/PLATELET - Abnormal; Notable for the following components:   WBC 16.4 (*)    Eosinophils Absolute 7.2 (*)    Basophils Absolute 0.5 (*)    All other components within normal limits  MAGNESIUM   PATHOLOGIST SMEAR REVIEW    EKG EKG Interpretation Date/Time:  Wednesday May 18 2023 05:45:54 EST Ventricular Rate:  84 PR Interval:  168 QRS Duration:  83 QT Interval:  381 QTC Calculation: 451 R Axis:   82  Text Interpretation: Sinus rhythm Biatrial enlargement Probable left ventricular hypertrophy ST elev, probable normal early repol pattern When compared with ECG of 02/04/2023, No significant change was found Confirmed by Raford Lenis ([id number]) on 05/18/2023 5:55:28 AM  Radiology No results found.  Procedures Procedures  Cardiac monitor shows normal sinus rhythm, per my interpretation.  Medications Ordered in ED Medications  ipratropium-albuterol  (DUONEB) 0.5-2.5 (3) MG/3ML nebulizer solution 3 mL (has no administration in time range)  methylPREDNISolone  sodium succinate (SOLU-MEDROL ) 125 mg/2 mL injection 125 mg (has no administration in time range)  magnesium  sulfate IVPB 2 g 50 mL (has no administration in time range)    ED Course/ Medical Decision Making/ A&P                                  Medical Decision Making Amount and/or Complexity of Data Reviewed Labs: ordered. Radiology: ordered.  Risk Prescription drug management.   Asthma exacerbation.  Rule out underlying pneumonia.  I have ordered nebulizer treatment with albuterol  and ipratropium, intravenous magnesium , and intravenous methylprednisolone .  I noted recorded allergy  to methylprednisolone  and recorded on 04/17/2019 but I also note that he had been given methylprednisolone  as recently as 09/23/2022 without any obvious reaction.  I note multiple hospitalizations for asthma, most recently on 02/04/2023.  Following nebulizer treatment, there was modest improvement.  He is still using accessory muscles of respiration.  There is improved airflow with diminished wheezing.  I have ordered a continuous nebulizer treatment.  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  Chest x-ray appears to show persistent bibasilar infiltrates, radiologist interpretation pending.  I have signed the case out to Dr. Albertina to reassess respiratory status following continuous nebulizer treatment.  CRITICAL CARE Performed by: Alm Lias Total critical care time: 50 minutes Critical care time was exclusive of separately billable procedures and treating other patients. Critical care was necessary to treat or prevent imminent or life-threatening deterioration. Critical care was time spent personally by me on the following activities: development of treatment plan with patient and/or surrogate as well as nursing, discussions with consultants, evaluation of patient's response to treatment, examination of patient, obtaining history from patient or surrogate, ordering and performing treatments and interventions, ordering and review of laboratory  studies, ordering and review of radiographic studies, pulse oximetry and re-evaluation of patient's condition.  Final Clinical Impression(s) / ED Diagnoses Final diagnoses:  Severe persistent asthma with exacerbation  Eosinophilia, unspecified type    Rx / DC Orders ED Discharge Orders     None         Lias Alm, MD 05/18/23 [phone]

## 2023-05-18 NOTE — ED Provider Notes (Signed)
  Physical Exam  BP (!) 141/107   Pulse 82   Temp 98 F (36.7 C)   Resp 18   SpO2 93%   Physical Exam Constitutional:      General: He is not in acute distress.    Appearance: Normal appearance.  HENT:     Head: Normocephalic and atraumatic.     Nose: No congestion or rhinorrhea.  Eyes:     General:        Right eye: No discharge.        Left eye: No discharge.     Extraocular Movements: Extraocular movements intact.     Pupils: Pupils are equal, round, and reactive to light.  Cardiovascular:     Rate and Rhythm: Normal rate and regular rhythm.     Heart sounds: No murmur heard. Pulmonary:     Effort: No respiratory distress.     Breath sounds: No wheezing or rales.  Abdominal:     General: There is no distension.     Tenderness: There is no abdominal tenderness.  Musculoskeletal:        General: Normal range of motion.     Cervical back: Normal range of motion.  Skin:    General: Skin is warm and dry.  Neurological:     General: No focal deficit present.     Mental Status: He is alert.     Procedures  Procedures  ED Course / MDM    Medical Decision Making Amount and/or Complexity of Data Reviewed Labs: ordered. Radiology: ordered.  Risk Prescription drug management.   Received in handoff.  Reactive airway disease and wheezing already received 3 DuoNebs, magnesium  and a continuous albuterol  therapy.  Plan for reevaluation at time of signout.  On reevaluation, wheezing has resolved and patient feeling significant improved.  Saturating 93% on room air and with ambulation.  Currently does not meet inpatient criteria for admission and already has outpatient scheduled pulmonology appointment.  Chest x-ray concerning for more likely chronic interstitial disease and patient would benefit from this pulmonology follow-up of which is already scheduled.  Rescue inhaler brought to patient's bedside and patient discharged with steroids.  Return precautions given which she  voiced understanding       Albertina Dixon, MD 05/18/23 [phone]

## 2023-05-23 ENCOUNTER — Telehealth: Payer: Self-pay

## 2023-05-23 ENCOUNTER — Other Ambulatory Visit (HOSPITAL_COMMUNITY): Payer: Self-pay

## 2023-05-23 DIAGNOSIS — J4550 Severe persistent asthma, uncomplicated: Secondary | ICD-10-CM

## 2023-05-23 NOTE — Telephone Encounter (Addendum)
 Received notification from [facility] that an unusual message is displaying upon attempting to bill medication (see below) at this time it is unclear if pt simply needs a new PA or if his insurance has implemented pharmacy restrictions for 2025. Will attempt PA renewal first.    Submitted a Prior Authorization request to [facility] [place]  for DUPIXENT  via CoverMyMeds. Will update once we receive a response.  Key: [id number]

## 2023-05-23 NOTE — Telephone Encounter (Signed)
 Received notification from [facility] [employer] regarding a prior authorization for DUPIXENT . Authorization has been APPROVED from 04/23/2023 to 05/22/2024. Approval letter sent to scan center.  Authorization # [id number]  Sherry Pennant, PharmD, MPH, BCPS, CPP Clinical Pharmacist (Rheumatology and Pulmonology)

## 2023-05-24 ENCOUNTER — Other Ambulatory Visit (HOSPITAL_COMMUNITY): Payer: Self-pay

## 2023-05-25 ENCOUNTER — Other Ambulatory Visit (HOSPITAL_COMMUNITY): Payer: Self-pay

## 2023-05-27 ENCOUNTER — Other Ambulatory Visit: Payer: Self-pay

## 2023-05-27 ENCOUNTER — Other Ambulatory Visit (HOSPITAL_COMMUNITY): Payer: Self-pay

## 2023-05-27 LAB — PATHOLOGIST SMEAR REVIEW

## 2023-05-27 NOTE — Progress Notes (Signed)
Refill cancelled and [facility] disenrolled due to insurance restriction and he notified patient.

## 2023-05-27 NOTE — Telephone Encounter (Signed)
Spoke with rep from [employer] and determined that pt will need to fill his Dupixent with [facility] going forward. Called pt and notified. Advised that it would probably be best for him to call on Monday and that I would send him their phone number and his copay card info through MyChart. Pt verbalized understanding to all.

## 2023-05-30 ENCOUNTER — Other Ambulatory Visit: Payer: Self-pay

## 2023-05-30 MED ORDER — DUPIXENT 300 MG/2ML ~~LOC~~ SOAJ: 300.0000 mg | SUBCUTANEOUS | 1 refills | Status: DC

## 2023-05-30 NOTE — Telephone Encounter (Signed)
Rx for Dupixent maintenance dose sent to [facility]  Chesley Mires, PharmD, MPH, BCPS, CPP Clinical Pharmacist (Rheumatology and Pulmonology)

## 2023-05-31 ENCOUNTER — Encounter: Payer: Self-pay | Admitting: Emergency Medicine

## 2023-05-31 ENCOUNTER — Other Ambulatory Visit: Payer: Self-pay

## 2023-05-31 ENCOUNTER — Ambulatory Visit
Admission: EM | Admit: 2023-05-31 | Discharge: 2023-05-31 | Disposition: A | Discharge status: Home / Self Care | Payer: BC Managed Care – PPO | Attending: Family Medicine | Admitting: Family Medicine

## 2023-05-31 ENCOUNTER — Ambulatory Visit: Payer: Self-pay

## 2023-05-31 DIAGNOSIS — J4551 Severe persistent asthma with (acute) exacerbation: Secondary | ICD-10-CM | POA: Diagnosis not present

## 2023-05-31 MED ORDER — DEXAMETHASONE SODIUM PHOSPHATE 10 MG/ML IJ SOLN
10.0000 mg | Freq: Once | INTRAMUSCULAR | Status: AC
  Administered 2023-05-31: 10 mg via INTRAMUSCULAR

## 2023-05-31 MED ORDER — PREDNISONE 20 MG PO TABS: 40.0000 mg | ORAL_TABLET | Freq: Every day | ORAL | 0 refills | Status: DC

## 2023-05-31 MED ORDER — METHYLPREDNISOLONE SODIUM SUCC 125 MG IJ SOLR
80.0000 mg | Freq: Once | INTRAMUSCULAR | Status: DC
Start: 2023-05-31 — End: 2023-05-31

## 2023-05-31 NOTE — ED Triage Notes (Signed)
History of pneumonia 1 month ago. States he continues to have a cough and feel SOB.  States hurts to breath in on left side.

## 2023-05-31 NOTE — ED Provider Notes (Signed)
[facility]    CSN: [id number] Arrival date & time: 05/31/23  0907      History   Chief Complaint No chief complaint on file.   HPI Drew Sandoval is a 48 y.o. male.   Patient presenting today for ongoing shortness of breath, wheezing, chest tightness.  Seen in the emergency department about a month ago, treated for pneumonia and again seen about a week or so ago.  Was given a steroid shot and then emergency department and 4 days of prednisone.  States he felt improved while on the medications but since stopping the prednisone his symptoms have returned.  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  Has upcoming pulmonology follow-up next month.    Past Medical History:  Diagnosis Date   Acute conjunctivitis of right eye 10/07/2014   Acute respiratory failure with hypoxia (HCC) 07/23/2013   Asthma    Asthma    Phreesia 06/26/2020   Asthma, severe persistent 07/16/2010   PFT >> spiro 2012 with FEV1 1.14 (28%), ratio 45 Alpha 1 (08/12/10) >>MM ([employer])  IGE 1747, positive allergy profile >> Elevated eos on CBC S/p intubation 10/2010 Job exposure to respiratory irritant - bleach, chicken Allergy vaccine begun 11/09/2010. DC'd by pt 2013- resumed 2013> 1:50 GH; DC'd -[facility] 9/24-9/27/ 2013- Status asthma, no vent. [facility] 2015- status ast   COVID-19 virus infection 06/17/2020   January, 2022. 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   SVT (supraventricular tachycardia) (HCC) 09/24/2014    Patient Active Problem List   Diagnosis Date Noted   Acute  respiratory failure with hypoxia (HCC) 04/20/2023   Lobar pneumonia (HCC) 04/20/2023   Acute asthma exacerbation 02/04/2023   Acute bronchitis 12/24/2022   Aspiration pneumonia (HCC) 08/26/2021   Hematuria 06/11/2021   Periumbilical abdominal pain 06/11/2021   Thyroid nodule 11/18/2020   Essential tremor 06/27/2020   Hypertension, essential 06/17/2020   Gynecomastia, male 04/24/2018   Sustained SVT (HCC) 09/24/2014   SVT (supraventricular tachycardia) (HCC) 09/24/2014   Malnutrition of moderate degree (HCC) 09/24/2014   Seasonal and perennial allergic rhinitis 07/17/2014   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 Authorizing Provider  predniSONE (DELTASONE) 20 MG tablet Take 2 tablets (40 mg total) by mouth daily with breakfast. 05/31/23  Yes Particia Nearing, PA-C  acetaminophen (TYLENOL) 325 MG tablet Take 2 tablets (650 mg total) by mouth every 6 (six) hours as needed for mild pain, headache or fever (or Fever >/= 101). 02/05/23   Almon Hercules, MD  acetaminophen (TYLENOL) 500 MG tablet Take 1,000  mg by mouth every 6 (six) hours as needed for moderate pain.    [provider]  albuterol (VENTOLIN HFA) 108 (90 Base) MCG/ACT inhaler Inhale 2 puffs into the lungs every 6 (six) hours as needed for wheezing or shortness of breath. 11/22/22   Leath-Warren, Sadie Haber, NP  amLODipine (NORVASC) 5 MG tablet TAKE 1 TABLET (5 MG TOTAL) BY MOUTH DAILY. Patient taking differently: Take 5 mg by mouth as needed (high  blood pressure). 02/12/22   Marinus Maw, MD  benzonatate (TESSALON) 100 MG capsule Take 1 capsule (100 mg total) by mouth every 8 (eight) hours. 05/09/23   Eber Hong, MD  Budeson-Glycopyrrol-Formoterol (BREZTRI AEROSPHERE) 160-9-4.8 MCG/ACT AERO Inhale 2 puffs into the lungs 2 (two) times daily. Rinse mouth 12/24/22   Glenford Bayley, NP  doxycycline (VIBRAMYCIN) 100 MG capsule Take 1 capsule (100 mg total) by mouth 2 (two) times daily. 05/09/23   Eber Hong, MD  Dupilumab (DUPIXENT) 300 MG/2ML SOAJ Inject 300 mg into the skin every 14 (fourteen) days. **does not need loading dose** 05/30/23   Young, Clinton D, MD  Ensifentrine [facility]) 3 MG/2.5ML SUSP Inhale 2.5 mLs into the lungs 2 (two) times daily. 03/28/23   Jetty Duhamel D, MD  EPINEPHrine 0.3 mg/0.3 mL IJ SOAJ injection Inject into thigh for severe allergic reaction 09/28/22   Jetty Duhamel D, MD  levalbuterol (XOPENEX) 0.31 MG/3ML nebulizer solution Take 3 mLs (0.31 mg total) by nebulization every 4 (four) hours as needed for wheezing. 09/24/22   Kommor, Madison, MD  levofloxacin (LEVAQUIN) 750 MG tablet Take 1 tablet (750 mg total) by mouth daily. 04/21/23   Catarina Hartshorn, MD  montelukast (SINGULAIR) 10 MG tablet Take 1 tablet (10 mg total) by mouth at bedtime. Patient taking differently: Take 10 mg by mouth daily. 12/24/22   Glenford Bayley, NP  primidone (MYSOLINE) 50 MG tablet TAKE 4 TABLETS TWICE A DAY 03/07/23   Tat, Octaviano Batty, DO    Family History Family History  Problem Relation Age of Onset   Lung cancer Mother    Emphysema Father    Arthritis Sister    Diabetes Maternal Grandmother    Thyroid disease Neg Hx     Social History Social History   Tobacco Use   Smoking status: Former    Current packs/day: 0.00    Average packs/day: 0.5 packs/day for 11.5 years (5.8 ttl pk-yrs)    Types: Cigarettes    Start date: 10/09/1998    Quit date: 04/09/2010    Years since quitting: 13.1   Smokeless tobacco: Former    Tobacco comments:    started at age 65.  1/2 ppd.    Vaping Use   Vaping status: Never Used  Substance Use Topics   Alcohol use: Yes    Comment: rarely   Drug use: No     Allergies   Azithromycin, Banana, Alvesco [ciclesonide], Anoro ellipta [umeclidinium-vilanterol], and Penicillins   Review of Systems Review of Systems PER HPI  Physical Exam Triage Vital Signs ED Triage Vitals  Encounter Vitals Group     BP 05/31/23 0951 (!) 146/94     Systolic BP Percentile --      Diastolic BP Percentile --      Pulse Rate 05/31/23 0951 82     Resp 05/31/23 0951 18     Temp 05/31/23 0951 98.5 F (36.9 C)     Temp Source 05/31/23 0951 Oral     SpO2 05/31/23 0953 92 %  Weight --      Height --      Head Circumference --      Peak Flow --      Pain Score 05/31/23 0953 9     Pain Loc --      Pain Education --      Exclude from Growth Chart --    No data found.  Updated Vital Signs BP (!) 146/94 (BP Location: Right Arm)   Pulse 82   Temp 98.5 F (36.9 C) (Oral)   Resp 18   SpO2 92%   Visual Acuity Right Eye Distance:   Left Eye Distance:   Bilateral Distance:    Right Eye Near:   Left Eye Near:    Bilateral Near:     Physical Exam Vitals and nursing note reviewed.  Constitutional:      Appearance: He is well-developed.  HENT:     Head: Atraumatic.     Right Ear: External ear normal.     Left Ear: External ear normal.     Nose: Nose normal.     Mouth/Throat:     Mouth: Mucous membranes are moist.     Pharynx: Oropharynx is clear. No oropharyngeal exudate or posterior oropharyngeal erythema.  Eyes:     Conjunctiva/sclera: Conjunctivae normal.     Pupils: Pupils are equal, round, and reactive to light.  Cardiovascular:     Rate and Rhythm: Normal rate and regular rhythm.  Pulmonary:     Effort: Pulmonary effort is normal. No respiratory distress.     Breath sounds: Wheezing present. No rales.  Musculoskeletal:        General: Normal range of motion.      Cervical back: Normal range of motion and neck supple.  Lymphadenopathy:     Cervical: No cervical adenopathy.  Skin:    General: Skin is warm and dry.  Neurological:     Mental Status: He is alert and oriented to person, place, and time.  Psychiatric:        Behavior: Behavior normal.      UC Treatments / Results  Labs (all labs ordered are listed, but only abnormal results are displayed) Labs Reviewed - No data to display  EKG   Radiology No results found.  Procedures Procedures (including critical care time)  Medications Ordered in UC Medications  dexamethasone (DECADRON) injection 10 mg (10 mg Intramuscular Given 05/31/23 1016)    Initial Impression / Assessment and Plan / UC Course  I have reviewed the triage vital signs and the nursing notes.  Pertinent labs & imaging results that were available during my care of the patient were reviewed by me and considered in my medical decision making (see chart for details).     Oxygen saturation 92% at rest on room air, he is well-appearing and in no acute distress.  Will treat with IM Decadron, extended prednisone course, continued asthma and allergy regimen and close pulmonology follow-up.  Return for worsening symptoms.  Final Clinical Impressions(s) / UC Diagnoses   Final diagnoses:  Severe persistent asthma with acute exacerbation   Discharge Instructions   None    ED Prescriptions     Medication Sig Dispense Auth. Provider   predniSONE (DELTASONE) 20 MG tablet Take 2 tablets (40 mg total) by mouth daily with breakfast. 14 tablet Particia Nearing, [place]      PDMP not reviewed this encounter.   Particia Nearing, [place] 05/31/23 1928

## 2023-06-04 ENCOUNTER — Inpatient Hospital Stay (HOSPITAL_COMMUNITY)
Admission: EM | Admit: 2023-06-04 | Discharge: 2023-06-06 | DRG: 193 | Disposition: A | Discharge status: Home / Self Care | Payer: BC Managed Care – PPO | Attending: Internal Medicine | Admitting: Internal Medicine

## 2023-06-04 ENCOUNTER — Emergency Department (HOSPITAL_COMMUNITY): Payer: BC Managed Care – PPO

## 2023-06-04 ENCOUNTER — Other Ambulatory Visit: Payer: Self-pay

## 2023-06-04 ENCOUNTER — Encounter (HOSPITAL_COMMUNITY): Payer: Self-pay | Admitting: Emergency Medicine

## 2023-06-04 DIAGNOSIS — Z91018 Allergy to other foods: Secondary | ICD-10-CM

## 2023-06-04 DIAGNOSIS — Z8616 Personal history of COVID-19: Secondary | ICD-10-CM | POA: Diagnosis not present

## 2023-06-04 DIAGNOSIS — Z881 Allergy status to other antibiotic agents status: Secondary | ICD-10-CM

## 2023-06-04 DIAGNOSIS — Z880 Allergy status to penicillin: Secondary | ICD-10-CM | POA: Diagnosis not present

## 2023-06-04 DIAGNOSIS — Z833 Family history of diabetes mellitus: Secondary | ICD-10-CM | POA: Diagnosis not present

## 2023-06-04 DIAGNOSIS — J168 Pneumonia due to other specified infectious organisms: Secondary | ICD-10-CM | POA: Diagnosis not present

## 2023-06-04 DIAGNOSIS — Z87891 Personal history of nicotine dependence: Secondary | ICD-10-CM

## 2023-06-04 DIAGNOSIS — Z79899 Other long term (current) drug therapy: Secondary | ICD-10-CM | POA: Diagnosis not present

## 2023-06-04 DIAGNOSIS — I471 Supraventricular tachycardia, unspecified: Secondary | ICD-10-CM | POA: Diagnosis present

## 2023-06-04 DIAGNOSIS — J181 Lobar pneumonia, unspecified organism: Principal | ICD-10-CM | POA: Diagnosis present

## 2023-06-04 DIAGNOSIS — J4551 Severe persistent asthma with (acute) exacerbation: Secondary | ICD-10-CM | POA: Diagnosis present

## 2023-06-04 DIAGNOSIS — Z801 Family history of malignant neoplasm of trachea, bronchus and lung: Secondary | ICD-10-CM | POA: Diagnosis not present

## 2023-06-04 DIAGNOSIS — Z7951 Long term (current) use of inhaled steroids: Secondary | ICD-10-CM | POA: Diagnosis not present

## 2023-06-04 DIAGNOSIS — J4550 Severe persistent asthma, uncomplicated: Secondary | ICD-10-CM | POA: Diagnosis present

## 2023-06-04 DIAGNOSIS — D824 Hyperimmunoglobulin E [IgE] syndrome: Secondary | ICD-10-CM | POA: Diagnosis present

## 2023-06-04 DIAGNOSIS — E041 Nontoxic single thyroid nodule: Secondary | ICD-10-CM | POA: Diagnosis not present

## 2023-06-04 DIAGNOSIS — G250 Essential tremor: Secondary | ICD-10-CM | POA: Diagnosis present

## 2023-06-04 DIAGNOSIS — Z825 Family history of asthma and other chronic lower respiratory diseases: Secondary | ICD-10-CM | POA: Diagnosis not present

## 2023-06-04 DIAGNOSIS — Z1152 Encounter for screening for COVID-19: Secondary | ICD-10-CM | POA: Diagnosis not present

## 2023-06-04 DIAGNOSIS — J9601 Acute respiratory failure with hypoxia: Secondary | ICD-10-CM | POA: Diagnosis present

## 2023-06-04 DIAGNOSIS — Z7952 Long term (current) use of systemic steroids: Secondary | ICD-10-CM

## 2023-06-04 DIAGNOSIS — J189 Pneumonia, unspecified organism: Principal | ICD-10-CM

## 2023-06-04 DIAGNOSIS — I10 Essential (primary) hypertension: Secondary | ICD-10-CM | POA: Diagnosis present

## 2023-06-04 DIAGNOSIS — R0781 Pleurodynia: Secondary | ICD-10-CM | POA: Diagnosis not present

## 2023-06-04 DIAGNOSIS — J984 Other disorders of lung: Secondary | ICD-10-CM | POA: Diagnosis not present

## 2023-06-04 DIAGNOSIS — Z8261 Family history of arthritis: Secondary | ICD-10-CM

## 2023-06-04 DIAGNOSIS — Z888 Allergy status to other drugs, medicaments and biological substances status: Secondary | ICD-10-CM

## 2023-06-04 DIAGNOSIS — Z8679 Personal history of other diseases of the circulatory system: Secondary | ICD-10-CM | POA: Diagnosis not present

## 2023-06-04 DIAGNOSIS — R0602 Shortness of breath: Secondary | ICD-10-CM | POA: Diagnosis not present

## 2023-06-04 DIAGNOSIS — J45901 Unspecified asthma with (acute) exacerbation: Secondary | ICD-10-CM | POA: Diagnosis present

## 2023-06-04 DIAGNOSIS — D721 Eosinophilia, unspecified: Secondary | ICD-10-CM | POA: Diagnosis present

## 2023-06-04 LAB — CBC
HCT: 41.5 % (ref 39.0–52.0)
Hemoglobin: 13.4 g/dL (ref 13.0–17.0)
MCH: 30.5 pg (ref 26.0–34.0)
MCHC: 32.3 g/dL (ref 30.0–36.0)
MCV: 94.5 fL (ref 80.0–100.0)
Platelets: 254 10*3/uL (ref 150–400)
RBC: 4.39 MIL/uL (ref 4.22–5.81)
RDW: 15.2 % (ref 11.5–15.5)
WBC: 16.8 10*3/uL — ABNORMAL HIGH (ref 4.0–10.5)
nRBC: 0 % (ref 0.0–0.2)

## 2023-06-04 LAB — RESP PANEL BY RT-PCR (RSV, FLU A&B, COVID)  RVPGX2
Influenza A by PCR: NEGATIVE
Influenza B by PCR: NEGATIVE
Resp Syncytial Virus by PCR: NEGATIVE
SARS Coronavirus 2 by RT PCR: NEGATIVE

## 2023-06-04 LAB — BASIC METABOLIC PANEL
Anion gap: 10 (ref 5–15)
BUN: 16 mg/dL (ref 6–20)
CO2: 25 mmol/L (ref 22–32)
Calcium: 8.7 mg/dL — ABNORMAL LOW (ref 8.9–10.3)
Chloride: 105 mmol/L (ref 98–111)
Creatinine, Ser: 1.12 mg/dL (ref 0.61–1.24)
GFR, Estimated: >60 mL/min (ref >60)
Glucose, Bld: 93 mg/dL (ref 70–99)
Potassium: 3.6 mmol/L (ref 3.5–5.1)
Sodium: 140 mmol/L (ref 135–145)

## 2023-06-04 LAB — RESPIRATORY PANEL BY PCR

## 2023-06-04 LAB — STREP PNEUMONIAE URINARY ANTIGEN: Strep Pneumo Urinary Antigen: NEGATIVE

## 2023-06-04 LAB — PROCALCITONIN: Procalcitonin: <0.1 ng/mL

## 2023-06-04 MED ORDER — ENOXAPARIN SODIUM 40 MG/0.4ML IJ SOSY
40.0000 mg | PREFILLED_SYRINGE | INTRAMUSCULAR | Status: DC
Start: 2023-06-04 — End: 2023-06-06
  Administered 2023-06-04 – 2023-06-05 (×2): 40 mg via SUBCUTANEOUS
  Filled 2023-06-04 (×2): qty 0.4

## 2023-06-04 MED ORDER — ONDANSETRON HCL 4 MG PO TABS: 4.0000 mg | ORAL_TABLET | Freq: Four times a day (QID) | ORAL | Status: DC | PRN

## 2023-06-04 MED ORDER — MONTELUKAST SODIUM 10 MG PO TABS
10.0000 mg | ORAL_TABLET | Freq: Every day | ORAL | Status: DC
Start: 2023-06-04 — End: 2023-06-06
  Administered 2023-06-04 – 2023-06-05 (×2): 10 mg via ORAL
  Filled 2023-06-04 (×2): qty 1

## 2023-06-04 MED ORDER — KETOROLAC TROMETHAMINE 30 MG/ML IJ SOLN
30.0000 mg | Freq: Four times a day (QID) | INTRAMUSCULAR | Status: DC | PRN
  Administered 2023-06-04 – 2023-06-05 (×3): 30 mg via INTRAVENOUS
  Filled 2023-06-04 (×3): qty 1

## 2023-06-04 MED ORDER — OXYCODONE HCL 5 MG PO TABS
5.0000 mg | ORAL_TABLET | Freq: Four times a day (QID) | ORAL | Status: DC | PRN
  Administered 2023-06-04 – 2023-06-05 (×5): 5 mg via ORAL
  Filled 2023-06-04 (×5): qty 1

## 2023-06-04 MED ORDER — PRIMIDONE 50 MG PO TABS
200.0000 mg | ORAL_TABLET | Freq: Two times a day (BID) | ORAL | Status: DC
  Administered 2023-06-04 – 2023-06-06 (×4): 200 mg via ORAL
  Filled 2023-06-04 (×4): qty 4

## 2023-06-04 MED ORDER — METHYLPREDNISOLONE SODIUM SUCC 125 MG IJ SOLR
125.0000 mg | Freq: Once | INTRAMUSCULAR | Status: DC
  Filled 2023-06-04: qty 2

## 2023-06-04 MED ORDER — ACETAMINOPHEN 325 MG PO TABS
650.0000 mg | ORAL_TABLET | Freq: Four times a day (QID) | ORAL | Status: DC | PRN
  Administered 2023-06-05: 650 mg via ORAL
  Filled 2023-06-04: qty 2

## 2023-06-04 MED ORDER — DEXAMETHASONE 4 MG PO TABS
6.0000 mg | ORAL_TABLET | Freq: Every day | ORAL | Status: DC
  Administered 2023-06-05 – 2023-06-06 (×2): 6 mg via ORAL
  Filled 2023-06-04 (×2): qty 2

## 2023-06-04 MED ORDER — LIDOCAINE 5 % EX PTCH
1.0000 | MEDICATED_PATCH | CUTANEOUS | Status: DC
  Administered 2023-06-04: 1 via TRANSDERMAL
  Filled 2023-06-04 (×3): qty 1

## 2023-06-04 MED ORDER — IPRATROPIUM-ALBUTEROL 0.5-2.5 (3) MG/3ML IN SOLN
3.0000 mL | Freq: Once | RESPIRATORY_TRACT | Status: AC
  Administered 2023-06-04: 3 mL via RESPIRATORY_TRACT
  Filled 2023-06-04: qty 3

## 2023-06-04 MED ORDER — IPRATROPIUM BROMIDE 0.02 % IN SOLN
0.5000 mg | Freq: Once | RESPIRATORY_TRACT | Status: AC
  Administered 2023-06-04: 0.5 mg via RESPIRATORY_TRACT
  Filled 2023-06-04: qty 2.5

## 2023-06-04 MED ORDER — BUDESONIDE 0.25 MG/2ML IN SUSP
0.2500 mg | Freq: Two times a day (BID) | RESPIRATORY_TRACT | Status: DC
  Administered 2023-06-04 – 2023-06-06 (×4): 0.25 mg via RESPIRATORY_TRACT
  Filled 2023-06-04 (×4): qty 2

## 2023-06-04 MED ORDER — LEVALBUTEROL HCL 0.63 MG/3ML IN NEBU: 0.6300 mg | INHALATION_SOLUTION | Freq: Four times a day (QID) | RESPIRATORY_TRACT | Status: DC | PRN

## 2023-06-04 MED ORDER — AMLODIPINE BESYLATE 5 MG PO TABS
5.0000 mg | ORAL_TABLET | Freq: Every day | ORAL | Status: DC
  Administered 2023-06-04 – 2023-06-06 (×3): 5 mg via ORAL
  Filled 2023-06-04 (×3): qty 1

## 2023-06-04 MED ORDER — LEVOFLOXACIN IN D5W 750 MG/150ML IV SOLN
750.0000 mg | Freq: Once | INTRAVENOUS | Status: DC
  Filled 2023-06-04: qty 150

## 2023-06-04 MED ORDER — SODIUM CHLORIDE 0.9 % IV SOLN
2.0000 g | Freq: Once | INTRAVENOUS | Status: AC
  Administered 2023-06-04: 2 g via INTRAVENOUS
  Filled 2023-06-04: qty 12.5

## 2023-06-04 MED ORDER — DEXAMETHASONE SODIUM PHOSPHATE 10 MG/ML IJ SOLN
10.0000 mg | Freq: Once | INTRAMUSCULAR | Status: AC
  Administered 2023-06-04: 10 mg via INTRAVENOUS
  Filled 2023-06-04: qty 1

## 2023-06-04 MED ORDER — DM-GUAIFENESIN ER 30-600 MG PO TB12
1.0000 | ORAL_TABLET | Freq: Two times a day (BID) | ORAL | Status: DC
  Administered 2023-06-04 – 2023-06-06 (×5): 1 via ORAL
  Filled 2023-06-04 (×5): qty 1

## 2023-06-04 MED ORDER — LEVOFLOXACIN IN D5W 750 MG/150ML IV SOLN
750.0000 mg | INTRAVENOUS | Status: DC
  Administered 2023-06-05: 750 mg via INTRAVENOUS
  Filled 2023-06-04: qty 150

## 2023-06-04 MED ORDER — ONDANSETRON HCL 4 MG/2ML IJ SOLN: 4.0000 mg | Freq: Four times a day (QID) | INTRAMUSCULAR | Status: DC | PRN

## 2023-06-04 MED ORDER — PANTOPRAZOLE SODIUM 40 MG PO TBEC
40.0000 mg | DELAYED_RELEASE_TABLET | Freq: Every day | ORAL | Status: DC
  Administered 2023-06-04 – 2023-06-06 (×3): 40 mg via ORAL
  Filled 2023-06-04 (×3): qty 1

## 2023-06-04 MED ORDER — IPRATROPIUM-ALBUTEROL 0.5-2.5 (3) MG/3ML IN SOLN: 3.0000 mL | Freq: Four times a day (QID) | RESPIRATORY_TRACT | Status: DC | PRN

## 2023-06-04 MED ORDER — ALBUTEROL SULFATE (2.5 MG/3ML) 0.083% IN NEBU
5.0000 mg | INHALATION_SOLUTION | Freq: Once | RESPIRATORY_TRACT | Status: AC
  Administered 2023-06-04: 5 mg via RESPIRATORY_TRACT
  Filled 2023-06-04: qty 6

## 2023-06-04 MED ORDER — ACETAMINOPHEN 650 MG RE SUPP: 650.0000 mg | Freq: Four times a day (QID) | RECTAL | Status: DC | PRN

## 2023-06-04 NOTE — Progress Notes (Signed)
Patient arrived to unit. Assessed patient with Georgia Lopes LPN. Patient was found to be 87% on 2l, applied 3l, improved to 92%. Notified Dr. Sherryll Burger of this and patient's continued to pain when breathing. New orders. Patient expressed his pain as a 10, provided IV medication. Patient has call bell within reach to notify us if any changes and has been oriented to the room. Added humidity to patient's o2 as he stated he is prone to dry nose/nose bleeds.

## 2023-06-04 NOTE — ED Triage Notes (Signed)
Pt endorses sharp left sided rib pain when breathing in for a week. Endorses asthma and SOB for longer. Seen at UC recently and started on steroids. States no relief with meds.

## 2023-06-04 NOTE — Plan of Care (Signed)

## 2023-06-04 NOTE — ED Notes (Signed)
Patient transported to X-ray

## 2023-06-04 NOTE — H&P (Signed)
History and Physical    Drew Sandoval ZOX:[id number] DOB: [birth year] DOA: 06/04/2023  PCP: Pcp, No   Patient coming from: Home  Chief Complaint: Persistent shortness of breath  HPI: Drew Sandoval is a 48 y.o. male with medical history significant for severe persistent asthma, hyper IgE syndrome, SVT status post ablation, hypertension, and essential tremor who has apparently been struggling with waxing and waning symptoms of shortness of breath along with cough over the last 1.5 months.  He states he has had worsening shortness of breath as well as left-sided chest pain that has worsened over the last several days.  He denies any fevers or chills and continues to cough up clear phlegm.  He was recently discharged on 04/20/2023 after treatment for pneumonia with asthma exacerbation at that time.  He has been seen in the ED several times since then and despite treatment with courses of steroids and breathing treatments, he continues to have persistent symptoms.   ED Course: Vital signs stable and patient afebrile.  Noted to have hypoxemia into the mid 80th percentile on room air and therefore requiring some nasal cannula supplementation.  Leukocytosis of 16,800 noted.  Chest x-ray with some left-sided patchy pneumonia noted.  He has received some breathing treatments and continues to complain of pain to the left chest wall despite placement of Lidoderm patch.  Review of Systems: Reviewed as noted above, otherwise negative.  Past Medical History:  Diagnosis Date   Acute conjunctivitis of right eye 10/07/2014   Acute respiratory failure with hypoxia (HCC) 07/23/2013   Asthma    Asthma    Phreesia 06/26/2020   Asthma, severe persistent 07/16/2010   PFT >> spiro 2012 with FEV1 1.14 (28%), ratio 45 Alpha 1 (08/12/10) >>MM ([employer])  IGE 1747, positive allergy profile >> Elevated eos on CBC S/p intubation 10/2010 Job exposure to respiratory irritant - bleach, chicken Allergy vaccine  begun 11/09/2010. DC'd by pt 2013- resumed 2013> 1:50 GH; DC'd -[facility] 9/24-9/27/ 2013- Status asthma, no vent. [facility]- status ast   COVID-19 virus infection 06/17/2020   January, 2022. 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 His smoking use included cigarettes. He started smoking about 24 years ago. He has a 5.8 pack-year smoking history. He has quit using smokeless tobacco. He reports current alcohol use. He reports that he does not use drugs.  Allergies  Allergen Reactions   Azithromycin Rash   Banana Swelling    Throat swelling    Solu-Medrol [Methylprednisolone] Other (See Comments)    States his whole body itches   Alvesco [Ciclesonide] Rash    Possible reaction- rash   Anoro Ellipta [Umeclidinium-Vilanterol] Rash    Possible- rash   Penicillins Rash    Family History  Problem Relation Age of Onset  Lung cancer Mother    Emphysema Father    Arthritis Sister    Diabetes Maternal Grandmother    Thyroid disease Neg Hx     Prior to Admission medications   Medication Sig Start Date End Date Taking? Authorizing Provider  acetaminophen (TYLENOL) 325 MG tablet Take 2 tablets (650 mg total) by mouth every 6 (six) hours as needed for mild pain, headache or fever (or Fever >/= 101). 02/05/23   Almon Hercules, MD   acetaminophen (TYLENOL) 500 MG tablet Take 1,000 mg by mouth every 6 (six) hours as needed for moderate pain.    [provider]  albuterol (VENTOLIN HFA) 108 (90 Base) MCG/ACT inhaler Inhale 2 puffs into the lungs every 6 (six) hours as needed for wheezing or shortness of breath. 11/22/22   Leath-Warren, Sadie Haber, NP  amLODipine (NORVASC) 5 MG tablet TAKE 1 TABLET (5 MG TOTAL) BY MOUTH DAILY. Patient taking differently: Take 5 mg by mouth as needed (high blood pressure). 02/12/22   Marinus Maw, MD  benzonatate (TESSALON) 100 MG capsule Take 1 capsule (100 mg total) by mouth every 8 (eight) hours. 05/09/23   Eber Hong, MD  Budeson-Glycopyrrol-Formoterol (BREZTRI AEROSPHERE) 160-9-4.8 MCG/ACT AERO Inhale 2 puffs into the lungs 2 (two) times daily. Rinse mouth 12/24/22   Glenford Bayley, NP  doxycycline (VIBRAMYCIN) 100 MG capsule Take 1 capsule (100 mg total) by mouth 2 (two) times daily. 05/09/23   Eber Hong, MD  Dupilumab (DUPIXENT) 300 MG/2ML SOAJ Inject 300 mg into the skin every 14 (fourteen) days. **does not need loading dose** 05/30/23   Young, Clinton D, MD  Ensifentrine [facility]) 3 MG/2.5ML SUSP Inhale 2.5 mLs into the lungs 2 (two) times daily. 03/28/23   Jetty Duhamel D, MD  EPINEPHrine 0.3 mg/0.3 mL IJ SOAJ injection Inject into thigh for severe allergic reaction 09/28/22   Jetty Duhamel D, MD  levalbuterol (XOPENEX) 0.31 MG/3ML nebulizer solution Take 3 mLs (0.31 mg total) by nebulization every 4 (four) hours as needed for wheezing. 09/24/22   Kommor, Madison, MD  levofloxacin (LEVAQUIN) 750 MG tablet Take 1 tablet (750 mg total) by mouth daily. 04/21/23   Catarina Hartshorn, MD  montelukast (SINGULAIR) 10 MG tablet Take 1 tablet (10 mg total) by mouth at bedtime. Patient taking differently: Take 10 mg by mouth daily. 12/24/22   Glenford Bayley, NP  predniSONE (DELTASONE) 20 MG tablet Take 2 tablets (40 mg total) by mouth daily with breakfast. 05/31/23   Particia Nearing, PA-C  primidone (MYSOLINE) 50 MG tablet TAKE 4 TABLETS TWICE A DAY 03/07/23   Vladimir Faster, DO    Physical Exam: Vitals:   06/04/23 0930 06/04/23 1000 06/04/23 1030 06/04/23 1048  BP: 125/89 (!) 155/87 (!) 152/84 (!) 159/91  Pulse: 74 92 79 76  Resp: (!) 24 (!) 22 (!) 22 20  Temp:    98.1 F (36.7 C)  TempSrc:    Oral  SpO2: 91% 93% 91% 92%  Weight:      Height:        Constitutional: NAD, calm, comfortable Vitals:   06/04/23 0930 06/04/23 1000 06/04/23 1030 06/04/23 1048  BP: 125/89 (!) 155/87 (!) 152/84 (!) 159/91  Pulse: 74 92 79 76  Resp: (!) 24 (!) 22 (!) 22 20  Temp:    98.1 F (36.7 C)  TempSrc:    Oral  SpO2: 91% 93% 91% 92%  Weight:      Height:  Eyes: lids and conjunctivae normal Neck: normal, supple Respiratory: clear to auscultation bilaterally. Normal respiratory effort. No accessory muscle use.  Currently on 3 L nasal cannula. Cardiovascular: Regular rate and rhythm, no murmurs. Abdomen: no tenderness, no distention. Bowel sounds positive.  Musculoskeletal:  No edema. Skin: no rashes, lesions, ulcers.  Psychiatric: Flat affect  Labs on Admission: I have personally reviewed following labs and imaging studies  CBC: Recent Labs  Lab 06/04/23 0923  WBC 16.8*  HGB 13.4  HCT 41.5  MCV 94.5  PLT 254   Basic Metabolic Panel: Recent Labs  Lab 06/04/23 0923  NA 140  K 3.6  CL 105  CO2 25  GLUCOSE 93  BUN 16  CREATININE 1.12  CALCIUM 8.7*   GFR: Estimated Creatinine Clearance: 97.5 mL/min (by C-G formula based on SCr of 1.12 mg/dL). Liver Function Tests: No results for input(s): "AST", "ALT", "ALKPHOS", "BILITOT", "PROT", "ALBUMIN" in the last 168 hours. No results for input(s): "LIPASE", "AMYLASE" in the last 168 hours. No results for input(s): "AMMONIA" in the last 168 hours. Coagulation Profile: No results for input(s): "INR", "PROTIME" in the last 168 hours. Cardiac Enzymes: No results for input(s): "CKTOTAL", "CKMB",  "CKMBINDEX", "TROPONINI" in the last 168 hours. BNP (last 3 results) No results for input(s): "PROBNP" in the last 8760 hours. HbA1C: No results for input(s): "HGBA1C" in the last 72 hours. CBG: No results for input(s): "GLUCAP" in the last 168 hours. Lipid Profile: No results for input(s): "CHOL", "HDL", "LDLCALC", "TRIG", "CHOLHDL", "LDLDIRECT" in the last 72 hours. Thyroid Function Tests: No results for input(s): "TSH", "T4TOTAL", "FREET4", "T3FREE", "THYROIDAB" in the last 72 hours. Anemia Panel: No results for input(s): "VITAMINB12", "FOLATE", "FERRITIN", "TIBC", "IRON", "RETICCTPCT" in the last 72 hours. Urine analysis:    Component Value Date/Time   COLORURINE BROWN (A) 06/13/2021 0001   APPEARANCEUR CLEAR 06/13/2021 0001   LABSPEC >1.030 (H) 06/13/2021 0001   PHURINE 5.5 06/13/2021 0001   GLUCOSEU NEGATIVE 06/13/2021 0001   HGBUR LARGE (A) 06/13/2021 0001   BILIRUBINUR SMALL (A) 06/13/2021 0001   BILIRUBINUR negative 06/11/2021 0844   KETONESUR NEGATIVE 06/13/2021 0001   PROTEINUR 30 (A) 06/13/2021 0001   UROBILINOGEN 0.2 06/11/2021 0844   UROBILINOGEN 0.2 06/24/2011 0858   NITRITE NEGATIVE 06/13/2021 0001   LEUKOCYTESUR NEGATIVE 06/13/2021 0001    Radiological Exams on Admission: DG Chest 2 View Result Date: 06/04/2023 CLINICAL DATA:  Left-sided pleuritic chest pain EXAM: CHEST - 2 VIEW COMPARISON:  05/18/2023 FINDINGS: Interval improvement in aeration at the right base with persistent left base patchy airspace disease suspicious for pneumonia. Lungs are hyperexpanded. No pleural effusion. The cardiopericardial silhouette is within normal limits for size. Interstitial markings are diffusely coarsened with chronic features. Telemetry leads overlie the chest. IMPRESSION: Interval improvement in aeration at the right base with without resolution of basilar airspace disease seen previously. Persistent left base patchy airspace disease not substantially changed. Electronically  Signed   By: Kennith Center M.D.   On: 06/04/2023 08:35    EKG: Independently reviewed. SR 82bpm.  Assessment/Plan Principal Problem:   Acute hypoxemic respiratory failure (HCC) Active Problems:   Asthma, severe persistent   Allergic eosinophilia   Hyper-IgE syndrome (HCC)   SVT (supraventricular tachycardia) (HCC)   Hypertension, essential   Essential tremor   Thyroid nodule   Acute asthma exacerbation   Lobar pneumonia (HCC)    Mild acute hypoxemic respiratory failure secondary to community-acquired pneumonia -Influenza and COVID testing negative -Obtain respiratory panel -Check urine Legionella  and strep pneumonia -Continue treatment with levofloxacin and has received cefepime in ED  Mild acute asthma exacerbation secondary to above -Xopenex as needed for shortness of breath or wheezing -Pulmicort twice daily -Continue Singulair -Received IV Decadron in the ED, continue daily given Solu-Medrol allergy  Hypertension -Continue amlodipine  Essential tremor -Continue Mysoline  Thyroid nodule -Follow-up outpatient  History of SVT prior ablation -Currently in sinus rhythm   DVT prophylaxis: Lovenox Code Status: Full Family Communication: None at bedside Disposition Plan: Admit for treatment of asthma/pneumonia Consults called:None Admission status: Obs, MedSurg  Severity of Illness: The appropriate patient status for this patient is OBSERVATION. Observation status is judged to be reasonable and necessary in order to provide the required intensity of service to ensure the patient's safety. The patient's presenting symptoms, physical exam findings, and initial radiographic and laboratory data in the context of their medical condition is felt to place them at decreased risk for further clinical deterioration. Furthermore, it is anticipated that the patient will be medically stable for discharge from the hospital within 2 midnights of admission.    Kaliegh Willadsen D Sherryll Burger  DO [facility]  If 7PM-7AM, please contact night-coverage [email or web address]  06/04/2023, 11:44 AM

## 2023-06-04 NOTE — ED Provider Notes (Signed)
[place] EMERGENCY DEPARTMENT AT [facility] Provider Note   CSN: [id number] Arrival date & time: 06/04/23  4098     History  Chief Complaint  Patient presents with   Shortness of Breath    Drew Sandoval is a 48 y.o. male.  48 year old male presenting emergency department for left-sided pleuritic rib pain.  Reports history of asthma has had waxing and waning symptoms for the past month.  He has had cough for the past 2 weeks and has had some left-sided chest pain hurts worse when he moves or breathes deeply for the past several days.  Has some mild shortness of breath currently.   Shortness of Breath      Home Medications Prior to Admission medications   Medication Sig Start Date End Date Taking? Authorizing Provider  acetaminophen (TYLENOL) 325 MG tablet Take 2 tablets (650 mg total) by mouth every 6 (six) hours as needed for mild pain, headache or fever (or Fever >/= 101). 02/05/23   Almon Hercules, MD  acetaminophen (TYLENOL) 500 MG tablet Take 1,000 mg by mouth every 6 (six) hours as needed for moderate pain.    [provider]  albuterol (VENTOLIN HFA) 108 (90 Base) MCG/ACT inhaler Inhale 2 puffs into the lungs every 6 (six) hours as needed for wheezing or shortness of breath. 11/22/22   Leath-Warren, Sadie Haber, NP  amLODipine (NORVASC) 5 MG tablet TAKE 1 TABLET (5 MG TOTAL) BY MOUTH DAILY. Patient taking differently: Take 5 mg by mouth as needed (high blood pressure). 02/12/22   Marinus Maw, MD  benzonatate (TESSALON) 100 MG capsule Take 1 capsule (100 mg total) by mouth every 8 (eight) hours. 05/09/23   Eber Hong, MD  Budeson-Glycopyrrol-Formoterol (BREZTRI AEROSPHERE) 160-9-4.8 MCG/ACT AERO Inhale 2 puffs into the lungs 2 (two) times daily. Rinse mouth 12/24/22   Glenford Bayley, NP  doxycycline (VIBRAMYCIN) 100 MG capsule Take 1 capsule (100 mg total) by mouth 2 (two) times daily. 05/09/23   Eber Hong, MD  Dupilumab (DUPIXENT) 300 MG/2ML  SOAJ Inject 300 mg into the skin every 14 (fourteen) days. **does not need loading dose** 05/30/23   Teretha Chalupa, Clinton D, MD  Ensifentrine [facility]) 3 MG/2.5ML SUSP Inhale 2.5 mLs into the lungs 2 (two) times daily. 03/28/23   Jetty Duhamel D, MD  EPINEPHrine 0.3 mg/0.3 mL IJ SOAJ injection Inject into thigh for severe allergic reaction 09/28/22   Jetty Duhamel D, MD  levalbuterol (XOPENEX) 0.31 MG/3ML nebulizer solution Take 3 mLs (0.31 mg total) by nebulization every 4 (four) hours as needed for wheezing. 09/24/22   Kommor, Madison, MD  levofloxacin (LEVAQUIN) 750 MG tablet Take 1 tablet (750 mg total) by mouth daily. 04/21/23   Catarina Hartshorn, MD  montelukast (SINGULAIR) 10 MG tablet Take 1 tablet (10 mg total) by mouth at bedtime. Patient taking differently: Take 10 mg by mouth daily. 12/24/22   Glenford Bayley, NP  predniSONE (DELTASONE) 20 MG tablet Take 2 tablets (40 mg total) by mouth daily with breakfast. 05/31/23   Particia Nearing, PA-C  primidone (MYSOLINE) 50 MG tablet TAKE 4 TABLETS TWICE A DAY 03/07/23   Tat, Octaviano Batty, DO      Allergies    Azithromycin, Banana, Solu-medrol [methylprednisolone], Alvesco [ciclesonide], Anoro ellipta [umeclidinium-vilanterol], and Penicillins    Review of Systems   Review of Systems  Respiratory:  Positive for shortness of breath.     Physical Exam Updated Vital Signs BP (!) 143/95   Pulse 77  Temp 98.4 F (36.9 C) (Oral)   Resp (!) 24   Ht 6\' 3"  (1.905 m)   Wt 84.8 kg   SpO2 90%   BMI 23.37 kg/m  Physical Exam Vitals and nursing note reviewed.  Constitutional:      General: He is not in acute distress.    Appearance: He is not toxic-appearing.  HENT:     Head: Normocephalic.  Cardiovascular:     Rate and Rhythm: Normal rate and regular rhythm.  Pulmonary:     Effort: Pulmonary effort is normal.     Breath sounds: Normal breath sounds. No wheezing.  Musculoskeletal:     Cervical back: Normal range of motion.     Right lower  leg: No edema.     Left lower leg: No edema.  Skin:    General: Skin is warm.     Capillary Refill: Capillary refill takes less than 2 seconds.  Neurological:     Mental Status: He is alert and oriented to person, place, and time.  Psychiatric:        Mood and Affect: Mood normal.        Behavior: Behavior normal.     ED Results / Procedures / Treatments   Labs (all labs ordered are listed, but only abnormal results are displayed) Labs Reviewed  CBC - Abnormal; Notable for the following components:      Result Value   WBC 16.8 (*)    All other components within normal limits  BASIC METABOLIC PANEL - Abnormal; Notable for the following components:   Calcium 8.7 (*)    All other components within normal limits  RESP PANEL BY RT-PCR (RSV, FLU A&B, COVID)  [id number]  PROCALCITONIN    EKG EKG Interpretation Date/Time:  Saturday June 04 2023 07:24:38 EST Ventricular Rate:  82 PR Interval:  169 QRS Duration:  77 QT Interval:  382 QTC Calculation: 447 R Axis:   88  Text Interpretation: Sinus rhythm Right atrial enlargement ST elev, probable normal early repol pattern Confirmed by Estanislado Pandy [phone]) on 06/04/2023 9:35:46 AM  Radiology DG Chest 2 View Result Date: 06/04/2023 CLINICAL DATA:  Left-sided pleuritic chest pain EXAM: CHEST - 2 VIEW COMPARISON:  05/18/2023 FINDINGS: Interval improvement in aeration at the right base with persistent left base patchy airspace disease suspicious for pneumonia. Lungs are hyperexpanded. No pleural effusion. The cardiopericardial silhouette is within normal limits for size. Interstitial markings are diffusely coarsened with chronic features. Telemetry leads overlie the chest. IMPRESSION: Interval improvement in aeration at the right base with without resolution of basilar airspace disease seen previously. Persistent left base patchy airspace disease not substantially changed. Electronically Signed   By: Kennith Center M.D.   On: 06/04/2023 08:35     Procedures Procedures    Medications Ordered in ED Medications  lidocaine (LIDODERM) 5 % 1 patch (1 patch Transdermal Patch Applied 06/04/23 0802)  ceFEPIme (MAXIPIME) 2 g in sodium chloride 0.9 % 100 mL IVPB (2 g Intravenous New Bag/Given 06/04/23 0950)  ipratropium-albuterol (DUONEB) 0.5-2.5 (3) MG/3ML nebulizer solution 3 mL (3 mLs Nebulization Given 06/04/23 0743)  albuterol (PROVENTIL) (2.5 MG/3ML) 0.083% nebulizer solution 5 mg (5 mg Nebulization Given 06/04/23 0943)  ipratropium (ATROVENT) nebulizer solution 0.5 mg (0.5 mg Nebulization Given 06/04/23 0944)  dexamethasone (DECADRON) injection 10 mg (10 mg Intravenous Given 06/04/23 0946)    ED Course/ Medical Decision Making/ A&P Clinical Course as of 06/04/23 0953  Sat Jun 04, 2023  0902 DG Chest 2 View [  TY]  [id number] Reevaluated patient.  Oxygen saturation upper 80s on room air.  Does not appear to be in overt respiratory distress, does have a reactive type cough.  Will order steroid and second breathing treatment.  Chest x-ray with pneumonia.  Per chart review has been on several antibiotics in the past 30 days however has persistent symptoms.  Antibiotics ordered here will plan for admission for pneumonia and asthma exacerbation. [TY]    Clinical Course User Index [TY] Coral Spikes, DO                                 Medical Decision Making Is a 48 year old male with history of asthma presenting to the emergency department complaining of shortness of breath and left pleuritic type chest pain.  He is afebrile nontachycardic hemodynamically stable.  On exam does not appear to be in overt respiratory distress, maintaining oxygen saturation on room air.  Lungs largely clear.  His chest wall pain is reproducible palpation which would suggest likely MSK type etiology.  However, given chronicity of symptoms will get chest x-ray to evaluate for pneumonia, pneumothorax.  Will give DuoNeb for shortness of breath. Lidocaine patch for pain.  See Ed course for further MDM/disposition.  Amount and/or Complexity of Data Reviewed External Data Reviewed:     Details: Patient has had 5 visits since December for asthma exacerbation Labs: ordered.    Details: No fever tachycardia or leukocytosis.  Symptoms seemingly MSK in etiology.  Low suspicion of infection or acute metabolic derangement.  Will forego labs at this time Radiology: ordered. Decision-making details documented in ED Course. ECG/medicine tests:  Decision-making details documented in ED Course.  Risk Prescription drug management. Decision regarding hospitalization.          Final Clinical Impression(s) / ED Diagnoses Final diagnoses:  Pneumonia of left lower lobe due to infectious organism    Rx / DC Orders ED Discharge Orders     None         Coral Spikes, DO 06/04/23 [phone]

## 2023-06-04 NOTE — ED Notes (Signed)
Pt O2 sats dropped to 83% on room air MD in room, placed pt on 2L Hansen sats up to 90%

## 2023-06-05 DIAGNOSIS — D824 Hyperimmunoglobulin E [IgE] syndrome: Secondary | ICD-10-CM | POA: Diagnosis present

## 2023-06-05 DIAGNOSIS — Z888 Allergy status to other drugs, medicaments and biological substances status: Secondary | ICD-10-CM | POA: Diagnosis not present

## 2023-06-05 DIAGNOSIS — Z79899 Other long term (current) drug therapy: Secondary | ICD-10-CM | POA: Diagnosis not present

## 2023-06-05 DIAGNOSIS — J4551 Severe persistent asthma with (acute) exacerbation: Secondary | ICD-10-CM | POA: Diagnosis present

## 2023-06-05 DIAGNOSIS — Z91018 Allergy to other foods: Secondary | ICD-10-CM | POA: Diagnosis not present

## 2023-06-05 DIAGNOSIS — Z801 Family history of malignant neoplasm of trachea, bronchus and lung: Secondary | ICD-10-CM | POA: Diagnosis not present

## 2023-06-05 DIAGNOSIS — E041 Nontoxic single thyroid nodule: Secondary | ICD-10-CM | POA: Diagnosis present

## 2023-06-05 DIAGNOSIS — J181 Lobar pneumonia, unspecified organism: Secondary | ICD-10-CM | POA: Diagnosis present

## 2023-06-05 DIAGNOSIS — J9601 Acute respiratory failure with hypoxia: Secondary | ICD-10-CM | POA: Diagnosis not present

## 2023-06-05 DIAGNOSIS — G250 Essential tremor: Secondary | ICD-10-CM | POA: Diagnosis present

## 2023-06-05 DIAGNOSIS — Z825 Family history of asthma and other chronic lower respiratory diseases: Secondary | ICD-10-CM | POA: Diagnosis not present

## 2023-06-05 DIAGNOSIS — Z1152 Encounter for screening for COVID-19: Secondary | ICD-10-CM | POA: Diagnosis not present

## 2023-06-05 DIAGNOSIS — Z7952 Long term (current) use of systemic steroids: Secondary | ICD-10-CM | POA: Diagnosis not present

## 2023-06-05 DIAGNOSIS — I10 Essential (primary) hypertension: Secondary | ICD-10-CM | POA: Diagnosis present

## 2023-06-05 DIAGNOSIS — Z880 Allergy status to penicillin: Secondary | ICD-10-CM | POA: Diagnosis not present

## 2023-06-05 DIAGNOSIS — Z833 Family history of diabetes mellitus: Secondary | ICD-10-CM | POA: Diagnosis not present

## 2023-06-05 DIAGNOSIS — Z8616 Personal history of COVID-19: Secondary | ICD-10-CM | POA: Diagnosis not present

## 2023-06-05 DIAGNOSIS — Z8679 Personal history of other diseases of the circulatory system: Secondary | ICD-10-CM | POA: Diagnosis not present

## 2023-06-05 DIAGNOSIS — Z87891 Personal history of nicotine dependence: Secondary | ICD-10-CM | POA: Diagnosis not present

## 2023-06-05 DIAGNOSIS — Z8261 Family history of arthritis: Secondary | ICD-10-CM | POA: Diagnosis not present

## 2023-06-05 DIAGNOSIS — Z7951 Long term (current) use of inhaled steroids: Secondary | ICD-10-CM | POA: Diagnosis not present

## 2023-06-05 DIAGNOSIS — Z881 Allergy status to other antibiotic agents status: Secondary | ICD-10-CM | POA: Diagnosis not present

## 2023-06-05 LAB — CBC
HCT: 38.5 % — ABNORMAL LOW (ref 39.0–52.0)
Hemoglobin: 12.8 g/dL — ABNORMAL LOW (ref 13.0–17.0)
MCH: 30.8 pg (ref 26.0–34.0)
MCHC: 33.2 g/dL (ref 30.0–36.0)
MCV: 92.8 fL (ref 80.0–100.0)
Platelets: 242 10*3/uL (ref 150–400)
RBC: 4.15 MIL/uL — ABNORMAL LOW (ref 4.22–5.81)
RDW: 15 % (ref 11.5–15.5)
WBC: 16.2 10*3/uL — ABNORMAL HIGH (ref 4.0–10.5)
nRBC: 0 % (ref 0.0–0.2)

## 2023-06-05 LAB — BASIC METABOLIC PANEL
Anion gap: 6 (ref 5–15)
BUN: 19 mg/dL (ref 6–20)
CO2: 25 mmol/L (ref 22–32)
Calcium: 8.4 mg/dL — ABNORMAL LOW (ref 8.9–10.3)
Chloride: 107 mmol/L (ref 98–111)
Creatinine, Ser: 0.83 mg/dL (ref 0.61–1.24)
GFR, Estimated: >60 mL/min (ref >60)
Glucose, Bld: 75 mg/dL (ref 70–99)
Potassium: 3.7 mmol/L (ref 3.5–5.1)
Sodium: 138 mmol/L (ref 135–145)

## 2023-06-05 LAB — MAGNESIUM: Magnesium: 1.8 mg/dL (ref 1.7–2.4)

## 2023-06-05 MED ORDER — LEVALBUTEROL HCL 0.63 MG/3ML IN NEBU
0.6300 mg | INHALATION_SOLUTION | Freq: Four times a day (QID) | RESPIRATORY_TRACT | Status: DC
  Administered 2023-06-05 – 2023-06-06 (×4): 0.63 mg via RESPIRATORY_TRACT
  Filled 2023-06-05 (×4): qty 3

## 2023-06-05 MED ORDER — SALINE SPRAY 0.65 % NA SOLN
1.0000 | NASAL | Status: DC | PRN
  Administered 2023-06-05: 1 via NASAL
  Filled 2023-06-05: qty 44

## 2023-06-05 NOTE — Progress Notes (Signed)
PROGRESS NOTE    Drew Sandoval  ZOX:[id number] DOB: 06-May-1976 DOA: 06/04/2023 PCP: Pcp, No   Brief Narrative:    Drew Sandoval is a 48 y.o. male with medical history significant for severe persistent asthma, hyper IgE syndrome, SVT status post ablation, hypertension, and essential tremor who has apparently been struggling with waxing and waning symptoms of shortness of breath along with cough over the last 1.5 months.  He states he has had worsening shortness of breath as well as left-sided chest pain that has worsened over the last several days.  He has been admitted for acute hypoxemic respiratory failure secondary to suspected community-acquired pneumonia with associated asthma exacerbation.  Assessment & Plan:   Principal Problem:   Acute hypoxemic respiratory failure (HCC) Active Problems:   Asthma, severe persistent   Allergic eosinophilia   Hyper-IgE syndrome (HCC)   SVT (supraventricular tachycardia) (HCC)   Hypertension, essential   Essential tremor   Thyroid nodule   Acute asthma exacerbation   Lobar pneumonia (HCC)  Assessment and Plan:   Mild acute hypoxemic respiratory failure secondary to community-acquired pneumonia -Influenza and COVID testing negative -Respiratory panel negative -Check urine Legionella pending and strep pneumonia negative -Continue treatment with levofloxacin and has received cefepime in ED -Encouraged ambulation and incentive spirometry.   Mild acute asthma exacerbation secondary to above -Xopenex scheduled to every 6 hours -Pulmicort twice daily -Continue Singulair -Received IV Decadron in the ED, continue daily given Solu-Medrol allergy   Hypertension -Continue amlodipine   Essential tremor -Continue Mysoline   Thyroid nodule -Follow-up outpatient   History of SVT prior ablation -Currently in sinus rhythm -Continue on Xopenex as above   DVT prophylaxis:Lovenox Code Status: Full Family Communication: None at  bedside Disposition Plan:  Status is: Observation The patient will require care spanning > 2 midnights and should be moved to inpatient because: Need for IV medications.   Consultants:  None  Procedures:  None  Antimicrobials:  Anti-infectives (From admission, onward)    Start     Dose/Rate Route Frequency Ordered Stop   06/05/23 1800  levofloxacin (LEVAQUIN) IVPB 750 mg        750 mg 100 mL/hr over 90 Minutes Intravenous Every 24 hours 06/04/23 1150     06/04/23 0945  ceFEPIme (MAXIPIME) 2 g in sodium chloride 0.9 % 100 mL IVPB        2 g 200 mL/hr over 30 Minutes Intravenous  Once 06/04/23 0939 06/04/23 1020   06/04/23 0930  levofloxacin (LEVAQUIN) IVPB 750 mg  Status:  Discontinued        750 mg 100 mL/hr over 90 Minutes Intravenous  Once 06/04/23 8119 06/04/23 0936       Subjective: Patient seen and evaluated today with ongoing wheezing and cough with no production.  He continues to remain on 4 L nasal cannula oxygen.  Objective: Vitals:   06/04/23 2002 06/04/23 2336 06/05/23 0452 06/05/23 0755  BP: 118/61 (!) 148/79 (!) 143/78   Pulse: 81 65 65   Resp: 20 20 20    Temp: 98.5 F (36.9 C) 97.8 F (36.6 C) 98.6 F (37 C)   TempSrc: Oral Oral Oral   SpO2: 92% 98% 92% 95%  Weight:      Height:        Intake/Output Summary (Last 24 hours) at 06/05/2023 0929 Last data filed at 06/05/2023 0454 Gross per 24 hour  Intake 580 ml  Output --  Net 580 ml   Filed Weights   06/04/23 0727  Weight: 84.8 kg    Examination:  General exam: Appears calm and comfortable  Respiratory system: Diminished to auscultation bilaterally.  4 L nasal cannula. Cardiovascular system: S1 & S2 heard, RRR.  Gastrointestinal system: Abdomen is soft Central nervous system: Alert and awake Extremities: No edema Skin: No significant lesions noted Psychiatry: Flat affect.    Data Reviewed: I have personally reviewed following labs and imaging studies  CBC: Recent Labs  Lab  06/04/23 0923 06/05/23 0425  WBC 16.8* 16.2*  HGB 13.4 12.8*  HCT 41.5 38.5*  MCV 94.5 92.8  PLT 254 242   Basic Metabolic Panel: Recent Labs  Lab 06/04/23 0923 06/05/23 0425  NA 140 138  K 3.6 3.7  CL 105 107  CO2 25 25  GLUCOSE 93 75  BUN 16 19  CREATININE 1.12 0.83  CALCIUM 8.7* 8.4*  MG  --  1.8   GFR: Estimated Creatinine Clearance: 131.5 mL/min (by C-G formula based on SCr of 0.83 mg/dL). Liver Function Tests: No results for input(s): "AST", "ALT", "ALKPHOS", "BILITOT", "PROT", "ALBUMIN" in the last 168 hours. No results for input(s): "LIPASE", "AMYLASE" in the last 168 hours. No results for input(s): "AMMONIA" in the last 168 hours. Coagulation Profile: No results for input(s): "INR", "PROTIME" in the last 168 hours. Cardiac Enzymes: No results for input(s): "CKTOTAL", "CKMB", "CKMBINDEX", "TROPONINI" in the last 168 hours. BNP (last 3 results) No results for input(s): "PROBNP" in the last 8760 hours. HbA1C: No results for input(s): "HGBA1C" in the last 72 hours. CBG: No results for input(s): "GLUCAP" in the last 168 hours. Lipid Profile: No results for input(s): "CHOL", "HDL", "LDLCALC", "TRIG", "CHOLHDL", "LDLDIRECT" in the last 72 hours. Thyroid Function Tests: No results for input(s): "TSH", "T4TOTAL", "FREET4", "T3FREE", "THYROIDAB" in the last 72 hours. Anemia Panel: No results for input(s): "VITAMINB12", "FOLATE", "FERRITIN", "TIBC", "IRON", "RETICCTPCT" in the last 72 hours. Sepsis Labs: Recent Labs  Lab 06/04/23 1610  PROCALCITON <0.10    Recent Results (from the past 240 hours)  Resp panel by RT-PCR (RSV, Flu A&B, Covid) Anterior Nasal Swab     Status: None   Collection Time: 06/04/23  9:55 AM   Specimen: Anterior Nasal Swab  Result Value Ref Range Status   SARS Coronavirus 2 by RT PCR NEGATIVE NEGATIVE Final    Comment: (NOTE) SARS-CoV-2 target nucleic acids are NOT DETECTED.  The SARS-CoV-2 RNA is generally detectable in upper  respiratory specimens during the acute phase of infection. The lowest concentration of SARS-CoV-2 viral copies this assay can detect is 138 copies/mL. A negative result does not preclude SARS-Cov-2 infection and should not be used as the sole basis for treatment or other patient management decisions. A negative result may occur with  improper specimen collection/handling, submission of specimen other than nasopharyngeal swab, presence of viral mutation(s) within the areas targeted by this assay, and inadequate number of viral copies(<138 copies/mL). A negative result must be combined with clinical observations, patient history, and epidemiological information. The expected result is Negative.  Fact Sheet for Patients:  BloggerCourse.com  Fact Sheet for Healthcare Providers:  SeriousBroker.it  This test is no t yet approved or cleared by the [place] FDA and  has been authorized for detection and/or diagnosis of SARS-CoV-2 by FDA under an Emergency Use Authorization (EUA). This EUA will remain  in effect (meaning this test can be used) for the duration of the COVID-19 declaration under Section 564(b)(1) of the Act, 21 U.S.C.section 360bbb-3(b)(1), unless the authorization is terminated  or  revoked sooner.       Influenza A by PCR NEGATIVE NEGATIVE Final   Influenza B by PCR NEGATIVE NEGATIVE Final    Comment: (NOTE) The Xpert Xpress SARS-CoV-2/FLU/RSV plus assay is intended as an aid in the diagnosis of influenza from Nasopharyngeal swab specimens and should not be used as a sole basis for treatment. Nasal washings and aspirates are unacceptable for Xpert Xpress SARS-CoV-2/FLU/RSV testing.  Fact Sheet for Patients: BloggerCourse.com  Fact Sheet for Healthcare Providers: SeriousBroker.it  This test is not yet approved or cleared by the [place] FDA and has been  authorized for detection and/or diagnosis of SARS-CoV-2 by FDA under an Emergency Use Authorization (EUA). This EUA will remain in effect (meaning this test can be used) for the duration of the COVID-19 declaration under Section 564(b)(1) of the Act, 21 U.S.C. section 360bbb-3(b)(1), unless the authorization is terminated or revoked.     Resp Syncytial Virus by PCR NEGATIVE NEGATIVE Final    Comment: (NOTE) Fact Sheet for Patients: BloggerCourse.com  Fact Sheet for Healthcare Providers: SeriousBroker.it  This test is not yet approved or cleared by the [place] FDA and has been authorized for detection and/or diagnosis of SARS-CoV-2 by FDA under an Emergency Use Authorization (EUA). This EUA will remain in effect (meaning this test can be used) for the duration of the COVID-19 declaration under Section 564(b)(1) of the Act, 21 U.S.C. section 360bbb-3(b)(1), unless the authorization is terminated or revoked.  Performed at [facility], [address], [place], [place] [address]   Respiratory (~20 pathogens) panel by PCR     Status: None   Collection Time: 06/04/23 11:51 AM   Specimen: Nasopharyngeal Swab; Respiratory  Result Value Ref Range Status   Adenovirus NOT DETECTED NOT DETECTED Final   Coronavirus 229E NOT DETECTED NOT DETECTED Final    Comment: (NOTE) The Coronavirus on the Respiratory Panel, DOES NOT test for the novel  Coronavirus (2019 nCoV)    Coronavirus HKU1 NOT DETECTED NOT DETECTED Final   Coronavirus NL63 NOT DETECTED NOT DETECTED Final   Coronavirus OC43 NOT DETECTED NOT DETECTED Final   Metapneumovirus NOT DETECTED NOT DETECTED Final   Rhinovirus / Enterovirus NOT DETECTED NOT DETECTED Final   Influenza A NOT DETECTED NOT DETECTED Final   Influenza B NOT DETECTED NOT DETECTED Final   Parainfluenza Virus 1 NOT DETECTED NOT DETECTED Final   Parainfluenza Virus 2 NOT DETECTED NOT DETECTED Final    Parainfluenza Virus 3 NOT DETECTED NOT DETECTED Final   Parainfluenza Virus 4 NOT DETECTED NOT DETECTED Final   Respiratory Syncytial Virus NOT DETECTED NOT DETECTED Final   Bordetella pertussis NOT DETECTED NOT DETECTED Final   Bordetella Parapertussis NOT DETECTED NOT DETECTED Final   Chlamydophila pneumoniae NOT DETECTED NOT DETECTED Final   Mycoplasma pneumoniae NOT DETECTED NOT DETECTED Final    Comment: Performed at [facility], [address], [place], [place] [address]         Radiology Studies: DG Chest 2 View Result Date: 06/04/2023 CLINICAL DATA:  Left-sided pleuritic chest pain EXAM: CHEST - 2 VIEW COMPARISON:  05/18/2023 FINDINGS: Interval improvement in aeration at the right base with persistent left base patchy airspace disease suspicious for pneumonia. Lungs are hyperexpanded. No pleural effusion. The cardiopericardial silhouette is within normal limits for size. Interstitial markings are diffusely coarsened with chronic features. Telemetry leads overlie the chest. IMPRESSION: Interval improvement in aeration at the right base with without resolution of basilar airspace disease seen previously. Persistent left  base patchy airspace disease not substantially changed. Electronically Signed   By: Kennith Center M.D.   On: 06/04/2023 08:35        Scheduled Meds:  amLODipine  5 mg Oral Daily   budesonide (PULMICORT) nebulizer solution  0.25 mg Nebulization BID   dexamethasone  6 mg Oral Daily   dextromethorphan-guaiFENesin  1 tablet Oral BID   enoxaparin (LOVENOX) injection  40 mg Subcutaneous Q24H   levalbuterol  0.63 mg Nebulization Q6H   lidocaine  1 patch Transdermal Q24H   montelukast  10 mg Oral QHS   pantoprazole  40 mg Oral Daily   primidone  200 mg Oral BID   Continuous Infusions:  levofloxacin (LEVAQUIN) IV       LOS: 0 days    Time spent: 35 minutes    Gioia Ranes Hoover Brunette, DO [facility]  If 7PM-7AM, please contact  night-coverage [email or web address] 06/05/2023, 9:29 AM

## 2023-06-05 NOTE — Progress Notes (Signed)
Pt resting quietly in bed. Is not eating his hospital provided lunch as he had his family member bring him food from outside restaurant earlier this am. Pt is upset that MD has him on cardiac/low salt diet. Advised pt regarding importance of sodium restriction because of his hx of hypertension and current elevated blood pressures. Pt states he's had high blood pressure for a long time and he's never had any problem with salt. Provided education on how salt affects body and blood pressure. Pt just shakes his head and made blowing noise. Pt denies any complaints of pain at this time. No resp difficulty noted, resp even and nonlabored. Pt states that he really feels better since he's been using the incentive spirometer today. Only occasional cough noted, remain non-productive. Advised pt to call for needs.

## 2023-06-05 NOTE — Progress Notes (Signed)
SATURATION QUALIFICATIONS: (This note is used to comply with regulatory documentation for home oxygen)  Patient Saturations on Room Air at Rest = 90%  Patient Saturations on Room Air while Ambulating = 87%  Patient Saturations on 2 Liters of oxygen while Ambulating = 95%  Pt ambulated total of 500 ft in hallway on room air, did have increased dyspnea with exertion, recovered with rest.   Pt states his breathing and left sided chest pain both feel better since he has been doing the Incentive spirometer this am.

## 2023-06-05 NOTE — Progress Notes (Signed)
In to admin IV abx. Noted that pt had taken his O2 off and was eating food that his family brought in for him. Asked pt if he had any pain and he stated no (has not had any pain medication since this am). Pt denies any SOB, occasional cough - nonproductive. SaO2 checked, reads 87% on room air. Asked pt to reapply O2 @ 2lpm [place], SaO2 up to 90%. Pt states his nose is dry and he had a little nose bleed. O2 is already humidified, so pt given nasal saline spray for moisture.

## 2023-06-05 NOTE — Progress Notes (Signed)
   06/05/23 1103  TOC Brief Assessment  Insurance and Status Reviewed  Patient has primary care physician No (No PCP listed)  Home environment has been reviewed From home  Prior level of function: Independent  Prior/Current Home Services No current home services  Social Drivers of Health Review SDOH reviewed no interventions necessary  Readmission risk has been reviewed Yes  Transition of care needs no transition of care needs at this time      Transition of Care Department [facility]) has reviewed patient and no TOC needs have been identified at this time. We will continue to monitor patient advancement through interdisciplinary progression rounds. If new patient transition needs arise, please place a TOC consult.

## 2023-06-05 NOTE — Plan of Care (Signed)

## 2023-06-06 DIAGNOSIS — J9601 Acute respiratory failure with hypoxia: Secondary | ICD-10-CM | POA: Diagnosis not present

## 2023-06-06 MED ORDER — ALBUTEROL SULFATE HFA 108 (90 BASE) MCG/ACT IN AERS: 2.0000 | INHALATION_SPRAY | Freq: Four times a day (QID) | RESPIRATORY_TRACT | 2 refills | Status: DC | PRN

## 2023-06-06 MED ORDER — LIDOCAINE 5 % EX PTCH: 1.0000 | MEDICATED_PATCH | CUTANEOUS | 0 refills | Status: DC

## 2023-06-06 MED ORDER — LEVOFLOXACIN 750 MG PO TABS: 750.0000 mg | ORAL_TABLET | Freq: Every day | ORAL | 0 refills | Status: AC

## 2023-06-06 MED ORDER — DEXAMETHASONE 6 MG PO TABS: 6.0000 mg | ORAL_TABLET | Freq: Every day | ORAL | 0 refills | Status: AC

## 2023-06-06 NOTE — TOC Progression Note (Signed)
Transition of Care [facility]) - Progression Note    Patient Details  Name: Drew Sandoval MRN: [id number] Date of Birth: 09/13/1975  [facility]) CM/SW Contact  Elliot Gault, LCSW Phone Number: 06/06/2023, 9:52 AM  Clinical Narrative:     PCP list added to pt's AVS    Barriers to Discharge: Continued Medical Work up  Expected Discharge Plan and Services         Expected Discharge Date: 06/06/23                                     Social Determinants of Health (SDOH) Interventions SDOH Screenings   Food Insecurity: No Food Insecurity (06/04/2023)  Housing: Low Risk  (06/04/2023)  Transportation Needs: No Transportation Needs (06/04/2023)  Utilities: Not At Risk (06/04/2023)  Depression (PHQ2-9): Low Risk  (06/11/2021)  Tobacco Use: Medium Risk (06/04/2023)    Readmission Risk Interventions     No data to display

## 2023-06-06 NOTE — Plan of Care (Signed)
  Problem: Clinical Measurements: Goal: Respiratory complications will improve Outcome: Adequate for Discharge Goal: Cardiovascular complication will be avoided Outcome: Adequate for Discharge   Problem: Pain Managment: Goal: General experience of comfort will improve and/or be controlled Outcome: Adequate for Discharge

## 2023-06-06 NOTE — Discharge Instructions (Signed)
Providers Accepting New Patients in [place], [place]    [facility] [address]  [place], [place] [address] [phone] Accepts most insurances  [facility] [address] [place], [place] [address] [phone] Accepts most insurances  [facility] [address] [place], [place] [address]  [phone] Must meet requirements  [facility] [address] [place], [place] [address] [phone] Accepts most insurances  [facility] [address]  [place], [place] [address] [phone] Accepts most insurances  [facility] [address]   [place], [place]   [phone] Accepts most insurances  [facility])  [phone] [address]  [place], [place] [address] [phone] Accepts most insurances     [facility] [address]  [place], [place] [address] [phone] Accepts most insurances  [facility] [address] [place], [place] [address] [phone] option 1 Accepts Medicaid and [facility] [address]  [place], [place] [address] [phone] Accepts most insurances  Avon Gully, MD [address] [place], [place] [address] [phone] Accepts most insurances  [facility] [address]  [place], [place] [address] [phone] Accepts most insurances  [facility] [phone] [address] [place], [place] [address] [phone] Accepts most insurances  [place] [place] [address] [place], [place] [address] [phone]  Accepts most insurances

## 2023-06-06 NOTE — Plan of Care (Signed)
  Problem: Clinical Measurements: Goal: Respiratory complications will improve Outcome: Progressing Goal: Cardiovascular complication will be avoided Outcome: Progressing   Problem: Pain Managment: Goal: General experience of comfort will improve and/or be controlled Outcome: Progressing

## 2023-06-06 NOTE — Discharge Summary (Signed)
Physician Discharge Summary  Drew Sandoval UJW:[id number] DOB: Jun 21, 1975 DOA: 06/04/2023  PCP: Pcp, No  Admit date: 06/04/2023  Discharge date: 06/06/2023  Admitted From:Home  Disposition:  Home  Recommendations for Outpatient Follow-up:  Follow up with pulmonology, Dr. Sherene Sires as scheduled in February Continue on Decadron for 5 days as prescribed Finish course of Levaquin with 4 more days as prescribed Refills given on albuterol inhaler to use for shortness of breath or wheezing Lidocaine patch given for left-sided chest pain Continue other home medications as prior Patient remains high risk for readmission  Home Health: None  Equipment/Devices: None  Discharge Condition:Stable  CODE STATUS: Full  Diet recommendation: Heart Healthy  Brief/Interim Summary: Drew Sandoval is a 48 y.o. male with medical history significant for severe persistent asthma, hyper IgE syndrome, SVT status post ablation, hypertension, and essential tremor who has apparently been struggling with waxing and waning symptoms of shortness of breath along with cough over the last 1.5 months.  He states he has had worsening shortness of breath as well as left-sided chest pain that has worsened over the last several days.  He has been admitted for acute hypoxemic respiratory failure secondary to suspected community-acquired pneumonia with associated asthma exacerbation.  He is overall feeling much better today and is eager for discharge.  He no longer has any oxygen requirements.  He will follow-up with pulmonology outpatient.    Discharge Diagnoses:  Principal Problem:   Acute hypoxemic respiratory failure (HCC) Active Problems:   Asthma, severe persistent   Allergic eosinophilia   Hyper-IgE syndrome (HCC)   SVT (supraventricular tachycardia) (HCC)   Hypertension, essential   Essential tremor   Thyroid nodule   Acute asthma exacerbation   Lobar pneumonia (HCC)  Principal discharge diagnosis: Acute  hypoxemic respiratory failure secondary to community-acquired pneumonia and acute asthma exacerbation.  Discharge Instructions  Discharge Instructions     Diet - low sodium heart healthy   Complete by: As directed    Increase activity slowly   Complete by: As directed       Allergies as of 06/06/2023       Reactions   Azithromycin Rash   Banana Swelling   Throat swelling   Solu-medrol [methylprednisolone] Other (See Comments)   States his whole body itches   Alvesco [ciclesonide] Rash   Possible reaction- rash   Anoro Ellipta [umeclidinium-vilanterol] Rash   Possible- rash   Penicillins Rash        Medication List     STOP taking these medications    predniSONE 20 MG tablet Commonly known as: DELTASONE       TAKE these medications    acetaminophen 500 MG tablet Commonly known as: TYLENOL Take 1,000 mg by mouth every 6 (six) hours as needed for moderate pain.   albuterol 108 (90 Base) MCG/ACT inhaler Commonly known as: VENTOLIN HFA Inhale 2 puffs into the lungs every 6 (six) hours as needed for wheezing or shortness of breath.   amLODipine 5 MG tablet Commonly known as: NORVASC TAKE 1 TABLET (5 MG TOTAL) BY MOUTH DAILY. What changed:  when to take this reasons to take this   Breztri Aerosphere 160-9-4.8 MCG/ACT Aero Generic drug: Budeson-Glycopyrrol-Formoterol Inhale 2 puffs into the lungs 2 (two) times daily. Rinse mouth   dexamethasone 6 MG tablet Commonly known as: DECADRON Take 1 tablet (6 mg total) by mouth daily for 5 days. Start taking on: June 07, 2023   EPINEPHrine 0.3 mg/0.3 mL Soaj injection Commonly known as: EPI-PEN Inject  into thigh for severe allergic reaction   levalbuterol 0.31 MG/3ML nebulizer solution Commonly known as: XOPENEX Take 3 mLs (0.31 mg total) by nebulization every 4 (four) hours as needed for wheezing.   levofloxacin 750 MG tablet Commonly known as: Levaquin Take 1 tablet (750 mg total) by mouth daily for 4  days.   lidocaine 5 % Commonly known as: LIDODERM Place 1 patch onto the skin daily. Remove & Discard patch within 12 hours or as directed by MD Start taking on: June 07, 2023   montelukast 10 MG tablet Commonly known as: Singulair Take 1 tablet (10 mg total) by mouth at bedtime. What changed: when to take this   Ohtuvayre 3 MG/2.5ML Susp Generic drug: Ensifentrine Inhale 2.5 mLs into the lungs 2 (two) times daily.   primidone 50 MG tablet Commonly known as: MYSOLINE TAKE 4 TABLETS TWICE A DAY        Follow-up Information     Nyoka Cowden, MD. Go to.   Specialty: Pulmonary Disease Contact information: [address] [place] [address] [phone]                Allergies  Allergen Reactions   Azithromycin Rash   Banana Swelling    Throat swelling    Solu-Medrol [Methylprednisolone] Other (See Comments)    States his whole body itches   Alvesco [Ciclesonide] Rash    Possible reaction- rash   Anoro Ellipta [Umeclidinium-Vilanterol] Rash    Possible- rash   Penicillins Rash    Consultations: None   Procedures/Studies: DG Chest 2 View Result Date: 06/04/2023 CLINICAL DATA:  Left-sided pleuritic chest pain EXAM: CHEST - 2 VIEW COMPARISON:  05/18/2023 FINDINGS: Interval improvement in aeration at the right base with persistent left base patchy airspace disease suspicious for pneumonia. Lungs are hyperexpanded. No pleural effusion. The cardiopericardial silhouette is within normal limits for size. Interstitial markings are diffusely coarsened with chronic features. Telemetry leads overlie the chest. IMPRESSION: Interval improvement in aeration at the right base with without resolution of basilar airspace disease seen previously. Persistent left base patchy airspace disease not substantially changed. Electronically Signed   By: Kennith Center M.D.   On: 06/04/2023 08:35   DG Chest Port 1 View Result Date: 05/18/2023 CLINICAL DATA:  48 year old male with  shortness of breath. Diagnosed with pneumonia in December. EXAM: PORTABLE CHEST 1 VIEW COMPARISON:  Chest radiographs 05/09/2023 and earlier. Chest CT 04/20/2023. FINDINGS: Portable AP upright view at 0604 hours. Stable lung volumes and mediastinal contours with ongoing coarse upper lung and patchy, confluent lower lung opacity which does not appear significantly changed or improved from 04/20/2023. No pleural effusion. No pneumothorax. Normal cardiac size and mediastinal contours. Visualized tracheal air column is within normal limits. No acute osseous abnormality identified. IMPRESSION: No improvement in bilateral basilar predominant abnormal lung opacity which seems unchanged since the Chest CT on 04/20/2023. Consider a non-infectious pulmonary inflammation or interstitial disease might be more likely than unresolved lung infection. Pulmonology consultation, bronchoscopy may be valuable. Electronically Signed   By: Odessa Fleming M.D.   On: 05/18/2023 07:30   DG Chest 2 View Result Date: 05/09/2023 CLINICAL DATA:  48 year old male with cough, shortness of breath, no improvement with inhalers. EXAM: CHEST - 2 VIEW COMPARISON:  Chest CT 04/20/2023 and earlier. FINDINGS: Coarse bilateral pulmonary interstitial opacity which is only mildly more confluent in the lung bases does not appear significantly changed from the chest CT earlier this month, and only some of the opacity  is new compared to September radiographs. Underlying large lung volumes, hyperinflation suspected. No pneumothorax, pleural effusion. No definite consolidation at this time. Mediastinal contours remain normal. Visualized tracheal air column is within normal limits. No acute osseous abnormality identified. Negative visible bowel gas. IMPRESSION: Widespread coarse bilateral pulmonary interstitial opacity, unchanged 2 degrees since September this year, stable to mildly progressed from CT earlier this month. Favor unresolved bilateral pneumonia  superimposed on chronic lung disease. However, a noninfectious pulmonary inflammation (such as vasculitis, hypersensitivity) might appear similar. No consolidation or pleural effusion. Electronically Signed   By: Odessa Fleming M.D.   On: 05/09/2023 11:08     Discharge Exam: Vitals:   06/06/23 0843 06/06/23 0844  BP:    Pulse:    Resp:    Temp:    SpO2: 96% 98%   Vitals:   06/05/23 2015 06/06/23 0414 06/06/23 0843 06/06/23 0844  BP:  (!) 139/98    Pulse:  64    Resp:  18    Temp:  98.1 F (36.7 C)    TempSrc:  Oral    SpO2: 91% 91% 96% 98%  Weight:      Height:        General: Pt is alert, awake, not in acute distress Cardiovascular: RRR, S1/S2 +, no rubs, no gallops Respiratory: CTA bilaterally, no wheezing, no rhonchi Abdominal: Soft, NT, ND, bowel sounds + Extremities: no edema, no cyanosis    The results of significant diagnostics from this hospitalization (including imaging, microbiology, ancillary and laboratory) are listed below for reference.     Microbiology: Recent Results (from the past 240 hours)  Resp panel by RT-PCR (RSV, Flu A&B, Covid) Anterior Nasal Swab     Status: None   Collection Time: 06/04/23  9:55 AM   Specimen: Anterior Nasal Swab  Result Value Ref Range Status   SARS Coronavirus 2 by RT PCR NEGATIVE NEGATIVE Final    Comment: (NOTE) SARS-CoV-2 target nucleic acids are NOT DETECTED.  The SARS-CoV-2 RNA is generally detectable in upper respiratory specimens during the acute phase of infection. The lowest concentration of SARS-CoV-2 viral copies this assay can detect is 138 copies/mL. A negative result does not preclude SARS-Cov-2 infection and should not be used as the sole basis for treatment or other patient management decisions. A negative result may occur with  improper specimen collection/handling, submission of specimen other than nasopharyngeal swab, presence of viral mutation(s) within the areas targeted by this assay, and inadequate  number of viral copies(<138 copies/mL). A negative result must be combined with clinical observations, patient history, and epidemiological information. The expected result is Negative.  Fact Sheet for Patients:  BloggerCourse.com  Fact Sheet for Healthcare Providers:  SeriousBroker.it  This test is no t yet approved or cleared by the [place] FDA and  has been authorized for detection and/or diagnosis of SARS-CoV-2 by FDA under an Emergency Use Authorization (EUA). This EUA will remain  in effect (meaning this test can be used) for the duration of the COVID-19 declaration under Section 564(b)(1) of the Act, 21 U.S.C.section 360bbb-3(b)(1), unless the authorization is terminated  or revoked sooner.       Influenza A by PCR NEGATIVE NEGATIVE Final   Influenza B by PCR NEGATIVE NEGATIVE Final    Comment: (NOTE) The Xpert Xpress SARS-CoV-2/FLU/RSV plus assay is intended as an aid in the diagnosis of influenza from Nasopharyngeal swab specimens and should not be used as a sole basis for treatment. Nasal washings and aspirates are unacceptable  for Xpert Xpress SARS-CoV-2/FLU/RSV testing.  Fact Sheet for Patients: BloggerCourse.com  Fact Sheet for Healthcare Providers: SeriousBroker.it  This test is not yet approved or cleared by the [place] FDA and has been authorized for detection and/or diagnosis of SARS-CoV-2 by FDA under an Emergency Use Authorization (EUA). This EUA will remain in effect (meaning this test can be used) for the duration of the COVID-19 declaration under Section 564(b)(1) of the Act, 21 U.S.C. section 360bbb-3(b)(1), unless the authorization is terminated or revoked.     Resp Syncytial Virus by PCR NEGATIVE NEGATIVE Final    Comment: (NOTE) Fact Sheet for Patients: BloggerCourse.com  Fact Sheet for Healthcare  Providers: SeriousBroker.it  This test is not yet approved or cleared by the [place] FDA and has been authorized for detection and/or diagnosis of SARS-CoV-2 by FDA under an Emergency Use Authorization (EUA). This EUA will remain in effect (meaning this test can be used) for the duration of the COVID-19 declaration under Section 564(b)(1) of the Act, 21 U.S.C. section 360bbb-3(b)(1), unless the authorization is terminated or revoked.  Performed at [facility], [address] [place], [place] [address]   Respiratory (~20 pathogens) panel by PCR     Status: None   Collection Time: 06/04/23 11:51 AM   Specimen: Nasopharyngeal Swab; Respiratory  Result Value Ref Range Status   Adenovirus NOT DETECTED NOT DETECTED Final   Coronavirus 229E NOT DETECTED NOT DETECTED Final    Comment: (NOTE) The Coronavirus on the Respiratory Panel, DOES NOT test for the novel  Coronavirus (2019 nCoV)    Coronavirus HKU1 NOT DETECTED NOT DETECTED Final   Coronavirus NL63 NOT DETECTED NOT DETECTED Final   Coronavirus OC43 NOT DETECTED NOT DETECTED Final   Metapneumovirus NOT DETECTED NOT DETECTED Final   Rhinovirus / Enterovirus NOT DETECTED NOT DETECTED Final   Influenza A NOT DETECTED NOT DETECTED Final   Influenza B NOT DETECTED NOT DETECTED Final   Parainfluenza Virus 1 NOT DETECTED NOT DETECTED Final   Parainfluenza Virus 2 NOT DETECTED NOT DETECTED Final   Parainfluenza Virus 3 NOT DETECTED NOT DETECTED Final   Parainfluenza Virus 4 NOT DETECTED NOT DETECTED Final   Respiratory Syncytial Virus NOT DETECTED NOT DETECTED Final   Bordetella pertussis NOT DETECTED NOT DETECTED Final   Bordetella Parapertussis NOT DETECTED NOT DETECTED Final   Chlamydophila pneumoniae NOT DETECTED NOT DETECTED Final   Mycoplasma pneumoniae NOT DETECTED NOT DETECTED Final    Comment: Performed at [facility], [address], [place], [place] [address]     Labs: BNP (last 3  results) No results for input(s): "BNP" in the last 8760 hours. Basic Metabolic Panel: Recent Labs  Lab 06/04/23 0923 06/05/23 0425  NA 140 138  K 3.6 3.7  CL 105 107  CO2 25 25  GLUCOSE 93 75  BUN 16 19  CREATININE 1.12 0.83  CALCIUM 8.7* 8.4*  MG  --  1.8   Liver Function Tests: No results for input(s): "AST", "ALT", "ALKPHOS", "BILITOT", "PROT", "ALBUMIN" in the last 168 hours. No results for input(s): "LIPASE", "AMYLASE" in the last 168 hours. No results for input(s): "AMMONIA" in the last 168 hours. CBC: Recent Labs  Lab 06/04/23 0923 06/05/23 0425  WBC 16.8* 16.2*  HGB 13.4 12.8*  HCT 41.5 38.5*  MCV 94.5 92.8  PLT 254 242   Cardiac Enzymes: No results for input(s): "CKTOTAL", "CKMB", "CKMBINDEX", "TROPONINI" in the last 168 hours. BNP: Invalid input(s): "POCBNP" CBG: No results for input(s): "GLUCAP"  in the last 168 hours. D-Dimer No results for input(s): "DDIMER" in the last 72 hours. Hgb A1c No results for input(s): "HGBA1C" in the last 72 hours. Lipid Profile No results for input(s): "CHOL", "HDL", "LDLCALC", "TRIG", "CHOLHDL", "LDLDIRECT" in the last 72 hours. Thyroid function studies No results for input(s): "TSH", "T4TOTAL", "T3FREE", "THYROIDAB" in the last 72 hours.  Invalid input(s): "FREET3" Anemia work up No results for input(s): "VITAMINB12", "FOLATE", "FERRITIN", "TIBC", "IRON", "RETICCTPCT" in the last 72 hours. Urinalysis    Component Value Date/Time   COLORURINE BROWN (A) 06/13/2021 0001   APPEARANCEUR CLEAR 06/13/2021 0001   LABSPEC >1.030 (H) 06/13/2021 0001   PHURINE 5.5 06/13/2021 0001   GLUCOSEU NEGATIVE 06/13/2021 0001   HGBUR LARGE (A) 06/13/2021 0001   BILIRUBINUR SMALL (A) 06/13/2021 0001   BILIRUBINUR negative 06/11/2021 0844   KETONESUR NEGATIVE 06/13/2021 0001   PROTEINUR 30 (A) 06/13/2021 0001   UROBILINOGEN 0.2 06/11/2021 0844   UROBILINOGEN 0.2 06/24/2011 0858   NITRITE NEGATIVE 06/13/2021 0001   LEUKOCYTESUR  NEGATIVE 06/13/2021 0001   Sepsis Labs Recent Labs  Lab 06/04/23 0923 06/05/23 0425  WBC 16.8* 16.2*   Microbiology Recent Results (from the past 240 hours)  Resp panel by RT-PCR (RSV, Flu A&B, Covid) Anterior Nasal Swab     Status: None   Collection Time: 06/04/23  9:55 AM   Specimen: Anterior Nasal Swab  Result Value Ref Range Status   SARS Coronavirus 2 by RT PCR NEGATIVE NEGATIVE Final    Comment: (NOTE) SARS-CoV-2 target nucleic acids are NOT DETECTED.  The SARS-CoV-2 RNA is generally detectable in upper respiratory specimens during the acute phase of infection. The lowest concentration of SARS-CoV-2 viral copies this assay can detect is 138 copies/mL. A negative result does not preclude SARS-Cov-2 infection and should not be used as the sole basis for treatment or other patient management decisions. A negative result may occur with  improper specimen collection/handling, submission of specimen other than nasopharyngeal swab, presence of viral mutation(s) within the areas targeted by this assay, and inadequate number of viral copies(<138 copies/mL). A negative result must be combined with clinical observations, patient history, and epidemiological information. The expected result is Negative.  Fact Sheet for Patients:  BloggerCourse.com  Fact Sheet for Healthcare Providers:  SeriousBroker.it  This test is no t yet approved or cleared by the [place] FDA and  has been authorized for detection and/or diagnosis of SARS-CoV-2 by FDA under an Emergency Use Authorization (EUA). This EUA will remain  in effect (meaning this test can be used) for the duration of the COVID-19 declaration under Section 564(b)(1) of the Act, 21 U.S.C.section 360bbb-3(b)(1), unless the authorization is terminated  or revoked sooner.       Influenza A by PCR NEGATIVE NEGATIVE Final   Influenza B by PCR NEGATIVE NEGATIVE Final    Comment:  (NOTE) The Xpert Xpress SARS-CoV-2/FLU/RSV plus assay is intended as an aid in the diagnosis of influenza from Nasopharyngeal swab specimens and should not be used as a sole basis for treatment. Nasal washings and aspirates are unacceptable for Xpert Xpress SARS-CoV-2/FLU/RSV testing.  Fact Sheet for Patients: BloggerCourse.com  Fact Sheet for Healthcare Providers: SeriousBroker.it  This test is not yet approved or cleared by the [place] FDA and has been authorized for detection and/or diagnosis of SARS-CoV-2 by FDA under an Emergency Use Authorization (EUA). This EUA will remain in effect (meaning this test can be used) for the duration of the COVID-19 declaration under Section 564(b)(1)  of the Act, 21 U.S.C. section 360bbb-3(b)(1), unless the authorization is terminated or revoked.     Resp Syncytial Virus by PCR NEGATIVE NEGATIVE Final    Comment: (NOTE) Fact Sheet for Patients: BloggerCourse.com  Fact Sheet for Healthcare Providers: SeriousBroker.it  This test is not yet approved or cleared by the [place] FDA and has been authorized for detection and/or diagnosis of SARS-CoV-2 by FDA under an Emergency Use Authorization (EUA). This EUA will remain in effect (meaning this test can be used) for the duration of the COVID-19 declaration under Section 564(b)(1) of the Act, 21 U.S.C. section 360bbb-3(b)(1), unless the authorization is terminated or revoked.  Performed at [facility], [address] [place], [place] [address]   Respiratory (~20 pathogens) panel by PCR     Status: None   Collection Time: 06/04/23 11:51 AM   Specimen: Nasopharyngeal Swab; Respiratory  Result Value Ref Range Status   Adenovirus NOT DETECTED NOT DETECTED Final   Coronavirus 229E NOT DETECTED NOT DETECTED Final    Comment: (NOTE) The Coronavirus on the Respiratory Panel, DOES NOT test  for the novel  Coronavirus (2019 nCoV)    Coronavirus HKU1 NOT DETECTED NOT DETECTED Final   Coronavirus NL63 NOT DETECTED NOT DETECTED Final   Coronavirus OC43 NOT DETECTED NOT DETECTED Final   Metapneumovirus NOT DETECTED NOT DETECTED Final   Rhinovirus / Enterovirus NOT DETECTED NOT DETECTED Final   Influenza A NOT DETECTED NOT DETECTED Final   Influenza B NOT DETECTED NOT DETECTED Final   Parainfluenza Virus 1 NOT DETECTED NOT DETECTED Final   Parainfluenza Virus 2 NOT DETECTED NOT DETECTED Final   Parainfluenza Virus 3 NOT DETECTED NOT DETECTED Final   Parainfluenza Virus 4 NOT DETECTED NOT DETECTED Final   Respiratory Syncytial Virus NOT DETECTED NOT DETECTED Final   Bordetella pertussis NOT DETECTED NOT DETECTED Final   Bordetella Parapertussis NOT DETECTED NOT DETECTED Final   Chlamydophila pneumoniae NOT DETECTED NOT DETECTED Final   Mycoplasma pneumoniae NOT DETECTED NOT DETECTED Final    Comment: Performed at [facility], [address] [place], [place] [address]     Time coordinating discharge: 35 minutes  SIGNED:   Erick Blinks, DO [facility] 06/06/2023, 9:30 AM  If 7PM-7AM, please contact night-coverage [email or web address]

## 2023-06-07 ENCOUNTER — Emergency Department (HOSPITAL_COMMUNITY): Payer: BC Managed Care – PPO

## 2023-06-07 ENCOUNTER — Emergency Department (HOSPITAL_COMMUNITY)
Admission: EM | Admit: 2023-06-07 | Discharge: 2023-06-07 | Disposition: A | Discharge status: Home / Self Care | Payer: BC Managed Care – PPO | Attending: Emergency Medicine | Admitting: Emergency Medicine

## 2023-06-07 ENCOUNTER — Other Ambulatory Visit: Payer: Self-pay

## 2023-06-07 ENCOUNTER — Telehealth: Payer: Self-pay

## 2023-06-07 DIAGNOSIS — Z7951 Long term (current) use of inhaled steroids: Secondary | ICD-10-CM | POA: Insufficient documentation

## 2023-06-07 DIAGNOSIS — J45909 Unspecified asthma, uncomplicated: Secondary | ICD-10-CM | POA: Insufficient documentation

## 2023-06-07 DIAGNOSIS — R918 Other nonspecific abnormal finding of lung field: Secondary | ICD-10-CM | POA: Diagnosis not present

## 2023-06-07 DIAGNOSIS — J181 Lobar pneumonia, unspecified organism: Secondary | ICD-10-CM | POA: Insufficient documentation

## 2023-06-07 DIAGNOSIS — J189 Pneumonia, unspecified organism: Secondary | ICD-10-CM

## 2023-06-07 DIAGNOSIS — D649 Anemia, unspecified: Secondary | ICD-10-CM | POA: Insufficient documentation

## 2023-06-07 DIAGNOSIS — R0781 Pleurodynia: Secondary | ICD-10-CM | POA: Diagnosis not present

## 2023-06-07 DIAGNOSIS — I10 Essential (primary) hypertension: Secondary | ICD-10-CM | POA: Diagnosis not present

## 2023-06-07 DIAGNOSIS — J4541 Moderate persistent asthma with (acute) exacerbation: Secondary | ICD-10-CM | POA: Diagnosis not present

## 2023-06-07 DIAGNOSIS — Z79899 Other long term (current) drug therapy: Secondary | ICD-10-CM | POA: Diagnosis not present

## 2023-06-07 DIAGNOSIS — R0789 Other chest pain: Secondary | ICD-10-CM

## 2023-06-07 DIAGNOSIS — R079 Chest pain, unspecified: Secondary | ICD-10-CM | POA: Diagnosis not present

## 2023-06-07 MED ORDER — OXYCODONE HCL 5 MG PO TABS: 5.0000 mg | ORAL_TABLET | ORAL | 0 refills | Status: DC | PRN

## 2023-06-07 MED ORDER — OXYCODONE-ACETAMINOPHEN 5-325 MG PO TABS
1.0000 | ORAL_TABLET | Freq: Once | ORAL | Status: AC
  Administered 2023-06-07: 1 via ORAL
  Filled 2023-06-07: qty 1

## 2023-06-07 MED ORDER — IBUPROFEN 400 MG PO TABS
400.0000 mg | ORAL_TABLET | Freq: Once | ORAL | Status: AC
  Administered 2023-06-07: 400 mg via ORAL
  Filled 2023-06-07: qty 1

## 2023-06-07 NOTE — Discharge Instructions (Addendum)
Apply ice to the area where you are having pain.  Ice can be applied for 30 minutes at a time, 4 times a day.  Consider buying an over-the-counter lidocaine patch to use as needed.  You may take acetaminophen and/or ibuprofen as needed for pain.  If you combine acetaminophen and ibuprofen, you will get better pain relief than you get from taking either medication by itself.  If you still need additional pain relief, you can take the oxycodone in addition to acetaminophen and ibuprofen.  Continue taking the antibiotic and the steroid that were prescribed for you when you left the hospital.  Return if symptoms or not being adequately controlled at home.

## 2023-06-07 NOTE — ED Notes (Signed)
Pt SPO2 89% on RA. Pt has Stephens UNDER HIS CHIN NOT USING IT. EDUCATED THE PT ON IMPORTANCE OF WEARING O2. Pt refused saying he doesn't need it and does not want to wear it.

## 2023-06-07 NOTE — Transitions of Care (Post Inpatient/ED Visit) (Signed)
   06/07/2023  Name: Drew Sandoval MRN: [id number] DOB: April 11, 1976  Today's TOC FU Call Status: Today's TOC FU Call Status:: Successful TOC FU Call Completed (Contacted pt but wasn't a successful TOC FU call as pt no longer sees Dr. Laural Benes of [facility]. Patient agreed to write down the number of the MD Provider line [phone], was urged to call them as soon as possible.Adm to [facility] 1/25-27 and ED 1/28) Patient's Name and Date of Birth confirmed.  Transition Care Management Follow-up Telephone Call Date of Discharge: 06/06/23 Discharge Facility: [name] ([facility]) Type of Discharge: Inpatient Admission Primary Inpatient Discharge Diagnosis:: ADM 1/25 - 1/27 Community Acquired Pneumonia of Left lower lobe, returned to ED 1/28 for Left-sided chest wall pain How have you been since you were released from the hospital?: Better Any questions or concerns?: No  Items Reviewed: Unable to review patient's status further as not a contracted patient of [facility]. Referred patient to MD Provider Line [phone]    Medications Reviewed Today: Medications Reviewed Today   Medications were not reviewed in this encounter     Home Care and Equipment/Supplies:    Functional Questionnaire:    Follow up appointments reviewed:     Alyse Low, RN, BA, [facility], CRRN [facility] Population Health Care Management Coordinator, Transition of Care Ph # [phone]

## 2023-06-07 NOTE — ED Provider Notes (Signed)
[place] EMERGENCY DEPARTMENT AT [facility] Provider Note   CSN: [id number] Arrival date & time: 06/07/23  0135     History  Chief Complaint  Patient presents with   Shortness of Breath   Chest Pain    Left ribs    Drew Sandoval is a 48 y.o. male.  The history is provided by the patient.  Shortness of Breath Associated symptoms: chest pain   Chest Pain Associated symptoms: shortness of breath   He has history of hypertension, asthma and was discharged from the hospital earlier today after being admitted for pneumonia.  He was given a prescription for a lidocaine patch but was unable to get it filled because it was not covered by his insurance and was too expensive for him.  He complains of pain in the left lateral rib cage which is worse with taking a breath or coughing.  He has been taking acetaminophen and ibuprofen for pain without relief.  He denies fever or chills and denies dyspnea.   Home Medications Prior to Admission medications   Medication Sig Start Date End Date Taking? Authorizing Provider  acetaminophen (TYLENOL) 500 MG tablet Take 1,000 mg by mouth every 6 (six) hours as needed for moderate pain.    [provider]  albuterol (VENTOLIN HFA) 108 (90 Base) MCG/ACT inhaler Inhale 2 puffs into the lungs every 6 (six) hours as needed for wheezing or shortness of breath. 06/06/23   Sherryll Burger, Pratik D, DO  amLODipine (NORVASC) 5 MG tablet TAKE 1 TABLET (5 MG TOTAL) BY MOUTH DAILY. Patient taking differently: Take 5 mg by mouth as needed (high blood pressure). 02/12/22   Marinus Maw, MD  Budeson-Glycopyrrol-Formoterol (BREZTRI AEROSPHERE) 160-9-4.8 MCG/ACT AERO Inhale 2 puffs into the lungs 2 (two) times daily. Rinse mouth 12/24/22   Glenford Bayley, NP  dexamethasone (DECADRON) 6 MG tablet Take 1 tablet (6 mg total) by mouth daily for 5 days. 06/07/23 06/12/23  Sherryll Burger, Pratik D, DO  Ensifentrine (OHTUVAYRE) 3 MG/2.5ML SUSP Inhale 2.5 mLs into the lungs 2  (two) times daily. 03/28/23   Jetty Duhamel D, MD  EPINEPHrine 0.3 mg/0.3 mL IJ SOAJ injection Inject into thigh for severe allergic reaction 09/28/22   Jetty Duhamel D, MD  levalbuterol (XOPENEX) 0.31 MG/3ML nebulizer solution Take 3 mLs (0.31 mg total) by nebulization every 4 (four) hours as needed for wheezing. 09/24/22   Kommor, Madison, MD  levofloxacin (LEVAQUIN) 750 MG tablet Take 1 tablet (750 mg total) by mouth daily for 4 days. 06/06/23 06/10/23  Sherryll Burger, Pratik D, DO  lidocaine (LIDODERM) 5 % Place 1 patch onto the skin daily. Remove & Discard patch within 12 hours or as directed by MD 06/07/23   Sherryll Burger, Pratik D, DO  montelukast (SINGULAIR) 10 MG tablet Take 1 tablet (10 mg total) by mouth at bedtime. Patient taking differently: Take 10 mg by mouth daily. 12/24/22   Glenford Bayley, NP  primidone (MYSOLINE) 50 MG tablet TAKE 4 TABLETS TWICE A DAY Patient taking differently: Take 200 mg by mouth 2 (two) times daily. 03/07/23   Tat, Octaviano Batty, DO      Allergies    Azithromycin, Banana, Solu-medrol [methylprednisolone], Alvesco [ciclesonide], Anoro ellipta [umeclidinium-vilanterol], and Penicillins    Review of Systems   Review of Systems  Respiratory:  Positive for shortness of breath.   Cardiovascular:  Positive for chest pain.  All other systems reviewed and are negative.   Physical Exam Updated Vital Signs BP (!) 159/71  Pulse 72   Temp 98.7 F (37.1 C) (Oral)   Resp 17   SpO2 93%  Physical Exam Vitals and nursing note reviewed.   48 year old male, resting comfortably and in no acute distress. Vital signs are significant for elevated blood pressure. Oxygen saturation is 93%, which is normal. Head is normocephalic and atraumatic. PERRLA, EOMI. Oropharynx is clear. Neck is nontender and supple without adenopathy. Lungs have coarse rales at the left base.  There are no wheezes or rhonchi. Chest is mildly tender in the left lateral rib cage inferiorly.  There is no  crepitus. Heart has regular rate and rhythm without murmur. Abdomen is soft, flat, nontender. Extremities have no cyanosis or edema, full range of motion is present. Skin is warm and dry without rash. Neurologic: Mental status is normal, cranial nerves are intact, moves all extremities equally.  ED Results / Procedures / Treatments    Radiology DG Ribs Unilateral W/Chest Left Result Date: 06/07/2023 CLINICAL DATA:  Left-sided pleuritic chest pain, initial encounter EXAM: LEFT RIBS AND CHEST - 3+ VIEW COMPARISON:  06/04/2023 FINDINGS: Cardiac shadow is stable. Lungs are well aerated bilaterally. Bibasilar airspace opacity is noted stable on the left and slightly increased on the right when compared with the prior exam. No acute rib abnormality noted. IMPRESSION: No rib fracture seen. Bibasilar airspace opacities slightly increased on the right when compared with the prior exam. Electronically Signed   By: Alcide Clever M.D.   On: 06/07/2023 03:29    Procedures Procedures    Medications Ordered in ED Medications - No data to display  ED Course/ Medical Decision Making/ A&P                                 Medical Decision Making Amount and/or Complexity of Data Reviewed Radiology: ordered.  Risk Prescription drug management.   Pleuritic chest pain in patient with known pneumonia.  I have reviewed his past records and do note hospitalization 06/04/2023-06/06/2023 for left lower lobe pneumonia.  Location of pneumonia corresponds to location of his pain.  I suspect his pain is secondary to his pneumonia.  However, he also has chest wall tenderness, he could have suffered a rib fracture from coughing.  I have ordered rib x-rays.  I have discussed with him consideration for using over-the-counter lidocaine patch.  He also will likely need stronger pain medication on a as needed basis.  We have x-ray showed no evidence of fracture, airspace disease appears slightly increased compared   Final  Clinical Impression(s) / ED Diagnoses Final diagnoses:  Left-sided chest wall pain  Community acquired pneumonia of left lower lobe of lung  Normochromic normocytic anemia    Rx / DC Orders ED Discharge Orders          Ordered    oxyCODONE (ROXICODONE) 5 MG immediate release tablet  Every 4 hours PRN        06/07/23 0644              [name], MD 06/07/23 [phone]

## 2023-06-07 NOTE — ED Triage Notes (Signed)
Pt states he was d/c yesterday with pneumonia of left lower lobe. C/o pain on left side, thinks he pulled a muscle from coughing. Was given lidocaine patch but states his insurance will not cover it.   Pt 88% on RA in triage, denies SOB.

## 2023-06-08 LAB — LEGIONELLA PNEUMOPHILA SEROGP 1 UR AG: L. pneumophila Serogp 1 Ur Ag: NEGATIVE

## 2023-06-12 NOTE — Progress Notes (Deleted)
 Patient will need to call Dupixent 's copay card company for additional fund support if they can provide. This is recurrent issue for patient and he shoiuyld be familiar with process. Unfortunately any specialty medication (biologic) is going to encounter this problem because it is an inherent insurance structuring problem.   If no luck with copay card company, patient will need samples for remainder of calendar year  Sherry Pennant, PharmD, MPH, BCPS, CPP Clinical Pharmacist (Rheumatology and Pulmonology)+   Patient ID: Drew Sandoval, male    DOB: 16-Aug-1975    MRN: [id number]  HPI M  former smoker followed for severe asthma/ COPD, chronic sinusitis, hyper-eosinophilia and hyper IgE, complicated by history SVT/cardioversion FENO 03/18/16- 100  High, indicates allergic asthma Office Spirometry 03/18/2016-severe obstructive airways disease FVC 2.62/51%, FEV1 1.30/31%, FEV1/FVC 0.50, FEF 25-75 0.61/14% Office Spirometry 01/25/17-severe obstructive airways disease. FVC 3.52/68%, FEV1 1.80/43%, ratio or 0.51, FEF 25-75% 0.76/18% IgE was too high for Xolair. He could not afford Nucala  or allergy  vaccine. CBC w diff 08/18/21- EOS H 17.6K/Ul Cinqair  infusion@ [employer]- changed to Dupixent  =================================================================================  02/18/23- 48 year old male former smoker (5.75 pk yrs) followed for severe Asthma/ COPD/ Dupixent , chronic Sinusitis, persistent Eosinophilia, hyper IgE-too high for Xolair, history SVT/cardioversion/ ablation, Kidney stone, Covid infection Jan, 2022, HTN, Tremor,  -Breztri , Dupixent , Singulair , Ventolin  hfa, neb levalbuterol  ED x3 in last month for asthma. Admission 9/27-9/28. ACT score 7 Had flu shot. Finished prednisone  from last ED visit. Not using Dupixent - insurance not covering. See note from [facility]/pharmacy. Not using Breztri - pharmacy hasn't said it has come in yet. He feels he needs more antibiotic. Prednisone  ends today, but  does help. We are able to provide Breztri  samples and Dupixent  samples today- short term. CXR 02/04/23- MPRESSION: Hyperinflation and central airway thickening compatible with bronchitis or reactive airways disease.  03/28/23- 48 year old male former smoker (5.75 pk yrs) followed for severe Asthma/ COPD/ Dupixent , chronic Sinusitis, persistent Eosinophilia, hyper IgE-too high for Xolair, history SVT/cardioversion/ ablation, Kidney stone, Covid infection Jan, 2022, HTN, Tremor,  -Breztri , Dupixent , Singulair , Ventolin  hfa, neb levalbuterol  ------Asthma Still coughing He has prednisone  at home that he uses when he is having an exacerbation, otherwise he tries to stay off of it.  We discussed his medication options and we will try adding Ohtuvayre  nebs.  Our pharmacy team may be able to help with this. His mother was a smoker who died of lung cancer.  He does not know how much she smoked but expressed concern that his breathing problems were similar.  We discussed chest x-ray from September bronchitis and reactive airways disease but no suspicion of lung mass.  06/14/23- 48 year old male former smoker (5.75 pk yrs) followed for severe Asthma/ COPD/ Dupixent , chronic Sinusitis, persistent Eosinophilia, hyper IgE-too high for Xolair, history SVT/cardioversion/ ablation, Kidney stone, Covid infection Jan, 2022, HTN, Tremor,  -Breztri , Dupixent , Singulair , Ventolin  hfa, neb levalbuterol  Hosp 1/225-1/27  LLL pneumonia and L chest pain>>levaquin , (too expensive, changed to oxycodone ), decadron  taper     ROS-see HPI  + = positive Constitutional:    weight loss, night sweats, fevers, chills, fatigue, lassitude. HEENT:    headaches, difficulty swallowing, tooth/dental problems, sore throat,       sneezing, itching, ear ache, +nasal congestion, +post nasal drip, snoring,  CV:    chest pain, orthopnea, PND, swelling in lower extremities, anasarca,  dizziness, +palpitations Resp:   +shortness of breath with  exertion or at rest.  productive cough,  +non-productive cough, coughing up of blood.              change in color of mucus.  +wheezing.   Skin:    rash or lesions. GI:  No-   heartburn, indigestion, abdominal pain, nausea, vomiting,  GU:  MS:   joint pain, stiffness, decreased range of motion, back pain. Neuro-     nothing unusual Psych:  change in mood or affect.  depression or anxiety.   memory loss.  Objective:   Physical Exam General- Alert, Oriented, Affect-reserved/ laconic, Distress-none acute;  Tall, thin Skin- +tatoos Lymphadenopathy- none Head- atraumatic            Eyes- Gross vision intact, PERRLA, conjunctivae clear secretions            Ears- Hearing, canals normal            Nose- +clear, no-Septal dev, mucus, polyps, erosion, perforation             Throat- Mallampati II , mucosa clear , drainage- none, tonsils- atrophic Neck- flexible , trachea midline, no stridor , thyroid  nl, carotid no bruit Chest - symmetrical excursion , unlabored           Heart/CV- RR/ +frequent early beats  , no murmur , no gallop  , no rub, nl s1 s2                           - JVD- none , edema- none, stasis changes- none, varices- none           Lung-   +Diminished/ clear, unlabored,  Cough+dry,  dullness-none, rub- none,  Wheeze-none Abd- scaphoid  Br/ Gen/ Rectal- Not done, not indicated Extrem- cyanosis- none, clubbing, none, atrophy- none, strength- nl Neuro- unremarkable

## 2023-06-14 ENCOUNTER — Ambulatory Visit: Payer: BC Managed Care – PPO | Admitting: Internal Medicine

## 2023-06-15 NOTE — Telephone Encounter (Signed)
 I haven't seen it yet. He is transferring care to Dr Waymond Hailey so he can be seen in [place].

## 2023-06-22 NOTE — Progress Notes (Unsigned)
Drew Sandoval, male    DOB: 02/04/1976    MRN: [id number]   Brief patient profile:  47  yowb  quit smoking at onset of 2011 while working [employer] (p 3rd year)  referred to pulmonary clinic in [place]  06/23/2023 by [facility] service   for asthma    Dr Maple Hudson Pt on dupixent / ohtuvayre   Admit date: 06/04/2023 Discharge date: 06/06/2023   Admitted From:Home Disposition:  Home   Recommendations for Outpatient Follow-up:  Follow up with pulmonology, Dr. Sherene Sires as scheduled in February Continue on Decadron for 5 days as prescribed Finish course of Levaquin with 4 more days as prescribed Refills given on albuterol inhaler to use for shortness of breath or wheezing Lidocaine patch given for left-sided chest pain Continue other home medications as prior Patient remains high risk for readmission    Brief/Interim Summary: Drew Sandoval is a 48 y.o. male with medical history significant for severe persistent asthma, hyper IgE syndrome, SVT status post ablation, hypertension, and essential tremor who has apparently been struggling with waxing and waning symptoms of shortness of breath along with cough over the last 1.5 months.  He states he has had worsening shortness of breath as well as left-sided chest pain that has worsened over the last several days.  He has been admitted for acute hypoxemic respiratory failure secondary to suspected community-acquired pneumonia with associated asthma exacerbation.  He is overall feeling much better today and is eager for discharge.  He no longer has any oxygen requirements.  He will follow-up with pulmonology outpatient.     Discharge Diagnoses:  Principal Problem:   Acute hypoxemic respiratory failure (HCC):   Asthma, severe persistent   Allergic eosinophilia   Hyper-IgE syndrome (HCC)   SVT (supraventricular tachycardia) (HCC)   Hypertension, essential   Essential tremor   Thyroid nodule   Acute asthma exacerbation   Lobar pneumonia  (HCC)   Principal discharge diagnosis: Acute hypoxemic respiratory failure secondary to community-acquired pneumonia and acute asthma exacerbation.     History of Present Illness  06/23/2023  Pulmonary/ 1st office eval/ Rylah Fukuda / [facility] on [address] (one month).  Did not feel dupiexent worked p 4 m so stopped in July 2024 by insurance  Chief Complaint  Patient presents with   Asthma  Dyspnea:  x 100 ft slow pace  Cough: lots of coughing x July 2014 dry sounding worse with exertion and lie down x sev hours  / prednisone helps a lot  Sleep: bed is flat with 3 pillows mostly  SABA use: on job uses hfa up 4-5 times in  8 h and at home once at home / neb avg once every other day  02: none     No obvious day to day or daytime pattern/variability or assoc excess/ purulent sputum or mucus plugs or hemoptysis or cp or chest tightness, subjective wheeze or overt sinus or hb symptoms.    Also denies any obvious fluctuation of symptoms with weather or environmental changes or other aggravating or alleviating factors except as outlined above   No unusual exposure hx or h/o childhood pna/ asthma or knowledge of premature birth.  Current Allergies, Complete Past Medical History, Past Surgical History, Family History, and Social History were reviewed in [employer] record.  ROS  The following are not active complaints unless bolded Hoarseness, sore throat, dysphagia, dental problems, itching, sneezing,  nasal congestion or discharge of excess mucus or purulent secretions, ear ache,  fever, chills, sweats, unintended wt loss or wt gain, classically pleuritic or exertional cp,  orthopnea pnd or arm/hand swelling  or leg swelling, presyncope, palpitations, abdominal pain, anorexia, nausea, vomiting, diarrhea  or change in bowel habits or change in bladder habits, change in stools or change in urine, dysuria, hematuria,  rash, arthralgias, visual complaints,  headache, numbness, weakness or ataxia or problems with walking or coordination,  change in mood or  memory.            Outpatient Medications Prior to Visit  Medication Sig Dispense Refill   acetaminophen (TYLENOL) 500 MG tablet Take 1,000 mg by mouth every 6 (six) hours as needed for moderate pain.     albuterol (VENTOLIN HFA) 108 (90 Base) MCG/ACT inhaler Inhale 2 puffs into the lungs every 6 (six) hours as needed for wheezing or shortness of breath. 8 g 2   amLODipine (NORVASC) 5 MG tablet TAKE 1 TABLET (5 MG TOTAL) BY MOUTH DAILY. (Patient taking differently: Take 5 mg by mouth as needed (high blood pressure).) 15 tablet 0   Budeson-Glycopyrrol-Formoterol (BREZTRI AEROSPHERE) 160-9-4.8 MCG/ACT AERO Inhale 2 puffs into the lungs 2 (two) times daily. Rinse mouth 10.7 g 11   Ensifentrine (OHTUVAYRE) 3 MG/2.5ML SUSP Inhale 2.5 mLs into the lungs 2 (two) times daily. 150 mL 2   EPINEPHrine 0.3 mg/0.3 mL IJ SOAJ injection Inject into thigh for severe allergic reaction 1 each prn   levalbuterol (XOPENEX) 0.31 MG/3ML nebulizer solution Take 3 mLs (0.31 mg total) by nebulization every 4 (four) hours as needed for wheezing. 75 mL 12   lidocaine (LIDODERM) 5 % Place 1 patch onto the skin daily. Remove & Discard patch within 12 hours or as directed by MD 30 patch 0   montelukast (SINGULAIR) 10 MG tablet Take 1 tablet (10 mg total) by mouth at bedtime. (Patient taking differently: Take 10 mg by mouth daily.) 30 tablet 11   oxyCODONE (ROXICODONE) 5 MG immediate release tablet Take 1 tablet (5 mg total) by mouth every 4 (four) hours as needed for severe pain (pain score 7-10). 10 tablet 0   primidone (MYSOLINE) 50 MG tablet TAKE 4 TABLETS TWICE A DAY (Patient taking differently: Take 200 mg by mouth 2 (two) times daily.) 720 tablet 0   No facility-administered medications prior to visit.    Past Medical History:  Diagnosis Date   Acute conjunctivitis of right eye 10/07/2014   Acute respiratory failure  with hypoxia (HCC) 07/23/2013   Asthma    Asthma    Phreesia 06/26/2020   Asthma, severe persistent 07/16/2010   PFT >> spiro 2012 with FEV1 1.14 (28%), ratio 45 Alpha 1 (08/12/10) >>MM ([facility])  IGE 1747, positive allergy profile >> Elevated eos on CBC S/p intubation 10/2010 Job exposure to respiratory irritant - bleach, chicken Allergy vaccine begun 11/09/2010. DC'd by pt 2013- resumed 2013> 1:50 GH; DC'd [facility] 9/24-9/27/ 2013- Status asthma, no vent. [facility] 3/ 2015- status ast   COVID-19 virus infection 06/17/2020   January, 2022. 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   SVT (supraventricular tachycardia) (HCC) 09/24/2014      Objective:  BP (!) 166/105   Pulse 86   Ht 6\' 3"  (1.905 m)   Wt 175 lb (79.4 kg)   SpO2 92%   BMI 21.87 kg/m   SpO2: 92 % pleasant tremulous amb bm nad  Pan exp wheeze       Assessment   No problem-specific Assessment & Plan notes found for this encounter.     Sandrea Hughs, MD 06/23/2023

## 2023-06-23 ENCOUNTER — Encounter: Payer: Self-pay | Admitting: Internal Medicine

## 2023-06-23 ENCOUNTER — Ambulatory Visit (INDEPENDENT_AMBULATORY_CARE_PROVIDER_SITE_OTHER): Payer: BC Managed Care – PPO | Admitting: Internal Medicine

## 2023-06-23 VITALS — BP 166/100 | HR 86 | Ht 75.0 in | Wt 175.0 lb

## 2023-06-23 DIAGNOSIS — I10 Essential (primary) hypertension: Secondary | ICD-10-CM | POA: Diagnosis not present

## 2023-06-23 DIAGNOSIS — J4550 Severe persistent asthma, uncomplicated: Secondary | ICD-10-CM | POA: Diagnosis not present

## 2023-06-23 MED ORDER — PREDNISONE 10 MG PO TABS: ORAL_TABLET | ORAL | 0 refills | Status: DC

## 2023-06-23 MED ORDER — FAMOTIDINE 20 MG PO TABS: ORAL_TABLET | ORAL | 11 refills | Status: AC

## 2023-06-23 MED ORDER — PANTOPRAZOLE SODIUM 40 MG PO TBEC: 40.0000 mg | DELAYED_RELEASE_TABLET | Freq: Every day | ORAL | 2 refills | Status: DC

## 2023-06-23 NOTE — Patient Instructions (Addendum)
Plan A = Automatic = Always=    Breztri Take 2 puffs first thing in am and then another 2 puffs about 12 hours later.    Work on inhaler technique:  relax and gently blow all the way out then take a nice smooth full deep breath back in, triggering the inhaler at same time you start breathing in.  Hold breath in for at least  5 seconds if you can. Blow out breztri  thru nose. Rinse and gargle with water when done.  If mouth or throat bother you at all,  try brushing teeth/gums/tongue with arm and hammer toothpaste/ make a slurry and gargle and spit out.   >>>  Remember how golfers warm up by taking practice swings - do this with an empty inhaler   Plan B = Backup (to supplement plan A, not to replace it) Only use your albuterol inhaler as a rescue medication to be used if you can't catch your breath by resting or doing a relaxed purse lip breathing pattern.  - The less you use it, the better it will work when you need it. - Ok to use the inhaler up to 2 puffs  every 4 hours if you must but call for appointment if use goes up over your usual need - Don't leave home without it !!  (think of it like the spare tire for your car)   Plan C = Crisis (instead of Plan B but only if Plan B stops working) - only use your levoalbuterol nebulizer if you first try Plan B and it fails to help > ok to use the nebulizer up to every 4 hours but if start needing it regularly call for immediate appointment   Prednisone 10 mg  2 each am until better then 1 daily x 5 days and stop   Pantoprazole (protonix) 40 mg   Take  30-60 min before first meal of the day and Pepcid (famotidine)  20 mg after supper until return to office - this is the best way to tell whether stomach acid is contributing to your problem.    GERD (REFLUX)  is an extremely common cause of respiratory symptoms just like yours , many times with no obvious heartburn at all.    It can be treated with medication, but also with lifestyle changes including  elevation of the head of your bed (ideally with 6 -8inch blocks under the headboard of your bed),  Smoking cessation, avoidance of late meals, excessive alcohol, and avoid fatty foods, chocolate, peppermint, colas, red wine, and acidic juices such as orange juice.  NO MINT OR MENTHOL PRODUCTS SO NO COUGH DROPS  USE SUGARLESS CANDY INSTEAD (Jolley ranchers or Stover's or Life Savers) or even ice chips will also do - the key is to swallow to prevent all throat clearing. NO OIL BASED VITAMINS - use powdered substitutes.  Avoid fish oil when coughing.    Please schedule a follow up office visit in 4 weeks, sooner if needed  with all medications /inhalers/ solutions in hand so we can verify exactly what you are taking. This includes all medications from all doctors and over the counters

## 2023-06-24 NOTE — Assessment & Plan Note (Signed)
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 DC'd by pt 2013- resumed 2013> 1:50 GH; DC'd -[facility] 9/24-9/27/ 2013- Status asthma, no vent. [facility] 3/ 2015- status asthma Nucala therapy begun 2016- dc'd [facility] - Trial of theoph 11/19/13 - 12/10/2013 d/c and rx max gerd rx  Office spirometry 10/09/14- Severe obstructive disease- FVC 2.63/ 52%, FEV1 0.97/ 24%, FEV1/FVC 0.37, FEF25-75% 0.36/ 8% Eosinophils 14% Allergy profile done 07/17/2014 showed very high total IgE 3283 FENO 03/18/16- 100    Office Spirometry 01/25/17-severe obstructive airways disease. FVC 3.52/68%, FEV1 1.80/43%, ratio or 0.51, FEF 25-75% 0.76/18% 0htuvayre trial 05/17/22 >>> - 06/23/2023  After extensive coaching inhaler device,  effectiveness =    50% from a baseline of 25%   DDX of  difficult airways management almost all start with A and  include Adherence, Ace Inhibitors, Acid Reflux, Active Sinus Disease, Alpha 1 Antitripsin deficiency, Anxiety masquerading as Airways dz,  ABPA,  Allergy(esp in young), Aspiration (esp in elderly), Adverse effects of meds,  Active smoking or vaping, A bunch of PE's (a small clot burden can't cause this syndrome unless there is already severe underlying pulm or vascular dz with poor reserve) plus two Bs  = Bronchiectasis and Beta blocker use..and one C= CHF   Adherence is always the initial "prime suspect" and is a multilayered concern that requires a "trust but verify" approach in every patient - starting with knowing how to use medications, especially inhalers, correctly, keeping up with refills and understanding the fundamental difference between maintenance and prns vs those medications only taken for a very short course and then stopped and not refilled.  - see hfa  teaching - return with all meds in hand using a trust but verify approach to confirm accurate Medication  Reconciliation The principal here is that until we are certain that the  patients are doing what we've asked, it makes no sense to ask them to do more.    Allergy still very refractory to multiple agents > no option here but start prednisone 20 mg until better then taper off > hopefully with optimal hfa and max breztri will be able to minimize  pred exposure   ? Acid (or non-acid) GERD > always difficult to exclude as up to 75% of pts in some series report no assoc GI/ Heartburn symptoms> rec max (24h)  acid suppression and diet restrictions/ reviewed and instructions given in writing.   F/u in 4 weeks - call sooner if needed

## 2023-06-24 NOTE — Assessment & Plan Note (Signed)
Advised to avoid salt and double amlodipine if needed   Would avoid BB here other than Bisoprolol          Each maintenance medication was reviewed in detail including emphasizing most importantly the difference between maintenance and prns and under what circumstances the prns are to be triggered using an action plan format where appropriate.  Total time for H and P, chart review, counseling, reviewing hfa/neb device(s) and generating customized AVS unique to this office visit / same day charting = 48 min for refractory asthma pt new to me

## 2023-06-28 NOTE — Telephone Encounter (Signed)
 Per Dr. Thurston Hole chart notes, pt has been successfully established on [facility] therapy. Closing encounter.

## 2023-07-11 ENCOUNTER — Emergency Department (HOSPITAL_COMMUNITY)

## 2023-07-11 ENCOUNTER — Other Ambulatory Visit: Payer: Self-pay

## 2023-07-11 ENCOUNTER — Inpatient Hospital Stay (HOSPITAL_COMMUNITY)
Admission: EM | Admit: 2023-07-11 | Discharge: 2023-07-17 | DRG: 193 | Disposition: A | Discharge status: Home / Self Care | Attending: Internal Medicine | Admitting: Internal Medicine

## 2023-07-11 ENCOUNTER — Encounter (HOSPITAL_COMMUNITY): Payer: Self-pay

## 2023-07-11 DIAGNOSIS — Z881 Allergy status to other antibiotic agents status: Secondary | ICD-10-CM | POA: Diagnosis not present

## 2023-07-11 DIAGNOSIS — Z8701 Personal history of pneumonia (recurrent): Secondary | ICD-10-CM

## 2023-07-11 DIAGNOSIS — D824 Hyperimmunoglobulin E [IgE] syndrome: Secondary | ICD-10-CM | POA: Diagnosis not present

## 2023-07-11 DIAGNOSIS — Z880 Allergy status to penicillin: Secondary | ICD-10-CM | POA: Diagnosis not present

## 2023-07-11 DIAGNOSIS — G8929 Other chronic pain: Secondary | ICD-10-CM | POA: Diagnosis not present

## 2023-07-11 DIAGNOSIS — I10 Essential (primary) hypertension: Secondary | ICD-10-CM | POA: Diagnosis present

## 2023-07-11 DIAGNOSIS — J440 Chronic obstructive pulmonary disease with acute lower respiratory infection: Secondary | ICD-10-CM | POA: Diagnosis not present

## 2023-07-11 DIAGNOSIS — Z1152 Encounter for screening for COVID-19: Secondary | ICD-10-CM

## 2023-07-11 DIAGNOSIS — R079 Chest pain, unspecified: Secondary | ICD-10-CM | POA: Diagnosis not present

## 2023-07-11 DIAGNOSIS — J441 Chronic obstructive pulmonary disease with (acute) exacerbation: Secondary | ICD-10-CM | POA: Insufficient documentation

## 2023-07-11 DIAGNOSIS — Z79899 Other long term (current) drug therapy: Secondary | ICD-10-CM

## 2023-07-11 DIAGNOSIS — E042 Nontoxic multinodular goiter: Secondary | ICD-10-CM | POA: Diagnosis not present

## 2023-07-11 DIAGNOSIS — Z825 Family history of asthma and other chronic lower respiratory diseases: Secondary | ICD-10-CM | POA: Diagnosis not present

## 2023-07-11 DIAGNOSIS — Z87891 Personal history of nicotine dependence: Secondary | ICD-10-CM

## 2023-07-11 DIAGNOSIS — J45901 Unspecified asthma with (acute) exacerbation: Secondary | ICD-10-CM | POA: Diagnosis not present

## 2023-07-11 DIAGNOSIS — J1001 Influenza due to other identified influenza virus with the same other identified influenza virus pneumonia: Secondary | ICD-10-CM | POA: Diagnosis not present

## 2023-07-11 DIAGNOSIS — J439 Emphysema, unspecified: Secondary | ICD-10-CM | POA: Diagnosis not present

## 2023-07-11 DIAGNOSIS — R591 Generalized enlarged lymph nodes: Secondary | ICD-10-CM | POA: Diagnosis not present

## 2023-07-11 DIAGNOSIS — Z8616 Personal history of COVID-19: Secondary | ICD-10-CM | POA: Diagnosis not present

## 2023-07-11 DIAGNOSIS — Z833 Family history of diabetes mellitus: Secondary | ICD-10-CM

## 2023-07-11 DIAGNOSIS — Z91018 Allergy to other foods: Secondary | ICD-10-CM

## 2023-07-11 DIAGNOSIS — J4551 Severe persistent asthma with (acute) exacerbation: Secondary | ICD-10-CM | POA: Diagnosis not present

## 2023-07-11 DIAGNOSIS — Z7951 Long term (current) use of inhaled steroids: Secondary | ICD-10-CM

## 2023-07-11 DIAGNOSIS — Z888 Allergy status to other drugs, medicaments and biological substances status: Secondary | ICD-10-CM | POA: Diagnosis not present

## 2023-07-11 DIAGNOSIS — R0602 Shortness of breath: Secondary | ICD-10-CM | POA: Diagnosis not present

## 2023-07-11 DIAGNOSIS — G250 Essential tremor: Secondary | ICD-10-CM | POA: Diagnosis present

## 2023-07-11 DIAGNOSIS — J9601 Acute respiratory failure with hypoxia: Secondary | ICD-10-CM | POA: Diagnosis not present

## 2023-07-11 DIAGNOSIS — J101 Influenza due to other identified influenza virus with other respiratory manifestations: Secondary | ICD-10-CM | POA: Insufficient documentation

## 2023-07-11 DIAGNOSIS — Z8261 Family history of arthritis: Secondary | ICD-10-CM

## 2023-07-11 DIAGNOSIS — E0520 Thyrotoxicosis with toxic multinodular goiter without thyrotoxic crisis or storm: Secondary | ICD-10-CM | POA: Diagnosis not present

## 2023-07-11 DIAGNOSIS — R918 Other nonspecific abnormal finding of lung field: Secondary | ICD-10-CM | POA: Diagnosis not present

## 2023-07-11 DIAGNOSIS — E041 Nontoxic single thyroid nodule: Secondary | ICD-10-CM | POA: Diagnosis not present

## 2023-07-11 DIAGNOSIS — Z801 Family history of malignant neoplasm of trachea, bronchus and lung: Secondary | ICD-10-CM

## 2023-07-11 LAB — COMPREHENSIVE METABOLIC PANEL
ALT: 19 U/L (ref 0–44)
AST: 14 U/L — ABNORMAL LOW (ref 15–41)
Albumin: 3.2 g/dL — ABNORMAL LOW (ref 3.5–5.0)
Alkaline Phosphatase: 62 U/L (ref 38–126)
Anion gap: 8 (ref 5–15)
BUN: 15 mg/dL (ref 6–20)
CO2: 22 mmol/L (ref 22–32)
Calcium: 8.5 mg/dL — ABNORMAL LOW (ref 8.9–10.3)
Chloride: 109 mmol/L (ref 98–111)
Creatinine, Ser: 0.73 mg/dL (ref 0.61–1.24)
GFR, Estimated: >60 mL/min (ref >60)
Glucose, Bld: 114 mg/dL — ABNORMAL HIGH (ref 70–99)
Potassium: 3.9 mmol/L (ref 3.5–5.1)
Sodium: 139 mmol/L (ref 135–145)
Total Bilirubin: 0.4 mg/dL (ref 0.0–1.2)
Total Protein: 8.2 g/dL — ABNORMAL HIGH (ref 6.5–8.1)

## 2023-07-11 LAB — CBC WITH DIFFERENTIAL/PLATELET
Abs Immature Granulocytes: 0 10*3/uL (ref 0.00–0.07)
Basophils Absolute: 0 10*3/uL (ref 0.0–0.1)
Basophils Relative: 0 %
Eosinophils Absolute: 0.5 10*3/uL (ref 0.0–0.5)
Eosinophils Relative: 7 %
HCT: 41 % (ref 39.0–52.0)
Hemoglobin: 13.3 g/dL (ref 13.0–17.0)
Lymphocytes Relative: 20 %
Lymphs Abs: 1.3 10*3/uL (ref 0.7–4.0)
MCH: 30.2 pg (ref 26.0–34.0)
MCHC: 32.4 g/dL (ref 30.0–36.0)
MCV: 93.2 fL (ref 80.0–100.0)
Monocytes Absolute: 0.1 10*3/uL (ref 0.1–1.0)
Monocytes Relative: 2 %
Neutro Abs: 4.7 10*3/uL (ref 1.7–7.7)
Neutrophils Relative %: 71 %
Platelets: 244 10*3/uL (ref 150–400)
RBC: 4.4 MIL/uL (ref 4.22–5.81)
RDW: 15.9 % — ABNORMAL HIGH (ref 11.5–15.5)
WBC: 6.6 10*3/uL (ref 4.0–10.5)
nRBC: 0 % (ref 0.0–0.2)

## 2023-07-11 LAB — RESP PANEL BY RT-PCR (RSV, FLU A&B, COVID)  RVPGX2
Influenza A by PCR: NEGATIVE
Influenza B by PCR: NEGATIVE
Resp Syncytial Virus by PCR: NEGATIVE
SARS Coronavirus 2 by RT PCR: NEGATIVE

## 2023-07-11 LAB — BRAIN NATRIURETIC PEPTIDE: B Natriuretic Peptide: 53 pg/mL (ref 0.0–100.0)

## 2023-07-11 LAB — PROCALCITONIN: Procalcitonin: <0.1 ng/mL

## 2023-07-11 LAB — TSH: TSH: 0.311 u[IU]/mL — ABNORMAL LOW (ref 0.350–4.500)

## 2023-07-11 LAB — TROPONIN I (HIGH SENSITIVITY): Troponin I (High Sensitivity): 4 ng/L (ref <18)

## 2023-07-11 MED ORDER — OXYCODONE HCL 5 MG PO TABS
5.0000 mg | ORAL_TABLET | Freq: Four times a day (QID) | ORAL | Status: DC | PRN
  Administered 2023-07-11 – 2023-07-15 (×5): 5 mg via ORAL
  Filled 2023-07-11 (×5): qty 1

## 2023-07-11 MED ORDER — ALBUTEROL SULFATE (2.5 MG/3ML) 0.083% IN NEBU
2.5000 mg | INHALATION_SOLUTION | RESPIRATORY_TRACT | Status: DC | PRN
Start: 2023-07-11 — End: 2023-07-12
  Administered 2023-07-12: 2.5 mg via RESPIRATORY_TRACT
  Filled 2023-07-11: qty 3

## 2023-07-11 MED ORDER — ENSIFENTRINE 3 MG/2.5ML IN SUSP: 2.5000 mL | Freq: Two times a day (BID) | RESPIRATORY_TRACT | Status: DC

## 2023-07-11 MED ORDER — PRIMIDONE 50 MG PO TABS
200.0000 mg | ORAL_TABLET | Freq: Two times a day (BID) | ORAL | Status: DC
  Administered 2023-07-11 – 2023-07-17 (×12): 200 mg via ORAL
  Filled 2023-07-11 (×13): qty 4

## 2023-07-11 MED ORDER — IOHEXOL 300 MG/ML  SOLN
75.0000 mL | Freq: Once | INTRAMUSCULAR | Status: AC | PRN
  Administered 2023-07-11: 75 mL via INTRAVENOUS

## 2023-07-11 MED ORDER — ONDANSETRON HCL 4 MG/2ML IJ SOLN: 4.0000 mg | Freq: Four times a day (QID) | INTRAMUSCULAR | Status: DC | PRN

## 2023-07-11 MED ORDER — POLYETHYLENE GLYCOL 3350 17 G PO PACK
17.0000 g | PACK | Freq: Every day | ORAL | Status: DC | PRN
  Administered 2023-07-14: 17 g via ORAL
  Filled 2023-07-11: qty 1

## 2023-07-11 MED ORDER — ACETAMINOPHEN 325 MG PO TABS
650.0000 mg | ORAL_TABLET | Freq: Four times a day (QID) | ORAL | Status: DC | PRN
  Administered 2023-07-12 (×2): 650 mg via ORAL
  Filled 2023-07-11 (×2): qty 2

## 2023-07-11 MED ORDER — PANTOPRAZOLE SODIUM 40 MG PO TBEC
40.0000 mg | DELAYED_RELEASE_TABLET | Freq: Every day | ORAL | Status: DC
  Administered 2023-07-12 – 2023-07-17 (×6): 40 mg via ORAL
  Filled 2023-07-11 (×6): qty 1

## 2023-07-11 MED ORDER — LEVALBUTEROL HCL 0.63 MG/3ML IN NEBU: 0.6300 mg | INHALATION_SOLUTION | Freq: Once | RESPIRATORY_TRACT | Status: DC

## 2023-07-11 MED ORDER — ACETAMINOPHEN 650 MG RE SUPP: 650.0000 mg | Freq: Four times a day (QID) | RECTAL | Status: DC | PRN

## 2023-07-11 MED ORDER — IPRATROPIUM-ALBUTEROL 0.5-2.5 (3) MG/3ML IN SOLN
3.0000 mL | Freq: Once | RESPIRATORY_TRACT | Status: AC
  Administered 2023-07-11: 3 mL via RESPIRATORY_TRACT
  Filled 2023-07-11: qty 3

## 2023-07-11 MED ORDER — SODIUM CHLORIDE 0.9 % IV SOLN
100.0000 mg | Freq: Two times a day (BID) | INTRAVENOUS | Status: DC
  Administered 2023-07-12 (×2): 100 mg via INTRAVENOUS
  Filled 2023-07-11 (×5): qty 100

## 2023-07-11 MED ORDER — MONTELUKAST SODIUM 10 MG PO TABS
10.0000 mg | ORAL_TABLET | Freq: Every day | ORAL | Status: DC
Start: 2023-07-12 — End: 2023-07-17
  Administered 2023-07-12 – 2023-07-17 (×6): 10 mg via ORAL
  Filled 2023-07-11 (×6): qty 1

## 2023-07-11 MED ORDER — SODIUM CHLORIDE 0.9 % IV SOLN
2.0000 g | INTRAVENOUS | Status: DC
  Administered 2023-07-12: 2 g via INTRAVENOUS
  Filled 2023-07-11: qty 20

## 2023-07-11 MED ORDER — PREDNISONE 20 MG PO TABS
40.0000 mg | ORAL_TABLET | Freq: Every day | ORAL | Status: DC
  Filled 2023-07-11: qty 2

## 2023-07-11 MED ORDER — ONDANSETRON HCL 4 MG PO TABS: 4.0000 mg | ORAL_TABLET | Freq: Four times a day (QID) | ORAL | Status: DC | PRN

## 2023-07-11 MED ORDER — ALBUTEROL SULFATE HFA 108 (90 BASE) MCG/ACT IN AERS
2.0000 | INHALATION_SPRAY | RESPIRATORY_TRACT | Status: DC | PRN
  Administered 2023-07-11: 2 via RESPIRATORY_TRACT
  Filled 2023-07-11: qty 6.7

## 2023-07-11 MED ORDER — GUAIFENESIN-DM 100-10 MG/5ML PO SYRP
15.0000 mL | ORAL_SOLUTION | Freq: Three times a day (TID) | ORAL | Status: DC
  Administered 2023-07-11 – 2023-07-12 (×2): 15 mL via ORAL
  Filled 2023-07-11 (×2): qty 15

## 2023-07-11 MED ORDER — KETOROLAC TROMETHAMINE 15 MG/ML IJ SOLN: 15.0000 mg | Freq: Once | INTRAMUSCULAR | Status: DC

## 2023-07-11 MED ORDER — ALBUTEROL SULFATE (2.5 MG/3ML) 0.083% IN NEBU
2.5000 mg | INHALATION_SOLUTION | Freq: Three times a day (TID) | RESPIRATORY_TRACT | Status: AC
  Administered 2023-07-11 – 2023-07-12 (×2): 2.5 mg via RESPIRATORY_TRACT
  Filled 2023-07-11 (×2): qty 3

## 2023-07-11 MED ORDER — KETOROLAC TROMETHAMINE 15 MG/ML IJ SOLN
15.0000 mg | Freq: Once | INTRAMUSCULAR | Status: AC
  Administered 2023-07-11: 15 mg via INTRAVENOUS
  Filled 2023-07-11: qty 1

## 2023-07-11 MED ORDER — BUDESON-GLYCOPYRROL-FORMOTEROL 160-9-4.8 MCG/ACT IN AERO: 2.0000 | INHALATION_SPRAY | Freq: Two times a day (BID) | RESPIRATORY_TRACT | Status: DC

## 2023-07-11 MED ORDER — DEXAMETHASONE SODIUM PHOSPHATE 4 MG/ML IJ SOLN
4.0000 mg | Freq: Once | INTRAMUSCULAR | Status: AC
  Administered 2023-07-11: 4 mg via INTRAVENOUS
  Filled 2023-07-11: qty 1

## 2023-07-11 MED ORDER — ENOXAPARIN SODIUM 40 MG/0.4ML IJ SOSY
40.0000 mg | PREFILLED_SYRINGE | INTRAMUSCULAR | Status: DC
  Administered 2023-07-11 – 2023-07-16 (×5): 40 mg via SUBCUTANEOUS
  Filled 2023-07-11 (×6): qty 0.4

## 2023-07-11 NOTE — Assessment & Plan Note (Addendum)
 Left thyroid nodule identified which may be increasing going back to 2022. Please correlate with prior workup and if needed a repeat ultrasound when clinically appropriate to assess for change. Please correlate with any prior FNA results as an aggressive lesion is possible by imaging. -Last TSH 2023, normal, with thyroid ultrasound 2022 -recommendations for FNA, and recommendations for endocrinology consult by Dr. Maple Hudson, does not appear  FNA was done.   - Will repeat TSH, free T3 and T4

## 2023-07-11 NOTE — ED Triage Notes (Signed)
 Pt arrived via POV c/o persistent SOB, left chest pain, and recent being seen here for same. Pt reports worsening pneumonia symptoms and finishing his course of antibiotics.

## 2023-07-11 NOTE — ED Notes (Signed)
 Patient transported to X-ray

## 2023-07-11 NOTE — ED Notes (Addendum)
 Pt sitting up on stretcher. A&O x4 stated that lying down makes his breathing worse. 2L of oxygen was applied pt's sats were below 94%. Pt did state after oxygen was applied he felt a little better

## 2023-07-11 NOTE — Assessment & Plan Note (Addendum)
 Stable.  On Norvasc 5 mg as needed.

## 2023-07-11 NOTE — ED Notes (Addendum)
 Pt ambulated with pulse ox, oxygen stayed above 92% on RA.

## 2023-07-11 NOTE — ED Notes (Signed)
 Pt stated he feels pain in his left rib cage when he coughs or takes a deep breath.

## 2023-07-11 NOTE — Assessment & Plan Note (Addendum)
 Presenting with cough, congestion, faint expiratory wheezing s/p DuoNebs.  O2 sats 91% on room air, placed on 2 L.  Afebrile without leukocytosis.  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  Patient had recurrence of pneumonia in January.   - CTA chest with contrast-today shows improved but still significant residual opacities in bilateral lower lungs.  -Would expect significant improvement in lung findings since pneumonia in January, will treat as pneumonia for now with IV antibiotics ceftriaxone and doxycycline. (Allergy to azithromycin and penicillins) -Albuterol nebs as needed and scheduled - Mucolytics -Dexamethasone 4 mg given, continue with prednisone 40mg  daily ( Solumedrol allergy). -Check procalcitonin -Swallow eval by speech therapy eval -Follow-up as outpatient for mediastinal and hilar lymphadenopathy -Check COVID, influenza, RSV -CT suggests emphysema, patient never smoked, continue Ensifentrine

## 2023-07-11 NOTE — ED Provider Notes (Signed)
 [place] EMERGENCY DEPARTMENT AT [facility] Provider Note   CSN: [id number] Arrival date & time: 07/11/23  1116     History  Chief Complaint  Patient presents with   Shortness of Breath    Drew Sandoval is a 48 y.o. male with a history of asthma and hypertension who presents the ED today for shortness of breath.  Patient reports that he was admitted to the hospital for pneumonia on 1/25 and states that his symptoms were getting better until about mid February.  Since then, he states that he has been feeling increasingly more short of breath, worse with ambulation.  He has been using his inhaler and nebulizer without improvement of symptoms.  Additionally, he states that has tenderness to the left side of his chest, which she had when he was initially diagnosed with pneumonia.  States the pain is worse with deep breaths and with movement.  States because of this he is not able to take in full, deep breaths.  Denies associated fevers, coughs, nausea, or vomiting.  No additional complaints or concerns at this time.    Home Medications Prior to Admission medications   Medication Sig Start Date End Date Taking? Authorizing Provider  acetaminophen (TYLENOL) 500 MG tablet Take 1,000 mg by mouth every 6 (six) hours as needed for moderate pain.    [provider]  albuterol (VENTOLIN HFA) 108 (90 Base) MCG/ACT inhaler Inhale 2 puffs into the lungs every 6 (six) hours as needed for wheezing or shortness of breath. 06/06/23   Sherryll Burger, Pratik D, DO  amLODipine (NORVASC) 5 MG tablet TAKE 1 TABLET (5 MG TOTAL) BY MOUTH DAILY. Patient taking differently: Take 5 mg by mouth as needed (high blood pressure). 02/12/22   Marinus Maw, MD  Budeson-Glycopyrrol-Formoterol (BREZTRI AEROSPHERE) 160-9-4.8 MCG/ACT AERO Inhale 2 puffs into the lungs 2 (two) times daily. Rinse mouth 12/24/22   Glenford Bayley, NP  Ensifentrine [facility]) 3 MG/2.5ML SUSP Inhale 2.5 mLs into the lungs 2 (two)  times daily. 03/28/23   Jetty Duhamel D, MD  EPINEPHrine 0.3 mg/0.3 mL IJ SOAJ injection Inject into thigh for severe allergic reaction 09/28/22   Jetty Duhamel D, MD  famotidine (PEPCID) 20 MG tablet One after supper 06/23/23   Nyoka Cowden, MD  levalbuterol (XOPENEX) 0.31 MG/3ML nebulizer solution Take 3 mLs (0.31 mg total) by nebulization every 4 (four) hours as needed for wheezing. 09/24/22   Kommor, Madison, MD  lidocaine (LIDODERM) 5 % Place 1 patch onto the skin daily. Remove & Discard patch within 12 hours or as directed by MD 06/07/23   Sherryll Burger, Pratik D, DO  montelukast (SINGULAIR) 10 MG tablet Take 1 tablet (10 mg total) by mouth at bedtime. Patient taking differently: Take 10 mg by mouth daily. 12/24/22   Glenford Bayley, NP  oxyCODONE (ROXICODONE) 5 MG immediate release tablet Take 1 tablet (5 mg total) by mouth every 4 (four) hours as needed for severe pain (pain score 7-10). 06/07/23   [name], MD  pantoprazole (PROTONIX) 40 MG tablet Take 1 tablet (40 mg total) by mouth daily. Take 30-60 min before first meal of the day 06/23/23   Nyoka Cowden, MD  predniSONE (DELTASONE) 10 MG tablet 2  each am until better then 1 daily x 5 days and stop 06/23/23   Nyoka Cowden, MD  primidone (MYSOLINE) 50 MG tablet TAKE 4 TABLETS TWICE A DAY Patient taking differently: Take 200 mg by mouth 2 (two) times daily.  03/07/23   Tat, Octaviano Batty, DO      Allergies    Azithromycin, Banana, Solu-medrol [methylprednisolone], Alvesco [ciclesonide], Anoro ellipta [umeclidinium-vilanterol], and Penicillins    Review of Systems   Review of Systems  Respiratory:  Positive for shortness of breath.   All other systems reviewed and are negative.   Physical Exam Updated Vital Signs BP (!) 162/90   Pulse 61   Temp 98.4 F (36.9 C) (Oral)   Resp 18   Ht 6\' 3"  (1.905 m)   Wt 79.4 kg   SpO2 95%   BMI 21.87 kg/m  Physical Exam Vitals and nursing note reviewed.  Constitutional:      Appearance:  Normal appearance.  HENT:     Head: Normocephalic and atraumatic.     Mouth/Throat:     Mouth: Mucous membranes are moist.  Eyes:     Conjunctiva/sclera: Conjunctivae normal.     Pupils: Pupils are equal, round, and reactive to light.  Cardiovascular:     Rate and Rhythm: Normal rate and regular rhythm.     Pulses: Normal pulses.     Heart sounds: Normal heart sounds.  Pulmonary:     Effort: Pulmonary effort is normal.     Breath sounds: Wheezing present.     Comments: Wheezing noted throughout with tenderness to the left lower anterior chest. On 2L nasal cannula Chest:     Chest wall: Tenderness present.  Abdominal:     Palpations: Abdomen is soft.     Tenderness: There is no abdominal tenderness.  Skin:    General: Skin is warm and dry.     Findings: No rash.  Neurological:     General: No focal deficit present.     Mental Status: He is alert.  Psychiatric:        Mood and Affect: Mood normal.        Behavior: Behavior normal.    ED Results / Procedures / Treatments   Labs (all labs ordered are listed, but only abnormal results are displayed) Labs Reviewed  CBC WITH DIFFERENTIAL/PLATELET - Abnormal; Notable for the following components:      Result Value   RDW 15.9 (*)    All other components within normal limits  COMPREHENSIVE METABOLIC PANEL - Abnormal; Notable for the following components:   Glucose, Bld 114 (*)    Calcium 8.5 (*)    Total Protein 8.2 (*)    Albumin 3.2 (*)    AST 14 (*)    All other components within normal limits  BRAIN NATRIURETIC PEPTIDE  TROPONIN I (HIGH SENSITIVITY)    EKG None  Radiology CT Chest W Contrast Result Date: 07/11/2023 CLINICAL DATA:  Abnormal x-ray. EXAM: CT CHEST WITH CONTRAST TECHNIQUE: Multidetector CT imaging of the chest was performed during intravenous contrast administration. RADIATION DOSE REDUCTION: This exam was performed according to the departmental dose-optimization program which includes automated exposure  control, adjustment of the mA and/or kV according to patient size and/or use of iterative reconstruction technique. CONTRAST:  75mL OMNIPAQUE IOHEXOL 300 MG/ML  SOLN COMPARISON:  Chest x-ray earlier 07/11/2023.  Chest CT 04/20/2023. FINDINGS: Cardiovascular: Thoracic aorta is normal course and caliber with slight atherosclerotic calcified plaque. Heart is nonenlarged. No pericardial effusion. Mediastinum/Nodes: Slightly patulous thoracic esophagus. Left-sided thyroid nodule once again seen measuring 3.8 cm. Please correlate for any prior workup. This may be increasing. Please correlate with prior thyroid ultrasound from 11/25/2020. Prominent right axillary node today measures 9 mm in short axis. Previously same  node measured 6 mm. Also slight increasing in small left-sided axillary nodes. There are some enlarged hilar nodes. Example on the right measures 15 by 12 mm. Previously 16 by 12 mm. Small left hilar nodes. There are some prominent mediastinal nodes identified which are similar to previous. Lungs/Pleura: Scattered centrilobular and paraseptal emphysematous change. No frank consolidation, pneumothorax or effusion. Previously there were areas of ground-glass interstitial changes along the anterior inferior left upper lobe and right upper lobe. These areas are slightly improved from previous. There also areas previously in the middle lobe, lingula and lower lobes which have shown significant improvement as well but there is significant residual particularly along the lower lobes and inferior middle lobe and lingula. Some of the areas of opacity are somewhat nodular, for example right lower lobe medially measuring 8 mm on series 3, image 107. Previously 9 mm. Other areas are also similar. Upper Abdomen: Adrenal glands are preserved in the upper abdomen. Stable benign-appearing right-sided renal cysts. Musculoskeletal: Healing left-sided rib fractures are identified of the seventh and ninth ribs. These were not  seen on the prior CT. Please correlate with history. Stable sclerotic lesion along the right second rib, possible bone island. IMPRESSION: Previous areas of interstitial, ground-glass and consolidative lung opacities are intervally improved from November 2024 but there is significant residual particularly along the lower lung zones. There are also some stable areas of noncalcified lung nodularity. Recommend continued surveillance in 3 months. Few lymph nodes that are enlarged in the mediastinum and hilum are similar. There are some small not pathologic nodes identified in the axillary regions but are slightly larger and again attention on follow-up. Left thyroid nodule identified which may be increasing going back to 2022. Please correlate with prior workup and if needed a repeat ultrasound when clinically appropriate to assess for change. Please correlate with any prior FNA results as an aggressive lesion is possible by imaging. Emphysema (ICD10-J43.9). Electronically Signed   By: Karen Kays M.D.   On: 07/11/2023 16:40   DG Chest 2 View Result Date: 07/11/2023 CLINICAL DATA:  Chest pain and shortness of breath. EXAM: CHEST - 2 VIEW COMPARISON:  Chest radiograph dated 05/30/2023. FINDINGS: The heart size and mediastinal contours are within normal limits. Decreased bibasilar opacities with mild right-greater-than-left hazy basilar opacities. No new focal consolidation, pleural effusion or pneumothorax. No acute osseous abnormality. IMPRESSION: Mild right-greater-than-left hazy basilar opacities, significantly decreased since the prior exam. No new focal consolidation. Electronically Signed   By: Hart Robinsons M.D.   On: 07/11/2023 13:35    Procedures Procedures: not indicated.   Medications Ordered in ED Medications  albuterol (VENTOLIN HFA) 108 (90 Base) MCG/ACT inhaler 2 puff (2 puffs Inhalation Given 07/11/23 1223)  dexamethasone (DECADRON) injection 4 mg (4 mg Intravenous Given 07/11/23 1339)   ipratropium-albuterol (DUONEB) 0.5-2.5 (3) MG/3ML nebulizer solution 3 mL (3 mLs Nebulization Given 07/11/23 1348)  iohexol (OMNIPAQUE) 300 MG/ML solution 75 mL (75 mLs Intravenous Contrast Given 07/11/23 1426)  ipratropium-albuterol (DUONEB) 0.5-2.5 (3) MG/3ML nebulizer solution 3 mL (3 mLs Nebulization Given 07/11/23 1527)  ketorolac (TORADOL) 15 MG/ML injection 15 mg (15 mg Intravenous Given 07/11/23 1612)    ED Course/ Medical Decision Making/ A&P                                 Medical Decision Making Amount and/or Complexity of Data Reviewed Labs: ordered. Radiology: ordered.  Risk Prescription drug management. Decision regarding  hospitalization.   This patient presents to the ED for concern of shortness of breath, this involves an extensive number of treatment options, and is a complaint that carries with it a high risk of complications and morbidity.   Differential diagnosis includes: ACS, pneumonia, flu, COVID, RSV, other viral illness, asthma exacerbation, etc.   Comorbidities  See HPI above   Additional History  Additional history obtained from prior records   Lab Tests  I ordered and personally interpreted labs.  The pertinent results include:   Troponin of 4 CMP and CBC are within normal limits BNP is 53.0   Imaging Studies  I ordered imaging studies including CXR and CT chest  I independently visualized and interpreted imaging which showed:  Mild right > left hazy basilar opacities, significantly decreased since prior exam.  No new focal consolidation on CXR CT showed improved interstitial, groundglass and consolidative lung opacities since November 2024 but there is still significant residual circularly along the lower left lung zones.  Recommend 43-month surveillance. I agree with the radiologist interpretation   Consultations  I requested consultation with Dr. Mariea Clonts with [facility],  and discussed lab and imaging findings as well as pertinent plan - they  recommend: Admission for further evaluation and management of symptoms.   Problem List / ED Course / Critical Interventions / Medication Management  Patient presents the ED with worsening shortness of breath for the past several weeks.  Was diagnosed with pneumonia and admitted to the hospital for it on 1/25.  States symptoms improved after that but started to worsen mid February.  Has a history of asthma, for which he has been using his inhaler and nebulizer without improvement of symptoms. On arrival in the ED, oxygen was less than 94%, placed on 2 L nasal cannula.  O2 saturation of 96% on my initial evaluation with a supplemental O2. I ordered medications including: Decadron and DuoNeb for wheezing and shortness of breath  Reevaluation of the patient after these medicines showed that the patient improved.  Wheezing in upper lobes on auscultation otherwise improved.  Second DuoNeb ordered. Wheezing still present on reevaluation.  Patient's oxygen saturation remained about 92% on room air with ambulation. Reports feeling increased shortness of breath with exertion.   Social Determinants of Health  History of tobacco use   Test / Admission - Considered  Discussed findings with patient and family at bedside.  They are agreeable with plan for admission.       Final Clinical Impression(s) / ED Diagnoses Final diagnoses:  Severe asthma with exacerbation, unspecified whether persistent    Rx / DC Orders ED Discharge Orders     None         Maxwell Marion, PA-C 07/11/23 2037    Terrilee Files, MD 07/12/23 1816

## 2023-07-11 NOTE — H&P (Addendum)
 History and Physical    Drew Sandoval:[id number] DOB: 07/30/1975 DOA: 07/11/2023  PCP: Pcp, No   Patient coming from: Home  I have personally briefly reviewed patient's old medical records in [facility]  Chief Complaint: Difficulty breathing  HPI: Drew Sandoval is a 48 y.o. male with medical history significant for asthma, hypertension.  Patient presented to the ED with complaints of difficulty breathing with associated productive cough.  Symptoms have been ongoing and progressing over the past 2 to 3 weeks.  Reports symptoms started after he completed his last dose of antibiotics at home.  Reports chronic left-sided chest pain that is nonradiating.  Reports poor oral intake, no vomiting or diarrhea.  Does not smoke cigarettes.  Recent hospitalization 1/25 to 1/27 for acute hypoxic respiratory failure secondary to pneumonia and asthma exacerbation.  Was treated with Levaquin was continued on discharge, but tolerated an initial dose of cefepime in the ED.  He followed up with pulmonologist Dr. Sherene Sires on discharge as recommended.  ED Course: Tmax 98.6.  Heart rate 60s to 70s.  Respiratory rate 15-20.  Blood pressure systolic 140s to 956O.  O2 sats 91% on room air, placed on 2 L. WBC 6.6.  Troponin 4. Dexamethasone 4 mg given.  DuoNebs. CT chest with contrast-previous areas of interstitial groundglass and consolidative lung opacities have improved from November 2024, but there is significant residual particularly in lower lung zones.  Mediastinal and hilar lymph nodes similar to prior follow-up recommended.  Increasing left thyroid nodule. (Pls see detailed report).   Review of Systems: As per HPI all other systems reviewed and negative.  Past Medical History:  Diagnosis Date   Acute conjunctivitis of right eye 10/07/2014   Acute respiratory failure with hypoxia (HCC) 07/23/2013   Asthma    Asthma    Phreesia 06/26/2020   Asthma, severe persistent 07/16/2010   PFT >> spiro  2012 with FEV1 1.14 (28%), ratio 45 Alpha 1 (08/12/10) >>MM ([facility] [facility])  IGE 1747, positive allergy profile >> Elevated eos on CBC S/p intubation 10/2010 Job exposure to respiratory irritant - bleach, chicken Allergy vaccine begun 11/09/2010. DC'd by pt 2013- resumed 2013> 1:50 GH; DC'd -[facility] 9/24-9/27/ 2013- Status asthma, no vent. [facility] 3/ 2015- status ast   COVID-19 virus infection 06/17/2020   January, 2022. 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 His smoking use included cigarettes. He started smoking about 24 years ago. He has a 5.8 pack-year smoking history. He has quit using smokeless tobacco. He reports current alcohol use. He reports that he does not use drugs.  Allergies  Allergen Reactions   Azithromycin Rash   Banana Swelling    Throat swelling    Solu-Medrol [Methylprednisolone] Other (See Comments)    States his whole body itches  Alvesco [Ciclesonide] Rash    Possible reaction- rash   Anoro Ellipta [Umeclidinium-Vilanterol] Rash    Possible- rash   Penicillins Rash    Family History  Problem Relation Age of Onset   Lung cancer Mother    Emphysema Father    Arthritis Sister    Diabetes Maternal Grandmother    Thyroid disease Neg Hx    Prior to Admission medications   Medication Sig Start Date  End Date Taking? Authorizing Provider  acetaminophen (TYLENOL) 500 MG tablet Take 1,000 mg by mouth every 6 (six) hours as needed for moderate pain.    [provider]  albuterol (VENTOLIN HFA) 108 (90 Base) MCG/ACT inhaler Inhale 2 puffs into the lungs every 6 (six) hours as needed for wheezing or shortness of breath. 06/06/23   Sherryll Burger, Pratik D, DO  amLODipine (NORVASC) 5 MG tablet TAKE 1 TABLET (5 MG TOTAL) BY MOUTH DAILY. Patient taking differently: Take 5 mg by mouth as needed (high blood pressure). 02/12/22   Marinus Maw, MD  Budeson-Glycopyrrol-Formoterol (BREZTRI AEROSPHERE) 160-9-4.8 MCG/ACT AERO Inhale 2 puffs into the lungs 2 (two) times daily. Rinse mouth 12/24/22   Glenford Bayley, NP  Ensifentrine [facility]) 3 MG/2.5ML SUSP Inhale 2.5 mLs into the lungs 2 (two) times daily. 03/28/23   Jetty Duhamel D, MD  EPINEPHrine 0.3 mg/0.3 mL IJ SOAJ injection Inject into thigh for severe allergic reaction 09/28/22   Jetty Duhamel D, MD  famotidine (PEPCID) 20 MG tablet One after supper 06/23/23   Nyoka Cowden, MD  levalbuterol (XOPENEX) 0.31 MG/3ML nebulizer solution Take 3 mLs (0.31 mg total) by nebulization every 4 (four) hours as needed for wheezing. 09/24/22   Kommor, Madison, MD  lidocaine (LIDODERM) 5 % Place 1 patch onto the skin daily. Remove & Discard patch within 12 hours or as directed by MD 06/07/23   Sherryll Burger, Pratik D, DO  montelukast (SINGULAIR) 10 MG tablet Take 1 tablet (10 mg total) by mouth at bedtime. Patient taking differently: Take 10 mg by mouth daily. 12/24/22   Glenford Bayley, NP  oxyCODONE (ROXICODONE) 5 MG immediate release tablet Take 1 tablet (5 mg total) by mouth every 4 (four) hours as needed for severe pain (pain score 7-10). 06/07/23   [name], MD  pantoprazole (PROTONIX) 40 MG tablet Take 1 tablet (40 mg total) by mouth daily. Take 30-60 min before first meal of the day 06/23/23   Nyoka Cowden, MD  predniSONE (DELTASONE) 10 MG tablet 2  each am  until better then 1 daily x 5 days and stop 06/23/23   Nyoka Cowden, MD  primidone (MYSOLINE) 50 MG tablet TAKE 4 TABLETS TWICE A DAY Patient taking differently: Take 200 mg by mouth 2 (two) times daily. 03/07/23   Vladimir Faster, DO    Physical Exam: Vitals:   07/11/23 1310 07/11/23 1415 07/11/23 1543 07/11/23 1655  BP:  (!) 162/90    Pulse: 70 67  61  Resp: 20 19  18   Temp:   98.4 F (36.9 C)   TempSrc:   Oral   SpO2: 94% 93%  95%  Weight:      Height:        Constitutional: NAD, calm, comfortable Vitals:   07/11/23 1310 07/11/23 1415 07/11/23 1543 07/11/23 1655  BP:  (!) 162/90    Pulse: 70 67  61  Resp: 20 19  18   Temp:   98.4 F (36.9 C)   TempSrc:   Oral  SpO2: 94% 93%  95%  Weight:      Height:       Eyes: PERRL, lids and conjunctivae normal ENMT: Mucous membranes are moist.   Neck: normal, supple, no masses, no thyromegaly Respiratory: Faint expiratory wheezing, , no crackles. Normal respiratory effort. No accessory muscle use.  Cardiovascular: Regular rate and rhythm, no murmurs / rubs / gallops. No extremity edema.  Abdomen: no tenderness, no masses palpated. No hepatosplenomegaly. Bowel sounds positive.  Musculoskeletal: no clubbing / cyanosis. No joint deformity upper and lower extremities.  Skin: no rashes, lesions, ulcers. No induration Neurologic: No facial asymmetry, moving extremities spontaneously, speech fluent.  Psychiatric: Normal judgment and insight. Alert and oriented x 3. Normal mood.   Labs on Admission: I have personally reviewed following labs and imaging studies  CBC: Recent Labs  Lab 07/11/23 1236  WBC 6.6  NEUTROABS 4.7  HGB 13.3  HCT 41.0  MCV 93.2  PLT 244   Basic Metabolic Panel: Recent Labs  Lab 07/11/23 1236  NA 139  K 3.9  CL 109  CO2 22  GLUCOSE 114*  BUN 15  CREATININE 0.73  CALCIUM 8.5*   GFR: Estimated Creatinine Clearance: 128.2 mL/min (by C-G formula based on SCr of 0.73 mg/dL). Liver Function  Tests: Recent Labs  Lab 07/11/23 1236  AST 14*  ALT 19  ALKPHOS 62  BILITOT 0.4  PROT 8.2*  ALBUMIN 3.2*   Urine analysis:    Component Value Date/Time   COLORURINE BROWN (A) 06/13/2021 0001   APPEARANCEUR CLEAR 06/13/2021 0001   LABSPEC >1.030 (H) 06/13/2021 0001   PHURINE 5.5 06/13/2021 0001   GLUCOSEU NEGATIVE 06/13/2021 0001   HGBUR LARGE (A) 06/13/2021 0001   BILIRUBINUR SMALL (A) 06/13/2021 0001   BILIRUBINUR negative 06/11/2021 0844   KETONESUR NEGATIVE 06/13/2021 0001   PROTEINUR 30 (A) 06/13/2021 0001   UROBILINOGEN 0.2 06/11/2021 0844   UROBILINOGEN 0.2 06/24/2011 0858   NITRITE NEGATIVE 06/13/2021 0001   LEUKOCYTESUR NEGATIVE 06/13/2021 0001    Radiological Exams on Admission: CT Chest W Contrast Result Date: 07/11/2023 CLINICAL DATA:  Abnormal x-ray. EXAM: CT CHEST WITH CONTRAST TECHNIQUE: Multidetector CT imaging of the chest was performed during intravenous contrast administration. RADIATION DOSE REDUCTION: This exam was performed according to the departmental dose-optimization program which includes automated exposure control, adjustment of the mA and/or kV according to patient size and/or use of iterative reconstruction technique. CONTRAST:  75mL OMNIPAQUE IOHEXOL 300 MG/ML  SOLN COMPARISON:  Chest x-ray earlier 07/11/2023.  Chest CT 04/20/2023. FINDINGS: Cardiovascular: Thoracic aorta is normal course and caliber with slight atherosclerotic calcified plaque. Heart is nonenlarged. No pericardial effusion. Mediastinum/Nodes: Slightly patulous thoracic esophagus. Left-sided thyroid nodule once again seen measuring 3.8 cm. Please correlate for any prior workup. This may be increasing. Please correlate with prior thyroid ultrasound from 11/25/2020. Prominent right axillary node today measures 9 mm in short axis. Previously same node measured 6 mm. Also slight increasing in small left-sided axillary nodes. There are some enlarged hilar nodes. Example on the right measures 15  by 12 mm. Previously 16 by 12 mm. Small left hilar nodes. There are some prominent mediastinal nodes identified which are similar to previous. Lungs/Pleura: Scattered centrilobular and paraseptal emphysematous change. No frank consolidation, pneumothorax or effusion. Previously there were areas of ground-glass interstitial changes along the anterior inferior left upper lobe and right upper lobe. These areas are slightly improved from previous. There also areas previously in the middle lobe, lingula and lower lobes which have shown  significant improvement as well but there is significant residual particularly along the lower lobes and inferior middle lobe and lingula. Some of the areas of opacity are somewhat nodular, for example right lower lobe medially measuring 8 mm on series 3, image 107. Previously 9 mm. Other areas are also similar. Upper Abdomen: Adrenal glands are preserved in the upper abdomen. Stable benign-appearing right-sided renal cysts. Musculoskeletal: Healing left-sided rib fractures are identified of the seventh and ninth ribs. These were not seen on the prior CT. Please correlate with history. Stable sclerotic lesion along the right second rib, possible bone island. IMPRESSION: Previous areas of interstitial, ground-glass and consolidative lung opacities are intervally improved from November 2024 but there is significant residual particularly along the lower lung zones. There are also some stable areas of noncalcified lung nodularity. Recommend continued surveillance in 3 months. Few lymph nodes that are enlarged in the mediastinum and hilum are similar. There are some small not pathologic nodes identified in the axillary regions but are slightly larger and again attention on follow-up. Left thyroid nodule identified which may be increasing going back to 2022. Please correlate with prior workup and if needed a repeat ultrasound when clinically appropriate to assess for change. Please correlate  with any prior FNA results as an aggressive lesion is possible by imaging. Emphysema (ICD10-J43.9). Electronically Signed   By: Karen Kays M.D.   On: 07/11/2023 16:40   DG Chest 2 View Result Date: 07/11/2023 CLINICAL DATA:  Chest pain and shortness of breath. EXAM: CHEST - 2 VIEW COMPARISON:  Chest radiograph dated 05/30/2023. FINDINGS: The heart size and mediastinal contours are within normal limits. Decreased bibasilar opacities with mild right-greater-than-left hazy basilar opacities. No new focal consolidation, pleural effusion or pneumothorax. No acute osseous abnormality. IMPRESSION: Mild right-greater-than-left hazy basilar opacities, significantly decreased since the prior exam. No new focal consolidation. Electronically Signed   By: Hart Robinsons M.D.   On: 07/11/2023 13:35    EKG: Independently reviewed.  Pending.  Assessment/Plan Principal Problem:   Asthma exacerbation Active Problems:   Left thyroid nodule   Hyper-IgE syndrome (HCC)   Hypertension, essential   Assessment and Plan: * Asthma exacerbation Presenting with cough, congestion, faint expiratory wheezing s/p DuoNebs.  O2 sats 91% on room air, placed on 2 L.  Afebrile without leukocytosis.  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  Patient had recurrence of pneumonia in January.   - CTA chest with contrast-today shows improved but still significant residual opacities in bilateral lower lungs.  -Would expect significant improvement in lung findings since pneumonia in January, will treat as pneumonia for now with IV antibiotics ceftriaxone and doxycycline. (Allergy to azithromycin and penicillins) -Albuterol nebs as needed and scheduled - Mucolytics -Dexamethasone 4 mg given, continue with prednisone 40mg  daily ( Solumedrol allergy). -Check procalcitonin -Swallow eval by speech therapy eval -Follow-up as outpatient for mediastinal and hilar lymphadenopathy -Check COVID, influenza, RSV -CT  suggests emphysema, patient never smoked, continue Ensifentrine   Left thyroid nodule Left thyroid nodule identified which may be increasing going back to 2022. Please correlate with prior workup and if needed a repeat ultrasound when clinically appropriate to assess for change. Please correlate with any prior FNA results as an aggressive lesion is possible by imaging. -Last TSH 2023, normal, with thyroid ultrasound 2022 -recommendations for FNA, and recommendations for endocrinology consult by Dr. Maple Hudson, does not appear  FNA was done.   - Will repeat TSH, free T3 and T4  Hypertension, essential  Stable.  On Norvasc 5 mg as needed.   DVT prophylaxis: Lovenox Code Status: FULL code Family Communication: Significant other at bedside.- [place]. Disposition Plan: ~ 2 days Consults called: None Admission status: Obs tele I certify that at the point of admission it is my clinical judgment that the patient will require inpatient hospital care spanning beyond 2 midnights from the point of admission due to high intensity of service, high risk for further deterioration and high frequency of surveillance required.  Author: Onnie Boer, MD 07/11/2023 7:30 PM  For on call review [email or web address].

## 2023-07-11 NOTE — ED Notes (Addendum)
 Pt ambulated to restroom stating he is still feeling SOB.

## 2023-07-12 ENCOUNTER — Encounter (HOSPITAL_COMMUNITY): Payer: Self-pay | Admitting: Internal Medicine

## 2023-07-12 ENCOUNTER — Observation Stay (HOSPITAL_COMMUNITY)

## 2023-07-12 DIAGNOSIS — D824 Hyperimmunoglobulin E [IgE] syndrome: Secondary | ICD-10-CM | POA: Diagnosis not present

## 2023-07-12 DIAGNOSIS — Z87891 Personal history of nicotine dependence: Secondary | ICD-10-CM

## 2023-07-12 DIAGNOSIS — I10 Essential (primary) hypertension: Secondary | ICD-10-CM | POA: Diagnosis not present

## 2023-07-12 DIAGNOSIS — E042 Nontoxic multinodular goiter: Secondary | ICD-10-CM | POA: Diagnosis not present

## 2023-07-12 DIAGNOSIS — J45901 Unspecified asthma with (acute) exacerbation: Secondary | ICD-10-CM | POA: Diagnosis not present

## 2023-07-12 DIAGNOSIS — E041 Nontoxic single thyroid nodule: Secondary | ICD-10-CM | POA: Diagnosis not present

## 2023-07-12 LAB — CBC
HCT: 38.5 % — ABNORMAL LOW (ref 39.0–52.0)
Hemoglobin: 12.6 g/dL — ABNORMAL LOW (ref 13.0–17.0)
MCH: 30.5 pg (ref 26.0–34.0)
MCHC: 32.7 g/dL (ref 30.0–36.0)
MCV: 93.2 fL (ref 80.0–100.0)
Platelets: 223 10*3/uL (ref 150–400)
RBC: 4.13 MIL/uL — ABNORMAL LOW (ref 4.22–5.81)
RDW: 15.9 % — ABNORMAL HIGH (ref 11.5–15.5)
WBC: 11.5 10*3/uL — ABNORMAL HIGH (ref 4.0–10.5)
nRBC: 0 % (ref 0.0–0.2)

## 2023-07-12 MED ORDER — BUDESONIDE 0.25 MG/2ML IN SUSP
0.2500 mg | Freq: Two times a day (BID) | RESPIRATORY_TRACT | Status: DC
  Administered 2023-07-12 – 2023-07-13 (×2): 0.25 mg via RESPIRATORY_TRACT
  Filled 2023-07-12 (×2): qty 2

## 2023-07-12 MED ORDER — ARFORMOTEROL TARTRATE 15 MCG/2ML IN NEBU
15.0000 ug | INHALATION_SOLUTION | Freq: Two times a day (BID) | RESPIRATORY_TRACT | Status: DC
  Administered 2023-07-12 – 2023-07-17 (×10): 15 ug via RESPIRATORY_TRACT
  Filled 2023-07-12 (×10): qty 2

## 2023-07-12 MED ORDER — IPRATROPIUM-ALBUTEROL 0.5-2.5 (3) MG/3ML IN SOLN
3.0000 mL | Freq: Four times a day (QID) | RESPIRATORY_TRACT | Status: DC
  Administered 2023-07-12 – 2023-07-14 (×9): 3 mL via RESPIRATORY_TRACT
  Filled 2023-07-12 (×9): qty 3

## 2023-07-12 MED ORDER — AMLODIPINE BESYLATE 5 MG PO TABS
5.0000 mg | ORAL_TABLET | Freq: Every day | ORAL | Status: DC
  Administered 2023-07-12 – 2023-07-17 (×6): 5 mg via ORAL
  Filled 2023-07-12 (×6): qty 1

## 2023-07-12 MED ORDER — GUAIFENESIN ER 600 MG PO TB12
1200.0000 mg | ORAL_TABLET | Freq: Two times a day (BID) | ORAL | Status: DC
  Administered 2023-07-12 – 2023-07-17 (×11): 1200 mg via ORAL
  Filled 2023-07-12 (×11): qty 2

## 2023-07-12 MED ORDER — DEXAMETHASONE SODIUM PHOSPHATE 10 MG/ML IJ SOLN
8.0000 mg | Freq: Two times a day (BID) | INTRAMUSCULAR | Status: DC
  Administered 2023-07-12 – 2023-07-17 (×11): 8 mg via INTRAVENOUS
  Filled 2023-07-12 (×11): qty 1

## 2023-07-12 MED ORDER — ALBUTEROL SULFATE (2.5 MG/3ML) 0.083% IN NEBU: 2.5000 mg | INHALATION_SOLUTION | RESPIRATORY_TRACT | Status: DC | PRN

## 2023-07-12 MED ORDER — BUDESON-GLYCOPYRROL-FORMOTEROL 160-9-4.8 MCG/ACT IN AERO: 2.0000 | INHALATION_SPRAY | Freq: Two times a day (BID) | RESPIRATORY_TRACT | Status: DC

## 2023-07-12 NOTE — Plan of Care (Signed)

## 2023-07-12 NOTE — Progress Notes (Signed)
 PROGRESS NOTE   Drew Sandoval  VHQ:[id number] DOB: 1975-06-16 DOA: 07/11/2023 PCP: Pcp, No   Chief Complaint  Patient presents with   Shortness of Breath   Level of care: Telemetry  Brief Admission History:  48 y.o. male former smoker with medical history significant for severe asthma and hypertension.  He started smoking at age 45.  Patient presented to the ED with complaints of difficulty breathing with associated productive cough.  Symptoms have been ongoing and progressing over the past 2 to 3 weeks.  Reports symptoms started after he completed his last dose of antibiotics at home.  Reports chronic left-sided chest pain that is nonradiating.  Reports poor oral intake, no vomiting or diarrhea.  Does not smoke cigarettes anymore.   Recent hospitalization 1/25 to 1/27 for acute hypoxic respiratory failure secondary to pneumonia and asthma exacerbation.  Was treated with Levaquin was continued on discharge, but tolerated an initial dose of cefepime in the ED.  He followed up with pulmonologist Dr. Sherene Sires on discharge as recommended.   ED Course: Tmax 98.6.  Heart rate 60s to 70s.  Respiratory rate 15-20.  Blood pressure systolic 140s to 413K.  O2 sats 91% on room air, placed on 2 L.  WBC 6.6.  Troponin 4. Dexamethasone 4 mg given.  DuoNebs. CT chest with contrast-previous areas of interstitial groundglass and consolidative lung opacities have improved from November 2024, but there is significant residual particularly in lower lung zones.  Mediastinal and hilar lymph nodes similar to prior follow-up recommended.  Increasing left thyroid nodule found that will need further work up.  He was admitted with acute asthma exacerbation.   Assessment and Plan:  Severe Acute Asthma exacerbation    - CTA chest with contrast-shows improved but still significant residual opacities in bilateral lower lungs.  - DC antibiotics today given reassuring procalcitonin <0.10  - intensify bronchodilators: start  brovana/budesonide; continue duonebs, continue ensifentrine - Mucolytics mucinex 1200 mg BID added  - DC oral prednisone, start Dexamethasone 8 mg IV every 12 hours - Follow-up as outpatient for mediastinal and hilar lymphadenopathy - COVID, influenza, RSV testing negative   Left thyroid nodule (suspicious) Hyperthyroidism  Left thyroid nodule identified which may be increasing going back to 2022. Please correlate with prior workup and if needed a repeat ultrasound when clinically appropriate to assess for change. Please correlate with any prior FNA results as an aggressive lesion is possible by imaging. -Last TSH 2023, normal, with thyroid ultrasound 2022 -recommendations for FNA, and recommendations for endocrinology consult by Dr. Maple Hudson, does not appear FNA was done.   - TSH is suppressed today at 0.311 - free T3 and T4 still pending  - repeat thyroid US pending   Hypertension, essential Stable.  On Norvasc 5 mg as needed at home - with the elevated BP readings I am seeing here in hospital, I have ordered it to be given daily   DVT prophylaxis: enoxaparin  Code Status: Full  Family Communication:  Disposition:    Consultants:   Procedures:   Antimicrobials:    Subjective: Pt is having pretty severe SOB this morning.  We had to order him a STAT nebulizer treatment due to severe dyspnea.   Objective: Vitals:   07/11/23 1915 07/12/23 0522 07/12/23 0849 07/12/23 1412  BP: 137/79 (!) 152/88 (!) 149/105   Pulse: 65 75 71   Resp: 20 19 20    Temp: 98.4 F (36.9 C) 98.7 F (37.1 C) 98.3 F (36.8 C)   TempSrc: Oral Oral  Oral   SpO2: 95% 92% 97% 94%  Weight:      Height:       No intake or output data in the 24 hours ending 07/12/23 1524 Filed Weights   07/11/23 1144  Weight: 79.4 kg   Examination:  General exam: Appears moderately distressed. Speaking short sentences.  Respiratory system: poor air movement, diffuse wheezing bilateral Cardiovascular system: normal S1  & S2 heard. No JVD, murmurs, rubs, gallops or clicks. No pedal edema. Gastrointestinal system: Abdomen is nondistended, soft and nontender. No organomegaly or masses felt. Normal bowel sounds heard. Central nervous system: Alert and oriented. No focal neurological deficits. Extremities: Symmetric 5 x 5 power. Skin: No rashes, lesions or ulcers. Psychiatry: Judgement and insight appear normal. Mood & affect appropriate.   Data Reviewed: I have personally reviewed following labs and imaging studies  CBC: Recent Labs  Lab 07/11/23 1236 07/12/23 0333  WBC 6.6 11.5*  NEUTROABS 4.7  --   HGB 13.3 12.6*  HCT 41.0 38.5*  MCV 93.2 93.2  PLT 244 223    Basic Metabolic Panel: Recent Labs  Lab 07/11/23 1236  NA 139  K 3.9  CL 109  CO2 22  GLUCOSE 114*  BUN 15  CREATININE 0.73  CALCIUM 8.5*    CBG: No results for input(s): "GLUCAP" in the last 168 hours.  Recent Results (from the past 240 hours)  Resp panel by RT-PCR (RSV, Flu A&B, Covid) Anterior Nasal Swab     Status: None   Collection Time: 07/11/23  7:07 PM   Specimen: Anterior Nasal Swab  Result Value Ref Range Status   SARS Coronavirus 2 by RT PCR NEGATIVE NEGATIVE Final    Comment: (NOTE) SARS-CoV-2 target nucleic acids are NOT DETECTED.  The SARS-CoV-2 RNA is generally detectable in upper respiratory specimens during the acute phase of infection. The lowest concentration of SARS-CoV-2 viral copies this assay can detect is 138 copies/mL. A negative result does not preclude SARS-Cov-2 infection and should not be used as the sole basis for treatment or other patient management decisions. A negative result may occur with  improper specimen collection/handling, submission of specimen other than nasopharyngeal swab, presence of viral mutation(s) within the areas targeted by this assay, and inadequate number of viral copies(<138 copies/mL). A negative result must be combined with clinical observations, patient history,  and epidemiological information. The expected result is Negative.  Fact Sheet for Patients:  BloggerCourse.com  Fact Sheet for Healthcare Providers:  SeriousBroker.it  This test is no t yet approved or cleared by the [place] FDA and  has been authorized for detection and/or diagnosis of SARS-CoV-2 by FDA under an Emergency Use Authorization (EUA). This EUA will remain  in effect (meaning this test can be used) for the duration of the COVID-19 declaration under Section 564(b)(1) of the Act, 21 U.S.C.section 360bbb-3(b)(1), unless the authorization is terminated  or revoked sooner.       Influenza A by PCR NEGATIVE NEGATIVE Final   Influenza B by PCR NEGATIVE NEGATIVE Final    Comment: (NOTE) The Xpert Xpress SARS-CoV-2/FLU/RSV plus assay is intended as an aid in the diagnosis of influenza from Nasopharyngeal swab specimens and should not be used as a sole basis for treatment. Nasal washings and aspirates are unacceptable for Xpert Xpress SARS-CoV-2/FLU/RSV testing.  Fact Sheet for Patients: BloggerCourse.com  Fact Sheet for Healthcare Providers: SeriousBroker.it  This test is not yet approved or cleared by the [place] FDA and has been authorized for detection and/or  diagnosis of SARS-CoV-2 by FDA under an Emergency Use Authorization (EUA). This EUA will remain in effect (meaning this test can be used) for the duration of the COVID-19 declaration under Section 564(b)(1) of the Act, 21 U.S.C. section 360bbb-3(b)(1), unless the authorization is terminated or revoked.     Resp Syncytial Virus by PCR NEGATIVE NEGATIVE Final    Comment: (NOTE) Fact Sheet for Patients: BloggerCourse.com  Fact Sheet for Healthcare Providers: SeriousBroker.it  This test is not yet approved or cleared by the [place] FDA and has  been authorized for detection and/or diagnosis of SARS-CoV-2 by FDA under an Emergency Use Authorization (EUA). This EUA will remain in effect (meaning this test can be used) for the duration of the COVID-19 declaration under Section 564(b)(1) of the Act, 21 U.S.C. section 360bbb-3(b)(1), unless the authorization is terminated or revoked.  Performed at [facility], [address], [place], [place] [address]      Radiology Studies: CT Chest W Contrast Result Date: 07/11/2023 CLINICAL DATA:  Abnormal x-ray. EXAM: CT CHEST WITH CONTRAST TECHNIQUE: Multidetector CT imaging of the chest was performed during intravenous contrast administration. RADIATION DOSE REDUCTION: This exam was performed according to the departmental dose-optimization program which includes automated exposure control, adjustment of the mA and/or kV according to patient size and/or use of iterative reconstruction technique. CONTRAST:  75mL OMNIPAQUE IOHEXOL 300 MG/ML  SOLN COMPARISON:  Chest x-ray earlier 07/11/2023.  Chest CT 04/20/2023. FINDINGS: Cardiovascular: Thoracic aorta is normal course and caliber with slight atherosclerotic calcified plaque. Heart is nonenlarged. No pericardial effusion. Mediastinum/Nodes: Slightly patulous thoracic esophagus. Left-sided thyroid nodule once again seen measuring 3.8 cm. Please correlate for any prior workup. This may be increasing. Please correlate with prior thyroid ultrasound from 11/25/2020. Prominent right axillary node today measures 9 mm in short axis. Previously same node measured 6 mm. Also slight increasing in small left-sided axillary nodes. There are some enlarged hilar nodes. Example on the right measures 15 by 12 mm. Previously 16 by 12 mm. Small left hilar nodes. There are some prominent mediastinal nodes identified which are similar to previous. Lungs/Pleura: Scattered centrilobular and paraseptal emphysematous change. No frank consolidation, pneumothorax or effusion. Previously  there were areas of ground-glass interstitial changes along the anterior inferior left upper lobe and right upper lobe. These areas are slightly improved from previous. There also areas previously in the middle lobe, lingula and lower lobes which have shown significant improvement as well but there is significant residual particularly along the lower lobes and inferior middle lobe and lingula. Some of the areas of opacity are somewhat nodular, for example right lower lobe medially measuring 8 mm on series 3, image 107. Previously 9 mm. Other areas are also similar. Upper Abdomen: Adrenal glands are preserved in the upper abdomen. Stable benign-appearing right-sided renal cysts. Musculoskeletal: Healing left-sided rib fractures are identified of the seventh and ninth ribs. These were not seen on the prior CT. Please correlate with history. Stable sclerotic lesion along the right second rib, possible bone island. IMPRESSION: Previous areas of interstitial, ground-glass and consolidative lung opacities are intervally improved from November 2024 but there is significant residual particularly along the lower lung zones. There are also some stable areas of noncalcified lung nodularity. Recommend continued surveillance in 3 months. Few lymph nodes that are enlarged in the mediastinum and hilum are similar. There are some small not pathologic nodes identified in the axillary regions but are slightly larger and again attention on follow-up. Left thyroid nodule identified which may  be increasing going back to 2022. Please correlate with prior workup and if needed a repeat ultrasound when clinically appropriate to assess for change. Please correlate with any prior FNA results as an aggressive lesion is possible by imaging. Emphysema (ICD10-J43.9). Electronically Signed   By: Karen Kays M.D.   On: 07/11/2023 16:40   DG Chest 2 View Result Date: 07/11/2023 CLINICAL DATA:  Chest pain and shortness of breath. EXAM: CHEST - 2  VIEW COMPARISON:  Chest radiograph dated 05/30/2023. FINDINGS: The heart size and mediastinal contours are within normal limits. Decreased bibasilar opacities with mild right-greater-than-left hazy basilar opacities. No new focal consolidation, pleural effusion or pneumothorax. No acute osseous abnormality. IMPRESSION: Mild right-greater-than-left hazy basilar opacities, significantly decreased since the prior exam. No new focal consolidation. Electronically Signed   By: Hart Robinsons M.D.   On: 07/11/2023 13:35    Scheduled Meds:  arformoterol  15 mcg Nebulization BID   budesonide (PULMICORT) nebulizer solution  0.25 mg Nebulization BID   dexamethasone (DECADRON) injection  8 mg Intravenous Q12H   enoxaparin (LOVENOX) injection  40 mg Subcutaneous Q24H   Ensifentrine  2.5 mL Inhalation BID   guaiFENesin  1,200 mg Oral BID   ipratropium-albuterol  3 mL Nebulization Q6H   montelukast  10 mg Oral Daily   pantoprazole  40 mg Oral Daily   primidone  200 mg Oral BID   Continuous Infusions:   LOS: 0 days   Time spent: 55 mins  Jourden Gilson Laural Benes, MD How to contact the [facility] Attending or Consulting provider 7A - 7P or covering provider during after hours 7P -7A, for this patient?  Check the care team in [facility] and look for a) attending/consulting TRH provider listed and b) the [facility] team listed Log into [email or web address] to find provider on call.  Locate the [facility] provider you are looking for under [facility] and page to a number that you can be directly reached. If you still have difficulty reaching the provider, please page the [facility] (Director on Call) for the Hospitalists listed on amion for assistance.  07/12/2023, 3:24 PM

## 2023-07-12 NOTE — Plan of Care (Signed)

## 2023-07-12 NOTE — Progress Notes (Signed)
   07/12/23 1017  TOC Brief Assessment  Insurance and Status Reviewed  Patient has primary care physician Yes  Home environment has been reviewed from home  Prior level of function: independent  Prior/Current Home Services No current home services  Social Drivers of Health Review SDOH reviewed no interventions necessary  Readmission risk has been reviewed Yes  Transition of care needs no transition of care needs at this time    Reviewed pt's record and discussed pt's status with MD in progression rounds this AM. No immediate TOC needs identified. TOC will follow and assist if needs arise.

## 2023-07-12 NOTE — Progress Notes (Signed)
 SLP Cancellation Note  Patient Details Name: Alexsandro Salek MRN: [id number] DOB: 09-21-1975   Cancelled treatment:       Reason Eval/Treat Not Completed: Patient at procedure or test/unavailable (Pt at ultrasound. Fiance reports no difficulty swallowing. SLP will check back BSE.)  Thank you,  Havery Moros, CCC-SLP [phone]  Jennea Rager 07/12/2023, 1:32 PM

## 2023-07-12 NOTE — Hospital Course (Addendum)
 48 y.o. male former smoker with medical history significant for severe asthma and hypertension.  He started smoking at age 32.  Patient presented to the ED with complaints of difficulty breathing with associated productive cough.  Symptoms have been ongoing and progressing over the past 2 to 3 weeks.  Reports symptoms started after he completed his last dose of antibiotics at home.  Reports chronic left-sided chest pain that is nonradiating.  Reports poor oral intake, no vomiting or diarrhea.  Does not smoke cigarettes anymore.   Recent hospitalization 1/25 to 1/27 for acute hypoxic respiratory failure secondary to pneumonia and asthma exacerbation.  Was treated with Levaquin was continued on discharge, but tolerated an initial dose of cefepime in the ED.  He followed up with pulmonologist Dr. Sherene Sires on discharge as recommended.   ED Course: Tmax 98.6.  Heart rate 60s to 70s.  Respiratory rate 15-20.  Blood pressure systolic 140s to 213Y.  O2 sats 91% on room air, placed on 2 L.  WBC 6.6.  Troponin 4. Dexamethasone 4 mg given.  DuoNebs. CT chest with contrast-previous areas of interstitial groundglass and consolidative lung opacities have improved from November 2024, but there is significant residual particularly in lower lung zones.  Mediastinal and hilar lymph nodes similar to prior follow-up recommended.  Increasing left thyroid nodule found that will need further work up.  He was admitted with acute asthma exacerbation.

## 2023-07-12 NOTE — Progress Notes (Signed)
 Pt appear in the tripod position on edge of the bed. Shallow labored breathing observed. Wheezing auscultated in bilat lung fields. MD at bedside to observe pt as well. 02:91% on RA. Pt stated he has nonproductive cough. 3 liters of supplemental oxygen applied. Temp adjusted in the room. PRN breathing treatment given.

## 2023-07-13 DIAGNOSIS — J101 Influenza due to other identified influenza virus with other respiratory manifestations: Secondary | ICD-10-CM | POA: Diagnosis not present

## 2023-07-13 DIAGNOSIS — J45901 Unspecified asthma with (acute) exacerbation: Secondary | ICD-10-CM | POA: Diagnosis present

## 2023-07-13 DIAGNOSIS — G8929 Other chronic pain: Secondary | ICD-10-CM | POA: Diagnosis present

## 2023-07-13 DIAGNOSIS — Z881 Allergy status to other antibiotic agents status: Secondary | ICD-10-CM | POA: Diagnosis not present

## 2023-07-13 DIAGNOSIS — G250 Essential tremor: Secondary | ICD-10-CM | POA: Diagnosis present

## 2023-07-13 DIAGNOSIS — J1001 Influenza due to other identified influenza virus with the same other identified influenza virus pneumonia: Secondary | ICD-10-CM | POA: Diagnosis present

## 2023-07-13 DIAGNOSIS — J9601 Acute respiratory failure with hypoxia: Secondary | ICD-10-CM | POA: Diagnosis present

## 2023-07-13 DIAGNOSIS — Z8616 Personal history of COVID-19: Secondary | ICD-10-CM | POA: Diagnosis not present

## 2023-07-13 DIAGNOSIS — Z7951 Long term (current) use of inhaled steroids: Secondary | ICD-10-CM | POA: Diagnosis not present

## 2023-07-13 DIAGNOSIS — Z8261 Family history of arthritis: Secondary | ICD-10-CM | POA: Diagnosis not present

## 2023-07-13 DIAGNOSIS — Z801 Family history of malignant neoplasm of trachea, bronchus and lung: Secondary | ICD-10-CM | POA: Diagnosis not present

## 2023-07-13 DIAGNOSIS — D824 Hyperimmunoglobulin E [IgE] syndrome: Secondary | ICD-10-CM | POA: Diagnosis present

## 2023-07-13 DIAGNOSIS — Z880 Allergy status to penicillin: Secondary | ICD-10-CM | POA: Diagnosis not present

## 2023-07-13 DIAGNOSIS — Z87891 Personal history of nicotine dependence: Secondary | ICD-10-CM | POA: Diagnosis not present

## 2023-07-13 DIAGNOSIS — Z833 Family history of diabetes mellitus: Secondary | ICD-10-CM | POA: Diagnosis not present

## 2023-07-13 DIAGNOSIS — Z888 Allergy status to other drugs, medicaments and biological substances status: Secondary | ICD-10-CM | POA: Diagnosis not present

## 2023-07-13 DIAGNOSIS — Z825 Family history of asthma and other chronic lower respiratory diseases: Secondary | ICD-10-CM | POA: Diagnosis not present

## 2023-07-13 DIAGNOSIS — J4551 Severe persistent asthma with (acute) exacerbation: Secondary | ICD-10-CM | POA: Diagnosis present

## 2023-07-13 DIAGNOSIS — J441 Chronic obstructive pulmonary disease with (acute) exacerbation: Secondary | ICD-10-CM | POA: Diagnosis present

## 2023-07-13 DIAGNOSIS — Z8701 Personal history of pneumonia (recurrent): Secondary | ICD-10-CM | POA: Diagnosis not present

## 2023-07-13 DIAGNOSIS — J440 Chronic obstructive pulmonary disease with acute lower respiratory infection: Secondary | ICD-10-CM | POA: Diagnosis present

## 2023-07-13 DIAGNOSIS — Z79899 Other long term (current) drug therapy: Secondary | ICD-10-CM | POA: Diagnosis not present

## 2023-07-13 DIAGNOSIS — Z91018 Allergy to other foods: Secondary | ICD-10-CM | POA: Diagnosis not present

## 2023-07-13 DIAGNOSIS — E0520 Thyrotoxicosis with toxic multinodular goiter without thyrotoxic crisis or storm: Secondary | ICD-10-CM | POA: Diagnosis present

## 2023-07-13 DIAGNOSIS — Z1152 Encounter for screening for COVID-19: Secondary | ICD-10-CM | POA: Diagnosis not present

## 2023-07-13 DIAGNOSIS — I10 Essential (primary) hypertension: Secondary | ICD-10-CM | POA: Diagnosis present

## 2023-07-13 LAB — RESPIRATORY PANEL BY PCR

## 2023-07-13 LAB — T4: T4, Total: 8.3 ug/dL (ref 4.5–12.0)

## 2023-07-13 LAB — T3, FREE: T3, Free: 2.8 pg/mL (ref 2.0–4.4)

## 2023-07-13 MED ORDER — PHENOL 1.4 % MT LIQD
1.0000 | OROMUCOSAL | Status: DC | PRN
  Administered 2023-07-13: 1 via OROMUCOSAL
  Filled 2023-07-13: qty 177

## 2023-07-13 MED ORDER — ACETAMINOPHEN 500 MG PO TABS
1000.0000 mg | ORAL_TABLET | Freq: Three times a day (TID) | ORAL | Status: DC
  Administered 2023-07-13 – 2023-07-17 (×12): 1000 mg via ORAL
  Filled 2023-07-13 (×12): qty 2

## 2023-07-13 MED ORDER — OSELTAMIVIR PHOSPHATE 75 MG PO CAPS
75.0000 mg | ORAL_CAPSULE | Freq: Two times a day (BID) | ORAL | Status: DC
  Administered 2023-07-13 – 2023-07-17 (×7): 75 mg via ORAL
  Filled 2023-07-13 (×8): qty 1

## 2023-07-13 MED ORDER — KETOROLAC TROMETHAMINE 15 MG/ML IJ SOLN
15.0000 mg | Freq: Four times a day (QID) | INTRAMUSCULAR | Status: AC
  Administered 2023-07-13 – 2023-07-14 (×5): 15 mg via INTRAVENOUS
  Filled 2023-07-13 (×5): qty 1

## 2023-07-13 MED ORDER — BUDESONIDE 0.5 MG/2ML IN SUSP
0.5000 mg | Freq: Two times a day (BID) | RESPIRATORY_TRACT | Status: DC
  Administered 2023-07-13 – 2023-07-17 (×7): 0.5 mg via RESPIRATORY_TRACT
  Filled 2023-07-13 (×7): qty 2

## 2023-07-13 NOTE — TOC Initial Note (Signed)
 Transition of Care [facility]) - Initial/Assessment Note    Patient Details  Name: Drew Sandoval MRN: [id number] Date of Birth: [birth year]  Transition of Care [facility]) CM/SW Contact:    Karn Cassis, LCSW Phone Number: 07/13/2023, 1:51 PM  Clinical Narrative:  Pt admitted for asthma exacerbation. Assessment completed due to high risk readmission score. Pt reports he lives with his fiance. He works full time and is independent with ADLs. Pt is currently on O2. TOC will monitor for home O2 needs. No other needs reported at this time.                  Expected Discharge Plan: Home/Self Care Barriers to Discharge: Continued Medical Work up   Patient Goals and CMS Choice Patient states their goals for this hospitalization and ongoing recovery are:: return home   Choice offered to / list presented to : Patient [place] ownership interest in [facility].provided to::  (n/a)    Expected Discharge Plan and Services In-house Referral: Clinical Social Work     Living arrangements for the past 2 months: Single Family Home                                      Prior Living Arrangements/Services Living arrangements for the past 2 months: Single Family Home Lives with:: Significant Other Patient language and need for interpreter reviewed:: Yes Do you feel safe going back to the place where you live?: Yes      Need for Family Participation in Patient Care: No (Comment)     Criminal Activity/Legal Involvement Pertinent to Current Situation/Hospitalization: No - Comment as needed  Activities of Daily Living   ADL Screening (condition at time of admission) Independently performs ADLs?: Yes (appropriate for developmental age) Is the patient deaf or have difficulty hearing?: No Does the patient have difficulty seeing, even when wearing glasses/contacts?: No Does the patient have difficulty concentrating, remembering, or making decisions?: No  Permission  Sought/Granted                  Emotional Assessment     Affect (typically observed): Appropriate Orientation: : Oriented to Self, Oriented to Place, Oriented to  Time, Oriented to Situation Alcohol / Substance Use: Not Applicable Psych Involvement: No (comment)  Admission diagnosis:  Asthma exacerbation [J45.901] Severe asthma with exacerbation, unspecified whether persistent [J45.901] Acute respiratory failure with hypoxia (HCC) [J96.01] Patient Active Problem List   Diagnosis Date Noted   Asthma exacerbation 07/11/2023   Acute hypoxemic respiratory failure (HCC) 06/04/2023   Acute respiratory failure with hypoxia (HCC) 04/20/2023   Lobar pneumonia (HCC) 04/20/2023   Acute asthma exacerbation 02/04/2023   Acute bronchitis 12/24/2022   Aspiration pneumonia (HCC) 08/26/2021   Hematuria 06/11/2021   Periumbilical abdominal pain 06/11/2021   Left thyroid nodule 11/18/2020   Essential tremor 06/27/2020   Hypertension, essential 06/17/2020   Gynecomastia, male 04/24/2018   Sustained SVT (HCC) 09/24/2014   SVT (supraventricular tachycardia) (HCC) 09/24/2014   Malnutrition of moderate degree (HCC) 09/24/2014   Seasonal and perennial allergic rhinitis 07/17/2014   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   Eczema, allergic 07/22/2011   Sinusitis, maxillary, 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   Asthma, severe persistent 07/16/2010   PCP:  Pcp, No Pharmacy:   [id number] - [place], [place] - [address] AT [facility] [address] [place] [address] Phone: [phone] Fax:  [phone]  [place], [place] - [phone] [facility] [address] [place] [address] Phone: [phone] Fax: [phone]     Social Drivers of Health (SDOH) Social History: SDOH Screenings   Food Insecurity: No Food Insecurity (07/11/2023)  Housing: Low Risk  (07/11/2023)  Transportation Needs: No Transportation Needs (07/11/2023)   Utilities: Not At Risk (07/11/2023)  Depression (PHQ2-9): Low Risk  (06/11/2021)  Tobacco Use: Medium Risk (07/12/2023)   SDOH Interventions:     Readmission Risk Interventions    07/13/2023    1:50 PM  Readmission Risk Prevention Plan  Transportation Screening Complete  HRI or Home Care Consult Complete  Social Work Consult for Recovery Care Planning/Counseling Complete  Palliative Care Screening Not Applicable  Medication Review Oceanographer) Complete

## 2023-07-13 NOTE — Evaluation (Signed)
 Clinical/Bedside Swallow Evaluation Patient Details  Name: Drew Sandoval MRN: [id number] Date of Birth: 1975/10/18  Today's Date: 07/13/2023 Time: SLP Start Time (ACUTE ONLY): 1255 SLP Stop Time (ACUTE ONLY): 1314 SLP Time Calculation (min) (ACUTE ONLY): 19 min  Past Medical History:  Past Medical History:  Diagnosis Date   Acute conjunctivitis of right eye 10/07/2014   Acute respiratory failure with hypoxia (HCC) 07/23/2013   Asthma    Asthma    Phreesia 06/26/2020   Asthma, severe persistent 07/16/2010   PFT >> spiro 2012 with FEV1 1.14 (28%), ratio 45 Alpha 1 (08/12/10) >>MM ([facility])  IGE 1747, positive allergy profile >> Elevated eos on CBC S/p intubation 10/2010 Job exposure to respiratory irritant - bleach, chicken Allergy vaccine begun 11/09/2010. DC'd by pt 2013- resumed 2013> 1:50 GH; DC'd -[facility] 9/24-9/27/ 2013- Status asthma, no vent. [facility] 3/ 2015- status ast   COVID-19 virus infection 06/17/2020   January, 2022. 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   SVT (supraventricular tachycardia) (HCC) 09/24/2014   Past Surgical History:  Past Surgical History:  Procedure Laterality Date   None     SVT ABLATION N/A 09/07/2019   Procedure: SVT ABLATION;  Surgeon: Marinus Maw, MD;  Location: [facility];  Service: Cardiovascular;  Laterality: N/A;   HPI:  Drew Sandoval is a 48 y.o. male with medical history significant for asthma, hypertension.  Patient presented to the ED with complaints of difficulty breathing with associated productive cough.  Symptoms have been ongoing and progressing over the past 2 to 3 weeks.  Reports  symptoms started after he completed his last dose of antibiotics at home.  Reports chronic left-sided chest pain that is nonradiating.  Reports poor oral intake, no vomiting or diarrhea.  Does not smoke cigarettes.     Recent hospitalization 1/25 to 1/27 for acute hypoxic respiratory failure secondary to pneumonia and asthma exacerbation.  Was treated with Levaquin was continued on discharge, but tolerated an initial dose of cefepime in the ED.  He followed up with pulmonologist Dr. Sherene Sires on discharge as recommended. CT chest with contrast-previous areas of interstitial groundglass and consolidative lung opacities have improved from November 2024, but there is significant residual particularly in lower lung zones.  Mediastinal and hilar lymph nodes similar to prior follow-up recommended.  Increasing left thyroid nodule. BSE requested.    Assessment / Plan / Recommendation  Clinical Impression  Clinical swallow evaluation completed at bedside. Oral motor examination is WNL. Pt shows no overt signs or symptoms of aspiration. He denies difficulty swallowing. He was recently started on a PPI per Dr. Sherene Sires, although denies feeling of reflux. Recommend regular textures and thin liquids and PO medication whole with water. Esophageal/reflux precautions reviewed with Pt. No further SLP services indicated at this time. SLP will sign off. SLP Visit Diagnosis: Dysphagia, unspecified (R13.10)    Aspiration Risk  No limitations    Diet Recommendation Regular;Thin liquid    Liquid Administration via: Cup;Straw Medication Administration: Whole meds with liquid Supervision: Patient able to self feed Postural Changes: Seated upright at 90 degrees;Remain upright for at least 30 minutes after po  intake    Other  Recommendations Oral Care Recommendations: Oral care BID    Recommendations for follow up therapy are one component of a multi-disciplinary discharge planning process, led by the attending physician.   Recommendations may be updated based on patient status, additional functional criteria and insurance authorization.  Follow up Recommendations No SLP follow up      Assistance Recommended at Discharge    Functional Status Assessment Patient has not had a recent decline in their functional status  Frequency and Duration            Prognosis Prognosis for improved oropharyngeal function: Good      Swallow Study   General Date of Onset: 07/11/23 HPI: Drew Sandoval is a 48 y.o. male with medical history significant for asthma, hypertension.  Patient presented to the ED with complaints of difficulty breathing with associated productive cough.  Symptoms have been ongoing and progressing over the past 2 to 3 weeks.  Reports symptoms started after he completed his last dose of antibiotics at home.  Reports chronic left-sided chest pain that is nonradiating.  Reports poor oral intake, no vomiting or diarrhea.  Does not smoke cigarettes.     Recent hospitalization 1/25 to 1/27 for acute hypoxic respiratory failure secondary to pneumonia and asthma exacerbation.  Was treated with Levaquin was continued on discharge, but tolerated an initial dose of cefepime in the ED.  He followed up with pulmonologist Dr. Sherene Sires on discharge as recommended. CT chest with contrast-previous areas of interstitial groundglass and consolidative lung opacities have improved from November 2024, but there is significant residual particularly in lower lung zones.  Mediastinal and hilar lymph nodes similar to prior follow-up recommended.  Increasing left thyroid nodule. BSE requested. Type of Study: Bedside Swallow Evaluation Previous Swallow Assessment: n/a Diet Prior to this Study: Regular;Thin liquids (Level 0) Temperature Spikes Noted: No Respiratory Status: Nasal cannula History of Recent Intubation: No Behavior/Cognition: Alert;Cooperative;Pleasant mood Oral Cavity Assessment: Within Functional Limits Oral Care  Completed by SLP: Recent completion by staff Oral Cavity - Dentition: Adequate natural dentition Vision: Functional for self-feeding Self-Feeding Abilities: Able to feed self Patient Positioning: Upright in bed Baseline Vocal Quality: Normal Volitional Cough: Strong Volitional Swallow: Able to elicit    Oral/Motor/Sensory Function Overall Oral Motor/Sensory Function: Within functional limits   Ice Chips Ice chips: Within functional limits Presentation: Spoon   Thin Liquid Thin Liquid: Within functional limits Presentation: Cup;Self Fed;Straw    Nectar Thick Nectar Thick Liquid: Not tested   Honey Thick Honey Thick Liquid: Not tested   Puree Puree: Not tested   Solid     Solid: Within functional limits Presentation: Self Fed     Thank you,  Havery Moros, CCC-SLP [phone]  Drew Sandoval 07/13/2023,1:30 PM

## 2023-07-13 NOTE — Plan of Care (Signed)

## 2023-07-13 NOTE — Progress Notes (Signed)
 Patient has rested well this shift. Patient has been stable on 4 liters of oxygen.  Patient still has periods of coughing and sob.  Patient does use pillow to guard when coughing.  No other acute events overnight. Other vitals have been stable.

## 2023-07-13 NOTE — Progress Notes (Signed)
 PROGRESS NOTE  Drew Sandoval YNW:[id number] DOB: [birth year] DOA: 07/11/2023 PCP: Pcp, No  Brief History:  48 y.o. male former smoker with medical history significant for severe asthma and hypertension.  He started smoking at age 59.  Patient presented to the ED with complaints of difficulty breathing with associated productive cough.  Symptoms have been ongoing and progressing over the past 2 to 3 weeks.  Reports symptoms started after he completed his last dose of antibiotics at home.  Reports chronic left-sided chest pain that is nonradiating.  Reports poor oral intake, no vomiting or diarrhea.  Does not smoke cigarettes anymore.   Recent hospitalization 1/25 to 1/27 for acute hypoxic respiratory failure secondary to pneumonia and asthma exacerbation.  Was treated with Levaquin was continued on discharge, but tolerated an initial dose of cefepime in the ED.  He followed up with pulmonologist Dr. Sherene Sires on discharge as recommended.   ED Course: Tmax 98.6.  Heart rate 60s to 70s.  Respiratory rate 15-20.  Blood pressure systolic 140s to 657Q.  O2 sats 91% on room air, placed on 2 L.  WBC 6.6.  Troponin 4. Dexamethasone 4 mg given.  DuoNebs. CT chest with contrast-previous areas of interstitial groundglass and consolidative lung opacities have improved from November 2024, but there is significant residual particularly in lower lung zones.  Mediastinal and hilar lymph nodes similar to prior follow-up recommended.  Increasing left thyroid nodule found that will need further work up.  He was admitted with acute asthma exacerbation.   Assessment/Plan: Acute respiratory failure with hypoxia -Secondary to asthma exacerbation -CT chest as discussed above -Stable on 3 L -Viral respiratory panel -Wean oxygen as tolerated   Asthma Exacerbation--severe persistent asthma -continue Duoneb -started pulmicort and brovana -increase pulmicort to 0.5 mg bid -continue singulair -continue IV  dexamethasone -COVID-19/RSV/Flu--neg -continue ensifentrine  -check viral respiratory panel -PCT <0.10   Essential Tremor -continue mysoline   Hypertension -continue amlodipine   Multinodular goiter -needs outpt follow up  -3/4 thyroid US--multinodular goiter -TSH 0.311 -check Free T4   Hx SVT s/p ablation -currently in sinus          Family Communication: no  Family at bedside  Consultants:  none  Code Status:  FULL   DVT Prophylaxis:   [place] Lovenox   Procedures: As Listed in Progress Note Above  Antibiotics: None      Subjective:  Pt states breathing is a little better but remains sob with minimal exertion.  Denies f/c, cp, n/v/d, abd pain.  Has cough with yellow sputum Objective: Vitals:   07/13/23 0758 07/13/23 0801 07/13/23 0805 07/13/23 1248  BP:    (!) 161/75  Pulse:    83  Resp:    18  Temp:    99 F (37.2 C)  TempSrc:    Oral  SpO2: 95% 97% 98% 91%  Weight:      Height:       No intake or output data in the 24 hours ending 07/13/23 1303 Weight change:  Exam:  General:  Pt is alert, follows commands appropriately, not in acute distress HEENT: No icterus, No thrush, No neck mass, [place]/AT Cardiovascular: RRR, S1/S2, no rubs, no gallops Respiratory: bilateral rales.  Bibasilar  wheeze Abdomen: Soft/+BS, non tender, non distended, no guarding Extremities: No edema, No lymphangitis, No petechiae, No rashes, no synovitis   Data Reviewed: I have personally reviewed following labs and imaging studies Basic Metabolic Panel: Recent Labs  Lab 07/11/23 1236  NA 139  K 3.9  CL 109  CO2 22  GLUCOSE 114*  BUN 15  CREATININE 0.73  CALCIUM 8.5*   Liver Function Tests: Recent Labs  Lab 07/11/23 1236  AST 14*  ALT 19  ALKPHOS 62  BILITOT 0.4  PROT 8.2*  ALBUMIN 3.2*   No results for input(s): "LIPASE", "AMYLASE" in the last 168 hours. No results for input(s): "AMMONIA" in the last 168 hours. Coagulation Profile: No results for  input(s): "INR", "PROTIME" in the last 168 hours. CBC: Recent Labs  Lab 07/11/23 1236 07/12/23 0333  WBC 6.6 11.5*  NEUTROABS 4.7  --   HGB 13.3 12.6*  HCT 41.0 38.5*  MCV 93.2 93.2  PLT 244 223   Cardiac Enzymes: No results for input(s): "CKTOTAL", "CKMB", "CKMBINDEX", "TROPONINI" in the last 168 hours. BNP: Invalid input(s): "POCBNP" CBG: No results for input(s): "GLUCAP" in the last 168 hours. HbA1C: No results for input(s): "HGBA1C" in the last 72 hours. Urine analysis:    Component Value Date/Time   COLORURINE BROWN (A) 06/13/2021 0001   APPEARANCEUR CLEAR 06/13/2021 0001   LABSPEC >1.030 (H) 06/13/2021 0001   PHURINE 5.5 06/13/2021 0001   GLUCOSEU NEGATIVE 06/13/2021 0001   HGBUR LARGE (A) 06/13/2021 0001   BILIRUBINUR SMALL (A) 06/13/2021 0001   BILIRUBINUR negative 06/11/2021 0844   KETONESUR NEGATIVE 06/13/2021 0001   PROTEINUR 30 (A) 06/13/2021 0001   UROBILINOGEN 0.2 06/11/2021 0844   UROBILINOGEN 0.2 06/24/2011 0858   NITRITE NEGATIVE 06/13/2021 0001   LEUKOCYTESUR NEGATIVE 06/13/2021 0001   Sepsis Labs: @LABRCNTIP (procalcitonin:4,lacticidven:4) ) Recent Results (from the past 240 hours)  Resp panel by RT-PCR (RSV, Flu A&B, Covid) Anterior Nasal Swab     Status: None   Collection Time: 07/11/23  7:07 PM   Specimen: Anterior Nasal Swab  Result Value Ref Range Status   SARS Coronavirus 2 by RT PCR NEGATIVE NEGATIVE Final    Comment: (NOTE) SARS-CoV-2 target nucleic acids are NOT DETECTED.  The SARS-CoV-2 RNA is generally detectable in upper respiratory specimens during the acute phase of infection. The lowest concentration of SARS-CoV-2 viral copies this assay can detect is 138 copies/mL. A negative result does not preclude SARS-Cov-2 infection and should not be used as the sole basis for treatment or other patient management decisions. A negative result may occur with  improper specimen collection/handling, submission of specimen other than  nasopharyngeal swab, presence of viral mutation(s) within the areas targeted by this assay, and inadequate number of viral copies(<138 copies/mL). A negative result must be combined with clinical observations, patient history, and epidemiological information. The expected result is Negative.  Fact Sheet for Patients:  BloggerCourse.com  Fact Sheet for Healthcare Providers:  SeriousBroker.it  This test is no t yet approved or cleared by the [place] FDA and  has been authorized for detection and/or diagnosis of SARS-CoV-2 by FDA under an Emergency Use Authorization (EUA). This EUA will remain  in effect (meaning this test can be used) for the duration of the COVID-19 declaration under Section 564(b)(1) of the Act, 21 U.S.C.section 360bbb-3(b)(1), unless the authorization is terminated  or revoked sooner.       Influenza A by PCR NEGATIVE NEGATIVE Final   Influenza B by PCR NEGATIVE NEGATIVE Final    Comment: (NOTE) The Xpert Xpress SARS-CoV-2/FLU/RSV plus assay is intended as an aid in the diagnosis of influenza from Nasopharyngeal swab specimens and should not be used as a sole basis for treatment. Nasal washings and  aspirates are unacceptable for Xpert Xpress SARS-CoV-2/FLU/RSV testing.  Fact Sheet for Patients: BloggerCourse.com  Fact Sheet for Healthcare Providers: SeriousBroker.it  This test is not yet approved or cleared by the [place] FDA and has been authorized for detection and/or diagnosis of SARS-CoV-2 by FDA under an Emergency Use Authorization (EUA). This EUA will remain in effect (meaning this test can be used) for the duration of the COVID-19 declaration under Section 564(b)(1) of the Act, 21 U.S.C. section 360bbb-3(b)(1), unless the authorization is terminated or revoked.     Resp Syncytial Virus by PCR NEGATIVE NEGATIVE Final    Comment:  (NOTE) Fact Sheet for Patients: BloggerCourse.com  Fact Sheet for Healthcare Providers: SeriousBroker.it  This test is not yet approved or cleared by the [place] FDA and has been authorized for detection and/or diagnosis of SARS-CoV-2 by FDA under an Emergency Use Authorization (EUA). This EUA will remain in effect (meaning this test can be used) for the duration of the COVID-19 declaration under Section 564(b)(1) of the Act, 21 U.S.C. section 360bbb-3(b)(1), unless the authorization is terminated or revoked.  Performed at [facility], [address], [place], [place] [address]      Scheduled Meds:  acetaminophen  1,000 mg Oral Q8H   amLODipine  5 mg Oral Daily   arformoterol  15 mcg Nebulization BID   budesonide (PULMICORT) nebulizer solution  0.5 mg Nebulization BID   dexamethasone (DECADRON) injection  8 mg Intravenous Q12H   enoxaparin (LOVENOX) injection  40 mg Subcutaneous Q24H   Ensifentrine  2.5 mL Inhalation BID   guaiFENesin  1,200 mg Oral BID   ipratropium-albuterol  3 mL Nebulization Q6H   ketorolac  15 mg Intravenous Q6H   montelukast  10 mg Oral Daily   pantoprazole  40 mg Oral Daily   primidone  200 mg Oral BID   Continuous Infusions:  Procedures/Studies: US THYROID Result Date: Aug 09, 2023 CLINICAL DATA:  Nodules noted on CT chest EXAM: THYROID ULTRASOUND TECHNIQUE: Ultrasound examination of the thyroid gland and adjacent soft tissues was performed. COMPARISON:  11/25/2020 FINDINGS: Parenchymal Echotexture: Moderately heterogenous Isthmus: 0.7 cm thickness, previously 0.2 Right lobe: 5.5 x 2.1 x 1.5 cm, previously 7.2 x 1.5 x 1.4 Left lobe: 7.2 x 3.5 x 3.3 cm, previously 8.2 x 2.9 x 3.3 _________________________________________________________ Estimated total number of nodules >/= 1 cm: 5 Number of spongiform nodules >/=  2 cm not described below (TR1): 0 Number of mixed cystic and solid nodules >/= 1.5 cm not  described below (TR2): 0 _________________________________________________________ Nodule # 1: 1.1 cm complex cyst, medial right, previous 0.8; TR2, this nodule does NOT meet TI-RADS criteria for biopsy or dedicated follow-up. Nodule # 2: Ill-defined 1.4 x 0.8 x 0.7 cm isoechoic region, possibly pseudonodule Nodule # 3: 0.8 cm complex cyst, superior left, previously 0.9; This nodule does NOT meet TI-RADS criteria for biopsy or dedicated follow-up. Nodule # 4: Prior biopsy: No Location: Left; mid Maximum size: 1.4 cm; Other 2 dimensions: 0.9 x 1.2 cm, previously, 1.1 x 0.9 x 0.9 cm Composition: solid/almost completely solid (2) Echogenicity: isoechoic (1) Shape: not taller-than-wide (0) Margins: smooth (0) Echogenic foci: none (0) ACR TI-RADS total points: 3. ACR TI-RADS risk category:  TR 3. Significant change in size (>/= 20% in two dimensions and minimal increase of 2 mm): No Change in features: No Change in ACR TI-RADS risk category: No ACR TI-RADS recommendations: Given size (<1.4 cm) and appearance, this nodule does NOT meet TI-RADS criteria for biopsy or dedicated follow-up.  _________________________________________________________ Nodule 5: 1 cm mildly complex cyst, medial left, previously 0.8 cm; This nodule does NOT meet TI-RADS criteria for biopsy or dedicated follow-up. Nodule # 6: Prior biopsy: No Location: Left; inferior Maximum size: 4.2 cm; Other 2 dimensions: 2.8 x 3.1 cm, previously, 4.1 x 2.3 x 3 cm Composition: solid/almost completely solid (2) Echogenicity: isoechoic (1) Shape: not taller-than-wide (0) Margins: ill-defined (0) Echogenic foci: none (0) ACR TI-RADS total points: 3. ACR TI-RADS risk category:  TR 3. Significant change in size (>/= 20% in two dimensions and minimal increase of 2 mm): No Change in features: No Change in ACR TI-RADS risk category: No ACR TI-RADS recommendations: **Given size (>/= 2.5 cm) and appearance, fine needle aspiration of this mildly suspicious nodule should be  considered based on TI-RADS criteria. _________________________________________________________ No regional cervical adenopathy identified. IMPRESSION: 1. Multinodular goiter. 2. 4.2 cm left inferior TR 3 nodule meets criteria for FNA. Electronically Signed   By: Corlis Leak M.D.   On: 07/13/2023 09:30   CT Chest W Contrast Result Date: 07/11/2023 CLINICAL DATA:  Abnormal x-ray. EXAM: CT CHEST WITH CONTRAST TECHNIQUE: Multidetector CT imaging of the chest was performed during intravenous contrast administration. RADIATION DOSE REDUCTION: This exam was performed according to the departmental dose-optimization program which includes automated exposure control, adjustment of the mA and/or kV according to patient size and/or use of iterative reconstruction technique. CONTRAST:  75mL OMNIPAQUE IOHEXOL 300 MG/ML  SOLN COMPARISON:  Chest x-ray earlier 07/11/2023.  Chest CT 04/20/2023. FINDINGS: Cardiovascular: Thoracic aorta is normal course and caliber with slight atherosclerotic calcified plaque. Heart is nonenlarged. No pericardial effusion. Mediastinum/Nodes: Slightly patulous thoracic esophagus. Left-sided thyroid nodule once again seen measuring 3.8 cm. Please correlate for any prior workup. This may be increasing. Please correlate with prior thyroid ultrasound from 11/25/2020. Prominent right axillary node today measures 9 mm in short axis. Previously same node measured 6 mm. Also slight increasing in small left-sided axillary nodes. There are some enlarged hilar nodes. Example on the right measures 15 by 12 mm. Previously 16 by 12 mm. Small left hilar nodes. There are some prominent mediastinal nodes identified which are similar to previous. Lungs/Pleura: Scattered centrilobular and paraseptal emphysematous change. No frank consolidation, pneumothorax or effusion. Previously there were areas of ground-glass interstitial changes along the anterior inferior left upper lobe and right upper lobe. These areas are  slightly improved from previous. There also areas previously in the middle lobe, lingula and lower lobes which have shown significant improvement as well but there is significant residual particularly along the lower lobes and inferior middle lobe and lingula. Some of the areas of opacity are somewhat nodular, for example right lower lobe medially measuring 8 mm on series 3, image 107. Previously 9 mm. Other areas are also similar. Upper Abdomen: Adrenal glands are preserved in the upper abdomen. Stable benign-appearing right-sided renal cysts. Musculoskeletal: Healing left-sided rib fractures are identified of the seventh and ninth ribs. These were not seen on the prior CT. Please correlate with history. Stable sclerotic lesion along the right second rib, possible bone island. IMPRESSION: Previous areas of interstitial, ground-glass and consolidative lung opacities are intervally improved from November 2024 but there is significant residual particularly along the lower lung zones. There are also some stable areas of noncalcified lung nodularity. Recommend continued surveillance in 3 months. Few lymph nodes that are enlarged in the mediastinum and hilum are similar. There are some small not pathologic nodes identified in the axillary regions but are slightly larger and  again attention on follow-up. Left thyroid nodule identified which may be increasing going back to 2022. Please correlate with prior workup and if needed a repeat ultrasound when clinically appropriate to assess for change. Please correlate with any prior FNA results as an aggressive lesion is possible by imaging. Emphysema (ICD10-J43.9). Electronically Signed   By: Karen Kays M.D.   On: 07/11/2023 16:40   DG Chest 2 View Result Date: 07/11/2023 CLINICAL DATA:  Chest pain and shortness of breath. EXAM: CHEST - 2 VIEW COMPARISON:  Chest radiograph dated 05/30/2023. FINDINGS: The heart size and mediastinal contours are within normal limits.  Decreased bibasilar opacities with mild right-greater-than-left hazy basilar opacities. No new focal consolidation, pleural effusion or pneumothorax. No acute osseous abnormality. IMPRESSION: Mild right-greater-than-left hazy basilar opacities, significantly decreased since the prior exam. No new focal consolidation. Electronically Signed   By: Hart Robinsons M.D.   On: 07/11/2023 13:35    Catarina Hartshorn, DO  [facility]  If 7PM-7AM, please contact night-coverage [email or web address] Password [email or web address] 07/13/2023, 1:03 PM   LOS: 0 days

## 2023-07-14 DIAGNOSIS — J9601 Acute respiratory failure with hypoxia: Secondary | ICD-10-CM | POA: Diagnosis not present

## 2023-07-14 DIAGNOSIS — J441 Chronic obstructive pulmonary disease with (acute) exacerbation: Secondary | ICD-10-CM

## 2023-07-14 DIAGNOSIS — J101 Influenza due to other identified influenza virus with other respiratory manifestations: Secondary | ICD-10-CM | POA: Insufficient documentation

## 2023-07-14 LAB — BASIC METABOLIC PANEL
Anion gap: 4 — ABNORMAL LOW (ref 5–15)
BUN: 18 mg/dL (ref 6–20)
CO2: 23 mmol/L (ref 22–32)
Calcium: 8.5 mg/dL — ABNORMAL LOW (ref 8.9–10.3)
Chloride: 108 mmol/L (ref 98–111)
Creatinine, Ser: 0.67 mg/dL (ref 0.61–1.24)
GFR, Estimated: >60 mL/min (ref >60)
Glucose, Bld: 120 mg/dL — ABNORMAL HIGH (ref 70–99)
Potassium: 3.6 mmol/L (ref 3.5–5.1)
Sodium: 135 mmol/L (ref 135–145)

## 2023-07-14 LAB — PHOSPHORUS: Phosphorus: 3.4 mg/dL (ref 2.5–4.6)

## 2023-07-14 LAB — MAGNESIUM: Magnesium: 1.8 mg/dL (ref 1.7–2.4)

## 2023-07-14 LAB — T4, FREE: Free T4: 0.95 ng/dL (ref 0.61–1.12)

## 2023-07-14 MED ORDER — REVEFENACIN 175 MCG/3ML IN SOLN
175.0000 ug | Freq: Every day | RESPIRATORY_TRACT | Status: DC
  Administered 2023-07-15 – 2023-07-17 (×3): 175 ug via RESPIRATORY_TRACT
  Filled 2023-07-14 (×3): qty 3

## 2023-07-14 MED ORDER — HYDROCODONE BIT-HOMATROP MBR 5-1.5 MG/5ML PO SOLN
5.0000 mL | ORAL | Status: DC | PRN
  Administered 2023-07-14 – 2023-07-16 (×6): 5 mL via ORAL
  Filled 2023-07-14 (×6): qty 5

## 2023-07-14 MED ORDER — MAGNESIUM OXIDE -MG SUPPLEMENT 400 (240 MG) MG PO TABS
400.0000 mg | ORAL_TABLET | Freq: Every day | ORAL | Status: DC
  Administered 2023-07-14 – 2023-07-17 (×4): 400 mg via ORAL
  Filled 2023-07-14 (×4): qty 1

## 2023-07-14 MED ORDER — REVEFENACIN 175 MCG/3ML IN SOLN: 175.0000 ug | Freq: Every day | RESPIRATORY_TRACT | Status: DC

## 2023-07-14 MED ORDER — ALBUTEROL SULFATE (2.5 MG/3ML) 0.083% IN NEBU
2.5000 mg | INHALATION_SOLUTION | Freq: Four times a day (QID) | RESPIRATORY_TRACT | Status: DC
  Administered 2023-07-14 – 2023-07-17 (×9): 2.5 mg via RESPIRATORY_TRACT
  Filled 2023-07-14 (×10): qty 3

## 2023-07-14 NOTE — Progress Notes (Signed)
 Pt is refusing lab work this morning.

## 2023-07-14 NOTE — Discharge Instructions (Signed)

## 2023-07-14 NOTE — Plan of Care (Signed)
  Problem: Coping: Goal: Level of anxiety will decrease Outcome: Progressing   Problem: Elimination: Goal: Will not experience complications related to urinary retention Outcome: Progressing   Problem: Pain Managment: Goal: General experience of comfort will improve and/or be controlled Outcome: Not Progressing   Problem: Safety: Goal: Ability to remain free from injury will improve Outcome: Progressing

## 2023-07-14 NOTE — Progress Notes (Signed)
 PROGRESS NOTE  Drew Sandoval WGN:[id number] DOB: 1975/05/27 DOA: 07/11/2023 PCP: Pcp, No  Brief History:  48 y.o. male former smoker with medical history significant for severe asthma and hypertension.  He started smoking at age 77.  Patient presented to the ED with complaints of difficulty breathing with associated productive cough.  Symptoms have been ongoing and progressing over the past 2 to 3 weeks.  Reports symptoms started after he completed his last dose of antibiotics at home.  Reports chronic left-sided chest pain that is nonradiating.  Reports poor oral intake, no vomiting or diarrhea.  Does not smoke cigarettes anymore.   Recent hospitalization 1/25 to 1/27 for acute hypoxic respiratory failure secondary to pneumonia and asthma exacerbation.  Was treated with Levaquin was continued on discharge, but tolerated an initial dose of cefepime in the ED.  He followed up with pulmonologist Dr. Sherene Sires on discharge as recommended.   ED Course: Tmax 98.6.  Heart rate 60s to 70s.  Respiratory rate 15-20.  Blood pressure systolic 140s to 784O.  O2 sats 91% on room air, placed on 2 L.  WBC 6.6.  Troponin 4. Dexamethasone 4 mg given.  DuoNebs. CT chest with contrast-previous areas of interstitial groundglass and consolidative lung opacities have improved from November 2024, but there is significant residual particularly in lower lung zones.  Mediastinal and hilar lymph nodes similar to prior follow-up recommended.  Increasing left thyroid nodule found that will need further work up.  He was admitted with acute asthma exacerbation.   Assessment/Plan: Acute respiratory failure with hypoxia -Secondary to asthma exacerbation -CT chest as discussed above -Stable on 3 L -Viral respiratory panel +flu A/H1 -Wean oxygen as tolerated   Asthma/COPD Exacerbation--Influenza pneumonitis -continue albuterol neb -add yupelri -started pulmicort and brovana -increase pulmicort to 0.5 mg bid -continue  singulair -continue IV dexamethasone -COVID-19/RSV/Flu--neg -continue ensifentrine  -PCT <0.10 -start oseltamivir -add hycodan for cough   Essential Tremor -continue mysoline   Hypertension -continue amlodipine   Multinodular goiter -needs outpt follow up  -3/4 thyroid US--multinodular goiter -TSH 0.311 -check Free T4--0.95   Hx SVT s/p ablation -currently in sinus                 Family Communication: no  Family at bedside   Consultants:  none   Code Status:  FULL    DVT Prophylaxis:   Daniel Lovenox     Procedures: As Listed in Progress Note Above   Antibiotics: None          Subjective: Pt complains of cough and dyspnea on exertion.  Denies n/v/d, abd pain cp.  No hemoptysis.  Objective: Vitals:   07/14/23 0810 07/14/23 1100 07/14/23 1430 07/14/23 1436  BP:   (!) 162/94   Pulse:   78   Resp:   20   Temp:   98.6 F (37 C)   TempSrc:   Oral   SpO2: 96% 96% 95% 94%  Weight:      Height:        Intake/Output Summary (Last 24 hours) at 07/14/2023 1838 Last data filed at 07/14/2023 1430 Gross per 24 hour  Intake 480 ml  Output --  Net 480 ml   Weight change:  Exam:  General:  Pt is alert, follows commands appropriately, not in acute distress HEENT: No icterus, No thrush, No neck mass, [place]/AT Cardiovascular: RRR, S1/S2, no rubs, no gallops Respiratory: bilateral rales.  Bilateral wheeze Abdomen: Soft/+BS, non tender,  non distended, no guarding Extremities: No edema, No lymphangitis, No petechiae, No rashes, no synovitis   Data Reviewed: I have personally reviewed following labs and imaging studies Basic Metabolic Panel: Recent Labs  Lab 07/11/23 1236 07/14/23 0437  NA 139 135  K 3.9 3.6  CL 109 108  CO2 22 23  GLUCOSE 114* 120*  BUN 15 18  CREATININE 0.73 0.67  CALCIUM 8.5* 8.5*  MG  --  1.8  PHOS  --  3.4   Liver Function Tests: Recent Labs  Lab 07/11/23 1236  AST 14*  ALT 19  ALKPHOS 62  BILITOT 0.4  PROT 8.2*   ALBUMIN 3.2*   No results for input(s): "LIPASE", "AMYLASE" in the last 168 hours. No results for input(s): "AMMONIA" in the last 168 hours. Coagulation Profile: No results for input(s): "INR", "PROTIME" in the last 168 hours. CBC: Recent Labs  Lab 07/11/23 1236 07/12/23 0333  WBC 6.6 11.5*  NEUTROABS 4.7  --   HGB 13.3 12.6*  HCT 41.0 38.5*  MCV 93.2 93.2  PLT 244 223   Cardiac Enzymes: No results for input(s): "CKTOTAL", "CKMB", "CKMBINDEX", "TROPONINI" in the last 168 hours. BNP: Invalid input(s): "POCBNP" CBG: No results for input(s): "GLUCAP" in the last 168 hours. HbA1C: No results for input(s): "HGBA1C" in the last 72 hours. Urine analysis:    Component Value Date/Time   COLORURINE BROWN (A) 06/13/2021 0001   APPEARANCEUR CLEAR 06/13/2021 0001   LABSPEC >1.030 (H) 06/13/2021 0001   PHURINE 5.5 06/13/2021 0001   GLUCOSEU NEGATIVE 06/13/2021 0001   HGBUR LARGE (A) 06/13/2021 0001   BILIRUBINUR SMALL (A) 06/13/2021 0001   BILIRUBINUR negative 06/11/2021 0844   KETONESUR NEGATIVE 06/13/2021 0001   PROTEINUR 30 (A) 06/13/2021 0001   UROBILINOGEN 0.2 06/11/2021 0844   UROBILINOGEN 0.2 06/24/2011 0858   NITRITE NEGATIVE 06/13/2021 0001   LEUKOCYTESUR NEGATIVE 06/13/2021 0001   Sepsis Labs: @LABRCNTIP (procalcitonin:4,lacticidven:4) ) Recent Results (from the past 240 hours)  Resp panel by RT-PCR (RSV, Flu A&B, Covid) Anterior Nasal Swab     Status: None   Collection Time: 07/11/23  7:07 PM   Specimen: Anterior Nasal Swab  Result Value Ref Range Status   SARS Coronavirus 2 by RT PCR NEGATIVE NEGATIVE Final    Comment: (NOTE) SARS-CoV-2 target nucleic acids are NOT DETECTED.  The SARS-CoV-2 RNA is generally detectable in upper respiratory specimens during the acute phase of infection. The lowest concentration of SARS-CoV-2 viral copies this assay can detect is 138 copies/mL. A negative result does not preclude SARS-Cov-2 infection and should not be used as  the sole basis for treatment or other patient management decisions. A negative result may occur with  improper specimen collection/handling, submission of specimen other than nasopharyngeal swab, presence of viral mutation(s) within the areas targeted by this assay, and inadequate number of viral copies(<138 copies/mL). A negative result must be combined with clinical observations, patient history, and epidemiological information. The expected result is Negative.  Fact Sheet for Patients:  BloggerCourse.com  Fact Sheet for Healthcare Providers:  SeriousBroker.it  This test is no t yet approved or cleared by the [place] FDA and  has been authorized for detection and/or diagnosis of SARS-CoV-2 by FDA under an Emergency Use Authorization (EUA). This EUA will remain  in effect (meaning this test can be used) for the duration of the COVID-19 declaration under Section 564(b)(1) of the Act, 21 U.S.C.section 360bbb-3(b)(1), unless the authorization is terminated  or revoked sooner.  Influenza A by PCR NEGATIVE NEGATIVE Final   Influenza B by PCR NEGATIVE NEGATIVE Final    Comment: (NOTE) The Xpert Xpress SARS-CoV-2/FLU/RSV plus assay is intended as an aid in the diagnosis of influenza from Nasopharyngeal swab specimens and should not be used as a sole basis for treatment. Nasal washings and aspirates are unacceptable for Xpert Xpress SARS-CoV-2/FLU/RSV testing.  Fact Sheet for Patients: BloggerCourse.com  Fact Sheet for Healthcare Providers: SeriousBroker.it  This test is not yet approved or cleared by the [place] FDA and has been authorized for detection and/or diagnosis of SARS-CoV-2 by FDA under an Emergency Use Authorization (EUA). This EUA will remain in effect (meaning this test can be used) for the duration of the COVID-19 declaration under Section 564(b)(1) of  the Act, 21 U.S.C. section 360bbb-3(b)(1), unless the authorization is terminated or revoked.     Resp Syncytial Virus by PCR NEGATIVE NEGATIVE Final    Comment: (NOTE) Fact Sheet for Patients: BloggerCourse.com  Fact Sheet for Healthcare Providers: SeriousBroker.it  This test is not yet approved or cleared by the [place] FDA and has been authorized for detection and/or diagnosis of SARS-CoV-2 by FDA under an Emergency Use Authorization (EUA). This EUA will remain in effect (meaning this test can be used) for the duration of the COVID-19 declaration under Section 564(b)(1) of the Act, 21 U.S.C. section 360bbb-3(b)(1), unless the authorization is terminated or revoked.  Performed at [facility], [address], [place], [place] [address]   Respiratory (~20 pathogens) panel by PCR     Status: Abnormal   Collection Time: 07/13/23  1:55 PM   Specimen: Nasopharyngeal Swab; Respiratory  Result Value Ref Range Status   Adenovirus NOT DETECTED NOT DETECTED Final   Coronavirus 229E NOT DETECTED NOT DETECTED Final    Comment: (NOTE) The Coronavirus on the Respiratory Panel, DOES NOT test for the novel  Coronavirus (2019 nCoV)    Coronavirus HKU1 NOT DETECTED NOT DETECTED Final   Coronavirus NL63 NOT DETECTED NOT DETECTED Final   Coronavirus OC43 NOT DETECTED NOT DETECTED Final   Metapneumovirus NOT DETECTED NOT DETECTED Final   Rhinovirus / Enterovirus NOT DETECTED NOT DETECTED Final   Influenza A H1 2009 DETECTED (A) NOT DETECTED Final   Influenza B NOT DETECTED NOT DETECTED Final   Parainfluenza Virus 1 NOT DETECTED NOT DETECTED Final   Parainfluenza Virus 2 NOT DETECTED NOT DETECTED Final   Parainfluenza Virus 3 NOT DETECTED NOT DETECTED Final   Parainfluenza Virus 4 NOT DETECTED NOT DETECTED Final   Respiratory Syncytial Virus NOT DETECTED NOT DETECTED Final   Bordetella pertussis NOT DETECTED NOT DETECTED Final    Bordetella Parapertussis NOT DETECTED NOT DETECTED Final   Chlamydophila pneumoniae NOT DETECTED NOT DETECTED Final   Mycoplasma pneumoniae NOT DETECTED NOT DETECTED Final    Comment: Performed at [facility], [address] [place], [place] [address]     Scheduled Meds:  acetaminophen  1,000 mg Oral Q8H   albuterol  2.5 mg Nebulization Q6H   amLODipine  5 mg Oral Daily   arformoterol  15 mcg Nebulization BID   budesonide (PULMICORT) nebulizer solution  0.5 mg Nebulization BID   dexamethasone (DECADRON) injection  8 mg Intravenous Q12H   enoxaparin (LOVENOX) injection  40 mg Subcutaneous Q24H   Ensifentrine  2.5 mL Inhalation BID   guaiFENesin  1,200 mg Oral BID   montelukast  10 mg Oral Daily   oseltamivir  75 mg Oral BID   pantoprazole  40 mg Oral Daily   primidone  200 mg Oral BID   revefenacin  175 mcg Nebulization Daily   Continuous Infusions:  Procedures/Studies: US THYROID Result Date: 07/27/23 CLINICAL DATA:  Nodules noted on CT chest EXAM: THYROID ULTRASOUND TECHNIQUE: Ultrasound examination of the thyroid gland and adjacent soft tissues was performed. COMPARISON:  11/25/2020 FINDINGS: Parenchymal Echotexture: Moderately heterogenous Isthmus: 0.7 cm thickness, previously 0.2 Right lobe: 5.5 x 2.1 x 1.5 cm, previously 7.2 x 1.5 x 1.4 Left lobe: 7.2 x 3.5 x 3.3 cm, previously 8.2 x 2.9 x 3.3 _________________________________________________________ Estimated total number of nodules >/= 1 cm: 5 Number of spongiform nodules >/=  2 cm not described below (TR1): 0 Number of mixed cystic and solid nodules >/= 1.5 cm not described below (TR2): 0 _________________________________________________________ Nodule # 1: 1.1 cm complex cyst, medial right, previous 0.8; TR2, this nodule does NOT meet TI-RADS criteria for biopsy or dedicated follow-up. Nodule # 2: Ill-defined 1.4 x 0.8 x 0.7 cm isoechoic region, possibly pseudonodule Nodule # 3: 0.8 cm complex cyst, superior left,  previously 0.9; This nodule does NOT meet TI-RADS criteria for biopsy or dedicated follow-up. Nodule # 4: Prior biopsy: No Location: Left; mid Maximum size: 1.4 cm; Other 2 dimensions: 0.9 x 1.2 cm, previously, 1.1 x 0.9 x 0.9 cm Composition: solid/almost completely solid (2) Echogenicity: isoechoic (1) Shape: not taller-than-wide (0) Margins: smooth (0) Echogenic foci: none (0) ACR TI-RADS total points: 3. ACR TI-RADS risk category:  TR 3. Significant change in size (>/= 20% in two dimensions and minimal increase of 2 mm): No Change in features: No Change in ACR TI-RADS risk category: No ACR TI-RADS recommendations: Given size (<1.4 cm) and appearance, this nodule does NOT meet TI-RADS criteria for biopsy or dedicated follow-up. _________________________________________________________ Nodule 5: 1 cm mildly complex cyst, medial left, previously 0.8 cm; This nodule does NOT meet TI-RADS criteria for biopsy or dedicated follow-up. Nodule # 6: Prior biopsy: No Location: Left; inferior Maximum size: 4.2 cm; Other 2 dimensions: 2.8 x 3.1 cm, previously, 4.1 x 2.3 x 3 cm Composition: solid/almost completely solid (2) Echogenicity: isoechoic (1) Shape: not taller-than-wide (0) Margins: ill-defined (0) Echogenic foci: none (0) ACR TI-RADS total points: 3. ACR TI-RADS risk category:  TR 3. Significant change in size (>/= 20% in two dimensions and minimal increase of 2 mm): No Change in features: No Change in ACR TI-RADS risk category: No ACR TI-RADS recommendations: **Given size (>/= 2.5 cm) and appearance, fine needle aspiration of this mildly suspicious nodule should be considered based on TI-RADS criteria. _________________________________________________________ No regional cervical adenopathy identified. IMPRESSION: 1. Multinodular goiter. 2. 4.2 cm left inferior TR 3 nodule meets criteria for FNA. Electronically Signed   By: Corlis Leak M.D.   On: July 27, 2023 09:30   CT Chest W Contrast Result Date:  07/11/2023 CLINICAL DATA:  Abnormal x-ray. EXAM: CT CHEST WITH CONTRAST TECHNIQUE: Multidetector CT imaging of the chest was performed during intravenous contrast administration. RADIATION DOSE REDUCTION: This exam was performed according to the departmental dose-optimization program which includes automated exposure control, adjustment of the mA and/or kV according to patient size and/or use of iterative reconstruction technique. CONTRAST:  75mL OMNIPAQUE IOHEXOL 300 MG/ML  SOLN COMPARISON:  Chest x-ray earlier 07/11/2023.  Chest CT 04/20/2023. FINDINGS: Cardiovascular: Thoracic aorta is normal course and caliber with slight atherosclerotic calcified plaque. Heart is nonenlarged. No pericardial effusion. Mediastinum/Nodes: Slightly patulous thoracic esophagus. Left-sided thyroid nodule once again seen measuring 3.8 cm. Please correlate for any prior  workup. This may be increasing. Please correlate with prior thyroid ultrasound from 11/25/2020. Prominent right axillary node today measures 9 mm in short axis. Previously same node measured 6 mm. Also slight increasing in small left-sided axillary nodes. There are some enlarged hilar nodes. Example on the right measures 15 by 12 mm. Previously 16 by 12 mm. Small left hilar nodes. There are some prominent mediastinal nodes identified which are similar to previous. Lungs/Pleura: Scattered centrilobular and paraseptal emphysematous change. No frank consolidation, pneumothorax or effusion. Previously there were areas of ground-glass interstitial changes along the anterior inferior left upper lobe and right upper lobe. These areas are slightly improved from previous. There also areas previously in the middle lobe, lingula and lower lobes which have shown significant improvement as well but there is significant residual particularly along the lower lobes and inferior middle lobe and lingula. Some of the areas of opacity are somewhat nodular, for example right lower lobe  medially measuring 8 mm on series 3, image 107. Previously 9 mm. Other areas are also similar. Upper Abdomen: Adrenal glands are preserved in the upper abdomen. Stable benign-appearing right-sided renal cysts. Musculoskeletal: Healing left-sided rib fractures are identified of the seventh and ninth ribs. These were not seen on the prior CT. Please correlate with history. Stable sclerotic lesion along the right second rib, possible bone island. IMPRESSION: Previous areas of interstitial, ground-glass and consolidative lung opacities are intervally improved from November 2024 but there is significant residual particularly along the lower lung zones. There are also some stable areas of noncalcified lung nodularity. Recommend continued surveillance in 3 months. Few lymph nodes that are enlarged in the mediastinum and hilum are similar. There are some small not pathologic nodes identified in the axillary regions but are slightly larger and again attention on follow-up. Left thyroid nodule identified which may be increasing going back to 2022. Please correlate with prior workup and if needed a repeat ultrasound when clinically appropriate to assess for change. Please correlate with any prior FNA results as an aggressive lesion is possible by imaging. Emphysema (ICD10-J43.9). Electronically Signed   By: Karen Kays M.D.   On: 07/11/2023 16:40   DG Chest 2 View Result Date: 07/11/2023 CLINICAL DATA:  Chest pain and shortness of breath. EXAM: CHEST - 2 VIEW COMPARISON:  Chest radiograph dated 05/30/2023. FINDINGS: The heart size and mediastinal contours are within normal limits. Decreased bibasilar opacities with mild right-greater-than-left hazy basilar opacities. No new focal consolidation, pleural effusion or pneumothorax. No acute osseous abnormality. IMPRESSION: Mild right-greater-than-left hazy basilar opacities, significantly decreased since the prior exam. No new focal consolidation. Electronically Signed   By:  Hart Robinsons M.D.   On: 07/11/2023 13:35    Catarina Hartshorn, DO  [facility]  If 7PM-7AM, please contact night-coverage [email or web address] Password [facility] 07/14/2023, 6:38 PM   LOS: 1 day

## 2023-07-15 DIAGNOSIS — D824 Hyperimmunoglobulin E [IgE] syndrome: Secondary | ICD-10-CM | POA: Diagnosis not present

## 2023-07-15 DIAGNOSIS — J9601 Acute respiratory failure with hypoxia: Secondary | ICD-10-CM | POA: Diagnosis not present

## 2023-07-15 DIAGNOSIS — J441 Chronic obstructive pulmonary disease with (acute) exacerbation: Secondary | ICD-10-CM | POA: Diagnosis not present

## 2023-07-15 NOTE — Plan of Care (Signed)
   Problem: Health Behavior/Discharge Planning: Goal: Ability to manage health-related needs will improve Outcome: Progressing

## 2023-07-15 NOTE — Progress Notes (Signed)
 PROGRESS NOTE  Drew Sandoval ZOX:[id number] DOB: 09-Nov-1975 DOA: 07/11/2023 PCP: Pcp, No  Brief History:  48 y.o. male former smoker with medical history significant for severe asthma and hypertension.  He started smoking at age 59.  Patient presented to the ED with complaints of difficulty breathing with associated productive cough.  Symptoms have been ongoing and progressing over the past 2 to 3 weeks.  Reports symptoms started after he completed his last dose of antibiotics at home.  Reports chronic left-sided chest pain that is nonradiating.  Reports poor oral intake, no vomiting or diarrhea.  Does not smoke cigarettes anymore.   Recent hospitalization 1/25 to 1/27 for acute hypoxic respiratory failure secondary to pneumonia and asthma exacerbation.  Was treated with Levaquin was continued on discharge, but tolerated an initial dose of cefepime in the ED.  He followed up with pulmonologist Dr. Sherene Sires on discharge as recommended.   ED Course: Tmax 98.6.  Heart rate 60s to 70s.  Respiratory rate 15-20.  Blood pressure systolic 140s to 811B.  O2 sats 91% on room air, placed on 2 L.  WBC 6.6.  Troponin 4. Dexamethasone 4 mg given.  DuoNebs. CT chest with contrast-previous areas of interstitial groundglass and consolidative lung opacities have improved from November 2024, but there is significant residual particularly in lower lung zones.  Mediastinal and hilar lymph nodes similar to prior follow-up recommended.  Increasing left thyroid nodule found that will need further work up.  He was admitted with acute asthma exacerbation.   Assessment/Plan: Acute respiratory failure with hypoxia -Secondary to asthma exacerbation -CT chest as discussed above -Stable on 3 L -Viral respiratory panel +flu A/H1 -Wean oxygen as tolerated   Asthma/COPD Exacerbation--Influenza pneumonitis -continue albuterol neb -add yupelri -started pulmicort and brovana -increase pulmicort to 0.5 mg bid -continue  singulair -continue IV dexamethasone -COVID-19/RSV/Flu--neg -continue ensifentrine  -PCT <0.10 -start oseltamivir -add hycodan for cough   Essential Tremor -continue mysoline   Hypertension -continue amlodipine   Multinodular goiter -needs outpt follow up  -3/4 thyroid US--multinodular goiter -TSH 0.311 -check Free T4--0.95   Hx SVT s/p ablation -currently in sinus                 Family Communication: no  Family at bedside   Consultants:  none   Code Status:  FULL    DVT Prophylaxis:   [place] Lovenox           Subjective: Pt feels that his breathing is getting better for the first day.  Denies cp, n/v/d, abd pain.  Objective: Vitals:   07/14/23 2028 07/15/23 0639 07/15/23 0847 07/15/23 1355  BP: (!) 144/92 (!) 161/91  (!) 150/96  Pulse: 72 78  89  Resp:  (!) 22  17  Temp: (!) 97.5 F (36.4 C) 98 F (36.7 C)  98.2 F (36.8 C)  TempSrc: Oral Oral    SpO2: 96% 95% 94% 91%  Weight:      Height:        Intake/Output Summary (Last 24 hours) at 07/15/2023 1809 Last data filed at 07/15/2023 1700 Gross per 24 hour  Intake 500 ml  Output --  Net 500 ml   Weight change:  Exam:  General:  Pt is alert, follows commands appropriately, not in acute distress HEENT: No icterus, No thrush, No neck mass, [place]/AT Cardiovascular: RRR, S1/S2, no rubs, no gallops Respiratory: scattered bilateral rales.  Mild Bibasilar wheeze. Abdomen: Soft/+BS, non tender, non  distended, no guarding Extremities: No edema, No lymphangitis, No petechiae, No rashes, no synovitis   Data Reviewed: I have personally reviewed following labs and imaging studies Basic Metabolic Panel: Recent Labs  Lab 07/11/23 1236 07/14/23 0437  NA 139 135  K 3.9 3.6  CL 109 108  CO2 22 23  GLUCOSE 114* 120*  BUN 15 18  CREATININE 0.73 0.67  CALCIUM 8.5* 8.5*  MG  --  1.8  PHOS  --  3.4   Liver Function Tests: Recent Labs  Lab 07/11/23 1236  AST 14*  ALT 19  ALKPHOS 62  BILITOT 0.4   PROT 8.2*  ALBUMIN 3.2*   No results for input(s): "LIPASE", "AMYLASE" in the last 168 hours. No results for input(s): "AMMONIA" in the last 168 hours. Coagulation Profile: No results for input(s): "INR", "PROTIME" in the last 168 hours. CBC: Recent Labs  Lab 07/11/23 1236 07/12/23 0333  WBC 6.6 11.5*  NEUTROABS 4.7  --   HGB 13.3 12.6*  HCT 41.0 38.5*  MCV 93.2 93.2  PLT 244 223   Cardiac Enzymes: No results for input(s): "CKTOTAL", "CKMB", "CKMBINDEX", "TROPONINI" in the last 168 hours. BNP: Invalid input(s): "POCBNP" CBG: No results for input(s): "GLUCAP" in the last 168 hours. HbA1C: No results for input(s): "HGBA1C" in the last 72 hours. Urine analysis:    Component Value Date/Time   COLORURINE BROWN (A) 06/13/2021 0001   APPEARANCEUR CLEAR 06/13/2021 0001   LABSPEC >1.030 (H) 06/13/2021 0001   PHURINE 5.5 06/13/2021 0001   GLUCOSEU NEGATIVE 06/13/2021 0001   HGBUR LARGE (A) 06/13/2021 0001   BILIRUBINUR SMALL (A) 06/13/2021 0001   BILIRUBINUR negative 06/11/2021 0844   KETONESUR NEGATIVE 06/13/2021 0001   PROTEINUR 30 (A) 06/13/2021 0001   UROBILINOGEN 0.2 06/11/2021 0844   UROBILINOGEN 0.2 06/24/2011 0858   NITRITE NEGATIVE 06/13/2021 0001   LEUKOCYTESUR NEGATIVE 06/13/2021 0001   Sepsis Labs: @LABRCNTIP (procalcitonin:4,lacticidven:4) ) Recent Results (from the past 240 hours)  Resp panel by RT-PCR (RSV, Flu A&B, Covid) Anterior Nasal Swab     Status: None   Collection Time: 07/11/23  7:07 PM   Specimen: Anterior Nasal Swab  Result Value Ref Range Status   SARS Coronavirus 2 by RT PCR NEGATIVE NEGATIVE Final    Comment: (NOTE) SARS-CoV-2 target nucleic acids are NOT DETECTED.  The SARS-CoV-2 RNA is generally detectable in upper respiratory specimens during the acute phase of infection. The lowest concentration of SARS-CoV-2 viral copies this assay can detect is 138 copies/mL. A negative result does not preclude SARS-Cov-2 infection and should not  be used as the sole basis for treatment or other patient management decisions. A negative result may occur with  improper specimen collection/handling, submission of specimen other than nasopharyngeal swab, presence of viral mutation(s) within the areas targeted by this assay, and inadequate number of viral copies(<138 copies/mL). A negative result must be combined with clinical observations, patient history, and epidemiological information. The expected result is Negative.  Fact Sheet for Patients:  BloggerCourse.com  Fact Sheet for Healthcare Providers:  SeriousBroker.it  This test is no t yet approved or cleared by the [place] FDA and  has been authorized for detection and/or diagnosis of SARS-CoV-2 by FDA under an Emergency Use Authorization (EUA). This EUA will remain  in effect (meaning this test can be used) for the duration of the COVID-19 declaration under Section 564(b)(1) of the Act, 21 U.S.C.section 360bbb-3(b)(1), unless the authorization is terminated  or revoked sooner.  Influenza A by PCR NEGATIVE NEGATIVE Final   Influenza B by PCR NEGATIVE NEGATIVE Final    Comment: (NOTE) The Xpert Xpress SARS-CoV-2/FLU/RSV plus assay is intended as an aid in the diagnosis of influenza from Nasopharyngeal swab specimens and should not be used as a sole basis for treatment. Nasal washings and aspirates are unacceptable for Xpert Xpress SARS-CoV-2/FLU/RSV testing.  Fact Sheet for Patients: BloggerCourse.com  Fact Sheet for Healthcare Providers: SeriousBroker.it  This test is not yet approved or cleared by the [place] FDA and has been authorized for detection and/or diagnosis of SARS-CoV-2 by FDA under an Emergency Use Authorization (EUA). This EUA will remain in effect (meaning this test can be used) for the duration of the COVID-19 declaration under Section  564(b)(1) of the Act, 21 U.S.C. section 360bbb-3(b)(1), unless the authorization is terminated or revoked.     Resp Syncytial Virus by PCR NEGATIVE NEGATIVE Final    Comment: (NOTE) Fact Sheet for Patients: BloggerCourse.com  Fact Sheet for Healthcare Providers: SeriousBroker.it  This test is not yet approved or cleared by the [place] FDA and has been authorized for detection and/or diagnosis of SARS-CoV-2 by FDA under an Emergency Use Authorization (EUA). This EUA will remain in effect (meaning this test can be used) for the duration of the COVID-19 declaration under Section 564(b)(1) of the Act, 21 U.S.C. section 360bbb-3(b)(1), unless the authorization is terminated or revoked.  Performed at [facility], [address], [place], [place] [address]   Respiratory (~20 pathogens) panel by PCR     Status: Abnormal   Collection Time: 07/13/23  1:55 PM   Specimen: Nasopharyngeal Swab; Respiratory  Result Value Ref Range Status   Adenovirus NOT DETECTED NOT DETECTED Final   Coronavirus 229E NOT DETECTED NOT DETECTED Final    Comment: (NOTE) The Coronavirus on the Respiratory Panel, DOES NOT test for the novel  Coronavirus (2019 nCoV)    Coronavirus HKU1 NOT DETECTED NOT DETECTED Final   Coronavirus NL63 NOT DETECTED NOT DETECTED Final   Coronavirus OC43 NOT DETECTED NOT DETECTED Final   Metapneumovirus NOT DETECTED NOT DETECTED Final   Rhinovirus / Enterovirus NOT DETECTED NOT DETECTED Final   Influenza A H1 2009 DETECTED (A) NOT DETECTED Final   Influenza B NOT DETECTED NOT DETECTED Final   Parainfluenza Virus 1 NOT DETECTED NOT DETECTED Final   Parainfluenza Virus 2 NOT DETECTED NOT DETECTED Final   Parainfluenza Virus 3 NOT DETECTED NOT DETECTED Final   Parainfluenza Virus 4 NOT DETECTED NOT DETECTED Final   Respiratory Syncytial Virus NOT DETECTED NOT DETECTED Final   Bordetella pertussis NOT DETECTED NOT DETECTED  Final   Bordetella Parapertussis NOT DETECTED NOT DETECTED Final   Chlamydophila pneumoniae NOT DETECTED NOT DETECTED Final   Mycoplasma pneumoniae NOT DETECTED NOT DETECTED Final    Comment: Performed at [facility], [address] [place], [place] [address]     Scheduled Meds:  acetaminophen  1,000 mg Oral Q8H   albuterol  2.5 mg Nebulization Q6H   amLODipine  5 mg Oral Daily   arformoterol  15 mcg Nebulization BID   budesonide (PULMICORT) nebulizer solution  0.5 mg Nebulization BID   dexamethasone (DECADRON) injection  8 mg Intravenous Q12H   enoxaparin (LOVENOX) injection  40 mg Subcutaneous Q24H   Ensifentrine  2.5 mL Inhalation BID   guaiFENesin  1,200 mg Oral BID   magnesium oxide  400 mg Oral Daily   montelukast  10 mg Oral Daily   oseltamivir  75 mg Oral BID   pantoprazole  40 mg Oral Daily   primidone  200 mg Oral BID   revefenacin  175 mcg Nebulization Daily   Continuous Infusions:  Procedures/Studies: US THYROID Result Date: August 11, 2023 CLINICAL DATA:  Nodules noted on CT chest EXAM: THYROID ULTRASOUND TECHNIQUE: Ultrasound examination of the thyroid gland and adjacent soft tissues was performed. COMPARISON:  11/25/2020 FINDINGS: Parenchymal Echotexture: Moderately heterogenous Isthmus: 0.7 cm thickness, previously 0.2 Right lobe: 5.5 x 2.1 x 1.5 cm, previously 7.2 x 1.5 x 1.4 Left lobe: 7.2 x 3.5 x 3.3 cm, previously 8.2 x 2.9 x 3.3 _________________________________________________________ Estimated total number of nodules >/= 1 cm: 5 Number of spongiform nodules >/=  2 cm not described below (TR1): 0 Number of mixed cystic and solid nodules >/= 1.5 cm not described below (TR2): 0 _________________________________________________________ Nodule # 1: 1.1 cm complex cyst, medial right, previous 0.8; TR2, this nodule does NOT meet TI-RADS criteria for biopsy or dedicated follow-up. Nodule # 2: Ill-defined 1.4 x 0.8 x 0.7 cm isoechoic region, possibly pseudonodule Nodule #  3: 0.8 cm complex cyst, superior left, previously 0.9; This nodule does NOT meet TI-RADS criteria for biopsy or dedicated follow-up. Nodule # 4: Prior biopsy: No Location: Left; mid Maximum size: 1.4 cm; Other 2 dimensions: 0.9 x 1.2 cm, previously, 1.1 x 0.9 x 0.9 cm Composition: solid/almost completely solid (2) Echogenicity: isoechoic (1) Shape: not taller-than-wide (0) Margins: smooth (0) Echogenic foci: none (0) ACR TI-RADS total points: 3. ACR TI-RADS risk category:  TR 3. Significant change in size (>/= 20% in two dimensions and minimal increase of 2 mm): No Change in features: No Change in ACR TI-RADS risk category: No ACR TI-RADS recommendations: Given size (<1.4 cm) and appearance, this nodule does NOT meet TI-RADS criteria for biopsy or dedicated follow-up. _________________________________________________________ Nodule 5: 1 cm mildly complex cyst, medial left, previously 0.8 cm; This nodule does NOT meet TI-RADS criteria for biopsy or dedicated follow-up. Nodule # 6: Prior biopsy: No Location: Left; inferior Maximum size: 4.2 cm; Other 2 dimensions: 2.8 x 3.1 cm, previously, 4.1 x 2.3 x 3 cm Composition: solid/almost completely solid (2) Echogenicity: isoechoic (1) Shape: not taller-than-wide (0) Margins: ill-defined (0) Echogenic foci: none (0) ACR TI-RADS total points: 3. ACR TI-RADS risk category:  TR 3. Significant change in size (>/= 20% in two dimensions and minimal increase of 2 mm): No Change in features: No Change in ACR TI-RADS risk category: No ACR TI-RADS recommendations: **Given size (>/= 2.5 cm) and appearance, fine needle aspiration of this mildly suspicious nodule should be considered based on TI-RADS criteria. _________________________________________________________ No regional cervical adenopathy identified. IMPRESSION: 1. Multinodular goiter. 2. 4.2 cm left inferior TR 3 nodule meets criteria for FNA. Electronically Signed   By: Corlis Leak M.D.   On: 2023/08/11 09:30   CT Chest W  Contrast Result Date: 07/11/2023 CLINICAL DATA:  Abnormal x-ray. EXAM: CT CHEST WITH CONTRAST TECHNIQUE: Multidetector CT imaging of the chest was performed during intravenous contrast administration. RADIATION DOSE REDUCTION: This exam was performed according to the departmental dose-optimization program which includes automated exposure control, adjustment of the mA and/or kV according to patient size and/or use of iterative reconstruction technique. CONTRAST:  75mL OMNIPAQUE IOHEXOL 300 MG/ML  SOLN COMPARISON:  Chest x-ray earlier 07/11/2023.  Chest CT 04/20/2023. FINDINGS: Cardiovascular: Thoracic aorta is normal course and caliber with slight atherosclerotic calcified plaque. Heart is nonenlarged. No pericardial effusion. Mediastinum/Nodes: Slightly patulous thoracic esophagus. Left-sided thyroid nodule once again seen  measuring 3.8 cm. Please correlate for any prior workup. This may be increasing. Please correlate with prior thyroid ultrasound from 11/25/2020. Prominent right axillary node today measures 9 mm in short axis. Previously same node measured 6 mm. Also slight increasing in small left-sided axillary nodes. There are some enlarged hilar nodes. Example on the right measures 15 by 12 mm. Previously 16 by 12 mm. Small left hilar nodes. There are some prominent mediastinal nodes identified which are similar to previous. Lungs/Pleura: Scattered centrilobular and paraseptal emphysematous change. No frank consolidation, pneumothorax or effusion. Previously there were areas of ground-glass interstitial changes along the anterior inferior left upper lobe and right upper lobe. These areas are slightly improved from previous. There also areas previously in the middle lobe, lingula and lower lobes which have shown significant improvement as well but there is significant residual particularly along the lower lobes and inferior middle lobe and lingula. Some of the areas of opacity are somewhat nodular, for example  right lower lobe medially measuring 8 mm on series 3, image 107. Previously 9 mm. Other areas are also similar. Upper Abdomen: Adrenal glands are preserved in the upper abdomen. Stable benign-appearing right-sided renal cysts. Musculoskeletal: Healing left-sided rib fractures are identified of the seventh and ninth ribs. These were not seen on the prior CT. Please correlate with history. Stable sclerotic lesion along the right second rib, possible bone island. IMPRESSION: Previous areas of interstitial, ground-glass and consolidative lung opacities are intervally improved from November 2024 but there is significant residual particularly along the lower lung zones. There are also some stable areas of noncalcified lung nodularity. Recommend continued surveillance in 3 months. Few lymph nodes that are enlarged in the mediastinum and hilum are similar. There are some small not pathologic nodes identified in the axillary regions but are slightly larger and again attention on follow-up. Left thyroid nodule identified which may be increasing going back to 2022. Please correlate with prior workup and if needed a repeat ultrasound when clinically appropriate to assess for change. Please correlate with any prior FNA results as an aggressive lesion is possible by imaging. Emphysema (ICD10-J43.9). Electronically Signed   By: Karen Kays M.D.   On: 07/11/2023 16:40   DG Chest 2 View Result Date: 07/11/2023 CLINICAL DATA:  Chest pain and shortness of breath. EXAM: CHEST - 2 VIEW COMPARISON:  Chest radiograph dated 05/30/2023. FINDINGS: The heart size and mediastinal contours are within normal limits. Decreased bibasilar opacities with mild right-greater-than-left hazy basilar opacities. No new focal consolidation, pleural effusion or pneumothorax. No acute osseous abnormality. IMPRESSION: Mild right-greater-than-left hazy basilar opacities, significantly decreased since the prior exam. No new focal consolidation.  Electronically Signed   By: Hart Robinsons M.D.   On: 07/11/2023 13:35    Catarina Hartshorn, DO  [facility]  If 7PM-7AM, please contact night-coverage [email or web address] Password [email or web address] 07/15/2023, 6:09 PM   LOS: 2 days

## 2023-07-15 NOTE — Plan of Care (Signed)
  Problem: Education: Goal: Knowledge of General Education information will improve Description: Including pain rating scale, medication(s)/side effects and non-pharmacologic comfort measures Outcome: Not Progressing   Problem: Health Behavior/Discharge Planning: Goal: Ability to manage health-related needs will improve Outcome: Not Progressing   Problem: Clinical Measurements: Goal: Ability to maintain clinical measurements within normal limits will improve Outcome: Not Progressing Goal: Will remain free from infection Outcome: Not Progressing Goal: Diagnostic test results will improve Outcome: Not Progressing

## 2023-07-15 NOTE — Progress Notes (Signed)
 Patient requested not to be woken at 0200 for breathing treatment. I had walked by his room at 0220 and heard him coughing. I asked him did he want his breathing treatment but he stated he did not. Respiratory therapist is aware of his request.

## 2023-07-16 DIAGNOSIS — J9601 Acute respiratory failure with hypoxia: Secondary | ICD-10-CM | POA: Diagnosis not present

## 2023-07-16 DIAGNOSIS — J441 Chronic obstructive pulmonary disease with (acute) exacerbation: Secondary | ICD-10-CM | POA: Diagnosis not present

## 2023-07-16 MED ORDER — BUDESONIDE 0.25 MG/2ML IN SUSP
RESPIRATORY_TRACT | Status: AC
  Filled 2023-07-16: qty 2

## 2023-07-16 NOTE — Progress Notes (Signed)
 PROGRESS NOTE  Drew Sandoval WUJ:[id number] DOB: 1976/01/01 DOA: 07/11/2023 PCP: Pcp, No  Brief History:  48 y.o. male former smoker with medical history significant for severe asthma and hypertension.  He started smoking at age 44.  Patient presented to the ED with complaints of difficulty breathing with associated productive cough.  Symptoms have been ongoing and progressing over the past 2 to 3 weeks.  Reports symptoms started after he completed his last dose of antibiotics at home.  Reports chronic left-sided chest pain that is nonradiating.  Reports poor oral intake, no vomiting or diarrhea.  Does not smoke cigarettes anymore.   Recent hospitalization 1/25 to 1/27 for acute hypoxic respiratory failure secondary to pneumonia and asthma exacerbation.  Was treated with Levaquin was continued on discharge, but tolerated an initial dose of cefepime in the ED.  He followed up with pulmonologist Dr. Sherene Sires on discharge as recommended.   ED Course: Tmax 98.6.  Heart rate 60s to 70s.  Respiratory rate 15-20.  Blood pressure systolic 140s to 956O.  O2 sats 91% on room air, placed on 2 L.  WBC 6.6.  Troponin 4. Dexamethasone 4 mg given.  DuoNebs. CT chest with contrast-previous areas of interstitial groundglass and consolidative lung opacities have improved from November 2024, but there is significant residual particularly in lower lung zones.  Mediastinal and hilar lymph nodes similar to prior follow-up recommended.  Increasing left thyroid nodule found that will need further work up.  He was admitted with acute asthma exacerbation.   Assessment/Plan: Acute respiratory failure with hypoxia -Secondary to asthma exacerbation -CT chest as discussed above -Stable on 3 L>>2L -Viral respiratory panel +flu A/H1 -Wean oxygen as tolerated   Asthma/COPD Exacerbation--Influenza pneumonitis -continue albuterol neb -added yupelri -started pulmicort and brovana -increase pulmicort to 0.5 mg  bid -continue singulair -continue IV dexamethasone -COVID-19/RSV/Flu--neg -continue ensifentrine  -PCT <0.10 -started oseltamivir -continue hycodan for cough   Essential Tremor -continue mysoline   Hypertension -continue amlodipine   Multinodular goiter -needs outpt follow up  -3/4 thyroid US--multinodular goiter -TSH 0.311 -check Free T4--0.95   Hx SVT s/p ablation -currently in sinus                 Family Communication: no  Family at bedside   Consultants:  none   Code Status:  FULL    DVT Prophylaxis:   [place] Lovenox             Subjective: Pt is gradually breathing better.  "I'm still wheezing today"  denies f/c, cp, n/v/d, abd pain  Objective: Vitals:   07/16/23 0433 07/16/23 0915 07/16/23 0927 07/16/23 1204  BP: (!) 147/85 (!) 151/78  (!) 143/79  Pulse: 60   63  Resp: 16   17  Temp: 98 F (36.7 C)   98.6 F (37 C)  TempSrc: Oral     SpO2: 94%  95% 92%  Weight:      Height:        Intake/Output Summary (Last 24 hours) at 07/16/2023 1454 Last data filed at 07/16/2023 1300 Gross per 24 hour  Intake 1020 ml  Output --  Net 1020 ml   Weight change:  Exam:  General:  Pt is alert, follows commands appropriately, not in acute distress HEENT: No icterus, No thrush, No neck mass, [place]/AT Cardiovascular: RRR, S1/S2, no rubs, no gallops Respiratory: bibasilar rales.  Bibasilar wheeze Abdomen: Soft/+BS, non tender, non distended, no guarding Extremities: No edema,  No lymphangitis, No petechiae, No rashes, no synovitis   Data Reviewed: I have personally reviewed following labs and imaging studies Basic Metabolic Panel: Recent Labs  Lab 07/11/23 1236 07/14/23 0437  NA 139 135  K 3.9 3.6  CL 109 108  CO2 22 23  GLUCOSE 114* 120*  BUN 15 18  CREATININE 0.73 0.67  CALCIUM 8.5* 8.5*  MG  --  1.8  PHOS  --  3.4   Liver Function Tests: Recent Labs  Lab 07/11/23 1236  AST 14*  ALT 19  ALKPHOS 62  BILITOT 0.4  PROT 8.2*  ALBUMIN  3.2*   No results for input(s): "LIPASE", "AMYLASE" in the last 168 hours. No results for input(s): "AMMONIA" in the last 168 hours. Coagulation Profile: No results for input(s): "INR", "PROTIME" in the last 168 hours. CBC: Recent Labs  Lab 07/11/23 1236 07/12/23 0333  WBC 6.6 11.5*  NEUTROABS 4.7  --   HGB 13.3 12.6*  HCT 41.0 38.5*  MCV 93.2 93.2  PLT 244 223   Cardiac Enzymes: No results for input(s): "CKTOTAL", "CKMB", "CKMBINDEX", "TROPONINI" in the last 168 hours. BNP: Invalid input(s): "POCBNP" CBG: No results for input(s): "GLUCAP" in the last 168 hours. HbA1C: No results for input(s): "HGBA1C" in the last 72 hours. Urine analysis:    Component Value Date/Time   COLORURINE BROWN (A) 06/13/2021 0001   APPEARANCEUR CLEAR 06/13/2021 0001   LABSPEC >1.030 (H) 06/13/2021 0001   PHURINE 5.5 06/13/2021 0001   GLUCOSEU NEGATIVE 06/13/2021 0001   HGBUR LARGE (A) 06/13/2021 0001   BILIRUBINUR SMALL (A) 06/13/2021 0001   BILIRUBINUR negative 06/11/2021 0844   KETONESUR NEGATIVE 06/13/2021 0001   PROTEINUR 30 (A) 06/13/2021 0001   UROBILINOGEN 0.2 06/11/2021 0844   UROBILINOGEN 0.2 06/24/2011 0858   NITRITE NEGATIVE 06/13/2021 0001   LEUKOCYTESUR NEGATIVE 06/13/2021 0001   Sepsis Labs: @LABRCNTIP (procalcitonin:4,lacticidven:4) ) Recent Results (from the past 240 hours)  Resp panel by RT-PCR (RSV, Flu A&B, Covid) Anterior Nasal Swab     Status: None   Collection Time: 07/11/23  7:07 PM   Specimen: Anterior Nasal Swab  Result Value Ref Range Status   SARS Coronavirus 2 by RT PCR NEGATIVE NEGATIVE Final    Comment: (NOTE) SARS-CoV-2 target nucleic acids are NOT DETECTED.  The SARS-CoV-2 RNA is generally detectable in upper respiratory specimens during the acute phase of infection. The lowest concentration of SARS-CoV-2 viral copies this assay can detect is 138 copies/mL. A negative result does not preclude SARS-Cov-2 infection and should not be used as the sole  basis for treatment or other patient management decisions. A negative result may occur with  improper specimen collection/handling, submission of specimen other than nasopharyngeal swab, presence of viral mutation(s) within the areas targeted by this assay, and inadequate number of viral copies(<138 copies/mL). A negative result must be combined with clinical observations, patient history, and epidemiological information. The expected result is Negative.  Fact Sheet for Patients:  BloggerCourse.com  Fact Sheet for Healthcare Providers:  SeriousBroker.it  This test is no t yet approved or cleared by the [place] FDA and  has been authorized for detection and/or diagnosis of SARS-CoV-2 by FDA under an Emergency Use Authorization (EUA). This EUA will remain  in effect (meaning this test can be used) for the duration of the COVID-19 declaration under Section 564(b)(1) of the Act, 21 U.S.C.section 360bbb-3(b)(1), unless the authorization is terminated  or revoked sooner.       Influenza A by PCR NEGATIVE NEGATIVE  Final   Influenza B by PCR NEGATIVE NEGATIVE Final    Comment: (NOTE) The Xpert Xpress SARS-CoV-2/FLU/RSV plus assay is intended as an aid in the diagnosis of influenza from Nasopharyngeal swab specimens and should not be used as a sole basis for treatment. Nasal washings and aspirates are unacceptable for Xpert Xpress SARS-CoV-2/FLU/RSV testing.  Fact Sheet for Patients: BloggerCourse.com  Fact Sheet for Healthcare Providers: SeriousBroker.it  This test is not yet approved or cleared by the [place] FDA and has been authorized for detection and/or diagnosis of SARS-CoV-2 by FDA under an Emergency Use Authorization (EUA). This EUA will remain in effect (meaning this test can be used) for the duration of the COVID-19 declaration under Section 564(b)(1) of the Act,  21 U.S.C. section 360bbb-3(b)(1), unless the authorization is terminated or revoked.     Resp Syncytial Virus by PCR NEGATIVE NEGATIVE Final    Comment: (NOTE) Fact Sheet for Patients: BloggerCourse.com  Fact Sheet for Healthcare Providers: SeriousBroker.it  This test is not yet approved or cleared by the [place] FDA and has been authorized for detection and/or diagnosis of SARS-CoV-2 by FDA under an Emergency Use Authorization (EUA). This EUA will remain in effect (meaning this test can be used) for the duration of the COVID-19 declaration under Section 564(b)(1) of the Act, 21 U.S.C. section 360bbb-3(b)(1), unless the authorization is terminated or revoked.  Performed at [facility], [address], [place], [place] [address]   Respiratory (~20 pathogens) panel by PCR     Status: Abnormal   Collection Time: 07/13/23  1:55 PM   Specimen: Nasopharyngeal Swab; Respiratory  Result Value Ref Range Status   Adenovirus NOT DETECTED NOT DETECTED Final   Coronavirus 229E NOT DETECTED NOT DETECTED Final    Comment: (NOTE) The Coronavirus on the Respiratory Panel, DOES NOT test for the novel  Coronavirus (2019 nCoV)    Coronavirus HKU1 NOT DETECTED NOT DETECTED Final   Coronavirus NL63 NOT DETECTED NOT DETECTED Final   Coronavirus OC43 NOT DETECTED NOT DETECTED Final   Metapneumovirus NOT DETECTED NOT DETECTED Final   Rhinovirus / Enterovirus NOT DETECTED NOT DETECTED Final   Influenza A H1 2009 DETECTED (A) NOT DETECTED Final   Influenza B NOT DETECTED NOT DETECTED Final   Parainfluenza Virus 1 NOT DETECTED NOT DETECTED Final   Parainfluenza Virus 2 NOT DETECTED NOT DETECTED Final   Parainfluenza Virus 3 NOT DETECTED NOT DETECTED Final   Parainfluenza Virus 4 NOT DETECTED NOT DETECTED Final   Respiratory Syncytial Virus NOT DETECTED NOT DETECTED Final   Bordetella pertussis NOT DETECTED NOT DETECTED Final   Bordetella  Parapertussis NOT DETECTED NOT DETECTED Final   Chlamydophila pneumoniae NOT DETECTED NOT DETECTED Final   Mycoplasma pneumoniae NOT DETECTED NOT DETECTED Final    Comment: Performed at [facility], [address], [place], [place] [address]     Scheduled Meds:  acetaminophen  1,000 mg Oral Q8H   albuterol  2.5 mg Nebulization Q6H   amLODipine  5 mg Oral Daily   arformoterol  15 mcg Nebulization BID   budesonide (PULMICORT) nebulizer solution  0.5 mg Nebulization BID   dexamethasone (DECADRON) injection  8 mg Intravenous Q12H   enoxaparin (LOVENOX) injection  40 mg Subcutaneous Q24H   Ensifentrine  2.5 mL Inhalation BID   guaiFENesin  1,200 mg Oral BID   magnesium oxide  400 mg Oral Daily   montelukast  10 mg Oral Daily   oseltamivir  75 mg Oral BID  pantoprazole  40 mg Oral Daily   primidone  200 mg Oral BID   revefenacin  175 mcg Nebulization Daily   Continuous Infusions:  Procedures/Studies: US THYROID Result Date: July 16, 2023 CLINICAL DATA:  Nodules noted on CT chest EXAM: THYROID ULTRASOUND TECHNIQUE: Ultrasound examination of the thyroid gland and adjacent soft tissues was performed. COMPARISON:  11/25/2020 FINDINGS: Parenchymal Echotexture: Moderately heterogenous Isthmus: 0.7 cm thickness, previously 0.2 Right lobe: 5.5 x 2.1 x 1.5 cm, previously 7.2 x 1.5 x 1.4 Left lobe: 7.2 x 3.5 x 3.3 cm, previously 8.2 x 2.9 x 3.3 _________________________________________________________ Estimated total number of nodules >/= 1 cm: 5 Number of spongiform nodules >/=  2 cm not described below (TR1): 0 Number of mixed cystic and solid nodules >/= 1.5 cm not described below (TR2): 0 _________________________________________________________ Nodule # 1: 1.1 cm complex cyst, medial right, previous 0.8; TR2, this nodule does NOT meet TI-RADS criteria for biopsy or dedicated follow-up. Nodule # 2: Ill-defined 1.4 x 0.8 x 0.7 cm isoechoic region, possibly pseudonodule Nodule # 3: 0.8 cm complex  cyst, superior left, previously 0.9; This nodule does NOT meet TI-RADS criteria for biopsy or dedicated follow-up. Nodule # 4: Prior biopsy: No Location: Left; mid Maximum size: 1.4 cm; Other 2 dimensions: 0.9 x 1.2 cm, previously, 1.1 x 0.9 x 0.9 cm Composition: solid/almost completely solid (2) Echogenicity: isoechoic (1) Shape: not taller-than-wide (0) Margins: smooth (0) Echogenic foci: none (0) ACR TI-RADS total points: 3. ACR TI-RADS risk category:  TR 3. Significant change in size (>/= 20% in two dimensions and minimal increase of 2 mm): No Change in features: No Change in ACR TI-RADS risk category: No ACR TI-RADS recommendations: Given size (<1.4 cm) and appearance, this nodule does NOT meet TI-RADS criteria for biopsy or dedicated follow-up. _________________________________________________________ Nodule 5: 1 cm mildly complex cyst, medial left, previously 0.8 cm; This nodule does NOT meet TI-RADS criteria for biopsy or dedicated follow-up. Nodule # 6: Prior biopsy: No Location: Left; inferior Maximum size: 4.2 cm; Other 2 dimensions: 2.8 x 3.1 cm, previously, 4.1 x 2.3 x 3 cm Composition: solid/almost completely solid (2) Echogenicity: isoechoic (1) Shape: not taller-than-wide (0) Margins: ill-defined (0) Echogenic foci: none (0) ACR TI-RADS total points: 3. ACR TI-RADS risk category:  TR 3. Significant change in size (>/= 20% in two dimensions and minimal increase of 2 mm): No Change in features: No Change in ACR TI-RADS risk category: No ACR TI-RADS recommendations: **Given size (>/= 2.5 cm) and appearance, fine needle aspiration of this mildly suspicious nodule should be considered based on TI-RADS criteria. _________________________________________________________ No regional cervical adenopathy identified. IMPRESSION: 1. Multinodular goiter. 2. 4.2 cm left inferior TR 3 nodule meets criteria for FNA. Electronically Signed   By: Corlis Leak M.D.   On: 07-16-2023 09:30   CT Chest W Contrast Result  Date: 07/11/2023 CLINICAL DATA:  Abnormal x-ray. EXAM: CT CHEST WITH CONTRAST TECHNIQUE: Multidetector CT imaging of the chest was performed during intravenous contrast administration. RADIATION DOSE REDUCTION: This exam was performed according to the departmental dose-optimization program which includes automated exposure control, adjustment of the mA and/or kV according to patient size and/or use of iterative reconstruction technique. CONTRAST:  75mL OMNIPAQUE IOHEXOL 300 MG/ML  SOLN COMPARISON:  Chest x-ray earlier 07/11/2023.  Chest CT 04/20/2023. FINDINGS: Cardiovascular: Thoracic aorta is normal course and caliber with slight atherosclerotic calcified plaque. Heart is nonenlarged. No pericardial effusion. Mediastinum/Nodes: Slightly patulous thoracic esophagus. Left-sided thyroid nodule once again seen measuring 3.8 cm. Please correlate for  any prior workup. This may be increasing. Please correlate with prior thyroid ultrasound from 11/25/2020. Prominent right axillary node today measures 9 mm in short axis. Previously same node measured 6 mm. Also slight increasing in small left-sided axillary nodes. There are some enlarged hilar nodes. Example on the right measures 15 by 12 mm. Previously 16 by 12 mm. Small left hilar nodes. There are some prominent mediastinal nodes identified which are similar to previous. Lungs/Pleura: Scattered centrilobular and paraseptal emphysematous change. No frank consolidation, pneumothorax or effusion. Previously there were areas of ground-glass interstitial changes along the anterior inferior left upper lobe and right upper lobe. These areas are slightly improved from previous. There also areas previously in the middle lobe, lingula and lower lobes which have shown significant improvement as well but there is significant residual particularly along the lower lobes and inferior middle lobe and lingula. Some of the areas of opacity are somewhat nodular, for example right lower lobe  medially measuring 8 mm on series 3, image 107. Previously 9 mm. Other areas are also similar. Upper Abdomen: Adrenal glands are preserved in the upper abdomen. Stable benign-appearing right-sided renal cysts. Musculoskeletal: Healing left-sided rib fractures are identified of the seventh and ninth ribs. These were not seen on the prior CT. Please correlate with history. Stable sclerotic lesion along the right second rib, possible bone island. IMPRESSION: Previous areas of interstitial, ground-glass and consolidative lung opacities are intervally improved from November 2024 but there is significant residual particularly along the lower lung zones. There are also some stable areas of noncalcified lung nodularity. Recommend continued surveillance in 3 months. Few lymph nodes that are enlarged in the mediastinum and hilum are similar. There are some small not pathologic nodes identified in the axillary regions but are slightly larger and again attention on follow-up. Left thyroid nodule identified which may be increasing going back to 2022. Please correlate with prior workup and if needed a repeat ultrasound when clinically appropriate to assess for change. Please correlate with any prior FNA results as an aggressive lesion is possible by imaging. Emphysema (ICD10-J43.9). Electronically Signed   By: Karen Kays M.D.   On: 07/11/2023 16:40   DG Chest 2 View Result Date: 07/11/2023 CLINICAL DATA:  Chest pain and shortness of breath. EXAM: CHEST - 2 VIEW COMPARISON:  Chest radiograph dated 05/30/2023. FINDINGS: The heart size and mediastinal contours are within normal limits. Decreased bibasilar opacities with mild right-greater-than-left hazy basilar opacities. No new focal consolidation, pleural effusion or pneumothorax. No acute osseous abnormality. IMPRESSION: Mild right-greater-than-left hazy basilar opacities, significantly decreased since the prior exam. No new focal consolidation. Electronically Signed   By:  Hart Robinsons M.D.   On: 07/11/2023 13:35    Catarina Hartshorn, DO  [facility]  If 7PM-7AM, please contact night-coverage [email or web address] Password [email or web address] 07/16/2023, 2:54 PM   LOS: 3 days

## 2023-07-16 NOTE — Plan of Care (Signed)
  Problem: Health Behavior/Discharge Planning: Goal: Ability to manage health-related needs will improve Outcome: Progressing   Problem: Clinical Measurements: Goal: Ability to maintain clinical measurements within normal limits will improve Outcome: Progressing Goal: Will remain free from infection Outcome: Progressing Goal: Diagnostic test results will improve Outcome: Progressing Goal: Respiratory complications will improve Outcome: Progressing   Problem: Activity: Goal: Risk for activity intolerance will decrease Outcome: Progressing   Problem: Nutrition: Goal: Adequate nutrition will be maintained Outcome: Adequate for Discharge   Problem: Elimination: Goal: Will not experience complications related to bowel motility Outcome: Adequate for Discharge Goal: Will not experience complications related to urinary retention Outcome: Adequate for Discharge   Problem: Pain Managment: Goal: General experience of comfort will improve and/or be controlled Outcome: Adequate for Discharge   Problem: Safety: Goal: Ability to remain free from injury will improve Outcome: Progressing   Problem: Skin Integrity: Goal: Risk for impaired skin integrity will decrease Outcome: Progressing   Problem: Activity: Goal: Ability to tolerate increased activity will improve Outcome: Progressing   Problem: Clinical Measurements: Goal: Ability to maintain a body temperature in the normal range will improve Outcome: Progressing   Problem: Respiratory: Goal: Ability to maintain adequate ventilation will improve Outcome: Progressing Goal: Ability to maintain a clear airway will improve Outcome: Progressing

## 2023-07-16 NOTE — Plan of Care (Signed)
  Problem: Health Behavior/Discharge Planning: Goal: Ability to manage health-related needs will improve Outcome: Progressing   Problem: Clinical Measurements: Goal: Diagnostic test results will improve Outcome: Progressing   Problem: Clinical Measurements: Goal: Respiratory complications will improve Outcome: Progressing   Problem: Activity: Goal: Risk for activity intolerance will decrease Outcome: Progressing

## 2023-07-16 NOTE — Plan of Care (Signed)
  Problem: Health Behavior/Discharge Planning: Goal: Ability to manage health-related needs will improve Outcome: Progressing   Problem: Clinical Measurements: Goal: Ability to maintain clinical measurements within normal limits will improve Outcome: Progressing Goal: Will remain free from infection Outcome: Progressing Goal: Diagnostic test results will improve Outcome: Progressing Goal: Respiratory complications will improve Outcome: Progressing   Problem: Activity: Goal: Risk for activity intolerance will decrease Outcome: Progressing   Problem: Nutrition: Goal: Adequate nutrition will be maintained Outcome: Progressing   Problem: Elimination: Goal: Will not experience complications related to bowel motility Outcome: Progressing Goal: Will not experience complications related to urinary retention Outcome: Progressing   Problem: Pain Managment: Goal: General experience of comfort will improve and/or be controlled Outcome: Progressing   Problem: Safety: Goal: Ability to remain free from injury will improve Outcome: Progressing   Problem: Skin Integrity: Goal: Risk for impaired skin integrity will decrease Outcome: Progressing   Problem: Activity: Goal: Ability to tolerate increased activity will improve Outcome: Progressing   Problem: Clinical Measurements: Goal: Ability to maintain a body temperature in the normal range will improve Outcome: Progressing   Problem: Respiratory: Goal: Ability to maintain adequate ventilation will improve Outcome: Progressing Goal: Ability to maintain a clear airway will improve Outcome: Progressing

## 2023-07-16 NOTE — Plan of Care (Signed)
  Problem: Health Behavior/Discharge Planning: Goal: Ability to manage health-related needs will improve 07/16/2023 1900 by Vanita Panda, RN Outcome: Progressing 07/16/2023 1900 by Vanita Panda, RN Outcome: Progressing   Problem: Clinical Measurements: Goal: Diagnostic test results will improve 07/16/2023 1900 by Vanita Panda, RN Outcome: Progressing 07/16/2023 1900 by Vanita Panda, RN Outcome: Progressing   Problem: Clinical Measurements: Goal: Respiratory complications will improve 07/16/2023 1900 by Vanita Panda, RN Outcome: Progressing 07/16/2023 1900 by Vanita Panda, RN Outcome: Progressing   Problem: Activity: Goal: Risk for activity intolerance will decrease 07/16/2023 1900 by Vanita Panda, RN Outcome: Progressing 07/16/2023 1900 by Vanita Panda, RN Outcome: Progressing

## 2023-07-17 DIAGNOSIS — D824 Hyperimmunoglobulin E [IgE] syndrome: Secondary | ICD-10-CM | POA: Diagnosis not present

## 2023-07-17 DIAGNOSIS — J441 Chronic obstructive pulmonary disease with (acute) exacerbation: Secondary | ICD-10-CM | POA: Diagnosis not present

## 2023-07-17 DIAGNOSIS — J9601 Acute respiratory failure with hypoxia: Secondary | ICD-10-CM | POA: Diagnosis not present

## 2023-07-17 MED ORDER — DEXAMETHASONE 6 MG PO TABS
6.0000 mg | ORAL_TABLET | Freq: Every day | ORAL | 0 refills | Status: DC
Start: 2023-07-18 — End: 2023-07-28

## 2023-07-17 MED ORDER — DEXAMETHASONE 4 MG PO TABS: 6.0000 mg | ORAL_TABLET | Freq: Every day | ORAL | Status: DC

## 2023-07-17 NOTE — Discharge Summary (Signed)
 Physician Discharge Summary   Patient: Drew Sandoval MRN: [id number] DOB: 01-Feb-1976  Admit date:     07/11/2023  Discharge date: 07/17/23  Discharge Physician: Onalee Hua Giulia Hickey   PCP: Pcp, No   Recommendations at discharge:   Please follow up with primary care provider within 1-2 weeks  Please repeat BMP and CBC in one week     Hospital Course: 48 y.o. male former smoker with medical history significant for severe asthma and hypertension.  He started smoking at age 28.  Patient presented to the ED with complaints of difficulty breathing with associated productive cough.  Symptoms have been ongoing and progressing over the past 2 to 3 weeks.  Reports symptoms started after he completed his last dose of antibiotics at home.  Reports chronic left-sided chest pain that is nonradiating.  Reports poor oral intake, no vomiting or diarrhea.  Does not smoke cigarettes anymore.   Recent hospitalization 1/25 to 1/27 for acute hypoxic respiratory failure secondary to pneumonia and asthma exacerbation.  Was treated with Levaquin was continued on discharge, but tolerated an initial dose of cefepime in the ED.  He followed up with pulmonologist Dr. Sherene Sires on discharge as recommended.   ED Course: Tmax 98.6.  Heart rate 60s to 70s.  Respiratory rate 15-20.  Blood pressure systolic 140s to 409W.  O2 sats 91% on room air, placed on 2 L.  WBC 6.6.  Troponin 4. Dexamethasone 4 mg given.  DuoNebs. CT chest with contrast-previous areas of interstitial groundglass and consolidative lung opacities have improved from November 2024, but there is significant residual particularly in lower lung zones.  Mediastinal and hilar lymph nodes similar to prior follow-up recommended.  Increasing left thyroid nodule found that will need further work up.  He was admitted with acute asthma exacerbation.  Assessment and Plan: Acute respiratory failure with hypoxia -Secondary to asthma exacerbation -CT chest as discussed above -Stable on  3 L>>2L>>RA -Viral respiratory panel +flu A/H1 -Wean oxygen as tolerated -ambulated on RA on day of d/c without desaturation <95%   Asthma/COPD Exacerbation--Influenza pneumonitis -has 30 pack yrs tobacco -continue albuterol neb -added yupelri -started pulmicort and brovana -increase pulmicort to 0.5 mg bid -continue singulair -continue IV dexamethasone>>d/c home with po dexamethasone -COVID-19/RSV/Flu--neg -continue ensifentrine  -PCT <0.10 -started oseltamivir--finished 4/5 days in hospital -continue hycodan for cough   Essential Tremor -continue mysoline   Hypertension -continue amlodipine   Multinodular goiter -needs outpt follow up with endocrine  -3/4 thyroid US--multinodular goiter -TSH 0.311 -check Free T4--0.95   Hx SVT s/p ablation -currently in sinus        Consultants: none Procedures performed: none  Disposition: Home Diet recommendation:  Regular diet DISCHARGE MEDICATION: Allergies as of 07/17/2023       Reactions   Azithromycin Rash   Banana Anaphylaxis, Swelling   Throat swelling   Solu-medrol [methylprednisolone] Itching   States his whole body itches   Alvesco [ciclesonide] Rash   Anoro Ellipta [umeclidinium-vilanterol] Rash   Penicillins Rash        Medication List     STOP taking these medications    predniSONE 10 MG tablet Commonly known as: DELTASONE       TAKE these medications    acetaminophen 500 MG tablet Commonly known as: TYLENOL Take 1,000 mg by mouth every 6 (six) hours as needed for moderate pain.   albuterol 108 (90 Base) MCG/ACT inhaler Commonly known as: VENTOLIN HFA Inhale 2 puffs into the lungs every 6 (six) hours as needed for  wheezing or shortness of breath.   amLODipine 5 MG tablet Commonly known as: NORVASC TAKE 1 TABLET (5 MG TOTAL) BY MOUTH DAILY. What changed:  when to take this reasons to take this   Breztri Aerosphere 160-9-4.8 MCG/ACT Aero Generic drug:  Budeson-Glycopyrrol-Formoterol Inhale 2 puffs into the lungs 2 (two) times daily. Rinse mouth   dexamethasone 6 MG tablet Commonly known as: DECADRON Take 1 tablet (6 mg total) by mouth daily. Start taking on: July 18, 2023   EPINEPHrine 0.3 mg/0.3 mL Soaj injection Commonly known as: EPI-PEN Inject into thigh for severe allergic reaction   famotidine 20 MG tablet Commonly known as: Pepcid One after supper What changed:  how much to take how to take this when to take this additional instructions   montelukast 10 MG tablet Commonly known as: Singulair Take 1 tablet (10 mg total) by mouth at bedtime. What changed: when to take this   Ohtuvayre 3 MG/2.5ML Susp Generic drug: Ensifentrine Inhale 2.5 mLs into the lungs 2 (two) times daily.   pantoprazole 40 MG tablet Commonly known as: Protonix Take 1 tablet (40 mg total) by mouth daily. Take 30-60 min before first meal of the day   primidone 50 MG tablet Commonly known as: MYSOLINE TAKE 4 TABLETS TWICE A DAY What changed:  how much to take how to take this when to take this additional instructions        Discharge Exam: Filed Weights   07/11/23 1144  Weight: 79.4 kg   HEENT:  [place]/AT, No thrush, no icterus CV:  RRR, no rub, no S3, no S4 Lung:  bibasilar rales.  No wheeze Abd:  soft/+BS, NT Ext:  No edema, no lymphangitis, no synovitis, no rash   Condition at discharge: stable  The results of significant diagnostics from this hospitalization (including imaging, microbiology, ancillary and laboratory) are listed below for reference.   Imaging Studies: US THYROID Result Date: July 28, 2023 CLINICAL DATA:  Nodules noted on CT chest EXAM: THYROID ULTRASOUND TECHNIQUE: Ultrasound examination of the thyroid gland and adjacent soft tissues was performed. COMPARISON:  11/25/2020 FINDINGS: Parenchymal Echotexture: Moderately heterogenous Isthmus: 0.7 cm thickness, previously 0.2 Right lobe: 5.5 x 2.1 x 1.5 cm, previously  7.2 x 1.5 x 1.4 Left lobe: 7.2 x 3.5 x 3.3 cm, previously 8.2 x 2.9 x 3.3 _________________________________________________________ Estimated total number of nodules >/= 1 cm: 5 Number of spongiform nodules >/=  2 cm not described below (TR1): 0 Number of mixed cystic and solid nodules >/= 1.5 cm not described below (TR2): 0 _________________________________________________________ Nodule # 1: 1.1 cm complex cyst, medial right, previous 0.8; TR2, this nodule does NOT meet TI-RADS criteria for biopsy or dedicated follow-up. Nodule # 2: Ill-defined 1.4 x 0.8 x 0.7 cm isoechoic region, possibly pseudonodule Nodule # 3: 0.8 cm complex cyst, superior left, previously 0.9; This nodule does NOT meet TI-RADS criteria for biopsy or dedicated follow-up. Nodule # 4: Prior biopsy: No Location: Left; mid Maximum size: 1.4 cm; Other 2 dimensions: 0.9 x 1.2 cm, previously, 1.1 x 0.9 x 0.9 cm Composition: solid/almost completely solid (2) Echogenicity: isoechoic (1) Shape: not taller-than-wide (0) Margins: smooth (0) Echogenic foci: none (0) ACR TI-RADS total points: 3. ACR TI-RADS risk category:  TR 3. Significant change in size (>/= 20% in two dimensions and minimal increase of 2 mm): No Change in features: No Change in ACR TI-RADS risk category: No ACR TI-RADS recommendations: Given size (<1.4 cm) and appearance, this nodule does NOT meet TI-RADS criteria for  biopsy or dedicated follow-up. _________________________________________________________ Nodule 5: 1 cm mildly complex cyst, medial left, previously 0.8 cm; This nodule does NOT meet TI-RADS criteria for biopsy or dedicated follow-up. Nodule # 6: Prior biopsy: No Location: Left; inferior Maximum size: 4.2 cm; Other 2 dimensions: 2.8 x 3.1 cm, previously, 4.1 x 2.3 x 3 cm Composition: solid/almost completely solid (2) Echogenicity: isoechoic (1) Shape: not taller-than-wide (0) Margins: ill-defined (0) Echogenic foci: none (0) ACR TI-RADS total points: 3. ACR TI-RADS risk  category:  TR 3. Significant change in size (>/= 20% in two dimensions and minimal increase of 2 mm): No Change in features: No Change in ACR TI-RADS risk category: No ACR TI-RADS recommendations: **Given size (>/= 2.5 cm) and appearance, fine needle aspiration of this mildly suspicious nodule should be considered based on TI-RADS criteria. _________________________________________________________ No regional cervical adenopathy identified. IMPRESSION: 1. Multinodular goiter. 2. 4.2 cm left inferior TR 3 nodule meets criteria for FNA. Electronically Signed   By: Corlis Leak M.D.   On: 07/13/2023 09:30   CT Chest W Contrast Result Date: 07/11/2023 CLINICAL DATA:  Abnormal x-ray. EXAM: CT CHEST WITH CONTRAST TECHNIQUE: Multidetector CT imaging of the chest was performed during intravenous contrast administration. RADIATION DOSE REDUCTION: This exam was performed according to the departmental dose-optimization program which includes automated exposure control, adjustment of the mA and/or kV according to patient size and/or use of iterative reconstruction technique. CONTRAST:  75mL OMNIPAQUE IOHEXOL 300 MG/ML  SOLN COMPARISON:  Chest x-ray earlier 07/11/2023.  Chest CT 04/20/2023. FINDINGS: Cardiovascular: Thoracic aorta is normal course and caliber with slight atherosclerotic calcified plaque. Heart is nonenlarged. No pericardial effusion. Mediastinum/Nodes: Slightly patulous thoracic esophagus. Left-sided thyroid nodule once again seen measuring 3.8 cm. Please correlate for any prior workup. This may be increasing. Please correlate with prior thyroid ultrasound from 11/25/2020. Prominent right axillary node today measures 9 mm in short axis. Previously same node measured 6 mm. Also slight increasing in small left-sided axillary nodes. There are some enlarged hilar nodes. Example on the right measures 15 by 12 mm. Previously 16 by 12 mm. Small left hilar nodes. There are some prominent mediastinal nodes identified  which are similar to previous. Lungs/Pleura: Scattered centrilobular and paraseptal emphysematous change. No frank consolidation, pneumothorax or effusion. Previously there were areas of ground-glass interstitial changes along the anterior inferior left upper lobe and right upper lobe. These areas are slightly improved from previous. There also areas previously in the middle lobe, lingula and lower lobes which have shown significant improvement as well but there is significant residual particularly along the lower lobes and inferior middle lobe and lingula. Some of the areas of opacity are somewhat nodular, for example right lower lobe medially measuring 8 mm on series 3, image 107. Previously 9 mm. Other areas are also similar. Upper Abdomen: Adrenal glands are preserved in the upper abdomen. Stable benign-appearing right-sided renal cysts. Musculoskeletal: Healing left-sided rib fractures are identified of the seventh and ninth ribs. These were not seen on the prior CT. Please correlate with history. Stable sclerotic lesion along the right second rib, possible bone island. IMPRESSION: Previous areas of interstitial, ground-glass and consolidative lung opacities are intervally improved from November 2024 but there is significant residual particularly along the lower lung zones. There are also some stable areas of noncalcified lung nodularity. Recommend continued surveillance in 3 months. Few lymph nodes that are enlarged in the mediastinum and hilum are similar. There are some small not pathologic nodes identified in the axillary regions but  are slightly larger and again attention on follow-up. Left thyroid nodule identified which may be increasing going back to 2022. Please correlate with prior workup and if needed a repeat ultrasound when clinically appropriate to assess for change. Please correlate with any prior FNA results as an aggressive lesion is possible by imaging. Emphysema (ICD10-J43.9). Electronically  Signed   By: Karen Kays M.D.   On: 07/11/2023 16:40   DG Chest 2 View Result Date: 07/11/2023 CLINICAL DATA:  Chest pain and shortness of breath. EXAM: CHEST - 2 VIEW COMPARISON:  Chest radiograph dated 05/30/2023. FINDINGS: The heart size and mediastinal contours are within normal limits. Decreased bibasilar opacities with mild right-greater-than-left hazy basilar opacities. No new focal consolidation, pleural effusion or pneumothorax. No acute osseous abnormality. IMPRESSION: Mild right-greater-than-left hazy basilar opacities, significantly decreased since the prior exam. No new focal consolidation. Electronically Signed   By: Hart Robinsons M.D.   On: 07/11/2023 13:35    Microbiology: Results for orders placed or performed during the hospital encounter of 07/11/23  Resp panel by RT-PCR (RSV, Flu A&B, Covid) Anterior Nasal Swab     Status: None   Collection Time: 07/11/23  7:07 PM   Specimen: Anterior Nasal Swab  Result Value Ref Range Status   SARS Coronavirus 2 by RT PCR NEGATIVE NEGATIVE Final    Comment: (NOTE) SARS-CoV-2 target nucleic acids are NOT DETECTED.  The SARS-CoV-2 RNA is generally detectable in upper respiratory specimens during the acute phase of infection. The lowest concentration of SARS-CoV-2 viral copies this assay can detect is 138 copies/mL. A negative result does not preclude SARS-Cov-2 infection and should not be used as the sole basis for treatment or other patient management decisions. A negative result may occur with  improper specimen collection/handling, submission of specimen other than nasopharyngeal swab, presence of viral mutation(s) within the areas targeted by this assay, and inadequate number of viral copies(<138 copies/mL). A negative result must be combined with clinical observations, patient history, and epidemiological information. The expected result is Negative.  Fact Sheet for Patients:  BloggerCourse.com  Fact  Sheet for Healthcare Providers:  SeriousBroker.it  This test is no t yet approved or cleared by the [place] FDA and  has been authorized for detection and/or diagnosis of SARS-CoV-2 by FDA under an Emergency Use Authorization (EUA). This EUA will remain  in effect (meaning this test can be used) for the duration of the COVID-19 declaration under Section 564(b)(1) of the Act, 21 U.S.C.section 360bbb-3(b)(1), unless the authorization is terminated  or revoked sooner.       Influenza A by PCR NEGATIVE NEGATIVE Final   Influenza B by PCR NEGATIVE NEGATIVE Final    Comment: (NOTE) The Xpert Xpress SARS-CoV-2/FLU/RSV plus assay is intended as an aid in the diagnosis of influenza from Nasopharyngeal swab specimens and should not be used as a sole basis for treatment. Nasal washings and aspirates are unacceptable for Xpert Xpress SARS-CoV-2/FLU/RSV testing.  Fact Sheet for Patients: BloggerCourse.com  Fact Sheet for Healthcare Providers: SeriousBroker.it  This test is not yet approved or cleared by the [place] FDA and has been authorized for detection and/or diagnosis of SARS-CoV-2 by FDA under an Emergency Use Authorization (EUA). This EUA will remain in effect (meaning this test can be used) for the duration of the COVID-19 declaration under Section 564(b)(1) of the Act, 21 U.S.C. section 360bbb-3(b)(1), unless the authorization is terminated or revoked.     Resp Syncytial Virus by PCR NEGATIVE NEGATIVE Final  Comment: (NOTE) Fact Sheet for Patients: BloggerCourse.com  Fact Sheet for Healthcare Providers: SeriousBroker.it  This test is not yet approved or cleared by the [place] FDA and has been authorized for detection and/or diagnosis of SARS-CoV-2 by FDA under an Emergency Use Authorization (EUA). This EUA will remain in effect  (meaning this test can be used) for the duration of the COVID-19 declaration under Section 564(b)(1) of the Act, 21 U.S.C. section 360bbb-3(b)(1), unless the authorization is terminated or revoked.  Performed at [facility], [address] [place], [place] [address]   Respiratory (~20 pathogens) panel by PCR     Status: Abnormal   Collection Time: 07/13/23  1:55 PM   Specimen: Nasopharyngeal Swab; Respiratory  Result Value Ref Range Status   Adenovirus NOT DETECTED NOT DETECTED Final   Coronavirus 229E NOT DETECTED NOT DETECTED Final    Comment: (NOTE) The Coronavirus on the Respiratory Panel, DOES NOT test for the novel  Coronavirus (2019 nCoV)    Coronavirus HKU1 NOT DETECTED NOT DETECTED Final   Coronavirus NL63 NOT DETECTED NOT DETECTED Final   Coronavirus OC43 NOT DETECTED NOT DETECTED Final   Metapneumovirus NOT DETECTED NOT DETECTED Final   Rhinovirus / Enterovirus NOT DETECTED NOT DETECTED Final   Influenza A H1 2009 DETECTED (A) NOT DETECTED Final   Influenza B NOT DETECTED NOT DETECTED Final   Parainfluenza Virus 1 NOT DETECTED NOT DETECTED Final   Parainfluenza Virus 2 NOT DETECTED NOT DETECTED Final   Parainfluenza Virus 3 NOT DETECTED NOT DETECTED Final   Parainfluenza Virus 4 NOT DETECTED NOT DETECTED Final   Respiratory Syncytial Virus NOT DETECTED NOT DETECTED Final   Bordetella pertussis NOT DETECTED NOT DETECTED Final   Bordetella Parapertussis NOT DETECTED NOT DETECTED Final   Chlamydophila pneumoniae NOT DETECTED NOT DETECTED Final   Mycoplasma pneumoniae NOT DETECTED NOT DETECTED Final    Comment: Performed at [facility], [address], [place], [place] [address]   *Note: Due to a large number of results and/or encounters for the requested time period, some results have not been displayed. A complete set of results can be found in Results Review.    Labs: CBC: Recent Labs  Lab 07/11/23 1236 07/12/23 0333  WBC 6.6 11.5*  NEUTROABS 4.7  --    HGB 13.3 12.6*  HCT 41.0 38.5*  MCV 93.2 93.2  PLT 244 223   Basic Metabolic Panel: Recent Labs  Lab 07/11/23 1236 07/14/23 0437  NA 139 135  K 3.9 3.6  CL 109 108  CO2 22 23  GLUCOSE 114* 120*  BUN 15 18  CREATININE 0.73 0.67  CALCIUM 8.5* 8.5*  MG  --  1.8  PHOS  --  3.4   Liver Function Tests: Recent Labs  Lab 07/11/23 1236  AST 14*  ALT 19  ALKPHOS 62  BILITOT 0.4  PROT 8.2*  ALBUMIN 3.2*   CBG: No results for input(s): "GLUCAP" in the last 168 hours.  Discharge time spent: greater than 30 minutes.  Signed: Catarina Hartshorn, MD [facility] 07/17/2023

## 2023-07-17 NOTE — Progress Notes (Signed)
 Patient was discharged home. All questions and concerns answered. Patient has all personal belongings, including discharge paperwork. Patient left floor in wheelchair to POV.

## 2023-07-19 NOTE — Progress Notes (Signed)
 Drew Sandoval, male    DOB: [birth year]    MRN: [id number]   Brief patient profile:  48  yowb  quit smoking  2011 at onset of asthma  while working [employer] (p 3rd year working there and continues to do so)  referred to pulmonary clinic in [place]  06/23/2023 by [facility] service   for asthma  already maint on dupixent and ohtuvayre.  Dr Maple Hudson Pt  transferred to [facility] office for convenience   Admit date: 06/04/2023 Discharge date: 06/06/2023     Recommendations for Outpatient Follow-up:  Follow up with pulmonology, Dr. Sherene Sires as scheduled in February Continue on Decadron for 5 days as prescribed Finish course of Levaquin with 4 more days as prescribed Refills given on albuterol inhaler to use for shortness of breath or wheezing Lidocaine patch given for left-sided chest pain Continue other home medications as prior Patient remains high risk for readmission    Brief/Interim Summary: Drew Sandoval is a 48 y.o. male with medical history significant for severe persistent asthma, hyper IgE syndrome, SVT status post ablation, hypertension, and essential tremor who has apparently been struggling with waxing and waning symptoms of shortness of breath along with cough over the last 1.5 months.  He states he has had worsening shortness of breath as well as left-sided chest pain that has worsened over the last several days.  He has been admitted for acute hypoxemic respiratory failure secondary to suspected community-acquired pneumonia with associated asthma exacerbation.  He is overall feeling much better today and is eager for discharge.  He no longer has any oxygen requirements.  He will follow-up with pulmonology outpatient.     Discharge Diagnoses:  Principal Problem:   Acute hypoxemic respiratory failure (HCC):   Asthma, severe persistent   Allergic eosinophilia   Hyper-IgE syndrome (HCC)   SVT (supraventricular tachycardia) (HCC)   Hypertension, essential   Essential tremor    Thyroid nodule   Acute asthma exacerbation   Lobar pneumonia (HCC)   Principal discharge diagnosis: Acute hypoxemic respiratory failure secondary to community-acquired pneumonia and acute asthma exacerbation.     History of Present Illness  06/23/2023  Pulmonary/ 1st office eval/ [name] / [facility] on [place] and [place] (one month).  Did not feel dupiexent worked p 4 m so stopped in July 2024 by insurance  Chief Complaint  Patient presents with   Asthma  Dyspnea:  x 100 ft slow pace  Cough: lots of coughing x July 2014 dry sounding worse with exertion and lie down x sev hours  / prednisone helps a lot > min mucoid production  Sleep: bed is flat with 3 pillows   SABA use: on job uses hfa up 4-5 times in  8 h and at home once at home / neb avg once every other day  02: none Rec Plan A = Automatic = Always=    Breztri Take 2 puffs first thing in am and then another 2 puffs about 12 hours later.   Work on inhaler technique:   >>>  Remember how golfers warm up by taking practice swings - do this with an empty inhaler  Plan B = Backup (to supplement plan A, not to replace it) Only use your albuterol inhaler as a rescue medication  Plan C = Crisis (instead of Plan B but only if Plan B stops working) - only use your levoalbuterol nebulizer if you first try Plan B  Prednisone 10 mg  2 each am until better then 1  daily x 5 days and stop  Panntoprazole (protonix) 40 mg   Take  30-60 min before first meal of the day and Pepcid (famotidine)  20 mg after supper until return to office   GERD diet reviewed, bed blocks rec  Please schedule a follow up office visit in 4 weeks, sooner if needed  with all medications /inhalers/ solutions in hand    Admit date:     07/11/2023  Discharge date: 07/17/23      Recommendations at discharge:   Please follow up with primary care provider within 1-2 weeks  Please repeat BMP and CBC in one week         Hospital Course: 48 y.o. male former smoker  with medical history significant for severe asthma and hypertension.  He started smoking at age 55.  Patient presented to the ED with complaints of difficulty breathing with associated productive cough.  Symptoms have been ongoing and progressing over the past 2 to 3 weeks.  Reports symptoms started after he completed his last dose of antibiotics at home.  Reports chronic left-sided chest pain that is nonradiating.  Reports poor oral intake, no vomiting or diarrhea.  Does not smoke cigarettes anymore.   Recent hospitalization 1/25 to 1/27 for acute hypoxic respiratory failure secondary to pneumonia and asthma exacerbation.  Was treated with Levaquin was continued on discharge, but tolerated an initial dose of cefepime in the ED.  He followed up with pulmonologist Dr. Sherene Sires on discharge as recommended.     Assessment and Plan: Acute respiratory failure with hypoxia -Secondary to asthma exacerbation -Stable on 3 L>>2L>>RA -Viral respiratory panel +flu A/H1 -Wean oxygen as tolerated -ambulated on RA on day of d/c without desaturation <95%   Asthma/COPD Exacerbation--Influenza pneumonitis -has 30 pack yrs tobacco -continue albuterol neb -added yupelri -started pulmicort and brovana -increase pulmicort to 0.5 mg bid -continue singulair -continue IV dexamethasone>>d/c home with po dexamethasone -COVID-19/RSV/Flu--neg -continue ensifentrine  -PCT <0.10 -started oseltamivir--finished 4/5 days in hospital -continue hycodan for cough   Essential Tremor -continue mysoline   Hypertension -continue amlodipine   Multinodular goiter -needs outpt follow up with endocrine  -3/4 thyroid US--multinodular goiter -TSH 0.311 -check Free T4--0.95   Hx SVT s/p ablation -currently in sinus            07/21/2023 post hosp  f/u ov/[facility] office/Elley Harp re: severe asthma  maint on Breztri /ohtuvayre  did  bring  some  meds  says  Chief Complaint  Patient presents with   Follow-up    4 wk follow  up for asthma   Dyspnea:  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    Also denies any obvious fluctuation of symptoms with weather or environmental changes or other aggravating or alleviating factors except as outlined above   No unusual exposure hx or h/o childhood pna/ asthma or knowledge of premature birth.  Current Allergies, Complete Past Medical History, Past Surgical History, Family History, and Social History were reviewed in [employer] record.  ROS  The following are not active complaints unless bolded Hoarseness, sore throat, dysphagia,  dental problems, itching, sneezing,  nasal congestion or discharge of excess mucus or purulent secretions, ear ache,   fever, chills, sweats, unintended wt loss or wt gain, classically pleuritic or exertional cp,  orthopnea pnd or arm/hand swelling  or leg swelling, presyncope, palpitations, abdominal pain, anorexia, nausea, vomiting, diarrhea  or change in bowel habits or change in bladder habits, change in stools or change in urine, dysuria, hematuria,  rash, arthralgias, visual complaints, headache, numbness, weakness or ataxia or problems with walking or coordination,  change in mood or  memory.        Current Meds  Medication Sig   acetaminophen (TYLENOL) 500 MG tablet Take 1,000 mg by mouth every 6 (six) hours as needed for moderate pain.   albuterol (VENTOLIN HFA) 108 (90 Base) MCG/ACT inhaler Inhale 2 puffs into the lungs every 6 (six) hours as needed for wheezing or shortness of breath.   amLODipine (NORVASC) 5 MG tablet TAKE 1 TABLET (5 MG TOTAL) BY MOUTH DAILY. (Patient taking  differently: Take 5 mg by mouth as needed (high blood pressure).)   Budeson-Glycopyrrol-Formoterol (BREZTRI AEROSPHERE) 160-9-4.8 MCG/ACT AERO Inhale 2 puffs into the lungs 2 (two) times daily. Rinse mouth   dexamethasone (DECADRON) 6 MG tablet Take 1 tablet (6 mg total) by mouth daily.   Ensifentrine (OHTUVAYRE) 3 MG/2.5ML SUSP Inhale 2.5 mLs into the lungs 2 (two) times daily.   EPINEPHrine 0.3 mg/0.3 mL IJ SOAJ injection Inject into thigh for severe allergic reaction   famotidine (PEPCID) 20 MG tablet One after supper (Patient taking differently: Take 20 mg by mouth at bedtime.)   montelukast (SINGULAIR) 10 MG tablet Take 1 tablet (10 mg total) by mouth at bedtime. (Patient taking differently: Take 10 mg by mouth daily.)   pantoprazole (PROTONIX) 40 MG tablet Take 1 tablet (40 mg total) by mouth daily. Take 30-60 min before first meal of the day   primidone (MYSOLINE) 50 MG tablet TAKE 4 TABLETS TWICE A DAY (Patient taking differently: Take 200 mg by mouth 2 (two) times daily.)           Past Medical History:  Diagnosis Date   Acute conjunctivitis of right eye 10/07/2014   Acute respiratory failure with hypoxia (HCC) 07/23/2013   Asthma    Asthma    Phreesia 06/26/2020   Asthma, severe persistent 07/16/2010   PFT >> spiro 2012 with FEV1 1.14 (28%), ratio 45 Alpha 1 (08/12/10) >>MM ([facility])  IGE 1747, positive allergy profile >> Elevated eos on CBC S/p intubation 10/2010 Job exposure to respiratory irritant - bleach, chicken Allergy vaccine begun 11/09/2010. DC'd by pt 2013- resumed 2013> 1:50 GH; DC'd [facility] 9/24-9/27/ 2013- Status asthma, no vent. [facility]- status ast   COVID-19 virus infection 06/17/2020   January, 2022. No hosp, no MAB   Eczema, allergic 07/22/2011   Essential tremor 06/27/2020   Hypertension    Seasonal allergies    Seasonal and perennial allergic rhinitis 07/17/2014   FENO 03/18/16- 100  High, indicates allergic asthma    Sinusitis, maxillary, 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   Sinus CT 12/10/2013 >>>    Status asthmaticus 10/19/2010   Sustained SVT (HCC) 09/24/2014   SVT (supraventricular tachycardia) (HCC) 09/24/2014      Objective:     07/21/2023       170  07/11/23 175 lb (79.4 kg)  06/23/23 175 lb (79.4 kg)  06/04/23 186 lb 15.2 oz (84.8 kg)      Vital signs reviewed  07/21/2023  - Note at rest 02 sats  90% on RA   General appearance:    amb bm nad   HEENT : Oropharynx  clear      Nasal turbinates nl    NECK :  without  apparent JVD/ palpable Nodes/TM    LUNGS: no acc muscle use,  Nl contour chest  with late exp wheezes bilaterally esp with FVC  without cough on insp or exp maneuvers   CV:  RRR  no s3 or murmur or increase in P2, and no edema   ABD:  soft and nontender   MS:  Gait nl   ext warm without deformities Or obvious joint restrictions  calf tenderness, cyanosis or clubbing    SKIN: warm and dry without lesions    NEURO:  alert, approp, nl sensorium with  no motor or cerebellar deficits apparent.      I personally reviewed images and agree with radiology impression as follows:  Chest CT 07/11/23   Previous areas of interstitial, ground-glass and consolidative lung opacities are intervally improved from November 2024 but there is significant residual particularly along the lower lung zones. There are also some stable areas of noncalcified lung nodularity.            Assessment

## 2023-07-21 ENCOUNTER — Encounter: Payer: Self-pay | Admitting: Internal Medicine

## 2023-07-21 ENCOUNTER — Ambulatory Visit: Payer: BC Managed Care – PPO | Admitting: Internal Medicine

## 2023-07-21 VITALS — BP 167/96 | HR 89 | Ht 75.0 in | Wt 170.8 lb

## 2023-07-21 DIAGNOSIS — J4550 Severe persistent asthma, uncomplicated: Secondary | ICD-10-CM | POA: Diagnosis not present

## 2023-07-21 DIAGNOSIS — Z87891 Personal history of nicotine dependence: Secondary | ICD-10-CM

## 2023-07-21 MED ORDER — CEFDINIR 300 MG PO CAPS: 300.0000 mg | ORAL_CAPSULE | Freq: Two times a day (BID) | ORAL | 0 refills | Status: DC

## 2023-07-21 NOTE — Patient Instructions (Addendum)
 Plan A = Automatic = Always=    Breztri Take 2 puffs first thing in am and then another 2 puffs about 12 hours later and continue Ohtuvayre    Plan B = Backup (to supplement plan A, not to replace it) Only use your albuterol inhaler as a rescue medication to be used if you can't catch your breath by resting or doing a relaxed purse lip breathing pattern.  - The less you use it, the better it will work when you need it. - Ok to use the inhaler up to 2 puffs  every 4 hours if you must but call for appointment if use goes up over your usual need - Don't leave home without it !!  (think of it like the spare tire for your car)   Plan C = Crisis (instead of Plan B but only if Plan B stops working) - only use your levoalbuterol nebulizer if you first try Plan B and it fails to help > ok to use the nebulizer up to every 4 hours but if start needing it regularly call for immediate appointment  Prednisone 10 mg  2 each am until better then 1 daily x 5 days and stop   Pantoprazole (protonix) 40 mg   Take  30-60 min before first meal of the day and Pepcid (famotidine)  20 mg after supper until return to office - this is the best way to tell whether stomach acid is contributing to your problem.    GERD (REFLUX)  is an extremely common cause of respiratory symptoms just like yours , many times with no obvious heartburn at all.    It can be treated with medication, but also with lifestyle changes including elevation of the head of your bed (ideally with 6 -8inch blocks under the headboard of your bed),  Smoking cessation, avoidance of late meals, excessive alcohol, and avoid fatty foods, chocolate, peppermint, colas, red wine, and acidic juices such as orange juice.  NO MINT OR MENTHOL PRODUCTS SO NO COUGH DROPS  USE SUGARLESS CANDY INSTEAD (Jolley ranchers or Stover's or Life Savers) or even ice chips will also do - the key is to swallow to prevent all throat clearing. NO OIL BASED VITAMINS - use powdered  substitutes.  Avoid fish oil when coughing.   Omnicef 300 mg twice daily x 10 days should clear up your sinus drainage    Please schedule a follow up office visit in 6 weeks, sooner if needed  with all medications /inhalers/ solutions in hand so we can verify exactly what you are taking. This includes all medications from all doctors and over the counters

## 2023-07-23 NOTE — Assessment & Plan Note (Addendum)
 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 DC'd by pt 2013- resumed 2013> 1:50 GH; DC'd [facility] 9/24-9/27/ 2013- Status asthma, no vent. [facility]- status asthma Nucala therapy begun 2016- dc'd [facility] - Trial of theoph 11/19/13 - 12/10/2013 d/c and rx max gerd rx  Office spirometry 10/09/14- Severe obstructive disease- FVC 2.63/ 52%, FEV1 0.97/ 24%, FEV1/FVC 0.37, FEF25-75% 0.36/ 8% Eosinophils 14% Allergy profile done 07/17/2014 showed very high total IgE 3283 FENO 03/18/16- 100    Office Spirometry 01/25/17-severe obstructive airways disease. FVC 3.52/68%, FEV1 1.80/43%, ratio or 0.51, FEF 25-75% 0.76/18% 0htuvayre trial 05/17/22 >>> - 06/23/2023  After extensive coaching inhaler device,  effectiveness =    50% from a baseline of 25%  - 07/21/2023  After extensive coaching inhaler device,  effectiveness =    75% with hfa   DDX of  difficult airways management almost all start with A and  include Adherence, Ace Inhibitors, Acid Reflux, Active Sinus Disease, Alpha 1 Antitripsin deficiency, Anxiety masquerading as Airways dz,  ABPA,  Allergy(esp in young), Aspiration (esp in elderly), Adverse effects of meds,  Active smoking or vaping, A bunch of PE's (a small clot burden can't cause this syndrome unless there is already severe underlying pulm or vascular dz with poor reserve) plus two Bs  = Bronchiectasis and Beta blocker use..and one C= CHF   BOLDED dx's of greatest concern:  Added empirical GERD rx and 10 d of omnicef for possible sinusitis today   Adherence though is always the initial "prime suspect" and is a multilayered concern that requires a "trust but verify" approach in every patient - starting with knowing how to use medications, especially inhalers,  correctly, keeping up with refills and understanding the fundamental difference between maintenance and prns vs those medications only taken for a very short course and then stopped and not refilled.  Still not understanding/ accepting importance of following the written actiion plan from previous ov so repeated it today in ABCD format with D = deltasone (prednisone) take 2 until better  = minimal need for saba while maint on breztri and ohtuvayre and dupixent if still being covered by insurance.   I emphasized asthma as potentially fatal especially in setting of inadequate anti-inflammatory rx which we can determine by symptoms and need for saba.   Return in 6 weeks (sooner if needed)  with all meds in hand using a trust but verify approach to confirm accurate Medication  Reconciliation The principal here is that until we are certain that the  patients are doing what we've asked, it makes no sense to ask them to do more.        Each maintenance medication was reviewed in detail including emphasizing most importantly the difference between maintenance and prns and under what circumstances the prns are to be triggered using an action plan format where appropriate.  Total time for H and P, chart review, counseling, reviewing hfa/ neb device(s) and generating customized AVS unique to this office visit / same day charting = 34 min >  for multiple  refractory respiratory  symptoms of uncertain etiology leading to recurrent admits which may be potentially avoidable in the future.

## 2023-07-25 ENCOUNTER — Telehealth: Payer: Self-pay | Admitting: Internal Medicine

## 2023-07-25 MED ORDER — PREDNISONE 10 MG PO TABS: ORAL_TABLET | ORAL | 0 refills | Status: DC

## 2023-07-25 NOTE — Telephone Encounter (Signed)
 I called and spoke with the pt and notified of response from Dr Sherene Sires  Pt verbalized understanding  Rx sent to pharm and appt scheduled for 07/28/23 advised to bring all meds

## 2023-07-25 NOTE — Telephone Encounter (Signed)
**Note De-identified  [place] Obfuscation** Please advise 

## 2023-07-25 NOTE — Telephone Encounter (Signed)
 Patient states he is allergic to the antibiotic( Cefdinir) that he was given on 07/21/23.  He states he is itching and has red areas on his body----patient call back [phone]

## 2023-07-25 NOTE — Telephone Encounter (Signed)
 Stop drug Benadryl 25 mg q 4 h prn  Prednisone 10 mg take  4 each am x 2 days,   2 each am x 2 days,  1 each am x 2 days and stop  To er if worse, to office by Friday am for recheck by NP  - bring all meds

## 2023-07-27 NOTE — Consult Note (Signed)
 [facility] Liaison Note  07/27/2023  Drew Sandoval 08-Sep-1975 [id number]  Location: RN Hospital Liaison screened the patient remotely at [facility]  Insurance: [employer]   Drew Sandoval is a 48 y.o. male who is a Primary Care Patient of Sandrea Hughs, MD ([facility]. The patient was screened for  day readmission hospitalization with noted high risk score for unplanned readmission risk with 3 IP/3 ED in 6 months.  The patient was assessed for potential Care Management service needs for post hospital transition for care coordination. Review of patient's electronic medical record reveals patient  was admitted with Asthma. Pt discharged home with self care. Liaison attempted outreach call however unsuccessful. Due to risk and admissions liaison will make a referral for TOC with [facility].   [facility] Care Management/Population Health does not replace or interfere with any arrangements made by the Inpatient Transition of Care team.   For questions contact:   Elliot Cousin, RN, BSN Hospital Liaison [place]   [facility], Population Health Office Hours MTWF  8:00 am-6:00 pm Direct Dial: [phone] mobile [email or web address]

## 2023-07-28 ENCOUNTER — Ambulatory Visit (INDEPENDENT_AMBULATORY_CARE_PROVIDER_SITE_OTHER): Admitting: Internal Medicine

## 2023-07-28 ENCOUNTER — Encounter: Payer: Self-pay | Admitting: Internal Medicine

## 2023-07-28 ENCOUNTER — Telehealth: Payer: Self-pay

## 2023-07-28 VITALS — BP 128/88 | HR 85 | Temp 98.0°F | Ht 75.0 in | Wt 177.6 lb

## 2023-07-28 DIAGNOSIS — J4550 Severe persistent asthma, uncomplicated: Secondary | ICD-10-CM | POA: Diagnosis not present

## 2023-07-28 LAB — POCT EXHALED NITRIC OXIDE: FeNO level (ppb): 25

## 2023-07-28 MED ORDER — PREDNISONE 10 MG PO TABS: ORAL_TABLET | ORAL | 0 refills | Status: DC

## 2023-07-28 NOTE — Patient Instructions (Addendum)
 Plan A = Automatic = Always=    Breztri Take 2 puffs first thing in am and then another 2 puffs about 12 hours later and continue Ohtuvayre twice daily    Plan B = Backup (to supplement plan A, not to replace it) Only use your albuterol inhaler as a rescue medication to be used if you can't catch your breath by resting or doing a relaxed purse lip breathing pattern.  - The less you use it, the better it will work when you need it. - Ok to use the inhaler up to 2 puffs  every 4 hours if you must but call for appointment if use goes up over your usual need - Don't leave home without it !!  (think of it like the spare tire for your car)   Plan C = Crisis (instead of Plan B but only if Plan B stops working) - only use your levoalbuterol nebulizer if you first try Plan B and it fails to help > ok to use the nebulizer up to every 4 hours but if start needing it regularly call for immediate appointment  Plan D = Deltasone, when ABC not working for you  Prednisone 10 mg  2 each am until better then 1 daily x 5 days and stop   Pantoprazole (protonix) 40 mg   Take  30-60 min before first meal of the day and Pepcid (famotidine)  20 mg after supper until return to office - this is the best way to tell whether stomach acid is contributing to your problem.    GERD (REFLUX)  is an extremely common cause of respiratory symptoms just like yours , many times with no obvious heartburn at all.    It can be treated with medication, but also with lifestyle changes including elevation of the head of your bed (ideally with 6 -8inch blocks under the headboard of your bed),  Smoking cessation, avoidance of late meals, excessive alcohol, and avoid fatty foods, chocolate, peppermint, colas, red wine, and acidic juices such as orange juice.  NO MINT OR MENTHOL PRODUCTS SO NO COUGH DROPS  USE SUGARLESS CANDY INSTEAD (Jolley ranchers or Stover's or Life Savers) or even ice chips will also do - the key is to swallow to prevent  all throat clearing. NO OIL BASED VITAMINS - use powdered substitutes.  Avoid fish oil when coughing.    Keep your [facility] appt    Late add:  ? Needs sinus ct or ent eval next? (Allergic to too many meds to just treat empirically 1st)

## 2023-07-28 NOTE — Progress Notes (Signed)
 Drew Sandoval, male    DOB: 1976/03/01    MRN: [id number]   Brief patient profile:  47  yowb  quit smoking  2011 at onset of asthma  while working [employer] (p 3rd year working there and continues to do so)  referred to pulmonary clinic in [place]  06/23/2023 by [facility]   for asthma  already maint on dupixent and ohtuvayre.  Dr Maple Hudson Pt  transferred to [facility] office for convenience   Admit date: 06/04/2023 Discharge date: 06/06/2023     Recommendations for Outpatient Follow-up:  Follow up with pulmonology, Dr. Sherene Sires as scheduled in February Continue on Decadron for 5 days as prescribed Finish course of Levaquin with 4 more days as prescribed Refills given on albuterol inhaler to use for shortness of breath or wheezing Lidocaine patch given for left-sided chest pain Continue other home medications as prior Patient remains high risk for readmission    Brief/Interim Summary: Drew Sandoval is a 48 y.o. male with medical history significant for severe persistent asthma, hyper IgE syndrome, SVT status post ablation, hypertension, and essential tremor who has apparently been struggling with waxing and waning symptoms of shortness of breath along with cough over the last 1.5 months.  He states he has had worsening shortness of breath as well as left-sided chest pain that has worsened over the last several days.  He has been admitted for acute hypoxemic respiratory failure secondary to suspected community-acquired pneumonia with associated asthma exacerbation.  He is overall feeling much better today and is eager for discharge.  He no longer has any oxygen requirements.  He will follow-up with pulmonology outpatient.     Discharge Diagnoses:  Principal Problem:   Acute hypoxemic respiratory failure (HCC):   Asthma, severe persistent   Allergic eosinophilia   Hyper-IgE syndrome (HCC)   SVT (supraventricular tachycardia) (HCC)   Hypertension, essential   Essential tremor    Thyroid nodule   Acute asthma exacerbation   Lobar pneumonia (HCC)   Principal discharge diagnosis: Acute hypoxemic respiratory failure secondary to community-acquired pneumonia and acute asthma exacerbation.     History of Present Illness  06/23/2023  Pulmonary/ 1st office eval/ Drew Sandoval / [facility] on [place] and [place] (one month).  Did not feel dupiexent worked p 4 m so stopped in July 2024 by insurance  Chief Complaint  Patient presents with   Asthma  Dyspnea:  x 100 ft slow pace  Cough: lots of coughing x July 2014 dry sounding worse with exertion and lie down x sev hours  / prednisone helps a lot > min mucoid production  Sleep: bed is flat with 3 pillows   SABA use: on job uses hfa up 4-5 times in  8 h and at home once at home / neb avg once every other day  02: none Rec Plan A = Automatic = Always=    Breztri Take 2 puffs first thing in am and then another 2 puffs about 12 hours later.   Work on inhaler technique:   >>>  Remember how golfers warm up by taking practice swings - do this with an empty inhaler  Plan B = Backup (to supplement plan A, not to replace it) Only use your albuterol inhaler as a rescue medication  Plan C = Crisis (instead of Plan B but only if Plan B stops working) - only use your levoalbuterol nebulizer if you first try Plan B  Prednisone 10 mg  2 each am until better then 1  daily x 5 days and stop  Panntoprazole (protonix) 40 mg   Take  30-60 min before first meal of the day and Pepcid (famotidine)  20 mg after supper until return to office   GERD diet reviewed, bed blocks rec  Please schedule a follow up office visit in 4 weeks, sooner if needed  with all medications /inhalers/ solutions in hand    Admit date:     07/11/2023  Discharge date: 07/17/23      Recommendations at discharge:   Please follow up with primary care provider within 1-2 weeks  Please repeat BMP and CBC in one week         Hospital Course: 48 y.o. male former smoker  with medical history significant for severe asthma and hypertension.  He started smoking at age 47.  Patient presented to the ED with complaints of difficulty breathing with associated productive cough.  Symptoms have been ongoing and progressing over the past 2 to 3 weeks.  Reports symptoms started after he completed his last dose of antibiotics at home.  Reports chronic left-sided chest pain that is nonradiating.  Reports poor oral intake, no vomiting or diarrhea.  Does not smoke cigarettes anymore.   Recent hospitalization 1/25 to 1/27 for acute hypoxic respiratory failure secondary to pneumonia and asthma exacerbation.  Was treated with Levaquin was continued on discharge, but tolerated an initial dose of cefepime in the ED.  He followed up with pulmonologist Dr. Sherene Sires on discharge as recommended.     Assessment and Plan: Acute respiratory failure with hypoxia -Secondary to asthma exacerbation -Stable on 3 L>>2L>>RA -Viral respiratory panel +flu A/H1 -Wean oxygen as tolerated -ambulated on RA on day of d/c without desaturation <95%   Asthma/COPD Exacerbation--Influenza pneumonitis -has 30 pack yrs tobacco -continue albuterol neb -added yupelri -started pulmicort and brovana -increase pulmicort to 0.5 mg bid -continue singulair -continue IV dexamethasone>>d/c home with po dexamethasone -COVID-19/RSV/Flu--neg -continue ensifentrine  -PCT <0.10 -started oseltamivir--finished 4/5 days in hospital -continue hycodan for cough   Essential Tremor -continue mysoline   Hypertension -continue amlodipine   Multinodular goiter -needs outpt follow up with endocrine  -3/4 thyroid US--multinodular goiter -TSH 0.311 -check Free T4--0.95   Hx SVT s/p ablation -currently in sinus            07/21/2023 post hosp  f/u ov/[facility]/Drew Sandoval re: severe asthma  maint on Breztri /ohtuvayre  did  bring  some  meds    Chief Complaint  Patient presents with   Follow-up    4 wk follow up  for asthma   Dyspnea:  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 Cough:  discolored slightly  Sleeping: bed flat but using 6 pillows doing better in terms of noct cough/ wheeze  SABA use: up to 3 x daily/  neb when home  02: none  Rec Plan A = Automatic = Always=    Breztri Take 2 puffs first thing in am and then another 2 puffs about 12 hours later and continue Ohtuvayre   Plan B = Backup (to supplement plan A, not to replace it) Only use your albuterol inhaler as a rescue medication  Plan C = Crisis (instead of Plan B but only if Plan B stops working) - only use your levoalbuterol nebulizer if you first try Plan B and it fails to help > ok to use the nebulizer up to every 4 hours but if start needing it regularly call for immediate appointment Prednisone 10 mg  2  each am until better then 1 daily x 5 days and stop  Pantoprazole (protonix) 40 mg   Take  30-60 min before first meal of the day and Pepcid (famotidine)  20 mg after supper until return to office - this is the best way to tell whether stomach acid is contributing to your problem.   GERD diet reviewed, bed blocks rec  Omnicef 300 mg twice daily x 10 days should clear up your sinus drainage > welps   Please schedule a follow up office visit in 6 weeks, sooner if needed  with all medications /inhalers/ solutions in hand so we can verify exactly what you are taking. 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  Dyspnea:  still at work cleaning  Cough: cloudy  Sleeping: bed is flat/ 3 pillows under s  resp cc  SABA use: much less  02: none    No obvious day to day or daytime variability or assoc excess/ purulent sputum or mucus plugs or hemoptysis or cp or chest tightness, subjective wheeze or overt sinus or hb symptoms.     Also denies any obvious fluctuation of symptoms with weather or environmental changes or other aggravating or alleviating factors except as outlined above   No unusual exposure hx or h/o childhood pna/ asthma or knowledge of premature birth.  Current Allergies, Complete Past Medical History, Past Surgical History, Family History, and Social History were reviewed in [employer] record.  ROS  The following are not active complaints unless bolded Hoarseness, sore throat, dysphagia, dental problems, itching, sneezing,  nasal congestion or discharge of excess mucus or purulent secretions, ear ache,   fever, chills, sweats, unintended wt loss or wt gain, classically pleuritic or exertional cp,  orthopnea pnd or arm/hand swelling  or leg swelling, presyncope, palpitations, abdominal pain, anorexia, nausea, vomiting, diarrhea  or change in bowel habits or change in bladder habits, change in stools or change in urine, dysuria, hematuria,  rash, arthralgias, visual complaints, headache, numbness, weakness or ataxia or problems with walking or coordination,  change in mood or  memory.        Current Meds  Medication Sig   acetaminophen (TYLENOL) 500 MG tablet Take 1,000 mg by mouth every 6 (six) hours as needed for moderate pain.   albuterol (VENTOLIN HFA) 108 (90 Base) MCG/ACT inhaler Inhale 2 puffs into the lungs every 6 (six) hours as needed for wheezing or shortness of breath.   amLODipine (NORVASC) 5 MG tablet TAKE 1 TABLET (5 MG TOTAL) BY MOUTH DAILY. (Patient taking differently: Take 5 mg by mouth as needed (high blood pressure).)   Budeson-Glycopyrrol-Formoterol (BREZTRI AEROSPHERE) 160-9-4.8 MCG/ACT AERO Inhale 2 puffs into the lungs 2 (two) times daily. Rinse mouth   cefdinir (OMNICEF) 300 MG capsule Take 1 capsule (300 mg total) by mouth 2 (two) times daily.   Ensifentrine (OHTUVAYRE) 3 MG/2.5ML SUSP Inhale 2.5 mLs into the lungs 2 (two) times daily.   EPINEPHrine 0.3 mg/0.3  mL IJ SOAJ injection Inject into thigh for severe allergic reaction   famotidine (PEPCID) 20 MG tablet One after supper (Patient taking differently: Take 20 mg by mouth at bedtime.)   montelukast (SINGULAIR) 10 MG tablet Take 1  tablet (10 mg total) by mouth at bedtime. (Patient taking differently: Take 10 mg by mouth daily.)   pantoprazole (PROTONIX) 40 MG tablet Take 1 tablet (40 mg total) by mouth daily. Take 30-60 min before first meal of the day   primidone (MYSOLINE) 50 MG tablet TAKE 4 TABLETS TWICE A DAY (Patient taking differently: Take 200 mg by mouth 2 (two) times daily.)    Prednisone 10 mg  2 each am              Past Medical History:  Diagnosis Date   Acute conjunctivitis of right eye 10/07/2014   Acute respiratory failure with hypoxia (HCC) 07/23/2013   Asthma    Asthma    Phreesia 06/26/2020   Asthma, severe persistent 07/16/2010   PFT >> spiro 2012 with FEV1 1.14 (28%), ratio 45 Alpha 1 (08/12/10) >>MM ([facility])  IGE 1747, positive allergy profile >> Elevated eos on CBC S/p intubation 10/2010 Job exposure to respiratory irritant - bleach, chicken Allergy vaccine begun 11/09/2010. DC'd by pt 2013- resumed 2013> 1:50 GH; DC'd [facility] 9/24-9/27/ 2013- Status asthma, no vent. [facility]- status ast   COVID-19 virus infection 06/17/2020   January, 2022. 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   SVT (supraventricular tachycardia) (HCC) 09/24/2014      Objective:    Wts  07/28/2023        177   07/21/2023       170  07/11/23 175 lb (79.4 kg)  06/23/23 175 lb (79.4 kg)  06/04/23 186 lb 15.2 oz (84.8 kg)    Vital signs reviewed   07/28/2023  - Note at rest 02 sats  93% on RA   General appearance:    amb bm nad   HEENT : Oropharynx  clear      Nasal turbinates mild edema/ no polyps   NECK :  without  apparent JVD/ palpable Nodes/TM    LUNGS: no acc muscle use,  Nl contour chest with trace end exp wheeze bilaterally   CV:  RRR  no s3 or murmur or increase in P2, and no edema   ABD:  soft and nontender   MS:  Gait nl   ext warm without deformities Or obvious joint restrictions  calf tenderness, cyanosis or clubbing    SKIN: warm and dry without lesions    NEURO:  alert, approp, nl sensorium with  no motor or cerebellar deficits apparent.             Assessment

## 2023-07-28 NOTE — Assessment & Plan Note (Addendum)
 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 DC'd by pt 2013- resumed 2013> 1:50 GH; DC'd [facility] 9/24-9/27/ 2013- Status asthma, no vent. [facility] 2015- status asthma Nucala therapy begun 2016- dc'd [facility] - Trial of theoph 11/19/13 - 12/10/2013 d/c and rx max gerd rx  Office spirometry 10/09/14- Severe obstructive disease- FVC 2.63/ 52%, FEV1 0.97/ 24%, FEV1/FVC 0.37, FEF25-75% 0.36/ 8% Eosinophils 14% Allergy profile done 07/17/2014 showed very high total IgE 3283 FENO 03/18/16- 100    Office Spirometry 01/25/17-severe obstructive airways disease. FVC 3.52/68%, FEV1 1.80/43%, ratio or 0.51, FEF 25-75% 0.76/18% 0htuvayre trial 05/17/22 >>> Dupixent stopped p 4 m in July 2024  - 07/28/2023  After extensive coaching inhaler device,  effectiveness =   90% so continue breztri, try ohtuvaye twice (not prn)  - FENO   07/28/2023    25 on breztr 2bid and prednisone 20 mg daily    DDX of  difficult airways management almost all start with A and  include Adherence, Ace Inhibitors, Acid Reflux, Active Sinus Disease, Alpha 1 Antitripsin deficiency, Anxiety masquerading as Airways dz,  ABPA,  Allergy(esp in young), Aspiration (esp in elderly), Adverse effects of meds,  Active smoking or vaping, A bunch of PE's (a small clot burden can't cause this syndrome unless there is already severe underlying pulm or vascular dz with poor reserve) plus two Bs  = Bronchiectasis and Beta blocker use..and one C= CHF   Adherence is always the initial "prime suspect" and is a multilayered concern that requires a "trust but verify" approach in every patient - starting with knowing how to use medications, especially inhalers, correctly, keeping up with refills and understanding the fundamental  difference between maintenance and prns vs those medications only taken for a very short course and then stopped and not refilled.  - see hfa teaching - separated out main vs prns emphasizing ohtuvayre neb is bid, not prn, but levoalbutorol neb is prn   ? Acid (or non-acid) GERD > always difficult to exclude as up to 75% of pts in some series report no assoc GI/ Heartburn symptoms> rec continue max (24h)  acid suppression and diet restrictions/ reviewed     ? Allergy > continue singulair, high dose ICS  and added continue pred as plan D with f/u in [facility] as planned ? [employer] > Riki Altes might be an option if insurance will pay for it   ? Sinus DX > last ct showed considerable dz > consider repeating.     Each maintenance medication was reviewed in detail including emphasizing most importantly the difference between maintenance and prns and under what circumstances the prns are to be triggered using an action plan format where appropriate.  Total time for H and P, chart review, counseling, reviewing hfa device(s) and generating customized AVS unique to this office visit / same day charting = 40 min

## 2023-08-01 ENCOUNTER — Telehealth: Payer: Self-pay | Admitting: *Deleted

## 2023-08-01 NOTE — Progress Notes (Signed)
 Complex Care Management Note Care Guide Note  08/01/2023 Name: Drew Sandoval MRN: [id number] DOB: 12-Jul-1975   Complex Care Management Outreach Attempts: An unsuccessful telephone outreach was attempted today to offer the patient information about available complex care management services.  Follow Up Plan:  Additional outreach attempts will be made to offer the patient complex care management information and services.   Encounter Outcome:  No Answer  Gwenevere Ghazi  [facility]  [facility], [facility] Guide  Direct Dial: [phone]  Fax [phone]

## 2023-08-04 NOTE — Progress Notes (Signed)
 Complex Care Management Note Care Guide Note  08/04/2023 Name: Blandon Offerdahl MRN: [id number] DOB: 1975/10/18   Complex Care Management Outreach Attempts: A second unsuccessful outreach was attempted today to offer the patient with information about available complex care management services.  Follow Up Plan:  Additional outreach attempts will be made to offer the patient complex care management information and services.   Encounter Outcome:  No Answer  Gwenevere Ghazi  [facility]  [facility], [facility] Guide  Direct Dial: [phone]  Fax [phone]

## 2023-08-08 NOTE — Progress Notes (Signed)
 Complex Care Management Note Care Guide Note  08/08/2023 Name: Drew Sandoval MRN: [id number] DOB: April 10, 1976   Complex Care Management Outreach Attempts: A third unsuccessful outreach was attempted today to offer the patient with information about available complex care management services.  Follow Up Plan:  No further outreach attempts will be made at this time. We have been unable to contact the patient to offer or enroll patient in complex care management services.  Encounter Outcome:  No Answer  Gwenevere Ghazi  [facility]  [facility], [facility] Guide  Direct Dial: [phone]  Fax [phone]

## 2023-08-23 ENCOUNTER — Other Ambulatory Visit: Payer: Self-pay | Admitting: Neurology

## 2023-08-23 ENCOUNTER — Ambulatory Visit
Admission: RE | Admit: 2023-08-23 | Discharge: 2023-08-23 | Disposition: A | Discharge status: Home / Self Care | Payer: Self-pay | Source: Ambulatory Visit | Attending: Nurse Practitioner

## 2023-08-23 ENCOUNTER — Other Ambulatory Visit: Payer: Self-pay

## 2023-08-23 ENCOUNTER — Ambulatory Visit (INDEPENDENT_AMBULATORY_CARE_PROVIDER_SITE_OTHER)

## 2023-08-23 VITALS — BP 141/86 | HR 109 | Temp 98.2°F | Resp 20

## 2023-08-23 DIAGNOSIS — R051 Acute cough: Secondary | ICD-10-CM | POA: Diagnosis not present

## 2023-08-23 DIAGNOSIS — J189 Pneumonia, unspecified organism: Secondary | ICD-10-CM

## 2023-08-23 DIAGNOSIS — J4551 Severe persistent asthma with (acute) exacerbation: Secondary | ICD-10-CM

## 2023-08-23 DIAGNOSIS — R918 Other nonspecific abnormal finding of lung field: Secondary | ICD-10-CM | POA: Diagnosis not present

## 2023-08-23 DIAGNOSIS — G250 Essential tremor: Secondary | ICD-10-CM

## 2023-08-23 LAB — POC COVID19/FLU A&B COMBO
Covid Antigen, POC: NEGATIVE
Influenza A Antigen, POC: NEGATIVE
Influenza B Antigen, POC: NEGATIVE

## 2023-08-23 MED ORDER — PROMETHAZINE-DM 6.25-15 MG/5ML PO SYRP: 5.0000 mL | ORAL_SOLUTION | Freq: Four times a day (QID) | ORAL | 0 refills | Status: DC | PRN

## 2023-08-23 MED ORDER — IPRATROPIUM-ALBUTEROL 0.5-2.5 (3) MG/3ML IN SOLN
3.0000 mL | Freq: Once | RESPIRATORY_TRACT | Status: AC
  Administered 2023-08-23: 3 mL via RESPIRATORY_TRACT

## 2023-08-23 MED ORDER — LEVOFLOXACIN 750 MG PO TABS: 750.0000 mg | ORAL_TABLET | Freq: Every day | ORAL | 0 refills | Status: AC

## 2023-08-23 MED ORDER — AMLODIPINE BESYLATE 5 MG PO TABS: 5.0000 mg | ORAL_TABLET | Freq: Every day | ORAL | 0 refills | Status: DC

## 2023-08-23 MED ORDER — PREDNISONE 20 MG PO TABS: 40.0000 mg | ORAL_TABLET | Freq: Every day | ORAL | 0 refills | Status: AC

## 2023-08-23 NOTE — Telephone Encounter (Signed)
 Amlodipine refilled, patient must keep 11/2023 apt with Dr.Taylor

## 2023-08-23 NOTE — Discharge Instructions (Signed)
 We gave you a DuoNeb breathing treatment which helped open up your airway today.  I will contact you later today with the results of the chest x-ray show suspicion for new or worsening pneumonia.  In the meantime, start using your albuterol scheduled every 4-6 hours for the next 2 days.  Also start the oral prednisone.  You can use the cough syrup every 6 hours as needed for the coughing.  Seek care if symptoms worsen despite treatment.  Recommend close follow-up with an allergist-contact information has been provided.

## 2023-08-23 NOTE — ED Triage Notes (Signed)
 Pt reports nasal congestion, facial pressure, cough since Saturday. Reports last used inhaler and neb last night.

## 2023-08-23 NOTE — ED Provider Notes (Signed)
 [facility]    CSN: [id number] Arrival date & time: 08/23/23  4098      History   Chief Complaint Chief Complaint  Patient presents with   Nasal Congestion    Allergies, shortness of breath coughing - Entered by patient    HPI Drew Sandoval is a 48 y.o. male.   Patient presents today with a few days of congested cough, shortness of breath and wheezing, sneezing, and sinus congestion, stuffy nose, and fatigue.  He denies known fevers, body aches or chills, chest tightness, runny nose, sore throat, headache, ear pain, abdominal pain, nausea/vomiting, diarrhea, and change in appetite.  He has been taking Sudafed, Benadryl which temporarily helps his symptoms.  Has also been using nebulizer and increasing use of albuterol rescue inhaler with minimal temporary improvement.  Reports last time he used nebulizer was last night.  Patient reports he was recently treated for pneumonia in the hospital last month.  Medical record also shows history of severe persistent asthma, seasonal allergic rhinitis, and hyper IgE mediated syndrome.  Does not currently have an allergist.  Reports his pulmonologist used to manage his allergies but does not any longer.    Past Medical History:  Diagnosis Date   Acute conjunctivitis of right eye 10/07/2014   Acute respiratory failure with hypoxia (HCC) 07/23/2013   Asthma    Asthma    Phreesia 06/26/2020   Asthma, severe persistent 07/16/2010   PFT >> spiro 2012 with FEV1 1.14 (28%), ratio 45 Alpha 1 (08/12/10) >>MM ([facility])  IGE 1747, positive allergy profile >> Elevated eos on CBC S/p intubation 10/2010 Job exposure to respiratory irritant - bleach, chicken Allergy vaccine begun 11/09/2010. DC'd by pt 2013- resumed 2013> 1:50 GH; DC'd [facility] 9/24-9/27/ 2013- Status asthma, no vent. [facility] 3/ 2015- status ast   COVID-19 virus infection 06/17/2020   January, 2022. No hosp, no MAB   Eczema, allergic 07/22/2011    Essential tremor 06/27/2020   Hypertension    Seasonal allergies    Seasonal and perennial allergic rhinitis 07/17/2014   FENO 03/18/16- 100  High, indicates allergic asthma   Sinusitis, maxillary, 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   Sinus CT 12/10/2013 >>>    Status asthmaticus 10/19/2010   Sustained SVT (HCC) 09/24/2014   SVT (supraventricular tachycardia) (HCC) 09/24/2014    Patient Active Problem List   Diagnosis Date Noted   COPD exacerbation (HCC) 07/14/2023   Influenza A 07/14/2023   Asthma exacerbation 07/11/2023   Acute hypoxemic respiratory failure (HCC) 06/04/2023   Acute respiratory failure with hypoxia (HCC) 04/20/2023   Lobar pneumonia (HCC) 04/20/2023   Acute asthma exacerbation 02/04/2023   Acute bronchitis 12/24/2022   Aspiration pneumonia (HCC) 08/26/2021   Hematuria 06/11/2021   Periumbilical abdominal pain 06/11/2021   Left thyroid nodule 11/18/2020   Essential tremor 06/27/2020   Hypertension, essential 06/17/2020   Gynecomastia, male 04/24/2018   Sustained SVT (HCC) 09/24/2014   SVT (supraventricular tachycardia) (HCC) 09/24/2014   Malnutrition of moderate degree (HCC) 09/24/2014   Seasonal and perennial allergic rhinitis 07/17/2014   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 Authorizing Provider  levofloxacin (LEVAQUIN) 750 MG tablet Take 1 tablet (750 mg total) by mouth daily for 5 days. 08/23/23 08/28/23 Yes Valentino Nose, NP  predniSONE (DELTASONE) 20 MG tablet Take 2 tablets (40 mg total) by  mouth daily with breakfast for 5 days. 08/23/23 08/28/23 Yes Valentino Nose, NP  promethazine-dextromethorphan (PROMETHAZINE-DM) 6.25-15 MG/5ML syrup Take 5 mLs by mouth 4 (four) times daily as needed for cough. Do not take with alcohol or while driving or operating heavy machinery.  May cause drowsiness. 08/23/23  Yes Valentino Nose, NP  acetaminophen (TYLENOL) 500 MG tablet Take 1,000 mg by mouth every 6 (six) hours as needed for moderate pain.    [provider]  albuterol (VENTOLIN HFA) 108 (90 Base) MCG/ACT inhaler Inhale 2 puffs into the lungs every 6 (six) hours as needed for wheezing or shortness of breath. 06/06/23   Sherryll Burger, Pratik D, DO  amLODipine (NORVASC) 5 MG tablet Take 1 tablet (5 mg total) by mouth daily. 08/23/23   Marinus Maw, MD  Budeson-Glycopyrrol-Formoterol (BREZTRI AEROSPHERE) 160-9-4.8 MCG/ACT AERO Inhale 2 puffs into the lungs 2 (two) times daily. Rinse mouth 12/24/22   Glenford Bayley, NP  Ensifentrine [facility]) 3 MG/2.5ML SUSP Inhale 2.5 mLs into the lungs 2 (two) times daily. 03/28/23   Jetty Duhamel D, MD  EPINEPHrine 0.3 mg/0.3 mL IJ SOAJ injection Inject into thigh for severe allergic reaction 09/28/22   Jetty Duhamel D, MD  famotidine (PEPCID) 20 MG tablet One after supper Patient taking differently: Take 20 mg by mouth at bedtime. 06/23/23   Nyoka Cowden, MD  montelukast (SINGULAIR) 10 MG tablet Take 1 tablet (10 mg total) by mouth at bedtime. Patient taking differently: Take 10 mg by mouth daily. 12/24/22   Glenford Bayley, NP  pantoprazole (PROTONIX) 40 MG tablet Take 1 tablet (40 mg total) by mouth daily. Take 30-60 min before first meal of the day 06/23/23   Nyoka Cowden, MD  primidone (MYSOLINE) 50 MG tablet TAKE 4 TABLETS TWICE A DAY 08/23/23   Tat, Octaviano Batty, DO    Family History Family History  Problem Relation Age of Onset   Lung cancer Mother    Emphysema Father    Arthritis Sister    Diabetes Maternal Grandmother    Thyroid  disease Neg Hx     Social History Social History   Tobacco Use   Smoking status: Former    Current packs/day: 0.00    Average packs/day: 0.5 packs/day for 11.5 years (5.8 ttl pk-yrs)    Types: Cigarettes    Start date: 10/09/1998    Quit date: 04/09/2010    Years since quitting: 13.3   Smokeless tobacco: Former   Tobacco comments:    started at age 56.  1/2 ppd.    Vaping Use   Vaping status: Never Used  Substance Use Topics   Alcohol use: Yes    Comment: rarely   Drug use: No     Allergies   Azithromycin, Banana, Solu-medrol [methylprednisolone], Omnicef [cefdinir], Alvesco [ciclesonide], Anoro ellipta [umeclidinium-vilanterol], and Penicillins   Review of Systems Review of Systems Per HPI  Physical Exam Triage Vital Signs ED Triage Vitals  Encounter Vitals Group     BP 08/23/23 0947 (!) 141/86     Systolic BP Percentile --      Diastolic BP Percentile --  Pulse Rate 08/23/23 0947 (!) 109     Resp 08/23/23 0947 20     Temp 08/23/23 0947 98.2 F (36.8 C)     Temp Source 08/23/23 0947 Oral     SpO2 08/23/23 0947 91 %     Weight --      Height --      Head Circumference --      Peak Flow --      Pain Score 08/23/23 0946 0     Pain Loc --      Pain Education --      Exclude from Growth Chart --    No data found.  Updated Vital Signs BP (!) 141/86 (BP Location: Right Arm)   Pulse (!) 109   Temp 98.2 F (36.8 C) (Oral)   Resp 20   SpO2 91%   Visual Acuity Right Eye Distance:   Left Eye Distance:   Bilateral Distance:    Right Eye Near:   Left Eye Near:    Bilateral Near:     Physical Exam Vitals and nursing note reviewed.  Constitutional:      General: He is not in acute distress.    Appearance: Normal appearance. He is not ill-appearing or toxic-appearing.  HENT:     Head: Normocephalic and atraumatic.     Right Ear: Tympanic membrane, ear canal and external ear normal.     Left Ear: Tympanic membrane, ear canal and external ear normal.      Nose: No congestion or rhinorrhea.     Mouth/Throat:     Mouth: Mucous membranes are moist.     Pharynx: Oropharynx is clear. No oropharyngeal exudate or posterior oropharyngeal erythema.  Eyes:     General: No scleral icterus.    Extraocular Movements: Extraocular movements intact.  Cardiovascular:     Rate and Rhythm: Regular rhythm. Tachycardia present.  Pulmonary:     Effort: Pulmonary effort is normal. No respiratory distress.     Breath sounds: Decreased air movement present. Wheezing and rhonchi present. No rales.  Musculoskeletal:     Cervical back: Normal range of motion and neck supple.  Lymphadenopathy:     Cervical: No cervical adenopathy.  Skin:    General: Skin is warm and dry.     Coloration: Skin is not jaundiced or pale.     Findings: No erythema or rash.  Neurological:     Mental Status: He is alert and oriented to person, place, and time.  Psychiatric:        Behavior: Behavior is cooperative.      UC Treatments / Results  Labs (all labs ordered are listed, but only abnormal results are displayed) Labs Reviewed  POC COVID19/FLU A&B COMBO    EKG   Radiology DG Chest 2 View Result Date: 08/23/2023 CLINICAL DATA:  Acute cough EXAM: CHEST - 2 VIEW COMPARISON:  X-ray and CT 07/11/2023 and older FINDINGS: Hyperinflation. No pneumothorax or edema. No effusion. Slight increased opacity at the lung bases, right-greater-than-left. Infiltrate is possible. Normal cardiopericardial silhouette. Old left-sided rib fractures. IMPRESSION: Increasing lung base opacities, right-greater-than-left. Recommend follow-up. Electronically Signed   By: Karen Kays M.D.   On: 08/23/2023 11:05    Procedures Procedures (including critical care time)  Medications Ordered in UC Medications  ipratropium-albuterol (DUONEB) 0.5-2.5 (3) MG/3ML nebulizer solution 3 mL (3 mLs Nebulization Given 08/23/23 1029)    Initial Impression / Assessment and Plan / UC Course  I have  reviewed the triage vital signs  and the nursing notes.  Pertinent labs & imaging results that were available during my care of the patient were reviewed by me and considered in my medical decision making (see chart for details).   In triage, patient is mildly hypertensive, tachycardic, and SpO2 is 90 to 91% on room air.  After DuoNeb, tachycardia improved and SpO2 93 to 94% on room air.  1. Severe persistent asthma with acute exacerbation 2. Pneumonia of both lungs due to infectious organism, unspecified part of lung Vitals are improved after DuoNeb Chest x-ray pending at time of discharge; will contact patient if results warrant any change in treatment plan In meantime, start scheduled breathing treatments, oral prednisone, cough suppressant, guaifenesin, and recommended close follow-up with pulmonologist Contact information was also given for allergist  Update: Chest x-ray is concerning for worsening pneumonia from last month.  It looks like he was discharged on cefdinir.  Will start Levaquin.  I called patient and updated him on plan of care, he is in agreement to plan.  All questions answered.  The patient was given the opportunity to ask questions.  All questions answered to their satisfaction.  The patient is in agreement to this plan.   Final Clinical Impressions(s) / UC Diagnoses   Final diagnoses:  Severe persistent asthma with acute exacerbation  Pneumonia of both lungs due to infectious organism, unspecified part of lung     Discharge Instructions      We gave you a DuoNeb breathing treatment which helped open up your airway today.  I will contact you later today with the results of the chest x-ray show suspicion for new or worsening pneumonia.  In the meantime, start using your albuterol scheduled every 4-6 hours for the next 2 days.  Also start the oral prednisone.  You can use the cough syrup every 6 hours as needed for the coughing.  Seek care if symptoms worsen despite  treatment.  Recommend close follow-up with an allergist-contact information has been provided.     ED Prescriptions     Medication Sig Dispense Auth. Provider   predniSONE (DELTASONE) 20 MG tablet Take 2 tablets (40 mg total) by mouth daily with breakfast for 5 days. 10 tablet Thena Fireman A, NP   promethazine-dextromethorphan (PROMETHAZINE-DM) 6.25-15 MG/5ML syrup Take 5 mLs by mouth 4 (four) times daily as needed for cough. Do not take with alcohol or while driving or operating heavy machinery.  May cause drowsiness. 118 mL Thena Fireman A, NP   levofloxacin (LEVAQUIN) 750 MG tablet Take 1 tablet (750 mg total) by mouth daily for 5 days. 5 tablet Wilhemena Harbour, NP      PDMP not reviewed this encounter.   Wilhemena Harbour, NP 08/23/23 1247

## 2023-08-31 NOTE — Progress Notes (Unsigned)
 Drew Sandoval, male    DOB: [birth year]    MRN: [id number]   Brief patient profile:  47  yowb  quit smoking  2011 at onset of asthma  while working [employer] (p 3rd year working there and continues to do so)  referred to pulmonary clinic in [place]  06/23/2023 by [facility]   for asthma  already maint on dupixent  and ohtuvayre .  Dr Drew Sandoval Pt  transferred to [facility] office for convenience   Admit date: 06/04/2023 Discharge date: 06/06/2023     Recommendations for Outpatient Follow-up:  Follow up with pulmonology, Dr. Waymond Sandoval as scheduled in February Continue on Decadron  for 5 days as prescribed Finish course of Levaquin  with 4 more days as prescribed Refills given on albuterol  inhaler to use for shortness of breath or wheezing Lidocaine  patch given for left-sided chest pain Continue other home medications as prior Patient remains high risk for readmission    Brief/Interim Summary: Drew Sandoval is a 48 y.o. male with medical history significant for severe persistent asthma, hyper IgE syndrome, SVT status post ablation, hypertension, and essential tremor who has apparently been struggling with waxing and waning symptoms of shortness of breath along with cough over the last 1.5 months.  He states he has had worsening shortness of breath as well as left-sided chest pain that has worsened over the last several days.  He has been admitted for acute hypoxemic respiratory failure secondary to suspected community-acquired pneumonia with associated asthma exacerbation.  He is overall feeling much better today and is eager for discharge.  He no longer has any oxygen  requirements.  He will follow-up with pulmonology outpatient.     Discharge Diagnoses:  Principal Problem:   Acute hypoxemic respiratory failure (HCC):   Asthma, severe persistent   Allergic eosinophilia   Hyper-IgE syndrome (HCC)   SVT (supraventricular tachycardia) (HCC)   Hypertension, essential   Essential tremor    Thyroid  nodule   Acute asthma exacerbation   Lobar pneumonia (HCC)   Principal discharge diagnosis: Acute hypoxemic respiratory failure secondary to community-acquired pneumonia and acute asthma exacerbation.     History of Present Illness  06/23/2023  Pulmonary/ 1st office eval/ Drew Sandoval / [name] on Breztri  and Ohtuvayre  (one month).  Did not feel dupiexent worked p 4 m so stopped in July 2024 by insurance  Chief Complaint  Patient presents with   Asthma  Dyspnea:  x 100 ft slow pace  Cough: lots of coughing x July 2014 dry sounding worse with exertion and lie down x sev hours  / prednisone  helps a lot > min mucoid production  Sleep: bed is flat with 3 pillows   SABA use: on job uses hfa up 4-5 times in  8 h and at home once at home / neb avg once every other day  02: none Rec Plan A = Automatic = Always=    Breztri  Take 2 puffs first thing in am and then another 2 puffs about 12 hours later.   Work on inhaler technique:   >>>  Remember how golfers warm up by taking practice swings - do this with an empty inhaler  Plan B = Backup (to supplement plan A, not to replace it) Only use your albuterol  inhaler as a rescue medication  Plan C = Crisis (instead of Plan B but only if Plan B stops working) - only use your levoalbuterol nebulizer if you first try Plan B  Prednisone  10 mg  2 each am until better then 1  daily x 5 days and stop  Panntoprazole (protonix ) 40 mg   Take  30-60 min before first meal of the day and Pepcid  (famotidine )  20 mg after supper until return to office   GERD diet reviewed, bed blocks rec  Please schedule a follow up office visit in 4 weeks, sooner if needed  with all medications /inhalers/ solutions in hand    Admit date:     07/11/2023  Discharge date: 07/17/23      Recommendations at discharge:   Please follow up with primary care provider within 1-2 weeks  Please repeat BMP and CBC in one week         Hospital Course: 48 y.o. male former smoker  with medical history significant for severe asthma and hypertension.  He started smoking at age 10.  Patient presented to the ED with complaints of difficulty breathing with associated productive cough.  Symptoms have been ongoing and progressing over the past 2 to 3 weeks.  Reports symptoms started after he completed his last dose of antibiotics at home.  Reports chronic left-sided chest pain that is nonradiating.  Reports poor oral intake, no vomiting or diarrhea.  Does not smoke cigarettes anymore.   Recent hospitalization 1/25 to 1/27 for acute hypoxic respiratory failure secondary to pneumonia and asthma exacerbation.  Was treated with Levaquin  was continued on discharge, but tolerated an initial dose of cefepime  in the ED.  He followed up with pulmonologist Dr. Waymond Sandoval on discharge as recommended.     Assessment and Plan: Acute respiratory failure with hypoxia -Secondary to asthma exacerbation -Stable on 3 L>>2L>>RA -Viral respiratory panel +flu A/H1 -Wean oxygen  as tolerated -ambulated on RA on day of d/c without desaturation <95%   Asthma/COPD Exacerbation--Influenza pneumonitis -has 30 pack yrs tobacco -continue albuterol  neb -added yupelri  -started pulmicort  and brovana  -increase pulmicort  to 0.5 mg bid -continue singulair  -continue IV dexamethasone >>d/c home with po dexamethasone  -COVID-19/RSV/Flu--neg -continue ensifentrine   -PCT <0.10 -started oseltamivir --finished 4/5 days in hospital -continue hycodan for cough   Essential Tremor -continue mysoline    Hypertension -continue amlodipine    Multinodular goiter -needs outpt follow up with endocrine  -3/4 thyroid  US --multinodular goiter -TSH 0.311 -check Free T4--0.95   Hx SVT s/p ablation -currently in sinus            07/21/2023 post hosp  f/u ov/[facility]/Drew Sandoval re: severe asthma  maint on Breztri  /ohtuvayre   did  bring  some  meds    Chief Complaint  Patient presents with   Follow-up    4 wk follow up  for asthma   Dyspnea:  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 Cough:  discolored slightly  Sleeping: bed flat but using 6 pillows doing better in terms of noct cough/ wheeze  SABA use: up to 3 x daily/  neb when home  02: none  Rec Plan A = Automatic = Always=    Breztri  Take 2 puffs first thing in am and then another 2 puffs about 12 hours later and continue Ohtuvayre    Plan B = Backup (to supplement plan A, not to replace it) Only use your albuterol  inhaler as a rescue medication  Plan C = Crisis (instead of Plan B but only if Plan B stops working) - only use your levoalbuterol nebulizer if you first try Plan B and it fails to help > ok to use the nebulizer up to every 4 hours but if start needing it regularly call for immediate appointment Prednisone  10 mg  2  each am until better then 1 daily x 5 days and stop  Pantoprazole  (protonix ) 40 mg   Take  30-60 min before first meal of the day and Pepcid  (famotidine )  20 mg after supper until return to office - this is the best way to tell whether stomach acid is contributing to your problem.   GERD diet reviewed, bed blocks rec  Omnicef  300 mg twice daily x 10 days should clear up your sinus drainage > welps   Please schedule a follow up office visit in 6 weeks, sooner if needed  with all medications /inhalers/ solutions in hand so we can verify exactly what you are taking. 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  Dyspnea:  still at work cleaning  Cough: cloudy  Sleeping: bed is flat/ 3 pillows under s  resp cc  SABA use: much less  02: none  Rec Plan A = Automatic = Always=    Breztri  Take 2 puffs first thing in am and then another 2 puffs about 12 hours later and continue Ohtuvayre  twice daily  Plan B = Backup (to supplement  plan A, not to replace it) Only use your albuterol  inhaler as a rescue medication  Plan C = Crisis (instead of Plan B but only if Plan B stops working) - only use your levoalbuterol nebulizer if you first try Plan B  Plan D = Deltasone , when ABC not working for you  Prednisone  10 mg  2 each am until better then 1 daily x 5 days and stop  Pantoprazole  (protonix ) 40 mg   Take  30-60 min before first meal of the day and Pepcid  (famotidine )  20 mg after supper until return to office  GERD diet reviewed, bed blocks rec       08/23/22 to UC with "sev days of cough/ sob/ wheeze/ sneeze" and did not activate ABCD plan and told ER doc pulmonary stopped treating his allergies rx 40 mg daily x 5 days    09/01/2023  f/u ov/[place] office/Drew Sandoval re: ACOS  maint on breztri / proair /   Chief Complaint  Patient presents with   Asthma  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  Cough: none  Sleeping: bed flat/ props up on 3 pillows  resp cc  SABA use: none since am 08/31/23  02: none     No obvious day to day or daytime variability or assoc excess/ purulent sputum or mucus plugs or hemoptysis or cp or chest tightness, subjective wheeze or overt sinus or hb symptoms.    Also denies any obvious fluctuation of symptoms with weather or environmental changes or other aggravating or alleviating factors except as outlined above   No unusual exposure hx or h/o childhood pna/ asthma or knowledge of premature birth.  Current Allergies, Complete  Past Medical History, Past Surgical History, Family History, and Social History were reviewed in  [facility] record.  ROS  The following are not active complaints unless bolded Hoarseness, sore throat, dysphagia, dental problems, itching, sneezing,  nasal congestion or discharge of excess mucus or purulent secretions, ear ache,   fever, chills, sweats, unintended wt loss or wt gain, classically pleuritic or exertional cp,  orthopnea pnd or arm/hand swelling  or leg swelling, presyncope, palpitations, abdominal pain, anorexia, nausea, vomiting, diarrhea  or change in bowel habits or change in bladder habits, change in stools or change in urine, dysuria, hematuria,  rash, arthralgias, visual complaints, headache, numbness, weakness or ataxia or problems with walking or coordination,  change in mood or  memory.        Current Meds  Medication Sig   acetaminophen  (TYLENOL ) 500 MG tablet Take 1,000 mg by mouth every 6 (six) hours as needed for moderate pain.   albuterol  (VENTOLIN  HFA) 108 (90 Base) MCG/ACT inhaler Inhale 2 puffs into the lungs every 6 (six) hours as needed for wheezing or shortness of breath.   amLODipine  (NORVASC ) 5 MG tablet Take 1 tablet (5 mg total) by mouth daily.   Budeson-Glycopyrrol-Formoterol  (BREZTRI  AEROSPHERE) 160-9-4.8 MCG/ACT AERO Inhale 2 puffs into the lungs 2 (two) times daily. Rinse mouth   Ensifentrine  (OHTUVAYRE ) 3 MG/2.5ML SUSP Inhale 2.5 mLs into the lungs 2 (two) times daily.   EPINEPHrine  0.3 mg/0.3 mL IJ SOAJ injection Inject into thigh for severe allergic reaction   famotidine  (PEPCID ) 20 MG tablet One after supper (Patient taking differently: Take 20 mg by mouth at bedtime.)   montelukast  (SINGULAIR ) 10 MG tablet Take 1 tablet (10 mg total) by mouth at bedtime. (Patient taking differently: Take 10 mg by mouth daily.)   pantoprazole  (PROTONIX ) 40 MG tablet Take 1 tablet (40 mg total) by mouth daily. Take 30-60 min before first meal of the day   primidone  (MYSOLINE ) 50 MG tablet TAKE 4 TABLETS TWICE A DAY    promethazine -dextromethorphan  (PROMETHAZINE -DM) 6.25-15 MG/5ML syrup Take 5 mLs by mouth 4 (four) times daily as needed for cough. Do not take with alcohol or while driving or operating heavy machinery.  May cause drowsiness.           Past Medical History:  Diagnosis Date   Acute conjunctivitis of right eye 10/07/2014   Acute respiratory failure with hypoxia (HCC) 07/23/2013   Asthma    Asthma    Phreesia 06/26/2020   Asthma, severe persistent 07/16/2010   PFT >> spiro 2012 with FEV1 1.14 (28%), ratio 45 Alpha 1 (08/12/10) >>MM ([facility] [facility] )  IGE 1747, positive allergy  profile >> Elevated eos on CBC S/p intubation 10/2010 Job exposure to respiratory irritant - bleach, chicken Allergy  vaccine begun 11/09/2010. DC'd by pt 2013- resumed 2013> 1:50 GH; DC'd [facility] 9/24-9/27/ 2013- Status asthma, no vent. [facility] 2015- status ast   COVID-19 virus infection 06/17/2020   January, 2022. 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   SVT (supraventricular tachycardia) (HCC) 09/24/2014      Objective:    Wts  09/01/2023  176   07/28/2023        177   07/21/2023       170  07/11/23 175 lb (79.4 kg)  06/23/23 175 lb (79.4 kg)  06/04/23 186 lb 15.2 oz (84.8 kg)    Vital signs reviewed  09/01/2023  - Note at rest 02 sats  94% on RA   General appearance:    amb bm/ easily frustrated re communication with meds/ nebs / action plans    HEENT : Oropharynx  clear   Nasal turbinates nl    NECK :  without  apparent JVD/ palpable Nodes/TM    LUNGS: no acc muscle use,  Mild barrel  contour chest wall with bilateral  Distant mid- exp wheeze and  without cough  on insp or exp maneuvers  and mild  Hyperresonant  to  percussion bilaterally     CV:  RRR  no s3 or murmur or increase in P2, and no edema   ABD:  soft and nontender with pos end  insp Hoover's  in the supine position.  No bruits or organomegaly appreciated   MS:  Nl gait/ ext warm without deformities Or obvious joint restrictions  calf tenderness, cyanosis or clubbing     SKIN: warm and dry without lesions    NEURO:  alert, approp, nl sensorium with  no motor or cerebellar deficits apparent.        I personally reviewed images and agree with radiology impression as follows:  CXR:   pa and lateral  ? PNA  CXR PA and Lateral:   09/01/2023 :    I personally reviewed images and impression is as follows:      Improved aeration bilaterally   Assessment

## 2023-09-01 ENCOUNTER — Ambulatory Visit (HOSPITAL_COMMUNITY)
Admission: RE | Admit: 2023-09-01 | Discharge: 2023-09-01 | Disposition: A | Discharge status: Home / Self Care | Source: Ambulatory Visit | Attending: Internal Medicine | Admitting: Internal Medicine

## 2023-09-01 ENCOUNTER — Encounter: Payer: Self-pay | Admitting: Internal Medicine

## 2023-09-01 ENCOUNTER — Ambulatory Visit: Admitting: Internal Medicine

## 2023-09-01 VITALS — BP 142/88 | HR 96 | Ht 75.0 in | Wt 176.2 lb

## 2023-09-01 DIAGNOSIS — J189 Pneumonia, unspecified organism: Secondary | ICD-10-CM | POA: Diagnosis not present

## 2023-09-01 DIAGNOSIS — J4550 Severe persistent asthma, uncomplicated: Secondary | ICD-10-CM

## 2023-09-01 DIAGNOSIS — R0600 Dyspnea, unspecified: Secondary | ICD-10-CM | POA: Diagnosis not present

## 2023-09-01 DIAGNOSIS — Z87891 Personal history of nicotine dependence: Secondary | ICD-10-CM

## 2023-09-01 DIAGNOSIS — J45909 Unspecified asthma, uncomplicated: Secondary | ICD-10-CM | POA: Diagnosis not present

## 2023-09-01 NOTE — Assessment & Plan Note (Signed)
 Stopped smoking  2011 spiro 2012 with FEV1 1.14 (28%), ratio 45 Alpha 1 (08/12/10) >>MM ([employer] )  IGE 1747, positive allergy  profile >> Elevated eos on CBC S/p intubation 10/2010 Job exposure to respiratory irritant - bleach, chicken Allergy  vaccine begun 11/09/2010. DC'd by pt 2013- resumed 2013> 1:50 GH; [facility]27[facility]- Status asthma, no vent. [facility]- status asthma CT 07/11/23 scattered centrilobular emphysema Nucala  therapy begun 2016- dc'd [facility] - Trial of theoph 11/19/13 - 12/10/2013 d/c and rx max gerd rx  Office spirometry 10/09/14- Severe obstructive disease- FVC 2.63/ 52%, FEV1 0.97/ 24%, FEV1/FVC 0.37, FEF25-75% 0.36/ 8% Eosinophils 14% Allergy  profile done 07/17/2014 showed very high total IgE 3283 FENO 03/18/16- 100    Office Spirometry 01/25/17-severe obstructive airways disease. FVC 3.52/68%, FEV1 1.80/43%, ratio or 0.51, FEF 25-75% 0.76/18% 0htuvayre trial 05/17/22 >>> Dupixent  stopped p 4 m in July 2024 and worse since off it "insurance would only cover half the year"  - 07/28/2023  breztri   - FENO   07/28/2023    25 on breztr 2bid and prednisone  20 mg daily  - 09/01/2023  After extensive coaching inhaler device,  effectiveness =    80% with hfa > continue breztri  and add back  dupixent  if insurance will cover.   DDX of  difficult airways management almost all start with A and  include Adherence, Ace Inhibitors, Acid Reflux, Active Sinus Disease, Alpha 1 Antitripsin deficiency, Anxiety masquerading as Airways dz,  ABPA,  Allergy (esp in young), Aspiration (esp in elderly), Adverse effects of meds,  Active smoking or vaping, A bunch of PE's (a small clot burden can't cause this syndrome unless there is already severe underlying pulm or vascular dz with poor reserve) plus two Bs  = Bronchiectasis and Beta blocker use..and one C= CHF'  Adherence is always the initial "prime suspect" and is a multilayered concern that requires a "trust but  verify" approach in every patient - starting with knowing how to use medications, especially inhalers, correctly, keeping up with refills and understanding the fundamental difference between maintenance and prns vs those medications only taken for a very short course and then stopped and not refilled.  - see hfa teaching   Allergy  > continue high dose ICS/ pred as Plan D as above, and add dupixent  back if insurance will cover, if not consider refer to Dr Idolina Maker for alternatives.   ? Acid (or non-acid) GERD > always difficult to exclude as up to 75% of pts in some series report no assoc GI/ Heartburn symptoms> rec continue max (24h)  acid suppression and diet restrictions/ reviewed      ? Anxiety/ depression > dx of exclusion   F/u  6-8 weeks,sooner if needed          Each maintenance medication was reviewed in detail including emphasizing most importantly the difference between maintenance and prns and under what circumstances the prns are to be triggered using an action plan format where appropriate.  Total time for H and P, chart review, counseling, reviewing hfa/ neb device(s) and generating customized AVS unique to this office visit / same day charting = 42 min  for high risk   refractory respiratory  symptoms of uncertain etiology

## 2023-09-01 NOTE — Patient Instructions (Addendum)
 Plan A = Automatic = Always=    Breztri  Take 2 puffs first thing in am and then another 2 puffs about 12 hours later and continue Ohtuvayre  twice daily   Start back on Dipixent  300 mg every 2 weeks    Plan B = Backup (to supplement plan A, not to replace it) Only use your albuterol  inhaler as a rescue medication to be used if you can't catch your breath by resting or doing a relaxed purse lip breathing pattern.  - The less you use it, the better it will work when you need it. - Ok to use the inhaler up to 2 puffs  every 4 hours if you must but call for appointment if use goes up over your usual need - Don't leave home without it !!  (think of it like the spare tire for your car)   Plan C = Crisis (instead of Plan B but only if Plan B stops working) - only use your levoalbuterol nebulizer if you first try Plan B and it fails to help > ok to use the nebulizer up to every 4 hours but if start needing it regularly call for immediate appointment  Plan D = Deltasone , when ABC not working for you  Prednisone  10 mg  2 each am until better then 1 daily x 5 days and stop   Continue Pantoprazole  (protonix ) 40 mg   Take  30-60 min before first meal of the day and Pepcid  (famotidine )  20 mg after supper         Please remember to go to the  x-ray department  @  [facility] for your tests - we will call you with the results when they are available         Please schedule a follow up office visit in 6-8  weeks, call sooner if needed

## 2023-09-06 ENCOUNTER — Telehealth: Payer: Self-pay

## 2023-09-06 NOTE — Telephone Encounter (Signed)
 Copied from CRM [id number]. Topic: Clinical - Medical Advice >> Sep 06, 2023  3:54 PM Crist Dominion wrote: Reason for CRM: Patient states he just received his Dupixent  in the mail and and does not know if he supposed to take a single or double does. Please call back and advise.

## 2023-09-06 NOTE — Telephone Encounter (Signed)
 Per Dr. Waymond Hailey patient can take single dose of Dupixent  to start back.  Spoke with patient and advised. Nothing further needed at this time.

## 2023-09-07 NOTE — Telephone Encounter (Signed)
 Copied from CRM [id number]. Topic: Clinical - Prescription Issue >> Sep 07, 2023 11:31 AM Isabell A wrote: Reason for CRM: Tye, case manager with Dupixent  [employer] calling in regard to page 2 of enrollment form, provider forgot to check mark the device type (pen or synringe). Requesting to please check the correct type & fax back. Patients second signature is also missing on page 1.  Dr Waymond Hailey please advise. I did not see this in the pt's med list either.

## 2023-09-07 NOTE — Addendum Note (Signed)
 Addended by: Alyse Bach on: 09/07/2023 04:55 PM   Modules accepted: Orders

## 2023-09-12 ENCOUNTER — Telehealth: Payer: Self-pay

## 2023-09-12 NOTE — Telephone Encounter (Signed)
 Copied from CRM [id number]. Topic: Clinical - Prescription Issue >> Sep 07, 2023 11:31 AM Isabell A wrote: Reason for CRM: Tye, case manager with [employer] calling in regard to page 2 of enrollment form, provider forgot to check mark the device type (pen or synringe). Requesting to please check the correct type & fax back. Patients second signature is also missing on page 1. >> Sep 12, 2023  1:49 PM Margarette Shawl wrote: Jaynie Meyers, case manager with [employer] is calling office again in reference to enrollment form faxed to clinic. Provider needs to specify device type on Page 2, and patient's second signature is missing from Pg 1. Pt can provide second signature on form prior to being faxed back over, or he can contact them by calling # [phone] and they will direct him to website where he can submit signature electronically.    FX# [phone]

## 2023-09-15 ENCOUNTER — Other Ambulatory Visit (HOSPITAL_COMMUNITY): Payer: Self-pay

## 2023-09-15 NOTE — Telephone Encounter (Signed)
 Pharmacy team was not notified of this Dupixent  re start again. Paperwork was sent to [employer] with many incomplete parts.  I've printed and completed form. Faxed to [employer].   Phone #: [phone] Fax #: [phone]  Geraldene Kleine, PharmD, MPH, BCPS, CPP Clinical Pharmacist (Rheumatology and Pulmonology)

## 2023-09-22 ENCOUNTER — Telehealth: Payer: Self-pay | Admitting: Internal Medicine

## 2023-09-22 ENCOUNTER — Other Ambulatory Visit: Payer: Self-pay | Admitting: Internal Medicine

## 2023-09-22 NOTE — Telephone Encounter (Signed)
 Patient provided with one sample of Breztri .  Per [facility] they got shipment in today and will prepare refill and let patient know. Nothing further needed at this time.

## 2023-09-22 NOTE — Telephone Encounter (Signed)
 Patient was a walk in today asking for a sample of Breztri ---he states he had requested a refill from [facility] a week ago and they still do not have the medication in stock

## 2023-10-09 NOTE — Progress Notes (Unsigned)
 Drew Sandoval, male    DOB: [birth year]    MRN: [id number]   Brief patient profile:  47  yowb  quit smoking  2011 at onset of asthma  while working [employer] (p 3rd year working there and continues to do so)  referred to pulmonary clinic in [place]  06/23/2023 by [facility]   for asthma  already maint on dupixent  and ohtuvayre .  Dr Linder Revere Pt  transferred to [facility] office for convenience   Admit date: 06/04/2023 Discharge date: 06/06/2023     Recommendations for Outpatient Follow-up:  Follow up with pulmonology, Dr. Waymond Hailey as scheduled in February Continue on Decadron  for 5 days as prescribed Finish course of Levaquin  with 4 more days as prescribed Refills given on albuterol  inhaler to use for shortness of breath or wheezing Lidocaine  patch given for left-sided chest pain Continue other home medications as prior Patient remains high risk for readmission    Brief/Interim Summary: Drew Sandoval is a 48 y.o. male with medical history significant for severe persistent asthma, hyper IgE syndrome, SVT status post ablation, hypertension, and essential tremor who has apparently been struggling with waxing and waning symptoms of shortness of breath along with cough over the last 1.5 months.  He states he has had worsening shortness of breath as well as left-sided chest pain that has worsened over the last several days.  He has been admitted for acute hypoxemic respiratory failure secondary to suspected community-acquired pneumonia with associated asthma exacerbation.  He is overall feeling much better today and is eager for discharge.  He no longer has any oxygen  requirements.  He will follow-up with pulmonology outpatient.     Discharge Diagnoses:  Principal Problem:   Acute hypoxemic respiratory failure (HCC):   Asthma, severe persistent   Allergic eosinophilia   Hyper-IgE syndrome (HCC)   SVT (supraventricular tachycardia) (HCC)   Hypertension, essential   Essential tremor    Thyroid  nodule   Acute asthma exacerbation   Lobar pneumonia (HCC)   Principal discharge diagnosis: Acute hypoxemic respiratory failure secondary to community-acquired pneumonia and acute asthma exacerbation.     History of Present Illness  06/23/2023  Pulmonary/ 1st office eval/ Drew Sandoval / [place] [facility] on Breztri  and Ohtuvayre  (one month).  Did not feel dupiexent worked p 4 m so stopped in July 2024 by insurance  Chief Complaint  Patient presents with   Asthma  Dyspnea:  x 100 ft slow pace  Cough: lots of coughing x July 2014 dry sounding worse with exertion and lie down x sev hours  / prednisone  helps a lot > min mucoid production  Sleep: bed is flat with 3 pillows   SABA use: on job uses hfa up 4-5 times in  8 h and at home once at home / neb avg once every other day  02: none Rec Plan A = Automatic = Always=    Breztri  Take 2 puffs first thing in am and then another 2 puffs about 12 hours later.   Work on inhaler technique:   >>>  Remember how golfers warm up by taking practice swings - do this with an empty inhaler  Plan B = Backup (to supplement plan A, not to replace it) Only use your albuterol  inhaler as a rescue medication  Plan C = Crisis (instead of Plan B but only if Plan B stops working) - only use your levoalbuterol nebulizer if you first try Plan B  Prednisone  10 mg  2 each am until better then 1  daily x 5 days and stop  Panntoprazole (protonix ) 40 mg   Take  30-60 min before first meal of the day and Pepcid  (famotidine )  20 mg after supper until return to office   GERD diet reviewed, bed blocks rec  Please schedule a follow up office visit in 4 weeks, sooner if needed  with all medications /inhalers/ solutions in hand    Admit date:     07/11/2023  Discharge date: 07/17/23      Recommendations at discharge:   Please follow up with primary care provider within 1-2 weeks  Please repeat BMP and CBC in one week         Hospital Course: 48 y.o. male former smoker  with medical history significant for severe asthma and hypertension.  He started smoking at age 26.  Patient presented to the ED with complaints of difficulty breathing with associated productive cough.  Symptoms have been ongoing and progressing over the past 2 to 3 weeks.  Reports symptoms started after he completed his last dose of antibiotics at home.  Reports chronic left-sided chest pain that is nonradiating.  Reports poor oral intake, no vomiting or diarrhea.  Does not smoke cigarettes anymore.   Recent hospitalization 1/25 to 1/27 for acute hypoxic respiratory failure secondary to pneumonia and asthma exacerbation.  Was treated with Levaquin  was continued on discharge, but tolerated an initial dose of cefepime  in the ED.  He followed up with pulmonologist Dr. Waymond Hailey on discharge as recommended.     Assessment and Plan: Acute respiratory failure with hypoxia -Secondary to asthma exacerbation -Stable on 3 L>>2L>>RA -Viral respiratory panel +flu A/H1 -Wean oxygen  as tolerated -ambulated on RA on day of d/c without desaturation <95%   Asthma/COPD Exacerbation--Influenza pneumonitis -has 30 pack yrs tobacco -continue albuterol  neb -added yupelri  -started pulmicort  and brovana  -increase pulmicort  to 0.5 mg bid -continue singulair  -continue IV dexamethasone >>d/c home with po dexamethasone  -COVID-19/RSV/Flu--neg -continue ensifentrine   -PCT <0.10 -started oseltamivir --finished 4/5 days in hospital -continue hycodan for cough   Essential Tremor -continue mysoline    Hypertension -continue amlodipine    Multinodular goiter -needs outpt follow up with endocrine  -3/4 thyroid  US --multinodular goiter -TSH 0.311 -check Free T4--0.95   Hx SVT s/p ablation -currently in sinus            07/21/2023 post hosp  f/u ov/[place] office/Drew Sandoval re: severe asthma  maint on Breztri  /ohtuvayre   did  bring  some  meds    Chief Complaint  Patient presents with   Follow-up    4 wk follow up  for asthma   Dyspnea:  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 Cough:  discolored slightly  Sleeping: bed flat but using 6 pillows doing better in terms of noct cough/ wheeze  SABA use: up to 3 x daily/  neb when home  02: none  Rec Plan A = Automatic = Always=    Breztri  Take 2 puffs first thing in am and then another 2 puffs about 12 hours later and continue Ohtuvayre    Plan B = Backup (to supplement plan A, not to replace it) Only use your albuterol  inhaler as a rescue medication  Plan C = Crisis (instead of Plan B but only if Plan B stops working) - only use your levoalbuterol nebulizer if you first try Plan B and it fails to help > ok to use the nebulizer up to every 4 hours but if start needing it regularly call for immediate appointment Prednisone  10 mg  2  each am until better then 1 daily x 5 days and stop  Pantoprazole  (protonix ) 40 mg   Take  30-60 min before first meal of the day and Pepcid  (famotidine )  20 mg after supper until return to office - this is the best way to tell whether stomach acid is contributing to your problem.   GERD diet reviewed, bed blocks rec  Omnicef  300 mg twice daily x 10 days should clear up your sinus drainage > welps   Please schedule a follow up office visit in 6 weeks, sooner if needed  with all medications /inhalers/ solutions in hand so we can verify exactly what you are taking. 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  Dyspnea:  still at work cleaning  Cough: cloudy  Sleeping: bed is flat/ 3 pillows under s  resp cc  SABA use: much less  02: none  Rec Plan A = Automatic = Always=    Breztri  Take 2 puffs first thing in am and then another 2 puffs about 12 hours later and continue Ohtuvayre  twice daily  Plan B = Backup (to supplement  plan A, not to replace it) Only use your albuterol  inhaler as a rescue medication  Plan C = Crisis (instead of Plan B but only if Plan B stops working) - only use your levoalbuterol nebulizer if you first try Plan B  Plan D = Deltasone , when ABC not working for you  Prednisone  10 mg  2 each am until better then 1 daily x 5 days and stop  Pantoprazole  (protonix ) 40 mg   Take  30-60 min before first meal of the day and Pepcid  (famotidine )  20 mg after supper until return to office  GERD diet reviewed, bed blocks rec       08/23/22 to UC with "sev days of cough/ sob/ wheeze/ sneeze" and did not activate ABCD plan and told ER doc pulmonary stopped treating his allergies rx 40 mg daily x 5 days    09/01/2023  f/u ov/[place] office/Drew Sandoval re: ACOS  maint on breztri / proair /   Chief Complaint  Patient presents with   Asthma  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  Cough: none  Sleeping: bed flat/ props up on 3 pillows  resp cc  SABA use: none since am 08/31/23  02: none   Rec Plan A = Automatic = Always=    Breztri  Take 2 puffs first thing in am and then another 2 puffs about 12 hours later and continue Ohtuvayre  twice daily  Start back on Dipixent  300 mg every 2 weeks   Plan B = Backup (to supplement plan A, not to replace it) Only use your albuterol  inhaler as a rescue medication  Plan C = Crisis (instead of Plan B but only if  Plan B stops working) - only use your levoalbuterol nebulizer if you first try Plan B and it fails to help > ok to use the nebulizer up to every 4 hours but if start needing it regularly call for  immediate appointment Plan D = Deltasone , when ABC not working for you  Prednisone  10 mg  2 each am until better then 1 daily x 5 days and stop  Continue Pantoprazole  (protonix ) 40 mg   Take  30-60 min before first meal of the day and Pepcid  (famotidine )  20 mg after supper   - see dentist   Cxr: 1. Improved, but not resolved, multifocal lower lung pneumonia.     10/13/2023  f/u ov/[place] office/Drew Sandoval re: asthma  maint on breztri  and dupixent  2nd dose May 27th  started Dupixent    Since last ov multiple teeth pulled > nasal congestion improved but still having sinus ha (points to top of nose at forehead intersection) but no purulent secretions  Chief Complaint  Patient presents with   Follow-up    Yellow mucus   Asthma   Dyspnea:  washes machines night shift ok  Cough: still coughing a lot  Sleeping: flat bed/ props on 3 pillows due to cough    resp cc  SABA use: not using  02: none    No obvious day to day or daytime variability or assoc excess/ purulent sputum or mucus plugs or hemoptysis or cp or chest tightness, subjective wheeze or overt sinus or hb symptoms.    Also denies any obvious fluctuation of symptoms with weather or environmental changes or other aggravating or alleviating factors except as outlined above   No unusual exposure hx or h/o childhood pna/ asthma or knowledge of premature birth.  Current Allergies, Complete Past Medical History, Past Surgical History, Family History, and Social History were reviewed in [employer] record.  ROS  The following are not active complaints unless bolded Hoarseness, sore throat, dysphagia, dental problems, itching, sneezing,  nasal congestion or discharge of excess mucus or purulent secretions, ear ache,   fever, chills, sweats, unintended wt loss or wt gain, classically pleuritic or exertional cp,  orthopnea pnd or arm/hand swelling  or leg swelling, presyncope, palpitations, abdominal pain, anorexia, nausea,  vomiting, diarrhea  or change in bowel habits or change in bladder habits, change in stools or change in urine, dysuria, hematuria,  rash, arthralgias, visual complaints, headache, numbness, weakness or ataxia or problems with walking or coordination,  change in mood or  memory.        Current Meds  Medication Sig   acetaminophen  (TYLENOL ) 500 MG tablet Take 1,000 mg by mouth every 6 (six) hours as needed for moderate pain.   albuterol  (VENTOLIN  HFA) 108 (90 Base) MCG/ACT inhaler Inhale 2 puffs into the lungs every 6 (six) hours as needed for wheezing or shortness of breath.   amLODipine  (NORVASC ) 5 MG tablet Take 1 tablet (5 mg total) by mouth daily.   Budeson-Glycopyrrol-Formoterol  (BREZTRI  AEROSPHERE) 160-9-4.8 MCG/ACT AERO Inhale 2 puffs into the lungs 2 (two) times daily. Rinse mouth   DUPIXENT  300 MG/2ML SOAJ Inject into the skin.   EPINEPHrine  0.3 mg/0.3 mL IJ SOAJ injection Inject into thigh for severe allergic reaction   famotidine  (PEPCID ) 20 MG tablet One after supper (Patient taking differently: Take 20 mg by mouth at bedtime.)   montelukast  (SINGULAIR ) 10 MG tablet Take 1 tablet (10 mg total) by mouth at bedtime. (Patient taking differently: Take 10 mg by mouth  daily.)   pantoprazole  (PROTONIX ) 40 MG tablet TAKE 1 TABLET (40 MG TOTAL) BY MOUTH DAILY. TAKE 30-60 MIN BEFORE FIRST MEAL OF THE DAY   predniSONE  (DELTASONE ) 10 MG tablet 2 each am until better then 1 x 5 days and stop   primidone  (MYSOLINE ) 50 MG tablet TAKE 4 TABLETS TWICE A DAY   promethazine -dextromethorphan  (PROMETHAZINE -DM) 6.25-15 MG/5ML syrup Take 5 mLs by mouth 4 (four) times daily as needed for cough. Do not take with alcohol or while driving or operating heavy machinery.  May cause drowsiness.   [DISCONTINUED] Ensifentrine  (OHTUVAYRE ) 3 MG/2.5ML SUSP Inhale 2.5 mLs into the lungs 2 (two) times daily.          Past Medical History:  Diagnosis Date   Acute conjunctivitis of right eye 10/07/2014   Acute  respiratory failure with hypoxia (HCC) 07/23/2013   Asthma    Asthma    [employer]rees[employer] 06/26/2020   Asthma, severe persistent 07/16/2010   PFT >> spiro 2012 with FEV1 1.14 (28%), ratio 45 Alpha 1 (08/12/10) >>MM ([facility] )  IGE 1747, positive allergy  profile >> Elevated eos on CBC S/p intubation 10/2010 Job exposure to respiratory irritant - bleach, chicken Allergy  vaccine begun 11/09/2010. DC'd by pt 2013- resumed 2013> 1:50 GH; DC'd [facility] 9/24-9/27/ 2013- Status asthma, no vent. [facility] 2015- status ast   COVID-19 virus infection 06/17/2020   January, 2022. 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   SVT (supraventricular tachycardia) (HCC) 09/24/2014      Objective:    Wts  10/13/2023          185  09/01/2023        176   07/28/2023        177   07/21/2023       170  07/11/23 175 lb (79.4 kg)  06/23/23 175 lb (79.4 kg)  06/04/23 186 lb 15.2 oz (84.8 kg)    Vital signs reviewed  10/13/2023  - Note at rest 02 sats  98% on RA   General appearance:   somber amb thin bm nad    HEENT : Oropharynx  clear   Nasal turbinates mp secrestions on L    NECK :  without  apparent JVD/ palpable Nodes/TM    LUNGS: no acc muscle use,  Min barrel  contour chest wall with bilateral  slightly decreased bs s audible wheeze and  without cough on insp or exp maneuvers and min  Hyperresonant  to  percussion bilaterally    CV:  RRR  no s3 or murmur or increase in P2, and no edema   ABD:  soft and nontender with pos end  insp Hoover's  in the supine position.  No bruits or organomegaly appreciated   MS:  Nl gait/ ext warm without deformities Or obvious joint  restrictions  calf tenderness, cyanosis or clubbing     SKIN: warm and dry without lesions    NEURO:  alert, approp, nl sensorium with  no motor or cerebellar deficits apparent.                Assessment

## 2023-10-13 ENCOUNTER — Encounter: Payer: Self-pay | Admitting: Internal Medicine

## 2023-10-13 ENCOUNTER — Ambulatory Visit: Admitting: Internal Medicine

## 2023-10-13 VITALS — BP 162/99 | HR 71 | Ht 75.0 in | Wt 185.2 lb

## 2023-10-13 DIAGNOSIS — J320 Chronic maxillary sinusitis: Secondary | ICD-10-CM | POA: Diagnosis not present

## 2023-10-13 DIAGNOSIS — J4550 Severe persistent asthma, uncomplicated: Secondary | ICD-10-CM

## 2023-10-13 MED ORDER — DOXYCYCLINE HYCLATE 100 MG PO TABS: 100.0000 mg | ORAL_TABLET | Freq: Two times a day (BID) | ORAL | 0 refills | Status: DC

## 2023-10-13 MED ORDER — PREDNISONE 10 MG PO TABS: ORAL_TABLET | ORAL | 0 refills | Status: DC

## 2023-10-13 NOTE — Assessment & Plan Note (Signed)
 Stopped smoking  2011 spiro 2012 with FEV1 1.14 (28%), ratio 45 Alpha 1 (08/12/10) >>MM ([employer] )  IGE 1747, positive allergy  profile >> Elevated eos on CBC S/p intubation 10/2010 Job exposure to respiratory irritant - bleach, chicken Allergy  vaccine begun 11/09/2010. DC'd by pt 2013- resumed 2013> 1:50 GH; DC'd [facility] 9/24-9/27/ 2013- Status asthma, no vent. [facility]- status asthma CT 07/11/23 scattered centrilobular emphysema Nucala  therapy begun 2016- dc'd [facility] - Trial of theoph 11/19/13 - 12/10/2013 d/c and rx max gerd rx  Office spirometry 10/09/14- Severe obstructive disease- FVC 2.63/ 52%, FEV1 0.97/ 24%, FEV1/FVC 0.37, FEF25-75% 0.36/ 8% Eosinophils 14% Allergy  profile done 07/17/2014 showed very high total IgE 3283 FENO 03/18/16- 100    Office Spirometry 01/25/17-severe obstructive airways disease. FVC 3.52/68%, FEV1 1.80/43%, ratio or 0.51, FEF 25-75% 0.76/18% 0htuvayre trial 05/17/22 >>> Dupixent  stopped p 4 m in July 2024 and worse since off it "insurance would only cover half the year"  - 07/28/2023  breztri   - FENO   07/28/2023    25 on breztr 2bid and prednisone  20 mg daily  - 09/01/2023  After extensive coaching inhaler device,  effectiveness =    80% with hfa > continue breztri    - Dupixent  restarted mid May 2025   Finally:  All goals of chronic asthma control met including optimal function and elimination of symptoms with minimal need for rescue therapy.  Contingencies discussed in full including contacting this office immediately if not controlling the symptoms using the rule of two's.     ABCD rules reviewed from last ov and refilled pred 10 mg x 100 emphasizing it should not be needed but also should not be taken off his prn med list   F/u q 3 m, sooner prn

## 2023-10-13 NOTE — Patient Instructions (Signed)
 Doxyx 10 days take before eating with large glass of water   If not better call for sinus CT   Please schedule a follow up visit in 3 months but call sooner if needed  with all respiratory medications /inhalers/ solutions in hand so we can verify exactly what you are taking.

## 2023-10-13 NOTE — Assessment & Plan Note (Signed)
 S//p multiple upper tooth extraction may 2025   He still has nasal congestion and  MP secretions L side and very pcn allergy  so rx with doxy x 10 d and f/u with sinus CT if not better - alternative would be prolonged rx with clindamycin  but that would risk c diff and would see ENT for that   Each maintenance medication was reviewed in detail including emphasizing most importantly the difference between maintenance and prns and under what circumstances the prns are to be triggered using an action plan format where appropriate.  Total time for H and P, chart review, counseling, reviewing hfa/ neb  device(s) and generating customized AVS unique to this office visit / same day charting = 35 min for previously refractory asthma

## 2023-11-01 NOTE — Progress Notes (Unsigned)
 Assessment/Plan:    1.  Essential Tremor, exacerbated by medication for asthma  -feels better after decreasing caffeine  -I had intended him to take primidone , 4 tablets in the morning, 4 tablets at night but he is taking 3 in the AM/4 at bed and feels like doing okay right now  -when he ran out of primidone  tremor was much worse  -understands that tremor likely will never be fully under control due to other medications that induce tremor, including prednisone  currently   -cardiology started him on low dose metoprolol  for SVT.  It is probably too low dose to help tremor but given severity of asthma, wouldn't want to increase BB even if able.  -Discussed again with the patient that there really is not much more medical wise that we can do for his tremor.  His fiancee came for the first time today and we again discussed dbs vs focused ultrasound in detail.  He states today he is thinking about it.  The L hand is much worse than the right and he would just need unilateral R VIM surgery (he is L handed).     2.  Severe Asthma  -Medications for this contribute to tremor  -reports that his insurance only covers his asthma meds through July and then he has to take prednisone  July - end of year, which makes #1 more difficult  3.  SVT  -Following with cardiology.  4.  F/u 6-9 months  Subjective:   Drew Sandoval was seen today in follow up for essential tremor, which is much exacerbated by medication for his asthma. Pt with fiancee who supplements hx.  He is taking the primidone .  Has decreased his caffeine use and found that to be helpful.  He was supposed to increase his dose to 4in the AM and PM but he didn't.  He works 3rd shift.  He was out of his meds for a few days and I couldn't write a thing     Current prescribed movement disorder medications: Primidone , 50 mg, 4 tablets twice per day   PREVIOUS MEDICATIONS: primidone ; artane  (thought caused hand/feet itching); Topamax (not  tried because of nephrolithiasis); Cogentin  (discontinued because of sexual side effects; gabapentin  (not helpful)  ALLERGIES:   Allergies  Allergen Reactions   Azithromycin  Rash   Banana Anaphylaxis and Swelling    Throat swelling    Solu-Medrol  [Methylprednisolone ] Itching    States his whole body itches   Omnicef  [Cefdinir ]     Severe whelps   Alvesco  [Ciclesonide ] Rash   Anoro Ellipta  [Umeclidinium-Vilanterol] Rash   Penicillins Rash    CURRENT MEDICATIONS:  Outpatient Encounter Medications as of 11/02/2023  Medication Sig   acetaminophen  (TYLENOL ) 500 MG tablet Take 1,000 mg by mouth every 6 (six) hours as needed for moderate pain.   albuterol  (VENTOLIN  HFA) 108 (90 Base) MCG/ACT inhaler Inhale 2 puffs into the lungs every 6 (six) hours as needed for wheezing or shortness of breath.   amLODipine  (NORVASC ) 5 MG tablet Take 1 tablet (5 mg total) by mouth daily.   Budeson-Glycopyrrol-Formoterol  (BREZTRI  AEROSPHERE) 160-9-4.8 MCG/ACT AERO Inhale 2 puffs into the lungs 2 (two) times daily. Rinse mouth   doxycycline  (VIBRA -TABS) 100 MG tablet Take 1 tablet (100 mg total) by mouth 2 (two) times daily. Take before meals with water   DUPIXENT  300 MG/2ML SOAJ Inject into the skin.   EPINEPHrine  0.3 mg/0.3 mL IJ SOAJ injection Inject into thigh for severe allergic reaction   famotidine  (PEPCID ) 20  MG tablet One after supper (Patient taking differently: Take 20 mg by mouth at bedtime.)   montelukast  (SINGULAIR ) 10 MG tablet Take 1 tablet (10 mg total) by mouth at bedtime. (Patient taking differently: Take 10 mg by mouth daily.)   pantoprazole  (PROTONIX ) 40 MG tablet TAKE 1 TABLET (40 MG TOTAL) BY MOUTH DAILY. TAKE 30-60 MIN BEFORE FIRST MEAL OF THE DAY   predniSONE  (DELTASONE ) 10 MG tablet 2 each am until better then 1 x 5 days and stop   primidone  (MYSOLINE ) 50 MG tablet TAKE 4 TABLETS TWICE A DAY   promethazine -dextromethorphan  (PROMETHAZINE -DM) 6.25-15 MG/5ML syrup Take 5 mLs by mouth 4  (four) times daily as needed for cough. Do not take with alcohol or while driving or operating heavy machinery.  May cause drowsiness.   No facility-administered encounter medications on file as of 11/02/2023.     Objective:    PHYSICAL EXAMINATION:    VITALS:   There were no vitals filed for this visit.    GEN:  The patient appears stated age and is in NAD. HEENT:  Normocephalic, atraumatic.  The mucous membranes are moist.   Neurological examination:  Orientation: The patient is alert and oriented x3. Cranial nerves: There is good facial symmetry. The speech is fluent and clear. Soft palate rises symmetrically and there is no tongue deviation. Hearing is intact to conversational tone. Sensation: Sensation is intact to light touch throughout Motor: Strength is at least antigravity x4.  Movement examination: Tone: There is normal tone in the UE/LE Abnormal movements: L>>>R intention tremor.  This is stable c/t previous.  Has trouble with archimedes spirals on the L  Coordination:  There is no decremation with RAM's Gait and Station: The patient has no difficulty arising out of a deep-seated chair without the use of the hands. The patient's stride length is good I have reviewed and interpreted the following labs independently   Chemistry      Component Value Date/Time   NA 135 07/14/2023 0437   K 3.6 07/14/2023 0437   CL 108 07/14/2023 0437   CO2 23 07/14/2023 0437   BUN 18 07/14/2023 0437   CREATININE 0.67 07/14/2023 0437      Component Value Date/Time   CALCIUM 8.5 (L) 07/14/2023 0437   ALKPHOS 62 07/11/2023 1236   AST 14 (L) 07/11/2023 1236   ALT 19 07/11/2023 1236   BILITOT 0.4 07/11/2023 1236      Lab Results  Component Value Date   WBC 11.5 (H) 07/12/2023   HGB 12.6 (L) 07/12/2023   HCT 38.5 (L) 07/12/2023   MCV 93.2 07/12/2023   PLT 223 07/12/2023   Lab Results  Component Value Date   TSH 0.311 (L) 07/11/2023     Chemistry      Component Value  Date/Time   NA 135 07/14/2023 0437   K 3.6 07/14/2023 0437   CL 108 07/14/2023 0437   CO2 23 07/14/2023 0437   BUN 18 07/14/2023 0437   CREATININE 0.67 07/14/2023 0437      Component Value Date/Time   CALCIUM 8.5 (L) 07/14/2023 0437   ALKPHOS 62 07/11/2023 1236   AST 14 (L) 07/11/2023 1236   ALT 19 07/11/2023 1236   BILITOT 0.4 07/11/2023 1236     Total time spent on today's visit was *** minutes, including both face-to-face time and nonface-to-face time.  Time included that spent on review of records (prior notes available to me/labs/imaging if pertinent), discussing treatment and goals, answering patient's questions and  coordinating care.     Cc:  Pcp, No

## 2023-11-02 ENCOUNTER — Ambulatory Visit (INDEPENDENT_AMBULATORY_CARE_PROVIDER_SITE_OTHER): Payer: BC Managed Care – PPO | Admitting: Neurology

## 2023-11-02 ENCOUNTER — Encounter: Payer: Self-pay | Admitting: Neurology

## 2023-11-02 DIAGNOSIS — G250 Essential tremor: Secondary | ICD-10-CM | POA: Diagnosis not present

## 2023-11-02 NOTE — Patient Instructions (Addendum)
 Cut back on the [facility]!  Dr. Jacqulyn Ahle [facility] [address] [place], [place] [address] [phone]

## 2023-11-16 ENCOUNTER — Ambulatory Visit: Attending: Internal Medicine | Admitting: Internal Medicine

## 2023-11-16 ENCOUNTER — Telehealth: Payer: Self-pay | Admitting: Internal Medicine

## 2023-11-16 VITALS — BP 126/80 | HR 72 | Ht 75.0 in | Wt 176.6 lb

## 2023-11-16 DIAGNOSIS — I10 Essential (primary) hypertension: Secondary | ICD-10-CM | POA: Diagnosis not present

## 2023-11-16 MED ORDER — AMLODIPINE BESYLATE 5 MG PO TABS: 5.0000 mg | ORAL_TABLET | Freq: Every day | ORAL | 3 refills | Status: AC

## 2023-11-16 NOTE — Telephone Encounter (Unsigned)
 Copied from CRM [id number]. Topic: Clinical - Medication Refill >> Nov 16, 2023  2:56 PM Isabell A wrote: Medication: albuterol  (VENTOLIN  HFA) 108 (90 Base) MCG/ACT inhaler  Has the patient contacted their pharmacy? Yes (Agent: If no, request that the patient contact the pharmacy for the refill. If patient does not wish to contact the pharmacy document the reason why and proceed with request.) (Agent: If yes, when and what did the pharmacy advise?)  This is the patient's preferred pharmacy:  [id number] - [place], [place] - [address] AT [facility] [address] [place] [address] Phone: [phone] Fax: [phone]   Is this the correct pharmacy for this prescription? Yes If no, delete pharmacy and type the correct one.   Has the prescription been filled recently? Yes  Is the patient out of the medication? Yes  Has the patient been seen for an appointment in the last year OR does the patient have an upcoming appointment? Yes  Can we respond through MyChart? Yes  Agent: Please be advised that Rx refills may take up to 3 business days. We ask that you follow-up with your pharmacy.

## 2023-11-16 NOTE — Patient Instructions (Signed)
 Medication Instructions:  Your physician recommends that you continue on your current medications as directed. Please refer to the Current Medication list given to you today.  *If you need a refill on your cardiac medications before your next appointment, please call your pharmacy*  Lab Work: NONE   If you have labs (blood work) drawn today and your tests are completely normal, you will receive your results only by: MyChart Message (if you have MyChart) OR A paper copy in the mail If you have any lab test that is abnormal or we need to change your treatment, we will call you to review the results.  Testing/Procedures: NONE   Follow-Up: At [facility], you and your health needs are our priority.  As part of our continuing mission to provide you with exceptional heart care, our providers are all part of one team.  This team includes your primary Cardiologist (physician) and Advanced Practice Providers or APPs (Physician Assistants and Nurse Practitioners) who all work together to provide you with the care you need, when you need it.  Your next appointment:   As Needed   Provider:   Danelle Birmingham, MD    We recommend signing up for the patient portal called MyChart.  Sign up information is provided on this After Visit Summary.  MyChart is used to connect with patients for Virtual Visits (Telemedicine).  Patients are able to view lab/test results, encounter notes, upcoming appointments, etc.  Non-urgent messages can be sent to your provider as well.   To learn more about what you can do with MyChart, go to [email or web address].   Other Instructions Thank you for choosing [facility]!

## 2023-11-16 NOTE — Progress Notes (Signed)
 HPI Drew Sandoval returns today for followup. 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 He developed HTN. He was placed on metoprolol . His bp is elevated today but he notes that it is usually well controlled. He notes that his asthma has been better.   Allergies  Allergen Reactions   Azithromycin  Rash   Banana Anaphylaxis and Swelling    Throat swelling    Solu-Medrol  [Methylprednisolone ] Itching    States his whole body itches   Omnicef  [Cefdinir ]     Severe whelps   Alvesco  [Ciclesonide ] Rash   Anoro Ellipta  [Umeclidinium-Vilanterol] Rash   Penicillins Rash     Current Outpatient Medications  Medication Sig Dispense Refill   acetaminophen  (TYLENOL ) 500 MG tablet Take 1,000 mg by mouth every 6 (six) hours as needed for moderate pain.     albuterol  (VENTOLIN  HFA) 108 (90 Base) MCG/ACT inhaler Inhale 2 puffs into the lungs every 6 (six) hours as needed for wheezing or shortness of breath. 8 g 2   amLODipine  (NORVASC ) 5 MG tablet Take 1 tablet (5 mg total) by mouth daily. 90 tablet 0   Budeson-Glycopyrrol-Formoterol  (BREZTRI  AEROSPHERE) 160-9-4.8 MCG/ACT AERO Inhale 2 puffs into the lungs 2 (two) times daily. Rinse mouth 10.7 g 11   DUPIXENT  300 MG/2ML SOAJ Inject into the skin.     EPINEPHrine  0.3 mg/0.3 mL IJ SOAJ injection Inject into thigh for severe allergic reaction 1 each prn   famotidine  (PEPCID ) 20 MG tablet One after supper 30 tablet 11   montelukast  (SINGULAIR ) 10 MG tablet Take 1 tablet (10 mg total) by mouth at bedtime. 30 tablet 11   pantoprazole  (PROTONIX ) 40 MG tablet TAKE 1 TABLET (40 MG TOTAL) BY MOUTH DAILY. TAKE 30-60 MIN BEFORE FIRST MEAL OF THE DAY 30 tablet 2   predniSONE  (DELTASONE ) 10 MG tablet 2 each am until better then 1 x 5 days and stop 100 tablet 0   primidone  (MYSOLINE ) 50 MG tablet TAKE 4 TABLETS TWICE A DAY 720 tablet 0   promethazine -dextromethorphan  (PROMETHAZINE -DM) 6.25-15 MG/5ML syrup  Take 5 mLs by mouth 4 (four) times daily as needed for cough. Do not take with alcohol or while driving or operating heavy machinery.  May cause drowsiness. 118 mL 0   doxycycline  (VIBRA -TABS) 100 MG tablet Take 1 tablet (100 mg total) by mouth 2 (two) times daily. Take before meals with water (Patient not taking: Reported on 11/16/2023) 20 tablet 0   No current facility-administered medications for this visit.     Past Medical History:  Diagnosis Date   Acute conjunctivitis of right eye 10/07/2014   Acute respiratory failure with hypoxia (HCC) 07/23/2013   Asthma    Asthma    Phreesia 06/26/2020   Asthma, severe persistent 07/16/2010   PFT >> spiro 2012 with FEV1 1.14 (28%), ratio 45 Alpha 1 (08/12/10) >>MM ([facility] )  IGE 1747, positive allergy  profile >> Elevated eos on CBC S/p intubation 10/2010 Job exposure to respiratory irritant - bleach, chicken Allergy  vaccine begun 11/09/2010. DC'd by pt 2013- resumed 2013> 1:50 GH; DC'd [facility] 9/24-9/27/ 2013- Status asthma, no vent. [facility] 3/ 2015- status ast   COVID-19 virus infection 06/17/2020   January, 2022. No hosp, no MAB   Eczema, allergic 07/22/2011   Essential tremor 06/27/2020   Hypertension    Seasonal allergies    Seasonal and perennial allergic rhinitis 07/17/2014   FENO 03/18/16-  100  High, indicates allergic asthma   Sinusitis, maxillary, 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   Sinus CT 12/10/2013 >>>    Status asthmaticus 10/19/2010   Sustained SVT (HCC) 09/24/2014   SVT (supraventricular tachycardia) (HCC) 09/24/2014    ROS:   All systems reviewed and negative except as noted in the HPI.   Past Surgical History:  Procedure Laterality Date   None     SVT ABLATION N/A 09/07/2019   Procedure: SVT ABLATION;  Surgeon: Waddell Danelle ORN, MD;  Location: [facility] INVASIVE CV LAB;  Service: Cardiovascular;  Laterality: N/A;     Family History  Problem Relation Age of Onset   Lung cancer Mother    Emphysema Father     Arthritis Sister    Diabetes Maternal Grandmother    Thyroid  disease Neg Hx      Social History   Socioeconomic History   Marital status: Single    Spouse name: Not on file   Number of children: Not on file   Years of education: Not on file   Highest education level: Not on file  Occupational History   Occupation: sanitation    Employer: [employer]  Tobacco Use   Smoking status: Former    Current packs/day: 0.00    Average packs/day: 0.5 packs/day for 11.5 years (5.8 ttl pk-yrs)    Types: Cigarettes    Start date: 10/09/1998    Quit date: 04/09/2010    Years since quitting: 13.6   Smokeless tobacco: Former   Tobacco comments:    started at age 74.  1/2 ppd.    Vaping Use   Vaping status: Never Used  Substance and Sexual Activity   Alcohol use: Yes    Comment: rarely   Drug use: No   Sexual activity: Yes    Birth control/protection: None  Other Topics Concern   Not on file  Social History Narrative   Left handed   Two story home   Drinks caffeine   Social Drivers of Health   Financial Resource Strain: Not on file  Food Insecurity: No Food Insecurity (07/11/2023)   Hunger Vital Sign    Worried About Running Out of Food in the Last Year: Never true    Ran Out of Food in the Last Year: Never true  Transportation Needs: No Transportation Needs (07/11/2023)   PRAPARE - Administrator, Civil Service (Medical): No    Lack of Transportation (Non-Medical): No  Physical Activity: Not on file  Stress: Not on file  Social Connections: Not on file  Intimate Partner Violence: Not At Risk (07/11/2023)   Humiliation, Afraid, Rape, and Kick questionnaire    Fear of Current or Ex-Partner: No    Emotionally Abused: No    Physically Abused: No    Sexually Abused: No     BP 126/80   Pulse 72   Ht 6' 3 (1.905 m)   Wt 176 lb 9.6 oz (80.1 kg)   SpO2 98%   BMI 22.07 kg/m   Physical Exam:  Well appearing NAD HEENT: Unremarkable Neck:  No JVD, no  thyromegally Lymphatics:  No adenopathy Back:  No CVA tenderness Lungs:  Clear with no wheezes HEART:  Regular rate rhythm, no murmurs, no rubs, no clicks Abd:  soft, positive bowel sounds, no organomegally, no rebound, no guarding Ext:  2 plus pulses, no edema, no cyanosis, no clubbing Skin:  No rashes no nodules Neuro:  CN II through XII intact, motor grossly intact   Assess/Plan:  SVT - his symptoms appear to be controlled.He will undergo watchful waiting. Sinus brady - I would like for him to stay on the toprol  and I asked her to check his HR with his bp machine at least weekly and to notify me if his HR is running low. HTN - his bp is well controlled today. He will continue his norvasc . Asthma -his symptoms are well controlled.    Danelle Birmingham, MD

## 2023-11-17 MED ORDER — ALBUTEROL SULFATE HFA 108 (90 BASE) MCG/ACT IN AERS: 2.0000 | INHALATION_SPRAY | Freq: Four times a day (QID) | RESPIRATORY_TRACT | 2 refills | Status: DC | PRN

## 2023-11-17 NOTE — Telephone Encounter (Signed)
 Tried calling the pt, no answer- ldvm. Refill has been sent. Nfn

## 2023-11-17 NOTE — Telephone Encounter (Signed)
 Patient returning call he received yesterday did relay medicine was sent

## 2023-11-29 ENCOUNTER — Other Ambulatory Visit: Payer: Self-pay | Admitting: Internal Medicine

## 2024-01-04 NOTE — Progress Notes (Unsigned)
 Kolton Kienle, male    DOB: 12/27/1975    MRN: [id number]   Brief patient profile:  48  yowb  quit smoking  2011 at onset of asthma  while working [employer] (p 3rd year working there and continues to do so)  referred to pulmonary clinic in [place]  06/23/2023 by [facility]   for asthma  already maint on dupixent  and ohtuvayre .  Dr Neysa Pt  transferred to [facility] office for convenience   Admit date: 06/04/2023 Discharge date: 06/06/2023     Recommendations for Outpatient Follow-up:  Follow up with pulmonology, Dr. Darlean as scheduled in February Continue on Decadron  for 5 days as prescribed Finish course of Levaquin  with 4 more days as prescribed Refills given on albuterol  inhaler to use for shortness of breath or wheezing Lidocaine  patch given for left-sided chest pain Continue other home medications as prior Patient remains high risk for readmission    Brief/Interim Summary: Keyden Pavlov is a 48 y.o. male with medical history significant for severe persistent asthma, hyper IgE syndrome, SVT status post ablation, hypertension, and essential tremor who has apparently been struggling with waxing and waning symptoms of shortness of breath along with cough over the last 1.5 months.  He states he has had worsening shortness of breath as well as left-sided chest pain that has worsened over the last several days.  He has been admitted for acute hypoxemic respiratory failure secondary to suspected community-acquired pneumonia with associated asthma exacerbation.  He is overall feeling much better today and is eager for discharge.  He no longer has any oxygen  requirements.  He will follow-up with pulmonology outpatient.     Discharge Diagnoses:  Principal Problem:   Acute hypoxemic respiratory failure (HCC):   Asthma, severe persistent   Allergic eosinophilia   Hyper-IgE syndrome (HCC)   SVT (supraventricular tachycardia) (HCC)   Hypertension, essential   Essential tremor    Thyroid  nodule   Acute asthma exacerbation   Lobar pneumonia (HCC)   Principal discharge diagnosis: Acute hypoxemic respiratory failure secondary to community-acquired pneumonia and acute asthma exacerbation.     History of Present Illness  06/23/2023  Pulmonary/ 1st office eval/ Katalia Choma / [place] [facility] on Breztri  and Ohtuvayre  (one month).  Did not feel dupiexent worked p 4 m so stopped in July 2024 by insurance  Chief Complaint  Patient presents with   Asthma  Dyspnea:  x 100 ft slow pace  Cough: lots of coughing x July 2014 dry sounding worse with exertion and lie down x sev hours  / prednisone  helps a lot > min mucoid production  Sleep: bed is flat with 3 pillows   SABA use: on job uses hfa up 4-5 times in  8 h and at home once at home / neb avg once every other day  02: none Rec Plan A = Automatic = Always=    Breztri  Take 2 puffs first thing in am and then another 2 puffs about 12 hours later.   Work on inhaler technique:   >>>  Remember how golfers warm up by taking practice swings - do this with an empty inhaler  Plan B = Backup (to supplement plan A, not to replace it) Only use your albuterol  inhaler as a rescue medication  Plan C = Crisis (instead of Plan B but only if Plan B stops working) - only use your levoalbuterol nebulizer if you first try Plan B  Prednisone  10 mg  2 each am until better then 1  daily x 5 days and stop  Panntoprazole (protonix ) 40 mg   Take  30-60 min before first meal of the day and Pepcid  (famotidine )  20 mg after supper until return to office   GERD diet reviewed, bed blocks rec  Please schedule a follow up office visit in 4 weeks, sooner if needed  with all medications /inhalers/ solutions in hand      07/28/2023  f/u ov/Tiffney Haughton re: 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  Dyspnea:  still at work cleaning  Cough: cloudy  Sleeping:  bed is flat/ 3 pillows under s  resp cc  SABA use: much less  02: none  Rec Plan A = Automatic = Always=    Breztri  Take 2 puffs first thing in am and then another 2 puffs about 12 hours later and continue Ohtuvayre  twice daily  Plan B = Backup (to supplement plan A, not to replace it) Only use your albuterol  inhaler as a rescue medication  Plan C = Crisis (instead of Plan B but only if Plan B stops working) - only use your levoalbuterol nebulizer if you first try Plan B  Plan D = Deltasone , when ABC not working for you  Prednisone  10 mg  2 each am until better then 1 daily x 5 days and stop  Pantoprazole  (protonix ) 40 mg   Take  30-60 min before first meal of the day and Pepcid  (famotidine )  20 mg after supper until return to office  GERD diet reviewed, bed blocks rec       08/23/22 to UC with sev days of cough/ sob/ wheeze/ sneeze and did not activate ABCD plan and told ER doc pulmonary stopped treating his allergies rx 40 mg daily x 5 days    09/01/2023  f/u ov/[place] office/Akayla Brass re: ACOS  maint on breztri / proair /   Chief Complaint  Patient presents with   Asthma  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  Cough: none  Sleeping: bed flat/ props up on 3 pillows  resp cc  SABA use: none since am 08/31/23  02: none   Rec Plan A = Automatic = Always=    Breztri  Take 2 puffs first thing in am and then another 2 puffs about 12 hours later and continue Ohtuvayre  twice daily  Start back on Dipixent  300 mg every 2 weeks   Plan B = Backup (to supplement plan A, not to replace it) Only use your albuterol  inhaler as a  rescue medication  Plan C = Crisis (instead of Plan B but only if Plan B stops working) - only use your levoalbuterol nebulizer if you first try Plan B and it fails to help > ok to use the nebulizer up to every 4 hours but if start needing it regularly call for immediate appointment Plan D = Deltasone , when ABC not working for you  Prednisone  10 mg  2  each am until better then 1 daily x 5 days and stop  Continue Pantoprazole  (protonix ) 40 mg   Take  30-60 min before first meal of the day and Pepcid  (famotidine )  20 mg after supper   - see dentist   Cxr: 1. Improved, but not resolved, multifocal lower lung pneumonia.     10/13/2023  f/u ov/[place] office/Mishell Donalson re: asthma  maint on breztri  and dupixent  2nd dose May 27th  started Dupixent    Since last ov multiple teeth pulled > nasal congestion improved but still having sinus ha (points to top of nose at forehead intersection) but no purulent secretions  Chief Complaint  Patient presents with   Follow-up    Yellow mucus   Asthma   Dyspnea:  washes machines night shift ok  Cough: still coughing a lot  Sleeping: flat bed/ props on 3 pillows due to cough    resp cc  SABA use: not using  02: none  Rec  Doxy x 10 days take before eating with large glass of water  If not better call for sinus CT  Please schedule a follow up visit in 3 months but call sooner if needed  with all respiratory medications /inhalers/ solutions in hand    01/05/2024  f/u ov/[place] office/Stefana Lodico re: asthma maint on dexilant / breztri  /ppi/h2 / singulair  and no prednisone  at all / no saba at all since last ov  Chief Complaint  Patient presents with   Asthma    Pt is doing a lot better   Dyspnea:  Not limited by breathing from desired activities   Cough: none  Sleeping: bed is flat with 2-4 pillows s  resp cc  SABA use: none  - neither hfa nor neb  02: none   No obvious day to day or daytime variability or assoc excess/ purulent sputum or mucus plugs or  hemoptysis or cp or chest tightness, subjective wheeze or overt sinus or hb symptoms.    Also denies any obvious fluctuation of symptoms with weather or environmental changes or other aggravating or alleviating factors except as outlined above   No unusual exposure hx or h/o childhood pna/ asthma or knowledge of premature birth.  Current Allergies, Complete Past Medical History, Past Surgical History, Family History, and Social History were reviewed in [employer] record.  ROS  The following are not active complaints unless bolded Hoarseness, sore throat, dysphagia, dental problems, itching, sneezing,  nasal congestion or discharge of excess mucus or purulent secretions, ear ache,   fever, chills, sweats, unintended wt loss or wt gain, classically pleuritic or exertional cp,  orthopnea pnd or arm/hand swelling  or leg swelling, presyncope, palpitations, abdominal pain, anorexia, nausea, vomiting, diarrhea  or change in bowel habits or change in bladder habits, change in stools or change in urine, dysuria, hematuria,  rash, arthralgias, visual complaints, headache, numbness, weakness or ataxia or problems with walking or coordination,  change in mood or  memory.        Current Meds  Medication Sig   acetaminophen  (TYLENOL ) 500 MG tablet Take 1,000 mg by mouth every 6 (six) hours as needed for moderate pain.   albuterol  (VENTOLIN  HFA) 108 (90 Base) MCG/ACT inhaler Inhale 2 puffs into the lungs every 6 (six) hours as needed for wheezing or shortness of breath.   amLODipine  (NORVASC ) 5 MG tablet Take 1 tablet (5 mg total) by mouth daily.   Budeson-Glycopyrrol-Formoterol  (BREZTRI  AEROSPHERE) 160-9-4.8 MCG/ACT AERO Inhale 2 puffs into the lungs  2 (two) times daily. Rinse mouth   DUPIXENT  300 MG/2ML SOAJ Inject into the skin.   EPINEPHrine  0.3 mg/0.3 mL IJ SOAJ injection Inject into thigh for severe allergic reaction   famotidine  (PEPCID ) 20 MG tablet One after supper    montelukast  (SINGULAIR ) 10 MG tablet Take 1 tablet (10 mg total) by mouth at bedtime.   pantoprazole  (PROTONIX ) 40 MG tablet TAKE 1 TABLET (40 MG TOTAL) BY MOUTH DAILY. TAKE 30-60 MIN BEFORE FIRST MEAL OF THE DAY   predniSONE  (DELTASONE ) 10 MG tablet TAKE 2 TABLETS BY MOUTH IN THE MORNING UNTIL BETTER, THEN 1 TABLET FOR 5 DAYS THEN STOP   primidone  (MYSOLINE ) 50 MG tablet TAKE 4 TABLETS TWICE A DAY   promethazine -dextromethorphan  (PROMETHAZINE -DM) 6.25-15 MG/5ML syrup Take 5 mLs by mouth 4 (four) times daily as needed for cough. Do not take with alcohol or while driving or operating heavy machinery.  May cause drowsiness.           Past Medical History:  Diagnosis Date   Acute conjunctivitis of right eye 10/07/2014   Acute respiratory failure with hypoxia (HCC) 07/23/2013   Asthma    Asthma    Phreesia 06/26/2020   Asthma, severe persistent 07/16/2010   PFT >> spiro 2012 with FEV1 1.14 (28%), ratio 45 Alpha 1 (08/12/10) >>MM ([facility] )  IGE 1747, positive allergy  profile >> Elevated eos on CBC S/p intubation 10/2010 Job exposure to respiratory irritant - bleach, chicken Allergy  vaccine begun 11/09/2010. DC'd by pt 2013- resumed 2013> 1:50 GH; DC'd [facility] 9/24-9/27/ 2013- Status asthma, no vent. [facility]- status ast   COVID-19 virus infection 06/17/2020   January, 2022. 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   SVT (supraventricular tachycardia) (HCC) 09/24/2014      Objective:    Wts 01/05/2024        189  10/13/2023          185  09/01/2023        176   07/28/2023        177   07/21/2023       170  07/11/23 175 lb (79.4 kg)  06/23/23 175  lb (79.4 kg)  06/04/23 186 lb 15.2 oz (84.8 kg)    Vital signs reviewed  01/05/2024  - Note at rest 02 sats  97% on RA   General appearance:    amb bmn nad     HEENT : Oropharynx  clear, bad teeth out   Nasal turbinates nl    NECK :  without  apparent JVD/ palpable Nodes/TM    LUNGS: no acc muscle use,  Min barrel  contour chest wall with bilateral  slightly decreased bs s audible wheeze and  without cough on insp or exp maneuvers and min  Hyperresonant  to  percussion bilaterally    CV:  RRR  no s3 or murmur or increase in P2, and no edema   ABD:  soft and nontender   MS:  Nl gait/ ext warm without deformities Or obvious joint restrictions  calf tenderness, cyanosis or clubbing     SKIN: warm and dry without lesions  NEURO:  alert, approp, nl sensorium with  no motor or cerebellar deficits apparent.         Assessment   Assessment & Plan Severe persistent asthma without complication Stopped smoking  2011 (Truncated 01/05/2024 )  spiro 2012 with FEV1 1.14 (28%), ratio 45 Alpha 1 (08/12/10) >>MM ([facility] )  IGE 1747, positive allergy  profile >> Elevated eos on CBC S/p intubation 10/2010 Job exposure to respiratory irritant - bleach, chicken Allergy  vaccine begun 11/09/2010. DC'd by pt 2013- resumed 2013> 1:50 GH; DC'd -[facility] 9/24-9/27/ 2013- Status asthma, no vent. [facility]- status asthma CT 07/11/23 scattered centrilobular emphysema Nucala  therapy begun 2016- dc'd [facility] - Trial of theoph 11/19/13 - 12/10/2013 d/c and rx max gerd rx  Office spirometry 10/09/14- Severe obstructive disease- FVC 2.63/ 52%, FEV1 0.97/ 24%, FEV1/FVC 0.37, FEF25-75% 0.36/ 8% Eosinophils 14% Allergy  profile done 07/17/2014 showed very high total IgE 3283 FENO 03/18/16- 100    Office Spirometry 01/25/17-severe obstructive airways disease. FVC 3.52/68%, FEV1 1.80/43%, ratio or 0.51, FEF 25-75% 0.76/18% Dupixent  stopped p 4 m in July 2024 and worse since off it insurance  would only cover half the year  - 07/28/2023  breztri   rx - FENO   07/28/2023    25 on breztr 2bid and prednisone  20 mg daily  - 09/01/2023 continue breztri    - Dupixent  restarted mid May 2025  - 01/05/2024  After extensive coaching inhaler device,  effectiveness =    90% > continue breztri    All goals of chronic asthma control met including optimal function and elimination of symptoms with minimal need for rescue therapy.  Contingencies discussed in full including contacting this office immediately if not controlling the symptoms using the rule of two's.     Ok to try taper gerd rx =  pepcid  20 mg bid and off PPI but use same ABCD plan  F/u q 3 m, sooner prn    Each maintenance medication was reviewed in detail including emphasizing most importantly the difference between maintenance and prns and under what circumstances the prns are to be triggered using an action plan format where appropriate.  Total time for H and P, chart review, counseling, reviewing hfa device(s) and generating customized AVS unique to this office visit / same day charting = 20 min          AVS  Patient Instructions  Try off pantoprazole  and just take  pepcid  20 mg after first and last meals   - if asthma acts up be sure add back the pantoprazole  before your first meal    Please schedule a follow up visit in 3 months but call sooner if needed    Ozell America, MD 01/05/2024

## 2024-01-05 ENCOUNTER — Ambulatory Visit (INDEPENDENT_AMBULATORY_CARE_PROVIDER_SITE_OTHER): Admitting: Internal Medicine

## 2024-01-05 ENCOUNTER — Encounter: Payer: Self-pay | Admitting: Internal Medicine

## 2024-01-05 VITALS — BP 156/95 | HR 70 | Ht 75.0 in | Wt 189.2 lb

## 2024-01-05 DIAGNOSIS — J4550 Severe persistent asthma, uncomplicated: Secondary | ICD-10-CM

## 2024-01-05 DIAGNOSIS — J320 Chronic maxillary sinusitis: Secondary | ICD-10-CM

## 2024-01-05 NOTE — Patient Instructions (Addendum)
 Try off pantoprazole  and just take  pepcid  20 mg after first and last meals   - if asthma acts up be sure add back the pantoprazole  before your first meal    Please schedule a follow up visit in 3 months but call sooner if needed

## 2024-01-05 NOTE — Assessment & Plan Note (Addendum)
 Stopped smoking  2011 (Truncated 01/05/2024 )  spiro 2012 with FEV1 1.14 (28%), ratio 45 Alpha 1 (08/12/10) >>MM ([employer] [facility] )  IGE 1747, positive allergy  profile >> Elevated eos on CBC S/p intubation 10/2010 Job exposure to respiratory irritant - bleach, chicken Allergy  vaccine begun 11/09/2010. DC'd by pt 2013- resumed 2013> 1:50 GH; DC'd -[facility] 9/24-9/27/ 2013- Status asthma, no vent. [facility]- status asthma CT 07/11/23 scattered centrilobular emphysema Nucala  therapy begun 2016- dc'd [facility] - Trial of theoph 11/19/13 - 12/10/2013 d/c and rx max gerd rx  Office spirometry 10/09/14- Severe obstructive disease- FVC 2.63/ 52%, FEV1 0.97/ 24%, FEV1/FVC 0.37, FEF25-75% 0.36/ 8% Eosinophils 14% Allergy  profile done 07/17/2014 showed very high total IgE 3283 FENO 03/18/16- 100    Office Spirometry 01/25/17-severe obstructive airways disease. FVC 3.52/68%, FEV1 1.80/43%, ratio or 0.51, FEF 25-75% 0.76/18% Dupixent  stopped p 4 m in July 2024 and worse since off it insurance would only cover half the year  - 07/28/2023  breztri   rx - FENO   07/28/2023    25 on breztr 2bid and prednisone  20 mg daily  - 09/01/2023 continue breztri    - Dupixent  restarted mid May 2025  - 01/05/2024  After extensive coaching inhaler device,  effectiveness =    90% > continue breztri    All goals of chronic asthma control met including optimal function and elimination of symptoms with minimal need for rescue therapy.  Contingencies discussed in full including contacting this office immediately if not controlling the symptoms using the rule of two's.     Ok to try taper gerd rx =  pepcid  20 mg bid and off PPI but use same ABCD plan  F/u q 3 m, sooner prn    Each maintenance medication was reviewed in detail including emphasizing most importantly the difference between maintenance and prns and under what circumstances the prns are to be triggered using an action plan format where  appropriate.  Total time for H and P, chart review, counseling, reviewing hfa device(s) and generating customized AVS unique to this office visit / same day charting = 20 min

## 2024-01-05 NOTE — Telephone Encounter (Signed)
 Called [employer] for update on patient assistance application. Patient received last shipment on 12/19/2023 . First shipment on 09/19/2023. He is filling through [facility]. Closing f/u  Phone #: [phone] Fax #: [phone]  Sherry Pennant, PharmD, MPH, BCPS, CPP Clinical Pharmacist (Rheumatology and Pulmonology)

## 2024-01-12 ENCOUNTER — Ambulatory Visit: Payer: Self-pay

## 2024-01-13 ENCOUNTER — Emergency Department (HOSPITAL_COMMUNITY)

## 2024-01-13 ENCOUNTER — Other Ambulatory Visit: Payer: Self-pay

## 2024-01-13 ENCOUNTER — Emergency Department (HOSPITAL_COMMUNITY)
Admission: EM | Admit: 2024-01-13 | Discharge: 2024-01-13 | Disposition: A | Discharge status: Home / Self Care | Attending: Emergency Medicine | Admitting: Emergency Medicine

## 2024-01-13 ENCOUNTER — Encounter (HOSPITAL_COMMUNITY): Payer: Self-pay

## 2024-01-13 DIAGNOSIS — K111 Hypertrophy of salivary gland: Secondary | ICD-10-CM | POA: Diagnosis not present

## 2024-01-13 DIAGNOSIS — R220 Localized swelling, mass and lump, head: Secondary | ICD-10-CM | POA: Diagnosis not present

## 2024-01-13 DIAGNOSIS — K1120 Sialoadenitis, unspecified: Secondary | ICD-10-CM | POA: Insufficient documentation

## 2024-01-13 DIAGNOSIS — K1121 Acute sialoadenitis: Secondary | ICD-10-CM

## 2024-01-13 DIAGNOSIS — K118 Other diseases of salivary glands: Secondary | ICD-10-CM | POA: Diagnosis not present

## 2024-01-13 DIAGNOSIS — E042 Nontoxic multinodular goiter: Secondary | ICD-10-CM | POA: Diagnosis not present

## 2024-01-13 LAB — CBC WITH DIFFERENTIAL/PLATELET
Abs Immature Granulocytes: 0.02 K/uL (ref 0.00–0.07)
Basophils Absolute: 0.1 K/uL (ref 0.0–0.1)
Basophils Relative: 1 %
Eosinophils Absolute: 2.7 K/uL — ABNORMAL HIGH (ref 0.0–0.5)
Eosinophils Relative: 33 %
HCT: 42.9 % (ref 39.0–52.0)
Hemoglobin: 14 g/dL (ref 13.0–17.0)
Immature Granulocytes: 0 %
Lymphocytes Relative: 10 %
Lymphs Abs: 0.8 K/uL (ref 0.7–4.0)
MCH: 31.3 pg (ref 26.0–34.0)
MCHC: 32.6 g/dL (ref 30.0–36.0)
MCV: 96 fL (ref 80.0–100.0)
Monocytes Absolute: 0.2 K/uL (ref 0.1–1.0)
Monocytes Relative: 3 %
Neutro Abs: 4.2 K/uL (ref 1.7–7.7)
Neutrophils Relative %: 53 %
Platelets: 257 K/uL (ref 150–400)
RBC: 4.47 MIL/uL (ref 4.22–5.81)
RDW: 14.3 % (ref 11.5–15.5)
Smear Review: NORMAL
WBC: 8.1 K/uL (ref 4.0–10.5)
nRBC: 0 % (ref 0.0–0.2)

## 2024-01-13 LAB — COMPREHENSIVE METABOLIC PANEL WITH GFR
ALT: 16 U/L (ref 0–44)
AST: 19 U/L (ref 15–41)
Albumin: 3.8 g/dL (ref 3.5–5.0)
Alkaline Phosphatase: 68 U/L (ref 38–126)
Anion gap: 13 (ref 5–15)
BUN: 14 mg/dL (ref 6–20)
CO2: 21 mmol/L — ABNORMAL LOW (ref 22–32)
Calcium: 8.9 mg/dL (ref 8.9–10.3)
Chloride: 104 mmol/L (ref 98–111)
Creatinine, Ser: 0.9 mg/dL (ref 0.61–1.24)
GFR, Estimated: >60 mL/min (ref >60)
Glucose, Bld: 93 mg/dL (ref 70–99)
Potassium: 3.7 mmol/L (ref 3.5–5.1)
Sodium: 138 mmol/L (ref 135–145)
Total Bilirubin: 0.5 mg/dL (ref 0.0–1.2)
Total Protein: 8.6 g/dL — ABNORMAL HIGH (ref 6.5–8.1)

## 2024-01-13 MED ORDER — ONDANSETRON HCL 4 MG/2ML IJ SOLN
4.0000 mg | Freq: Once | INTRAMUSCULAR | Status: AC
  Administered 2024-01-13: 4 mg via INTRAVENOUS
  Filled 2024-01-13: qty 2

## 2024-01-13 MED ORDER — KETOROLAC TROMETHAMINE 15 MG/ML IJ SOLN
15.0000 mg | Freq: Once | INTRAMUSCULAR | Status: AC
  Administered 2024-01-13: 15 mg via INTRAVENOUS
  Filled 2024-01-13: qty 1

## 2024-01-13 MED ORDER — OXYCODONE HCL 5 MG PO TABS: 5.0000 mg | ORAL_TABLET | Freq: Four times a day (QID) | ORAL | 0 refills | Status: DC | PRN

## 2024-01-13 MED ORDER — CLINDAMYCIN HCL 300 MG PO CAPS
300.0000 mg | ORAL_CAPSULE | Freq: Four times a day (QID) | ORAL | 0 refills | Status: AC
Start: 2024-01-13 — End: 2024-01-23

## 2024-01-13 MED ORDER — IOHEXOL 300 MG/ML  SOLN
75.0000 mL | Freq: Once | INTRAMUSCULAR | Status: AC | PRN
  Administered 2024-01-13: 75 mL via INTRAVENOUS

## 2024-01-13 MED ORDER — MORPHINE SULFATE (PF) 4 MG/ML IV SOLN
4.0000 mg | Freq: Once | INTRAVENOUS | Status: AC
  Administered 2024-01-13: 4 mg via INTRAVENOUS
  Filled 2024-01-13: qty 1

## 2024-01-13 MED ORDER — NAPROXEN 500 MG PO TABS: 500.0000 mg | ORAL_TABLET | Freq: Two times a day (BID) | ORAL | 0 refills | Status: DC

## 2024-01-13 NOTE — ED Provider Notes (Signed)
 [place] [facility] AT [facility] Provider Note   CSN: [id number] Arrival date & time: 01/13/24  9266     Patient presents with: Abscess   Drew Sandoval is a 48 y.o. male.   Patient is a 48 year old male who presents emergency department the chief complaint of swelling just inferior to his left ear which has been ongoing for about the past week.  Patient notes that this has occurred before but has went down on its own.  He denies any associated dental pain.  He does admit to associated pain to the left ear.  He denies any associated fever, chills, chest pain, shortness of breath.   Abscess      Prior to Admission medications   Medication Sig Start Date End Date Taking? Authorizing Provider  acetaminophen  (TYLENOL ) 500 MG tablet Take 1,000 mg by mouth every 6 (six) hours as needed for moderate pain.    [provider]  albuterol  (VENTOLIN  HFA) 108 (90 Base) MCG/ACT inhaler Inhale 2 puffs into the lungs every 6 (six) hours as needed for wheezing or shortness of breath. 11/17/23   Darlean Ozell NOVAK, MD  amLODipine  (NORVASC ) 5 MG tablet Take 1 tablet (5 mg total) by mouth daily. 11/16/23   Waddell Danelle ORN, MD  Budeson-Glycopyrrol-Formoterol  (BREZTRI  AEROSPHERE) 160-9-4.8 MCG/ACT AERO Inhale 2 puffs into the lungs 2 (two) times daily. Rinse mouth 12/24/22   Hope Almarie ORN, NP  doxycycline  (VIBRA -TABS) 100 MG tablet Take 1 tablet (100 mg total) by mouth 2 (two) times daily. Take before meals with water Patient not taking: Reported on 01/05/2024 10/13/23   Darlean Ozell NOVAK, MD  DUPIXENT  300 MG/2ML SOAJ Inject into the skin. 09/02/23   [provider]  EPINEPHrine  0.3 mg/0.3 mL IJ SOAJ injection Inject into thigh for severe allergic reaction 09/28/22   Neysa Reggy BIRCH, MD  famotidine  (PEPCID ) 20 MG tablet One after supper 06/23/23   Darlean Ozell NOVAK, MD  montelukast  (SINGULAIR ) 10 MG tablet Take 1 tablet (10 mg total) by mouth at bedtime. 12/24/22   Hope Almarie ORN, NP  pantoprazole  (PROTONIX ) 40 MG tablet TAKE 1 TABLET (40 MG TOTAL) BY MOUTH DAILY. TAKE 30-60 MIN BEFORE FIRST MEAL OF THE DAY 09/22/23   Darlean Ozell NOVAK, MD  predniSONE  (DELTASONE ) 10 MG tablet TAKE 2 TABLETS BY MOUTH IN THE MORNING UNTIL BETTER, THEN 1 TABLET FOR 5 DAYS THEN STOP 11/29/23   Darlean Ozell NOVAK, MD  primidone  (MYSOLINE ) 50 MG tablet TAKE 4 TABLETS TWICE A DAY 08/23/23   Tat, Asberry RAMAN, DO  promethazine -dextromethorphan  (PROMETHAZINE -DM) 6.25-15 MG/5ML syrup Take 5 mLs by mouth 4 (four) times daily as needed for cough. Do not take with alcohol or while driving or operating heavy machinery.  May cause drowsiness. 08/23/23   Chandra Harlene LABOR, NP    Allergies: Azithromycin , Banana, Solu-medrol  Theophilus ], Omnicef  [cefdinir ], Alvesco  [ciclesonide ], Anoro ellipta  [umeclidinium-vilanterol], and Penicillins    Review of Systems  HENT:         Swelling inferior to left ear  All other systems reviewed and are negative.   Updated Vital Signs BP (!) 134/92   Pulse 74   Temp 98 F (36.7 C)   Resp 18   Ht 6' 3 (1.905 m)   Wt 85 kg   SpO2 94%   BMI 23.42 kg/m   Physical Exam Vitals and nursing note reviewed.  Constitutional:      Appearance: Normal appearance.  HENT:     Head: Normocephalic and atraumatic.  Ears:     Comments: Area of swelling just inferior to the left ear, no mastoid tenderness, left TM with overlying erythema, external canal with mild overlying erythema, no purulent discharge, external ear with no overlying erythema, warmth or edema    Nose: Nose normal. No congestion or rhinorrhea.     Mouth/Throat:     Mouth: Mucous membranes are moist.     Pharynx: No oropharyngeal exudate or posterior oropharyngeal erythema.  Eyes:     Extraocular Movements: Extraocular movements intact.     Conjunctiva/sclera: Conjunctivae normal.     Pupils: Pupils are equal, round, and reactive to light.  Cardiovascular:     Rate and Rhythm: Normal rate  and regular rhythm.     Pulses: Normal pulses.     Heart sounds: Normal heart sounds. No murmur heard.    No gallop.  Pulmonary:     Effort: Pulmonary effort is normal. No respiratory distress.     Breath sounds: Normal breath sounds. No wheezing or rales.  Musculoskeletal:        General: Normal range of motion.     Cervical back: Normal range of motion and neck supple. No rigidity or tenderness.  Skin:    General: Skin is warm and dry.  Neurological:     General: No focal deficit present.     Mental Status: He is alert and oriented to person, place, and time. Mental status is at baseline.  Psychiatric:        Mood and Affect: Mood normal.        Behavior: Behavior normal.        Thought Content: Thought content normal.        Judgment: Judgment normal.     (all labs ordered are listed, but only abnormal results are displayed) Labs Reviewed  COMPREHENSIVE METABOLIC PANEL WITH GFR  CBC WITH DIFFERENTIAL/PLATELET    EKG: None  Radiology: No results found.   Procedures   Medications Ordered in the ED  morphine  (PF) 4 MG/ML injection 4 mg (4 mg Intravenous Given 01/13/24 0941)  ondansetron  (ZOFRAN ) injection 4 mg (4 mg Intravenous Given 01/13/24 0942)  ketorolac  (TORADOL ) 15 MG/ML injection 15 mg (15 mg Intravenous Given 01/13/24 0942)                                    Medical Decision Making Amount and/or Complexity of Data Reviewed Labs: ordered. Radiology: ordered.  Risk Prescription drug management.   This patient presents to the ED for concern of swelling to the left side of neck differential diagnosis includes abscess, cellulitis, otitis media, otitis externa, mastoiditis, sialoadenitis, sialolithiasis    Additional history obtained:  Additional history obtained from medical records External records from outside source obtained and reviewed including medical records   Lab Tests:  I Ordered, and personally interpreted labs.  The pertinent results  include: No leukocytosis, no anemia, normal kidney function liver function, normal electrolytes   Imaging Studies ordered:  I ordered imaging studies including CT scan neck I independently visualized and interpreted imaging which showed swelling and stranding around left parotid gland, 11 mm stone I agree with the radiologist interpretation   Medicines ordered and prescription drug management:  I ordered medication including morphine , Toradol , Zofran  for pain Reevaluation of the patient after these medicines showed that the patient improved I have reviewed the patients home medicines and have made adjustments as needed   Problem  List / ED Course:  Patient is doing well at this time and is stable for discharge home.  Discussed with patient imaging is consistent with parotitis at this point with an 11 mm stone.  Discussed the need for use of sialagogues at home and will place on antibiotics.  He has no signs of any acute airway compromise at this point.  There is no indication for Ludwig's angina or acute peritonsillar abscess.  Vital signs are stable with no indication for sepsis.  Blood work is otherwise unremarkable.  Discussed with patient with the importance of close follow-up with ENT on an outpatient basis for continued evaluation.  Patient was made aware of the thyroid  nodules which he notes that he is already aware of as well.  Strict turn precautions were discussed for any new or worsening symptoms.  Patient voiced understanding and had no additional questions.   Social Determinants of Health:  None        Final diagnoses:  None    ED Discharge Orders     None          Daralene Lonni JONETTA DEVONNA 01/13/24 1228    Suzette Pac, MD 01/14/24 1730

## 2024-01-13 NOTE — Discharge Instructions (Addendum)
 Please follow-up closely with ENT on an outpatient basis for reevaluation.  Return to emergency department immediately for any new or worsening symptoms.  Please take all antibiotics as directed.  Providers Accepting New Patients in [place], [place]    [facility] [address]  [place], [place] [address] [phone] Accepts most insurances  [facility] [address] [place], [place] [address] [phone] Accepts most insurances  [facility] [address] [place], [place] [address]  [phone] Must meet requirements  [facility] [address] [place], [place] [address] [phone] Accepts most insurances  [facility] [address]  [place], [place] [address] [phone] Accepts most insurances  [facility] [address]   [place], [place]   [phone] Accepts most insurances  [facility])  [phone] [address]  [place], [place] [address] [phone] Accepts most insurances     [facility] [address]  [place], [place] [address] [phone] Accepts most insurances  [facility] Department [address] [place], [place] [address] [phone] option 1 Accepts Medicaid and [facility] [address]  [place], [place] [address] [phone] Accepts most insurances  Benita Outhouse, MD [address] [place], [place] [address] [phone] Accepts most insurances  [facility] [address]  [place], [place] [address] [phone] Accepts most insurances  [facility] [phone] [address] [place], [place] [address] [phone] Accepts most insurances  [place] [place] [address] [place], [place] [address] [phone]  Accepts most insurances

## 2024-01-13 NOTE — ED Triage Notes (Signed)
 Pt presenting to ED for abscess under left ear that started last week. Pt stated that this is a recurrent issue but they usually resolve on their own. No fever present.

## 2024-01-16 ENCOUNTER — Other Ambulatory Visit: Payer: Self-pay | Admitting: Primary Care

## 2024-01-17 LAB — PATHOLOGIST SMEAR REVIEW

## 2024-01-24 ENCOUNTER — Encounter: Payer: Self-pay | Admitting: Neurology

## 2024-01-25 ENCOUNTER — Telehealth (INDEPENDENT_AMBULATORY_CARE_PROVIDER_SITE_OTHER): Payer: Self-pay

## 2024-01-25 ENCOUNTER — Ambulatory Visit (INDEPENDENT_AMBULATORY_CARE_PROVIDER_SITE_OTHER): Admitting: Otolaryngology

## 2024-01-25 VITALS — BP 120/74 | HR 79 | Ht 75.0 in | Wt 189.0 lb

## 2024-01-25 DIAGNOSIS — K1120 Sialoadenitis, unspecified: Secondary | ICD-10-CM

## 2024-01-25 DIAGNOSIS — K115 Sialolithiasis: Secondary | ICD-10-CM | POA: Diagnosis not present

## 2024-01-25 MED ORDER — CLINDAMYCIN HCL 300 MG PO CAPS: 300.0000 mg | ORAL_CAPSULE | Freq: Three times a day (TID) | ORAL | 0 refills | Status: AC

## 2024-01-25 MED ORDER — METHYLPREDNISOLONE 4 MG PO TBPK: ORAL_TABLET | ORAL | 0 refills | Status: DC

## 2024-01-25 NOTE — Progress Notes (Signed)
 Otolaryngology Clinic Note HPI:  Drew Sandoval is a 48 y.o. male kindly referred for evaluation of parotitis follow up.   Initial visit (01/2024):  Patient reports that for the past couple of years, he has had recurrent left parotid/cheek swelling, comes on suddenly, mildly tender, but then goes away in a few days. He had recurrence of this a few weeks ago, but did not resolve so went to the ED where he was treated for parotitis and then referred here. He denies skin cancers or lesions. Pain/swelling has improved significantly after abx but continues to feel small knot over left cheek. No prior bx or procedures. No dehydration or recent URI. No DM  PMHx: HTN, Severe Asthma, SVT s/p ablation, Essential tremor, Hyper IgE syndrome  H&N surgery: denies  Personal or FHx of bleeding dz or anesthesia difficulty: no  GLP-1: no AP/AC: no  Tobacco: prior, quit smoking (30 pack year)  Independent Review of Additional Tests or Records:  Labs 01/13/2024: CBC and CMP - WBC 8.1, Eos 2700; BUN/Cr wnl  CT Neck 01/13/2024 independently interpreted: noted parotid enlargement and stranding withut noted abscess, 11mm stone tail of parotid with left parotid duct dilation; noted present even on prior CT ([address]779 but now appears more distal in the duct closer to parenchyma; reactive appearing b/l LAD  PMH/Meds/All/SocHx/FamHx/ROS:   Past Medical History:  Diagnosis Date   Acute conjunctivitis of right eye 10/07/2014   Acute respiratory failure with hypoxia (HCC) 07/23/2013   Asthma    Asthma    Phreesia 06/26/2020   Asthma, severe persistent 07/16/2010   PFT >> spiro 2012 with FEV1 1.14 (28%), ratio 45 Alpha 1 (08/12/10) >>MM ([facility] )  IGE 1747, positive allergy  profile >> Elevated eos on CBC S/p intubation 10/2010 Job exposure to respiratory irritant - bleach, chicken Allergy  vaccine begun 11/09/2010. DC'd by pt 2013- resumed 2013> 1:50 GH; DC'd [facility] 9/24-9/27/ 2013- Status asthma, no  vent. [facility] 3/ 2015- status ast   COVID-19 virus infection 06/17/2020   January, 2022. 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   SVT (supraventricular tachycardia) (HCC) 09/24/2014     Past Surgical History:  Procedure Laterality Date   None     SVT ABLATION N/A 09/07/2019   Procedure: SVT ABLATION;  Surgeon: Waddell Danelle ORN, MD;  Location: [facility] INVASIVE CV LAB;  Service: Cardiovascular;  Laterality: N/A;    Family History  Problem Relation Age of Onset   Lung cancer Mother    Emphysema Father    Arthritis Sister    Diabetes Maternal Grandmother    Thyroid  disease Neg Hx      Social Connections: Not on file      Current Outpatient Medications:    acetaminophen  (TYLENOL ) 500 MG tablet, Take 1,000 mg by mouth every 6 (six) hours as needed for moderate pain., Disp: , Rfl:    albuterol  (VENTOLIN  HFA) 108 (90 Base) MCG/ACT inhaler, Inhale 2 puffs into the lungs every 6 (six) hours as needed for wheezing or shortness of breath., Disp: 8 g, Rfl: 2   amLODipine  (NORVASC ) 5 MG tablet, Take 1 tablet (5 mg total) by mouth daily., Disp: 90  tablet, Rfl: 3   BREZTRI  AEROSPHERE 160-9-4.8 MCG/ACT AERO inhaler, INHALE 2 PUFFS INTO THE LUNGS 2 (TWO) TIMES DAILY. RINSE MOUTH, Disp: 10.7 each, Rfl: 11   clindamycin  (CLEOCIN ) 300 MG capsule, Take 1 capsule (300 mg total) by mouth 3 (three) times daily for 10 days., Disp: 30 capsule, Rfl: 0   DUPIXENT  300 MG/2ML SOAJ, Inject into the skin., Disp: , Rfl:    EPINEPHrine  0.3 mg/0.3 mL IJ SOAJ injection, Inject into thigh for severe allergic reaction, Disp: 1 each, Rfl: prn   famotidine  (PEPCID ) 20 MG tablet, One after supper, Disp: 30  tablet, Rfl: 11   montelukast  (SINGULAIR ) 10 MG tablet, Take 1 tablet (10 mg total) by mouth at bedtime., Disp: 30 tablet, Rfl: 11   oxyCODONE  (ROXICODONE ) 5 MG immediate release tablet, Take 1 tablet (5 mg total) by mouth every 6 (six) hours as needed for severe pain (pain score 7-10)., Disp: 10 tablet, Rfl: 0   pantoprazole  (PROTONIX ) 40 MG tablet, TAKE 1 TABLET (40 MG TOTAL) BY MOUTH DAILY. TAKE 30-60 MIN BEFORE FIRST MEAL OF THE DAY, Disp: 30 tablet, Rfl: 2   primidone  (MYSOLINE ) 50 MG tablet, TAKE 4 TABLETS TWICE A DAY, Disp: 720 tablet, Rfl: 0   doxycycline  (VIBRA -TABS) 100 MG tablet, Take 1 tablet (100 mg total) by mouth 2 (two) times daily. Take before meals with water (Patient not taking: Reported on 01/25/2024), Disp: 20 tablet, Rfl: 0   naproxen  (NAPROSYN ) 500 MG tablet, Take 1 tablet (500 mg total) by mouth 2 (two) times daily. (Patient not taking: Reported on 01/25/2024), Disp: 20 tablet, Rfl: 0   predniSONE  (DELTASONE ) 10 MG tablet, Take 1 tablet (10 mg total) by mouth daily with breakfast for 7 days., Disp: 7 tablet, Rfl: 0   promethazine -dextromethorphan  (PROMETHAZINE -DM) 6.25-15 MG/5ML syrup, Take 5 mLs by mouth 4 (four) times daily as needed for cough. Do not take with alcohol or while driving or operating heavy machinery.  May cause drowsiness. (Patient not taking: Reported on 01/25/2024), Disp: 118 mL, Rfl: 0   Physical Exam:   BP 120/74 (BP Location: Right Arm, Patient Position: Sitting)   Pulse 79   Ht 6' 3 (1.905 m)   Wt 189 lb (85.7 kg)   SpO2 93%   BMI 23.62 kg/m   Salient findings:  CN II-XII intact Bilateral EAC clear and TM intact with well pneumatized middle ear spaces Anterior rhinoscopy: Septum intact; bilateral inferior turbinates without significantly No lesions of oral cavity/oropharynx; easily expressible clear saliva b/l parotids; no skin lesions; small left firm tail of parotid nodule No obviously palpable neck masses/lymphadenopathy/thyromegaly No  respiratory distress or stridor  Seprately Identifiable Procedures:  Prior to initiating any procedures, risks/benefits/alternatives were explained to the patient and verbal consent obtained. None  Impression & Plans:  Zai Chmiel is a 48 y.o. male with:  1. Parotid sialoadenitis   2. 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 Persistent mild symptoms but will extend abx and do short course of steroids. Given location of stone, would likely need parotidectomy to remove stone but will discuss that further once acute exacerbation resolves  - Clindamycin  and prednisone  as below - f/u 6-8 weeks   See below regarding exact medications prescribed this encounter including dosages and route: Meds ordered this encounter  Medications   clindamycin  (CLEOCIN ) 300 MG capsule    Sig: Take 1 capsule (300 mg total) by mouth 3 (three) times daily for 10 days.    Dispense:  30  capsule    Refill:  0   DISCONTD: methylPREDNISolone  (MEDROL  DOSEPAK) 4 MG TBPK tablet    Sig: Take as prescribed on the package.    Dispense:  1 each    Refill:  0      Thank you for allowing me the opportunity to care for your patient. Please do not hesitate to contact me should you have any other questions.  Sincerely, Eldora Blanch, MD Otolaryngologist (ENT), [facility] Phone: [phone] Fax: [phone]  01/29/2024, 1:21 PM   MDM:  Level 4 - 99204 Complexity/Problems addressed: mod - chronic problem with exacerbation Data complexity: mod - independent interpretation of imaging - Morbidity: mod  - Prescription Drug prescribed or managed: y

## 2024-01-25 NOTE — Patient Instructions (Addendum)
 Take clindamycin  300mg  tablet 3 times per day for 10 days Take steroids as prescribed on the package Massage the saliva gland, eat sour candy and drink lots of water and can put a warm compress multiple times per day.

## 2024-01-26 MED ORDER — PREDNISONE 10 MG PO TABS: 10.0000 mg | ORAL_TABLET | Freq: Every day | ORAL | 0 refills | Status: AC

## 2024-01-26 NOTE — Telephone Encounter (Signed)
 LVM informing patient about r/x.

## 2024-01-26 NOTE — Telephone Encounter (Signed)
 Changed to pred 10mg  x7d Drew Sandoval

## 2024-01-29 ENCOUNTER — Encounter (INDEPENDENT_AMBULATORY_CARE_PROVIDER_SITE_OTHER): Payer: Self-pay | Admitting: Otolaryngology

## 2024-02-03 ENCOUNTER — Other Ambulatory Visit: Payer: Self-pay | Admitting: Internal Medicine

## 2024-02-12 ENCOUNTER — Other Ambulatory Visit: Payer: Self-pay | Admitting: Primary Care

## 2024-02-15 ENCOUNTER — Institutional Professional Consult (permissible substitution) (INDEPENDENT_AMBULATORY_CARE_PROVIDER_SITE_OTHER)

## 2024-02-16 ENCOUNTER — Other Ambulatory Visit: Payer: Self-pay | Admitting: Internal Medicine

## 2024-02-16 NOTE — Telephone Encounter (Signed)
 Copied from CRM [id number]. Topic: Clinical - Medication Refill >> Feb 16, 2024  9:41 AM Ismael A wrote: Medication: montelukast  (SINGULAIR ) 10 MG tablet  Has the patient contacted their pharmacy? Yes (Agent: If no, request that the patient contact the pharmacy for the refill. If patient does not wish to contact the pharmacy document the reason why and proceed with request.) (Agent: If yes, when and what did the pharmacy advise?)  This is the patient's preferred pharmacy:  [id number] - [place], [place] - [address] AT [facility] [address] [place] [address] Phone: [phone] Fax: [phone]   Is this the correct pharmacy for this prescription? Yes If no, delete pharmacy and type the correct one.   Has the prescription been filled recently? No  Is the patient out of the medication? Yes  Has the patient been seen for an appointment in the last year OR does the patient have an upcoming appointment? Yes  Can we respond through MyChart? Yes  Agent: Please be advised that Rx refills may take up to 3 business days. We ask that you follow-up with your pharmacy.

## 2024-02-18 ENCOUNTER — Other Ambulatory Visit: Payer: Self-pay | Admitting: Neurology

## 2024-02-18 DIAGNOSIS — G250 Essential tremor: Secondary | ICD-10-CM

## 2024-02-20 MED ORDER — MONTELUKAST SODIUM 10 MG PO TABS: 10.0000 mg | ORAL_TABLET | Freq: Every day | ORAL | 10 refills | Status: AC

## 2024-02-23 ENCOUNTER — Ambulatory Visit: Payer: Self-pay

## 2024-03-22 ENCOUNTER — Encounter (INDEPENDENT_AMBULATORY_CARE_PROVIDER_SITE_OTHER): Payer: Self-pay | Admitting: Otolaryngology

## 2024-03-22 ENCOUNTER — Ambulatory Visit (INDEPENDENT_AMBULATORY_CARE_PROVIDER_SITE_OTHER): Admitting: Otolaryngology

## 2024-03-22 VITALS — BP 146/81 | HR 79 | Ht 75.0 in | Wt 189.0 lb

## 2024-03-22 DIAGNOSIS — K115 Sialolithiasis: Secondary | ICD-10-CM

## 2024-03-22 DIAGNOSIS — K1120 Sialoadenitis, unspecified: Secondary | ICD-10-CM

## 2024-03-22 NOTE — Progress Notes (Signed)
 Otolaryngology Clinic Note HPI:  Drew Sandoval is a 48 y.o. male kindly referred for evaluation of parotitis follow up.   Initial visit (01/2024):  Patient reports that for the past couple of years, he has had recurrent left parotid/cheek swelling, comes on suddenly, mildly tender, but then goes away in a few days. He had recurrence of this a few weeks ago, but did not resolve so went to the ED where he was treated for parotitis and then referred here. He denies skin cancers or lesions. Pain/swelling has improved significantly after abx but continues to feel small knot over left cheek. No prior bx or procedures. No dehydration or recent URI. No DM  --------------------------------------------------------- 03/22/2024 Noted continued recurrent episodes of left parotid/cheek swelling, which is tender. He reports he has gotten antibiotics for this since last visit. Doing well currently.    PMHx: HTN, Severe Asthma, SVT s/p ablation, Essential tremor, Hyper IgE syndrome  H&N surgery: denies  Personal or FHx of bleeding dz or anesthesia difficulty: no  GLP-1: no AP/AC: no  Tobacco: prior, quit smoking (30 pack year)  Independent Review of Additional Tests or Records:  Labs 01/13/2024: CBC and CMP - WBC 8.1, Eos 2700; BUN/Cr wnl  CT Neck 01/13/2024 independently interpreted: noted parotid enlargement and stranding withut noted abscess, 11mm stone tail of parotid with left parotid duct dilation; noted present even on prior CT (79779 but now appears more distal in the duct closer to parenchyma; reactive appearing b/l LAD  PMH/Meds/All/SocHx/FamHx/ROS:   Past Medical History:  Diagnosis Date   Acute conjunctivitis of right eye 10/07/2014   Acute respiratory failure with hypoxia (HCC) 07/23/2013   Asthma    Asthma    Phreesia 06/26/2020   Asthma, severe persistent (HCC) 07/16/2010   PFT >> spiro 2012 with FEV1 1.14 (28%), ratio 45 Alpha 1 (08/12/10) >>MM ([facility] )  IGE 1747,  positive allergy  profile >> Elevated eos on CBC S/p intubation 10/2010 Job exposure to respiratory irritant - bleach, chicken Allergy  vaccine begun 11/09/2010. DC'd by pt 2013- resumed 2013> 1:50 GH; DC'd [facility] 9/24-9/27/ 2013- Status asthma, no vent. [facility]- status ast   COVID-19 virus infection 06/17/2020   January, 2022. No hosp, no MAB   Eczema, allergic 07/22/2011   Essential tremor 06/27/2020   Hypertension    Seasonal allergies    Seasonal and perennial allergic rhinitis 07/17/2014   FENO 03/18/16- 100  High, indicates allergic asthma   Sinusitis, maxillary, 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   Sinus CT 12/10/2013 >>>    Status asthmaticus 10/19/2010   Sustained SVT 09/24/2014   SVT (supraventricular tachycardia) 09/24/2014     Past Surgical History:  Procedure Laterality Date   None     SVT ABLATION N/A 09/07/2019   Procedure: SVT ABLATION;  Surgeon: Waddell Danelle ORN, MD;  Location: [facility];  Service: Cardiovascular;  Laterality: N/A;    Family History  Problem Relation Age of Onset   Lung cancer Mother    Emphysema Father    Arthritis Sister    Diabetes Maternal Grandmother    Thyroid  disease Neg Hx      Social Connections: Not on file      Current Outpatient Medications:    acetaminophen  (TYLENOL ) 500 MG tablet, Take 1,000 mg by mouth every 6 (six) hours as needed for moderate pain., Disp: , Rfl:    albuterol  (VENTOLIN  HFA) 108 (90 Base) MCG/ACT inhaler, TAKE 2 PUFFS BY MOUTH EVERY 6 HOURS AS NEEDED FOR WHEEZE  OR SHORTNESS OF BREATH, Disp: 8.5 each, Rfl: 2   amLODipine  (NORVASC ) 5 MG tablet, Take 1 tablet (5 mg total) by mouth daily., Disp: 90 tablet, Rfl: 3   BREZTRI  AEROSPHERE 160-9-4.8 MCG/ACT AERO inhaler, INHALE 2 PUFFS INTO THE LUNGS 2 (TWO) TIMES DAILY. RINSE MOUTH, Disp: 10.7 each, Rfl: 11   DUPIXENT  300 MG/2ML SOAJ, Inject into the skin., Disp: , Rfl:    EPINEPHrine  0.3 mg/0.3 mL IJ SOAJ injection, Inject into thigh for severe allergic  reaction, Disp: 1 each, Rfl: prn   famotidine  (PEPCID ) 20 MG tablet, One after supper, Disp: 30 tablet, Rfl: 11   montelukast  (SINGULAIR ) 10 MG tablet, Take 1 tablet (10 mg total) by mouth at bedtime., Disp: 30 tablet, Rfl: 10   oxyCODONE  (ROXICODONE ) 5 MG immediate release tablet, Take 1 tablet (5 mg total) by mouth every 6 (six) hours as needed for severe pain (pain score 7-10)., Disp: 10 tablet, Rfl: 0   pantoprazole  (PROTONIX ) 40 MG tablet, TAKE 1 TABLET (40 MG TOTAL) BY MOUTH DAILY. TAKE 30-60 MIN BEFORE FIRST MEAL OF THE DAY, Disp: 30 tablet, Rfl: 2   primidone  (MYSOLINE ) 50 MG tablet, TAKE 4 TABLETS IN THE AM, THEN 4 IN THE PM DAILY, Disp: 720 tablet, Rfl: 0   doxycycline  (VIBRA -TABS) 100 MG tablet, Take 1 tablet (100 mg total) by mouth 2 (two) times daily. Take before meals with water (Patient not taking: Reported on 03/22/2024), Disp: 20 tablet, Rfl: 0   naproxen  (NAPROSYN ) 500 MG tablet, Take 1 tablet (500 mg total) by mouth 2 (two) times daily. (Patient not taking: Reported on 03/22/2024), Disp: 20 tablet, Rfl: 0   promethazine -dextromethorphan  (PROMETHAZINE -DM) 6.25-15 MG/5ML syrup, Take 5 mLs by mouth 4 (four) times daily as needed for cough. Do not take with alcohol or while driving or operating heavy machinery.  May cause drowsiness. (Patient not taking: Reported on 03/22/2024), Disp: 118 mL, Rfl: 0   Physical Exam:   BP (!) 146/81 (BP Location: Right Arm, Patient Position: Sitting, Cuff Size: Large)   Pulse 79   Ht 6' 3 (1.905 m)   Wt 189 lb (85.7 kg)   SpO2 90%   BMI 23.62 kg/m   Salient findings:  CN II-XII intact Bilateral EAC clear and TM intact with well pneumatized middle ear spaces Anterior rhinoscopy: Septum intact; bilateral inferior turbinates without significantly No lesions of oral cavity/oropharynx; easily expressible clear saliva b/l parotids; no skin lesions; small left more inferior firmness - query stone? No obviously palpable neck  masses/lymphadenopathy/thyromegaly No respiratory distress or stridor  Seprately Identifiable Procedures:  Prior to initiating any procedures, risks/benefits/alternatives were explained to the patient and verbal consent obtained. None  Impression & Plans:  Yovani Cogburn is a 48 y.o. male with:  1. Parotid sialolithiasis   2. 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 Persistent symptoms. Given location of stone, would likely need parotidectomy to remove stone and we did discuss options for this but will get repeat CT to see where the stone is. Also discussed sialendoscopy but not sure if he will be a good candidate for this, will discuss with [facility]  F/u 6 weeks with CT   See below regarding exact medications prescribed this encounter including dosages and route: No orders of the defined types were placed in this encounter.     Thank you for allowing me the opportunity to care for your patient. Please do not hesitate to contact me should you have any other questions.  Sincerely, Eldora Blanch, MD Otolaryngologist (ENT), [facility] Phone: [phone] Fax: [phone]  03/22/2024, 8:13 AM   MDM:  I have personally spent 30 minutes involved in face-to-face and non-face-to-face activities for this patient on the day of the visit.  Professional time spent excludes any procedures performed but includes the following activities, in addition to those noted in the documentation: preparing to see the patient (review of outside documentation and results), performing a medically appropriate examination, counseling, ordering tests, documenting in the electronic health record, independently interpreting results

## 2024-03-27 ENCOUNTER — Ambulatory Visit (INDEPENDENT_AMBULATORY_CARE_PROVIDER_SITE_OTHER): Admitting: Otolaryngology

## 2024-03-30 ENCOUNTER — Encounter (HOSPITAL_COMMUNITY): Payer: Self-pay

## 2024-03-30 ENCOUNTER — Ambulatory Visit (HOSPITAL_COMMUNITY)
Admission: RE | Admit: 2024-03-30 | Discharge: 2024-03-30 | Disposition: A | Discharge status: Home / Self Care | Source: Ambulatory Visit | Attending: Otolaryngology | Admitting: Otolaryngology

## 2024-03-30 DIAGNOSIS — K1120 Sialoadenitis, unspecified: Secondary | ICD-10-CM | POA: Diagnosis not present

## 2024-03-30 DIAGNOSIS — K115 Sialolithiasis: Secondary | ICD-10-CM | POA: Diagnosis not present

## 2024-03-30 DIAGNOSIS — E041 Nontoxic single thyroid nodule: Secondary | ICD-10-CM | POA: Diagnosis not present

## 2024-03-30 DIAGNOSIS — J358 Other chronic diseases of tonsils and adenoids: Secondary | ICD-10-CM

## 2024-03-30 MED ORDER — IOHEXOL 300 MG/ML  SOLN
75.0000 mL | Freq: Once | INTRAMUSCULAR | Status: AC | PRN
  Administered 2024-03-30: 75 mL via INTRAVENOUS

## 2024-03-30 MED ORDER — SODIUM CHLORIDE (PF) 0.9 % IJ SOLN
INTRAMUSCULAR | Status: AC
  Filled 2024-03-30: qty 50

## 2024-04-09 ENCOUNTER — Ambulatory Visit: Admitting: Internal Medicine

## 2024-04-09 ENCOUNTER — Encounter: Payer: Self-pay | Admitting: Internal Medicine

## 2024-04-09 VITALS — BP 148/86 | HR 83 | Ht 75.0 in | Wt 192.2 lb

## 2024-04-09 DIAGNOSIS — J4550 Severe persistent asthma, uncomplicated: Secondary | ICD-10-CM

## 2024-04-09 MED ORDER — LEVALBUTEROL HCL 0.63 MG/3ML IN NEBU: 0.6300 mg | INHALATION_SOLUTION | RESPIRATORY_TRACT | Status: AC | PRN

## 2024-04-09 NOTE — Progress Notes (Signed)
 Drew Sandoval, male    DOB: 07/27/1975    MRN: [id number]   Brief patient profile:  47  yowb  quit smoking  2011 at onset of asthma  while working [employer] (p 3rd year working there and continues to do so)  referred to pulmonary clinic in [place]  06/23/2023 by [facility]   for asthma  already maint on dupixent  and ohtuvayre .  Drew Sandoval Pt  transferred to [facility] Sandoval for convenience   Admit date: 06/04/2023 Discharge date: 06/06/2023     Recommendations for Outpatient Follow-up:  Follow up with pulmonology, Drew Sandoval as scheduled in February Continue on Decadron  for 5 days as prescribed Finish course of Levaquin  with 4 more days as prescribed Refills given on albuterol  inhaler to use for shortness of breath or wheezing Lidocaine  patch given for left-sided chest pain Continue other home medications as prior Patient remains high risk for readmission    Brief/Interim Summary: Drew Sandoval is a 48 y.o. male with medical history significant for severe persistent asthma, hyper IgE syndrome, SVT status post ablation, hypertension, and essential tremor who has apparently been struggling with waxing and waning symptoms of shortness of breath along with cough over the last 1.5 months.  He states he has had worsening shortness of breath as well as left-sided chest pain that has worsened over the last several days.  He has been admitted for acute hypoxemic respiratory failure secondary to suspected community-acquired pneumonia with associated asthma exacerbation.  He is overall feeling much better today and is eager for discharge.  He no longer has any oxygen  requirements.  He will follow-up with pulmonology outpatient.     Discharge Diagnoses:  Principal Problem:   Acute hypoxemic respiratory failure (HCC):   Asthma, severe persistent   Allergic eosinophilia   Hyper-IgE syndrome (HCC)   SVT (supraventricular tachycardia) (HCC)   Hypertension, essential   Essential tremor    Thyroid  nodule   Acute asthma exacerbation   Lobar pneumonia (HCC)   Principal discharge diagnosis: Acute hypoxemic respiratory failure secondary to community-acquired pneumonia and acute asthma exacerbation.     History of Present Illness  06/23/2023  Pulmonary/ 1st Sandoval eval/ Drew Sandoval on Breztri  and Ohtuvayre  (one month).  Did not feel dupiexent worked p 4 m so stopped in July 2024 by insurance  Chief Complaint  Patient presents with   Asthma  Dyspnea:  x 100 ft slow pace  Cough: lots of coughing x July 2014 dry sounding worse with exertion and lie down x sev hours  / prednisone  helps a lot > min mucoid production  Sleep: bed is flat with 3 pillows   SABA use: on job uses hfa up 4-5 times in  8 h and at home once at home / neb avg once every other day  02: none Rec Plan A = Automatic = Always=    Breztri  Take 2 puffs first thing in am and then another 2 puffs about 12 hours later.   Work on inhaler technique:   >>>  Remember how golfers warm up by taking practice swings - do this with an empty inhaler  Plan B = Backup (to supplement plan A, not to replace it) Only use your albuterol  inhaler as a rescue medication  Plan C = Crisis (instead of Plan B but only if Plan B stops working) - only use your levoalbuterol nebulizer if you first try Plan B  Prednisone  10 mg  2 each am until better then 1  daily x 5 days and stop  Panntoprazole (protonix ) 40 mg   Take  30-60 min before first meal of the day and Pepcid  (famotidine )  20 mg after supper until return to Sandoval   GERD diet reviewed, bed blocks rec  Please schedule a follow up Sandoval visit in 4 weeks, sooner if needed  with all medications /inhalers/ solutions in hand      07/28/2023  f/u ov/Drew Sandoval re: 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  Dyspnea:  still at work cleaning  Cough: cloudy  Sleeping:  bed is flat/ 3 pillows under s  resp cc  SABA use: much less  02: none  Rec Plan A = Automatic = Always=    Breztri  Take 2 puffs first thing in am and then another 2 puffs about 12 hours later and continue Ohtuvayre  twice daily  Plan B = Backup (to supplement plan A, not to replace it) Only use your albuterol  inhaler as a rescue medication  Plan C = Crisis (instead of Plan B but only if Plan B stops working) - only use your levoalbuterol nebulizer if you first try Plan B  Plan D = Deltasone , when ABC not working for you  Prednisone  10 mg  2 each am until better then 1 daily x 5 days and stop  Pantoprazole  (protonix ) 40 mg   Take  30-60 min before first meal of the day and Pepcid  (famotidine )  20 mg after supper until return to Sandoval  GERD diet reviewed, bed blocks rec       08/23/22 to UC with sev days of cough/ sob/ wheeze/ sneeze and did not activate ABCD plan and told ER doc pulmonary stopped treating his allergies rx 40 mg daily x 5 days    09/01/2023  f/u ov/Drew Sandoval Sandoval/Drew Sandoval re: ACOS  maint on breztri / proair /   Chief Complaint  Patient presents with   Asthma  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  Cough: none  Sleeping: bed flat/ props up on 3 pillows  resp cc  SABA use: none since am 08/31/23  02: none   Rec Plan A = Automatic = Always=    Breztri  Take 2 puffs first thing in am and then another 2 puffs about 12 hours later and continue Ohtuvayre  twice daily  Start back on Dipixent  300 mg every 2 weeks   Plan B = Backup (to supplement plan A, not to replace it) Only use your albuterol  inhaler as a  rescue medication  Plan C = Crisis (instead of Plan B but only if Plan B stops working) - only use your levoalbuterol nebulizer if you first try Plan B and it fails to help > ok to use the nebulizer up to every 4 hours but if start needing it regularly call for immediate appointment Plan D = Deltasone , when ABC not working for you  Prednisone  10 mg  2  each am until better then 1 daily x 5 days and stop  Continue Pantoprazole  (protonix ) 40 mg   Take  30-60 min before first meal of the day and Pepcid  (famotidine )  20 mg after supper   - see dentist   Cxr: 1. Improved, but not resolved, multifocal lower lung pneumonia.          01/05/2024  f/u ov/[place] Sandoval/Dacoda Spallone re: asthma maint on dexilant / breztri  /ppi/h2 / singulair  and no prednisone  at all / no saba at all since last ov  Chief Complaint  Patient presents with   Asthma    Pt is doing a lot better   Dyspnea:  Not limited by breathing from desired activities   Cough: none  Sleeping: bed is flat with 2-4 pillows s  resp cc  SABA use: none  - neither hfa nor neb  02: none  Patient Instructions  Try off pantoprazole  and just take  pepcid  20 mg after first and last meals   - if asthma acts up be sure add back the pantoprazole  before your first meal      04/09/2024  3 m f/u ov/[place] Sandoval/Marlaysia Lenig re: asthma  maint on breztri / dupixent  last took prednisone  x 2 weeks prior to OV  - overall no worse off gerd rx/ not still did not follow ABC plan before starting D  Chief Complaint  Patient presents with   Asthma    F/u  Had a cold and it got bad but doing better now   Dyspnea:  Not limited by breathing from desired activities   Cough: none  Sleeping: flat bed  2 pillow s    resp cc  SABA use: not since Oct 205  02: none   No obvious day to day or daytime variability or assoc excess/ purulent sputum or mucus plugs or hemoptysis or cp or chest tightness, subjective wheeze or overt sinus or hb symptoms.    Also denies any  obvious fluctuation of symptoms with weather or environmental changes or other aggravating or alleviating factors except as outlined above   No unusual exposure hx or h/o childhood pna/ asthma or knowledge of premature birth.  Current Allergies, Complete Past Medical History, Past Surgical History, Family History, and Social History were reviewed in [employer] record.  ROS  The following are not active complaints unless bolded Hoarseness, sore throat, dysphagia, dental problems, itching, sneezing,  nasal congestion or discharge of excess mucus or purulent secretions, ear ache,   fever, chills, sweats, unintended wt loss or wt gain, classically pleuritic or exertional cp,  orthopnea pnd or arm/hand swelling  or leg swelling, presyncope, palpitations, abdominal pain, anorexia, nausea, vomiting, diarrhea  or change in bowel habits or change in bladder habits, change in stools or change in urine, dysuria, hematuria,  rash, arthralgias, visual complaints, headache, numbness, weakness or ataxia or problems with walking or coordination,  change in mood or  memory.        Current Meds  Medication Sig   acetaminophen  (TYLENOL ) 500 MG tablet Take 1,000 mg by mouth every 6 (six) hours as needed for moderate pain.   albuterol  (VENTOLIN  HFA) 108 (90 Base) MCG/ACT inhaler TAKE 2 PUFFS BY MOUTH EVERY 6 HOURS AS NEEDED FOR WHEEZE OR SHORTNESS OF BREATH   amLODipine  (NORVASC ) 5 MG tablet Take 1 tablet (5 mg total) by mouth daily.   BREZTRI  AEROSPHERE 160-9-4.8 MCG/ACT AERO inhaler INHALE 2 PUFFS INTO THE LUNGS 2 (TWO) TIMES DAILY. RINSE MOUTH  DUPIXENT  300 MG/2ML SOAJ Inject into the skin.   EPINEPHrine  0.3 mg/0.3 mL IJ SOAJ injection Inject into thigh for severe allergic reaction   famotidine  (PEPCID ) 20 MG tablet One after supper   montelukast  (SINGULAIR ) 10 MG tablet Take 1 tablet (10 mg total) by mouth at bedtime.   oxyCODONE  (ROXICODONE ) 5 MG immediate release tablet Take 1 tablet (5  mg total) by mouth every 6 (six) hours as needed for severe pain (pain score 7-10).   primidone  (MYSOLINE ) 50 MG tablet TAKE 4 TABLETS IN THE AM, THEN 4 IN THE PM DAILY   [DISCONTINUED] pantoprazole  (PROTONIX ) 40 MG tablet TAKE 1 TABLET (40 MG TOTAL) BY MOUTH DAILY. TAKE 30-60 MIN BEFORE FIRST MEAL OF THE DAY          Past Medical History:  Diagnosis Date   Acute conjunctivitis of right eye 10/07/2014   Acute respiratory failure with hypoxia (HCC) 07/23/2013   Asthma    Asthma    Phreesia 06/26/2020   Asthma, severe persistent 07/16/2010   PFT >> spiro 2012 with FEV1 1.14 (28%), ratio 45 Alpha 1 (08/12/10) >>MM ([facility] )  IGE 1747, positive allergy  profile >> Elevated eos on CBC S/p intubation 10/2010 Job exposure to respiratory irritant - bleach, chicken Allergy  vaccine begun 11/09/2010. DC'd by pt 2013- resumed 2013> 1:50 GH; DC'd [facility] 9/24-9/27/ 2013- Status asthma, no vent. [facility]- status ast   COVID-19 virus infection 06/17/2020   January, 2022. 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   SVT (supraventricular tachycardia) (HCC) 09/24/2014      Objective:    Wts  04/09/2024         192  01/05/2024        189  10/13/2023          185  09/01/2023        176   07/28/2023        177   07/21/2023       170  07/11/23 175 lb (79.4 kg)  06/23/23 175 lb (79.4 kg)  06/04/23 186 lb 15.2 oz (84.8 kg)   Vital signs reviewed  04/09/2024  - Note at rest 02 sats  98% on RA   General appearance:    somber amb bm nad     HEENT : Oropharynx  clear   Nasal turbinates no polyps   NECK :  without  apparent JVD/ palpable Nodes/TM    LUNGS: no  acc muscle use,  Min barrel  contour chest wall with bilateral  slightly decreased bs s audible wheeze and  without cough on insp or exp maneuvers and min  Hyperresonant  to  percussion bilaterally    CV:  RRR  no s3 or murmur or increase in P2, and no edema   ABD:  soft and nontender    MS:  Nl gait/ ext warm without deformities Or obvious joint restrictions  calf tenderness, cyanosis or clubbing     SKIN: warm and dry without lesions    NEURO:  alert, approp, nl sensorium with  no motor or cerebellar deficits apparent.  Assessment     Assessment & Plan Severe persistent asthma without complication (HCC) Stopped smoking  2011 (Truncated 01/05/2024 )  spiro 2012 with FEV1 1.14 (28%), ratio 45 Alpha 1 (08/12/10) >>MM ([facility] )  IGE 1747, positive allergy  profile >> Elevated eos on CBC S/p intubation 10/2010 Job exposure to respiratory irritant - bleach, chicken Allergy  vaccine begun 11/09/2010. DC'd by pt 2013- resumed 2013> 1:50 GH; DC'd [facility] 9/24-9/27/ 2013- Status asthma, no vent. [facility]- status asthma CT 07/11/23 scattered centrilobular emphysema Nucala  therapy begun 2016- dc'd [facility] - Trial of theoph 11/19/13 - 12/10/2013 d/c and rx max gerd rx  Sandoval spirometry 10/09/14- Severe obstructive disease- FVC 2.63/ 52%, FEV1 0.97/ 24%, FEV1/FVC 0.37, FEF25-75% 0.36/ 8% Eosinophils 14% Allergy  profile done 07/17/2014 showed very high total IgE 3283 FENO 03/18/16- 100    Sandoval Spirometry 01/25/17-severe obstructive airways disease. FVC 3.52/68%, FEV1 1.80/43%, ratio or 0.51, FEF 25-75% 0.76/18% Dupixent  stopped p 4 m in July 2024 and worse since off it insurance would only cover half the year  - 07/28/2023  breztri   rx - FENO   07/28/2023    25 on breztr 2bid and prednisone  20 mg daily  - 09/01/2023 continue breztri    - Dupixent  restarted mid May 2025  - 01/05/2024  After extensive coaching inhaler device,  effectiveness =    90% > continue  breztri   Presently All goals of chronic asthma control met including optimal function and elimination of symptoms with minimal need for rescue therapy.  Contingencies discussed in full including contacting this Sandoval immediately if not controlling the symptoms using the rule of two's.     Concerned he is not following ABCD plan which was reviewed again today using feedback method to be sure he understood the specifics of his action plan         Each maintenance medication was reviewed in detail including emphasizing most importantly the difference between maintenance and prns and under what circumstances the prns are to be triggered using an action plan format where appropriate.  Total time for H and P, chart review, counseling, reviewing hfa / neb  device(s) and generating customized AVS unique to this Sandoval visit / same day charting = 36 min          AVS  Patient Instructions  Plan D  = Deltasone  10 mg  = take this when your ABC plan is not working during a flare - Take 2 daily x 5 days until better then 1 daily x 5 day.  Let me know if there is a problem with your medications  - if need my help doing this, please bring all your medications with you next visit   Please schedule a follow up visit in 3 months but call sooner if needed          Ozell America, MD 04/09/2024

## 2024-04-09 NOTE — Patient Instructions (Addendum)
 Plan D  = Deltasone  10 mg  = take this when your ABC plan is not working during a flare - Take 2 daily x 5 days until better then 1 daily x 5 day.  Let me know if there is a problem with your medications  - if need my help doing this, please bring all your medications with you next visit   Please schedule a follow up visit in 3 months but call sooner if needed

## 2024-04-09 NOTE — Assessment & Plan Note (Addendum)
 Stopped smoking  2011 (Truncated 01/05/2024 )  spiro 2012 with FEV1 1.14 (28%), ratio 45 Alpha 1 (08/12/10) >>MM ([employer] )  IGE 1747, positive allergy  profile >> Elevated eos on CBC S/p intubation 10/2010 Job exposure to respiratory irritant - bleach, chicken Allergy  vaccine begun 11/09/2010. DC'd by pt 2013- resumed 2013> 1:50 GH; DC'd -[facility] 9/24-9/27/ 2013- Status asthma, no vent. [facility] 3/ 2015- status asthma CT 07/11/23 scattered centrilobular emphysema Nucala  therapy begun 2016- dc'd [facility] - Trial of theoph 11/19/13 - 12/10/2013 d/c and rx max gerd rx  Office spirometry 10/09/14- Severe obstructive disease- FVC 2.63/ 52%, FEV1 0.97/ 24%, FEV1/FVC 0.37, FEF25-75% 0.36/ 8% Eosinophils 14% Allergy  profile done 07/17/2014 showed very high total IgE 3283 FENO 03/18/16- 100    Office Spirometry 01/25/17-severe obstructive airways disease. FVC 3.52/68%, FEV1 1.80/43%, ratio or 0.51, FEF 25-75% 0.76/18% Dupixent  stopped p 4 m in July 2024 and worse since off it insurance would only cover half the year  - 07/28/2023  breztri   rx - FENO   07/28/2023    25 on breztr 2bid and prednisone  20 mg daily  - 09/01/2023 continue breztri    - Dupixent  restarted mid May 2025  - 01/05/2024  After extensive coaching inhaler device,  effectiveness =    90% > continue breztri   Presently All goals of chronic asthma control met including optimal function and elimination of symptoms with minimal need for rescue therapy.  Contingencies discussed in full including contacting this office immediately if not controlling the symptoms using the rule of two's.     Concerned he is not following ABCD plan which was reviewed again today using feedback method to be sure he understood the specifics of his action plan         Each maintenance medication was reviewed in detail including emphasizing most importantly the difference between maintenance and prns and under what circumstances the prns are to be  triggered using an action plan format where appropriate.  Total time for H and P, chart review, counseling, reviewing hfa / neb  device(s) and generating customized AVS unique to this office visit / same day charting = 36 min

## 2024-04-16 ENCOUNTER — Telehealth: Payer: Self-pay

## 2024-04-16 NOTE — Telephone Encounter (Signed)
 Received fax from [employer] that Dupixent  pa renewal is needed. Submitted a Prior Authorization request to [employer] for DUPIXENT  via CoverMyMeds. Will update once we receive a response.  Key: [id number]

## 2024-04-17 ENCOUNTER — Other Ambulatory Visit: Payer: Self-pay

## 2024-04-17 NOTE — Telephone Encounter (Signed)
 Attempted to call patient for onboarding and prior to sending Rx to [facility] to avoid confusion.   LVM at [phone] requesting he return my call to my direct line [phone].

## 2024-04-18 ENCOUNTER — Other Ambulatory Visit (HOSPITAL_COMMUNITY): Payer: Self-pay

## 2024-04-18 NOTE — Telephone Encounter (Signed)
 Received notification from [employer] regarding a prior authorization for DUPIXENT . Authorization has been APPROVED from 03/18/24 to 04/18/27. Have not yet received approval letter.  Unable to run test claim because it's refill too soon until 05/25/24.  Patient can fill through [facility]: [phone]   Phone # [phone]   Pt was already enrolled in copay card:  BIN: [id number] PCN: [id number] Group: [id number] ID: [id number]

## 2024-04-24 ENCOUNTER — Other Ambulatory Visit: Payer: Self-pay

## 2024-04-29 ENCOUNTER — Other Ambulatory Visit: Payer: Self-pay | Admitting: Internal Medicine

## 2024-05-07 ENCOUNTER — Ambulatory Visit (INDEPENDENT_AMBULATORY_CARE_PROVIDER_SITE_OTHER): Admitting: Otolaryngology

## 2024-05-07 ENCOUNTER — Encounter (INDEPENDENT_AMBULATORY_CARE_PROVIDER_SITE_OTHER): Payer: Self-pay | Admitting: Otolaryngology

## 2024-05-07 ENCOUNTER — Encounter (INDEPENDENT_AMBULATORY_CARE_PROVIDER_SITE_OTHER): Payer: Self-pay

## 2024-05-07 VITALS — BP 148/82 | HR 82 | Ht 75.0 in | Wt 192.0 lb

## 2024-05-07 DIAGNOSIS — K115 Sialolithiasis: Secondary | ICD-10-CM

## 2024-05-07 DIAGNOSIS — K1120 Sialoadenitis, unspecified: Secondary | ICD-10-CM

## 2024-05-07 NOTE — Progress Notes (Signed)
 Otolaryngology Clinic Note HPI:  Drew Sandoval is a 48 y.o. male kindly referred for evaluation of parotitis follow up.   Initial visit (01/2024):  Patient reports that for the past couple of years, he has had recurrent left parotid/cheek swelling, comes on suddenly, mildly tender, but then goes away in a few days. He had recurrence of this a few weeks ago, but did not resolve so went to the ED where he was treated for parotitis and then referred here. He denies skin cancers or lesions. Pain/swelling has improved significantly after abx but continues to feel small knot over left cheek. No prior bx or procedures. No dehydration or recent URI. No DM  --------------------------------------------------------- 03/22/2024 Noted continued recurrent episodes of left parotid/cheek swelling, which is tender. He reports he has gotten antibiotics for this since last visit. Doing well currently.  --------------------------------------------------------- 05/07/2024 Continued recurrent episodes but none over past few weeks. We did go over his CT   PMHx: HTN, Severe Asthma, SVT s/p ablation, Essential tremor, Hyper IgE syndrome  H&N surgery: denies  Personal or FHx of bleeding dz or anesthesia difficulty: no  GLP-1: no AP/AC: no  Tobacco: prior, quit smoking (30 pack year)  Independent Review of Additional Tests or Records:  Labs 01/13/2024: CBC and CMP - WBC 8.1, Eos 2700; BUN/Cr wnl  CT Neck 01/13/2024 independently interpreted: noted parotid enlargement and stranding withut noted abscess, 11mm stone tail of parotid with left parotid duct dilation; noted present even on prior CT (2020 but now appears more distal in the duct closer to parenchyma; reactive appearing b/l LAD CT Neck 04/02/2024 independently interpreted with respect to parotid: 1 cm stone, close to parenchyma of left parotid gland, unchanged it appears; some surrounding inflammation but improved  PMH/Meds/All/SocHx/FamHx/ROS:   Past  Medical History:  Diagnosis Date   Acute conjunctivitis of right eye 10/07/2014   Acute respiratory failure with hypoxia (HCC) 07/23/2013   Asthma    Asthma    [employer] 06/26/2020   Asthma, severe persistent (HCC) 07/16/2010   PFT >> spiro 2012 with FEV1 1.14 (28%), ratio 45 Alpha 1 (08/12/10) >>MM ([employer] )  IGE 1747, positive allergy  profile >> Elevated eos on CBC S/p intubation 10/2010 Job exposure to respiratory irritant - bleach, chicken Allergy  vaccine begun 11/09/2010. DC'd by pt 2013- resumed 2013> 1:50 GH; DC'd [facility] 9/24-9/27/ 2013- Status asthma, no vent. [facility]- status ast   COVID-19 virus infection 06/17/2020   January, 2022. No hosp, no MAB   Eczema, allergic 07/22/2011   Essential tremor 06/27/2020   Hypertension    Seasonal allergies    Seasonal and perennial allergic rhinitis 07/17/2014   FENO 03/18/16- 100  High, indicates allergic asthma   Sinusitis, maxillary, 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   Sinus CT 12/10/2013 >>>    Status asthmaticus 10/19/2010   Sustained SVT 09/24/2014   SVT (supraventricular tachycardia) 09/24/2014     Past Surgical History:  Procedure Laterality Date   None     SVT ABLATION N/A 09/07/2019   Procedure: SVT ABLATION;  Surgeon: Waddell Danelle ORN, MD;  Location: [facility] INVASIVE CV LAB;  Service: Cardiovascular;  Laterality: N/A;    Family History  Problem Relation Age of Onset   Lung cancer Mother    Emphysema Father    Arthritis Sister    Diabetes Maternal Grandmother    Thyroid  disease Neg Hx      Social Connections: Not on file      Current Outpatient Medications:    acetaminophen  (  TYLENOL ) 500 MG tablet, Take 1,000 mg by mouth every 6 (six) hours as needed for moderate pain., Disp: , Rfl:    albuterol  (VENTOLIN  HFA) 108 (90 Base) MCG/ACT inhaler, TAKE 2 PUFFS BY MOUTH EVERY 6 HOURS AS NEEDED FOR WHEEZE OR SHORTNESS OF BREATH, Disp: 8.5 each, Rfl: 2   amLODipine  (NORVASC ) 5 MG tablet, Take 1 tablet (5 mg total)  by mouth daily., Disp: 90 tablet, Rfl: 3   BREZTRI  AEROSPHERE 160-9-4.8 MCG/ACT AERO inhaler, INHALE 2 PUFFS INTO THE LUNGS 2 (TWO) TIMES DAILY. RINSE MOUTH, Disp: 10.7 each, Rfl: 11   DUPIXENT  300 MG/2ML SOAJ, Inject into the skin., Disp: , Rfl:    EPINEPHrine  0.3 mg/0.3 mL IJ SOAJ injection, Inject into thigh for severe allergic reaction, Disp: 1 each, Rfl: prn   famotidine  (PEPCID ) 20 MG tablet, One after supper, Disp: 30 tablet, Rfl: 11   levalbuterol  (XOPENEX ) 0.63 MG/3ML nebulizer solution, Take 3 mLs (0.63 mg total) by nebulization every 4 (four) hours as needed for wheezing or shortness of breath., Disp: , Rfl:    montelukast  (SINGULAIR ) 10 MG tablet, Take 1 tablet (10 mg total) by mouth at bedtime., Disp: 30 tablet, Rfl: 10   oxyCODONE  (ROXICODONE ) 5 MG immediate release tablet, Take 1 tablet (5 mg total) by mouth every 6 (six) hours as needed for severe pain (pain score 7-10)., Disp: 10 tablet, Rfl: 0   primidone  (MYSOLINE ) 50 MG tablet, TAKE 4 TABLETS IN THE AM, THEN 4 IN THE PM DAILY, Disp: 720 tablet, Rfl: 0   Physical Exam:   BP (!) 148/82 (BP Location: Right Arm, Patient Position: Sitting, Cuff Size: Large)   Pulse 82   Ht 6' 3 (1.905 m)   Wt 192 lb (87.1 kg)   SpO2 90%   BMI 24.00 kg/m   Salient findings:  CN II-XII intact Bilateral EAC clear and TM intact with well pneumatized middle ear spaces Anterior rhinoscopy: Septum intact; bilateral inferior turbinates without significantly No lesions of oral cavity/oropharynx; cannot express saliva or purulence left parotid duct, but can from right; no skin lesions; small left more inferior firmness - query stone? No obviously palpable neck masses/lymphadenopathy/thyromegaly No respiratory distress or stridor  Seprately Identifiable Procedures:  Prior to initiating any procedures, risks/benefits/alternatives were explained to the patient and verbal consent obtained. None  Impression & Plans:  Drew Sandoval is a 48 y.o. male  with:  1. Parotid sialoadenitis   2. 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 Persistent symptoms. Given location of stone, would likely need sialendo (stone likely too big or close to parenchyma) v/s parotidectomy approach to retrieve stone. Unfortunately no sialendo instruments here so will refer to [facility]  He is in agreement  See below regarding exact medications prescribed this encounter including dosages and route: No orders of the defined types were placed in this encounter.     Thank you for allowing me the opportunity to care for your patient. Please do not hesitate to contact me should you have any other questions.  Sincerely, Eldora Blanch, MD Otolaryngologist (ENT), [facility] Phone: [phone] Fax: [phone]  05/07/2024, 9:15 AM   MDM:  I have personally spent 32 minutes involved in face-to-face and non-face-to-face activities for this patient on the day of the visit.  Professional time spent excludes any procedures performed but includes the following activities, in addition to those noted in the documentation: preparing to see the patient (review of outside documentation and results),  performing a medically appropriate examination, counseling, ordering tests, documenting in the electronic health record, independently interpreting results (CT)

## 2024-05-16 ENCOUNTER — Ambulatory Visit: Admitting: Neurology

## 2024-05-21 NOTE — Progress Notes (Unsigned)
 "  Virtual Visit Via Video       Consent was obtained for video visit:  Yes.   Answered questions that patient had about telehealth interaction:  Yes.   I discussed the limitations, risks, security and privacy concerns of performing an evaluation and management service by telemedicine. I also discussed with the patient that there may be a patient responsible charge related to this service. The patient expressed understanding and agreed to proceed.  Pt location: Home Physician Location: office Name of referring provider:  No ref. provider found I connected with Drew Sandoval at patients initiation/request on 05/22/2024 at  9:15 AM EST by video enabled telemedicine application and verified that I am speaking with the correct person using two identifiers. Pt MRN:  [id number] Pt DOB:  Nov 11, 1975 Video Participants:  Drew Sandoval;    Assessment/Plan:    1.  Essential Tremor, exacerbated by medication for asthma  -Increase primidone  to 250 mg bid.  He knows that this is max dosage and won't be able to go up further.  - Caffeine is a significant inducer of tremor and him.  He has cut out [facility], but has trouble cutting out coffee, which he admits induces tremor.  -when he ran out of primidone  tremor was much worse   2.  Severe Asthma  -Medications for this contribute to tremor  - Following with Dr. Darlean and doing better now with the regimen he has him on  3.  SVT  -Following with cardiology.  4.  Parotid sialolithiasis  - ENT at [facility] referred him to [facility] salivary disorder clinic  Subjective:   Drew Sandoval was seen today in follow up for essential tremor, which is much exacerbated by medication for his asthma.  He is taking the primidone .  He still has persistent tremor.  Separately from this, he has had parotid sialolithiasis with chronic parotid stone.  ENT here referred him to [facility].  He continues to follow with Dr. Barnett for his asthma and is doing fairly well in that  regard.  He has had no hospitalizations for his asthma since last visit.  He reports that tremor can be good but sometimes he has trouble with drinking liquids - up to 3 days per week.  Mostly caffeine will set off the tremor.  He has cut out [facility], but coffee is an issue.   Current prescribed movement disorder medications: Primidone , 50 mg, 4 in the morning 4 in the evening   PREVIOUS MEDICATIONS: primidone ; artane  (thought caused hand/feet itching); Topamax (not tried because of nephrolithiasis); Cogentin  (discontinued because of sexual side effects; gabapentin  (not helpful)  ALLERGIES:   Allergies  Allergen Reactions   Azithromycin  Rash   Banana Anaphylaxis and Swelling    Throat swelling    Solu-Medrol  [Methylprednisolone ] Itching    States his whole body itches   Omnicef  [Cefdinir ]     Severe whelps   Alvesco  [Ciclesonide ] Rash   Anoro Ellipta  [Umeclidinium-Vilanterol] Rash   Penicillins Rash    CURRENT MEDICATIONS:  Outpatient Encounter Medications as of 05/22/2024  Medication Sig   acetaminophen  (TYLENOL ) 500 MG tablet Take 1,000 mg by mouth every 6 (six) hours as needed for moderate pain.   albuterol  (VENTOLIN  HFA) 108 (90 Base) MCG/ACT inhaler TAKE 2 PUFFS BY MOUTH EVERY 6 HOURS AS NEEDED FOR WHEEZE OR SHORTNESS OF BREATH   amLODipine  (NORVASC ) 5 MG tablet Take 1 tablet (5 mg total) by mouth daily.   BREZTRI  AEROSPHERE 160-9-4.8 MCG/ACT AERO inhaler INHALE 2 PUFFS  INTO THE LUNGS 2 (TWO) TIMES DAILY. RINSE MOUTH   DUPIXENT  300 MG/2ML SOAJ Inject into the skin.   EPINEPHrine  0.3 mg/0.3 mL IJ SOAJ injection Inject into thigh for severe allergic reaction   famotidine  (PEPCID ) 20 MG tablet One after supper   levalbuterol  (XOPENEX ) 0.63 MG/3ML nebulizer solution Take 3 mLs (0.63 mg total) by nebulization every 4 (four) hours as needed for wheezing or shortness of breath.   montelukast  (SINGULAIR ) 10 MG tablet Take 1 tablet (10 mg total) by mouth at bedtime.   oxyCODONE   (ROXICODONE ) 5 MG immediate release tablet Take 1 tablet (5 mg total) by mouth every 6 (six) hours as needed for severe pain (pain score 7-10).   primidone  (MYSOLINE ) 50 MG tablet TAKE 4 TABLETS IN THE AM, THEN 4 IN THE PM DAILY   No facility-administered encounter medications on file as of 05/22/2024.     Objective:    PHYSICAL EXAMINATION:    VITALS:   There were no vitals filed for this visit.    GEN:  The patient appears stated age and is in NAD. HEENT:  Normocephalic, atraumatic.    Neurological examination:  Orientation: The patient is alert and oriented x3. Cranial nerves: There is good facial symmetry. The speech is fluent and clear.  Hearing is intact to conversational tone. Motor: Strength is at least antigravity x 2 (lying in bed in bed cannot see his feet)  Movement examination: Abnormal movements: There is postural and intention tremor, left greater than right  I have reviewed and interpreted the following labs independently   Chemistry      Component Value Date/Time   NA 138 01/13/2024 0935   K 3.7 01/13/2024 0935   CL 104 01/13/2024 0935   CO2 21 (L) 01/13/2024 0935   BUN 14 01/13/2024 0935   CREATININE 0.90 01/13/2024 0935      Component Value Date/Time   CALCIUM 8.9 01/13/2024 0935   ALKPHOS 68 01/13/2024 0935   AST 19 01/13/2024 0935   ALT 16 01/13/2024 0935   BILITOT 0.5 01/13/2024 0935      Lab Results  Component Value Date   WBC 8.1 01/13/2024   HGB 14.0 01/13/2024   HCT 42.9 01/13/2024   MCV 96.0 01/13/2024   PLT 257 01/13/2024   Lab Results  Component Value Date   TSH 0.311 (L) 07/11/2023     Chemistry      Component Value Date/Time   NA 138 01/13/2024 0935   K 3.7 01/13/2024 0935   CL 104 01/13/2024 0935   CO2 21 (L) 01/13/2024 0935   BUN 14 01/13/2024 0935   CREATININE 0.90 01/13/2024 0935      Component Value Date/Time   CALCIUM 8.9 01/13/2024 0935   ALKPHOS 68 01/13/2024 0935   AST 19 01/13/2024 0935   ALT 16  01/13/2024 0935   BILITOT 0.5 01/13/2024 0935     Follow up Instructions      -I discussed the assessment and treatment plan with the patient. The patient was provided an opportunity to ask questions and all were answered. The patient agreed with the plan and demonstrated an understanding of the instructions.   The patient was advised to call back or seek an in-person evaluation if the symptoms worsen or if the condition fails to improve as anticipated.    Drew Rosevear, DO     Cc:  Pcp, No "

## 2024-05-22 ENCOUNTER — Telehealth: Admitting: Neurology

## 2024-05-22 ENCOUNTER — Encounter: Payer: Self-pay | Admitting: Neurology

## 2024-05-22 DIAGNOSIS — G250 Essential tremor: Secondary | ICD-10-CM

## 2024-05-22 DIAGNOSIS — J4550 Severe persistent asthma, uncomplicated: Secondary | ICD-10-CM

## 2024-05-22 DIAGNOSIS — K115 Sialolithiasis: Secondary | ICD-10-CM

## 2024-05-22 MED ORDER — PRIMIDONE 250 MG PO TABS: 250.0000 mg | ORAL_TABLET | Freq: Two times a day (BID) | ORAL | 1 refills | Status: AC

## 2024-05-23 ENCOUNTER — Ambulatory Visit: Admitting: Physician Assistant

## 2024-05-23 VITALS — BP 122/80 | HR 71 | Temp 98.1°F | Ht 75.0 in | Wt 186.6 lb

## 2024-05-23 DIAGNOSIS — I10 Essential (primary) hypertension: Secondary | ICD-10-CM | POA: Diagnosis not present

## 2024-05-23 DIAGNOSIS — Z1211 Encounter for screening for malignant neoplasm of colon: Secondary | ICD-10-CM | POA: Diagnosis not present

## 2024-05-23 NOTE — Assessment & Plan Note (Signed)
 Blood pressure well-controlled with current medication. - Ordered lab work for cholesterol, kidney function, and electrolytes. - Continue current blood pressure medication. - Follow up in six months.

## 2024-05-23 NOTE — Progress Notes (Signed)
 "  New Patient Office Visit  Subjective    Patient ID: Drew Sandoval, male    DOB: Aug 09, 1975  Age: 49 y.o. MRN: [id number]  CC:  Chief Complaint  Patient presents with   Establish Care    Pt needs to add prednisone  10 mg as needed for asthma back onto his medication list No concerns    HPI Drew Sandoval presents to establish care  Discussed the use of AI scribe software for clinical note transcription with the patient, who gave verbal consent to proceed.  History of Present Illness Drew Sandoval is a 49 year old male who presents to establish care. He has asthma, high blood pressure, and a tremor with no current symptoms. In 2022, a thyroid  nodule was evaluated and not felt to be concerning, and he has no current thyroid  symptoms. He follows regularly with neurology and pulmonology. He does not use alcohol, drugs, or tobacco, and he has no family history of colon cancer. He has no concerns or complaints aside from establishing care.     Outpatient Encounter Medications as of 05/23/2024  Medication Sig   acetaminophen  (TYLENOL ) 500 MG tablet Take 1,000 mg by mouth every 6 (six) hours as needed for moderate pain.   albuterol  (VENTOLIN  HFA) 108 (90 Base) MCG/ACT inhaler TAKE 2 PUFFS BY MOUTH EVERY 6 HOURS AS NEEDED FOR WHEEZE OR SHORTNESS OF BREATH   amLODipine  (NORVASC ) 5 MG tablet Take 1 tablet (5 mg total) by mouth daily.   BREZTRI  AEROSPHERE 160-9-4.8 MCG/ACT AERO inhaler INHALE 2 PUFFS INTO THE LUNGS 2 (TWO) TIMES DAILY. RINSE MOUTH   DUPIXENT  300 MG/2ML SOAJ Inject into the skin.   EPINEPHrine  0.3 mg/0.3 mL IJ SOAJ injection Inject into thigh for severe allergic reaction   famotidine  (PEPCID ) 20 MG tablet One after supper   levalbuterol  (XOPENEX ) 0.63 MG/3ML nebulizer solution Take 3 mLs (0.63 mg total) by nebulization every 4 (four) hours as needed for wheezing or shortness of breath.   montelukast  (SINGULAIR ) 10 MG tablet Take 1 tablet (10 mg total) by mouth at bedtime.    predniSONE  (DELTASONE ) 10 MG tablet Take 10 mg by mouth daily as needed (asthma).   primidone  (MYSOLINE ) 250 MG tablet Take 1 tablet (250 mg total) by mouth 2 (two) times daily.   [DISCONTINUED] oxyCODONE  (ROXICODONE ) 5 MG immediate release tablet Take 1 tablet (5 mg total) by mouth every 6 (six) hours as needed for severe pain (pain score 7-10).   No facility-administered encounter medications on file as of 05/23/2024.    Past Medical History:  Diagnosis Date   Acute conjunctivitis of right eye 10/07/2014   Acute respiratory failure with hypoxia (HCC) 07/23/2013   Asthma    Asthma    Phreesia 06/26/2020   Asthma, severe persistent (HCC) 07/16/2010   PFT >> spiro 2012 with FEV1 1.14 (28%), ratio 45 Alpha 1 (08/12/10) >>MM ([facility] )  IGE 1747, positive allergy  profile >> Elevated eos on CBC S/p intubation 10/2010 Job exposure to respiratory irritant - bleach, chicken Allergy  vaccine begun 11/09/2010. DC'd by pt 2013- resumed 2013> 1:50 GH; DC'd [facility] 9/24-9/27/ 2013- Status asthma, no vent. [facility] 3/ 2015- status ast   COVID-19 virus infection 06/17/2020   January, 2022. No hosp, no MAB   Eczema, allergic 07/22/2011   Essential tremor 06/27/2020   Hypertension    Seasonal allergies    Seasonal and perennial allergic rhinitis 07/17/2014   FENO 03/18/16- 100  High, indicates allergic asthma   Sinusitis, maxillary,  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   Sinus CT 12/10/2013 >>>    Status asthmaticus 10/19/2010   Sustained SVT 09/24/2014   SVT (supraventricular tachycardia) 09/24/2014    Past Surgical History:  Procedure Laterality Date   None     SVT ABLATION N/A 09/07/2019   Procedure: SVT ABLATION;  Surgeon: Waddell Danelle ORN, MD;  Location: [facility];  Service: Cardiovascular;  Laterality: N/A;    Family History  Problem Relation Age of Onset   Lung cancer Mother    Emphysema Father    Arthritis Sister    Diabetes Maternal Grandmother    Thyroid  disease Neg Hx      Social History   Socioeconomic History   Marital status: Single    Spouse name: Not on file   Number of children: Not on file   Years of education: Not on file   Highest education level: GED or equivalent  Occupational History   Occupation: Psychologist, Occupational: [employer]  Tobacco Use   Smoking status: Former    Current packs/day: 0.00    Average packs/day: 0.5 packs/day for 11.5 years (5.8 ttl pk-yrs)    Types: Cigarettes    Start date: 10/09/1998    Quit date: 04/09/2010    Years since quitting: 14.1   Smokeless tobacco: Former   Tobacco comments:    started at age 89.  1/2 ppd.    Vaping Use   Vaping status: Never Used  Substance and Sexual Activity   Alcohol use: Yes    Comment: rarely   Drug use: No   Sexual activity: Yes    Birth control/protection: None  Other Topics Concern   Not on file  Social History Narrative   Left handed   Two story home   Drinks caffeine   Social Drivers of Health   Tobacco Use: Medium Risk (05/22/2024)   Patient History    Smoking Tobacco Use: Former    Smokeless Tobacco Use: Former    Passive Exposure: Not on Actuary Strain: Low Risk (05/23/2024)   Overall Financial Resource Strain (CARDIA)    Difficulty of Paying Living Expenses: Not hard at all  Food Insecurity: Food Insecurity Present (05/23/2024)   Epic    Worried About Programme Researcher, Broadcasting/film/video in the Last Year: Never true    Ran Out of Food in the Last Year: Sometimes true  Transportation Needs: No Transportation Needs (05/23/2024)   Epic    Lack of Transportation (Medical): No    Lack of Transportation (Non-Medical): No  Physical Activity: Sufficiently Active (05/23/2024)   Exercise Vital Sign    Days of Exercise per Week: 7 days    Minutes of Exercise per Session: 150+ min  Stress: Stress Concern Present (05/23/2024)   [employer] of Occupational Health - Occupational Stress Questionnaire    Feeling of Stress: Rather much  Social Connections:  Moderately Isolated (05/23/2024)   Social Connection and Isolation Panel    Frequency of Communication with Friends and Family: More than three times a week    Frequency of Social Gatherings with Friends and Family: More than three times a week    Attends Religious Services: More than 4 times per year    Active Member of [employer] or Organizations: No    Attends Banker Meetings: Not on file    Marital Status: Widowed  Intimate Partner Violence: Not At Risk (07/11/2023)   Humiliation, Afraid, Rape, and Kick questionnaire    Fear of Current  or Ex-Partner: No    Emotionally Abused: No    Physically Abused: No    Sexually Abused: No  Depression (PHQ2-9): Medium Risk (05/23/2024)   Depression (PHQ2-9)    PHQ-2 Score: 8  Alcohol Screen: Low Risk (05/23/2024)   Alcohol Screen    Last Alcohol Screening Score (AUDIT): 1  Housing: Low Risk (05/23/2024)   Epic    Unable to Pay for Housing in the Last Year: No    Number of Times Moved in the Last Year: 0    Homeless in the Last Year: No  Utilities: Not At Risk (07/11/2023)   AHC Utilities    Threatened with loss of utilities: No  Health Literacy: Not on file    Review of Systems  Constitutional:  Negative for appetite change, fatigue and fever.  Eyes:  Negative for visual disturbance.  Respiratory:  Negative for cough and shortness of breath.   Cardiovascular:  Negative for chest pain.  Neurological:  Positive for tremors. Negative for light-headedness and headaches.         Objective    BP 122/80 (BP Location: Left Arm, Cuff Size: Normal)   Pulse 71   Temp 98.1 F (36.7 C)   Ht 6' 3 (1.905 m)   Wt 186 lb 9.6 oz (84.6 kg)   SpO2 97%   BMI 23.32 kg/m   Physical Exam Constitutional:      General: He is not in acute distress.    Appearance: Normal appearance. He is normal weight. He is not ill-appearing.  HENT:     Head: Normocephalic and atraumatic.     Mouth/Throat:     Mouth: Mucous membranes are moist.      Pharynx: Oropharynx is clear.  Eyes:     Extraocular Movements: Extraocular movements intact.     Conjunctiva/sclera: Conjunctivae normal.  Cardiovascular:     Rate and Rhythm: Normal rate and regular rhythm.     Heart sounds: Normal heart sounds. No murmur heard. Pulmonary:     Effort: Pulmonary effort is normal.     Breath sounds: Normal breath sounds. No wheezing, rhonchi or rales.  Musculoskeletal:     Right lower leg: No edema.     Left lower leg: No edema.  Skin:    General: Skin is warm and dry.  Neurological:     General: No focal deficit present.     Mental Status: He is alert and oriented to person, place, and time.  Psychiatric:        Mood and Affect: Mood normal.        Behavior: Behavior normal.       Assessment & Plan:  Hypertension, essential Assessment & Plan: Blood pressure well-controlled with current medication. - Ordered lab work for cholesterol, kidney function, and electrolytes. - Continue current blood pressure medication. - Follow up in six months.  Orders: -     Microalbumin / creatinine urine ratio -     CMP14+EGFR -     CBC with Differential/Platelet -     Lipid panel -     Ambulatory referral to Gastroenterology  Screen for colon cancer -     Ambulatory referral to Gastroenterology   Return in about 6 months (around 11/20/2024) for BP.   Anzel Kearse, PA-C  "

## 2024-06-01 LAB — CBC WITH DIFFERENTIAL/PLATELET
Basophils Absolute: 0.2 x10E3/uL (ref 0.0–0.2)
Basos: 2 %
EOS (ABSOLUTE): 1.8 x10E3/uL — ABNORMAL HIGH (ref 0.0–0.4)
Eos: 22 %
Hematocrit: 43.2 % (ref 37.5–51.0)
Hemoglobin: 14.2 g/dL (ref 13.0–17.7)
Immature Grans (Abs): 0 x10E3/uL (ref 0.0–0.1)
Immature Granulocytes: 0 %
Lymphocytes Absolute: 3.3 x10E3/uL — ABNORMAL HIGH (ref 0.7–3.1)
Lymphs: 41 %
MCH: 31.5 pg (ref 26.6–33.0)
MCHC: 32.9 g/dL (ref 31.5–35.7)
MCV: 96 fL (ref 79–97)
Monocytes Absolute: 0.6 x10E3/uL (ref 0.1–0.9)
Monocytes: 8 %
Neutrophils Absolute: 2.2 x10E3/uL (ref 1.4–7.0)
Neutrophils: 27 %
Platelets: 222 x10E3/uL (ref 150–450)
RBC: 4.51 x10E6/uL (ref 4.14–5.80)
RDW: 12.9 % (ref 11.6–15.4)
WBC: 8 x10E3/uL (ref 3.4–10.8)

## 2024-06-01 LAB — MICROALBUMIN / CREATININE URINE RATIO
Creatinine, Urine: 239.6 mg/dL
Microalb/Creat Ratio: 7 mg/g{creat} (ref 0–29)
Microalbumin, Urine: 17.7 ug/mL

## 2024-06-01 LAB — CMP14+EGFR
ALT: 12 IU/L (ref 0–44)
AST: 21 IU/L (ref 0–40)
Albumin: 4 g/dL — ABNORMAL LOW (ref 4.1–5.1)
Alkaline Phosphatase: 79 IU/L (ref 47–123)
BUN/Creatinine Ratio: 14 (ref 9–20)
BUN: 13 mg/dL (ref 6–24)
Bilirubin Total: 0.4 mg/dL (ref 0.0–1.2)
CO2: 21 mmol/L (ref 20–29)
Calcium: 9.1 mg/dL (ref 8.7–10.2)
Chloride: 107 mmol/L — ABNORMAL HIGH (ref 96–106)
Creatinine, Ser: 0.95 mg/dL (ref 0.76–1.27)
Globulin, Total: 4.4 g/dL (ref 1.5–4.5)
Glucose: 95 mg/dL (ref 70–99)
Potassium: 4.1 mmol/L (ref 3.5–5.2)
Sodium: 143 mmol/L (ref 134–144)
Total Protein: 8.4 g/dL (ref 6.0–8.5)
eGFR: 99 mL/min/1.73

## 2024-06-01 LAB — LIPID PANEL
Chol/HDL Ratio: 3.7 ratio (ref 0.0–5.0)
Cholesterol, Total: 191 mg/dL (ref 100–199)
HDL: 52 mg/dL
LDL Chol Calc (NIH): 126 mg/dL — ABNORMAL HIGH (ref 0–99)
Triglycerides: 68 mg/dL (ref 0–149)
VLDL Cholesterol Cal: 13 mg/dL (ref 5–40)

## 2024-06-04 ENCOUNTER — Other Ambulatory Visit: Payer: Self-pay

## 2024-06-05 ENCOUNTER — Other Ambulatory Visit (HOSPITAL_COMMUNITY): Payer: Self-pay

## 2024-06-05 ENCOUNTER — Ambulatory Visit: Payer: Self-pay | Admitting: Physician Assistant

## 2024-06-06 NOTE — Telephone Encounter (Signed)
 Per test claim, next payable claim 07/19/24.   Attempted to call patient at [phone]. We can alert patient of fill option through [facility] and profile Rx to be filled at [facility] for next fill in March.  Unable to reach patient, LVMTCB

## 2024-06-07 ENCOUNTER — Other Ambulatory Visit: Payer: Self-pay

## 2024-06-07 ENCOUNTER — Telehealth: Payer: Self-pay | Admitting: *Deleted

## 2024-06-07 NOTE — Telephone Encounter (Signed)
" °  Procedure: COLONOSCOPY  Estimated body mass index is 23.32 kg/m as calculated from the following:   Height as of 05/23/24: 6' 3 (1.905 m).   Weight as of 05/23/24: 186 lb 9.6 oz (84.6 kg).   Have you had a colonoscopy before?  no  Do you have family history of colon cancer?  no  Do you have a family history of polyps? no  Previous colonoscopy with polyps removed?   Do you have a history colorectal cancer?   no  Are you diabetic?  no  Do you have a prosthetic or mechanical heart valve? no  Do you have a pacemaker/defibrillator?   no  Have you had endocarditis/atrial fibrillation?  no  Do you use supplemental oxygen /CPAP?  no  Have you had joint replacement within the last 12 months?  no  Do you tend to be constipated or have to use laxatives?  no   Do you have history of alcohol use? If yes, how much and how often.  no  Do you have history or are you using drugs? If yes, what do are you  using?  no  Have you ever had a stroke/heart attack?  no  Have you ever had a heart or other vascular stent placed,?no  Do you take weight loss medication? no  Do you take any blood-thinning medications such as: (Plavix, aspirin, Coumadin, Aggrenox, Brilinta, Xarelto, Eliquis, Pradaxa, Savaysa or Effient)? no  If yes we need the name, milligram, dosage and who is prescribing doctor:               Current Outpatient Medications  Medication Sig Dispense Refill   acetaminophen  (TYLENOL ) 500 MG tablet Take 1,000 mg by mouth every 6 (six) hours as needed for moderate pain.     albuterol  (VENTOLIN  HFA) 108 (90 Base) MCG/ACT inhaler TAKE 2 PUFFS BY MOUTH EVERY 6 HOURS AS NEEDED FOR WHEEZE OR SHORTNESS OF BREATH 8.5 each 2   amLODipine  (NORVASC ) 5 MG tablet Take 1 tablet (5 mg total) by mouth daily. 90 tablet 3   BREZTRI  AEROSPHERE 160-9-4.8 MCG/ACT AERO inhaler INHALE 2 PUFFS INTO THE LUNGS 2 (TWO) TIMES DAILY. RINSE MOUTH 10.7 each 11   DUPIXENT  300 MG/2ML SOAJ Inject into the skin.      EPINEPHrine  0.3 mg/0.3 mL IJ SOAJ injection Inject into thigh for severe allergic reaction 1 each prn   famotidine  (PEPCID ) 20 MG tablet One after supper 30 tablet 11   levalbuterol  (XOPENEX ) 0.63 MG/3ML nebulizer solution Take 3 mLs (0.63 mg total) by nebulization every 4 (four) hours as needed for wheezing or shortness of breath.     montelukast  (SINGULAIR ) 10 MG tablet Take 1 tablet (10 mg total) by mouth at bedtime. 30 tablet 10   predniSONE  (DELTASONE ) 10 MG tablet Take 10 mg by mouth daily as needed (asthma).     primidone  (MYSOLINE ) 250 MG tablet Take 1 tablet (250 mg total) by mouth 2 (two) times daily. 180 tablet 1   No current facility-administered medications for this visit.    Allergies Allergen Reactions   Azithromycin  Rash   Banana Anaphylaxis and Swelling    Throat swelling    Solu-Medrol  [Methylprednisolone ] Itching    States his whole body itches   Omnicef  [Cefdinir ]     Severe whelps   Alvesco  [Ciclesonide ] Rash   Anoro Ellipta  [Umeclidinium-Vilanterol] Rash   Penicillins Rash     "

## 2024-06-13 NOTE — Telephone Encounter (Signed)
ASA 2. Appropriate.  ?

## 2024-06-14 NOTE — Telephone Encounter (Signed)
 LMOVM to call back

## 2024-07-09 ENCOUNTER — Ambulatory Visit: Admitting: Internal Medicine

## 2024-11-20 ENCOUNTER — Ambulatory Visit: Admitting: Physician Assistant
